# Patient Record
Sex: Female | Born: 1940 | Race: White | Hispanic: No | Smoking: Never smoker
Health system: Southern US, Community
[De-identification: ages and names within clinical notes are randomized; demographics above are authoritative.]

## PROBLEM LIST (undated history)

## (undated) DIAGNOSIS — E039 Hypothyroidism, unspecified: Secondary | ICD-10-CM

## (undated) DIAGNOSIS — G629 Polyneuropathy, unspecified: Secondary | ICD-10-CM

## (undated) DIAGNOSIS — H9190 Unspecified hearing loss, unspecified ear: Secondary | ICD-10-CM

## (undated) DIAGNOSIS — D649 Anemia, unspecified: Secondary | ICD-10-CM

## (undated) DIAGNOSIS — E7800 Pure hypercholesterolemia, unspecified: Secondary | ICD-10-CM

## (undated) DIAGNOSIS — J939 Pneumothorax, unspecified: Secondary | ICD-10-CM

## (undated) DIAGNOSIS — I890 Lymphedema, not elsewhere classified: Secondary | ICD-10-CM

## (undated) DIAGNOSIS — B084 Enteroviral vesicular stomatitis with exanthem: Secondary | ICD-10-CM

## (undated) DIAGNOSIS — C50919 Malignant neoplasm of unspecified site of unspecified female breast: Secondary | ICD-10-CM

## (undated) DIAGNOSIS — I319 Disease of pericardium, unspecified: Secondary | ICD-10-CM

## (undated) DIAGNOSIS — M8580 Other specified disorders of bone density and structure, unspecified site: Secondary | ICD-10-CM

## (undated) DIAGNOSIS — I2510 Atherosclerotic heart disease of native coronary artery without angina pectoris: Secondary | ICD-10-CM

## (undated) DIAGNOSIS — E538 Deficiency of other specified B group vitamins: Secondary | ICD-10-CM

## (undated) DIAGNOSIS — I219 Acute myocardial infarction, unspecified: Secondary | ICD-10-CM

## (undated) DIAGNOSIS — J42 Unspecified chronic bronchitis: Secondary | ICD-10-CM

## (undated) DIAGNOSIS — H269 Unspecified cataract: Secondary | ICD-10-CM

## (undated) DIAGNOSIS — C801 Malignant (primary) neoplasm, unspecified: Secondary | ICD-10-CM

## (undated) DIAGNOSIS — E7841 Elevated Lipoprotein(a): Secondary | ICD-10-CM

## (undated) DIAGNOSIS — S42401A Unspecified fracture of lower end of right humerus, initial encounter for closed fracture: Secondary | ICD-10-CM

## (undated) DIAGNOSIS — N189 Chronic kidney disease, unspecified: Secondary | ICD-10-CM

## (undated) DIAGNOSIS — E079 Disorder of thyroid, unspecified: Secondary | ICD-10-CM

## (undated) HISTORY — DX: Pure hypercholesterolemia, unspecified: E78.00

## (undated) HISTORY — PX: OTHER SURGICAL HISTORY: SHX169

## (undated) HISTORY — DX: Lymphedema, not elsewhere classified: I89.0

## (undated) HISTORY — DX: Disease of pericardium, unspecified: I31.9

## (undated) HISTORY — DX: Unspecified chronic bronchitis: J42

## (undated) HISTORY — DX: Hypothyroidism, unspecified: E03.9

## (undated) HISTORY — PX: TONSILLECTOMY AND ADENOIDECTOMY: SUR1326

## (undated) HISTORY — DX: Other specified disorders of bone density and structure, unspecified site: M85.80

## (undated) HISTORY — DX: Chronic kidney disease, unspecified: N18.9

## (undated) HISTORY — DX: Pneumothorax, unspecified: J93.9

## (undated) HISTORY — PX: CARDIAC CATHETERIZATION: SHX172

## (undated) HISTORY — DX: Unspecified hearing loss, unspecified ear: H91.90

## (undated) HISTORY — DX: Disorder of thyroid, unspecified: E07.9

## (undated) HISTORY — DX: Unspecified cataract: H26.9

## (undated) HISTORY — DX: Enteroviral vesicular stomatitis with exanthem: B08.4

## (undated) HISTORY — DX: Deficiency of other specified B group vitamins: E53.8

## (undated) HISTORY — DX: Anemia, unspecified: D64.9

## (undated) HISTORY — DX: Malignant neoplasm of unspecified site of unspecified female breast: C50.919

---

## 1997-08-07 ENCOUNTER — Other Ambulatory Visit: Admission: RE | Admit: 1997-08-07 | Discharge: 1997-08-07 | Payer: Self-pay | Admitting: Internal Medicine

## 1997-09-18 ENCOUNTER — Other Ambulatory Visit: Admission: RE | Admit: 1997-09-18 | Discharge: 1997-09-18 | Payer: Self-pay | Admitting: Internal Medicine

## 1997-12-31 ENCOUNTER — Other Ambulatory Visit: Admission: RE | Admit: 1997-12-31 | Discharge: 1997-12-31 | Payer: Self-pay | Admitting: *Deleted

## 1999-01-06 DIAGNOSIS — I319 Disease of pericardium, unspecified: Secondary | ICD-10-CM

## 1999-01-06 DIAGNOSIS — J939 Pneumothorax, unspecified: Secondary | ICD-10-CM

## 1999-01-06 HISTORY — DX: Pneumothorax, unspecified: J93.9

## 1999-01-06 HISTORY — DX: Disease of pericardium, unspecified: I31.9

## 1999-01-07 ENCOUNTER — Observation Stay (HOSPITAL_COMMUNITY): Admission: EM | Admit: 1999-01-07 | Discharge: 1999-01-08 | Payer: Self-pay | Admitting: Emergency Medicine

## 1999-01-07 ENCOUNTER — Encounter: Payer: Self-pay | Admitting: Internal Medicine

## 1999-02-01 ENCOUNTER — Inpatient Hospital Stay (HOSPITAL_COMMUNITY): Admission: EM | Admit: 1999-02-01 | Discharge: 1999-02-05 | Payer: Self-pay | Admitting: Internal Medicine

## 1999-02-01 ENCOUNTER — Encounter: Payer: Self-pay | Admitting: Internal Medicine

## 1999-02-03 ENCOUNTER — Encounter: Payer: Self-pay | Admitting: Internal Medicine

## 1999-03-12 ENCOUNTER — Other Ambulatory Visit: Admission: RE | Admit: 1999-03-12 | Discharge: 1999-03-12 | Payer: Self-pay | Admitting: *Deleted

## 2000-03-14 ENCOUNTER — Other Ambulatory Visit: Admission: RE | Admit: 2000-03-14 | Discharge: 2000-03-14 | Payer: Self-pay | Admitting: *Deleted

## 2000-04-25 ENCOUNTER — Encounter: Admission: RE | Admit: 2000-04-25 | Discharge: 2000-04-25 | Payer: Self-pay | Admitting: *Deleted

## 2001-04-19 ENCOUNTER — Other Ambulatory Visit: Admission: RE | Admit: 2001-04-19 | Discharge: 2001-04-19 | Payer: Self-pay | Admitting: Obstetrics and Gynecology

## 2001-05-17 ENCOUNTER — Encounter: Payer: Self-pay | Admitting: Internal Medicine

## 2001-05-17 ENCOUNTER — Encounter: Admission: RE | Admit: 2001-05-17 | Discharge: 2001-05-17 | Payer: Self-pay | Admitting: Internal Medicine

## 2001-05-23 ENCOUNTER — Encounter: Payer: Self-pay | Admitting: Internal Medicine

## 2001-05-23 ENCOUNTER — Encounter: Admission: RE | Admit: 2001-05-23 | Discharge: 2001-05-23 | Payer: Self-pay | Admitting: Internal Medicine

## 2002-07-02 ENCOUNTER — Other Ambulatory Visit: Admission: RE | Admit: 2002-07-02 | Discharge: 2002-07-02 | Payer: Self-pay | Admitting: Obstetrics and Gynecology

## 2002-07-02 ENCOUNTER — Encounter: Payer: Self-pay | Admitting: Internal Medicine

## 2002-07-02 ENCOUNTER — Encounter: Admission: RE | Admit: 2002-07-02 | Discharge: 2002-07-02 | Payer: Self-pay | Admitting: Internal Medicine

## 2003-06-03 ENCOUNTER — Encounter: Admission: RE | Admit: 2003-06-03 | Discharge: 2003-06-03 | Payer: Self-pay | Admitting: Internal Medicine

## 2003-07-09 ENCOUNTER — Encounter: Admission: RE | Admit: 2003-07-09 | Discharge: 2003-07-09 | Payer: Self-pay | Admitting: Internal Medicine

## 2003-08-13 ENCOUNTER — Other Ambulatory Visit: Admission: RE | Admit: 2003-08-13 | Discharge: 2003-08-13 | Payer: Self-pay | Admitting: Obstetrics and Gynecology

## 2004-01-28 ENCOUNTER — Ambulatory Visit: Payer: Self-pay | Admitting: Internal Medicine

## 2004-02-20 ENCOUNTER — Ambulatory Visit: Payer: Self-pay | Admitting: *Deleted

## 2004-03-31 ENCOUNTER — Ambulatory Visit: Payer: Self-pay | Admitting: Internal Medicine

## 2004-10-01 ENCOUNTER — Encounter: Admission: RE | Admit: 2004-10-01 | Discharge: 2004-10-01 | Payer: Self-pay | Admitting: Internal Medicine

## 2004-10-05 ENCOUNTER — Ambulatory Visit: Payer: Self-pay | Admitting: Internal Medicine

## 2004-10-06 ENCOUNTER — Ambulatory Visit: Payer: Self-pay | Admitting: Internal Medicine

## 2004-10-14 ENCOUNTER — Ambulatory Visit: Payer: Self-pay | Admitting: Internal Medicine

## 2005-01-20 ENCOUNTER — Ambulatory Visit: Payer: Self-pay | Admitting: Internal Medicine

## 2005-01-21 ENCOUNTER — Ambulatory Visit: Payer: Self-pay | Admitting: Internal Medicine

## 2005-01-28 ENCOUNTER — Ambulatory Visit: Payer: Self-pay | Admitting: Gastroenterology

## 2005-02-04 ENCOUNTER — Ambulatory Visit: Payer: Self-pay | Admitting: Gastroenterology

## 2005-02-08 ENCOUNTER — Ambulatory Visit: Payer: Self-pay | Admitting: Gastroenterology

## 2005-02-08 ENCOUNTER — Encounter (INDEPENDENT_AMBULATORY_CARE_PROVIDER_SITE_OTHER): Payer: Self-pay | Admitting: Specialist

## 2005-03-08 ENCOUNTER — Ambulatory Visit: Payer: Self-pay | Admitting: Internal Medicine

## 2005-06-29 ENCOUNTER — Ambulatory Visit: Payer: Self-pay | Admitting: Internal Medicine

## 2005-08-03 ENCOUNTER — Ambulatory Visit: Payer: Self-pay | Admitting: Internal Medicine

## 2006-01-12 ENCOUNTER — Ambulatory Visit: Payer: Self-pay | Admitting: Internal Medicine

## 2006-03-07 DIAGNOSIS — E039 Hypothyroidism, unspecified: Secondary | ICD-10-CM

## 2006-03-07 HISTORY — DX: Hypothyroidism, unspecified: E03.9

## 2006-03-24 ENCOUNTER — Ambulatory Visit: Payer: Self-pay | Admitting: Internal Medicine

## 2006-10-12 ENCOUNTER — Telehealth (INDEPENDENT_AMBULATORY_CARE_PROVIDER_SITE_OTHER): Payer: Self-pay | Admitting: *Deleted

## 2006-10-20 ENCOUNTER — Ambulatory Visit: Payer: Self-pay | Admitting: Internal Medicine

## 2006-10-28 LAB — CONVERTED CEMR LAB
AST: 16 units/L (ref 0–37)
Cholesterol: 239 mg/dL (ref 0–200)
Total CHOL/HDL Ratio: 4.8
Triglycerides: 99 mg/dL (ref 0–149)

## 2006-10-30 ENCOUNTER — Encounter (INDEPENDENT_AMBULATORY_CARE_PROVIDER_SITE_OTHER): Payer: Self-pay | Admitting: *Deleted

## 2006-10-30 ENCOUNTER — Telehealth (INDEPENDENT_AMBULATORY_CARE_PROVIDER_SITE_OTHER): Payer: Self-pay | Admitting: *Deleted

## 2006-10-30 ENCOUNTER — Ambulatory Visit: Payer: Self-pay | Admitting: Internal Medicine

## 2006-10-30 LAB — CONVERTED CEMR LAB
HDL goal, serum: 40 mg/dL
LDL Goal: 160 mg/dL

## 2006-12-26 ENCOUNTER — Ambulatory Visit: Payer: Self-pay | Admitting: Internal Medicine

## 2006-12-26 LAB — CONVERTED CEMR LAB
Direct LDL: 135.4 mg/dL
HDL: 55.7 mg/dL (ref >39.0)
Total Bilirubin: 1 mg/dL (ref 0.3–1.2)
Total CK: 62 units/L (ref 7–177)
Total Protein: 6.9 g/dL (ref 6.0–8.3)
Triglycerides: 88 mg/dL (ref 0–149)

## 2006-12-29 ENCOUNTER — Ambulatory Visit: Payer: Self-pay | Admitting: Internal Medicine

## 2006-12-29 DIAGNOSIS — E782 Mixed hyperlipidemia: Secondary | ICD-10-CM

## 2006-12-29 DIAGNOSIS — E7800 Pure hypercholesterolemia, unspecified: Secondary | ICD-10-CM | POA: Insufficient documentation

## 2007-05-31 ENCOUNTER — Encounter: Payer: Self-pay | Admitting: Internal Medicine

## 2007-10-03 ENCOUNTER — Ambulatory Visit: Payer: Self-pay | Admitting: Internal Medicine

## 2007-10-03 DIAGNOSIS — M25469 Effusion, unspecified knee: Secondary | ICD-10-CM | POA: Insufficient documentation

## 2007-10-03 DIAGNOSIS — S99919A Unspecified injury of unspecified ankle, initial encounter: Secondary | ICD-10-CM

## 2007-10-03 DIAGNOSIS — S8990XA Unspecified injury of unspecified lower leg, initial encounter: Secondary | ICD-10-CM | POA: Insufficient documentation

## 2007-10-03 DIAGNOSIS — S99929A Unspecified injury of unspecified foot, initial encounter: Secondary | ICD-10-CM

## 2007-10-04 ENCOUNTER — Telehealth (INDEPENDENT_AMBULATORY_CARE_PROVIDER_SITE_OTHER): Payer: Self-pay | Admitting: *Deleted

## 2007-11-06 ENCOUNTER — Encounter: Admission: RE | Admit: 2007-11-06 | Discharge: 2007-11-06 | Payer: Self-pay | Admitting: Internal Medicine

## 2008-03-07 ENCOUNTER — Observation Stay (HOSPITAL_COMMUNITY): Admission: EM | Admit: 2008-03-07 | Discharge: 2008-03-08 | Payer: Self-pay | Admitting: Internal Medicine

## 2008-03-07 ENCOUNTER — Encounter: Payer: Self-pay | Admitting: Emergency Medicine

## 2008-03-07 ENCOUNTER — Ambulatory Visit: Payer: Self-pay | Admitting: Interventional Radiology

## 2010-03-27 ENCOUNTER — Encounter: Payer: Self-pay | Admitting: Internal Medicine

## 2010-03-28 ENCOUNTER — Encounter: Payer: Self-pay | Admitting: Internal Medicine

## 2010-06-21 LAB — CBC
HCT: 36.2 % (ref 36.0–46.0)
HCT: 37.2 % (ref 36.0–46.0)
MCHC: 33.3 g/dL (ref 30.0–36.0)
MCV: 94.2 fL (ref 78.0–100.0)
MCV: 96 fL (ref 78.0–100.0)
Platelets: 231 10*3/uL (ref 150–400)
Platelets: 237 10*3/uL (ref 150–400)
Platelets: 248 10*3/uL (ref 150–400)
RDW: 12.6 % (ref 11.5–15.5)
WBC: 8.3 10*3/uL (ref 4.0–10.5)

## 2010-06-21 LAB — BASIC METABOLIC PANEL
BUN: 15 mg/dL (ref 6–23)
BUN: 16 mg/dL (ref 6–23)
CO2: 28 mEq/L (ref 19–32)
Calcium: 9.4 mg/dL (ref 8.4–10.5)
Chloride: 107 mEq/L (ref 96–112)
Creatinine, Ser: 0.62 mg/dL (ref 0.4–1.2)
GFR calc non Af Amer: >60 mL/min (ref >60)
Glucose, Bld: 106 mg/dL — ABNORMAL HIGH (ref 70–99)
Potassium: 3.4 mEq/L — ABNORMAL LOW (ref 3.5–5.1)

## 2010-06-21 LAB — C-REACTIVE PROTEIN: CRP: 0.2 mg/dL — ABNORMAL LOW (ref <0.6)

## 2010-06-21 LAB — DIFFERENTIAL
Basophils Absolute: 0 10*3/uL (ref 0.0–0.1)
Basophils Absolute: 0 10*3/uL (ref 0.0–0.1)
Basophils Relative: 0 % (ref 0–1)
Eosinophils Absolute: 0.1 10*3/uL (ref 0.0–0.7)
Eosinophils Relative: 2 % (ref 0–5)
Lymphocytes Relative: 28 % (ref 12–46)
Lymphocytes Relative: 31 % (ref 12–46)
Lymphs Abs: 2.6 10*3/uL (ref 0.7–4.0)
Monocytes Relative: 7 % (ref 3–12)
Neutro Abs: 4.7 10*3/uL (ref 1.7–7.7)
Neutrophils Relative %: 59 % (ref 43–77)
Neutrophils Relative %: 64 % (ref 43–77)

## 2010-06-21 LAB — COMPREHENSIVE METABOLIC PANEL
Alkaline Phosphatase: 71 U/L (ref 39–117)
BUN: 14 mg/dL (ref 6–23)
CO2: 27 mEq/L (ref 19–32)
Chloride: 103 mEq/L (ref 96–112)
GFR calc non Af Amer: >60 mL/min (ref >60)
Glucose, Bld: 98 mg/dL (ref 70–99)
Potassium: 3.7 mEq/L (ref 3.5–5.1)
Total Bilirubin: 1 mg/dL (ref 0.3–1.2)
Total Protein: 6.1 g/dL (ref 6.0–8.3)

## 2010-06-21 LAB — T4, FREE: Free T4: 1.33 ng/dL (ref 0.89–1.80)

## 2010-06-21 LAB — PROTIME-INR: INR: 0.9 (ref 0.00–1.49)

## 2010-06-21 LAB — SEDIMENTATION RATE: Sed Rate: 7 mm/hr (ref 0–22)

## 2010-06-21 LAB — CK: Total CK: 31 U/L (ref 7–177)

## 2010-06-21 LAB — D-DIMER, QUANTITATIVE: D-Dimer, Quant: 0.33 ug/mL-FEU (ref 0.00–0.48)

## 2010-06-21 LAB — POCT CARDIAC MARKERS

## 2010-06-21 LAB — TROPONIN I: Troponin I: 0.01 ng/mL (ref 0.00–0.06)

## 2010-07-20 NOTE — H&P (Signed)
Jessica [name], MORAD NO.:  [id number]   MEDICAL RECORD NO.:  [id number]          PATIENT TYPE:  INP   LOCATION:                               FACILITY:  [facility]   PHYSICIAN:  Renee Ramus, MD       DATE OF BIRTH:  1940-11-25   DATE OF ADMISSION:  03/07/2008  DATE OF DISCHARGE:  03/08/2008                              HISTORY & PHYSICAL   HISTORY OF PRESENT ILLNESS:  The patient is a 70 year old female who  experienced a brief syncopal episode today lasting less than 2 minutes.  The patient had no preceding symptoms.  The patient did have syncopal  episode approximately 10 years ago when she had episode of pericarditis;  however, at that time the syncopal episodes were associated with cardiac  chest pain.  She denies any pain at this time, denies any associated  diaphoresis, nausea, vomiting, or tachycardia.  She said that it was,  however, accompanied by weakness.  She denies any recent history of  diarrhea or constipation.  She denies any history of dehydration.  She  denies any preceding or prodromal syndromes.  She denies any  exacerbating or relieving factors.  This patient will be admitted for  further evaluation and treatment.   PAST MEDICAL HISTORY:  1. Hypothyroid.  2. History of pericarditis with collapsed lung, now resolved.   SOCIAL HISTORY:  The patient denies tobacco and reports having 1 or 2  glasses or wine week.   FAMILY HISTORY:  Not available.   REVIEW OF SYSTEMS:  All other comprehensive review of systems are  negative.   MEDICATIONS CURRENTLY:  Levothyroxine 112 mcg p.o. daily.   ALLERGIES:  NKDA.   PHYSICAL EXAMINATION:  GENERAL:  This is a well-developed, well-  nourished white female, currently in no apparent distress.  VITAL SIGNS:  Pulse 56, respiratory 12, blood pressure 106/56, 96% sat  on room air, and temperature is 98.  HEENT:  No jugular venous distention or lymphadenopathy.  Oropharynx is  clear.  Mucous membranes pink  and moist.  TMs clear bilaterally.  Pupils  equal and reactive to light and accommodation.  Extraocular muscles are  intact.  CARDIOVASCULAR:  She has a regular rate and rhythm albeit brady, but no  evidence of murmurs, rubs or gallops.  She has normal S1 and S2, and  there is no evidence of pericardial friction rub.  PULMONARY:  Lungs are clear to auscultation bilaterally.  ABDOMEN:  Soft, nontender, nondistended without hepatosplenomegaly.  Bowel sounds are present.  She has no rebound or guarding.  EXTREMITIES:  She has no clubbing, cyanosis or edema.  She has good  peripheral pulses in dorsalis pedis and radial artery.  She is able to  move all extremities.  NEUROLOGIC:  Cranial nerves II through XII are grossly intact.  She has  no focal neurological deficits.   LABORATORY DATA AND STUDIES:  1. EKG is negative.  2. Chest x-ray shows mild hyperinflated lungs, but no evidence of      acute cardiopulmonary disease.  White count 7.48, H and H 12.5 and  36, MCV 94, platelets 231.  Sodium 140, potassium 3.9, chloride      104, bicarb 27, BUN 16, creatinine 0.7, glucose 106, INR 0.9.  CK      and troponin I are negative.   ASSESSMENT AND PLAN:  1. Syncope.  Much concerned about the lack of a prodromal experience      prior to her syncopal episode.  This suggests strongly that this is      some kind of dysrhythmia.  We will recheck EKG.  We will check      echocardiogram.  We will recheck cardiac enzymes.  We will check      orthostatics in a.m., keep the patient on telemetry and observe for      bradydysrhythmia.  We will also check D-dimer, although I believe      that is a low probability.  We will also check BNP, ESR, and CRP to      evaluate for a surreptitious inflammatory infectious state;      however, primary and the differential at this point is a      bradydysrhythmia.  2. Hypothyroid.  We will check TSH and free T4 and continue      levothyroxine currently.  3.  Disposition.  The patient is full code.  H and P was constructed by      reviewing past medical history, conferring with the emergency      medical room physician and reviewing the emergency medical record.   TIME SPENT:  One hour.      Renee Ramus, MD  Electronically Signed     JF/MEDQ  D:  03/07/2008  T:  03/07/2008  Job:  [id number]

## 2010-07-20 NOTE — Discharge Summary (Signed)
[name], KLIEBERT NO.:  [id number]   MEDICAL RECORD NO.:  [id number]          PATIENT TYPE:  INP   LOCATION:  4732                         FACILITY:  [facility]   PHYSICIAN:  Lonia Blood, M.D.DATE OF BIRTH:  16-Jan-1941   DATE OF ADMISSION:  03/07/2008  DATE OF DISCHARGE:  03/08/2008                               DISCHARGE SUMMARY   PRIMARY CARE PHYSICIAN:  Massie Maroon, M.D.   DISCHARGE DIAGNOSES:  1. Syncope.  2. Hypothyroidism.  3. Remote history of pericarditis.  4. Remote history of spontaneous pneumothorax.   DISCHARGE MEDICATIONS:  Synthroid 112 mcg p.o. daily   FOLLOW UP:  The patient is instructed to follow up with Dr. Pearson Grippe in  3-5 days.  She is advised to avoid driving until she follows up with Dr.  Selena Batten and is cleared as per his discretion and the followup  recommendation is made that echocardiogram be considered.  Further a  loop monitor could be considered as well.   PROCEDURES:  None.   CONSULTATIONS:  None.   HISTORY OF PRESENT ILLNESS:  [name] is a very pleasant 70-  year-old female who was in her usual state of health until the day prior  to her admission.  She was attending a party.  She had one standard  glass of wine during the party.  She is not a daily drinker.  After  consuming of her glass of wine, she experienced transient symptom of  nausea.  She sat down and the symptoms improved.  She then stood and was  mingling at the party  when she began to feel nauseous again.  Again she  sat and the symptoms resolved.  A second time she stood from her episode  and began to socialize yet again when her symptoms of nausea recurred.  She then sat down and apparently lost consciousness as appreciated by  the other party attendees.  She was transferred to [facility] emergency  room for evaluation.  If an EKG was done there, it is not available to  me.  I see no evidence of that.  After diagnosis of syncope was made,  the  patient was transferred to [facility] for inpatient stay.   While at [facility], electrolytes had been confirmed to be normal.  Renal  function is normal.  LFTs are normal.  Coagulation studies are normal.  A CBC is unrevealing.  BNP is normal.  Sed rate is 7.  D-dimer is  negative.  Cardiac enzymes have been negative.  CT scan of the head was  done and was unrevealing.  A TSH was accomplished and was 2.8.  Orthostatic blood pressures were obtained and the patient was actually  found to suffer an increase of blood pressure when standing.  The  patient does appear to be mildly dehydrated with a systolic blood  pressure of 90 while lying down and a systolic of 115 when standing.  During the patient's hospital stay, a heart rate dipping as low at 55  has been appreciated.  The patient has been asymptomatic with this  nonetheless.  On  the follow-up from the patient's admission, she has no  complaints whatsoever.  She is requesting that she be allowed to go  home.  It is important to note that she had no chest pain.  No  substernal chest pressure and no shortness of breath or diaphoresis or  palpitations with her symptoms.  It is felt that further inpatient stay  is likely to be unfruitful in evaluating the patient's syncope.  She has  some elements better suggestive of orthostatic syncope.  There is other  evidence suggesting a neurally mediated/vasovagal syncope.  Finally, the  possibility of a sick sinus syndrome/bradycardia associated syncope is  considered that the patient has not had severe bradycardia during her  stay.  The patient is felt to be stable at the present time for  discharge.  She has been warned of signs of unstable angina pectoris or  pulmonary embolism or CVA and advised to return via EMS immediately to  the ER should these symptoms develop.  She is further advised not to  drive until she is cleared by her primary care physician.  She is  advised to follow-up in short course  with Dr. Selena Batten in 3-5 days for  further evaluation to be considered as noted above.      Lonia Blood, M.D.  Electronically Signed     JTM/MEDQ  D:  03/08/2008  T:  03/08/2008  Job:  [id number]   cc:   Massie Maroon, MD

## 2011-11-18 ENCOUNTER — Encounter: Payer: Self-pay | Admitting: Gastroenterology

## 2012-05-09 ENCOUNTER — Other Ambulatory Visit: Payer: Self-pay | Admitting: Internal Medicine

## 2012-05-09 DIAGNOSIS — R531 Weakness: Secondary | ICD-10-CM

## 2012-05-19 ENCOUNTER — Ambulatory Visit
Admission: RE | Admit: 2012-05-19 | Discharge: 2012-05-19 | Disposition: A | Discharge status: Home / Self Care | Payer: Medicare Other | Source: Ambulatory Visit | Attending: Internal Medicine | Admitting: Internal Medicine

## 2012-05-19 DIAGNOSIS — R531 Weakness: Secondary | ICD-10-CM

## 2012-08-07 ENCOUNTER — Ambulatory Visit (INDEPENDENT_AMBULATORY_CARE_PROVIDER_SITE_OTHER): Payer: Medicare Other | Admitting: Neurology

## 2012-08-07 ENCOUNTER — Encounter: Payer: Self-pay | Admitting: Neurology

## 2012-08-07 ENCOUNTER — Other Ambulatory Visit: Payer: Self-pay | Admitting: *Deleted

## 2012-08-07 VITALS — BP 112/78 | HR 62 | Ht 62.0 in | Wt 152.2 lb

## 2012-08-07 DIAGNOSIS — R200 Anesthesia of skin: Secondary | ICD-10-CM | POA: Insufficient documentation

## 2012-08-07 DIAGNOSIS — S99929A Unspecified injury of unspecified foot, initial encounter: Secondary | ICD-10-CM

## 2012-08-07 DIAGNOSIS — E782 Mixed hyperlipidemia: Secondary | ICD-10-CM

## 2012-08-07 DIAGNOSIS — R209 Unspecified disturbances of skin sensation: Secondary | ICD-10-CM

## 2012-08-07 NOTE — Progress Notes (Signed)
History of present illness: Jessica [name] is a 72 years old right-handed Caucasian female, referred by her primary care physician Dr. Pearson Grippe for evaluation of bilateral feet paresthesia   She had a past medical history of hypothyroidism, hyperlipidemia, pericarditis  During her most recent followup with her primary care doctor Dr. Selena Batten she was noticed to have decreased vibratory sensation at toes, she also reported few months history of bilateral toes numbness, intermittent tingling, seems to ascend to her foot sometimes, she denies pain, no weakness, she denies back pain, no shotting pain to bilateral lower extremity, mild unbalanced gait, no bowel bladder incontinence  She denies bilateral upper extremity paresthesia, there was mild low vitamin B12, but I do not have laboratory evaluation, she was put on Metnax, which seems to be helpful, her toes felt more flexible, less rigid,   Review of Systems  Out of a complete 14 system review, the patient complains of only the following symptoms, and all other reviewed systems are negative.   Constitutional:   N/A Cardiovascular:  N/A Ear/Nose/Throat:  N/A Skin: N/A Eyes: N/A Respiratory: N/A Gastroitestinal: N/A    Hematology/Lymphatic:  N/A Endocrine:  N/A Musculoskeletal:N/A Allergy/Immunology: N/A Neurological: numbness, balance Psychiatric:    N/A  PHYSICAL EXAMINATOINS:  Generalized: In no acute distress  Neck: Supple, no carotid bruits   Cardiac: Regular rate rhythm  Pulmonary: Clear to auscultation bilaterally  Musculoskeletal: No deformity  Neurological examination  Mentation: Alert oriented to time, place, history taking, and causual conversation  Cranial nerve II-XII: Pupils were equal round reactive to light extraocular movements were full, visual field were full on confrontational test. facial sensation and strength were normal. hearing was intact to finger rubbing bilaterally. Uvula tongue midline.  head turning and  shoulder shrug and were normal and symmetric.Tongue protrusion into cheek strength was normal.  Motor: normal tone, bulk and strength.  Sensory: mildly decreased fine touch, pinprick to mid foot, preserved vibratory sensation lasting for 5 seconds, and proprioception at toes.  Coordination: Normal finger to nose, heel-to-shin bilaterally there was no truncal ataxia  Gait: Rising up from seated position without assistance, normal stance, without trunk ataxia, moderate stride, good arm swing, smooth turning, able to perform tiptoe, and heel walking without difficulty.   Romberg signs: Negative  Deep tendon reflexes: Brachioradialis 2/2, biceps 2/2, triceps 2/2, patellar 2/2, Achilles 2/2, plantar responses were flexor bilaterally.  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

## 2013-01-06 ENCOUNTER — Ambulatory Visit (INDEPENDENT_AMBULATORY_CARE_PROVIDER_SITE_OTHER): Payer: Medicare Other | Admitting: Family Medicine

## 2013-01-06 VITALS — BP 94/68 | HR 75 | Temp 97.5°F | Resp 16 | Ht 64.0 in | Wt 150.0 lb

## 2013-01-06 DIAGNOSIS — L0291 Cutaneous abscess, unspecified: Secondary | ICD-10-CM

## 2013-01-06 DIAGNOSIS — W5501XA Bitten by cat, initial encounter: Secondary | ICD-10-CM

## 2013-01-06 DIAGNOSIS — T148XXA Other injury of unspecified body region, initial encounter: Secondary | ICD-10-CM

## 2013-01-06 DIAGNOSIS — L0390 Cellulitis, unspecified: Secondary | ICD-10-CM

## 2013-01-06 MED ORDER — AMOXICILLIN-POT CLAVULANATE 875-125 MG PO TABS: 1.0000 | ORAL_TABLET | Freq: Two times a day (BID) | ORAL | Status: DC

## 2013-01-06 NOTE — Progress Notes (Signed)
This chart was scribed for Elvina Sidle, MD by Caryn Bee, Medical Scribe. This patient was seen in Room/bed 2 and the patient's care was started at 8:46 AM.  Subjective:    Patient ID: Jessica [name], female    DOB: 11-28-1940, 72 y.o.   MRN: [id number]  HPI HPI Comments: Jessica [name] is a 72 y.o. female who presents to [facility] complaining of cat bite to her right hand that occurred on Friday morning. Pt states she has associated swelling. She has been soaking her hand in epsom salts. Pt is unsure if her Tetanus is UTD, she will call her PCP and check. She is flying to [place] next Wednesday for her son's wedding. He works in [employer]. 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 She states that she would go back to visit, but not to live.   Review of Systems  Skin: Positive for color change (Erythema) and wound (Cat bite right hand). Negative for rash.   Past Surgical History  Procedure Laterality Date  . Tonsillectomy and adenoidectomy  1952?  Marland Kitchen Birthmark       removed,left leg  . Patella fracture surgery Right 2010    after fall   History   Social History  . Marital Status: Divorced    Spouse Name: N/A    Number of Children: 1  . Years of Education: N/A   Occupational History  . retired    Social History Main Topics  . Smoking status: Never Smoker   . Smokeless tobacco: Not on file  . Alcohol Use: Yes     Comment: occas.  . Drug Use: No  . Sexual Activity: Not on file   Other Topics Concern  . Not on file   Social History Narrative   She lives alone, retired, has one child.  No smoke, drink occasionally   Past Medical History  Diagnosis Date  . High cholesterol   . Thyroid disorder   . Pericarditis 01/1999  . Pneumothorax 01/1999  . Osteopenia   . Vitamin B 12 deficiency    Family History  Problem Relation Age of Onset  . Diabetes Mother   . Asthma  Mother   . Ataxia Father   . Cancer - Cervical Sister   . Thyroid disease Sister   . Diabetes Other    Allergies  Allergen Reactions  . Ansaid [Flurbiprofen]     Stomach upset  . Atorvastatin   . Ibuprofen   . Lorazepam   . Nsaids   . Rosuvastatin         Objective:   Physical Exam  Nursing note and vitals reviewed. Constitutional: She is oriented to person, place, and time. She appears well-developed and well-nourished. No distress.  HENT:  Head: Atraumatic.  Neck: Neck supple.  Neurological: She is alert and oriented to person, place, and time.  Skin: Skin is warm and dry. She is not diaphoretic. There is erythema.  Mild edema  Psychiatric: She has a normal mood and affect. Her behavior is normal.      Assessment & Plan:  There were no encounter diagnoses. Cat bite, initial encounter - Plan: amoxicillin-clavulanate (AUGMENTIN) 875-125 MG per tablet  Cellulitis - Plan: amoxicillin-clavulanate (AUGMENTIN) 875-125 MG per tablet  Signed, Elvina Sidle, MD

## 2013-01-12 ENCOUNTER — Ambulatory Visit (INDEPENDENT_AMBULATORY_CARE_PROVIDER_SITE_OTHER): Payer: Medicare Other | Admitting: Family Medicine

## 2013-01-12 VITALS — BP 118/72 | HR 53 | Temp 97.6°F | Resp 18 | Ht 64.0 in | Wt 151.0 lb

## 2013-01-12 DIAGNOSIS — L0390 Cellulitis, unspecified: Secondary | ICD-10-CM

## 2013-01-12 DIAGNOSIS — T148XXA Other injury of unspecified body region, initial encounter: Secondary | ICD-10-CM

## 2013-01-12 DIAGNOSIS — L0291 Cutaneous abscess, unspecified: Secondary | ICD-10-CM

## 2013-01-12 DIAGNOSIS — W5501XA Bitten by cat, initial encounter: Secondary | ICD-10-CM

## 2013-01-12 MED ORDER — AMOXICILLIN-POT CLAVULANATE 875-125 MG PO TABS: 1.0000 | ORAL_TABLET | Freq: Two times a day (BID) | ORAL | Status: DC

## 2013-01-12 NOTE — Progress Notes (Addendum)
Subjective:    Patient ID: Jessica [name], female    DOB: 01-Feb-1941, 72 y.o.   MRN: [id number]  This chart was scribed for Elvina Sidle, MD by Greggory Stallion, Medical Scribe. This patient's care was started at 9:16 AM.  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 Pt was started on Augmentin and took her last pill 2 days ago. She states it is healing well. Pt is planning on a trip to visit her son in [place] in 3 days. She will take an aspirin before her flight to prevent blood clots from forming on the overseas flight.   Patient Active Problem List   Diagnosis Date Noted  . Numbness 08/07/2012  . JOINT EFFUSION, RIGHT KNEE 10/03/2007  . KNEE INJURY, RIGHT 10/03/2007  . HYPERLIPIDEMIA 12/29/2006   Past Medical History  Diagnosis Date  . High cholesterol   . Thyroid disorder   . Pericarditis 01/1999  . Pneumothorax 01/1999  . Osteopenia   . Vitamin B 12 deficiency    Past Surgical History  Procedure Laterality Date  . Tonsillectomy and adenoidectomy  1952?  Marland Kitchen Birthmark       removed,left leg  . Patella fracture surgery Right 2010    after fall   Allergies  Allergen Reactions  . Ansaid [Flurbiprofen]     Stomach upset  . Atorvastatin   . Ibuprofen   . Lorazepam   . Nsaids   . Rosuvastatin    Prior to Admission medications   Medication Sig Start Date End Date Taking? Authorizing Provider  amoxicillin-clavulanate (AUGMENTIN) 875-125 MG per tablet Take 1 tablet by mouth 2 (two) times daily. 01/06/13  Yes Elvina Sidle, MD  aspirin 81 MG tablet Take 81 mg by mouth daily.   Yes Historical Provider, MD  cholecalciferol (VITAMIN D) 1000 UNITS tablet Take 1,000 Units by mouth daily.   Yes Historical Provider, MD  fish oil-omega-3 fatty acids 1000 MG capsule Take 2 g by mouth daily.   Yes Historical Provider, MD  L-Methylfolate-B6-B12 (METANX PO) Take by  mouth 2 (two) times daily.   Yes Historical Provider, MD  levothyroxine (SYNTHROID, LEVOTHROID) 112 MCG tablet  07/31/12  Yes Historical Provider, MD  Misc Natural Products (OSTEO BI-FLEX JOINT SHIELD PO) Take by mouth as directed.   Yes Historical Provider, MD  Red Yeast Rice 600 MG TABS Take by mouth as directed.   Yes Historical Provider, MD   History   Social History  . Marital Status: Divorced    Spouse Name: N/A    Number of Children: 1  . Years of Education: N/A   Occupational History  . retired    Social History Main Topics  . Smoking status: Never Smoker   . Smokeless tobacco: Not on file  . Alcohol Use: Yes     Comment: occas.  . Drug Use: No  . Sexual Activity: Not on file   Other Topics Concern  . Not on file   Social History Narrative   She lives alone, retired, has one child.  No smoke, drink occasionally     Review of Systems  Skin: Positive for wound (well healing).       Objective:   Physical Exam  Constitutional: She is oriented to person, place, and time. She appears well-developed and well-nourished. No distress.  HENT:  Head: Normocephalic and atraumatic.  Eyes: EOM  are normal.  Neck: Neck supple.  Cardiovascular: Normal rate.   Pulmonary/Chest: Effort normal. No respiratory distress.  Musculoskeletal: Normal range of motion.  Wound completely healed. Minimal erythema. Full ROM of hand.   Neurological: She is alert and oriented to person, place, and time.  Skin: Skin is warm and dry.  Psychiatric: She has a normal mood and affect. Her behavior is normal.    Filed Vitals:   01/12/13 0901  BP: 118/72  Pulse: 53  Temp: 97.6 F (36.4 C)  TempSrc: Oral  Resp: 18  Height: 5\' 4"  (1.626 m)  Weight: 151 lb (68.493 kg)  SpO2: 97%       Assessment & Plan:   Cat bite, initial encounter - Plan: amoxicillin-clavulanate (AUGMENTIN) 875-125 MG per tablet  Cellulitis - Plan: amoxicillin-clavulanate (AUGMENTIN) 875-125 MG per  tablet  Signed, Elvina Sidle, MD

## 2013-02-06 ENCOUNTER — Ambulatory Visit: Payer: Medicare Other | Admitting: Neurology

## 2013-04-05 ENCOUNTER — Encounter: Payer: Self-pay | Admitting: Neurology

## 2013-04-05 ENCOUNTER — Ambulatory Visit (INDEPENDENT_AMBULATORY_CARE_PROVIDER_SITE_OTHER): Payer: Medicare Other | Admitting: Neurology

## 2013-04-05 ENCOUNTER — Encounter (INDEPENDENT_AMBULATORY_CARE_PROVIDER_SITE_OTHER): Payer: Self-pay

## 2013-04-05 VITALS — BP 98/65 | HR 61 | Ht 62.0 in | Wt 154.0 lb

## 2013-04-05 DIAGNOSIS — E782 Mixed hyperlipidemia: Secondary | ICD-10-CM

## 2013-04-05 DIAGNOSIS — R200 Anesthesia of skin: Secondary | ICD-10-CM

## 2013-04-05 DIAGNOSIS — R209 Unspecified disturbances of skin sensation: Secondary | ICD-10-CM

## 2013-04-05 NOTE — Progress Notes (Signed)
History of present illness: Jessica [name] is a 73 years old right-handed Caucasian female, referred by her primary care physician Dr. Jani Gravel for evaluation of bilateral feet paresthesia   She had a past medical history of hypothyroidism, hyperlipidemia, pericarditis  During her most recent followup with her primary care doctor Dr. Maudie Mercury in Jan 2014, she was noticed to have decreased vibratory sensation at toes, she also reported few months history of bilateral toes numbness, intermittent tingling, seems to ascend to her foot sometimes, she denies pain, no weakness, she denies back pain, no shotting pain to bilateral lower extremity, mild unbalanced gait, no bowel bladder incontinence  She denies bilateral upper extremity paresthesia, there was mild low vitamin B12, but I do not have laboratory evaluation, she was put on Metnax, which seems to be helpful, her toes felt more flexible, less rigid.  UPDATE Apr 05 2013:  She stopped taking Metnax for a while, her symptoms returned, she denied gait difficulty  Review of Systems  Out of a complete 14 system review, the patient complains of only the following symptoms, and all other reviewed systems are negative.   PHYSICAL EXAMINATOINS:  Generalized: In no acute distress  Neck: Supple, no carotid bruits   Cardiac: Regular rate rhythm  Pulmonary: Clear to auscultation bilaterally  Musculoskeletal: No deformity  Neurological examination  Mentation: Alert oriented to time, place, history taking, and causual conversation  Cranial nerve II-XII: Pupils were equal round reactive to light extraocular movements were full, visual field were full on confrontational test. facial sensation and strength were normal. hearing was intact to finger rubbing bilaterally. Uvula tongue midline.  head turning and shoulder shrug and were normal and symmetric.Tongue protrusion into cheek strength was normal.  Motor: normal tone, bulk and strength.  Sensory: mildly  decreased fine touch, pinprick to mid foot, preserved vibratory sensation and proprioception at toes.  Coordination: Normal finger to nose, heel-to-shin bilaterally there was no truncal ataxia  Gait: Rising up from seated position without assistance, normal stance, without trunk ataxia, moderate stride, good arm swing, smooth turning, able to perform tiptoe, and heel walking without difficulty.   Romberg signs: Negative  Deep tendon reflexes: Brachioradialis 2/2, biceps 2/2, triceps 2/2, patellar 2/2, Achilles 2/2, plantar responses were flexor bilaterally.  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 Continue Mentax 3. RTC prn

## 2013-04-18 ENCOUNTER — Ambulatory Visit (INDEPENDENT_AMBULATORY_CARE_PROVIDER_SITE_OTHER): Payer: Medicare Other | Admitting: Family Medicine

## 2013-04-18 ENCOUNTER — Encounter: Payer: Self-pay | Admitting: Family Medicine

## 2013-04-18 ENCOUNTER — Telehealth: Payer: Self-pay | Admitting: Neurology

## 2013-04-18 VITALS — BP 116/76 | HR 56 | Temp 97.8°F | Resp 16 | Ht 64.0 in | Wt 152.6 lb

## 2013-04-18 DIAGNOSIS — Z1211 Encounter for screening for malignant neoplasm of colon: Secondary | ICD-10-CM

## 2013-04-18 DIAGNOSIS — R531 Weakness: Secondary | ICD-10-CM

## 2013-04-18 DIAGNOSIS — R5383 Other fatigue: Secondary | ICD-10-CM

## 2013-04-18 DIAGNOSIS — G609 Hereditary and idiopathic neuropathy, unspecified: Secondary | ICD-10-CM

## 2013-04-18 DIAGNOSIS — G5793 Unspecified mononeuropathy of bilateral lower limbs: Secondary | ICD-10-CM

## 2013-04-18 DIAGNOSIS — E039 Hypothyroidism, unspecified: Secondary | ICD-10-CM

## 2013-04-18 DIAGNOSIS — Z1212 Encounter for screening for malignant neoplasm of rectum: Secondary | ICD-10-CM

## 2013-04-18 DIAGNOSIS — R5381 Other malaise: Secondary | ICD-10-CM

## 2013-04-18 LAB — BASIC METABOLIC PANEL WITH GFR
BUN: 15 mg/dL (ref 6–23)
CO2: 27 meq/L (ref 19–32)
Calcium: 9.1 mg/dL (ref 8.4–10.5)
Chloride: 105 meq/L (ref 96–112)
Creat: 0.59 mg/dL (ref 0.50–1.10)
Glucose, Bld: 83 mg/dL (ref 70–99)
Potassium: 4.7 meq/L (ref 3.5–5.3)
Sodium: 141 meq/L (ref 135–145)

## 2013-04-18 LAB — CBC WITH DIFFERENTIAL/PLATELET
Basophils Absolute: 0.1 K/uL (ref 0.0–0.1)
Basophils Relative: 1 % (ref 0–1)
Eosinophils Absolute: 0.2 K/uL (ref 0.0–0.7)
Eosinophils Relative: 2 % (ref 0–5)
HCT: 36.5 % (ref 36.0–46.0)
Hemoglobin: 12.1 g/dL (ref 12.0–15.0)
Lymphocytes Relative: 35 % (ref 12–46)
Lymphs Abs: 3 K/uL (ref 0.7–4.0)
MCH: 29.9 pg (ref 26.0–34.0)
MCHC: 33.2 g/dL (ref 30.0–36.0)
MCV: 90.1 fL (ref 78.0–100.0)
Monocytes Absolute: 0.6 K/uL (ref 0.1–1.0)
Monocytes Relative: 7 % (ref 3–12)
Neutro Abs: 4.7 K/uL (ref 1.7–7.7)
Neutrophils Relative %: 55 % (ref 43–77)
Platelets: 309 K/uL (ref 150–400)
RBC: 4.05 MIL/uL (ref 3.87–5.11)
RDW: 14.7 % (ref 11.5–15.5)
WBC: 8.5 K/uL (ref 4.0–10.5)

## 2013-04-18 NOTE — Telephone Encounter (Signed)
Pt called this morning and asked for her BP reading from her visit on 04-05-13 with Dr. Krista Blue.  She is going to see a new doctor at 11:00 am today and would like to give that doctor the information.  She asked if she could receive a call back as soon as possible since her appointment is today.  Thank you

## 2013-04-18 NOTE — Telephone Encounter (Signed)
Called and shared B/P readings from last OV notes with patient and mailed LOV notes per patient's request.

## 2013-04-18 NOTE — Patient Instructions (Addendum)
Dehydration, Elderly Dehydration is when you lose more fluids from the body than you take in. Vital organs such as the kidneys, brain, and heart cannot function without a proper amount of fluids and salt. Any loss of fluids from the body can cause dehydration.  Older adults are at a higher risk of dehydration than younger adults. As we age, our bodies are less able to conserve water and do not respond to temperature changes as well. Also, older adults do not become thirsty as easily or quickly. Because of this, older adults often do not realize they need to increase fluids to avoid dehydration.  CAUSES   Vomiting.  Diarrhea.  Excessive sweating.  Excessive urination.  Fever.  Certain medicines, such as blood pressure medicines called diuretics.  Poorly controlled blood sugars. SIGNS AND SYMPTOMS  Mild dehydration:  Thirst.  Dry lips.  Slightly dry mouth. Moderate dehydration:  Very dry mouth.  Sunken eyes.  Skin does not bounce back quickly when lightly pinched and released.  Dark urine and decreased urine production.  Decreased tear production.  Headache. Severe dehydration:  Very dry mouth.  Extreme thirst.  Rapid, weak pulse (more than 100 beats per minute at rest).  Cold hands and feet.  Not able to sweat in spite of heat.  Rapid breathing.  Blue lips.  Confusion and lethargy.  Difficulty being awakened.  Minimal urine production.  No tears. DIAGNOSIS  Your health care provider will diagnose dehydration based on your symptoms and your exam. Blood and urine tests will help confirm the diagnosis. The diagnostic evaluation should also identify the cause of dehydration. TREATMENT  Treatment of mild or moderate dehydration can often be done at home by increasing the amount of fluids that you drink. It is best to drink small amounts of fluid more often. Drinking too much at one time can make vomiting worse. Severe dehydration needs to be treated at the  hospital. You may be given IV fluids that contain water and electrolytes. HOME CARE INSTRUCTIONS   Ask your health care provider about specific rehydration instructions.  Drink enough fluids to keep your urine clear or pale yellow.  Drink small amounts frequently if you have nausea and vomiting.  Eat as you normally do.  Avoid:  Foods or drinks high in sugar.  Carbonated drinks.  Juice.  Extremely hot or cold fluids.  Drinks with caffeine.  Fatty, greasy foods.  Alcohol.  Tobacco.  Overeating.  Gelatin desserts.  Wash your hands well to avoid spreading bacteria and viruses.  Only take over-the-counter or prescription medicines for pain, discomfort, or fever as directed by your health care provider.  Ask your health care provider if you should continue all prescribed and over-the-counter medicines.  Keep all follow-up appointments with your health care provider. SEEK MEDICAL CARE IF:  You have abdominal pain, and it increases or stays in one area (localizes).  You have a rash, stiff neck, or severe headache.  You are irritable, sleepy, or difficult to awaken.  You are weak, dizzy, or extremely thirsty. SEEK IMMEDIATE MEDICAL CARE IF:   You are unable to keep fluids down, or you get worse despite treatment.  You have frequent episodes of vomiting or diarrhea.  You have blood or green matter (bile) in your vomit.  You have blood in your stool, or your stool looks black and tarry.  You have not urinated in 6 8 hours, or you have only urinated a small amount of very dark urine.  You have a  fever.  You faint. MAKE SURE YOU:   Understand these instructions.  Will watch your condition.  Will get help right away if you are not doing well or get worse. Document Released: 05/14/2003 Document Revised: 12/12/2012 Document Reviewed: 10/29/2012 [facility] Patient Information 2014 [place].   We discussed improving amount of fluids that you consume.  Always drink 8 ounces of water (flavored is ok) with your meals. Drink at least 4 ounces of water or a liquid with your medications. Limit caffeine and sugary drinks as well as artificial sweeteners.  Take your thyroid medication by itself.

## 2013-04-19 ENCOUNTER — Other Ambulatory Visit: Payer: Self-pay | Admitting: Family Medicine

## 2013-04-19 LAB — TSH: TSH: 1.973 u[IU]/mL (ref 0.350–4.500)

## 2013-04-19 LAB — VITAMIN B12: Vitamin B-12: >2000 pg/mL — ABNORMAL HIGH (ref 211–911)

## 2013-04-19 LAB — T3, FREE: T3 FREE: 2.6 pg/mL (ref 2.3–4.2)

## 2013-04-19 LAB — T4, FREE: FREE T4: 1.62 ng/dL (ref 0.80–1.80)

## 2013-04-21 NOTE — Progress Notes (Signed)
S:  This 73 y.o. Cauc female is here for follow-up re: hypothyroidism and persistent mild subjective weakness. She has been evaluated at [facility] (last visit 04/05/2013) by Dr. Krista Blue for lower extremity peripheral neuropathy; pt states symptoms are very mild. Exam essentially normal; current medication- Mentax- seems to be effective. Pt 's symptoms do not interfere in any significant way w/ her ADLs.   Pt has copies of labs from previous medical practice, most recently drawn Oct 2014:  Total cholesterol= 205  HDL= 55  LDL= 131  TGs= 96   TSH= 1.930   Vit D25,OH= 49.8 (All other labs scanned into EPIC).  HCM:  Pt is due for colonoscopy; she requests Dr. Cristina Gong. Denies any symptoms of colorectal disease.  Patient Active Problem List   Diagnosis Date Noted  . Numbness 08/07/2012  . JOINT EFFUSION, RIGHT KNEE 10/03/2007  . KNEE INJURY, RIGHT 10/03/2007  . HYPERLIPIDEMIA 12/29/2006    Prior to Admission medications   Medication Sig Start Date End Date Taking? Authorizing Provider  aspirin 81 MG tablet Take 81 mg by mouth daily.   Yes Historical Provider, MD  cholecalciferol (VITAMIN D) 1000 UNITS tablet Take 1,000 Units by mouth daily.   Yes Historical Provider, MD  fish oil-omega-3 fatty acids 1000 MG capsule Take 2 g by mouth daily.   Yes Historical Provider, MD  L-Methylfolate-B6-B12 (METANX PO) Take by mouth 2 (two) times daily.   Yes Historical Provider, MD  levothyroxine (SYNTHROID, LEVOTHROID) 112 MCG tablet  07/31/12  Yes Historical Provider, MD  Misc Natural Products (OSTEO BI-FLEX JOINT SHIELD PO) Take by mouth as directed.   Yes Historical Provider, MD  Red Yeast Rice 600 MG TABS Take by mouth as directed.   Yes Historical Provider, MD   PMHx, Surg Hx, Soc and Fam Hx reviewed.  ROS: As per HPI; negative for fatigue, diaphoresis, abnormal weight or appetite change, vision disturbances, CP or tightness, palpitations, SOB or DOE, cough, edema, GI or GU problems,  myalgias/arthralgias, rashes or abnormal bruising, HA, dizziness, lightheadedness, Tremor, gait disturbance, abnormal behavior, dysphoric mood, anxiety, confusion or sleep disturbance.  O: Filed Vitals:   04/18/13 1137  BP: 116/76  Pulse: 56  Temp: 97.8 F (36.6 C)  Resp: 16   GEN: In NAD; WN,WD. HENT: [place]/AT; EOMI w/ clear conj/sclerae. EACs/TMs normal. Nares and septum normal. Oroph and dentition unremarkable. NECK: Supple w/o LAN or TMG. COR: RRR. Normal S1 and S2. No m/g/r. LUNGS: CTA; normal resp rate and effort. SKIN: W&D; intact w/o diaphoresis, erythema, rashes or pallor. NEURO: A&O x 3; CNs intact. Motor and sensory function grossly normal. Nonfocal. PSYCH: Pleasant, calm and alert. Speech pattern and thought content normal. Judgement and cognition unimpaired.  A/P: Unspecified hypothyroidism - Plan: T3, Free, T4, Free, TSH  Weakness - Plan: Basic metabolic panel, CBC with Differential  Neuropathy of both feet - Plan: Basic metabolic panel, Vitamin A41  Screening for colorectal cancer - Plan: Ambulatory referral to Gastroenterology  Discussed maintaining adequate hydration; pt states that she does not consume enough liquids daily and will make a conscious effort to correct this.

## 2013-04-21 NOTE — Progress Notes (Signed)
Quick Note:  Please advise pt regarding following labs...  All lab results are normal except Vitamin B12 is above 2000. Consider taking a little less B12 supplement (take 1 tablet on Saturday and Sunday).  Copy to pt. ______

## 2013-04-22 ENCOUNTER — Encounter: Payer: Self-pay | Admitting: *Deleted

## 2013-04-29 ENCOUNTER — Encounter: Payer: Self-pay | Admitting: Gastroenterology

## 2013-05-08 ENCOUNTER — Ambulatory Visit (AMBULATORY_SURGERY_CENTER): Payer: Self-pay | Admitting: *Deleted

## 2013-05-08 VITALS — Ht 64.5 in | Wt 156.4 lb

## 2013-05-08 DIAGNOSIS — Z86010 Personal history of colonic polyps: Secondary | ICD-10-CM

## 2013-05-08 MED ORDER — MOVIPREP 100 G PO SOLR: ORAL | Status: DC

## 2013-05-08 NOTE — Progress Notes (Signed)
No allergies to eggs or soy. No problems with anesthesia.  

## 2013-05-09 ENCOUNTER — Encounter: Payer: Self-pay | Admitting: Gastroenterology

## 2013-05-15 ENCOUNTER — Encounter: Payer: Self-pay | Admitting: Family Medicine

## 2013-05-22 ENCOUNTER — Encounter: Payer: Medicare Other | Admitting: Gastroenterology

## 2013-07-02 ENCOUNTER — Telehealth: Payer: Self-pay

## 2013-07-02 NOTE — Telephone Encounter (Signed)
Spoke to patient.  Advised her to come to office to sign a release of medical information.  As far as refills, I believe she will need an OV before we can do this.  Sent to Dr. Leward Quan to advise.

## 2013-07-02 NOTE — Telephone Encounter (Signed)
Pt is scheduled to follow-up w/ me in May. I am going to have to decline any more Medicare pts. My understanding is that we are closed to Medicare but I continue to have new Medicare pts in my schedule.

## 2013-07-02 NOTE — Telephone Encounter (Signed)
PT STATES SHE IS SWITCHING OVER TO [facility] FOR HER PCP, THE PHARMACY IS GOING TO BE REQUESTING A REFILL ON HER MEDICINES AND ALSO NEED [place] TO REQUEST HER RECORDS FROM Jani Gravel MD. HIS PHONE NUMBER IS [phone] AND THE FAX IS [phone]. YOU MAY REACH PT AT [phone] IF NEEDED

## 2013-07-03 NOTE — Telephone Encounter (Signed)
I have discussed this with Maudie Mercury, and she has told me as long as the patient has been seen at the office at some point they can make appointment. I think this needs to be clarified with your scheduling staff. Being seen once for a cat bite (as patient was) does not make her an established patient, but to our scheduling staff, it does.

## 2013-07-03 NOTE — Telephone Encounter (Signed)
New patient scheduling is an issue as a whole. We need to continue to clarify policies and procedures.

## 2013-07-24 ENCOUNTER — Ambulatory Visit (INDEPENDENT_AMBULATORY_CARE_PROVIDER_SITE_OTHER): Payer: Medicare Other | Admitting: Family Medicine

## 2013-07-24 ENCOUNTER — Encounter: Payer: Self-pay | Admitting: Family Medicine

## 2013-07-24 ENCOUNTER — Other Ambulatory Visit: Payer: Self-pay | Admitting: Family Medicine

## 2013-07-24 VITALS — BP 122/68 | HR 57 | Temp 97.6°F | Resp 16 | Ht 63.5 in | Wt 150.8 lb

## 2013-07-24 DIAGNOSIS — R3915 Urgency of urination: Secondary | ICD-10-CM

## 2013-07-24 DIAGNOSIS — E039 Hypothyroidism, unspecified: Secondary | ICD-10-CM | POA: Insufficient documentation

## 2013-07-24 DIAGNOSIS — R0600 Dyspnea, unspecified: Secondary | ICD-10-CM

## 2013-07-24 DIAGNOSIS — R001 Bradycardia, unspecified: Secondary | ICD-10-CM | POA: Insufficient documentation

## 2013-07-24 DIAGNOSIS — R0609 Other forms of dyspnea: Secondary | ICD-10-CM

## 2013-07-24 DIAGNOSIS — R0989 Other specified symptoms and signs involving the circulatory and respiratory systems: Secondary | ICD-10-CM

## 2013-07-24 DIAGNOSIS — R0689 Other abnormalities of breathing: Secondary | ICD-10-CM

## 2013-07-24 DIAGNOSIS — Z139 Encounter for screening, unspecified: Secondary | ICD-10-CM

## 2013-07-24 DIAGNOSIS — E782 Mixed hyperlipidemia: Secondary | ICD-10-CM

## 2013-07-24 DIAGNOSIS — I498 Other specified cardiac arrhythmias: Secondary | ICD-10-CM

## 2013-07-24 DIAGNOSIS — Z0000 Encounter for general adult medical examination without abnormal findings: Secondary | ICD-10-CM

## 2013-07-24 DIAGNOSIS — R5381 Other malaise: Secondary | ICD-10-CM

## 2013-07-24 DIAGNOSIS — R5383 Other fatigue: Secondary | ICD-10-CM

## 2013-07-24 MED ORDER — AMOXICILLIN 500 MG PO CAPS: ORAL_CAPSULE | ORAL | Status: DC

## 2013-07-24 MED ORDER — AMOXICILLIN 500 MG PO CAPS: 500.0000 mg | ORAL_CAPSULE | ORAL | Status: DC | PRN

## 2013-07-24 NOTE — Patient Instructions (Signed)
Keeping You Healthy  Get These Tests  Blood Pressure- Have your blood pressure checked by your healthcare provider at least once a year.  Normal blood pressure is 120/80.  Weight- Have your body mass index (BMI) calculated to screen for obesity.  BMI is a measure of body fat based on height and weight.  You can calculate your own BMI at [employer].it  Cholesterol- Have your cholesterol checked every year.  Diabetes- Have your blood sugar checked every year if you have high blood pressure, high cholesterol, a family history of diabetes or if you are overweight.  Pap Smear- Have a pap smear every 1 to 3 years if you have been sexually active.  If you are older than 65 and recent pap smears have been normal you may not need additional pap smears.  In addition, if you have had a hysterectomy  For benign disease additional pap smears are not necessary.  Mammogram-Yearly mammograms are essential for early detection of breast cancer  Screening for Colon Cancer- Colonoscopy starting at age 65. Screening may begin sooner depending on your family history and other health conditions.  Follow up colonoscopy as directed by your Gastroenterologist.  Screening for Osteoporosis- Screening begins at age 73 with bone density scanning, sooner if you are at higher risk for developing Osteoporosis.  Get these medicines  Calcium with Vitamin D- Your body requires 1200-1500 mg of Calcium a day and [phone] IU of Vitamin D a day.  You can only absorb 500 mg of Calcium at a time therefore Calcium must be taken in 2 or 3 separate doses throughout the day.  Hormones- Hormone therapy has been associated with increased risk for certain cancers and heart disease.  Talk to your healthcare provider about if you need relief from menopausal symptoms.  Aspirin- Ask your healthcare provider about taking Aspirin to prevent Heart Disease and Stroke.  Get these Immuniztions  Flu shot- Every fall  Pneumonia  shot- Once after the age of 50; if you are younger ask your healthcare provider if you need a pneumonia shot.  Tetanus- Every ten years.   Zostavax- Once after the age of 6 to prevent shingles.  Take these steps  Don't smoke- Your healthcare provider can help you quit. For tips on how to quit, ask your healthcare provider or go to www.smokefree.gov or call 1-800 QUIT-NOW.  Be physically active- Exercise 5 days a week for a minimum of 30 minutes.  If you are not already physically active, start slow and gradually work up to 30 minutes of moderate physical activity.  Try walking, dancing, bike riding, swimming, etc.  Eat a healthy diet- Eat a variety of healthy foods such as fruits, vegetables, whole grains, low fat milk, low fat cheeses, yogurt, lean meats, chicken, fish, eggs, dried beans, tofu, etc.  For more information go to www.thenutritionsource.org  Dental visit- Brush and floss teeth twice daily; visit your dentist twice a year.  Eye exam- Visit your Optometrist or Ophthalmologist yearly.  Drink alcohol in moderation- Limit alcohol intake to one drink or less a day.  Never drink and drive.  Depression- Your emotional health is as important as your physical health.  If you're feeling down or losing interest in things you normally enjoy, please talk to your healthcare provider.  Seat Belts- can save your life; always wear one  Smoke/Carbon Monoxide detectors- These detectors need to be installed on the appropriate level of your home.  Replace batteries at least once a year.  Violence- If  anyone is threatening or hurting you, please tell your healthcare provider.  Living Will/ Health care power of attorney- Discuss with your healthcare provider and family.    Colorectal Cancer Screening Colorectal cancer screening is done to detect early disease. Colorectal refers to the colon and rectum. The colon and rectum are located at the end of the large intestine (digestive system), and  carry your bowel movements out of the body. Screening may be done even if you are not experiencing symptoms.  Colorectal cancer screening checks for:  Polyps. These are small growths in the lining of the colon that can turn cancerous.  Cancer that is already growing. Cancer is a cluster of abnormal cells that can cause problems in the body. REASONS FOR COLORECTAL CANCER SCREENING  It is common for polyps to form in the lining of the colon, especially in older people. These polyps can be cancerous or become cancerous.  Caught early, colorectal cancer is treatable.  Cancer can be life threatening. Detecting or preventing cancer early can save your life and allow you to enjoy life longer. TYPES OF SCREENING  Fecal occult blood testing. A stool sample is examined for blood in the laboratory.  Sigmoidoscopy. A sigmoidoscope is used to examine the rectum and lower colon. A sigmoidoscope is a flexible tube with a camera that is inserted through your anus to examine your lower rectum.  Colonoscopy. The longer colonoscope is used to examine the entire colon. A colonoscope is also a thin, flexible tube with a camera. This test examines the colon and rectum. Other tests include:  Digital rectal exam.  Barium enema.  Stool DNA test.  Virtual colonoscopy is the use of computerized X-ray scan (computed tomography, CT) to take X-ray images of your colon. WHO SHOULD HAVE COLORECTAL CANCER SCREENING?  Screening is recommended for all adults aged 38 to 21 years.  Screening is generally done every 5 to 10 years or more frequently if you have a family history or symptoms.  Screening is rarely recommended in adults aged 30 to 60 years. Screening is not recommended in adults aged 28 years and older. Your caregiver may recommend screening at a younger age and more frequent screening if you have:  A history of colorectal cancer or polyps.  Family members with histories of colorectal cancer or  polyps.  Inflammatory bowel disease, such as ulcerative colitis or Crohn's disease.  A type of hereditary colon cancer syndrome. Talk with your caregiver about any symptoms, personal and family history. SYMPTOMS OF COLORECTAL CANCER It is important to discuss the following symptoms with your caregiver. These symptoms may be the result of other conditions and may be easily treated:  Rectal bleeding.  Blood in your stool.  Changes in bowel movements (hard or loose stools). These changes may last several weeks.  Abdominal cramping.  Feeling the pressure to have a bowel movement when there is no bowel movement.  Feeling tired or weak.  Unexplained weight loss.  Unexplained low red blood cell count. This may also be called iron deficiency anemia. HOME CARE INSTRUCTIONS   Follow up with your caregiver as directed.  Follow all instructions for preparation before your test as well as after. PREVENTION  Following healthy lifestyle habits each day can reduce your chance of getting colorectal cancer and many other types of cancer:  Eat a healthy, well-balanced diet rich in fruits and vegetables and low in fats, sugars and cholesterol.  Stay active. Try to exercise at least 4 to 6 times per  week for 30 minutes.  Maintain a healthy weight. Ask your caregiver what a healthy weight range is for you.  Women should only drink 1 alcoholic drink per day. Men should only drink 2 alcoholic drinks per day.  Quit smoking. SEEK MEDICAL CARE IF:   You experience abdominal or rectal symptoms (see Symptoms of Colorectal Cancer).  Your gastrointestinal issues (constipation, diarrhea) do not go away as expected.  You have questions or concerns. FOR MORE INFORMATION  American Academy of Family Physicians www.familydoctor.org  Centers for Disease Control and Prevention http://www.wolf.info/  [place] Preventive Services Task Force www.uspreventiveservicestaskforce.[place]  www.cancer.org MAKE SURE YOU:   Understand these instructions.  Will watch your condition.  Will get help right away if you are not doing well or get worse. Always follow up with your caregiver to find out the results of your tests. Not all test results may be available during your visit. If your test results are not back during the visit, make an appointment with your caregiver to find out the results. Do not assume everything is normal if you have not heard from your caregiver or the medical facility. It is important for you to follow up on all of your test results.  Document Released: 08/11/2009 Document Revised: 05/16/2011 Document Reviewed: 08/11/2009 [facility] Patient Information 2014 [place].    Bradycardia Bradycardia is a term for a heart rate (pulse) that, in adults, is slower than 60 beats per minute. A normal rate is 60 to 100 beats per minute. A heart rate below 60 beats per minute may be normal for some adults with healthy hearts. If the rate is too slow, the heart may have trouble pumping the volume of blood the body needs. If the heart rate gets too low, blood flow to the brain may be decreased and may make you feel lightheaded, dizzy, or faint. The heart has a natural pacemaker in the top of the heart called the SA node (sinoatrial or sinus node). This pacemaker sends out regular electrical signals to the muscle of the heart, telling the heart muscle when to beat (contract). The electrical signal travels from the upper parts of the heart (atria) through the AV node (atrioventricular node), to the lower chambers of the heart (ventricles). The ventricles squeeze, pumping the blood from your heart to your lungs and to the rest of your body. CAUSES   Problem with the heart's electrical system.  Problem with the heart's natural pacemaker.  Heart disease, damage, or infection.  Medications.  Problems with minerals and salts (electrolytes). SYMPTOMS   Fainting  (syncope).  Fatigue and weakness.  Shortness of breath (dyspnea).  Chest pain (angina).  Drowsiness.  Confusion. DIAGNOSIS   An electrocardiogram (ECG) can help your caregiver determine the type of slow heart rate you have.  If the cause is not seen on an ECG, you may need to wear a heart monitor that records your heart rhythm for several hours or days.  Blood tests. TREATMENT   Electrolyte supplements.  Medications.  Withholding medication which is causing a slow heart rate.  Pacemaker placement. SEEK IMMEDIATE MEDICAL CARE IF:   You feel lightheaded or faint.  You develop an irregular heart rate.  You feel chest pain or have trouble breathing. MAKE SURE YOU:   Understand these instructions.  Will watch your condition.  Will get help right away if you are not doing well or get worse. Document Released: 11/13/2001 Document Revised: 05/16/2011 Document Reviewed: 10/10/2007 [employer] Patient Information  2014 [place], [place].

## 2013-07-24 NOTE — Progress Notes (Signed)
Subjective:    Patient ID: Jessica [name], female    DOB: November 26, 1940, 73 y.o.   MRN: [id number]  HPI This 73 y.o. Cauc female is here for [facility] Subsequent Annual CPE.  She has hypothyroidism which is adequately supplemented on current medication. She has recently been through a very stressful personal situation involving the death of an "ex"; the issues have slowly evolved since Feb 2015 and have been resolved (legal involvement due to criminal behavior of in-home caregiver). During this time, pt experienced fatigue, dyspnea and mild chest discomfort w/ poor exercise stamina.   HCM: MMG- Current per pt.(negative in 2014).           CRS- GI specialist has scheduled procedure but pt objects to prep; 1 adenoma in 2006.            IMM- Awaiting record of vaccines from previous medical practice.            DEXA- Within last 10 years (T- score  -2.13)  Patient Active Problem List   Diagnosis Date Noted  . HYPOTHYROIDISM 07/24/2013  . Numbness 08/07/2012  . KNEE INJURY, RIGHT 10/03/2007  . HYPERLIPIDEMIA 12/29/2006   Prior to Admission medications   Medication Sig Start Date End Date Taking? Authorizing Provider  amoxicillin (AMOXIL) 500 MG capsule Take 1 capsule (500 mg total) by mouth as needed.   Yes Barton Fanny, MD  aspirin 81 MG tablet Take 81 mg by mouth daily.   Yes Historical Provider, MD  cholecalciferol (VITAMIN D) 1000 UNITS tablet Take 1,000 Units by mouth daily.   Yes Historical Provider, MD  fish oil-omega-3 fatty acids 1000 MG capsule Take 2 g by mouth daily.   Yes Historical Provider, MD  L-Methylfolate-B6-B12 (METANX PO) Take by mouth 2 (two) times daily.   Yes Historical Provider, MD  levothyroxine (SYNTHROID, LEVOTHROID) 112 MCG tablet  07/31/12  Yes Historical Provider, MD  Misc Natural Products (OSTEO BI-FLEX JOINT SHIELD PO) Take by mouth as directed.   Yes Historical Provider, MD  Red Yeast Rice 600 MG TABS Take by mouth as directed.   Yes Historical Provider, MD    MOVIPREP 100 G SOLR moviprep as directed. No substitutions 05/08/13   Milus Banister, MD   PMHx, Surg Hx, Soc and Fam Hx reviewed.   Review of Systems  Constitutional: Positive for fatigue.  HENT: Positive for hearing loss and tinnitus.        Chronic- wears hearing aids  Eyes: Negative.        Wears corrective lenses- last exam 2 years ago.  Respiratory: Positive for shortness of breath. Negative for cough and chest tightness.   Cardiovascular: Positive for chest pain and palpitations.  Gastrointestinal: Negative.   Endocrine: Negative.   Genitourinary: Positive for urgency and frequency. Negative for dysuria, hematuria, difficulty urinating and pelvic pain.  Musculoskeletal: Positive for myalgias. Negative for arthralgias, back pain and gait problem.       R foot tingling in toes; relieved w/ B12 supplement.  Neurological: Positive for weakness. Negative for dizziness, syncope, numbness and headaches.  Psychiatric/Behavioral: Positive for sleep disturbance and agitation.       Related to stressful personal situation which has resolved.      Objective:   Physical Exam  Nursing note and vitals reviewed. Constitutional: She is oriented to person, place, and time. Vital signs are normal. She appears well-developed and well-nourished. No distress.  HENT:  Head: Normocephalic and atraumatic.  Right Ear: External ear and ear  canal normal. Tympanic membrane is scarred. Decreased hearing is noted.  Left Ear: External ear and ear canal normal. Tympanic membrane is scarred. Decreased hearing is noted.  Nose: Nose normal. No nasal deformity or septal deviation.  Mouth/Throat: Uvula is midline, oropharynx is clear and moist and mucous membranes are normal. No oral lesions. Normal dentition.  Eyes: Conjunctivae, EOM and lids are normal. Pupils are equal, round, and reactive to light. No scleral icterus.  Wears corrective lenses.  Neck: Normal range of motion and full passive range of motion  without pain. Neck supple. No JVD present. No spinous process tenderness and no muscular tenderness present. Carotid bruit is not present. No mass and no thyromegaly present.  Cardiovascular: Normal rate, regular rhythm, S1 normal, S2 normal, normal heart sounds, intact distal pulses and normal pulses.   No extrasystoles are present. PMI is not displaced.  Exam reveals no gallop and no friction rub.   No murmur heard. Pulmonary/Chest: Breath sounds normal. No respiratory distress. Right breast exhibits no inverted nipple, no mass, no nipple discharge, no skin change and no tenderness. Left breast exhibits no inverted nipple, no mass, no nipple discharge, no skin change and no tenderness. Breasts are symmetrical.  Abdominal: Soft. Normal appearance and bowel sounds are normal. She exhibits no distension, no abdominal bruit, no pulsatile midline mass and no mass. There is no hepatosplenomegaly. There is no tenderness. There is no guarding and no CVA tenderness. No hernia.  Genitourinary:  Deferred.  Musculoskeletal:       Right knee: She exhibits decreased range of motion and bony tenderness. She exhibits no effusion, no deformity and normal patellar mobility. Tenderness found.       Left knee: Normal.       Cervical back: Normal.       Thoracic back: Normal.       Lumbar back: Normal.  Mild tenderness w/ palpation of SI joints; mild degenerative changes in all major joints.  Lymphadenopathy:       Head (right side): No submental, no submandibular, no tonsillar, no posterior auricular and no occipital adenopathy present.       Head (left side): No submental, no submandibular, no tonsillar, no posterior auricular and no occipital adenopathy present.    She has no cervical adenopathy.    She has no axillary adenopathy.       Right: No inguinal and no supraclavicular adenopathy present.       Left: No inguinal and no supraclavicular adenopathy present.  Neurological: She is alert and oriented to  person, place, and time. She has normal strength. She displays no atrophy. No cranial nerve deficit or sensory deficit. She exhibits normal muscle tone. Coordination and gait normal.  Reflex Scores:      Tricep reflexes are 2+ on the right side and 2+ on the left side.      Bicep reflexes are 2+ on the right side and 2+ on the left side.      Brachioradialis reflexes are 2+ on the right side and 2+ on the left side.      Patellar reflexes are 1+ on the right side and 2+ on the left side. Skin: Skin is warm, dry and intact. No ecchymosis, no lesion and no rash noted. She is not diaphoretic. No cyanosis or erythema. No pallor. Nails show no clubbing.  Psychiatric: She has a normal mood and affect. Her speech is normal and behavior is normal. Judgment and thought content normal. Cognition and memory are normal.  Pt unable to collect urine specimen.  ECG: Sinus bradycardia; no ST-TW changes. No ectopy.     Assessment & Plan:  Routine general medical examination at a health care facility  Other malaise and fatigue - Sleep hygiene is improving w/ decreased personal stress; focus on good nutrition and adequate hydration. Plan: EEG, Ambulatory referral to Cardiology  Dyspnea and respiratory abnormality - Plan: EEG, Ambulatory referral to Cardiology  Urinary urgency- Pt unable to collect a urine specimen today.  Bradycardia on ECG - Plan: Ambulatory referral to Cardiology; pt advised to avoid strenuous activity/ no heavy lifting until evaluated by cardiology.  HYPERLIPIDEMIA- Continue current treatment.

## 2013-07-26 ENCOUNTER — Encounter: Payer: Self-pay | Admitting: Family Medicine

## 2013-08-11 LAB — IFOBT (OCCULT BLOOD)
IFOBT: NEGATIVE
IFOBT: NEGATIVE
IFOBT: POSITIVE

## 2013-08-11 NOTE — Progress Notes (Signed)
Quick Note:  Please advise pt regarding following labs... One of the 3 stool collections that you returned to the office was positive for evidence of blood. This may be due to hemorrhoids. Since I have no indication of which order you collected the specimens, I am advising you to follow-up with the GI specialist. You said that you had been scheduled for a colonoscopy but you objected to the procedure. It would be wise to contact the GI specialist and discuss procedure options with him/her.  Contact the clinic if you have questions or concerns. Take care. ______

## 2013-08-11 NOTE — Addendum Note (Signed)
Addended by: Guinevere Scarlet R on: 08/11/2013 12:20 PM   Modules accepted: Orders

## 2013-08-13 ENCOUNTER — Encounter: Payer: Self-pay | Admitting: Radiology

## 2013-09-06 ENCOUNTER — Encounter: Payer: Self-pay | Admitting: Family Medicine

## 2013-09-20 ENCOUNTER — Ambulatory Visit (INDEPENDENT_AMBULATORY_CARE_PROVIDER_SITE_OTHER): Payer: Medicare Other | Admitting: Interventional Cardiology

## 2013-09-20 ENCOUNTER — Encounter: Payer: Self-pay | Admitting: Interventional Cardiology

## 2013-09-20 VITALS — BP 124/73 | HR 56 | Ht 64.0 in | Wt 151.0 lb

## 2013-09-20 DIAGNOSIS — R0789 Other chest pain: Secondary | ICD-10-CM

## 2013-09-20 DIAGNOSIS — R079 Chest pain, unspecified: Secondary | ICD-10-CM

## 2013-09-20 DIAGNOSIS — I498 Other specified cardiac arrhythmias: Secondary | ICD-10-CM

## 2013-09-20 DIAGNOSIS — R001 Bradycardia, unspecified: Secondary | ICD-10-CM

## 2013-09-20 NOTE — Progress Notes (Signed)
Patient ID: Jessica [name], female   DOB: 12/30/1940, 73 y.o.   MRN: [id number]   Date: 09/20/2013 ID: Jessica [name], DOB 1940/05/12, MRN [id number] PCP: Ellsworth Lennox, MD  Reason: Fatigue and exertional chest discomfort  ASSESSMENT;  1. Exertional chest discomfort 2. Bradycardia 3. Mental stress  PLAN:  1. Exercise treadmill test 2. May need Holter monitor/continuous ambulatory monitor if arrhythmias are noted on stress test   SUBJECTIVE: Jessica [name] is a 73 y.o. female who is here for evaluation of exertional chest discomfort. A sensation of pressure has been noted with certain activities such as walking up an incline since February. There is some associated dyspnea. She has been under a lot of mental stress. She has no discomfort at rest. No significant history of cardiac risk factors. She denies orthopnea, PND, edema, and syncope. She does notice heart pounding in the chest with walking up inclines.   Allergies  Allergen Reactions  . Ansaid [Flurbiprofen]     Stomach upset  . Atorvastatin     Muscle weakness  . Ibuprofen     Stomach upset  . Lorazepam     Per pt: unknown  . Nsaids     Stomach upset  . Rosuvastatin     Muscle weaknes    Current Outpatient Prescriptions on File Prior to Visit  Medication Sig Dispense Refill  . amoxicillin (AMOXIL) 500 MG capsule Take 4 capsules 1 hour prior to dental procedure.  4 capsule  3  . aspirin 81 MG tablet Take 81 mg by mouth daily.      . cholecalciferol (VITAMIN D) 1000 UNITS tablet Take 1,000 Units by mouth daily.      . fish oil-omega-3 fatty acids 1000 MG capsule Take 2 g by mouth daily.      Marland Kitchen L-Methylfolate-B6-B12 (METANX PO) Take by mouth 2 (two) times daily.      Marland Kitchen levothyroxine (SYNTHROID, LEVOTHROID) 112 MCG tablet Monday, Wednesday, Friday 125 mcg Tuesday, Thursday, Saturday, Sunday 11mcg      . Misc Natural Products (OSTEO BI-FLEX JOINT SHIELD PO) Take by mouth as directed.      . Red Yeast Rice  600 MG TABS Take by mouth as directed.       No current facility-administered medications on file prior to visit.    Past Medical History  Diagnosis Date  . High cholesterol   . Thyroid disorder   . Pericarditis 01/1999  . Pneumothorax 01/1999  . Osteopenia   . Vitamin B 12 deficiency     Past Surgical History  Procedure Laterality Date  . Tonsillectomy and adenoidectomy  1952?  Marland Kitchen Birthmark       removed,left leg    History   Social History  . Marital Status: Divorced    Spouse Name: N/A    Number of Children: 1  . Years of Education: college   Occupational History  . retired    Social History Main Topics  . Smoking status: Never Smoker   . Smokeless tobacco: Never Used  . Alcohol Use: 1.8 oz/week    3 Glasses of wine per week  . Drug Use: No  . Sexual Activity: Not on file   Other Topics Concern  . Not on file   Social History Narrative   She lives alone, retired, has one child.  No smoke, drink occasionally   Education- college three masters.   Right handed.   Caffeine- Coffee and tea daily.    Family History  Problem Relation Age  of Onset  . Diabetes Mother   . Asthma Mother   . Ataxia Father   . Cancer - Cervical Sister   . Thyroid disease Sister   . Diabetes Other   . Diabetes Maternal Grandmother   . Arthritis Paternal Grandmother   . Colon cancer Neg Hx     ROS: No history of heart disease. Denies prolonged chest pain. She has not had transient neurological complaints. There is no peripheral edema. No episodes of syncope. No orthopnea, PND, or peripheral edema has been noted. Since February when her symptoms started her sleep pattern has been disrupted by emotional stress. She feels somewhat better from that standpoint currently.. Other systems negative for complaints.  OBJECTIVE: BP 124/73  Pulse 56  Ht 5\' 4"  (1.626 m)  Wt 151 lb (68.493 kg)  BMI 25.91 kg/m2,  General: No acute distress, elderly HEENT: normal without jaundice or  pallor Neck: JVD flat. Carotids 2+ and symmetric Chest: Clear Cardiac: Murmur: None. Gallop: S4. Rhythm: Normal. Other: Normal Abdomen: Bruit: Absent. Pulsation: Absent Extremities: Edema: None. Pulses: 2+ Neuro: Normal Psych: Normal  ECG: Sinus bradycardia in May. No other abnormality was noted.

## 2013-09-20 NOTE — Patient Instructions (Signed)
Your physician recommends that you continue on your current medications as directed. Please refer to the Current Medication list given to you today.  Your physician has requested that you have an exercise tolerance test. For further information please visit [email or web address]. Please also follow instruction sheet, as given.   Your physician recommends that you schedule a follow-up appointment in: BASED UPON RESULTS ON TREADMILL

## 2013-10-16 ENCOUNTER — Telehealth (HOSPITAL_COMMUNITY): Payer: Self-pay

## 2013-10-16 NOTE — Telephone Encounter (Signed)
Encounter complete. 

## 2013-10-18 ENCOUNTER — Ambulatory Visit (HOSPITAL_COMMUNITY)
Admission: RE | Admit: 2013-10-18 | Discharge: 2013-10-18 | Disposition: A | Discharge status: Home / Self Care | Payer: Medicare Other | Source: Ambulatory Visit | Attending: Interventional Cardiology | Admitting: Interventional Cardiology

## 2013-10-18 DIAGNOSIS — R079 Chest pain, unspecified: Secondary | ICD-10-CM

## 2013-10-18 DIAGNOSIS — R9439 Abnormal result of other cardiovascular function study: Secondary | ICD-10-CM | POA: Diagnosis not present

## 2013-10-18 NOTE — Procedures (Signed)
Exercise Treadmill Test  Test  Exercise Tolerance Test Ordering MD: Belva Crome, III, MD    Unique Test No: 1   Treadmill:  1  Indication for ETT: chest pain - rule out ischemia  Contraindication to ETT: No   Stress Modality: exercise - treadmill  Cardiac Imaging Performed: non   Protocol: standard Bruce - maximal  Max BP:  161/115  Max MPHR (bpm):  148 85% MPR (bpm):  125  MPHR obtained (bpm):  136 % MPHR obtained:  91  Reached 85% MPHR (min:sec):  3:45 Total Exercise Time (min-sec):  5  Workload in METS:  7.0 Borg Scale: 15  Reason ETT Terminated: Diastolic HTN, SOB and Leg Fatigue    ST Segment Analysis At Rest: NSR with no ST changes. With Exercise: significant ischemic ST depression  Other Information Arrhythmia:  No Angina during ETT:  absent (0) Quality of ETT:  diagnostic  ETT Interpretation:  abnormal - evidence of ST depression consistent with ischemia  Comments: ETT with dyspnea but no chest pain; normal BP response; 1 mm ST depression in the inferolateral leads at 6:00 minutes consistent with ischemia. Abnormal ETT.  Jessica [name]

## 2013-10-29 ENCOUNTER — Telehealth: Payer: Self-pay

## 2013-10-29 DIAGNOSIS — R9430 Abnormal result of cardiovascular function study, unspecified: Secondary | ICD-10-CM

## 2013-10-29 NOTE — Telephone Encounter (Signed)
pt aware of gxt results and Dr.Smith's recommnedations.Abnormal ECG response. Needs cath(Left heart and cor angio vs.stress nuclear) I would suggest Nuc. pt wants to proceed with less evasive procedure. adv pt that a scheduler form our office will call her to schedule nuclear stress. Pt given verbal pre testing instructions, written instructions will be mailed to pt. Pt verbalized understanding.

## 2013-10-29 NOTE — Telephone Encounter (Signed)
Message copied by Lamar Laundry on Tue Oct 29, 2013  9:54 AM ------      Message from: Daneen Schick      Created: Fri Oct 25, 2013  9:52 PM       Abnormal ECG response. Needs cath(Left heart and cor angio vs.stress nuclear) I would suggest Nuc ------

## 2013-11-06 ENCOUNTER — Encounter: Payer: Self-pay | Admitting: Internal Medicine

## 2013-11-07 ENCOUNTER — Ambulatory Visit (HOSPITAL_COMMUNITY): Payer: Medicare Other | Attending: Cardiology | Admitting: Radiology

## 2013-11-07 VITALS — BP 128/59 | HR 55 | Ht 64.0 in | Wt 152.0 lb

## 2013-11-07 DIAGNOSIS — E785 Hyperlipidemia, unspecified: Secondary | ICD-10-CM | POA: Diagnosis not present

## 2013-11-07 DIAGNOSIS — R0609 Other forms of dyspnea: Secondary | ICD-10-CM | POA: Insufficient documentation

## 2013-11-07 DIAGNOSIS — R0989 Other specified symptoms and signs involving the circulatory and respiratory systems: Secondary | ICD-10-CM | POA: Diagnosis not present

## 2013-11-07 DIAGNOSIS — R079 Chest pain, unspecified: Secondary | ICD-10-CM | POA: Diagnosis not present

## 2013-11-07 DIAGNOSIS — R9430 Abnormal result of cardiovascular function study, unspecified: Secondary | ICD-10-CM | POA: Insufficient documentation

## 2013-11-07 MED ORDER — TECHNETIUM TC 99M SESTAMIBI GENERIC - CARDIOLITE
10.0000 | Freq: Once | INTRAVENOUS | Status: AC | PRN
  Administered 2013-11-07: 10 via INTRAVENOUS

## 2013-11-07 MED ORDER — TECHNETIUM TC 99M SESTAMIBI GENERIC - CARDIOLITE
30.0000 | Freq: Once | INTRAVENOUS | Status: AC | PRN
  Administered 2013-11-07: 30 via INTRAVENOUS

## 2013-11-07 NOTE — Progress Notes (Signed)
[facility] [address] [place], [place] [address] [phone]    Cardiology Nuclear Med Study  Jessica [name] is a 73 y.o. female     MRN : [id number]     DOB: 10-06-1940  Procedure Date: 11/07/2013  Nuclear Med Background Indication for Stress Test:  Evaluation for Ischemia, and Abnormal GXT on 10-18-2013([facility]) Significant ST Depression inferolateral, MHR reached 136, 91% History:  MPI 2003 (normal) EF 69% Cardiac Risk Factors: Lipids  Symptoms:  Chest Pain with Exertion and DOE   Nuclear Pre-Procedure Caffeine/Decaff Intake:  None> 12 hrs NPO After: 8:00pm   Lungs:  clear O2 Sat: 93% on room air. IV 0.9% NS with Angio Cath:  22g  IV Site: R Wrist x 1, tolerated well IV Started by:  Irven Baltimore, RN  Chest Size (in):  40 Cup Size: C  Height: 5\' 4"  (1.626 m)  Weight:  152 lb (68.947 kg)  BMI:  Body mass index is 26.08 kg/(m^2). Tech Comments:  N/A    Nuclear Med Study 1 or 2 day study: 1 day  Stress Test Type:  Stress  Reading MD: N/A  Order Authorizing Provider:  Daneen Schick, MD  Resting Radionuclide: Technetium 57m Sestamibi  Resting Radionuclide Dose: 11.0 mCi   Stress Radionuclide:  Technetium 20m Sestamibi  Stress Radionuclide Dose: 33.0 mCi           Stress Protocol Rest HR: 55 Stress HR: 142  Rest BP: 128/59 Stress BP: 114/94  Exercise Time (min): 7:15 METS: 8.9           Dose of Adenosine (mg):  n/a Dose of Lexiscan: n/a mg  Dose of Atropine (mg): n/a Dose of Dobutamine: n/a mcg/kg/min (at max HR)  Stress Test Technologist: Glade Lloyd, BS-ES  Nuclear Technologist:  Annye Rusk, CNMT     Rest Procedure:  Myocardial perfusion imaging was performed at rest 45 minutes following the intravenous administration of Technetium 14m Sestamibi. Rest ECG: NSR with non-specific ST-T wave changes  Stress Procedure:  The patient exercised on the treadmill utilizing the Bruce Protocol for 7:15 minutes. The patient stopped due to  fatigue and denied any chest pain.  Technetium 4m Sestamibi was injected at peak exercise and myocardial perfusion imaging was performed after a brief delay. Stress ECG: There is 2 mm ST segment depression in lead V5, horizontal.  QPS Raw Data Images:  Normal; no motion artifact; normal heart/lung ratio. Stress Images:  Normal homogeneous uptake in all areas of the myocardium. Rest Images:  Homogeneous radiotracer uptake Subtraction (SDS):  Normal Transient Ischemic Dilatation (Normal <1.22):  1.07 Lung/Heart Ratio (Normal <0.45):  0.23  Quantitative Gated Spect Images QGS EDV:  73 ml QGS ESV:  27 ml  Impression Exercise Capacity:  Good exercise capacity. BP Response:  Blood pressure decreases early into exercise from 138/86-114/94 however immediately post exercise is 144/92.  Clinical Symptoms:  No significant symptoms noted. ECG Impression:  2 mm ST segment depression noted in V5 Comparison with Prior Nuclear Study: No images to compare  Overall Impression:  Low risk stress nuclear study with no areas of ischemia identified. Abnormal exercise treadmill portion of test noted. False positive exercise treadmill test. If symptoms worsen or become more worrisome, further diagnostic testing with cardiac catheterization may be necessary..  LV Ejection Fraction: 63%.  LV Wall Motion:  NL LV Function; NL Wall Motion  Candee Furbish, MD

## 2013-11-08 ENCOUNTER — Telehealth: Payer: Self-pay

## 2013-11-08 NOTE — Telephone Encounter (Signed)
called to give pt myoview results.lmtcb

## 2013-11-08 NOTE — Telephone Encounter (Signed)
Message copied by Lamar Laundry on Fri Nov 08, 2013  2:55 PM ------      Message from: Daneen Schick      Created: Fri Nov 08, 2013  2:26 PM       Stress test is low risk and the ECG abnormality is falsely positive. Therefore, we do not feel there is any significant blockage. ------

## 2013-11-12 NOTE — Telephone Encounter (Signed)
F/u ° ° °Pt returning your call. Please call °

## 2013-11-12 NOTE — Telephone Encounter (Signed)
Message copied by Lamar Laundry on Tue Nov 12, 2013 11:34 AM ------      Message from: Daneen Schick      Created: Fri Nov 08, 2013  2:26 PM       Stress test is low risk and the ECG abnormality is falsely positive. Therefore, we do not feel there is any significant blockage. ------

## 2013-11-12 NOTE — Telephone Encounter (Signed)
pt aware of myoview results.Stress test is low risk and the ECG abnormality is falsely positive. Therefore, we do not feel there is any significant blockage.pt verbalized understanding.

## 2013-12-04 ENCOUNTER — Ambulatory Visit (INDEPENDENT_AMBULATORY_CARE_PROVIDER_SITE_OTHER): Payer: Medicare Other | Admitting: Radiology

## 2013-12-04 ENCOUNTER — Telehealth: Payer: Self-pay

## 2013-12-04 DIAGNOSIS — Z23 Encounter for immunization: Secondary | ICD-10-CM

## 2013-12-04 NOTE — Telephone Encounter (Signed)
Patient called yesterday regarding records that should have been sent to our office from [facility]. Release form is scanned into Epic. There is an immunization form that was scanned, but no other records. She wanted me to call their office to check status of request because this was done a few months ago and she paid $30 for records to be sent. I left a voicemail for that office to call back. Linda from medical records did leave me a voicemail stating she got the release it specified to send records 2000-present and she sent a total of 38 pages. Her call back number is [phone]. I will look again for the rest of the records. Will call patient with an update.

## 2013-12-10 NOTE — Telephone Encounter (Signed)
Spoke with patient this morning. Records are scanned into chart. Due to records being separated into batch and order level, I had to search until I found them. Explained to patient that records are separated for scanning based on document type but they are in her chart. Patient understood.

## 2014-01-14 ENCOUNTER — Other Ambulatory Visit: Payer: Self-pay | Admitting: Dermatology

## 2014-01-21 ENCOUNTER — Other Ambulatory Visit: Payer: Self-pay | Admitting: Dermatology

## 2014-01-23 ENCOUNTER — Other Ambulatory Visit: Payer: Self-pay

## 2014-01-23 MED ORDER — LEVOTHYROXINE SODIUM 112 MCG PO TABS: ORAL_TABLET | ORAL | Status: DC

## 2014-01-23 NOTE — Telephone Encounter (Signed)
I have refilled Levothyroxine w/ a note that she needs OV for additional refills.

## 2014-01-23 NOTE — Telephone Encounter (Signed)
[facility] mail order reqs RF of levothyroxine. Dr Leward Quan, you checked pts' TSH in Feb, but I don't see that we have ever Rxd this for pt. She is over due for f/up, do you want to deny, or give her 1 mos w/notice she needs OV/Labs?

## 2014-01-24 ENCOUNTER — Other Ambulatory Visit: Payer: Self-pay

## 2014-01-24 DIAGNOSIS — E782 Mixed hyperlipidemia: Secondary | ICD-10-CM

## 2014-01-24 DIAGNOSIS — E039 Hypothyroidism, unspecified: Secondary | ICD-10-CM

## 2014-01-24 MED ORDER — LEVOTHYROXINE SODIUM 112 MCG PO TABS: ORAL_TABLET | ORAL | Status: DC

## 2014-01-24 MED ORDER — LEVOTHYROXINE SODIUM 125 MCG PO TABS: ORAL_TABLET | ORAL | Status: DC

## 2014-01-24 NOTE — Telephone Encounter (Signed)
Received fax from [facility] requesting clarification on levothyroxine. They had sent RF req on 112 mcg and I had RFd for 1 mos w/note for pt to RTC, but pt also takes 125 mcg. I called pt who clarified that she needs Rxs for both strengths sent to [facility] and would like RFs for 90 days to get discount. Pt questions why she needs to RTC for check up bc she is not having problems with anything. She stated the melanoma is being followed by dermatologist, and her TSH has been stable for quite some time at current dose and TSH was normal at check up here. She asked about getting her chol checked bc that hasn't been done, but would like to just come in for Lab Only, she doesn't feel that she needs to see the doctor. Dr Leward Quan, please advise when you need to see pt again and if you want to order lipid lab? Pended 90 day RFs of both strengths of levo for another 6 mos in case you only need to see her 1 x yr.

## 2014-01-24 NOTE — Telephone Encounter (Signed)
I have authorized 90 -day supply on both strengths of levothyroxine with 1 additional refill = 6 months. I will order labs to check lipids and thyroid function (lab only visit- fasting) sometime before the end of this year.   I will see her in May 2016 (on or after 07/25/14) for her [facility] wellness visit.

## 2014-01-28 ENCOUNTER — Ambulatory Visit (INDEPENDENT_AMBULATORY_CARE_PROVIDER_SITE_OTHER): Payer: Medicare Other | Admitting: Family Medicine

## 2014-01-28 VITALS — BP 128/70 | HR 60 | Temp 97.5°F | Resp 16 | Ht 65.0 in | Wt 158.0 lb

## 2014-01-28 DIAGNOSIS — W5501XA Bitten by cat, initial encounter: Secondary | ICD-10-CM

## 2014-01-28 DIAGNOSIS — S6992XA Unspecified injury of left wrist, hand and finger(s), initial encounter: Secondary | ICD-10-CM

## 2014-01-28 MED ORDER — AMOXICILLIN-POT CLAVULANATE 875-125 MG PO TABS: 1.0000 | ORAL_TABLET | Freq: Two times a day (BID) | ORAL | Status: DC

## 2014-01-28 NOTE — Patient Instructions (Signed)
Take the augmentin twice a day, and try some warm compresses for your hand.  If you are getting worse call me or come back!   Take it easy with your hand but it is good to use it within reason.

## 2014-01-28 NOTE — Progress Notes (Signed)
[facility] [address], [place] [address] [phone]  Date:  01/28/2014   Name:  Jessica [name]   DOB:  [birth year]   MRN:  [id number]  PCP:  Ellsworth Lennox, MD    Chief Complaint: Animal Bite   History of Present Illness:  Jessica [name] is a 73 y.o. very pleasant female patient who presents with the following:  Yesterday am her cat bit her left hand. It was painful and she thought she was bitten pretty deeply.  The bite seemed to start getting infected right away.  She had some augmentin 875 at home so she took 2 of then yesterday and one this am.   She was bitten by her own cat- she has always tended to bite so this was not unusual.  Bo moved her arm in her sleep and touched the cat's head so she bit.  The cat is UTD on all shots as far as she knows   Tetanus shot in 2010  Patient Active Problem List   Diagnosis Date Noted  . Hypothyroidism 07/24/2013  . Bradycardia on ECG 07/24/2013  . Numbness 08/07/2012  . KNEE INJURY, RIGHT 10/03/2007  . HYPERLIPIDEMIA 12/29/2006    Past Medical History  Diagnosis Date  . High cholesterol   . Thyroid disorder   . Pericarditis 01/1999  . Pneumothorax 01/1999  . Osteopenia   . Vitamin B 12 deficiency     Past Surgical History  Procedure Laterality Date  . Tonsillectomy and adenoidectomy  1952?  Marland Kitchen Birthmark       removed,left leg    History  Substance Use Topics  . Smoking status: Never Smoker   . Smokeless tobacco: Never Used  . Alcohol Use: 1.8 oz/week    3 Glasses of wine per week    Family History  Problem Relation Age of Onset  . Diabetes Mother   . Asthma Mother   . Ataxia Father   . Cancer - Cervical Sister   . Thyroid disease Sister   . Diabetes Other   . Diabetes Maternal Grandmother   . Arthritis Paternal Grandmother   . Colon cancer Neg Hx     Allergies  Allergen Reactions  . Ansaid [Flurbiprofen]     Stomach upset  . Atorvastatin     Muscle weakness  .  Ibuprofen     Stomach upset  . Lorazepam     Per pt: unknown  . Nsaids     Stomach upset  . Rosuvastatin     Muscle weaknes    Medication list has been reviewed and updated.  Current Outpatient Prescriptions on File Prior to Visit  Medication Sig Dispense Refill  . aspirin 81 MG tablet Take 81 mg by mouth daily.    . cholecalciferol (VITAMIN D) 1000 UNITS tablet Take 1,000 Units by mouth daily.    . fish oil-omega-3 fatty acids 1000 MG capsule Take 2 g by mouth daily.    Marland Kitchen L-Methylfolate-B6-B12 (METANX PO) Take by mouth 2 (two) times daily.    Marland Kitchen levothyroxine (SYNTHROID, LEVOTHROID) 112 MCG tablet Monday, Wednesday, Friday- dose = 125 mcg.  Tuesday, Thursday, Saturday, Sunday- dose = 112 mcg. 90 tablet 1  . levothyroxine (SYNTHROID, LEVOTHROID) 125 MCG tablet Monday, Wed, Fri dose = 125 mcg. Tues, Thurs, Saturday, Sunday dose = 112 mcg 90 tablet 1  . Misc Natural Products (OSTEO BI-FLEX JOINT SHIELD PO) Take by mouth as directed.    . Red Yeast Rice 600 MG TABS  Take by mouth as directed.     No current facility-administered medications on file prior to visit.    Review of Systems:  As per HPI- otherwise negative.   Physical Examination: Filed Vitals:   01/28/14 0851  BP: 128/70  Pulse: 60  Temp: 97.5 F (36.4 C)  Resp: 16   Filed Vitals:   01/28/14 0851  Height: 5\' 5"  (1.651 m)  Weight: 158 lb (71.668 kg)   Body mass index is 26.29 kg/(m^2). Ideal Body Weight: Weight in (lb) to have BMI = 25: 149.9  GEN: WDWN, NAD, Non-toxic, A & O x 3, looks well HEENT: Atraumatic, Normocephalic. Neck supple. No masses, No LAD. Ears and Nose: No external deformity. CV: RRR, No M/G/R. No JVD. No thrill. No extra heart sounds. PULM: CTA B, no wheezes, crackles, rhonchi. No retractions. No resp. distress. No accessory muscle use. EXTR: No c/c/e NEURO Normal gait.  PSYCH: Normally interactive. Conversant. Not depressed or anxious appearing.  Calm demeanor.  Left hand: there are  two tender puncture wounds on the dorsum of the hand near the base of the thumb and index finger.  They produce a small amount of pus when squeezed.   Normal strength, sensation and ROM of the hand and fingers   Cleaned wounds with peroxide and dressed.    Assessment and Plan: Cat bite, initial encounter - Plan: amoxicillin-clavulanate (AUGMENTIN) 875-125 MG per tablet  Injury of left hand, initial encounter - Plan: amoxicillin-clavulanate (AUGMENTIN) 875-125 MG per tablet  Cat bite- continue augmentin for 10 days. She does feel that she as noted some improvement since yesterday which is reassuring.  However asked her to come back in if not continuing to improve- Sooner if worse.  Bite report faxed and she will expect a call from animal control   Signed Lamar Blinks, MD

## 2014-01-29 ENCOUNTER — Other Ambulatory Visit: Payer: Self-pay

## 2014-01-29 MED ORDER — LEVOTHYROXINE SODIUM 112 MCG PO TABS: ORAL_TABLET | ORAL | Status: DC

## 2014-01-29 MED ORDER — LEVOTHYROXINE SODIUM 125 MCG PO TABS: ORAL_TABLET | ORAL | Status: DC

## 2014-02-13 ENCOUNTER — Encounter: Payer: Self-pay | Admitting: Family Medicine

## 2014-02-13 ENCOUNTER — Ambulatory Visit (INDEPENDENT_AMBULATORY_CARE_PROVIDER_SITE_OTHER): Payer: Medicare Other | Admitting: Family Medicine

## 2014-02-13 VITALS — BP 118/70 | HR 64 | Temp 98.2°F | Resp 16 | Ht 64.0 in | Wt 155.0 lb

## 2014-02-13 DIAGNOSIS — R5383 Other fatigue: Secondary | ICD-10-CM

## 2014-02-13 DIAGNOSIS — E039 Hypothyroidism, unspecified: Secondary | ICD-10-CM

## 2014-02-13 NOTE — Patient Instructions (Signed)
I will contact you about the appropriate thyroid medication dose once I have reviewed your results. Continue taking thyroid medication as you have been until you hear from our clinic.  Stay active; the regular exercise is good for you!  I will see you again in 6 months for complete annual exam.

## 2014-02-14 LAB — THYROID PANEL WITH TSH
Free Thyroxine Index: 2.3 (ref 1.4–3.8)
T3 UPTAKE: 34 % (ref 22–35)
T4 TOTAL: 6.7 ug/dL (ref 4.5–12.0)
TSH: 1.132 u[IU]/mL (ref 0.350–4.500)

## 2014-02-14 LAB — COMPLETE METABOLIC PANEL WITH GFR
ALT: 15 U/L (ref 0–35)
AST: 16 U/L (ref 0–37)
Albumin: 4 g/dL (ref 3.5–5.2)
Alkaline Phosphatase: 77 U/L (ref 39–117)
BUN: 17 mg/dL (ref 6–23)
CALCIUM: 9.2 mg/dL (ref 8.4–10.5)
CHLORIDE: 105 meq/L (ref 96–112)
CO2: 25 meq/L (ref 19–32)
CREATININE: 0.61 mg/dL (ref 0.50–1.10)
GFR, Est Non African American: >89 mL/min
Glucose, Bld: 84 mg/dL (ref 70–99)
Potassium: 4.7 mEq/L (ref 3.5–5.3)
Sodium: 140 mEq/L (ref 135–145)
Total Bilirubin: 0.4 mg/dL (ref 0.2–1.2)
Total Protein: 6.6 g/dL (ref 6.0–8.3)

## 2014-02-17 ENCOUNTER — Encounter: Payer: Self-pay | Admitting: Family Medicine

## 2014-02-17 NOTE — Progress Notes (Signed)
S:  This 73 y.o. Cauc female is here for medication management and labs monitoring thyroid disorder. Pt takes 2 different dosed of thyroid medication, alternating days of administration. Pt reports compliance with medication; she has a good appetite, stays active w/ yoga and other exercise classes and sleeps well. Despite that, she c/o mild fatigue; she does maintain that she has energy to do the activities on a daily basis. She denies fever/chills, abnormal weight loss, vision changes, CP or tightness, palpitations, SOB or DOE, cough, n/v/d, rashes or changes in skin/hair, HA, dizziness, numbness, weakness or syncope.  Patient Active Problem List   Diagnosis Date Noted  . Hypothyroidism 07/24/2013  . Bradycardia on ECG 07/24/2013  . Numbness 08/07/2012  . KNEE INJURY, RIGHT 10/03/2007  . HYPERLIPIDEMIA 12/29/2006    Prior to Admission medications   Medication Sig Start Date End Date Taking? Authorizing Provider  aspirin 81 MG tablet Take 81 mg by mouth daily.   Yes Historical Provider, MD  cholecalciferol (VITAMIN D) 1000 UNITS tablet Take 1,000 Units by mouth daily.   Yes Historical Provider, MD  fish oil-omega-3 fatty acids 1000 MG capsule Take 2 g by mouth daily.   Yes Historical Provider, MD  L-Methylfolate-B6-B12 (METANX PO) Take by mouth 2 (two) times daily.   Yes Historical Provider, MD  levothyroxine (SYNTHROID, LEVOTHROID) 112 MCG tablet Take 1 tablet of 112 mcg strength on Tues, Thurs, Sat, and Sun. Alternate with 125 mcg tablets on other days of week. 01/29/14  Yes Barton Fanny, MD  levothyroxine (SYNTHROID, LEVOTHROID) 125 MCG tablet Take 1 tablet of 125 mcg strength on Mon, Wed and Fri. Alternate days with 112 mcg dose on other days of week. 01/29/14  Yes Barton Fanny, MD  Misc Natural Products (OSTEO BI-FLEX JOINT SHIELD PO) Take by mouth as directed.   Yes Historical Provider, MD  Red Yeast Rice 600 MG TABS Take by mouth as directed.   Yes Historical Provider, MD    SOC and FAM Hx reviewed.  ROS: As per HPI.  O: Filed Vitals:   02/13/14 1134  BP: 118/70  Pulse: 64  Temp: 98.2 F (36.8 C)  Resp: 16   GEN: In NAD; WN,WD. Weight stable. HENT: [place]/AT; otherwise unremarkable. COR: RRR. No edema. LUNGS: CTA; normal resp rate and effort. SKIN; W&D; old faded scars from cat bite on hands. MS: MAEs; no c/c/e. Good muscle tone. NEURO: A&O x 3; CNs intact. Nonfocal.  A/P: Hypothyroidism, unspecified hypothyroidism type - Continue current medication dosing pending labs. Plan: Thyroid Panel With TSH  Other fatigue - Plan: COMPLETE METABOLIC PANEL WITH GFR

## 2014-02-20 ENCOUNTER — Telehealth: Payer: Self-pay | Admitting: *Deleted

## 2014-02-20 DIAGNOSIS — E782 Mixed hyperlipidemia: Secondary | ICD-10-CM

## 2014-02-20 NOTE — Telephone Encounter (Signed)
Pt will be by tomorrow morning. Looks like future order for FLP has already been placed.

## 2014-02-20 NOTE — Telephone Encounter (Signed)
-----   Message from Grant Fontana, [place] sent at 02/17/2014  3:26 PM EST -----   ----- Message -----    From: Grant Fontana, CMA    Sent: 02/17/2014   3:22 PM      To: [facility] Clinical Message Pool    ----- Message -----    From: Barton Fanny, MD    Sent: 02/17/2014  12:28 PM      To: [facility] Lab Pool  Can you run an LDL cholesterol ?

## 2014-02-20 NOTE — Telephone Encounter (Signed)
Tried to contact [facility] in regards to this and was unable to get a response. Would you like for [place] to call pt back in to have this drawn ?

## 2014-02-20 NOTE — Telephone Encounter (Signed)
It's best to have pt come in fasting and get a full lipid panel if she does not mind.  Thanks.

## 2014-02-24 ENCOUNTER — Other Ambulatory Visit (INDEPENDENT_AMBULATORY_CARE_PROVIDER_SITE_OTHER): Payer: Medicare Other | Admitting: Family Medicine

## 2014-02-24 DIAGNOSIS — E039 Hypothyroidism, unspecified: Secondary | ICD-10-CM

## 2014-02-24 DIAGNOSIS — E782 Mixed hyperlipidemia: Secondary | ICD-10-CM

## 2014-02-24 LAB — LIPID PANEL
Cholesterol: 198 mg/dL (ref 0–200)
HDL: 54 mg/dL (ref >39)
LDL Cholesterol: 126 mg/dL — ABNORMAL HIGH (ref 0–99)
Total CHOL/HDL Ratio: 3.7 Ratio
Triglycerides: 92 mg/dL (ref <150)
VLDL: 18 mg/dL (ref 0–40)

## 2014-02-24 NOTE — Progress Notes (Signed)
Patient here for labs only. 

## 2014-04-21 ENCOUNTER — Telehealth: Payer: Self-pay

## 2014-04-21 NOTE — Telephone Encounter (Signed)
Pt states they will not refill her LEVOTHYROXINE 112MG S, without an ov, but she have already had labs done and should be ok Please call [facility]

## 2014-04-23 MED ORDER — LEVOTHYROXINE SODIUM 112 MCG PO TABS: ORAL_TABLET | ORAL | Status: DC

## 2014-04-23 NOTE — Telephone Encounter (Signed)
Called pt who reported that [facility] told her that she had to call us to req Rx and all she needs is the 112 mcg at this time. Sent in RFs for pt.

## 2014-06-27 ENCOUNTER — Ambulatory Visit: Payer: Self-pay | Admitting: Family Medicine

## 2014-07-30 DIAGNOSIS — H2513 Age-related nuclear cataract, bilateral: Secondary | ICD-10-CM | POA: Diagnosis not present

## 2014-08-05 ENCOUNTER — Telehealth: Payer: Self-pay

## 2014-08-05 MED ORDER — LEVOTHYROXINE SODIUM 112 MCG PO TABS: ORAL_TABLET | ORAL | Status: DC

## 2014-08-05 NOTE — Telephone Encounter (Signed)
Needs refill on levothyroxen, .112 mg. [facility] mail order will not call our office for refill per patient.  Please call them at [phone]. RX # [id number]  Patient/Semiah  [phone]

## 2014-08-05 NOTE — Telephone Encounter (Signed)
Rx sent.  Pt notified. 

## 2014-08-13 ENCOUNTER — Other Ambulatory Visit: Payer: Self-pay

## 2014-08-13 MED ORDER — LEVOTHYROXINE SODIUM 112 MCG PO TABS: ORAL_TABLET | ORAL | Status: DC

## 2014-08-19 ENCOUNTER — Ambulatory Visit (INDEPENDENT_AMBULATORY_CARE_PROVIDER_SITE_OTHER): Payer: Medicare Other | Admitting: Family Medicine

## 2014-08-19 ENCOUNTER — Encounter: Payer: Self-pay | Admitting: Family Medicine

## 2014-08-19 VITALS — BP 131/73 | HR 51 | Temp 97.5°F | Resp 16 | Ht 63.5 in | Wt 149.4 lb

## 2014-08-19 DIAGNOSIS — Z23 Encounter for immunization: Secondary | ICD-10-CM

## 2014-08-19 DIAGNOSIS — Z0000 Encounter for general adult medical examination without abnormal findings: Secondary | ICD-10-CM | POA: Diagnosis not present

## 2014-08-19 DIAGNOSIS — R5383 Other fatigue: Secondary | ICD-10-CM

## 2014-08-19 LAB — CBC WITH DIFFERENTIAL/PLATELET
BASOS PCT: 1 % (ref 0–1)
Basophils Absolute: 0.1 10*3/uL (ref 0.0–0.1)
Eosinophils Absolute: 0.1 10*3/uL (ref 0.0–0.7)
Eosinophils Relative: 2 % (ref 0–5)
HCT: 34.4 % — ABNORMAL LOW (ref 36.0–46.0)
Hemoglobin: 11.1 g/dL — ABNORMAL LOW (ref 12.0–15.0)
Lymphocytes Relative: 34 % (ref 12–46)
Lymphs Abs: 2.5 10*3/uL (ref 0.7–4.0)
MCH: 28.4 pg (ref 26.0–34.0)
MCHC: 32.3 g/dL (ref 30.0–36.0)
MCV: 88 fL (ref 78.0–100.0)
MPV: 10.9 fL (ref 8.6–12.4)
Monocytes Absolute: 0.4 10*3/uL (ref 0.1–1.0)
Monocytes Relative: 6 % (ref 3–12)
NEUTROS PCT: 57 % (ref 43–77)
Neutro Abs: 4.2 10*3/uL (ref 1.7–7.7)
Platelets: 316 10*3/uL (ref 150–400)
RBC: 3.91 MIL/uL (ref 3.87–5.11)
RDW: 15.5 % (ref 11.5–15.5)
WBC: 7.4 10*3/uL (ref 4.0–10.5)

## 2014-08-19 NOTE — Progress Notes (Signed)
Subjective:    Patient ID: Jessica [name], female    DOB: 1940/09/27, 74 y.o.   MRN: [id number]  HPI This is a very pleasant 74 yo female who presents today for her annual exam. She feels that she is doing well in general but has noticed she has more muscle fatigue in her legs with walking. She has not noticed any swelling. The fatigue is relieved if she wears support stockings. She is going on a vacation next month to the [place] and is concerned about the amount of walking that she will be doing. She can currently "easily" walk 2 miles and is able to do all of her daily activities and household chores without difficulty. She has noticed she has to brace herself more when she weeds on a steep hill.    Last CPE- 5/15; she had labs drawn 12/15 except CBC (cmet, TSH normal, LDL slightly elevated) Mammo- 07/26/12- declines further screening Pap- not in several years; not sexually active Colonoscopy- 12/06- declines further screening. +1/3 Hemoccult 08/11/13- patient did not want further follow up. Tdap- 2010 Flu- annual Eye- recent Dental- regular Exercise- walks 2 miles (easily) at least 3x/ week. She has tried [employer]neakers exercise classes, but did not enjoy them. She swims occasionally.   Sleep- well  Past Medical History  Diagnosis Date  . High cholesterol   . Thyroid disorder   . Pericarditis 01/1999  . Pneumothorax 01/1999  . Osteopenia   . Vitamin B 12 deficiency   . Cataract    Past Surgical History  Procedure Laterality Date  . Tonsillectomy and adenoidectomy  1952?  Marland Kitchen Birthmark       removed,left leg   Family History  Problem Relation Age of Onset  . Diabetes Mother   . Asthma Mother   . Ataxia Father   . Cancer - Cervical Sister   . Thyroid disease Sister   . Diabetes Other   . Diabetes Maternal Grandmother   . Arthritis Paternal Grandmother   . Colon cancer Neg Hx    History  Substance Use Topics  . Smoking status: Never Smoker   . Smokeless  tobacco: Never Used  . Alcohol Use: 1.8 oz/week    3 Glasses of wine per week  Medications, allergies, past medical history, surgical history, family history, social history and problem list reviewed and updated.  Review of Systems  Constitutional: Positive for fatigue.  HENT: Negative.   Eyes: Negative.   Respiratory: Positive for shortness of breath.        On occasion when walking up hill. Never with stairs, flat surfaces or her regular exercise.   Cardiovascular: Negative.   Gastrointestinal: Negative.   Endocrine: Negative.   Genitourinary: Negative.   Musculoskeletal: Positive for myalgias and back pain (has occasional "twinge" of pain and ache. Resolves quickly. She never takes any medciation for this. ).  Skin: Negative.   Allergic/Immunologic: Negative.   Neurological: Positive for weakness.  Hematological: Negative.   Psychiatric/Behavioral: Negative.       Objective:   Physical Exam Physical Exam  Constitutional: She is oriented to person, place, and time. She appears well-developed and well-nourished. No distress.  HENT:  Head: Normocephalic and atraumatic.  Right Ear: External ear normal.  Left Ear: External ear normal.  Nose: Nose normal.  Mouth/Throat: Oropharynx is clear and moist. No oropharyngeal exudate.  Eyes: Conjunctivae are normal. Pupils are equal, round, and reactive to light.  Neck: Normal range of motion. Neck supple. No JVD present.  No thyromegaly present.  Cardiovascular: Normal rate, regular rhythm, normal heart sounds and intact distal pulses.   Pulmonary/Chest: Effort normal and breath sounds normal. Right breast exhibits no inverted nipple, no mass, no nipple discharge, no skin change and no tenderness. Left breast exhibits no inverted nipple, no mass, no nipple discharge, no skin change and no tenderness. Breasts are symmetrical.  Abdominal: Soft. Bowel sounds are normal. She exhibits no distension and no mass. There is no tenderness. There is no  rebound and no guarding.  Musculoskeletal: Normal range of motion. She exhibits no edema or tenderness. Strength 5/5 throughout. Lymphadenopathy:    She has no cervical adenopathy.  Neurological: She is alert and oriented to person, place, and time. She has normal reflexes.  Skin: Skin is warm and dry. She is not diaphoretic.  Psychiatric: She has a normal mood and affect. Her behavior is normal. Judgment and thought content normal.  Vitals reviewed.  BP 131/73 mmHg  Pulse 51  Temp(Src) 97.5 F (36.4 C) (Oral)  Resp 16  Ht 5' 3.5" (1.613 m)  Wt 149 lb 6.4 oz (67.767 kg)  BMI 26.05 kg/m2  SpO2 99%  Wt Readings from Last 3 Encounters:  08/19/14 149 lb 6.4 oz (67.767 kg)  02/13/14 155 lb (70.308 kg)  01/28/14 158 lb (71.668 kg)      Assessment & Plan:  1. Medicare annual wellness visit, subsequent - reviewed objective findings of 5/5 strength and normal exam with patient. - Encouraged continued regular exercise and increased duration of walking in anticipation of trip. Also advised her to add strength and flexibility training to her routine. 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   2. Other fatigue - CBC with Differential  3. Need for prophylactic vaccination with Streptococcus pneumoniae (Pneumococcus) and Influenza vaccines - Pneumococcal conjugate vaccine 13-valent IM   Clarene Reamer, FNP-BC  [facility]  08/21/2014 9:45 PM

## 2014-08-21 ENCOUNTER — Encounter: Payer: Medicare Other | Admitting: Family Medicine

## 2014-08-21 ENCOUNTER — Other Ambulatory Visit: Payer: Self-pay | Admitting: Family Medicine

## 2014-08-21 DIAGNOSIS — D649 Anemia, unspecified: Secondary | ICD-10-CM

## 2014-10-08 ENCOUNTER — Ambulatory Visit (INDEPENDENT_AMBULATORY_CARE_PROVIDER_SITE_OTHER): Payer: Medicare Other | Admitting: Family Medicine

## 2014-10-08 VITALS — BP 130/82 | HR 146 | Temp 98.0°F | Resp 18 | Ht 63.5 in | Wt 151.0 lb

## 2014-10-08 DIAGNOSIS — D649 Anemia, unspecified: Secondary | ICD-10-CM | POA: Diagnosis not present

## 2014-10-08 DIAGNOSIS — E039 Hypothyroidism, unspecified: Secondary | ICD-10-CM

## 2014-10-08 DIAGNOSIS — R002 Palpitations: Secondary | ICD-10-CM

## 2014-10-08 LAB — POCT CBC
GRANULOCYTE PERCENT: 58.9 % (ref 37–80)
HCT, POC: 35.1 % — AB (ref 37.7–47.9)
Hemoglobin: 11.4 g/dL — AB (ref 12.2–16.2)
Lymph, poc: 2.8 (ref 0.6–3.4)
MCH, POC: 27.8 pg (ref 27–31.2)
MCHC: 32.4 g/dL (ref 31.8–35.4)
MCV: 85.7 fL (ref 80–97)
MID (CBC): 0.3 (ref 0–0.9)
MPV: 8.1 fL (ref 0–99.8)
PLATELET COUNT, POC: 288 10*3/uL (ref 142–424)
POC Granulocyte: 4.5 (ref 2–6.9)
POC LYMPH PERCENT: 36.5 %L (ref 10–50)
POC MID %: 4.6 %M (ref 0–12)
RBC: 4.1 M/uL (ref 4.04–5.48)
RDW, POC: 15.2 %
WBC: 7.6 10*3/uL (ref 4.6–10.2)

## 2014-10-08 LAB — TSH: TSH: 2.762 u[IU]/mL (ref 0.350–4.500)

## 2014-10-08 MED ORDER — LEVOTHYROXINE SODIUM 112 MCG PO TABS: ORAL_TABLET | ORAL | Status: DC

## 2014-10-08 MED ORDER — LEVOTHYROXINE SODIUM 125 MCG PO TABS: ORAL_TABLET | ORAL | Status: DC

## 2014-10-08 NOTE — Progress Notes (Addendum)
Subjective:    Patient ID: Jessica [name], female    DOB: 02-Apr-1940, 74 y.o.   MRN: [id number] This chart was scribed for Merri Ray, MD by Marti Sleigh, Medical Scribe. This patient was seen in Room 10 and the patient's care was started a 8:55 AM.  Chief Complaint  Patient presents with  . Medication Refill    synthroid     HPI HPI Comments: Jessica [name] is a 74 y.o. female who presents to [facility] reporting for a refill of her synthroid. On check in her heart rate was elevated at 146, and her O2 sat was 93% so she was made next to be seen. Pt was asked about her elevated heart rate, and stated she was only aware of an elevated HR at night which sometimes wakes her up in the middle of the night, lasts a few minutes and passes. She thinks this has been going on for perhaps three months. She denies irregular heart beat. She denies CP or difficulty breathing when she has these sx. Pt had a positive hemocult fecal test in June 2015. Last cholesterol in December of last year. Pt states she had anemia years ago. The pt had a low risk stress test with Dr. Daneen Schick in September 2015. She denies dark or tarry stools, endorses some intermittent blood in stools which she associates with hemorrhoids. Pt states she also has an intermittent hand tremor. The pt was last seen June 14 at [facility] and at that visit it was noted that she had a slight decrease in hemoglobin  from 12.1 to 11.1.  Pt states she misses her synthroid medication maybe once per month. She currently takes 150mcg and 125 on alternating days. Denies side effects with synthroid, including increased sensation of heat or cold.    Lab Results  Component Value Date   TSH 1.132 02/13/2014   Patient Active Problem List   Diagnosis Date Noted  . Hypothyroidism 07/24/2013  . Bradycardia on ECG 07/24/2013  . Numbness 08/07/2012  . KNEE INJURY, RIGHT 10/03/2007  . HYPERLIPIDEMIA 12/29/2006   Past Medical History  Diagnosis Date  .  High cholesterol   . Thyroid disorder   . Pericarditis 01/1999  . Pneumothorax 01/1999  . Osteopenia   . Vitamin B 12 deficiency   . Cataract    Past Surgical History  Procedure Laterality Date  . Tonsillectomy and adenoidectomy  1952?  Marland Kitchen Birthmark       removed,left leg   Allergies  Allergen Reactions  . Ansaid [Flurbiprofen]     Stomach upset  . Atorvastatin     Muscle weakness  . Ibuprofen     Stomach upset  . Lorazepam     Per pt: unknown  . Nsaids     Stomach upset  . Rosuvastatin     Muscle weaknes   Prior to Admission medications   Medication Sig Start Date End Date Taking? Authorizing Provider  aspirin 81 MG tablet Take 325 mg by mouth daily. Taking 325 every other day   Yes Historical Provider, MD  cholecalciferol (VITAMIN D) 1000 UNITS tablet Take 1,000 Units by mouth daily.   Yes Historical Provider, MD  co-enzyme Q-10 30 MG capsule Take 30 mg by mouth 3 (three) times daily.   Yes Historical Provider, MD  fish oil-omega-3 fatty acids 1000 MG capsule Take 2 g by mouth daily.   Yes Historical Provider, MD  L-Methylfolate-B6-B12 (METANX PO) Take by mouth 2 (two) times daily.   Yes  Historical Provider, MD  levothyroxine (SYNTHROID, LEVOTHROID) 112 MCG tablet Take 1 tablet of 112 mcg strength on Tues, Thurs, Sat, and Sun. Alternate with 125 mcg tablets on other days of week. 08/13/14  Yes Wardell Honour, MD  levothyroxine (SYNTHROID, LEVOTHROID) 125 MCG tablet Take 1 tablet of 125 mcg strength on Mon, Wed and Fri. Alternate days with 112 mcg dose on other days of week. 01/29/14  Yes Barton Fanny, MD  Misc Natural Products (OSTEO BI-FLEX JOINT SHIELD PO) Take by mouth as directed.   Yes Historical Provider, MD  Red Yeast Rice 600 MG TABS Take by mouth as directed.   Yes Historical Provider, MD   History   Social History  . Marital Status: Divorced    Spouse Name: N/A  . Number of Children: 1  . Years of Education: college   Occupational History  . retired      Social History Main Topics  . Smoking status: Never Smoker   . Smokeless tobacco: Never Used  . Alcohol Use: 1.8 oz/week    3 Glasses of wine per week  . Drug Use: No  . Sexual Activity: Not on file   Other Topics Concern  . Not on file   Social History Narrative   She lives alone, retired, has one child.  No smoke, drink occasionally   Education- college three masters.   Right handed.   Caffeine- Coffee and tea daily.    Review of Systems  Constitutional: Negative for fatigue and unexpected weight change.  Respiratory: Negative for chest tightness and shortness of breath.   Cardiovascular: Positive for palpitations. Negative for chest pain and leg swelling.  Gastrointestinal: Negative for abdominal pain and blood in stool.  Neurological: Negative for dizziness, syncope, light-headedness and headaches.       Objective:   Physical Exam  Constitutional: She is oriented to person, place, and time. She appears well-developed and well-nourished. No distress.  HENT:  Head: Normocephalic and atraumatic.  Eyes: Conjunctivae and EOM are normal. Pupils are equal, round, and reactive to light.  Neck: Neck supple. Carotid bruit is not present.  Cardiovascular: Normal rate, regular rhythm, normal heart sounds and intact distal pulses.  Exam reveals no gallop and no friction rub.   No murmur heard. RRR, no murmurs rubs gallops.   Pulmonary/Chest: Effort normal and breath sounds normal. No respiratory distress.  Abdominal: Soft. She exhibits no pulsatile midline mass. There is no tenderness.  Musculoskeletal: Normal range of motion.  Neurological: She is alert and oriented to person, place, and time. Coordination normal.  Skin: Skin is warm and dry. She is not diaphoretic.  Psychiatric: She has a normal mood and affect. Her behavior is normal.  Nursing note and vitals reviewed.  Filed Vitals:   10/08/14 0845  BP: 130/82  Pulse: 146  Temp: 98 F (36.7 C)  TempSrc: Oral  Resp:  18  Height: 5' 3.5" (1.613 m)  Weight: 151 lb (68.493 kg)  SpO2: 93%    EKG: Sinus bradycardia with a rate of 54. No apparent changes when compared to EKG May 2015  Results for orders placed or performed in visit on 10/08/14  POCT CBC  Result Value Ref Range   WBC 7.6 4.6 - 10.2 K/uL   Lymph, poc 2.8 0.6 - 3.4   POC LYMPH PERCENT 36.5 10 - 50 %L   MID (cbc) 0.3 0 - 0.9   POC MID % 4.6 0 - 12 %M   POC Granulocyte 4.5 2 -  6.9   Granulocyte percent 58.9 37 - 80 %G   RBC 4.10 4.04 - 5.48 M/uL   Hemoglobin 11.4 (A) 12.2 - 16.2 g/dL   HCT, POC 35.1 (A) 37.7 - 47.9 %   MCV 85.7 80 - 97 fL   MCH, POC 27.8 27 - 31.2 pg   MCHC 32.4 31.8 - 35.4 g/dL   RDW, POC 15.2 %   Platelet Count, POC 288 142 - 424 K/uL   MPV 8.1 0 - 99.8 fL       Assessment & Plan:   Jessica [name] is a 74 y.o. female Hypothyroidism, unspecified hypothyroidism type - Plan: TSH, levothyroxine (SYNTHROID, LEVOTHROID) 112 MCG tablet, levothyroxine (SYNTHROID, LEVOTHROID) 125 MCG tablet  -Prior controlled. Will recheck TSH, continue medications the same doses for now.  Palpitations - Plan: POCT CBC, EKG 12-Lead, Ambulatory referral to Cardiology, CANCELED: Ambulatory referral to Cardiology  -Nocturnal, intermittent. She was tachycardic on initial presentation here, discussed with initial triager that she did check her pulse and it was also fast.  Heart rate had normalized by time I saw her on exam. EKG reassuring. Differential includes paroxysmal atrial fibrillation.  She is currently on aspirin a few times per week. We'll have her evaluated by cardiology as may need Holter or other monitoring to capture these events. If symptoms increase or any associated shortness of breath or chest pain, advised to go to emergency room.  Anemia, unspecified anemia type - Plan: POCT CBC  -Still borderline low, but has improved slightly from last visit. Last colonoscopy 10 years ago and discussed at last physical did not plan on  repeating this. Agreed to recheck CBC one more time in 6 weeks, as improved today. However if still low, would recommend consult with gastroenterology for possible repeat colonoscopy.  Meds ordered this encounter  Medications  . levothyroxine (SYNTHROID, LEVOTHROID) 112 MCG tablet    Sig: Take 1 tablet of 112 mcg strength on Tues, Thurs, Sat, and Sun. Alternate with 125 mcg tablets on other days of week.    Dispense:  60 tablet    Refill:  5  . levothyroxine (SYNTHROID, LEVOTHROID) 125 MCG tablet    Sig: Take 1 tablet of 125 mcg strength on Mon, Wed and Fri. Alternate days with 112 mcg dose on other days of week.    Dispense:  40 tablet    Refill:  5   Patient Instructions  Make sure you having a normal diet including iron containing foods, but recheck with data Carlean Purl, myself or other provider in the next 6 weeks to repeat the blood count will more time. If it is still below normal, I would recommend you meet with the gastroenterologist to determine if another colonoscopy is indicated.  I will refer you back to Dr. Tamala Julian for evaluation of your palpitations. If you get lightheaded, dizzy, shortness of breath, or chest pain with these symptoms, call 911 or go to the emergency room.  You should receive a call or letter about your lab results within the next week to 10 days.   Palpitations A palpitation is the feeling that your heartbeat is irregular or is faster than normal. It may feel like your heart is fluttering or skipping a beat. Palpitations are usually not a serious problem. However, in some cases, you may need further medical evaluation. CAUSES  Palpitations can be caused by:  Smoking.  Caffeine or other stimulants, such as diet pills or energy drinks.  Alcohol.  Stress and anxiety.  Strenuous  physical activity.  Fatigue.  Certain medicines.  Heart disease, especially if you have a history of irregular heart rhythms (arrhythmias), such as atrial fibrillation, atrial  flutter, or supraventricular tachycardia.  An improperly working pacemaker or defibrillator. DIAGNOSIS  To find the cause of your palpitations, your health care provider will take your medical history and perform a physical exam. Your health care provider may also have you take a test called an ambulatory electrocardiogram (ECG). An ECG records your heartbeat patterns over a 24-hour period. You may also have other tests, such as:  Transthoracic echocardiogram (TTE). During echocardiography, sound waves are used to evaluate how blood flows through your heart.  Transesophageal echocardiogram (TEE).  Cardiac monitoring. This allows your health care provider to monitor your heart rate and rhythm in real time.  Holter monitor. This is a portable device that records your heartbeat and can help diagnose heart arrhythmias. It allows your health care provider to track your heart activity for several days, if needed.  Stress tests by exercise or by giving medicine that makes the heart beat faster. TREATMENT  Treatment of palpitations depends on the cause of your symptoms and can vary greatly. Most cases of palpitations do not require any treatment other than time, relaxation, and monitoring your symptoms. Other causes, such as atrial fibrillation, atrial flutter, or supraventricular tachycardia, usually require further treatment. HOME CARE INSTRUCTIONS   Avoid:  Caffeinated coffee, tea, soft drinks, diet pills, and energy drinks.  Chocolate.  Alcohol.  Stop smoking if you smoke.  Reduce your stress and anxiety. Things that can help you relax include:  A method of controlling things in your body, such as your heartbeats, with your mind (biofeedback).  Yoga.  Meditation.  Physical activity such as swimming, jogging, or walking.  Get plenty of rest and sleep. SEEK MEDICAL CARE IF:   You continue to have a fast or irregular heartbeat beyond 24 hours.  Your palpitations occur more  often. SEEK IMMEDIATE MEDICAL CARE IF:  You have chest pain or shortness of breath.  You have a severe headache.  You feel dizzy or you faint. MAKE SURE YOU:  Understand these instructions.  Will watch your condition.  Will get help right away if you are not doing well or get worse. Document Released: 02/19/2000 Document Revised: 02/26/2013 Document Reviewed: 04/22/2011 [facility] Patient Information 2015 [place], [place]. This information is not intended to replace advice given to you by your health care provider. Make sure you discuss any questions you have with your health care provider.

## 2014-10-08 NOTE — Patient Instructions (Signed)
Make sure you having a normal diet including iron containing foods, but recheck with data Carlean Purl, myself or other provider in the next 6 weeks to repeat the blood count will more time. If it is still below normal, I would recommend you meet with the gastroenterologist to determine if another colonoscopy is indicated.  I will refer you back to Dr. Tamala Julian for evaluation of your palpitations. If you get lightheaded, dizzy, shortness of breath, or chest pain with these symptoms, call 911 or go to the emergency room.  You should receive a call or letter about your lab results within the next week to 10 days.   Palpitations A palpitation is the feeling that your heartbeat is irregular or is faster than normal. It may feel like your heart is fluttering or skipping a beat. Palpitations are usually not a serious problem. However, in some cases, you may need further medical evaluation. CAUSES  Palpitations can be caused by:  Smoking.  Caffeine or other stimulants, such as diet pills or energy drinks.  Alcohol.  Stress and anxiety.  Strenuous physical activity.  Fatigue.  Certain medicines.  Heart disease, especially if you have a history of irregular heart rhythms (arrhythmias), such as atrial fibrillation, atrial flutter, or supraventricular tachycardia.  An improperly working pacemaker or defibrillator. DIAGNOSIS  To find the cause of your palpitations, your health care provider will take your medical history and perform a physical exam. Your health care provider may also have you take a test called an ambulatory electrocardiogram (ECG). An ECG records your heartbeat patterns over a 24-hour period. You may also have other tests, such as:  Transthoracic echocardiogram (TTE). During echocardiography, sound waves are used to evaluate how blood flows through your heart.  Transesophageal echocardiogram (TEE).  Cardiac monitoring. This allows your health care provider to monitor your heart rate  and rhythm in real time.  Holter monitor. This is a portable device that records your heartbeat and can help diagnose heart arrhythmias. It allows your health care provider to track your heart activity for several days, if needed.  Stress tests by exercise or by giving medicine that makes the heart beat faster. TREATMENT  Treatment of palpitations depends on the cause of your symptoms and can vary greatly. Most cases of palpitations do not require any treatment other than time, relaxation, and monitoring your symptoms. Other causes, such as atrial fibrillation, atrial flutter, or supraventricular tachycardia, usually require further treatment. HOME CARE INSTRUCTIONS   Avoid:  Caffeinated coffee, tea, soft drinks, diet pills, and energy drinks.  Chocolate.  Alcohol.  Stop smoking if you smoke.  Reduce your stress and anxiety. Things that can help you relax include:  A method of controlling things in your body, such as your heartbeats, with your mind (biofeedback).  Yoga.  Meditation.  Physical activity such as swimming, jogging, or walking.  Get plenty of rest and sleep. SEEK MEDICAL CARE IF:   You continue to have a fast or irregular heartbeat beyond 24 hours.  Your palpitations occur more often. SEEK IMMEDIATE MEDICAL CARE IF:  You have chest pain or shortness of breath.  You have a severe headache.  You feel dizzy or you faint. MAKE SURE YOU:  Understand these instructions.  Will watch your condition.  Will get help right away if you are not doing well or get worse. Document Released: 02/19/2000 Document Revised: 02/26/2013 Document Reviewed: 04/22/2011 [facility] Patient Information 2015 [place], [place]. This information is not intended to replace advice given to you by  your health care provider. Make sure you discuss any questions you have with your health care provider.  

## 2014-11-04 ENCOUNTER — Ambulatory Visit (INDEPENDENT_AMBULATORY_CARE_PROVIDER_SITE_OTHER): Payer: Medicare Other | Admitting: Cardiology

## 2014-11-04 ENCOUNTER — Encounter (INDEPENDENT_AMBULATORY_CARE_PROVIDER_SITE_OTHER): Payer: Medicare Other

## 2014-11-04 ENCOUNTER — Encounter: Payer: Self-pay | Admitting: Cardiology

## 2014-11-04 VITALS — BP 134/78 | HR 58 | Ht 63.0 in | Wt 151.0 lb

## 2014-11-04 DIAGNOSIS — R002 Palpitations: Secondary | ICD-10-CM

## 2014-11-04 NOTE — Patient Instructions (Addendum)
Medication Instructions:  -No change  Labwork: -None  Testing/Procedures: Your physician has recommended that you wear a holter monitor for 48 HOURS. DX: PALPITATIONS.  Holter monitors are medical devices that record the heart's electrical activity. Doctors most often use these monitors to diagnose arrhythmias. Arrhythmias are problems with the speed or rhythm of the heartbeat. The monitor is a small, portable device. You can wear one while you do your normal daily activities. This is usually used to diagnose what is causing palpitations/syncope (passing out).    Follow-Up:  Scheduled follow up based on results from monitor  Any Other Special Instructions Will Be Listed Below (If Applicable).

## 2014-11-04 NOTE — Progress Notes (Signed)
11/04/2014 Jessica [name]   [birth year]  [id number]  Primary Physician No PCP Per Patient Primary Cardiologist: Dr. Tamala Julian   Reason for Visit/CC: Palpitations  HPI:  The patient is a 74 year old female with a history of hyperlipidemia and hypothyroidism on levothyroxine. In September 2015 she was for referred to Dr. Daneen Schick for evaluation of exertional chest pain. She underwent nuclear stress testing which was low risk for ischemia.  She presents back to clinic today because she was referred by her PCP for evaluation of palpitations. She notes that at her PCP office on 10/08/14 she was noted to have an abnormal pulse rate. However her EKG from 10/08/2014 revealed normal sinus rhythm.   She reports that almost was every morning she has palpitations that last less than 10 minutes. She denies any significant dyspnea or chest discomfort. No dizziness, syncope/near-syncope. No h/o stroke/TIA, HTN, DM or tobacco abuse.   She has a trip scheduled for [place] in 2 weeks.    Current Outpatient Prescriptions  Medication Sig Dispense Refill  . cholecalciferol (VITAMIN D) 1000 UNITS tablet Take 1,000 Units by mouth daily.    Marland Kitchen co-enzyme Q-10 30 MG capsule Take 30 mg by mouth 3 (three) times daily.    . fish oil-omega-3 fatty acids 1000 MG capsule Take 2 g by mouth daily.    Marland Kitchen L-Methylfolate-B6-B12 (METANX PO) Take by mouth 2 (two) times daily.    Marland Kitchen levothyroxine (SYNTHROID, LEVOTHROID) 112 MCG tablet Take 1 tablet of 112 mcg strength on Tues, Thurs, Sat, and Sun. Alternate with 125 mcg tablets on other days of week. 60 tablet 5  . levothyroxine (SYNTHROID, LEVOTHROID) 125 MCG tablet Take 1 tablet of 125 mcg strength on Mon, Wed and Fri. Alternate days with 112 mcg dose on other days of week. 40 tablet 5  . Misc Natural Products (OSTEO BI-FLEX JOINT SHIELD PO) Take 2 tablets by mouth as directed.     . Red Yeast Rice 600 MG TABS Take 1 tablet by mouth daily.      No current facility-administered  medications for this visit.    Allergies  Allergen Reactions  . Ansaid [Flurbiprofen]     Stomach upset  . Atorvastatin     Muscle weakness  . Ibuprofen     Stomach upset  . Lorazepam     Per pt: unknown  . Nsaids     Stomach upset  . Rosuvastatin     Muscle weaknes    Social History   Social History  . Marital Status: Divorced    Spouse Name: N/A  . Number of Children: 1  . Years of Education: college   Occupational History  . retired    Social History Main Topics  . Smoking status: Never Smoker   . Smokeless tobacco: Never Used  . Alcohol Use: 1.8 oz/week    3 Glasses of wine per week  . Drug Use: No  . Sexual Activity: Not on file   Other Topics Concern  . Not on file   Social History Narrative   She lives alone, retired, has one child.  No smoke, drink occasionally   Education- college three masters.   Right handed.   Caffeine- Coffee and tea daily.     Review of Systems: General: negative for chills, fever, night sweats or weight changes.  Cardiovascular: negative for chest pain, dyspnea on exertion, edema, orthopnea, palpitations, paroxysmal nocturnal dyspnea or shortness of breath Dermatological: negative for rash Respiratory: negative for cough or wheezing Urologic:  negative for hematuria Abdominal: negative for nausea, vomiting, diarrhea, bright red blood per rectum, melena, or hematemesis Neurologic: negative for visual changes, syncope, or dizziness All other systems reviewed and are otherwise negative except as noted above.    Blood pressure 134/78, pulse 58, height 5\' 3"  (1.6 m), weight 151 lb (68.493 kg), SpO2 95 %.  General appearance: alert, cooperative and no distress Neck: no carotid bruit and no JVD Lungs: clear to auscultation bilaterally Heart: regular rate and rhythm, S1, S2 normal, no murmur, click, rub or gallop Extremities: no LEE Pulses: 2+ and symmetric Skin: warm and dry Neurologic: Grossly normal  EKG not performed;  physical exam: RRR  ASSESSMENT AND PLAN:   1. Palpitations: EKG from 10/08/2014 demonstrated normal sinus rhythm. Pulse rate is 58 bpm. Physical exam today is notable for regular rate and rhythm. She is currently asymptomatic. Recent TSH 10/08/14 was WNL. Will plan for further evaluation with a 48 hour heart monitor.  PLAN  plan follow-up based on cardiac monitoring results. If no significant findings, she can continue follow-up as needed  Lyda Jester PA-C 11/04/2014 10:53 AM

## 2014-11-12 ENCOUNTER — Encounter: Payer: Self-pay | Admitting: Family Medicine

## 2014-11-12 ENCOUNTER — Ambulatory Visit (INDEPENDENT_AMBULATORY_CARE_PROVIDER_SITE_OTHER): Payer: Medicare Other | Admitting: Family Medicine

## 2014-11-12 VITALS — BP 124/84 | HR 68 | Temp 97.9°F | Resp 16 | Ht 65.0 in | Wt 151.6 lb

## 2014-11-12 DIAGNOSIS — W19XXXA Unspecified fall, initial encounter: Secondary | ICD-10-CM

## 2014-11-12 DIAGNOSIS — Z23 Encounter for immunization: Secondary | ICD-10-CM | POA: Diagnosis not present

## 2014-11-12 DIAGNOSIS — D649 Anemia, unspecified: Secondary | ICD-10-CM

## 2014-11-12 LAB — CBC WITH DIFFERENTIAL/PLATELET
BASOS ABS: 0 10*3/uL (ref 0.0–0.1)
BASOS PCT: 0 % (ref 0–1)
EOS ABS: 0.1 10*3/uL (ref 0.0–0.7)
EOS PCT: 1 % (ref 0–5)
HCT: 34.8 % — ABNORMAL LOW (ref 36.0–46.0)
Hemoglobin: 11.1 g/dL — ABNORMAL LOW (ref 12.0–15.0)
Lymphocytes Relative: 24 % (ref 12–46)
Lymphs Abs: 2.2 10*3/uL (ref 0.7–4.0)
MCH: 28 pg (ref 26.0–34.0)
MCHC: 31.9 g/dL (ref 30.0–36.0)
MCV: 87.9 fL (ref 78.0–100.0)
MPV: 10.9 fL (ref 8.6–12.4)
Monocytes Absolute: 0.6 10*3/uL (ref 0.1–1.0)
Monocytes Relative: 7 % (ref 3–12)
NEUTROS PCT: 68 % (ref 43–77)
Neutro Abs: 6.1 10*3/uL (ref 1.7–7.7)
PLATELETS: 295 10*3/uL (ref 150–400)
RBC: 3.96 MIL/uL (ref 3.87–5.11)
RDW: 15.1 % (ref 11.5–15.5)
WBC: 9 10*3/uL (ref 4.0–10.5)

## 2014-11-12 NOTE — Progress Notes (Signed)
   Subjective:    Patient ID: Jessica [name], female    DOB: 1940/08/15, 74 y.o.   MRN: [id number]  HPI This is a pleasant 74 yo female who presents today for follow up of mild normochromic, normocytic anemia. She reports that she had previously decreased her Metanx, but has since resumed taking BID.   She was walking outside in sandals that were too large and tripped yesterday, falling on her right side. She hit her head and knee. She did not loose consciousness or see stars. She denies headache, visual changes, nausea or vomiting. She has some swelling of upper right eyelid and right knee feels swollen and stiff. She does not have enough pain to take any medication or apply ice.   She had a 48 hour holter placed last week for palpitations. She continues to have daily am palpitations. No chest pain, no dizziness, no SOB/DOE. She is waiting for the results. She is going to [place] next week. She has been walking more and walked 5 miles recently.   Review of Systems Per HPI    Objective:   Physical Exam Physical Exam  Constitutional: Oriented to person, place, and time. She appears well-developed and well-nourished.  HENT:  Head: Normocephalic.  Eyes: Conjunctivae are normal. PERRLA, no discharge. Right upper eyelid ecchymotic,  Neck: Normal range of motion. Neck supple.  Cardiovascular: Normal rate, regular rhythm and normal heart sounds.   Pulmonary/Chest: Effort normal and breath sounds normal.  Musculoskeletal: Normal range of motion. Right knee mildly edematous, no erythema/discoloration, generalized tenderness.  Neurological: Alert and oriented to person, place, and time.  Skin: Skin is warm and dry.  Psychiatric: Normal mood and affect. Behavior is normal. Judgment and thought content normal.  Vitals reviewed. BP 124/84 mmHg  Pulse 68  Temp(Src) 97.9 F (36.6 C) (Oral)  Resp 16  Ht 5\' 5"  (1.651 m)  Wt 151 lb 9.6 oz (68.765 kg)  BMI 25.23 kg/m2 Wt Readings from Last 3  Encounters:  11/12/14 151 lb 9.6 oz (68.765 kg)  11/04/14 151 lb (68.493 kg)  10/08/14 151 lb (68.493 kg)      Assessment & Plan:  1. Anemia, unspecified anemia type - CBC with Differential/Platelet  2. Fall, initial encounter - Can apply ice to swollen eyelid and knee if she desires and can take OTC analgesics.  - RTC precautions reviewed  3. Need for influenza vaccination - Flu Vaccine QUAD 36+ mos IM   Clarene Reamer, FNP-BC  [facility]  11/15/2014 6:57 AM

## 2014-12-11 ENCOUNTER — Telehealth: Payer: Self-pay | Admitting: *Deleted

## 2014-12-11 NOTE — Telephone Encounter (Signed)
-----   Message from [place], [place] sent at 11/18/2014 11:59 AM EDT -----     Basic underlying rhythm is sinus rhythm and sinus bradycardia     Heart rate range 47-85 bpm with a mean of 59 bpm.     PACs and PVCs     No significant pauses or tachyarrhythmias were noted.  Heart monitor ok. In the lack of symptoms, will need no further w/u at this time. Continue to follow-up with Dr. Tamala Julian as needed.

## 2014-12-11 NOTE — Telephone Encounter (Signed)
Called pt to giver her the results below from her heart monitor.  Per Ellen Henri, PA-C, everything was ok and that she just needed to follow up with Dr. Tamala Julian as needed.  Pt verbalized understanding

## 2014-12-26 DIAGNOSIS — L821 Other seborrheic keratosis: Secondary | ICD-10-CM | POA: Diagnosis not present

## 2015-01-05 ENCOUNTER — Ambulatory Visit (INDEPENDENT_AMBULATORY_CARE_PROVIDER_SITE_OTHER): Payer: Medicare Other | Admitting: Family Medicine

## 2015-01-05 ENCOUNTER — Encounter: Payer: Self-pay | Admitting: Family Medicine

## 2015-01-05 VITALS — BP 109/72 | HR 60 | Temp 97.9°F | Resp 16 | Ht 65.0 in | Wt 153.0 lb

## 2015-01-05 DIAGNOSIS — E039 Hypothyroidism, unspecified: Secondary | ICD-10-CM

## 2015-01-05 DIAGNOSIS — E782 Mixed hyperlipidemia: Secondary | ICD-10-CM

## 2015-01-05 DIAGNOSIS — Z111 Encounter for screening for respiratory tuberculosis: Secondary | ICD-10-CM | POA: Diagnosis not present

## 2015-01-05 NOTE — Progress Notes (Signed)
   Subjective:    Patient ID: EVELYNN HENCH, female    DOB: [birth year], 74 y.o.   MRN: [id number]  HPI This is a pleasant 74 yo female who presents today for paperwork completion to move to [facility] (a retirement community).   The patient reports she has been doing well. She went to [place] over the summer and did well with the activity level. She has noticed a decrease in her balance over the years. 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    Review of Systems  Constitutional: Negative for fever, chills and fatigue.  Respiratory: Negative for cough, chest tightness and shortness of breath.   Cardiovascular: Negative for chest pain and leg swelling.  Gastrointestinal: Negative for abdominal pain, diarrhea and constipation.      Objective:   Physical Exam Physical Exam  Constitutional: Oriented to person, place, and time. She appears well-developed and well-nourished.  HENT:  Head: Normocephalic and atraumatic.  Eyes: Conjunctivae are normal.  Neck: Normal range of motion. Neck supple.  Cardiovascular: Normal rate, regular rhythm and normal heart sounds.   Pulmonary/Chest: Effort normal and breath sounds normal.  Musculoskeletal: Normal range of motion.  Neurological: Alert and oriented to person, place, and time.  Skin: Skin is warm and dry.  Psychiatric: Normal mood and affect. Behavior is normal. Judgment and thought content normal.  Vitals reviewed.  BP 109/72 mmHg  Pulse 60  Temp(Src) 97.9 F (36.6 C)  Resp 16  Ht $R'5\' 5"'Fz$  (1.651 m)  Wt 153 lb (69.4 kg)  BMI 25.46 kg/m2 Wt Readings from Last 3 Encounters:  01/05/15 153 lb (69.4 kg)  11/12/14 151 lb 9.6 oz (68.765 kg)  11/04/14 151 lb (68.493 kg)   Depression screen [facility] 2/9 01/05/2015 11/12/2014 10/08/2014 08/19/2014 07/24/2013  Decreased Interest 0 0 0 0 0  Down, Depressed, Hopeless 0 0 0 0 0  PHQ - 2 Score 0 0 0 0 0        Assessment & Plan:  1. Tuberculosis screening - TB Skin Test- she will have form completed/copied/scanned when she returns to have Tb skin test read.  2. Hypothyroidism, unspecified hypothyroidism type - TSH 2.762 on 8/16, continue current dose of synthroid   3. HYPERLIPIDEMIA - stable watching diet and taking fish oil supplement   Clarene Reamer, FNP-BC  [facility]  01/05/2015 12:46 PM

## 2015-01-05 NOTE — Patient Instructions (Signed)
Tuberculin purified protein derivative, PPD injection What is this medicine? TUBERCULIN PURIFIED PROTEIN DERIVATIVE, PPD is a test used to detect if you have a tuberculosis infection. It will not cause tuberculosis infection. This medicine may be used for other purposes; ask your health care provider or pharmacist if you have questions. What should I tell my health care provider before I take this medicine? They need to know if you have any of these conditions: -diabetes -HIV or AIDS -immune system problems -infection (especially a virus infection such as chickenpox, cold sores, or herpes) -kidney disease -malnutrition -an unusual or allergic reaction to tuberculin purified protein derivative, PPD, other medicines, foods, dyes, or preservatives -pregnant or trying to get pregnant -breast-feeding How should I use this medicine? This medicine is for injection in the skin. It is given by a health care professional in a hospital or clinic setting. Talk to your pediatrician regarding the use of this medicine in children. While this drug may be prescribed in children, precautions do apply. Overdosage: If you think you have taken too much of this medicine contact a poison control center or emergency room at once. NOTE: This medicine is only for you. Do not share this medicine with others. What if I miss a dose? It is important not to miss your dose. Call your doctor or health care professional if you are unable to keep an appointment. What may interact with this medicine? -adalimumab -anakinra -etanercept -infliximab -live virus vaccines -medicines to treat cancer -steroid medicines like prednisone or cortisone This list may not describe all possible interactions. Give your health care provider a list of all the medicines, herbs, non-prescription drugs, or dietary supplements you use. Also tell them if you smoke, drink alcohol, or use illegal drugs. Some items may interact with your  medicine. What should I watch for while using this medicine? See your health care provider as directed. This medicine does not protect against tuberculosis. What side effects may I notice from receiving this medicine? Side effects that you should report to your doctor or health care professional as soon as possible: -allergic reactions like skin rash, itching or hives, swelling of the face, lips, or tongue -breathing problems Side effects that usually do not require medical attention (Report these to your doctor or health care professional if they continue or are bothersome.): -bruising -pain, redness, or irritation at site where injected This list may not describe all possible side effects. Call your doctor for medical advice about side effects. You may report side effects to FDA at 1-800-FDA-1088. Where should I keep my medicine? This drug is given in a hospital or clinic and will not be stored at home. NOTE: This sheet is a summary. It may not cover all possible information. If you have questions about this medicine, talk to your doctor, pharmacist, or health care provider.    2016, Elsevier/Gold Standard. (2012-02-07 15:46:59)

## 2015-01-05 NOTE — Progress Notes (Signed)
Tuberculosis Risk Questionnaire  1. Were you born outside the [place] in one of the following parts of the world:    [place], [place], [place], [place] or [place]?  No  2. Have you traveled outside the [place] and lived for more than one month in one of the following parts of the world:  [place], [place], [place], [place] or [place]?  No  3. Do you have a compromised immune system such as from any of the following conditions:  HIV/AIDS, organ or bone marrow transplantation, diabetes, immunosuppressive   medicines (e.g. Prednisone, Remicaide), leukemia, lymphoma, cancer of the   head or neck, gastrectomy or jejunal bypass, end-stage renal disease (on   dialysis), or silicosis?  No    4. Have you ever done one of the following:    Used crack cocaine, injected illegal drugs, worked or resided in jail or prison,   worked or resided at a homeless shelter, or worked as a Dietitian in   direct contact with patients?  No  5. Have you ever been exposed to anyone with infectious tuberculosis?  NoPPD Placement note

## 2015-01-07 ENCOUNTER — Ambulatory Visit (INDEPENDENT_AMBULATORY_CARE_PROVIDER_SITE_OTHER): Payer: Medicare Other

## 2015-01-07 DIAGNOSIS — Z111 Encounter for screening for respiratory tuberculosis: Secondary | ICD-10-CM

## 2015-01-07 LAB — TB SKIN TEST: TB SKIN TEST: NEGATIVE

## 2015-01-07 NOTE — Patient Instructions (Signed)
Tuberculin Skin Test WHY AM I HAVING THIS TEST? Tuberculosis (TB) is a bacterial infection caused by Mycobacterium tuberculosis. Most people who are exposed to these bacteria have a strong enough defense (immune) system to prevent the bacteria from causing TB and developing symptoms. Their bodies prevent the germs from being active and making them sick (latent TB infection).  However, if you have TB germs in your body and your immune system is weak, you can develop a TB infection. This can cause symptoms such as:   Night sweats.  Fever.  Weakness.  Weight loss. A latent TB infection can also become active later in life if your immune system becomes weakened or compromised. You may have this test if your health care provider suspects that you have TB. You may also have this test to screen for TB if you are at risk for getting the disease. Those at increased risk include:  People who inject illegal drugs or share needles.  People with HIV or other diseases that affect immunity.  Health care workers.  People who live in high-risk communities, such as homeless shelters, nursing homes, and correctional facilities.  People who have been in contact with someone with TB.  People from countries where TB is more common. If you are in a high-risk group, your health care provider may wish to screen for TB more often. This can help prevent the spread of the disease. Sometimes TB screening is required when starting a new job, such as becoming a health care worker or a teacher. Colleges or universities may require it of new students. HOW WILL I BE TESTED? A tuberculin skin test is the main test used to check for exposure to the bacteria that can cause TB. The test checks for antibodies to the bacteria. Antibodies are proteins that your body produces to protect you from germs and other things that can make you sick. Your health care provider will inject a solution known as PPD (purified protein  derivative) under the first layer of skin on your arm. This causes a blister-like bubble to form at the site. Your health care provider will then examine the site after a number of hours have passed to see if a reaction has occurred. HOW DO I PREPARE FOR THE TEST? There is no preparation required for this test. WHAT DO THE RESULTS MEAN? Your test results will be reported as either negative or positive.  If the tuberculin skin test produces a negative result, it is likely that you do not have TB and have not been exposed to the TB bacteria. If you or your health care provider suspects exposure, however, you may want to repeat the test a few weeks later. A blood test may also be used to check for TB. This is because you will not react to the tuberculin skin test until several weeks after exposure to TB bacteria. If you test positive to the tuberculin skin test, it is likely that you have been exposed to TB bacteria. The test does not distinguish between an active and a latent TB infection. A false-positive result can occur. A false-positive result for TB bacteria is incorrect because it indicates a condition or finding is present when it is not. Talk to your health care provider to discuss your results, treatment options, and if necessary, the need for more tests. It is your responsibility to obtain your test results. Ask the lab or department performing the test when and how you will get your results. Talk with your   health care provider if you have any questions about your results.   This information is not intended to replace advice given to you by your health care provider. Make sure you discuss any questions you have with your health care provider.   Document Released: 12/01/2004 Document Revised: 03/14/2014 Document Reviewed: 06/17/2013 Elsevier Interactive Patient Education 2016 Elsevier Inc.  

## 2015-02-03 ENCOUNTER — Telehealth: Payer: Self-pay

## 2015-02-03 ENCOUNTER — Other Ambulatory Visit: Payer: Self-pay | Admitting: Family Medicine

## 2015-02-03 MED ORDER — METANX 3-90.314-2-35 MG PO CAPS: 1.0000 | ORAL_CAPSULE | Freq: Two times a day (BID) | ORAL | Status: DC

## 2015-02-03 NOTE — Telephone Encounter (Signed)
Ok I was just unfamilar with this drug so I just needed you to ok the refill because I was not sure. I can call them, let me know how many refills.

## 2015-02-03 NOTE — Telephone Encounter (Signed)
I'm unclear as to what is required to take care of this request. Can Pamala Hurry or Jonelle Sidle call [employer]?

## 2015-02-03 NOTE — Telephone Encounter (Signed)
I've sent in the refill to [facility].

## 2015-02-03 NOTE — Telephone Encounter (Signed)
Patient is calling because she needs a refill for metanx. She states [facility] wants to to contact them for this request. Fax : [phone]

## 2015-02-04 ENCOUNTER — Other Ambulatory Visit: Payer: Self-pay | Admitting: Family Medicine

## 2015-02-04 NOTE — Telephone Encounter (Signed)
The paatient wanted the Rx faxed to [facility]. The only number I have is a fax number so we have to print it out and fax to the number in the first message.

## 2015-02-05 ENCOUNTER — Other Ambulatory Visit: Payer: Self-pay | Admitting: Family Medicine

## 2015-02-05 MED ORDER — METANX 3-90.314-2-35 MG PO CAPS: 1.0000 | ORAL_CAPSULE | Freq: Two times a day (BID) | ORAL | Status: DC

## 2015-02-05 NOTE — Telephone Encounter (Signed)
I faxed a prescription to [facility] for a 90 day supply with 3 refills.

## 2015-03-08 HISTORY — PX: CATARACT EXTRACTION: SUR2

## 2015-03-24 DIAGNOSIS — D225 Melanocytic nevi of trunk: Secondary | ICD-10-CM | POA: Diagnosis not present

## 2015-03-24 DIAGNOSIS — D1801 Hemangioma of skin and subcutaneous tissue: Secondary | ICD-10-CM | POA: Diagnosis not present

## 2015-03-24 DIAGNOSIS — L821 Other seborrheic keratosis: Secondary | ICD-10-CM | POA: Diagnosis not present

## 2015-03-24 DIAGNOSIS — D2262 Melanocytic nevi of left upper limb, including shoulder: Secondary | ICD-10-CM | POA: Diagnosis not present

## 2015-03-24 DIAGNOSIS — L814 Other melanin hyperpigmentation: Secondary | ICD-10-CM | POA: Diagnosis not present

## 2015-05-03 ENCOUNTER — Ambulatory Visit (INDEPENDENT_AMBULATORY_CARE_PROVIDER_SITE_OTHER): Payer: Medicare Other | Admitting: Internal Medicine

## 2015-05-03 ENCOUNTER — Encounter: Payer: Self-pay | Admitting: Internal Medicine

## 2015-05-03 VITALS — BP 118/70 | HR 59 | Temp 97.6°F | Resp 18 | Ht 63.5 in | Wt 150.8 lb

## 2015-05-03 DIAGNOSIS — H539 Unspecified visual disturbance: Secondary | ICD-10-CM

## 2015-05-03 NOTE — Progress Notes (Signed)
Subjective:   By signing my name below, I, [name], attest that this documentation has been prepared under the direction and in the presence of Tami Lin, MD. Electronically Signed: [name], [place]. 05/03/2015 , 8:46 AM .  Patient was seen in Room 6 .   Patient ID: Jessica [name], female    DOB: [birth year], 75 y.o.   MRN: [id number] Chief Complaint  Patient presents with  . Eye Problem    saw diamond shapes in left eye for 2 min   HPI Jessica [name] is a 75 y.o. female who presents to [facility] complaining of right eye problems that occurred last night. She states that she saw diamond shapes for about 2 minutes while she was reading. She says that nothing unusual happened afterwards. She denies past experiences of this. She denies any headache, dizziness, or sleep disturbances. She denies any changes in activity or anything unusual afterwards. Her last vision exam was in August 2016. She also feels vision deteriorating when she reads.    Her father had a stroke around 49 years old.   Patient Active Problem List   Diagnosis Date Noted  . Hypothyroidism 07/24/2013  . Bradycardia on ECG 07/24/2013  . Numbness 08/07/2012  . KNEE INJURY, RIGHT 10/03/2007  . HYPERLIPIDEMIA 12/29/2006    Current outpatient prescriptions:  .  cholecalciferol (VITAMIN D) 1000 UNITS tablet, Take 1,000 Units by mouth daily., Disp: , Rfl:  .  co-enzyme Q-10 30 MG capsule, Take 30 mg by mouth 3 (three) times daily., Disp: , Rfl:  .  fish oil-omega-3 fatty acids 1000 MG capsule, Take 2 g by mouth daily., Disp: , Rfl:  .  L-Methylfolate-Algae-B12-B6 (METANX) 3-90.314-2-35 MG CAPS, Take 1 tablet by mouth 2 (two) times daily., Disp: 180 capsule, Rfl: 3 .  levothyroxine (SYNTHROID, LEVOTHROID) 112 MCG tablet, Take 1 tablet of 112 mcg strength on Tues, Thurs, Sat, and Sun. Alternate with 125 mcg tablets on other days of week., Disp: 60 tablet, Rfl: 5 .  levothyroxine (SYNTHROID, LEVOTHROID) 125  MCG tablet, Take 1 tablet of 125 mcg strength on Mon, Wed and Fri. Alternate days with 112 mcg dose on other days of week., Disp: 40 tablet, Rfl: 5 .  Misc Natural Products (OSTEO BI-FLEX JOINT SHIELD PO), Take 2 tablets by mouth as directed. , Disp: , Rfl:  .  Red Yeast Rice 600 MG TABS, Take 1 tablet by mouth daily. , Disp: , Rfl:   Review of Systems  Constitutional: Negative for fever, chills, activity change and fatigue.  HENT: Negative for hearing loss, trouble swallowing and voice change.   Eyes: Positive for visual disturbance. Negative for photophobia, pain, discharge, redness and itching.  Respiratory: Negative for cough and shortness of breath.   Cardiovascular: Negative for chest pain and palpitations.  Gastrointestinal: Negative for nausea, vomiting and abdominal pain.  Neurological: Negative for dizziness, speech difficulty, weakness, light-headedness, numbness and headaches.  Hematological: Does not bruise/bleed easily.  Psychiatric/Behavioral: Negative for sleep disturbance and dysphoric mood.      Objective:   Physical Exam  Constitutional: She appears well-developed and well-nourished. No distress.  HENT:  Head: Normocephalic and atraumatic.  Mouth/Throat: Oropharynx is clear and moist.  Eyes: EOM are normal. Pupils are equal, round, and reactive to light.  Neck: No thyromegaly present.  Cardiovascular: Normal rate, regular rhythm, normal heart sounds and intact distal pulses.   No murmur heard. Pulmonary/Chest: Effort normal and breath sounds normal.  Musculoskeletal: She exhibits no edema.  Lymphadenopathy:  She has no cervical adenopathy.  Psychiatric: She has a normal mood and affect. Judgment and thought content normal.  Neurological: MENTAL STATUS EXAM:  Orientation: Alert and oriented to person, place and time.  Memory: Cooperative, follows commands. Recent and remote memory normal.  Attention, concentration: Attention span and concentration are normal.    Language: Speech is clear and language is normal.  Fund of knowledge: Aware of current events, vocabulary appropriate for patient age.  CRANIAL NERVES:  CN 2 (Optic): Visual fields intact to confrontation, funduscopic examination difficult with cloudy cornea-?cataracts but without optic disk pallor of asymmetrical red flare.  CN 3,4,6 (EOM): Pupils equal and reactive to light. Full extraocular eye movement without nystagmus.  CN 5 (Trigeminal): Facial sensation is normal, no weakness of masticatory muscles.  CN 7 (Facial): No facial weakness or asymmetry.  CN 8 (Auditory): Auditory acuity grossly normal.  CN 9,10 (Glossophar): The uvula is midline, the palate elevates symmetrically.  CN 11 (spinal access): Normal sternocleidomastoid and trapezius strength.  CN 12 (Hypoglossal): The tongue is midline. No atrophy or fasciculations.Marland Kitchen  MOTOR:  Muscle Strength: Strength - strength 5/5 RUE and RLE, 5/5 LUE and LLE 5/5,no pronation or drift.  Muscle Tone: Tone and muscle bulk are normal in the upper and lower extremities.  REFLEXES: DTRs - 2+ and symmetrical in all four extremities, plantar responses are flexor bilaterally.  COORDINATION: Intact finger-to-nose, heel-to-shin, and rapid alternating movements, no tremor.  SENSATION:  Intact to light touch  Negative Romberg test.  GAIT: Routine and tandem gait are normal.   BP 118/70 mmHg  Pulse 59  Temp(Src) 97.6 F (36.4 C) (Oral)  Resp 18  Ht 5' 3.5" (1.613 m)  Wt 150 lb 12.8 oz (68.402 kg)  BMI 26.29 kg/m2  SpO2 96%     Assessment & Plan:  Visual disturbance reassured/no evidence for CVA-not likely TIA-suspect visual migraine--she will see her opthal for full dilated retinal exam next

## 2015-06-16 ENCOUNTER — Other Ambulatory Visit: Payer: Self-pay | Admitting: Family Medicine

## 2015-06-17 NOTE — Telephone Encounter (Signed)
Jessica [name], it looks like Dr Leward Quan used to fill this for pt periodically for dental procedures. Since you are last provider to see pt for CPE I will send to you to review request.

## 2015-07-06 DIAGNOSIS — H2513 Age-related nuclear cataract, bilateral: Secondary | ICD-10-CM | POA: Diagnosis not present

## 2015-07-06 DIAGNOSIS — Z0100 Encounter for examination of eyes and vision without abnormal findings: Secondary | ICD-10-CM | POA: Diagnosis not present

## 2015-07-21 DIAGNOSIS — H2512 Age-related nuclear cataract, left eye: Secondary | ICD-10-CM | POA: Diagnosis not present

## 2015-07-21 DIAGNOSIS — H25812 Combined forms of age-related cataract, left eye: Secondary | ICD-10-CM | POA: Diagnosis not present

## 2015-08-25 DIAGNOSIS — H2511 Age-related nuclear cataract, right eye: Secondary | ICD-10-CM | POA: Diagnosis not present

## 2015-08-25 DIAGNOSIS — H25811 Combined forms of age-related cataract, right eye: Secondary | ICD-10-CM | POA: Diagnosis not present

## 2015-11-02 ENCOUNTER — Ambulatory Visit (INDEPENDENT_AMBULATORY_CARE_PROVIDER_SITE_OTHER): Payer: Medicare Other | Admitting: Family Medicine

## 2015-11-02 VITALS — BP 118/80 | HR 63 | Temp 98.1°F | Resp 18 | Ht 63.0 in | Wt 154.8 lb

## 2015-11-02 DIAGNOSIS — B3731 Acute candidiasis of vulva and vagina: Secondary | ICD-10-CM

## 2015-11-02 DIAGNOSIS — B373 Candidiasis of vulva and vagina: Secondary | ICD-10-CM

## 2015-11-02 LAB — POCT WET + KOH PREP
TRICH BY WET PREP: ABSENT
Yeast by KOH: ABSENT
Yeast by wet prep: ABSENT

## 2015-11-02 MED ORDER — FLUCONAZOLE 150 MG PO TABS: 150.0000 mg | ORAL_TABLET | Freq: Once | ORAL | 0 refills | Status: DC

## 2015-11-02 MED ORDER — FLUCONAZOLE 150 MG PO TABS: 150.0000 mg | ORAL_TABLET | Freq: Once | ORAL | 0 refills | Status: AC

## 2015-11-02 NOTE — Patient Instructions (Addendum)
IF you received an x-ray today, you will receive an invoice from [facility]. Please contact [facility] at [phone] with questions or concerns regarding your invoice.   IF you received labwork today, you will receive an invoice from [employer]. Please contact [facility] at [phone] with questions or concerns regarding your invoice.   Our billing staff will not be able to assist you with questions regarding bills from these companies.  You will be contacted with the lab results as soon as they are available. The fastest way to get your results is to activate your My Chart account. Instructions are located on the last page of this paperwork. If you have not heard from [place] regarding the results in 2 weeks, please contact this office.      Monilial Vaginitis Vaginitis in a soreness, swelling and redness (inflammation) of the vagina and vulva. Monilial vaginitis is not a sexually transmitted infection. CAUSES  Yeast vaginitis is caused by yeast (candida) that is normally found in your vagina. With a yeast infection, the candida has overgrown in number to a point that upsets the chemical balance. SYMPTOMS   White, thick vaginal discharge.  Swelling, itching, redness and irritation of the vagina and possibly the lips of the vagina (vulva).  Burning or painful urination.  Painful intercourse. DIAGNOSIS  Things that may contribute to monilial vaginitis are:  Postmenopausal and virginal states.  Pregnancy.  Infections.  Being tired, sick or stressed, especially if you had monilial vaginitis in the past.  Diabetes. Good control will help lower the chance.  Birth control pills.  Tight fitting garments.  Using bubble bath, feminine sprays, douches or deodorant tampons.  Taking certain medications that kill germs (antibiotics).  Sporadic recurrence can occur if you become ill. TREATMENT  Your caregiver will give you  medication.  There are several kinds of anti monilial vaginal creams and suppositories specific for monilial vaginitis. For recurrent yeast infections, use a suppository or cream in the vagina 2 times a week, or as directed.  Anti-monilial or steroid cream for the itching or irritation of the vulva may also be used. Get your caregiver's permission.  Painting the vagina with methylene blue solution may help if the monilial cream does not work.  Eating yogurt may help prevent monilial vaginitis. HOME CARE INSTRUCTIONS   Finish all medication as prescribed.  Do not have sex until treatment is completed or after your caregiver tells you it is okay.  Take warm sitz baths.  Do not douche.  Do not use tampons, especially scented ones.  Wear cotton underwear.  Avoid tight pants and panty hose.  Tell your sexual partner that you have a yeast infection. They should go to their caregiver if they have symptoms such as mild rash or itching.  Your sexual partner should be treated as well if your infection is difficult to eliminate.  Practice safer sex. Use condoms.  Some vaginal medications cause latex condoms to fail. Vaginal medications that harm condoms are:  Cleocin cream.  Butoconazole (Femstat).  Terconazole (Terazol) vaginal suppository.  Miconazole (Monistat) (may be purchased over the counter). SEEK MEDICAL CARE IF:   You have a temperature by mouth above 102 F (38.9 C).  The infection is getting worse after 2 days of treatment.  The infection is not getting better after 3 days of treatment.  You develop blisters in or around your vagina.  You develop vaginal bleeding, and it is not your menstrual period.  You have  pain when you urinate.  You develop intestinal problems.  You have pain with sexual intercourse.   This information is not intended to replace advice given to you by your health care provider. Make sure you discuss any questions you have with your  health care provider.   Document Released: 12/01/2004 Document Revised: 05/16/2011 Document Reviewed: 08/25/2014 Elsevier Interactive Patient Education [employer].

## 2015-11-06 ENCOUNTER — Telehealth: Payer: Self-pay

## 2015-11-06 NOTE — Telephone Encounter (Signed)
Pt would like to know if she can take her fluconazole (DIFLUCAN) 150 MG tablet [id number]  every 2 days rather than 3 days as the symptoms return. The symptoms are itching and burning. Please advise at [phone]

## 2015-11-06 NOTE — Progress Notes (Signed)
By signing my name below, I, Mesha Guinyard, attest that this documentation has been prepared under the direction and in the presence of Delman Cheadle.  Electronically Signed: Verlee Monte, Medical Scribe. 11/06/15. 5:32 PM.  Subjective:    Patient ID: Jessica [name], female    DOB: 1940-04-20, 75 y.o.   MRN: [id number]  Vaginal Itching  The patient's primary symptoms include vaginal discharge. Pertinent negatives include no constipation, diarrhea, dysuria, frequency, hematuria or urgency.   Chief Complaint  Patient presents with  . Vaginal Itching    x 1 month    HPI Comments: Jessica [name] is a 75 y.o. female who presents to the [facility] complaining of vaginal itching onset 1 month. Pt reports associated symptoms of a white discharge, and pain where the itching is. Pt used a old jar of powdered vagisil and it gave her relief for a day. Pt lives in a place that serves dessert all the time and she eventually eliminated it from her diet. Pt denies urinary frequency, urinary urgency, dysuria, hematuria, and irregular bm.  Right Shoulder Pain: Pt reports right shoulder pain in the morning that radiates/shoots to her hand and it is relieved through the day. Pt is right handed. Pt denies neck pain, or shoulder weakness.  Skin Tag: Pt would like to get a skin tag removed.   Vitamins: Pt is concerned about the effectiveness of OTC vitamins such as fish oils, and red yeast rice.  Patient Active Problem List   Diagnosis Date Noted  . Hypothyroidism 07/24/2013  . Bradycardia on ECG 07/24/2013  . Numbness 08/07/2012  . KNEE INJURY, RIGHT 10/03/2007  . HYPERLIPIDEMIA 12/29/2006   Past Medical History:  Diagnosis Date  . Cataract   . High cholesterol   . Osteopenia   . Pericarditis 01/1999  . Pneumothorax 01/1999  . Thyroid disorder   . Vitamin B 12 deficiency    Past Surgical History:  Procedure Laterality Date  . birthmark      removed,left leg  .  [place]?   Allergies  Allergen Reactions  . Atorvastatin     Muscle weakness  . Rosuvastatin     Muscle weaknes   Prior to Admission medications   Medication Sig Start Date End Date Taking? Authorizing Provider  cholecalciferol (VITAMIN D) 1000 UNITS tablet Take 1,000 Units by mouth daily.   Yes Historical Provider, MD  co-enzyme Q-10 30 MG capsule Take 30 mg by mouth 3 (three) times daily.   Yes Historical Provider, MD  fish oil-omega-3 fatty acids 1000 MG capsule Take 2 g by mouth daily.   Yes Historical Provider, MD  L-Methylfolate-Algae-B12-B6 Glade Stanford) 3-90.314-2-35 MG CAPS Take 1 tablet by mouth 2 (two) times daily. 02/05/15  Yes Elby Beck, FNP  levothyroxine (SYNTHROID, LEVOTHROID) 112 MCG tablet Take 1 tablet of 112 mcg strength on Tues, Thurs, Sat, and Sun. Alternate with 125 mcg tablets on other days of week. 10/08/14  Yes Wendie Agreste, MD  levothyroxine (SYNTHROID, LEVOTHROID) 125 MCG tablet Take 1 tablet of 125 mcg strength on Mon, Wed and Fri. Alternate days with 112 mcg dose on other days of week. 10/08/14  Yes Wendie Agreste, MD  Misc Natural Products (OSTEO BI-FLEX JOINT SHIELD PO) Take 2 tablets by mouth as directed.    Yes Historical Provider, MD  Red Yeast Rice 600 MG TABS Take 1 tablet by mouth daily.    Yes Historical Provider, MD  amoxicillin (AMOXIL) 500  MG capsule TAKE 4 CAPSULES ONE HOUR PRIOR TO DENTAL PROCEDURE. Patient not taking: Reported on 11/02/2015 06/17/15   Elby Beck, FNP   Social History   Social History  . Marital status: Divorced    Spouse name: N/A  . Number of children: 1  . Years of education: college   Occupational History  . retired    Social History Main Topics  . Smoking status: Never Smoker  . Smokeless tobacco: Never Used  . Alcohol use 1.8 oz/week    3 Glasses of wine per week  . Drug use: No  . Sexual activity: Not on file   Other Topics Concern  . Not on file   Social History  Narrative   She lives alone, retired, has one child.  No smoke, drink occasionally   Education- college three masters.   Right handed.   Caffeine- Coffee and tea daily.   Depression screen [facility] 2/9 11/02/2015 01/05/2015 11/12/2014 10/08/2014 08/19/2014  Decreased Interest 0 0 0 0 0  Down, Depressed, Hopeless 0 0 0 0 0  PHQ - 2 Score 0 0 0 0 0   Review of Systems  Gastrointestinal: Negative for constipation and diarrhea.  Genitourinary: Positive for vaginal discharge and vaginal pain. Negative for dysuria, frequency, hematuria and urgency.  Musculoskeletal: Positive for arthralgias. Negative for neck pain.  Neurological: Negative for weakness.   Objective:  Physical Exam  Constitutional: She appears well-developed and well-nourished. No distress.  HENT:  Head: Normocephalic and atraumatic.  Eyes: Conjunctivae are normal.  Neck: Neck supple.  Cardiovascular: Normal rate.   Pulmonary/Chest: Effort normal.  Genitourinary: Vaginal discharge found.  Genitourinary Comments: Labia majora and perennial nl Inner labia minor with thinned dry mucus, irribilitiy, and friability Small amount of think white discharge in the introitus Vaginal vault nl without think white discharge  Neurological: She is alert.  Skin: Skin is warm and dry.  Psychiatric: She has a normal mood and affect. Her behavior is normal.  Nursing note and vitals reviewed.  BP 118/80 (BP Location: Right Arm, Patient Position: Sitting, Cuff Size: Normal)   Pulse 63   Temp 98.1 F (36.7 C)   Resp 18   Ht 5\' 3"  (1.6 m)   Wt 154 lb 12.8 oz (70.2 kg)   SpO2 95%   BMI 27.42 kg/m    Results for orders placed or performed in visit on 11/02/15  POCT Wet + KOH Prep  Result Value Ref Range   Yeast by KOH Absent Present, Absent   Yeast by wet prep Absent Present, Absent   WBC by wet prep None None, Few, Too numerous to count   Clue Cells Wet Prep HPF POC None None, Too numerous to count   Trich by wet prep Absent Present, Absent    Bacteria Wet Prep HPF POC Few None, Few, Too numerous to count   Epithelial Cells By [employer] Pref ([facility]) Few None, Few, Too numerous to count   RBC,UR,HPF,POC Many (A) None RBC/hpf    Assessment & Plan:   1. Vaginal yeast infection     Orders Placed This Encounter  Procedures  . POCT Wet + KOH Prep    Meds ordered this encounter  Medications  . DISCONTD: fluconazole (DIFLUCAN) 150 MG tablet    Sig: Take 1 tablet (150 mg total) by mouth once. Repeat if needed every 3 days until symptoms relieved    Dispense:  4 tablet    Refill:  0  . fluconazole (DIFLUCAN) 150 MG tablet  Sig: Take 1 tablet (150 mg total) by mouth once. Repeat if needed every 3 days until symptoms relieved    Dispense:  4 tablet    Refill:  0    I personally performed the services described in this documentation, which was scribed in my presence. The recorded information has been reviewed and considered, and addended by me as needed.   Delman Cheadle, M.D.  [facility] [place] [address] [place], [place] [address] [phone] phone [phone] fax  11/06/15 10:18 PM

## 2015-11-06 NOTE — Telephone Encounter (Signed)
yes

## 2015-11-06 NOTE — Telephone Encounter (Signed)
Pt informed she can take Diflucan every 2 days per Dr. Brigitte Pulse.

## 2015-11-10 ENCOUNTER — Telehealth: Payer: Self-pay

## 2015-11-10 MED ORDER — CLOTRIMAZOLE-BETAMETHASONE 1-0.05 % EX CREA: 1.0000 "application " | TOPICAL_CREAM | Freq: Two times a day (BID) | CUTANEOUS | 0 refills | Status: DC

## 2015-11-10 NOTE — Telephone Encounter (Signed)
Patient is calling because the fluconazole isn't working and she's still having burning, itching, and inflammation. Please advise! [phone]

## 2015-11-10 NOTE — Telephone Encounter (Signed)
Try lotrisone cream twice a day which has been sent to the pharmacy.  Try to keep the area as dry as possible - use a hair dryer or fan after a shower to thoroughly dry.  Make sure breathable cotton underwear is watched in a hypoallergenic detergent without color or perfume additives and is she is needing to use pads then try to find a brand that is also bleach and fragrance free.

## 2015-11-11 ENCOUNTER — Telehealth: Payer: Self-pay | Admitting: Emergency Medicine

## 2015-11-11 NOTE — Telephone Encounter (Signed)
Spoke with patient advised per Dr. Raul Del message. Patient understood

## 2015-11-11 NOTE — Telephone Encounter (Signed)
LM patient to pick up cream and call back with instruction from Dr. Brigitte Pulse

## 2015-11-11 NOTE — Telephone Encounter (Signed)
Pt given blood results and scheduled CT scan appointment today @ 1pm. Pt informed NPO x4 hours prior to testing.    instructed to go directly to [facility] to start drinking contrast.

## 2015-11-16 ENCOUNTER — Telehealth: Payer: Self-pay

## 2015-11-16 ENCOUNTER — Encounter: Payer: Self-pay | Admitting: Family Medicine

## 2015-11-16 NOTE — Telephone Encounter (Signed)
Pt needs advice,not progressing as she feels she should    Best phone for pt is [phone]

## 2015-11-17 ENCOUNTER — Other Ambulatory Visit: Payer: Self-pay | Admitting: Family Medicine

## 2015-11-17 ENCOUNTER — Telehealth: Payer: Self-pay | Admitting: Emergency Medicine

## 2015-11-17 ENCOUNTER — Telehealth: Payer: Self-pay

## 2015-11-17 NOTE — Telephone Encounter (Signed)
Fine to do course of amox.  I have already called her in a different top treatment when she called in stating that she did not respond to the oral so def not safe to call in a 3rd thing - clearly the diagnosis must have been wrong so def needs to recheck if still having sxs.

## 2015-11-17 NOTE — Telephone Encounter (Signed)
Dr Brigitte Pulse, Pt is still c/o vaginal swelling, inflammation and itching after taking 1 week Diflucan and using Lotrisone cream States, today sx's has improved with applying topical ointment at the labia area but still uncomfortable. Should she schedule a follow up appointment or do you want to prescribed different medication? Please f/u She is going to the dentist at 2pm today and will start on Amoxicillin.

## 2015-11-17 NOTE — Telephone Encounter (Signed)
Patient stated she is going to the dentist at 3. Patient is instructed to take Amoxicillin at 2. Patient is working on getting rid of another infection. She want to know if it's a good idea to take the amoxicillin. Please call patient ASAP. [phone]. OR [phone]

## 2015-11-17 NOTE — Telephone Encounter (Signed)
I think there was a misunderstanding .  I have NOT called her in a new treatment.  This was last wk that we changed her treatment plan by phone message so since neither the initial oral treatment nor the cream that was called in last week has been working, she really needs to come in for recheck since clearly my diagnosis was apparently not correct.

## 2015-11-17 NOTE — Telephone Encounter (Signed)
Pt notified to stop taking previous cream and start taking new medication sent to [facility] pharmacy. Also instructed to return to the clinic if cream does not work

## 2015-11-18 ENCOUNTER — Ambulatory Visit (INDEPENDENT_AMBULATORY_CARE_PROVIDER_SITE_OTHER): Payer: Medicare Other | Admitting: Family Medicine

## 2015-11-18 ENCOUNTER — Encounter: Payer: Self-pay | Admitting: Family Medicine

## 2015-11-18 VITALS — BP 118/82 | HR 73 | Temp 98.0°F | Resp 17 | Ht 63.0 in | Wt 154.0 lb

## 2015-11-18 DIAGNOSIS — R21 Rash and other nonspecific skin eruption: Secondary | ICD-10-CM | POA: Diagnosis not present

## 2015-11-18 DIAGNOSIS — L918 Other hypertrophic disorders of the skin: Secondary | ICD-10-CM | POA: Diagnosis not present

## 2015-11-18 DIAGNOSIS — L089 Local infection of the skin and subcutaneous tissue, unspecified: Secondary | ICD-10-CM

## 2015-11-18 DIAGNOSIS — N761 Subacute and chronic vaginitis: Secondary | ICD-10-CM | POA: Diagnosis not present

## 2015-11-18 DIAGNOSIS — Z23 Encounter for immunization: Secondary | ICD-10-CM | POA: Diagnosis not present

## 2015-11-18 DIAGNOSIS — L820 Inflamed seborrheic keratosis: Secondary | ICD-10-CM | POA: Diagnosis not present

## 2015-11-18 NOTE — Progress Notes (Addendum)
By signing my name below I, Tereasa Coop, attest that this documentation has been prepared under the direction and in the presence of Delman Cheadle, MD. Electonically Signed. Tereasa Coop, Scribe 11/18/2015 at 5:23 PM  Subjective:    Patient ID: Jessica [name], female    DOB: May 27, 1940, 75 y.o.   MRN: [id number]  Chief Complaint  Patient presents with  . Follow-up    Vaginal issues    HPI Jessica [name] is a 75 y.o. female who presents to the [facility] for follow up reevaluation of vaginal discharge. Pt initially seen and evaluated by Dr Brigitte Pulse at [facility] on 11/06/15. At that time pt was c/o vaginal pain and itching for a month with white discharge. At prior visit pt denied any urinary frequency/urgency or dysuria. Pt dx with vaginal yeast infection and started on diflucan 150mg  and instructed to take 1 every 3 days. After 3 days pt called saying itching and burning was continuing and requested to take medication every other day, which was fine. After a week, pt called and reported burning and itching continued, so pt was switched to lotrisone cream. Hypoallergenic precautions discussed. 2 days ago, pt emailed in and stated that symptoms returned though somewhat improved. Pt had to take a course of amoxicillin prior to dental work yesterday.   Today Pt reports that the diflucan improved her symptoms, but did not resolve it completely. Pt also states today that the lotrisone cream helped relieve symptoms, but did not resolve them. Pt states that she was unsure if she had been applying enough cream at a time. Pt has switched to hypoallergenic  Soaps, body washes, and shampoos. Pt has not changed her detergent. Pt has been using the same detergent for many years with no complications.   Pt still report a very mild sharp stabbing vaginal pain. Pt denies any N/V/D, dysuria, fevers, chill, urinary frequency/urgency, or vaginal swelling. Pt states that the color of her urine has become  a little lighter now that she is eating less sugar.   Pt reports that she has 2 skin tags on her neck that she picks at frequently, which causes irritation and bleeding.   Pt requests flu and pneumonia vaccinations.   Patient Active Problem List   Diagnosis Date Noted  . Hypothyroidism 07/24/2013  . Bradycardia on ECG 07/24/2013  . Numbness 08/07/2012  . KNEE INJURY, RIGHT 10/03/2007  . HYPERLIPIDEMIA 12/29/2006    Current Outpatient Prescriptions on File Prior to Visit  Medication Sig Dispense Refill  . amoxicillin (AMOXIL) 500 MG capsule TAKE 4 CAPSULES ONE HOUR PRIOR TO DENTAL PROCEDURE. 8 capsule 0  . cholecalciferol (VITAMIN D) 1000 UNITS tablet Take 1,000 Units by mouth daily.    . clotrimazole-betamethasone (LOTRISONE) cream Apply 1 application topically 2 (two) times daily. 45 g 0  . co-enzyme Q-10 30 MG capsule Take 30 mg by mouth 3 (three) times daily.    . fish oil-omega-3 fatty acids 1000 MG capsule Take 2 g by mouth daily.    Marland Kitchen L-Methylfolate-Algae-B12-B6 (METANX) 3-90.314-2-35 MG CAPS Take 1 tablet by mouth 2 (two) times daily. 180 capsule 3  . levothyroxine (SYNTHROID, LEVOTHROID) 112 MCG tablet Take 1 tablet of 112 mcg strength on Tues, Thurs, Sat, and Sun. Alternate with 125 mcg tablets on other days of week. 60 tablet 5  . levothyroxine (SYNTHROID, LEVOTHROID) 125 MCG tablet Take 1 tablet of 125 mcg strength on Mon, Wed and Fri. Alternate days with 112 mcg dose on  other days of week. 40 tablet 5  . Misc Natural Products (OSTEO BI-FLEX JOINT SHIELD PO) Take 2 tablets by mouth as directed.     . Red Yeast Rice 600 MG TABS Take 1 tablet by mouth daily.      No current facility-administered medications on file prior to visit.     Allergies  Allergen Reactions  . Atorvastatin     Muscle weakness  . Rosuvastatin     Muscle weaknes    Depression screen [facility] 2/9 11/18/2015 11/02/2015 01/05/2015 11/12/2014 10/08/2014  Decreased Interest 0 0 0 0 0  Down, Depressed, Hopeless  0 0 0 0 0  PHQ - 2 Score 0 0 0 0 0       Review of Systems  Constitutional: Negative for chills and fever.  HENT: Negative for congestion and rhinorrhea.   Eyes: Negative for visual disturbance.  Respiratory: Negative for cough, chest tightness and shortness of breath.   Cardiovascular: Negative for chest pain, palpitations and leg swelling.  Gastrointestinal: Negative for abdominal pain, diarrhea, nausea and vomiting.  Genitourinary: Positive for vaginal discharge. Negative for dysuria, frequency, hematuria and urgency.  Musculoskeletal: Negative for back pain.  Skin:       Pt is positive for 2 skin tags on her neck.  Neurological: Negative for dizziness and headaches.  Psychiatric/Behavioral: Negative for agitation.       Objective:   Physical Exam  Constitutional: She is oriented to person, place, and time. She appears well-developed and well-nourished. No distress.  HENT:  Head: Normocephalic and atraumatic.  Eyes: Conjunctivae are normal. Pupils are equal, round, and reactive to light.  Neck: Neck supple.  Cardiovascular: Normal rate.   Pulmonary/Chest: Effort normal.  Musculoskeletal: Normal range of motion.  Neurological: She is alert and oriented to person, place, and time.  Skin: Skin is warm and dry.  Pt has 2 3-53mm skin colored papules on her anterior lower neck, one on each side. There is mild erythema at base of skin tags with scabbing.  Psychiatric: She has a normal mood and affect. Her behavior is normal.  Nursing note and vitals reviewed.   BP 118/82 (BP Location: Left Arm, Patient Position: Sitting, Cuff Size: Large)   Pulse 73   Temp 98 F (36.7 C) (Oral)   Resp 17   Ht 5\' 3"  (1.6 m)   Wt 154 lb (69.9 kg)   SpO2 94%   BMI 27.28 kg/m   Risks benefits reviewed and informed consent obtained. Cleaned with betadine x 2, anesthesia with 2% lidocaine w/ epi. 58mm punch obtained and sent for path. Closure with single suture. Pt tolerated procedure well. No  EBL. Wound care reviewed in detail.     Assessment & Plan:   1. 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  Hold off on further treatment until biopsy returns. Recheck in 5-7d for suture removal and discuss biopsy results.  2. Inflamed skin tag - removed on neck x 2  3. Need for prophylactic vaccination and inoculation against influenza   4. Need for prophylactic vaccination against Streptococcus pneumoniae (pneumococcus)     Orders Placed This Encounter  Procedures  . Pneumococcal polysaccharide vaccine 23-valent greater than or equal to 2yo subcutaneous/IM  . Flu Vaccine QUAD 36+ mos IM    I personally performed the services described in this documentation, which was scribed in my presence.  The recorded information has been reviewed and considered, and addended by me as needed.   Delman Cheadle, M.D.  [facility] [place] [address] [place], [place] [address] [phone] phone [phone] fax  11/23/15 8:24 AM

## 2015-11-18 NOTE — Telephone Encounter (Signed)
Patient is scheduled to come in for a recheck with Dr. Brigitte Pulse.

## 2015-11-18 NOTE — Patient Instructions (Addendum)
Cleanse with with soap and water once or twice a day. Can use triple antibiotic ointment or call for prescription topical antibiotic.  After a week, you can switch to mederma or vitamin E or vaseline.     IF you received an x-ray today, you will receive an invoice from [facility]. Please contact [facility] at [phone] with questions or concerns regarding your invoice.   IF you received labwork today, you will receive an invoice from [employer]. Please contact [facility] at [phone] with questions or concerns regarding your invoice.   Our billing staff will not be able to assist you with questions regarding bills from these companies.  You will be contacted with the lab results as soon as they are available. The fastest way to get your results is to activate your My Chart account. Instructions are located on the last page of this paperwork. If you have not heard from [place] regarding the results in 2 weeks, please contact this office.    We recommend that you schedule a mammogram for breast cancer screening. Typically, you do not need a referral to do this. Please contact a local imaging center to schedule your mammogram.  [facility] - [phone]  *ask for the Radiology Department [facility] - [phone] or [phone]  [facility] - [phone] [place] [phone] [facility] - [phone]  *ask for the [facility] - [phone]  *ask for the Radiology Department [facility] - [phone]  *ask for the Mammography Department [facility] - [phone]  Vulva Biopsy, Care After Refer to this sheet in the next few weeks. These instructions provide you with information on caring for yourself after your procedure. Your caregiver may also give you more specific instructions. Your treatment has  been planned according to current medical practices, but problems sometimes occur. Call your caregiver if you have any problems or questions after your procedure.  HOME CARE INSTRUCTIONS  Only take over-the-counter or prescription medicines for pain or discomfort as directed by your caregiver.  Do not rub the biopsy area after urinating. Gently pat the area dry or use a bottle filled with warm water (peri-bottle) to clean the area. Gently wipe from front to back.  Clean the area using water and mild soap. Gently pat the area dry.  Check your biopsy site with a handheld mirror daily to be sure it is healing properly.  Do not have intercourse for 3-4 days or as directed by your caregiver.  Avoid exercise, such as running or biking, for 2-3 days or as directed by your caregiver.  You may shower after the procedure. Avoid bathing and swimming until the sutures dissolve and healing is complete.  Wear loose cotton underwear. Do not wear tight pants.  Keep all follow-up appointments with your caregiver.  Contact your caregiver to obtain your test results. SEEK IMMEDIATE MEDICAL CARE IF:  Your pain increases and medicine does not help it.  Your vaginal area becomes swollen or red.  You have fluid or an abnormally bad smell coming from your vagina.  You have a fever. MAKE SURE YOU:  Understand these instructions.  Will watch your condition.  Will get help right away if you are not doing well or get worse.   This information is not intended to replace advice given to you by your health care provider. Make sure you discuss any questions you  have with your health care provider.   Document Released: 02/08/2012 Document Revised: 03/14/2014 Document Reviewed: 02/08/2012 Elsevier Interactive Patient Education 2016 Elsevier Inc.   Lichen Planus Lichen planus is a skin problem that causes redness, itching, small bumps, and sores. It can affect the skin in any area of the body. Some  common areas affected include:  Arms, wrists, legs, or ankles.  Chest, back, or abdomen.  Genital areas such as the vulva and vagina.  Gums and inside of the mouth.  Scalp.  Fingernails or toenails. Treatment can help control the symptoms of this condition. The condition can last for a long time. It can take 6-18 months for it to go away. CAUSES The exact cause of this condition is not known. The condition is not passed from one person to another (not contagious). It may be related to an allergy or an autoimmune response. An autoimmune response occurs when the body's defense system (immune system) mistakenly attacks healthy tissues. RISK FACTORS This condition is more likely to develop in:  People who are older than 74 years of age.  People who take certain medicines.  People who have been exposed to certain dyes or chemicals.  People with hepatitis C. SYMPTOMS Symptoms of this condition may include:  Itching, which can be severe.  Small reddish or purple bumps on the skin. These may have flat tops and may be round or irregular shaped.  Redness or white patches on the gums or tongue.  Redness, soreness, or a burning feeling in the genital area. This may lead to pain or bleeding during sex.  Changes in the fingernails or toenails. The nails may become thin or rough. They may have ridges in them.  Redness or irritation of the eyes. This is rare. DIAGNOSIS This condition may be diagnosed based on:   A physical exam. The health care provider will examine your affected skin and check for changes inside your mouth.  Removal of a tissue sample (biopsy sample) to be looked at under a microscope. TREATMENT Treatment for this condition may depend on the severity of symptoms. In some cases, no treatment is needed. If treatment is needed to control symptoms, it may include:  Creams or ointments (topical steroids) to help control itching and irritation.  Medicine to be taken by  mouth.  A treatment in which your skin is exposed to ultraviolet light (phototherapy).  Lozenges that you suck on to help treat sores in the mouth. HOME CARE INSTRUCTIONS  Take over-the-counter and prescription medicines only as told by your health care provider.  Use creams or ointments as told by your health care provider.  Do not scratch the affected areas of skin.  Women should keep the vaginal area as clean and dry as possible. SEEK MEDICAL CARE IF:  You have increasing redness, swelling, or pain in the affected area.  You have fluid, blood, or pus coming from the affected area.  Your eyes become irritated.   This information is not intended to replace advice given to you by your health care provider. Make sure you discuss any questions you have with your health care provider.   Document Released: 07/15/2010 Document Revised: 11/12/2014 Document Reviewed: 05/19/2014 Elsevier Interactive Patient Education [employer].

## 2015-11-19 ENCOUNTER — Telehealth: Payer: Self-pay

## 2015-11-19 DIAGNOSIS — E039 Hypothyroidism, unspecified: Secondary | ICD-10-CM

## 2015-11-19 MED ORDER — LEVOTHYROXINE SODIUM 112 MCG PO TABS
ORAL_TABLET | ORAL | 0 refills | Status: DC
Start: 2015-11-19 — End: 2015-11-30

## 2015-11-19 MED ORDER — LEVOTHYROXINE SODIUM 125 MCG PO TABS: ORAL_TABLET | ORAL | 0 refills | Status: DC

## 2015-11-19 NOTE — Telephone Encounter (Signed)
Received faxed req from [place] for levothyroxine. Pt has appt sch in 4 days and is due for TSH. I denied RF of current dose to make sure the dose is correct after lab is done. Called pt and LMOM advising this and asked for CB if she is out and needs some from local pharm.

## 2015-11-19 NOTE — Addendum Note (Signed)
Addended by: Elwyn Reach A on: 11/19/2015 11:49 AM   Modules accepted: Orders

## 2015-11-23 ENCOUNTER — Ambulatory Visit (INDEPENDENT_AMBULATORY_CARE_PROVIDER_SITE_OTHER): Payer: Medicare Other | Admitting: Family Medicine

## 2015-11-23 VITALS — BP 110/60 | HR 68 | Temp 97.5°F | Resp 16 | Ht 64.5 in | Wt 152.8 lb

## 2015-11-23 DIAGNOSIS — E559 Vitamin D deficiency, unspecified: Secondary | ICD-10-CM | POA: Diagnosis not present

## 2015-11-23 DIAGNOSIS — J069 Acute upper respiratory infection, unspecified: Secondary | ICD-10-CM | POA: Diagnosis not present

## 2015-11-23 DIAGNOSIS — E039 Hypothyroidism, unspecified: Secondary | ICD-10-CM

## 2015-11-23 DIAGNOSIS — E538 Deficiency of other specified B group vitamins: Secondary | ICD-10-CM

## 2015-11-23 DIAGNOSIS — R21 Rash and other nonspecific skin eruption: Secondary | ICD-10-CM | POA: Diagnosis not present

## 2015-11-23 DIAGNOSIS — D649 Anemia, unspecified: Secondary | ICD-10-CM

## 2015-11-23 DIAGNOSIS — L259 Unspecified contact dermatitis, unspecified cause: Secondary | ICD-10-CM

## 2015-11-23 DIAGNOSIS — Z780 Asymptomatic menopausal state: Secondary | ICD-10-CM

## 2015-11-23 DIAGNOSIS — M8580 Other specified disorders of bone density and structure, unspecified site: Secondary | ICD-10-CM | POA: Diagnosis not present

## 2015-11-23 LAB — FERRITIN: FERRITIN: 7 ng/mL — AB (ref 20–288)

## 2015-11-23 LAB — CBC WITH DIFFERENTIAL/PLATELET
BASOS PCT: 0 %
Basophils Absolute: 0 cells/uL (ref 0–200)
EOS PCT: 1 %
Eosinophils Absolute: 97 cells/uL (ref 15–500)
HEMATOCRIT: 35.5 % (ref 35.0–45.0)
Hemoglobin: 12 g/dL (ref 11.7–15.5)
LYMPHS PCT: 16 %
Lymphs Abs: 1552 cells/uL (ref 850–3900)
MCH: 29.1 pg (ref 27.0–33.0)
MCHC: 33.8 g/dL (ref 32.0–36.0)
MCV: 86.2 fL (ref 80.0–100.0)
MONO ABS: 679 {cells}/uL (ref 200–950)
MPV: 10.4 fL (ref 7.5–12.5)
Monocytes Relative: 7 %
Neutro Abs: 7372 cells/uL (ref 1500–7800)
Neutrophils Relative %: 76 %
PLATELETS: 267 10*3/uL (ref 140–400)
RBC: 4.12 MIL/uL (ref 3.80–5.10)
RDW: 15.3 % — AB (ref 11.0–15.0)
WBC: 9.7 10*3/uL (ref 3.8–10.8)

## 2015-11-23 LAB — TSH: TSH: 2.99 m[IU]/L

## 2015-11-23 LAB — VITAMIN B12: Vitamin B-12: >2000 pg/mL — ABNORMAL HIGH (ref 200–1100)

## 2015-11-23 MED ORDER — IPRATROPIUM BROMIDE 0.03 % NA SOLN: 2.0000 | Freq: Four times a day (QID) | NASAL | 1 refills | Status: DC

## 2015-11-23 NOTE — Patient Instructions (Signed)
     IF you received an x-ray today, you will receive an invoice from [facility]. Please contact [facility] at [phone] with questions or concerns regarding your invoice.   IF you received labwork today, you will receive an invoice from [facility]. Please contact [facility] at [phone] with questions or concerns regarding your invoice.   Our billing staff will not be able to assist you with questions regarding bills from these companies.  You will be contacted with the lab results as soon as they are available. The fastest way to get your results is to activate your My Chart account. Instructions are located on the last page of this paperwork. If you have not heard from us regarding the results in 2 weeks, please contact this office.      

## 2015-11-23 NOTE — Progress Notes (Signed)
Subjective:  By signing my name below, I, [name], attest that this documentation has been prepared under the direction and in the presence of Delman Cheadle, MD. Electronically Signed: [name], [place]. 11/23/2015 , 10:39 AM .  Patient was seen in Room 10 .   Patient ID: Jessica [name], female    DOB: 09/11/1940, 75 y.o.   MRN: [id number] Chief Complaint  Patient presents with  . Suture / Staple Removal    refill on levthyroxine needs to optimux ex   HPI Jessica [name] is a 75 y.o. female who presents to [facility] for suture removal from punch biopsy of labial rash done 5d prior. Jessica [name] moved into [facility] recently so her diet has changed somewhat.  Past Medical History:  Diagnosis Date  . Cataract   . High cholesterol   . Osteopenia   . Pericarditis 01/1999  . Pneumothorax 01/1999  . Thyroid disorder   . Vitamin B 12 deficiency    Prior to Admission medications   Medication Sig Start Date End Date Taking? Authorizing Provider  amoxicillin (AMOXIL) 500 MG capsule TAKE 4 CAPSULES ONE HOUR PRIOR TO DENTAL PROCEDURE. 06/17/15  Yes Elby Beck, FNP  cholecalciferol (VITAMIN D) 1000 UNITS tablet Take 1,000 Units by mouth daily.   Yes Historical Provider, MD  clotrimazole-betamethasone (LOTRISONE) cream Apply 1 application topically 2 (two) times daily. 11/10/15  Yes Shawnee Knapp, MD  co-enzyme Q-10 30 MG capsule Take 30 mg by mouth 3 (three) times daily.   Yes Historical Provider, MD  fish oil-omega-3 fatty acids 1000 MG capsule Take 2 g by mouth daily.   Yes Historical Provider, MD  L-Methylfolate-Algae-B12-B6 Glade Stanford) 3-90.314-2-35 MG CAPS Take 1 tablet by mouth 2 (two) times daily. 02/05/15  Yes Elby Beck, FNP  levothyroxine (SYNTHROID, LEVOTHROID) 112 MCG tablet Take 1 tablet of 112 mcg strength on Tues, Thurs, Sat, and Sun. Alternate with 125 mcg tablets on other days of week. 11/19/15  Yes Shawnee Knapp, MD  levothyroxine (SYNTHROID, LEVOTHROID) 125 MCG  tablet Take 1 tablet of 125 mcg strength on Mon, Wed and Fri. Alternate days with 112 mcg dose on other days of week. 11/19/15  Yes Shawnee Knapp, MD  Misc Natural Products (OSTEO BI-FLEX JOINT SHIELD PO) Take 2 tablets by mouth as directed.    Yes Historical Provider, MD  Red Yeast Rice 600 MG TABS Take 1 tablet by mouth daily.    Yes Historical Provider, MD   Allergies  Allergen Reactions  . Atorvastatin     Muscle weakness  . Rosuvastatin     Muscle weaknes   Review of Systems  Constitutional: Positive for appetite change. Negative for activity change, chills, diaphoresis and fever.  Genitourinary: Positive for dysuria, genital sores and vaginal pain. Negative for decreased urine volume, difficulty urinating, enuresis, flank pain, frequency, hematuria, menstrual problem, pelvic pain, urgency, vaginal bleeding and vaginal discharge.  Allergic/Immunologic: Negative for immunocompromised state.       Objective:   Physical Exam  Constitutional: She is oriented to person, place, and time. She appears well-developed and well-nourished. No distress.  HENT:  Head: Normocephalic and atraumatic.  Eyes: EOM are normal. Pupils are equal, round, and reactive to light.  Neck: Neck supple.  Cardiovascular: Normal rate.   Pulmonary/Chest: Effort normal. No respiratory distress.  Genitourinary:    There is rash and tenderness on the right labia. There is rash and tenderness on the left labia.  Genitourinary Comments: Biopsy site well  healed. Removed suture w/o comp.  Improved somewhat - major and minor labia fused and some scarring around urethra, atrophy of mucosa. Erythema at vaginal introitus sub improved.  Musculoskeletal: Normal range of motion.  Neurological: She is alert and oriented to person, place, and time.  Skin: Skin is warm and dry.  Psychiatric: She has a normal mood and affect. Her behavior is normal.  Nursing note and vitals reviewed.   BP 110/60 (BP Location: Right Arm,  Patient Position: Sitting, Cuff Size: Normal)   Pulse 68   Temp 97.5 F (36.4 C) (Oral)   Resp 16   Ht 5' 4.5" (1.638 m)   Wt 152 lb 12.8 oz (69.3 kg)   SpO2 96%   BMI 25.82 kg/m     Assessment & Plan:   1. Hypothyroidism, unspecified hypothyroidism type   2. Perineal rash in female   3. Anemia, unspecified anemia type   4. Osteopenia   5. Vitamin B12 deficiency   6. Postmenopausal estrogen deficiency   7. URI, acute   8. Contact dermatitis    Biopsy of labial rash still Pending.  Orders Placed This Encounter  Procedures  . TSH  . VITAMIN D 25 Hydroxy (Vit-D Deficiency, Fractures)  . Vitamin B12  . Methylmalonic Acid, Serum  . CBC with Differential/Platelet  . Ferritin  . Ambulatory referral to Gynecology    Referral Priority:   Routine    Referral Type:   Consultation    Referral Reason:   Specialty Services Required    Requested Specialty:   Gynecology    Number of Visits Requested:   1    Meds ordered this encounter  Medications  . ipratropium (ATROVENT) 0.03 % nasal spray    Sig: Place 2 sprays into the nose 4 (four) times daily.    Dispense:  30 mL    Refill:  1  . DISCONTD: levothyroxine (SYNTHROID, LEVOTHROID) 125 MCG tablet    Sig: Take 1 tablet of 125 mcg strength on Mon, Wed and Fri. Alternate days with 112 mcg dose on other days of week.    Dispense:  30 tablet    Refill:  0    Temp supply while waiting on mail order  . DISCONTD: levothyroxine (SYNTHROID, LEVOTHROID) 112 MCG tablet    Sig: Take 1 tablet of 112 mcg strength on Tues, Thurs, Sat, and Sun. Alternate with 125 mcg tablets on other days of week.    Dispense:  30 tablet    Refill:  0    Temp supply while waiting on mail order  . levothyroxine (SYNTHROID, LEVOTHROID) 112 MCG tablet    Sig: Take 1 tablet of 112 mcg strength on Tues, Thurs, Sat, and Sun. Alternate with 125 mcg tablets on other days of week.    Dispense:  52 tablet    Refill:  3  . levothyroxine (SYNTHROID, LEVOTHROID) 125  MCG tablet    Sig: Take 1 tablet of 125 mcg strength on Mon, Wed and Fri. Alternate days with 112 mcg dose on other days of week.    Dispense:  40 tablet    Refill:  3    I personally performed the services described in this documentation, which was scribed in my presence. The recorded information has been reviewed and considered, and addended by me as needed.   Delman Cheadle, M.D.  [facility] [address] [place], [place] [address] [phone] phone [phone] fax  12/06/15 9:51 PM

## 2015-11-24 LAB — VITAMIN D 25 HYDROXY (VIT D DEFICIENCY, FRACTURES): VIT D 25 HYDROXY: 45 ng/mL (ref 30–100)

## 2015-11-26 ENCOUNTER — Encounter: Payer: Self-pay | Admitting: Family Medicine

## 2015-11-26 LAB — METHYLMALONIC ACID, SERUM: METHYLMALONIC ACID, QUANT: 124 nmol/L (ref 87–318)

## 2015-11-30 ENCOUNTER — Telehealth: Payer: Self-pay

## 2015-11-30 MED ORDER — LEVOTHYROXINE SODIUM 112 MCG PO TABS: ORAL_TABLET | ORAL | 0 refills | Status: DC

## 2015-11-30 MED ORDER — LEVOTHYROXINE SODIUM 125 MCG PO TABS: ORAL_TABLET | ORAL | 0 refills | Status: DC

## 2015-11-30 MED ORDER — LEVOTHYROXINE SODIUM 125 MCG PO TABS: ORAL_TABLET | ORAL | 3 refills | Status: DC

## 2015-11-30 MED ORDER — LEVOTHYROXINE SODIUM 112 MCG PO TABS: ORAL_TABLET | ORAL | 3 refills | Status: DC

## 2015-11-30 NOTE — Telephone Encounter (Signed)
Pt stated that she got it thanks

## 2015-11-30 NOTE — Telephone Encounter (Signed)
SHAW - Pt never heard back from you about the results after a week.  Symptoms have gotten worse and because she has not heard anything she started using some cream she had.  Please call asap [phone]

## 2015-11-30 NOTE — Telephone Encounter (Signed)
Just sent to MyChart - please call and see if pt got it.

## 2015-12-03 ENCOUNTER — Encounter: Payer: Self-pay | Admitting: Family Medicine

## 2015-12-04 NOTE — Telephone Encounter (Signed)
Dr Brigitte Pulse, I will also respond to this pt to let her know the status of her referral.

## 2015-12-06 DIAGNOSIS — D649 Anemia, unspecified: Secondary | ICD-10-CM

## 2015-12-06 HISTORY — DX: Anemia, unspecified: D64.9

## 2015-12-16 DIAGNOSIS — Z124 Encounter for screening for malignant neoplasm of cervix: Secondary | ICD-10-CM | POA: Diagnosis not present

## 2015-12-16 DIAGNOSIS — Z01419 Encounter for gynecological examination (general) (routine) without abnormal findings: Secondary | ICD-10-CM | POA: Diagnosis not present

## 2016-02-15 ENCOUNTER — Encounter: Payer: Self-pay | Admitting: Internal Medicine

## 2016-02-15 MED ORDER — METANX 3-90.314-2-35 MG PO CAPS: 1.0000 | ORAL_CAPSULE | Freq: Two times a day (BID) | ORAL | 3 refills | Status: DC

## 2016-03-01 ENCOUNTER — Ambulatory Visit: Payer: Medicare Other | Admitting: Internal Medicine

## 2016-03-04 ENCOUNTER — Other Ambulatory Visit: Payer: Self-pay | Admitting: Family Medicine

## 2016-03-04 DIAGNOSIS — E039 Hypothyroidism, unspecified: Secondary | ICD-10-CM

## 2016-03-11 DIAGNOSIS — L602 Onychogryphosis: Secondary | ICD-10-CM | POA: Diagnosis not present

## 2016-03-11 DIAGNOSIS — M79671 Pain in right foot: Secondary | ICD-10-CM | POA: Diagnosis not present

## 2016-03-11 DIAGNOSIS — M2011 Hallux valgus (acquired), right foot: Secondary | ICD-10-CM | POA: Diagnosis not present

## 2016-03-11 DIAGNOSIS — L84 Corns and callosities: Secondary | ICD-10-CM | POA: Diagnosis not present

## 2016-03-11 DIAGNOSIS — M79672 Pain in left foot: Secondary | ICD-10-CM | POA: Diagnosis not present

## 2016-03-11 DIAGNOSIS — M2041 Other hammer toe(s) (acquired), right foot: Secondary | ICD-10-CM | POA: Diagnosis not present

## 2016-04-28 ENCOUNTER — Other Ambulatory Visit: Payer: Self-pay | Admitting: Family Medicine

## 2016-04-28 DIAGNOSIS — E039 Hypothyroidism, unspecified: Secondary | ICD-10-CM

## 2016-05-03 ENCOUNTER — Encounter: Payer: Self-pay | Admitting: Internal Medicine

## 2016-05-03 ENCOUNTER — Non-Acute Institutional Stay: Payer: PPO | Admitting: Internal Medicine

## 2016-05-03 VITALS — BP 118/74 | HR 72 | Temp 98.2°F | Ht 65.0 in | Wt 154.0 lb

## 2016-05-03 DIAGNOSIS — H9190 Unspecified hearing loss, unspecified ear: Secondary | ICD-10-CM

## 2016-05-03 DIAGNOSIS — H9193 Unspecified hearing loss, bilateral: Secondary | ICD-10-CM | POA: Diagnosis not present

## 2016-05-03 DIAGNOSIS — E039 Hypothyroidism, unspecified: Secondary | ICD-10-CM

## 2016-05-03 DIAGNOSIS — E782 Mixed hyperlipidemia: Secondary | ICD-10-CM

## 2016-05-03 DIAGNOSIS — R200 Anesthesia of skin: Secondary | ICD-10-CM

## 2016-05-03 HISTORY — DX: Unspecified hearing loss, unspecified ear: H91.90

## 2016-05-03 NOTE — Progress Notes (Signed)
HISTORY AND PHYSICAL  Location:    [place]   Place of Service: Clinic (12)   Extended Emergency Contact Information Primary Emergency Contact: Delillo,Nicholas Address: [address] [place] [address] [place] Phone: [phone] Mobile Phone: [phone] Relation: Son Secondary Emergency Contact: Cornell Barman Address: [place] Phone: [phone] Relation: Other  Advanced Directive information Does Patient Have a Medical Advance Directive?: Yes, Type of Advance Directive: [place];Living will  Chief Complaint  Patient presents with  . Medical Management of Chronic Issues    New Patient  . iron    had low iron in past    HPI:  Moving her medical care to [facility]. Moved to [facility] 04/27/15.  General health has been good. Moved because she fell twice backwards and she realized that it would be better for her to live where there is more people.  Currently has compensated hypothyroidism and an ongoing issue with perineal itching for which she uses clobetasol.    Past Medical History:  Diagnosis Date  . Anemia 12/2015  . Cataract   . High cholesterol   . Osteopenia   . Pericarditis 01/1999  . Pneumothorax 01/1999  . Thyroid disorder   . Vitamin B 12 deficiency     Past Surgical History:  Procedure Laterality Date  . birthmark      removed,left leg  . CATARACT EXTRACTION  2017  . [place]?    Patient Care Team: Shawnee Knapp, MD as PCP - General (Family Medicine) Ronald Lobo, MD as Consulting Physician (Gastroenterology) Katy Apo, MD as Consulting Physician (Ophthalmology) Amy Martinique, MD as Consulting Physician (Dermatology)  Social History   Social History  . Marital status: Divorced    Spouse name: N/A  . Number of children: 1  . Years of education: college   Occupational History  . retired    Social History Main Topics   . Smoking status: Never Smoker  . Smokeless tobacco: Never Used  . Alcohol use 1.8 oz/week    3 Glasses of wine per week  . Drug use: No  . Sexual activity: Not on file   Other Topics Concern  . Not on file   Social History Narrative   Moved to [facility] 01/14/2015   Do you drink/eat things with caffeine?  yes   Marital status?      divorced                               Do you live in a house, apartment, assisted living, condo, trailer, etc.?  Retirement community   Is it one or more stories? Yes, 3   How many persons live in your home?  1   Do you have any pets in your home? (please list) 0   Current or past profession:  Librarian   Do you exercise?     yes                                 Type & how often? Walk-varies   Do you have a living will? yes   Do you have a DNR form?         no  If not, do you want to discuss one? yes   Do you have signed POA/HPOA for forms? yes       reports that she has never smoked. She has never used smokeless tobacco. She reports that she drinks about 1.8 oz of alcohol per week . She reports that she does not use drugs.  Family History  Problem Relation Age of Onset  . Diabetes Mother   . Asthma Mother   . Ataxia Father   . Cancer - Cervical Sister   . Thyroid disease Sister   . Diabetes Other   . Diabetes Maternal Grandmother   . Arthritis Paternal Grandmother   . Colon cancer Neg Hx    Family Status  Relation Status  . Mother Deceased at age 53   82 age  . Father Deceased at age 36   ataxia, stroke  . Sister Alive  . Other Alive  . Maternal Grandmother Deceased  . Paternal Grandmother Deceased  . Son Alive  . Maternal Grandfather Deceased  . Paternal Grandfather Deceased  . Neg Hx     Immunization History  Administered Date(s) Administered  . Influenza Whole 12/29/2006  . Influenza,inj,Quad PF,36+ Mos 12/04/2013, 11/12/2014, 11/18/2015  . Influenza-Unspecified 12/02/2011, 11/15/2012   . PPD Test 01/05/2015  . Pneumococcal Conjugate-13 08/19/2014  . Pneumococcal Polysaccharide-23 11/18/2015  . Td 06/02/2008  . Zoster 03/08/2011  . Zoster Subunit (Shingrix) 05/02/2016    Allergies  Allergen Reactions  . Atorvastatin     Muscle weakness  . Rosuvastatin     Muscle weaknes    Medications: Patient's Medications  New Prescriptions   No medications on file  Previous Medications   AMOXICILLIN (AMOXIL) 500 MG CAPSULE    Take 4 tablets prior to dental   CHOLECALCIFEROL (VITAMIN D) 1000 UNITS TABLET    Take 1,000 Units by mouth daily.   CLOBETASOL PROP EMOLLIENT BASE (CLOBETASOL PROPIONATE E) 0.05 % EMOLLIENT CREAM    Apply topically. Apply twice daily   FERROUS SULFATE DRIED (SLOW IRON PO)    Take by mouth. Take one tablet daily for iron   FISH OIL-OMEGA-3 FATTY ACIDS 1000 MG CAPSULE    Take 2 g by mouth daily.   L-METHYLFOLATE-ALGAE-B12-B6 (METANX) 3-90.314-2-35 MG CAPS    Take 1 tablet by mouth 2 (two) times daily.   LEVOTHYROXINE (SYNTHROID, LEVOTHROID) 112 MCG TABLET    Take 1 tablet of 112 mcg strength on Tues, Thurs, Sat, and Sun. Alternate with 125 mcg tablets on other days of week.   LEVOTHYROXINE (SYNTHROID, LEVOTHROID) 125 MCG TABLET    Take 1 tablet of 125 mcg strength on Mon, Wed and Fri. Alternate days with 112 mcg dose on other days of week.   MISC NATURAL PRODUCTS (OSTEO BI-FLEX JOINT SHIELD PO)    Take 2 tablets by mouth as directed.    RED YEAST RICE 600 MG TABS    Take 1 tablet by mouth daily.    SHINGRIX 50 MCG SUSR    1st dose 05/02/16 at [facility]  Modified Medications   No medications on file  Discontinued Medications   CLOTRIMAZOLE-BETAMETHASONE (LOTRISONE) CREAM    Apply 1 application topically 2 (two) times daily.   CO-ENZYME Q-10 30 MG CAPSULE    Take 30 mg by mouth 3 (three) times daily.   IPRATROPIUM (ATROVENT) 0.03 % NASAL SPRAY    Place 2 sprays into the nose 4 (four) times daily.   LEVOTHYROXINE (SYNTHROID, LEVOTHROID) 112 MCG TABLET    TAKE 1  TABLET  OF 112 MCG STRENGTH ON TUES, THURS, SAT, AND SUN AND TAKE 125MCG ON OTHER DAYS   LEVOTHYROXINE (SYNTHROID, LEVOTHROID) 125 MCG TABLET    TAKE 1 TABLET OF 125 MCG ON MON, WED AND FRI AND TAKE 112MCG ON OTHER DAYS OF THE WEEK    Review of Systems  Constitutional: Negative for activity change, appetite change, chills, diaphoresis, fatigue, fever and unexpected weight change.  HENT: Positive for hearing loss (bilateral aids) and tinnitus. Negative for congestion, ear discharge, ear pain, postnasal drip, rhinorrhea, sore throat, trouble swallowing and voice change.   Eyes: Positive for visual disturbance (corrective lenses). Negative for pain, redness and itching.  Respiratory: Positive for shortness of breath (mild on exertion). Negative for cough, choking and wheezing.   Cardiovascular: Negative for chest pain, palpitations and leg swelling.       Hx pericarditis  Gastrointestinal: Negative for abdominal distention, abdominal pain, constipation, diarrhea and nausea.       Hx hemorrhoids  Endocrine: Negative for cold intolerance, heat intolerance, polydipsia, polyphagia and polyuria.       Hypothyroid  Genitourinary: Negative for difficulty urinating, dysuria, flank pain, frequency, hematuria, pelvic pain, urgency and vaginal discharge.       Perineal irritation--using clobetasol  Musculoskeletal: Negative for arthralgias, back pain, gait problem, myalgias, neck pain and neck stiffness.  Skin: Negative for color change, pallor and rash.  Allergic/Immunologic: Negative.   Neurological: Negative for dizziness, tremors, seizures, syncope, weakness, numbness and headaches.  Hematological: Negative for adenopathy. Does not bruise/bleed easily.  Psychiatric/Behavioral: Negative for agitation, behavioral problems, confusion, dysphoric mood, hallucinations, sleep disturbance and suicidal ideas. The patient is not nervous/anxious and is not hyperactive.     Vitals:   05/03/16 1033  BP: 118/74    Pulse: 72  Temp: 98.2 F (36.8 C)  TempSrc: Oral  SpO2: 97%  Weight: 154 lb (69.9 kg)  Height: 5' 5"  (1.651 m)   Body mass index is 25.63 kg/m. Filed Weights   05/03/16 1033  Weight: 154 lb (69.9 kg)     Physical Exam  Constitutional: She is oriented to person, place, and time. She appears well-developed and well-nourished. No distress.  HENT:  Right Ear: External ear normal.  Left Ear: External ear normal.  Nose: Nose normal.  Mouth/Throat: Oropharynx is clear and moist. No oropharyngeal exudate.  Eyes: Conjunctivae and EOM are normal. Pupils are equal, round, and reactive to light. No scleral icterus.  Neck: No JVD present. No tracheal deviation present. No thyromegaly present.  Cardiovascular: Normal rate, regular rhythm, normal heart sounds and intact distal pulses.  Exam reveals no gallop and no friction rub.   No murmur heard. Pulmonary/Chest: Effort normal. No respiratory distress. She has no wheezes. She has no rales. She exhibits no tenderness.  Abdominal: She exhibits no distension and no mass. There is no tenderness.  Musculoskeletal: Normal range of motion. She exhibits no edema or tenderness.  Lymphadenopathy:    She has no cervical adenopathy.  Neurological: She is alert and oriented to person, place, and time. No cranial nerve deficit. Coordination normal.  Skin: No rash noted. She is not diaphoretic. No erythema. No pallor.  Psychiatric: She has a normal mood and affect. Her behavior is normal. Judgment and thought content normal.    Labs reviewed: Lab Summary Latest Ref Rng & Units 11/23/2015 11/12/2014 10/08/2014 08/19/2014 02/24/2014 02/13/2014  Hemoglobin 11.7 - 15.5 g/dL 12.0 11.1(L) 11.4(A) 11.1(L) (None) (None)  Hematocrit 35.0 - 45.0 % 35.5 34.8(L) 35.1(A) 34.4(L) (None) (None)  White  count 3.8 - 10.8 K/uL 9.7 9.0 7.6 7.4 (None) (None)  Platelet count 140 - 400 K/uL 267 295 (None) 316 (None) (None)  Sodium 135 - 145 mEq/L (None) (None) (None) (None)  (None) 140  Potassium 3.5 - 5.3 mEq/L (None) (None) (None) (None) (None) 4.7  Calcium 8.4 - 10.5 mg/dL (None) (None) (None) (None) (None) 9.2  Phosphorus - (None) (None) (None) (None) (None) (None)  Creatinine 0.50 - 1.10 mg/dL (None) (None) (None) (None) (None) 0.61  AST 0 - 37 U/L (None) (None) (None) (None) (None) 16  Alk Phos 39 - 117 U/L (None) (None) (None) (None) (None) 77  Bilirubin 0.2 - 1.2 mg/dL (None) (None) (None) (None) (None) 0.4  Glucose 70 - 99 mg/dL (None) (None) (None) (None) (None) 84  Cholesterol 0 - 200 mg/dL (None) (None) (None) (None) 198 (None)  HDL cholesterol >39 mg/dL (None) (None) (None) (None) 54 (None)  Triglycerides <150 mg/dL (None) (None) (None) (None) 92 (None)  LDL Direct - (None) (None) (None) (None) (None) (None)  LDL Calc 0 - 99 mg/dL (None) (None) (None) (None) 126(H) (None)  Total protein 6.0 - 8.3 g/dL (None) (None) (None) (None) (None) 6.6  Albumin 3.5 - 5.2 g/dL (None) (None) (None) (None) (None) 4.0  Some recent data might be hidden   Lab Results  Component Value Date   BUN 17 02/13/2014   No results found for: HGBA1C Lab Results  Component Value Date   TSH 2.99 11/23/2015   T4TOTAL 6.7 02/13/2014     Assessment/Plan  1. Hypothyroidism, unspecified type compensated  2. HYPERLIPIDEMIA Follow lab at future visit  3. Numbness Hx of decreased sensation in the feet bilaterally. It is better since using Metnax  4. Bilateral hearing loss, unspecified hearing loss type contine use of hearing aids

## 2016-05-16 ENCOUNTER — Other Ambulatory Visit: Payer: Self-pay | Admitting: *Deleted

## 2016-05-16 MED ORDER — LEVOTHYROXINE SODIUM 125 MCG PO TABS: ORAL_TABLET | ORAL | 3 refills | Status: DC

## 2016-05-16 MED ORDER — LEVOTHYROXINE SODIUM 112 MCG PO TABS: ORAL_TABLET | ORAL | 3 refills | Status: DC

## 2016-05-16 NOTE — Telephone Encounter (Signed)
Patient requested to be sent to [facility].

## 2016-05-19 ENCOUNTER — Telehealth: Payer: Self-pay

## 2016-05-19 ENCOUNTER — Other Ambulatory Visit: Payer: Self-pay | Admitting: Internal Medicine

## 2016-05-19 DIAGNOSIS — D649 Anemia, unspecified: Secondary | ICD-10-CM | POA: Insufficient documentation

## 2016-05-19 NOTE — Telephone Encounter (Signed)
Message left on clinical intake voicemail:   Patient is requesting a lab order to check iron levels at [facility]. Patient would like a call to let her know when she can get labs done and if she will need to schedule a follow-up to discuss labs.  Please review and advise

## 2016-05-19 NOTE — Telephone Encounter (Signed)
Patient called again, left message on clinical intake voicemail indicating she is also woozy, off balance and with vision changes.   No available appointments today or tomorrow, please advise

## 2016-05-19 NOTE — Telephone Encounter (Signed)
Will forward to D. Williams to contact lab at [facility]. Lab; CBC, ferritin, Iron studies. I have entered lab in computer.

## 2016-05-19 NOTE — Telephone Encounter (Signed)
I will see her next week as planned.

## 2016-05-19 NOTE — Telephone Encounter (Signed)
Spoke with patient, she feels she may need other lab test tool Suggest she see Dr. Nyoka Cowden first then if he wants to add other test he can and we would call her with the results. She liked that idea. Appt next Tues [facility] 9:00 05/24/16

## 2016-05-24 ENCOUNTER — Non-Acute Institutional Stay: Payer: PPO | Admitting: Internal Medicine

## 2016-05-24 ENCOUNTER — Encounter: Payer: Self-pay | Admitting: Internal Medicine

## 2016-05-24 VITALS — BP 112/82 | HR 66 | Temp 97.9°F | Ht 65.0 in | Wt 156.0 lb

## 2016-05-24 DIAGNOSIS — R413 Other amnesia: Secondary | ICD-10-CM

## 2016-05-24 DIAGNOSIS — R2689 Other abnormalities of gait and mobility: Secondary | ICD-10-CM | POA: Diagnosis not present

## 2016-05-24 DIAGNOSIS — E039 Hypothyroidism, unspecified: Secondary | ICD-10-CM | POA: Diagnosis not present

## 2016-05-24 DIAGNOSIS — D649 Anemia, unspecified: Secondary | ICD-10-CM

## 2016-05-24 NOTE — Progress Notes (Signed)
Facility  [facility]    Place of Service: Clinic (12)     Allergies  Allergen Reactions  . Atorvastatin     Muscle weakness  . Rosuvastatin     Muscle weaknes    Chief Complaint  Patient presents with  . Acute Visit    dizzy, not daily, makes her loose her balance, thinks she was dehydrated  . Blurred Vision    comes and goes  . "cotton in head"    head feels funny, been going on 3-4 weeks.     HPI:   Poor balance. Not a vertigo. Staggering. No falls. Worries that stopping the iron made her feel fatigued. Maybe her blood counts have dropped. Falling asleep all the time. Falls asleep at intersections when the light would change.   Head feels larger than it should. Cotton in the head. Some trouble sleeping at night. Wakes in the middle of the night. Melatonin may help.  Vaginal itching still present. Using clobetasol for relief.  05/19/12 MR brain: Normal study for person of this age.  Minimal age related atrophy and small vessel white matter change, less than often seen in healthy individuals of this age.  Has noted a change in her short term memory.  Medications: Patient's Medications  New Prescriptions   No medications on file  Previous Medications   AMOXICILLIN (AMOXIL) 500 MG CAPSULE    Take 4 tablets prior to dental   CHOLECALCIFEROL (VITAMIN D) 1000 UNITS TABLET    Take 1,000 Units by mouth daily.   CLOBETASOL OINTMENT (TEMOVATE) 0.05 %    Apply 1 application topically. Apply twice daily   FERROUS SULFATE DRIED (SLOW IRON PO)    Take by mouth. Take one tablet daily for iron   FISH OIL-OMEGA-3 FATTY ACIDS 1000 MG CAPSULE    Take 2 g by mouth daily.   L-METHYLFOLATE-ALGAE-B12-B6 (METANX) 3-90.314-2-35 MG CAPS    Take 1 tablet by mouth 2 (two) times daily.   LEVOTHYROXINE (SYNTHROID, LEVOTHROID) 112 MCG TABLET    Take 1 tablet of 112 mcg strength on Tues, Thurs, Sat, and Sun. Alternate with 125 mcg tablets on other days of week.   LEVOTHYROXINE (SYNTHROID,  LEVOTHROID) 125 MCG TABLET    Take 1 tablet of 125 mcg strength on Mon, Wed and Fri. Alternate days with 112 mcg dose on other days of week.   MISC NATURAL PRODUCTS (OSTEO BI-FLEX JOINT SHIELD PO)    Take 2 tablets by mouth as directed.    RED YEAST RICE 600 MG TABS    Take 1 tablet by mouth daily.   Modified Medications   No medications on file  Discontinued Medications   CLOBETASOL PROP EMOLLIENT BASE (CLOBETASOL PROPIONATE E) 0.05 % EMOLLIENT CREAM    Apply topically. Apply twice daily   SHINGRIX 50 MCG SUSR    1st dose 05/02/16 at [facility]     Review of Systems  Constitutional: Negative for activity change, appetite change, chills, diaphoresis, fatigue, fever and unexpected weight change.  HENT: Positive for hearing loss (bilateral aids) and tinnitus. Negative for congestion, ear discharge, ear pain, postnasal drip, rhinorrhea, sore throat, trouble swallowing and voice change.   Eyes: Positive for visual disturbance (corrective lenses). Negative for pain, redness and itching.  Respiratory: Positive for shortness of breath (mild on exertion). Negative for cough, choking and wheezing.   Cardiovascular: Negative for chest pain, palpitations and leg swelling.       Hx pericarditis  Gastrointestinal: Negative for abdominal distention, abdominal  pain, constipation, diarrhea and nausea.       Hx hemorrhoids  Endocrine: Negative for cold intolerance, heat intolerance, polydipsia, polyphagia and polyuria.       Hypothyroid  Genitourinary: Negative for difficulty urinating, dysuria, flank pain, frequency, hematuria, pelvic pain, urgency and vaginal discharge.       Perineal irritation--using clobetasol  Musculoskeletal: Negative for arthralgias, back pain, gait problem, myalgias, neck pain and neck stiffness.  Skin: Negative for color change, pallor and rash.  Allergic/Immunologic: Negative.   Neurological: Negative for dizziness, tremors, seizures, syncope, weakness, numbness and headaches.    Hematological: Negative for adenopathy. Does not bruise/bleed easily.  Psychiatric/Behavioral: Negative for agitation, behavioral problems, confusion, dysphoric mood, hallucinations, sleep disturbance and suicidal ideas. The patient is not nervous/anxious and is not hyperactive.     Vitals:   05/24/16 0914  BP: 112/82  Pulse: 66  Temp: 97.9 F (36.6 C)  TempSrc: Oral  SpO2: 92%  Weight: 156 lb (70.8 kg)  Height: '5\' 5"'$  (1.651 m)   Wt Readings from Last 3 Encounters:  05/24/16 156 lb (70.8 kg)  05/03/16 154 lb (69.9 kg)  11/23/15 152 lb 12.8 oz (69.3 kg)    Body mass index is 25.96 kg/m.  Physical Exam  Constitutional: She is oriented to person, place, and time. She appears well-developed and well-nourished. No distress.  HENT:  Right Ear: External ear normal.  Left Ear: External ear normal.  Nose: Nose normal.  Mouth/Throat: Oropharynx is clear and moist. No oropharyngeal exudate.  Eyes: Conjunctivae and EOM are normal. Pupils are equal, round, and reactive to light. No scleral icterus.  Neck: No JVD present. No tracheal deviation present. No thyromegaly present.  Cardiovascular: Normal rate, regular rhythm, normal heart sounds and intact distal pulses.  Exam reveals no gallop and no friction rub.   No murmur heard. Pulmonary/Chest: Effort normal. No respiratory distress. She has no wheezes. She has no rales. She exhibits no tenderness.  Abdominal: She exhibits no distension and no mass. There is no tenderness.  Musculoskeletal: Normal range of motion. She exhibits no edema or tenderness.  Lymphadenopathy:    She has no cervical adenopathy.  Neurological: She is alert and oriented to person, place, and time. No cranial nerve deficit. Coordination normal.  Skin: No rash noted. She is not diaphoretic. No erythema. No pallor.  Psychiatric: She has a normal mood and affect. Her behavior is normal. Judgment and thought content normal.     Labs reviewed: Lab Summary Latest  Ref Rng & Units 11/23/2015 11/12/2014 10/08/2014 08/19/2014  Hemoglobin 11.7 - 15.5 g/dL 12.0 11.1(L) 11.4(A) 11.1(L)  Hematocrit 35.0 - 45.0 % 35.5 34.8(L) 35.1(A) 34.4(L)  White count 3.8 - 10.8 K/uL 9.7 9.0 7.6 7.4  Platelet count 140 - 400 K/uL 267 295 (None) 316  Sodium 135-145 mmol/L (None) (None) (None) (None)  Potassium 3.5-5.1 mmol/L (None) (None) (None) (None)  Calcium - (None) (None) (None) (None)  Phosphorus - (None) (None) (None) (None)  Creatinine - (None) (None) (None) (None)  AST - (None) (None) (None) (None)  Alk Phos - (None) (None) (None) (None)  Bilirubin - (None) (None) (None) (None)  Glucose 70-99 mg/dL (None) (None) (None) (None)  Cholesterol - (None) (None) (None) (None)  HDL cholesterol - (None) (None) (None) (None)  Triglycerides - (None) (None) (None) (None)  LDL Direct - (None) (None) (None) (None)  LDL Calc - (None) (None) (None) (None)  Total protein - (None) (None) (None) (None)  Albumin - (None) (None) (None) (None)  Some recent  data might be hidden   Lab Results  Component Value Date   TSH 2.99 11/23/2015   Lab Results  Component Value Date   BUN 17 02/13/2014   BUN 15 04/18/2013   BUN 15 03/08/2008   Lab Results  Component Value Date   CREATININE 0.61 02/13/2014   CREATININE 0.59 04/18/2013   CREATININE 0.62 03/08/2008   No results found for: HGBA1C     Assessment/Plan  1. Balance disorder New onset May need MR brain depending on lab test results. - Comprehensive metabolic panel; Future  2. Memory deficit - Comprehensive metabolic panel; Future  3. Anemia, unspecified type - CBC with Differential/Platelet; Future  4. Hypothyroidism, unspecified type - TSH; Future

## 2016-05-26 ENCOUNTER — Other Ambulatory Visit: Payer: Self-pay

## 2016-05-26 DIAGNOSIS — D649 Anemia, unspecified: Secondary | ICD-10-CM

## 2016-05-27 ENCOUNTER — Other Ambulatory Visit: Payer: Self-pay

## 2016-05-27 DIAGNOSIS — E039 Hypothyroidism, unspecified: Secondary | ICD-10-CM

## 2016-05-27 DIAGNOSIS — D649 Anemia, unspecified: Secondary | ICD-10-CM

## 2016-05-27 DIAGNOSIS — R2689 Other abnormalities of gait and mobility: Secondary | ICD-10-CM

## 2016-05-27 DIAGNOSIS — R413 Other amnesia: Secondary | ICD-10-CM

## 2016-05-30 ENCOUNTER — Other Ambulatory Visit: Payer: Self-pay

## 2016-05-30 DIAGNOSIS — E039 Hypothyroidism, unspecified: Secondary | ICD-10-CM | POA: Diagnosis not present

## 2016-05-30 DIAGNOSIS — R2689 Other abnormalities of gait and mobility: Secondary | ICD-10-CM | POA: Diagnosis not present

## 2016-05-30 DIAGNOSIS — D649 Anemia, unspecified: Secondary | ICD-10-CM | POA: Diagnosis not present

## 2016-05-30 DIAGNOSIS — R413 Other amnesia: Secondary | ICD-10-CM | POA: Diagnosis not present

## 2016-05-30 LAB — CBC WITH DIFFERENTIAL/PLATELET
BASOS ABS: 0 {cells}/uL (ref 0–200)
BASOS PCT: 0 %
EOS ABS: 174 {cells}/uL (ref 15–500)
Eosinophils Relative: 2 %
HEMATOCRIT: 39.6 % (ref 35.0–45.0)
Hemoglobin: 13.2 g/dL (ref 11.7–15.5)
LYMPHS PCT: 33 %
Lymphs Abs: 2871 cells/uL (ref 850–3900)
MCH: 31.9 pg (ref 27.0–33.0)
MCHC: 33.3 g/dL (ref 32.0–36.0)
MCV: 95.7 fL (ref 80.0–100.0)
MONO ABS: 609 {cells}/uL (ref 200–950)
MPV: 10.6 fL (ref 7.5–12.5)
Monocytes Relative: 7 %
NEUTROS ABS: 5046 {cells}/uL (ref 1500–7800)
Neutrophils Relative %: 58 %
Platelets: 313 10*3/uL (ref 140–400)
RBC: 4.14 MIL/uL (ref 3.80–5.10)
RDW: 14 % (ref 11.0–15.0)
WBC: 8.7 10*3/uL (ref 3.8–10.8)

## 2016-05-30 LAB — COMPREHENSIVE METABOLIC PANEL
ALK PHOS: 64 U/L (ref 33–130)
ALT: 15 U/L (ref 6–29)
AST: 15 U/L (ref 10–35)
Albumin: 4.1 g/dL (ref 3.6–5.1)
BUN: 11 mg/dL (ref 7–25)
CALCIUM: 9 mg/dL (ref 8.6–10.4)
CHLORIDE: 105 mmol/L (ref 98–110)
CO2: 27 mmol/L (ref 20–31)
Creat: 0.65 mg/dL (ref 0.60–0.93)
Glucose, Bld: 86 mg/dL (ref 65–99)
Potassium: 4 mmol/L (ref 3.5–5.3)
Sodium: 139 mmol/L (ref 135–146)
TOTAL PROTEIN: 6.6 g/dL (ref 6.1–8.1)
Total Bilirubin: 0.6 mg/dL (ref 0.2–1.2)

## 2016-05-30 LAB — IRON AND TIBC
%SAT: 26 % (ref 11–50)
Iron: 85 ug/dL (ref 45–160)
TIBC: 327 ug/dL (ref 250–450)
UIBC: 242 ug/dL (ref 125–400)

## 2016-05-30 LAB — TSH: TSH: 3.99 m[IU]/L

## 2016-05-30 LAB — FERRITIN: Ferritin: 18 ng/mL — ABNORMAL LOW (ref 20–288)

## 2016-06-07 ENCOUNTER — Encounter: Payer: Self-pay | Admitting: Internal Medicine

## 2016-06-07 ENCOUNTER — Non-Acute Institutional Stay: Payer: PPO | Admitting: Internal Medicine

## 2016-06-07 VITALS — BP 112/72 | HR 67 | Temp 97.4°F | Ht 65.0 in | Wt 156.0 lb

## 2016-06-07 DIAGNOSIS — R413 Other amnesia: Secondary | ICD-10-CM | POA: Diagnosis not present

## 2016-06-07 DIAGNOSIS — E039 Hypothyroidism, unspecified: Secondary | ICD-10-CM | POA: Diagnosis not present

## 2016-06-07 DIAGNOSIS — D649 Anemia, unspecified: Secondary | ICD-10-CM | POA: Diagnosis not present

## 2016-06-07 DIAGNOSIS — R2689 Other abnormalities of gait and mobility: Secondary | ICD-10-CM | POA: Diagnosis not present

## 2016-06-07 DIAGNOSIS — R29898 Other symptoms and signs involving the musculoskeletal system: Secondary | ICD-10-CM

## 2016-06-07 NOTE — Progress Notes (Addendum)
Facility  [facility]    Place of Service: Clinic (12)     Allergies  Allergen Reactions  . Atorvastatin     Muscle weakness  . Rosuvastatin     Muscle weaknes    Chief Complaint  Patient presents with  . Medical Management of Chronic Issues    3 week medication management of balance, memory, anemia, thyroid, review labs.   . Leg Problem    off and on leg weakness, left leg worse, has trouble getting up.     HPI:  Continues with wobbly feelings. No falls. Denies headaches. No nausea or palpitations.  Developed a painless weakness in the legs. Left is worse than the right. No numbness. She continues to walk without increased effort. Has some difficulty getting up at times. Comes and goes.  All lab was normal: CBC, TSH, CMP  Medications: Patient's Medications  New Prescriptions   No medications on file  Previous Medications   AMOXICILLIN (AMOXIL) 500 MG CAPSULE    Take 4 tablets prior to dental   CHOLECALCIFEROL (VITAMIN D) 1000 UNITS TABLET    Take 1,000 Units by mouth daily.   FERROUS SULFATE DRIED (SLOW IRON PO)    Take by mouth. Take one tablet daily for iron   FISH OIL-OMEGA-3 FATTY ACIDS 1000 MG CAPSULE    Take 2 g by mouth daily.   L-METHYLFOLATE-ALGAE-B12-B6 (METANX) 3-90.314-2-35 MG CAPS    Take 1 tablet by mouth 2 (two) times daily.   LEVOTHYROXINE (SYNTHROID, LEVOTHROID) 112 MCG TABLET    Take 1 tablet of 112 mcg strength on Tues, Thurs, Sat, and Sun. Alternate with 125 mcg tablets on other days of week.   LEVOTHYROXINE (SYNTHROID, LEVOTHROID) 125 MCG TABLET    Take 1 tablet of 125 mcg strength on Mon, Wed and Fri. Alternate days with 112 mcg dose on other days of week.   MISC NATURAL PRODUCTS (OSTEO BI-FLEX JOINT SHIELD PO)    Take 2 tablets by mouth as directed.    RED YEAST RICE 600 MG TABS    Take 1 tablet by mouth daily.   Modified Medications   No medications on file  Discontinued Medications   CLOBETASOL OINTMENT (TEMOVATE) 0.05 %    Apply 1 application  topically. Apply twice daily    Review of Systems  Constitutional: Negative for activity change, appetite change, chills, diaphoresis, fatigue, fever and unexpected weight change.  HENT: Positive for hearing loss (bilateral aids) and tinnitus. Negative for congestion, ear discharge, ear pain, postnasal drip, rhinorrhea, sore throat, trouble swallowing and voice change.   Eyes: Positive for visual disturbance (corrective lenses). Negative for pain, redness and itching.  Respiratory: Positive for shortness of breath (mild on exertion). Negative for cough, choking and wheezing.   Cardiovascular: Negative for chest pain, palpitations and leg swelling.       Hx pericarditis  Gastrointestinal: Negative for abdominal distention, abdominal pain, constipation, diarrhea and nausea.       Hx hemorrhoids  Endocrine: Negative for cold intolerance, heat intolerance, polydipsia, polyphagia and polyuria.       Hypothyroid  Genitourinary: Negative for difficulty urinating, dysuria, flank pain, frequency, hematuria, pelvic pain, urgency and vaginal discharge.       Perineal irritation--using clobetasol  Musculoskeletal: Negative for arthralgias, back pain, gait problem, myalgias, neck pain and neck stiffness.  Skin: Negative for color change, pallor and rash.  Allergic/Immunologic: Negative.   Neurological: Negative for dizziness, tremors, seizures, syncope, weakness, numbness and headaches.  Hematological: Negative for adenopathy.  Does not bruise/bleed easily.  Psychiatric/Behavioral: Negative for agitation, behavioral problems, confusion, dysphoric mood, hallucinations, sleep disturbance and suicidal ideas. The patient is not nervous/anxious and is not hyperactive.     Vitals:   06/07/16 0934  BP: 112/72  Pulse: 67  Temp: 97.4 F (36.3 C)  TempSrc: Oral  SpO2: 94%  Weight: 156 lb (70.8 kg)  Height: '5\' 5"'$  (1.651 m)   Body mass index is 25.96 kg/m. Wt Readings from Last 3 Encounters:  06/07/16  156 lb (70.8 kg)  05/24/16 156 lb (70.8 kg)  05/03/16 154 lb (69.9 kg)      Physical Exam  Constitutional: She is oriented to person, place, and time. She appears well-developed and well-nourished. No distress.  HENT:  Right Ear: External ear normal.  Left Ear: External ear normal.  Nose: Nose normal.  Mouth/Throat: Oropharynx is clear and moist. No oropharyngeal exudate.  Eyes: Conjunctivae and EOM are normal. Pupils are equal, round, and reactive to light. No scleral icterus.  Neck: No JVD present. No tracheal deviation present. No thyromegaly present.  Cardiovascular: Normal rate, regular rhythm, normal heart sounds and intact distal pulses.  Exam reveals no gallop and no friction rub.   No murmur heard. Pulmonary/Chest: Effort normal. No respiratory distress. She has no wheezes. She has no rales. She exhibits no tenderness.  Abdominal: She exhibits no distension and no mass. There is no tenderness.  Musculoskeletal: Normal range of motion. She exhibits no edema or tenderness.  Lymphadenopathy:    She has no cervical adenopathy.  Neurological: She is alert and oriented to person, place, and time. No cranial nerve deficit. Coordination normal.  Skin: No rash noted. She is not diaphoretic. No erythema. No pallor.  Psychiatric: She has a normal mood and affect. Her behavior is normal. Judgment and thought content normal.    Labs reviewed: Lab Summary Latest Ref Rng & Units 05/30/2016 11/23/2015 11/12/2014 10/08/2014  Hemoglobin 11.7 - 15.5 g/dL 13.2 12.0 11.1(L) 11.4(A)  Hematocrit 35.0 - 45.0 % 39.6 35.5 34.8(L) 35.1(A)  White count 3.8 - 10.8 K/uL 8.7 9.7 9.0 7.6  Platelet count 140 - 400 K/uL 313 267 295 (None)  Sodium 135 - 146 mmol/L 139 (None) (None) (None)  Potassium 3.5 - 5.3 mmol/L 4.0 (None) (None) (None)  Calcium 8.6 - 10.4 mg/dL 9.0 (None) (None) (None)  Phosphorus - (None) (None) (None) (None)  Creatinine 0.60 - 0.93 mg/dL 0.65 (None) (None) (None)  AST 10 - 35 U/L 15  (None) (None) (None)  Alk Phos 33 - 130 U/L 64 (None) (None) (None)  Bilirubin 0.2 - 1.2 mg/dL 0.6 (None) (None) (None)  Glucose 65 - 99 mg/dL 86 (None) (None) (None)  Cholesterol - (None) (None) (None) (None)  HDL cholesterol - (None) (None) (None) (None)  Triglycerides - (None) (None) (None) (None)  LDL Direct - (None) (None) (None) (None)  LDL Calc - (None) (None) (None) (None)  Total protein 6.1 - 8.1 g/dL 6.6 (None) (None) (None)  Albumin 3.6 - 5.1 g/dL 4.1 (None) (None) (None)  Some recent data might be hidden   Lab Results  Component Value Date   TSH 3.99 05/30/2016   TSH 2.99 11/23/2015   TSH 2.762 10/08/2014   T4TOTAL 6.7 02/13/2014   Lab Results  Component Value Date   BUN 11 05/30/2016   BUN 17 02/13/2014   BUN 15 04/18/2013   05/19/12 MRI brain: normal for age. Mild cerebral atrophy and SVD.  Assessment/Plan  1. Balance disorder unchanged  2. Memory deficit  Mild. Recommend MMSE next visit.  3. Anemia, unspecified type resolved  4. Hypothyroidism, unspecified type compensated  5. Weakness of left leg Not evident on today's exam. Able to get up quickly and walk without appliance.

## 2016-06-24 ENCOUNTER — Ambulatory Visit
Admission: RE | Admit: 2016-06-24 | Discharge: 2016-06-24 | Disposition: A | Discharge status: Home / Self Care | Payer: PPO | Source: Ambulatory Visit | Attending: Internal Medicine | Admitting: Internal Medicine

## 2016-06-24 DIAGNOSIS — R413 Other amnesia: Secondary | ICD-10-CM

## 2016-06-24 DIAGNOSIS — R2689 Other abnormalities of gait and mobility: Secondary | ICD-10-CM

## 2016-06-24 DIAGNOSIS — R29898 Other symptoms and signs involving the musculoskeletal system: Secondary | ICD-10-CM

## 2016-06-24 DIAGNOSIS — R531 Weakness: Secondary | ICD-10-CM | POA: Diagnosis not present

## 2016-06-24 MED ORDER — GADOBENATE DIMEGLUMINE 529 MG/ML IV SOLN
14.0000 mL | Freq: Once | INTRAVENOUS | Status: AC | PRN
  Administered 2016-06-24: 14 mL via INTRAVENOUS

## 2016-07-13 DIAGNOSIS — Z961 Presence of intraocular lens: Secondary | ICD-10-CM | POA: Diagnosis not present

## 2016-07-13 DIAGNOSIS — H524 Presbyopia: Secondary | ICD-10-CM | POA: Diagnosis not present

## 2016-07-19 ENCOUNTER — Encounter: Payer: Self-pay | Admitting: Internal Medicine

## 2016-07-22 ENCOUNTER — Encounter: Payer: Self-pay | Admitting: Internal Medicine

## 2016-08-05 DIAGNOSIS — L292 Pruritus vulvae: Secondary | ICD-10-CM | POA: Diagnosis not present

## 2016-08-08 ENCOUNTER — Encounter: Payer: Self-pay | Admitting: Internal Medicine

## 2016-08-16 ENCOUNTER — Encounter: Payer: PPO | Admitting: Internal Medicine

## 2016-08-17 ENCOUNTER — Encounter: Payer: Self-pay | Admitting: Internal Medicine

## 2016-09-02 DIAGNOSIS — L292 Pruritus vulvae: Secondary | ICD-10-CM | POA: Diagnosis not present

## 2016-10-11 ENCOUNTER — Encounter: Payer: PPO | Admitting: Internal Medicine

## 2016-10-12 ENCOUNTER — Encounter: Payer: Self-pay | Admitting: Internal Medicine

## 2016-10-12 ENCOUNTER — Non-Acute Institutional Stay: Payer: PPO | Admitting: Internal Medicine

## 2016-10-12 VITALS — BP 114/62 | HR 60 | Temp 97.5°F | Resp 18 | Ht 65.0 in | Wt 151.8 lb

## 2016-10-12 DIAGNOSIS — E782 Mixed hyperlipidemia: Secondary | ICD-10-CM

## 2016-10-12 DIAGNOSIS — L989 Disorder of the skin and subcutaneous tissue, unspecified: Secondary | ICD-10-CM | POA: Diagnosis not present

## 2016-10-12 DIAGNOSIS — H9193 Unspecified hearing loss, bilateral: Secondary | ICD-10-CM

## 2016-10-12 DIAGNOSIS — R200 Anesthesia of skin: Secondary | ICD-10-CM

## 2016-10-12 DIAGNOSIS — E039 Hypothyroidism, unspecified: Secondary | ICD-10-CM

## 2016-10-12 DIAGNOSIS — H6123 Impacted cerumen, bilateral: Secondary | ICD-10-CM | POA: Diagnosis not present

## 2016-10-12 DIAGNOSIS — D509 Iron deficiency anemia, unspecified: Secondary | ICD-10-CM | POA: Diagnosis not present

## 2016-10-12 DIAGNOSIS — Z1211 Encounter for screening for malignant neoplasm of colon: Secondary | ICD-10-CM | POA: Diagnosis not present

## 2016-10-12 NOTE — Progress Notes (Addendum)
[facility]  Provider: Blanchie Serve MD   Location:  [place] of Service:  Clinic (12)  PCP: Blanchie Serve, MD Patient Care Team: Blanchie Serve, MD as PCP - General (Internal Medicine) Ronald Lobo, MD as Consulting Physician (Gastroenterology) Katy Apo, MD as Consulting Physician (Ophthalmology) Martinique, Amy, MD as Consulting Physician (Dermatology)  Extended Emergency Contact Information Primary Emergency Contact: Jeremy Johann Address: [address] [place] [address] [place] of [place] Phone: [phone] Mobile Phone: [phone] Relation: Son Secondary Emergency Contact: Snavely,Celia Address: [address]          [place], [place] [address] Johnnette Litter of [place] Phone: [phone] Mobile Phone: [phone] Relation: Friend   Goals of Care: Advanced Directive information Advanced Directives 06/07/2016  Does Patient Have a Medical Advance Directive? Yes  Type of Paramedic of [place];Living will  Copy of [place] in Chart? Yes     Chief Complaint  Patient presents with  . Medical Management of Chronic Issues    6 month follow. Concerns about her skin tags and wax build up in her ears.   . Medication Refill    No refills needed at this time.  . Other    colon cancer screen    HPI: Patient is a 76 y.o. female seen today for routine visit. She is concerned about wax build up in her ears. Went for testing of hearing aid and was told she has wax. Denies earache or pain. Denies ear discharge.  She has irritating skin tag on her stomach, for a moth, denies pain or itching. No tags elsewhere.  No other concerns.  Iron deficiency anemia- normal iron level but low ferritin. Pt has stopped her feso4. She feels weak and gets fatigued easily  Hypothyroidism- takes levothyroxine alternating 125 mcg and 112 mcg, fatigue present, no other  concerns  Hyperlipidemia- on fishoil and red yeast  Numbness- intermittent to her toes, metanx helps  Due maintenance- due for colonoscopy screening. No family history of colon cancer. Had colonoscopy long time ago- does not remember when, 1 polyp removed and was benign. Occasional straining and has noticed blood in stool few times. Has bowel movement on daily basis. Does not want colonoscopy.    Past Medical History:  Diagnosis Date  . Anemia 12/2015  . Cataract   . Hearing loss 05/03/2016  . High cholesterol   . Osteopenia   . Pericarditis 01/1999  . Pneumothorax 01/1999  . Thyroid disorder   . Vitamin B 12 deficiency    Past Surgical History:  Procedure Laterality Date  . birthmark      removed,left leg  . CATARACT EXTRACTION  2017  . [place]?    reports that she has never smoked. She has never used smokeless tobacco. She reports that she drinks about 1.8 oz of alcohol per week . She reports that she does not use drugs. Social History   Social History  . Marital status: Divorced    Spouse name: N/A  . Number of children: 1  . Years of education: college   Occupational History  . retired    Social History Main Topics  . Smoking status: Never Smoker  . Smokeless tobacco: Never Used  . Alcohol use 1.8 oz/week    3 Glasses of wine per week  . Drug use: No  . Sexual activity: Not on  file   Other Topics Concern  . Not on file   Social History Narrative   Moved to [facility] 01/14/2015   Do you drink/eat things with caffeine?  yes   Marital status?      divorced                               Do you live in a house, apartment, assisted living, condo, trailer, etc.?  Retirement community   Is it one or more stories? Yes, 3   How many persons live in your home?  1   Do you have any pets in your home? (please list) 0   Current or past profession:  Librarian   Do you exercise?     yes                                 Type & how  often? Walk-varies   Do you have a living will? yes   Do you have a DNR form?         no                         If not, do you want to discuss one? yes   Do you have signed POA/HPOA for forms? yes       Functional Status Survey:    Family History  Problem Relation Age of Onset  . Diabetes Mother   . Asthma Mother   . Ataxia Father   . Cancer - Cervical Sister   . Thyroid disease Sister   . Diabetes Other   . Diabetes Maternal Grandmother   . Arthritis Paternal Grandmother   . Colon cancer Neg Hx     Health Maintenance  Topic Date Due  . COLONOSCOPY  02/09/2015  . INFLUENZA VACCINE  10/05/2016  . TETANUS/TDAP  06/03/2018  . DEXA SCAN  Completed  . PNA vac Low Risk Adult  Completed    Allergies  Allergen Reactions  . Atorvastatin     Muscle weakness  . Rosuvastatin     Muscle weaknes    Outpatient Encounter Prescriptions as of 10/12/2016  Medication Sig  . amoxicillin (AMOXIL) 500 MG capsule Take 4 tablets prior to dental  . cholecalciferol (VITAMIN D) 1000 UNITS tablet Take 1,000 Units by mouth daily.  . clobetasol ointment (TEMOVATE) 1.60 % Apply 1 application topically 2 (two) times daily.  Marland Kitchen L-Methylfolate-Algae-B12-B6 (METANX) 3-90.314-2-35 MG CAPS Take 1 tablet by mouth 2 (two) times daily.  Marland Kitchen levothyroxine (SYNTHROID, LEVOTHROID) 112 MCG tablet Take 1 tablet of 112 mcg strength on Tues, Thurs, Sat, and Sun. Alternate with 125 mcg tablets on other days of week.  . levothyroxine (SYNTHROID, LEVOTHROID) 125 MCG tablet Take 1 tablet of 125 mcg strength on Mon, Wed and Fri. Alternate days with 112 mcg dose on other days of week.  . Red Yeast Rice 600 MG TABS Take 1 tablet by mouth daily.   . [DISCONTINUED] fish oil-omega-3 fatty acids 1000 MG capsule Take 2 g by mouth daily.  . Ferrous Sulfate Dried (SLOW IRON PO) Take by mouth. Take one tablet daily for iron  . [DISCONTINUED] Misc Natural Products (OSTEO BI-FLEX JOINT SHIELD PO) Take 2 tablets by mouth as directed.     No facility-administered encounter medications on file as of 10/12/2016.  Review of Systems  Constitutional: Positive for fatigue. Negative for appetite change, chills, diaphoresis and fever.  HENT: Positive for hearing loss. Negative for congestion, mouth sores, rhinorrhea, sinus pressure, sore throat and trouble swallowing.   Eyes: Negative for visual disturbance.       Reading glasses, s/p cataract surgery  Respiratory: Negative for cough and shortness of breath.   Cardiovascular: Negative for chest pain, palpitations and leg swelling.  Gastrointestinal: Negative for abdominal pain, constipation, diarrhea, nausea, rectal pain and vomiting.  Genitourinary: Negative for dysuria, frequency and hematuria.  Musculoskeletal: Negative for arthralgias, back pain and gait problem.       No fall reported  Skin: Negative for rash and wound.  Neurological: Positive for numbness. Negative for dizziness, seizures, syncope and light-headedness.  Hematological: Does not bruise/bleed easily.  Psychiatric/Behavioral: Negative for behavioral problems, confusion and sleep disturbance. The patient is not nervous/anxious.     Vitals:   10/12/16 0924  BP: 114/62  Pulse: 60  Resp: 18  Temp: (!) 97.5 F (36.4 C)  TempSrc: Oral  SpO2: 97%  Weight: 151 lb 12.8 oz (68.9 kg)  Height: '5\' 5"'$  (1.651 m)   Body mass index is 25.26 kg/m. Physical Exam  Constitutional: She is oriented to person, place, and time. She appears well-developed and well-nourished. No distress.  HENT:  Head: Normocephalic and atraumatic.  Mouth/Throat: Oropharynx is clear and moist. No oropharyngeal exudate.  Hearing aid. Impacted cerumen to both ears  Eyes: Pupils are equal, round, and reactive to light. Conjunctivae and EOM are normal. Right eye exhibits no discharge. Left eye exhibits no discharge. No scleral icterus.  Neck: Normal range of motion. Neck supple. No JVD present. No thyromegaly present.  Cardiovascular:  Normal rate, regular rhythm and normal heart sounds.   No murmur heard. Pulmonary/Chest: Effort normal and breath sounds normal. No respiratory distress. She has no wheezes. She has no rales.  Abdominal: Soft. Bowel sounds are normal. She exhibits no distension. There is no tenderness. There is no rebound and no guarding.  Musculoskeletal: She exhibits no edema.  Can move all 4, no assistive device uses  Lymphadenopathy:    She has no cervical adenopathy.  Neurological: She is alert and oriented to person, place, and time.  Skin: Skin is warm and dry. No rash noted. She is not diaphoretic. No pallor.  Psychiatric: She has a normal mood and affect. Her behavior is normal. Thought content normal.    Labs reviewed: Basic Metabolic Panel:  Recent Labs  05/30/16 0801  NA 139  K 4.0  CL 105  CO2 27  GLUCOSE 86  BUN 11  CREATININE 0.65  CALCIUM 9.0   Liver Function Tests:  Recent Labs  05/30/16 0801  AST 15  ALT 15  ALKPHOS 64  BILITOT 0.6  PROT 6.6  ALBUMIN 4.1   No results for input(s): LIPASE, AMYLASE in the last 8760 hours. No results for input(s): AMMONIA in the last 8760 hours. CBC:  Recent Labs  11/23/15 1111 05/30/16 0801  WBC 9.7 8.7  NEUTROABS 7,372 5,046  HGB 12.0 13.2  HCT 35.5 39.6  MCV 86.2 95.7  PLT 267 313   Cardiac Enzymes: No results for input(s): CKTOTAL, CKMB, CKMBINDEX, TROPONINI in the last 8760 hours. BNP: Invalid input(s): POCBNP No results found for: HGBA1C Lab Results  Component Value Date   TSH 3.99 05/30/2016   Lab Results  Component Value Date   VITAMINB12 >2000 (H) 11/23/2015   No results found for: FOLATE Lab Results  Component Value Date   IRON 85 05/30/2016   TIBC 327 05/30/2016   FERRITIN 18 (L) 05/30/2016    Lipid Panel: No results for input(s): CHOL, HDL, LDLCALC, TRIG, CHOLHDL, LDLDIRECT in the last 8760 hours. No results found for: HGBA1C  Procedures since last visit: No results  found.  Assessment/Plan  1. Hypothyroidism, unspecified type Reviewed tsh from 3/18. Continue levothyroxine - TSH; Future - CBC (no diff); Future  2. Bilateral hearing loss, unspecified hearing loss type Has hearing aid. Cerumen to both ears. Debrox ear drop bid x 5 days- has drops at home. Follow with ear lavage, will get it from [facility].   3. HYPERLIPIDEMIA No recent lipid panel. Check lipid pane. Continue fish oil and rice yeast supplement  4. Numbness Resolved with metanx  5. Iron deficiency anemia, unspecified iron deficiency anemia type Start ferrous sulfate, check ferritin level in 3 months - Iron and TIBC; Future - TSH; Future - Ferritin; Future  6. Bilateral impacted cerumen Debrox bid x 5 days f/b ear lavage  7. Skin lesion Pt concerned about raised bump to her abdominal wall, not painful, not itching, been there x 1 month, wants it removed. Advised monitoring.    Next appointment: 6 months  Communication: reviewed care plan with patient    I spent 30 minutes in total face-to-face time with the patient, more than 50% of which was spent in counseling and coordination of care, reviewing test results, reviewing medication and discussing or reviewing the diagnosis with patient.    Addendum- colon cancer screening- does not want colonoscopy, agrees to FOBT, FOBT kit for colon cancer screen to be provided.    Blanchie Serve, MD Internal Medicine [facility] [address] [place], [place] [address] Cell Phone (Monday-Friday 8 am - 5 pm): [phone] On Call: [phone] and follow prompts after 5 pm and on weekends Office Phone: [phone] Office Fax: [phone]

## 2016-10-13 ENCOUNTER — Encounter: Payer: PPO | Admitting: Internal Medicine

## 2016-10-17 ENCOUNTER — Other Ambulatory Visit: Payer: Self-pay

## 2016-10-17 DIAGNOSIS — Z1211 Encounter for screening for malignant neoplasm of colon: Secondary | ICD-10-CM

## 2016-11-04 ENCOUNTER — Telehealth: Payer: Self-pay | Admitting: Internal Medicine

## 2016-11-04 NOTE — Telephone Encounter (Signed)
I left a message asking the pt to schedule AWV-S at [facility] clinic (Last AWV 08/19/14). VDM (DD)

## 2016-11-08 ENCOUNTER — Other Ambulatory Visit: Payer: Self-pay

## 2016-11-08 MED ORDER — LEVOTHYROXINE SODIUM 112 MCG PO TABS: ORAL_TABLET | ORAL | 3 refills | Status: DC

## 2016-11-10 NOTE — Telephone Encounter (Signed)
I spoke with the patient to schedule AWV-S (Last AWV 08/19/14), but the dates 9/7 and 9/12 don't work for her.  I told her I would call back once I have new dates for nurse at [facility] clinic. VDM (DD)

## 2016-12-09 ENCOUNTER — Encounter: Payer: Self-pay | Admitting: Internal Medicine

## 2016-12-23 ENCOUNTER — Telehealth: Payer: Self-pay

## 2016-12-23 NOTE — Telephone Encounter (Signed)
Patient returned Maudie Mercury Dance/CMA for Dinah/Dr.Pandey to indicate that she was not provided a stool kit by [employer].   Patient states she was told that [place] would bring the stool kit to her room for they were unable to locate them at the time the provider instructed for her to complete the stool kit yet it was never bought to her room.  Please contact patient to inform her of the status of getting a stool kit.

## 2016-12-23 NOTE — Telephone Encounter (Signed)
Message left advising patient that I will personally deliver kit to her on Monday 12/26/16. Jessica [name] advised she had given patient the kit at her visit, so I'm not sure what happened. Regardless, I will make sure she gets it. Thanks!

## 2016-12-26 NOTE — Telephone Encounter (Signed)
Stool kit delivered to patient. Went over instructions with patient and she verbalized understanding. Will call the office if she has any questions later.

## 2016-12-29 ENCOUNTER — Encounter: Payer: Self-pay | Admitting: Internal Medicine

## 2016-12-30 ENCOUNTER — Encounter: Payer: Self-pay | Admitting: Internal Medicine

## 2016-12-30 NOTE — Telephone Encounter (Signed)
I left a message for the patient explaining that the only other available times on 01/06/17 are 10:00 or 3:15.  If neither of those times work for her, then I'll get her rescheduled once I know when Clarise Cruz will be back at [facility]. VDM (DD)  I left her my direct number to call back at [phone].

## 2017-01-03 NOTE — Telephone Encounter (Signed)
I left another message asking the patient if 3:15 will work for her on 01/06/17 at [facility] clinic. VDM (DD)

## 2017-01-16 ENCOUNTER — Non-Acute Institutional Stay: Payer: Self-pay

## 2017-01-16 DIAGNOSIS — D509 Iron deficiency anemia, unspecified: Secondary | ICD-10-CM | POA: Diagnosis not present

## 2017-01-16 DIAGNOSIS — E782 Mixed hyperlipidemia: Secondary | ICD-10-CM | POA: Diagnosis not present

## 2017-01-16 DIAGNOSIS — Z1211 Encounter for screening for malignant neoplasm of colon: Secondary | ICD-10-CM | POA: Diagnosis not present

## 2017-01-16 DIAGNOSIS — E039 Hypothyroidism, unspecified: Secondary | ICD-10-CM | POA: Diagnosis not present

## 2017-01-23 ENCOUNTER — Telehealth: Payer: Self-pay | Admitting: Internal Medicine

## 2017-01-23 NOTE — Telephone Encounter (Signed)
Left msg asking pt to schedule AWV-S on 02/10/17 at [facility] clinic. VDM (DD)

## 2017-01-24 LAB — FECAL GLOBIN BY IMMUNOCHEM,MEDICARE

## 2017-01-24 LAB — CBC
HCT: 38.2 % (ref 35.0–45.0)
HEMOGLOBIN: 13.1 g/dL (ref 11.7–15.5)
MCH: 32 pg (ref 27.0–33.0)
MCHC: 34.3 g/dL (ref 32.0–36.0)
MCV: 93.4 fL (ref 80.0–100.0)
MPV: 11.1 fL (ref 7.5–12.5)
Platelets: 275 10*3/uL (ref 140–400)
RBC: 4.09 10*6/uL (ref 3.80–5.10)
RDW: 12.7 % (ref 11.0–15.0)
WBC: 7.5 10*3/uL (ref 3.8–10.8)

## 2017-01-24 LAB — LIPID PANEL
Cholesterol: 248 mg/dL — ABNORMAL HIGH (ref <200)
HDL: 57 mg/dL (ref >50)
LDL Cholesterol (Calc): 165 mg/dL (calc) — ABNORMAL HIGH
NON-HDL CHOLESTEROL (CALC): 191 mg/dL — AB (ref <130)
TRIGLYCERIDES: 127 mg/dL (ref <150)
Total CHOL/HDL Ratio: 4.4 (calc) (ref <5.0)

## 2017-01-24 LAB — TSH: TSH: 2.45 m[IU]/L (ref 0.40–4.50)

## 2017-01-24 LAB — TEST AUTHORIZATION

## 2017-01-24 LAB — IRON, TOTAL/TOTAL IRON BINDING CAP
%SAT: 24 % (calc) (ref 11–50)
Iron: 78 ug/dL (ref 45–160)
TIBC: 326 ug/dL (ref 250–450)

## 2017-01-24 LAB — FERRITIN: FERRITIN: 30 ng/mL (ref 20–288)

## 2017-02-03 NOTE — Telephone Encounter (Signed)
Patient returned Dee-Dee's call, please call patient on [phone]

## 2017-02-16 DIAGNOSIS — Z961 Presence of intraocular lens: Secondary | ICD-10-CM | POA: Diagnosis not present

## 2017-02-16 DIAGNOSIS — H52203 Unspecified astigmatism, bilateral: Secondary | ICD-10-CM | POA: Diagnosis not present

## 2017-03-01 ENCOUNTER — Telehealth: Payer: Self-pay | Admitting: *Deleted

## 2017-03-01 NOTE — Telephone Encounter (Signed)
Spoke with patient about another matter and she mentioned that she was having some arthritic pain in her hip. She asked if she should be seen sooner than her scheduled February appt. She said she was taking 400mg  ibuprofen BID which was helping the pain and she denied injury. I advised that if the ibuprofen was helping and she wasn't noticing herself needing more, then she should be fine to wait. However, if it got worse or no better with meds, then to come in sooner. Last CMP was WNL.

## 2017-03-09 ENCOUNTER — Telehealth: Payer: Self-pay

## 2017-03-09 NOTE — Telephone Encounter (Signed)
Patient called to ask what she is supposed to do with the stool collection kit she received. She stated that is unsure of where she is supposed to return the kit to and the physician portion of the forms were not completed.   Please contact patient to make arrangements to complete form and inform patient of where to return kit to.

## 2017-03-09 NOTE — Telephone Encounter (Signed)
Left a message for the message for the patient on where to leave the kit.

## 2017-03-13 DIAGNOSIS — L292 Pruritus vulvae: Secondary | ICD-10-CM | POA: Diagnosis not present

## 2017-03-13 DIAGNOSIS — L900 Lichen sclerosus et atrophicus: Secondary | ICD-10-CM | POA: Diagnosis not present

## 2017-03-15 ENCOUNTER — Other Ambulatory Visit: Payer: Self-pay | Admitting: *Deleted

## 2017-03-15 MED ORDER — LEVOTHYROXINE SODIUM 125 MCG PO TABS: ORAL_TABLET | ORAL | 3 refills | Status: DC

## 2017-03-15 MED ORDER — LEVOTHYROXINE SODIUM 112 MCG PO TABS: ORAL_TABLET | ORAL | 3 refills | Status: DC

## 2017-03-15 NOTE — Telephone Encounter (Signed)
[facility] 

## 2017-03-29 ENCOUNTER — Non-Acute Institutional Stay: Payer: Medicare Other

## 2017-03-29 VITALS — BP 130/64 | HR 58 | Ht 65.0 in | Wt 152.0 lb

## 2017-03-29 DIAGNOSIS — Z0000 Encounter for general adult medical examination without abnormal findings: Secondary | ICD-10-CM | POA: Diagnosis not present

## 2017-03-29 NOTE — Progress Notes (Signed)
Subjective:   Jessica [name] is a 77 y.o. female who presents for Medicare Annual (Subsequent) preventive examination at [facility]  Last AWV-08/19/2014       Objective:     Vitals: BP 130/64 (BP Location: Left Arm, Patient Position: Sitting)   Pulse (!) 58   Ht 5\' 5"  (1.651 m)   Wt 152 lb (68.9 kg)   SpO2 97%   BMI 25.29 kg/m   Body mass index is 25.29 kg/m.  Advanced Directives 03/29/2017 06/07/2016 05/24/2016 05/03/2016 08/19/2014  Does Patient Have a Medical Advance Directive? Yes Yes Yes Yes Yes  Type of Paramedic of [place];Living will [place];Living will [place];Living will -  Does patient want to make changes to medical advance directive? No - Patient declined - - - -  Copy of [place] in Chart? Yes Yes Yes Yes Yes    Tobacco Social History   Tobacco Use  Smoking Status Never Smoker  Smokeless Tobacco Never Used     Counseling given: Not Answered   Clinical Intake:  Pre-visit preparation completed: No  Pain : No/denies pain     Nutritional Risks: None Diabetes: No  How often do you need to have someone help you when you read instructions, pamphlets, or other written materials from your doctor or pharmacy?: 1 - Never What is the last grade level you completed in school?: Postgraduate  Interpreter Needed?: No  Information entered by :: Tyson Dense, RN  Past Medical History:  Diagnosis Date  . Anemia 12/2015  . Cataract   . Hearing loss 05/03/2016  . High cholesterol   . Osteopenia   . Pericarditis 01/1999  . Pneumothorax 01/1999  . Thyroid disorder   . Vitamin B 12 deficiency    Past Surgical History:  Procedure Laterality Date  . birthmark      removed,left leg  . CATARACT EXTRACTION  2017  . [place]?   Family History  Problem Relation Age of Onset  .  Diabetes Mother   . Asthma Mother   . Ataxia Father   . Cancer - Cervical Sister   . Thyroid disease Sister   . Diabetes Other   . Diabetes Maternal Grandmother   . Arthritis Paternal Grandmother   . Colon cancer Neg Hx    Social History   Socioeconomic History  . Marital status: Divorced    Spouse name: None  . Number of children: 1  . Years of education: college  . Highest education level: None  Social Needs  . Financial resource strain: Not hard at all  . Food insecurity - worry: Never true  . Food insecurity - inability: Never true  . Transportation needs - medical: No  . Transportation needs - non-medical: No  Occupational History  . Occupation: retired  Tobacco Use  . Smoking status: Never Smoker  . Smokeless tobacco: Never Used  Substance and Sexual Activity  . Alcohol use: Yes    Alcohol/week: 1.8 oz    Types: 3 Glasses of wine per week  . Drug use: No  . Sexual activity: None  Other Topics Concern  . None  Social History Narrative   Moved to [facility] 01/14/2015   Do you drink/eat things with caffeine?  yes   Marital status?      divorced  Do you live in a house, apartment, assisted living, condo, trailer, etc.?  Retirement community   Is it one or more stories? Yes, 3   How many persons live in your home?  1   Do you have any pets in your home? (please list) 0   Current or past profession:  Librarian   Do you exercise?     yes                                 Type & how often? Walk-varies   Do you have a living will? yes   Do you have a DNR form?         no                         If not, do you want to discuss one? yes   Do you have signed POA/HPOA for forms? yes    Outpatient Encounter Medications as of 03/29/2017  Medication Sig  . cholecalciferol (VITAMIN D) 1000 UNITS tablet Take 1,000 Units by mouth daily.  . clobetasol ointment (TEMOVATE) 6.01 % Apply 1 application topically 2 (two) times daily.  Marland Kitchen FERROUS  SULFATE PO Take 1 tablet by mouth daily.  Marland Kitchen L-Methylfolate-Algae-B12-B6 (METANX) 3-90.314-2-35 MG CAPS Take 1 tablet by mouth 2 (two) times daily.  Marland Kitchen levothyroxine (SYNTHROID, LEVOTHROID) 112 MCG tablet Take 1 tablet of 112 mcg strength on Tues, Thurs, Sat, and Sun. Alternate with 125 mcg tablets on other days of week.  . levothyroxine (SYNTHROID, LEVOTHROID) 125 MCG tablet Take 1 tablet of 125 mcg strength on Mon, Wed and Fri. Alternate days with 112 mcg dose on other days of week.  . Omega-3 Fatty Acids (FISH OIL) 1000 MG CAPS Take by mouth.  . Red Yeast Rice 600 MG TABS Take 1 tablet by mouth daily.   . [DISCONTINUED] amoxicillin (AMOXIL) 500 MG capsule Take 4 tablets prior to dental  . [DISCONTINUED] Ferrous Sulfate Dried (SLOW IRON PO) Take by mouth. Take one tablet daily for iron   No facility-administered encounter medications on file as of 03/29/2017.     Activities of Daily Living In your present state of health, do you have any difficulty performing the following activities: 03/29/2017  Hearing? N  Vision? N  Difficulty concentrating or making decisions? N  Walking or climbing stairs? N  Dressing or bathing? N  Doing errands, shopping? N  Preparing Food and eating ? N  Using the Toilet? N  In the past six months, have you accidently leaked urine? Y  Do you have problems with loss of bowel control? Y  Managing your Medications? N  Managing your Finances? N  Housekeeping or managing your Housekeeping? N  Some recent data might be hidden    Patient Care Team: Blanchie Serve, MD as PCP - General (Internal Medicine) Ronald Lobo, MD as Consulting Physician (Gastroenterology) Katy Apo, MD as Consulting Physician (Ophthalmology) Martinique, Amy, MD as Consulting Physician (Dermatology)    Assessment:   This is a routine wellness examination for [facility].  Exercise Activities and Dietary recommendations Current Exercise Habits: Structured exercise class, Type of exercise:  Other - see comments, Time (Minutes): 30, Frequency (Times/Week): 4, Weekly Exercise (Minutes/Week): 120, Intensity: Mild, Exercise limited by: None identified  Goals    . Weight (lb) < 145 lb (65.8 kg)     Pt would like to exercise and work on portion  control       Fall Risk Fall Risk  03/29/2017 11/23/2015 11/18/2015 11/02/2015 01/05/2015  Falls in the past year? No No No No Yes  Number falls in past yr: - - - - 2 or more  Injury with Fall? - - - - No  Comment - - - - -   Is the patient's home free of loose throw rugs in walkways, pet beds, electrical cords, etc?   yes      Grab bars in the bathroom? yes      Handrails on the stairs?   yes      Adequate lighting?   yes  Timed Get Up and Go performed: 18 seconds, fall risk  Depression Screen PHQ 2/9 Scores 03/29/2017 11/23/2015 11/18/2015 11/02/2015  PHQ - 2 Score 0 0 0 0     Cognitive Function MMSE - Mini Mental State Exam 03/29/2017  Orientation to time 5  Orientation to Place 5  Registration 3  Attention/ Calculation 5  Recall 3  Language- name 2 objects 2  Language- repeat 1  Language- follow 3 step command 3  Language- read & follow direction 1  Write a sentence 1  Copy design 1  Total score 30        Immunization History  Administered Date(s) Administered  . Influenza Whole 12/29/2006  . Influenza,inj,Quad PF,6+ Mos 12/04/2013, 11/12/2014, 11/18/2015  . Influenza-Unspecified 12/02/2011, 11/15/2012, 12/02/2016  . PPD Test 01/05/2015  . Pneumococcal Conjugate-13 08/19/2014  . Pneumococcal Polysaccharide-23 11/18/2015  . Td 06/02/2008  . Zoster 03/08/2011  . Zoster Recombinat (Shingrix) 05/02/2016    Qualifies for Shingles Vaccine? No, up to date  Screening Tests Health Maintenance  Topic Date Due  . COLONOSCOPY  02/09/2015  . TETANUS/TDAP  06/03/2018  . INFLUENZA VACCINE  Completed  . DEXA SCAN  Completed  . PNA vac Low Risk Adult  Completed    Cancer Screenings: Lung: Low Dose CT Chest recommended  if Age 5-80 years, 30 pack-year currently smoking OR have quit w/in 15years. Patient does not qualify. Breast:  Up to date on Mammogram? Yes   Up to date of Bone Density/Dexa? Yes Colorectal: up to date  Additional Screenings:  Hepatitis B/HIV/Syphillis: declined Hepatitis C Screening: declined     Plan:    I have personally reviewed and addressed the Medicare Annual Wellness questionnaire and have noted the following in the patient's chart:  A. Medical and social history B. Use of alcohol, tobacco or illicit drugs  C. Current medications and supplements D. Functional ability and status E.  Nutritional status F.  Physical activity G. Advance directives H. List of other physicians I.  Hospitalizations, surgeries, and ER visits in previous 12 months J.  [place] to include hearing, vision, cognitive, depression L. Referrals and appointments - none  In addition, I have reviewed and discussed with patient certain preventive protocols, quality metrics, and best practice recommendations. A written personalized care plan for preventive services as well as general preventive health recommendations were provided to patient.  See attached scanned questionnaire for additional information.   Signed,   Tyson Dense, RN Nurse Health Advisor   Quick Notes   Health Maintenance: Up to date-waiting for cologuard results.     Abnormal Screen: MMSE 30/30, passed clock drawing     Patient Concerns: Waiting for doctor to approve refill of Metanx-she is almost out     Nurse Concerns: none

## 2017-03-29 NOTE — Patient Instructions (Signed)
Jessica [name] , Thank you for taking time to come for your Medicare Wellness Visit. I appreciate your ongoing commitment to your health goals. Please review the following plan we discussed and let me know if I can assist you in the future.   Screening recommendations/referrals: Colonoscopy up to date, excluded at age 76 Mammogram due, excluded at age 30 Bone Density up to date Recommended yearly ophthalmology/optometry visit for glaucoma screening and checkup Recommended yearly dental visit for hygiene and checkup  Vaccinations: Influenza vaccine up to date, due 2019 fall season Pneumococcal vaccine up to date Tdap vaccine up to date, due 06/03/2018 Shingles vaccine up to date    Advanced directives: in chart  Conditions/risks identified: none  Next appointment: Dr. Bubba Camp 04/26/2017 @ 9:30am   Preventive Care 65 Years and Older, Female Preventive care refers to lifestyle choices and visits with your health care provider that can promote health and wellness. What does preventive care include?  A yearly physical exam. This is also called an annual well check.  Dental exams once or twice a year.  Routine eye exams. Ask your health care provider how often you should have your eyes checked.  Personal lifestyle choices, including:  Daily care of your teeth and gums.  Regular physical activity.  Eating a healthy diet.  Avoiding tobacco and drug use.  Limiting alcohol use.  Practicing safe sex.  Taking low-dose aspirin every day.  Taking vitamin and mineral supplements as recommended by your health care provider. What happens during an annual well check? The services and screenings done by your health care provider during your annual well check will depend on your age, overall health, lifestyle risk factors, and family history of disease. Counseling  Your health care provider may ask you questions about your:  Alcohol use.  Tobacco use.  Drug use.  Emotional  well-being.  Home and relationship well-being.  Sexual activity.  Eating habits.  History of falls.  Memory and ability to understand (cognition).  Work and work Statistician.  Reproductive health. Screening  You may have the following tests or measurements:  Height, weight, and BMI.  Blood pressure.  Lipid and cholesterol levels. These may be checked every 5 years, or more frequently if you are over 76 years old.  Skin check.  Lung cancer screening. You may have this screening every year starting at age 26 if you have a 30-pack-year history of smoking and currently smoke or have quit within the past 15 years.  Fecal occult blood test (FOBT) of the stool. You may have this test every year starting at age 86.  Flexible sigmoidoscopy or colonoscopy. You may have a sigmoidoscopy every 5 years or a colonoscopy every 10 years starting at age 14.  Hepatitis C blood test.  Hepatitis B blood test.  Sexually transmitted disease (STD) testing.  Diabetes screening. This is done by checking your blood sugar (glucose) after you have not eaten for a while (fasting). You may have this done every 1-3 years.  Bone density scan. This is done to screen for osteoporosis. You may have this done starting at age 56.  Mammogram. This may be done every 1-2 years. Talk to your health care provider about how often you should have regular mammograms. Talk with your health care provider about your test results, treatment options, and if necessary, the need for more tests. Vaccines  Your health care provider may recommend certain vaccines, such as:  Influenza vaccine. This is recommended every year.  Tetanus, diphtheria, and  acellular pertussis (Tdap, Td) vaccine. You may need a Td booster every 10 years.  Zoster vaccine. You may need this after age 39.  Pneumococcal 13-valent conjugate (PCV13) vaccine. One dose is recommended after age 19.  Pneumococcal polysaccharide (PPSV23) vaccine. One  dose is recommended after age 61. Talk to your health care provider about which screenings and vaccines you need and how often you need them. This information is not intended to replace advice given to you by your health care provider. Make sure you discuss any questions you have with your health care provider. Document Released: 03/20/2015 Document Revised: 11/11/2015 Document Reviewed: 12/23/2014 Elsevier Interactive Patient Education  2017 [employer] Prevention in the Home Falls can cause injuries. They can happen to people of all ages. There are many things you can do to make your home safe and to help prevent falls. What can I do on the outside of my home?  Regularly fix the edges of walkways and driveways and fix any cracks.  Remove anything that might make you trip as you walk through a door, such as a raised step or threshold.  Trim any bushes or trees on the path to your home.  Use bright outdoor lighting.  Clear any walking paths of anything that might make someone trip, such as rocks or tools.  Regularly check to see if handrails are loose or broken. Make sure that both sides of any steps have handrails.  Any raised decks and porches should have guardrails on the edges.  Have any leaves, snow, or ice cleared regularly.  Use sand or salt on walking paths during winter.  Clean up any spills in your garage right away. This includes oil or grease spills. What can I do in the bathroom?  Use night lights.  Install grab bars by the toilet and in the tub and shower. Do not use towel bars as grab bars.  Use non-skid mats or decals in the tub or shower.  If you need to sit down in the shower, use a plastic, non-slip stool.  Keep the floor dry. Clean up any water that spills on the floor as soon as it happens.  Remove soap buildup in the tub or shower regularly.  Attach bath mats securely with double-sided non-slip rug tape.  Do not have throw rugs and other  things on the floor that can make you trip. What can I do in the bedroom?  Use night lights.  Make sure that you have a light by your bed that is easy to reach.  Do not use any sheets or blankets that are too big for your bed. They should not hang down onto the floor.  Have a firm chair that has side arms. You can use this for support while you get dressed.  Do not have throw rugs and other things on the floor that can make you trip. What can I do in the kitchen?  Clean up any spills right away.  Avoid walking on wet floors.  Keep items that you use a lot in easy-to-reach places.  If you need to reach something above you, use a strong step stool that has a grab bar.  Keep electrical cords out of the way.  Do not use floor polish or wax that makes floors slippery. If you must use wax, use non-skid floor wax.  Do not have throw rugs and other things on the floor that can make you trip. What can I do with my stairs?  Do not leave any items on the stairs.  Make sure that there are handrails on both sides of the stairs and use them. Fix handrails that are broken or loose. Make sure that handrails are as long as the stairways.  Check any carpeting to make sure that it is firmly attached to the stairs. Fix any carpet that is loose or worn.  Avoid having throw rugs at the top or bottom of the stairs. If you do have throw rugs, attach them to the floor with carpet tape.  Make sure that you have a light switch at the top of the stairs and the bottom of the stairs. If you do not have them, ask someone to add them for you. What else can I do to help prevent falls?  Wear shoes that:  Do not have high heels.  Have rubber bottoms.  Are comfortable and fit you well.  Are closed at the toe. Do not wear sandals.  If you use a stepladder:  Make sure that it is fully opened. Do not climb a closed stepladder.  Make sure that both sides of the stepladder are locked into place.  Ask  someone to hold it for you, if possible.  Clearly mark and make sure that you can see:  Any grab bars or handrails.  First and last steps.  Where the edge of each step is.  Use tools that help you move around (mobility aids) if they are needed. These include:  Canes.  Walkers.  Scooters.  Crutches.  Turn on the lights when you go into a dark area. Replace any light bulbs as soon as they burn out.  Set up your furniture so you have a clear path. Avoid moving your furniture around.  If any of your floors are uneven, fix them.  If there are any pets around you, be aware of where they are.  Review your medicines with your doctor. Some medicines can make you feel dizzy. This can increase your chance of falling. Ask your doctor what other things that you can do to help prevent falls. This information is not intended to replace advice given to you by your health care provider. Make sure you discuss any questions you have with your health care provider. Document Released: 12/18/2008 Document Revised: 07/30/2015 Document Reviewed: 03/28/2014 Elsevier Interactive Patient Education  2017 [employer].

## 2017-04-03 ENCOUNTER — Other Ambulatory Visit: Payer: Self-pay

## 2017-04-03 MED ORDER — METANX 3-90.314-2-35 MG PO CAPS: 1.0000 | ORAL_CAPSULE | Freq: Two times a day (BID) | ORAL | 3 refills | Status: DC

## 2017-04-03 NOTE — Telephone Encounter (Signed)
Patient called to express her disappointment that medication was not filled. Patient states she is completely out of medication and requested a refill over 30 days ago.  Patient has consulted with her pharmacy [facility] and was told they reached out to [facility] x 2 with no response. Patient states she spoke with [place] (Dr.Pandy's Psychologist, sport and exercise) last week about this and was told then rx would be sent.  I reviewed record and was unable tot locate correspondence from [employer] or an encounter/interaction with [place].  I informed patient I will send RX request electronically and apologized for any inconvenience. I offered to send a short term supply to a local pharmacy and the patient stated due to the cost of medication she only receives through [facility].   Patient's request was completed to her apparent satisfaction.

## 2017-04-10 ENCOUNTER — Telehealth: Payer: Self-pay

## 2017-04-10 DIAGNOSIS — L900 Lichen sclerosus et atrophicus: Secondary | ICD-10-CM | POA: Diagnosis not present

## 2017-04-10 NOTE — Telephone Encounter (Signed)
Patient wanted to know the results of her test but was unable to turn it in on time since it was time sensitive. Patient is came to put up another sample kit to send off.

## 2017-04-10 NOTE — Telephone Encounter (Signed)
Patient called this morning stating that she dropped of IFOB test on 03/09/17 and wanted to know the results. Please advise today pt is waiting.

## 2017-04-11 ENCOUNTER — Other Ambulatory Visit: Payer: Self-pay

## 2017-04-11 ENCOUNTER — Telehealth: Payer: Self-pay | Admitting: *Deleted

## 2017-04-11 DIAGNOSIS — Z1211 Encounter for screening for malignant neoplasm of colon: Secondary | ICD-10-CM

## 2017-04-11 LAB — FECAL OCCULT BLOOD, GUAIAC: FECAL OCCULT BLD: NEGATIVE

## 2017-04-11 NOTE — Telephone Encounter (Signed)
Patient returned stool card to be sent to [facility]. Completed order form and mailed to [facility].

## 2017-04-12 ENCOUNTER — Encounter: Payer: Self-pay | Admitting: Internal Medicine

## 2017-04-24 ENCOUNTER — Encounter: Payer: Self-pay | Admitting: *Deleted

## 2017-04-26 ENCOUNTER — Encounter: Payer: Self-pay | Admitting: Internal Medicine

## 2017-04-26 ENCOUNTER — Non-Acute Institutional Stay: Payer: Medicare Other | Admitting: Internal Medicine

## 2017-04-26 VITALS — BP 118/70 | HR 64 | Temp 98.0°F | Resp 16 | Ht 65.0 in | Wt 153.8 lb

## 2017-04-26 DIAGNOSIS — H9193 Unspecified hearing loss, bilateral: Secondary | ICD-10-CM | POA: Diagnosis not present

## 2017-04-26 DIAGNOSIS — E782 Mixed hyperlipidemia: Secondary | ICD-10-CM

## 2017-04-26 DIAGNOSIS — K644 Residual hemorrhoidal skin tags: Secondary | ICD-10-CM

## 2017-04-26 DIAGNOSIS — E039 Hypothyroidism, unspecified: Secondary | ICD-10-CM

## 2017-04-26 DIAGNOSIS — B351 Tinea unguium: Secondary | ICD-10-CM

## 2017-04-26 DIAGNOSIS — Z780 Asymptomatic menopausal state: Secondary | ICD-10-CM | POA: Diagnosis not present

## 2017-04-26 DIAGNOSIS — E611 Iron deficiency: Secondary | ICD-10-CM

## 2017-04-26 DIAGNOSIS — L900 Lichen sclerosus et atrophicus: Secondary | ICD-10-CM | POA: Diagnosis not present

## 2017-04-26 NOTE — Progress Notes (Signed)
[facility]  Provider: Blanchie Serve MD   Location:  [place] of Service:  Clinic (12)  PCP: Blanchie Serve, MD Patient Care Team: Blanchie Serve, MD as PCP - General (Internal Medicine) Ronald Lobo, MD as Consulting Physician (Gastroenterology) Katy Apo, MD as Consulting Physician (Ophthalmology) Martinique, Amy, MD as Consulting Physician (Dermatology)  Extended Emergency Contact Information Primary Emergency Contact: Jeremy Johann Address: [address] [place] [address] [place] Phone: [phone] Mobile Phone: [phone] Relation: Son Secondary Emergency Contact: Snavely,Celia Address: [address]          [place], [place] [address] [place] Litter of [place] Phone: [phone] Mobile Phone: [phone] Relation: Friend   Goals of Care: Advanced Directive information Advanced Directives 03/29/2017  Does Patient Have a Medical Advance Directive? Yes  Type of Advance Directive [place]  Does patient want to make changes to medical advance directive? No - Patient declined  Copy of [place] in Chart? Yes     Chief Complaint  Patient presents with  . Medical Management of Chronic Issues    6 month follow up  . Medication Refill    No refills needed at this time    HPI: Patient is a 77 y.o. female seen today for routine follow up.   Lichen sclerosis- sees dermatology, on clobetasol ointment bid. Also on premarin cream three days a week.   Iron def anemia- takes ferrous sulfate, denies constipation.   Hypothyroidism- currently on levothyroxine 112 mcg alternating with 125 mcg daily. energy level is fair.   Hyperlipidemia- taking red yeast rice extract and omega 3. Allergic to statin  Due for mammogram, does not want mammogram done. Due for bone scan. FOBT mid of Jan resulted negative.   Past Medical History:  Diagnosis Date    . Anemia 12/2015  . Cataract   . Hearing loss 05/03/2016  . High cholesterol   . Osteopenia   . Pericarditis 01/1999  . Pneumothorax 01/1999  . Thyroid disorder   . Vitamin B 12 deficiency    Past Surgical History:  Procedure Laterality Date  . birthmark      removed,left leg  . CATARACT EXTRACTION  2017  . [place]?    reports that  has never smoked. she has never used smokeless tobacco. She reports that she drinks about 1.8 oz of alcohol per week. She reports that she does not use drugs. Social History   Socioeconomic History  . Marital status: Divorced    Spouse name: Not on file  . Number of children: 1  . Years of education: college  . Highest education level: Not on file  Social Needs  . Financial resource strain: Not hard at all  . Food insecurity - worry: Never true  . Food insecurity - inability: Never true  . Transportation needs - medical: No  . Transportation needs - non-medical: No  Occupational History  . Occupation: retired  Tobacco Use  . Smoking status: Never Smoker  . Smokeless tobacco: Never Used  Substance and Sexual Activity  . Alcohol use: Yes    Alcohol/week: 1.8 oz    Types: 3 Glasses of wine per week  . Drug use: No  . Sexual activity: Not on file  Other Topics Concern  . Not on file  Social History Narrative   Moved to [facility] 01/14/2015  Do you drink/eat things with caffeine?  yes   Marital status?      divorced                               Do you live in a house, apartment, assisted living, condo, trailer, etc.?  Retirement community   Is it one or more stories? Yes, 3   How many persons live in your home?  1   Do you have any pets in your home? (please list) 0   Current or past profession:  Librarian   Do you exercise?     yes                                 Type & how often? Walk-varies   Do you have a living will? yes   Do you have a DNR form?         no                         If not, do  you want to discuss one? yes   Do you have signed POA/HPOA for forms? yes    Functional Status Survey:    Family History  Problem Relation Age of Onset  . Diabetes Mother   . Asthma Mother   . Ataxia Father   . Cancer - Cervical Sister   . Thyroid disease Sister   . Diabetes Other   . Diabetes Maternal Grandmother   . Arthritis Paternal Grandmother   . Colon cancer Neg Hx     Health Maintenance  Topic Date Due  . COLONOSCOPY  02/09/2015  . TETANUS/TDAP  06/03/2018  . INFLUENZA VACCINE  Completed  . DEXA SCAN  Completed  . PNA vac Low Risk Adult  Completed    Allergies  Allergen Reactions  . Atorvastatin     Muscle weakness  . Rosuvastatin     Muscle weaknes    Outpatient Encounter Medications as of 04/26/2017  Medication Sig  . cholecalciferol (VITAMIN D) 1000 UNITS tablet Take 1,000 Units by mouth daily.  . clobetasol ointment (TEMOVATE) 6.72 % Apply 1 application topically every other day.   . conjugated estrogens (PREMARIN) vaginal cream Place 1 Applicatorful vaginally every other day.  Marland Kitchen FERROUS SULFATE PO Take 1 tablet by mouth daily.  Marland Kitchen l-methylfolate-B6-B12 (METANX) 3-35-2 MG TABS tablet Take 1 tablet by mouth daily.  Marland Kitchen levothyroxine (SYNTHROID, LEVOTHROID) 112 MCG tablet Take 1 tablet of 112 mcg strength on Tues, Thurs, Sat, and Sun. Alternate with 125 mcg tablets on other days of week.  . levothyroxine (SYNTHROID, LEVOTHROID) 125 MCG tablet Take 1 tablet of 125 mcg strength on Mon, Wed and Fri. Alternate days with 112 mcg dose on other days of week.  . Omega-3 Fatty Acids (FISH OIL) 1000 MG CAPS Take 1 capsule by mouth daily.   . Red Yeast Rice 600 MG TABS Take 1 tablet by mouth daily.   . [DISCONTINUED] L-Methylfolate-Algae-B12-B6 (METANX) 3-90.314-2-35 MG CAPS Take 1 tablet by mouth 2 (two) times daily.   No facility-administered encounter medications on file as of 04/26/2017.     Review of Systems  Constitutional: Negative for diaphoresis and unexpected  weight change.       Appetite is good, energy level fair, no fever or chills  HENT: Positive for hearing loss and tinnitus. Negative  for ear discharge, ear pain, sneezing, sore throat, trouble swallowing and voice change.        Occasional congestion with some PND, some hearing loss and has hearing aid  Eyes: Positive for visual disturbance. Negative for pain, redness and itching.       Reading glasses, s/p cataract surgery.   Respiratory: Negative for cough, choking, chest tightness, shortness of breath and wheezing.   Cardiovascular:       Denies chest pain, palpitation or leg edema  Gastrointestinal: Negative for abdominal pain, blood in stool, constipation, diarrhea, nausea, rectal pain and vomiting.       Had a bowel movement yesterday morning with some frank blood in toilet bowel. Few weeks back she had a sensation of something in her anus and since yesterday this feeling has resolved. No further blood in toilet bowl.   Genitourinary: Negative for difficulty urinating, dysuria, flank pain, frequency, hematuria and pelvic pain.       Gets up once at night to urinate  Musculoskeletal: Negative for arthralgias, back pain and joint swelling.       Plans to sign up for balance program. No fall reported. A month back she had pain to right hip radiating down right leg for a day that self resolved.   Skin: Negative for rash and wound.  Neurological: Positive for tremors. Negative for dizziness, seizures, syncope, speech difficulty, numbness and headaches.       Has noticed seldom tremors-with some movement  Hematological: Does not bruise/bleed easily.  Psychiatric/Behavioral: Negative for behavioral problems, confusion, dysphoric mood, hallucinations, self-injury, sleep disturbance and suicidal ideas.    Vitals:   04/26/17 0948  BP: 118/70  Pulse: 64  Resp: 16  Temp: 98 F (36.7 C)  TempSrc: Oral  SpO2: 97%  Weight: 153 lb 12.8 oz (69.8 kg)  Height: '5\' 5"'$  (1.651 m)   Body mass index  is 25.59 kg/m.   Wt Readings from Last 3 Encounters:  04/26/17 153 lb 12.8 oz (69.8 kg)  03/29/17 152 lb (68.9 kg)  10/12/16 151 lb 12.8 oz (68.9 kg)   Physical Exam  Constitutional: She is oriented to person, place, and time. She appears well-developed and well-nourished. No distress.  HENT:  Head: Normocephalic and atraumatic.  Nose: Nose normal.  Mouth/Throat: Oropharynx is clear and moist. No oropharyngeal exudate.  Hearing aid. Impacted cerumen to both ears  Eyes: Conjunctivae and EOM are normal. Pupils are equal, round, and reactive to light. Right eye exhibits no discharge. Left eye exhibits no discharge. No scleral icterus.  Neck: Normal range of motion. Neck supple. No JVD present. No thyromegaly present.  Cardiovascular: Normal rate, regular rhythm and normal heart sounds.  No murmur heard. Pulmonary/Chest: Effort normal and breath sounds normal. No respiratory distress. She has no wheezes. She has no rales.  Abdominal: Soft. Bowel sounds are normal. She exhibits no distension. There is no tenderness. There is no rebound and no guarding.  Genitourinary:  Genitourinary Comments: External hemorrhoid noted  Musculoskeletal: Normal range of motion. She exhibits no edema, tenderness or deformity.  Can move all 4, no assistive device uses  Lymphadenopathy:    She has no cervical adenopathy.  Neurological: She is alert and oriented to person, place, and time.  Skin: Skin is warm and dry. No rash noted. She is not diaphoretic. No erythema. No pallor.  Psychiatric: She has a normal mood and affect. Her behavior is normal. Judgment and thought content normal.    Labs reviewed: Basic Metabolic Panel: Recent  Labs    05/30/16 0801  NA 139  K 4.0  CL 105  CO2 27  GLUCOSE 86  BUN 11  CREATININE 0.65  CALCIUM 9.0   Liver Function Tests: Recent Labs    05/30/16 0801  AST 15  ALT 15  ALKPHOS 64  BILITOT 0.6  PROT 6.6  ALBUMIN 4.1   No results for input(s): LIPASE,  AMYLASE in the last 8760 hours. No results for input(s): AMMONIA in the last 8760 hours. CBC: Recent Labs    05/30/16 0801 01/16/17 0000  WBC 8.7 7.5  NEUTROABS 5,046  --   HGB 13.2 13.1  HCT 39.6 38.2  MCV 95.7 93.4  PLT 313 275   Cardiac Enzymes: No results for input(s): CKTOTAL, CKMB, CKMBINDEX, TROPONINI in the last 8760 hours. BNP: Invalid input(s): POCBNP No results found for: HGBA1C Lab Results  Component Value Date   TSH 2.45 01/16/2017   Lab Results  Component Value Date   VITAMINB12 >2000 (H) 11/23/2015   No results found for: FOLATE Lab Results  Component Value Date   IRON 78 01/16/2017   TIBC 326 01/16/2017   FERRITIN 30 01/16/2017    Lipid Panel: Recent Labs    01/16/17 0000  CHOL 248*  HDL 57  TRIG 127  CHOLHDL 4.4   No results found for: HGBA1C  Procedures since last visit: No results found.  03/2017 FOBT negative  Assessment/Plan  1. Hypothyroidism, unspecified type Lab Results  Component Value Date   TSH 2.45 01/16/2017   C/w levothyroxine current regimen - CMP with eGFR([facility]); Future - CBC with Differential/Platelets; Future - TSH; Future  2. Bilateral hearing loss, unspecified hearing loss type Encouraged to wear her hearing aid  3. HYPERLIPIDEMIA Allergic to statin, continue current regimen - CBC with Differential/Platelets; Future - TSH; Future  4. Iron deficiency Reviewed last ferritin. Continue feso4 - CBC with Differential/Platelets; Future - TSH; Future - Ferritin; Future  5. Lichen sclerosus Followed by gyn  6. Fungal infection of toenail Advised to soak feet in warm water with vinegar daily and to keep skin moisturized for now. If no help, will provide topical antifungal.   7. External hemorrhoid No bleed at present. Denies rectal pain or itching. Advised on fiber supplement and avoiding constipation  8. Postmenopausal estrogen deficiency On premarin 3 days a week - DG Bone Density; Future   Next  appointment: 6 month for physical with EKG, MMSE  Communication: reviewed care plan with patient   Blanchie Serve, MD  [facility] [phone] (Monday-Friday 8 am - 5 pm) [phone] (afterhours)

## 2017-04-27 ENCOUNTER — Non-Acute Institutional Stay: Payer: Medicare Other

## 2017-04-27 DIAGNOSIS — R7989 Other specified abnormal findings of blood chemistry: Secondary | ICD-10-CM | POA: Diagnosis not present

## 2017-04-27 DIAGNOSIS — E039 Hypothyroidism, unspecified: Secondary | ICD-10-CM | POA: Diagnosis not present

## 2017-04-27 DIAGNOSIS — E611 Iron deficiency: Secondary | ICD-10-CM | POA: Diagnosis not present

## 2017-04-27 DIAGNOSIS — E782 Mixed hyperlipidemia: Secondary | ICD-10-CM

## 2017-04-27 LAB — CBC
HCT: 37.7
HEMOGLOBIN: 13.1
WBC: 7.6
platelet count: 270

## 2017-04-27 LAB — COMPLETE METABOLIC PANEL WITH GFR
ALBUMIN: 3.9
ALT: 16
AST: 17
Alkaline Phosphatase: 64
BUN: 13 (ref 4–21)
Calcium: 9
Creat: 0.61
GLUCOSE: 95
POTASSIUM: 4.3
SODIUM: 140
TOTAL PROTEIN: 6.3 g/dL
Total Bilirubin: 0.6

## 2017-04-27 LAB — THYROID PANEL
T4,Free (Direct): 1.2
TSH: 15.86

## 2017-05-02 LAB — COMPLETE METABOLIC PANEL WITH GFR
AG Ratio: 1.6 (calc) (ref 1.0–2.5)
ALKALINE PHOSPHATASE (APISO): 64 U/L (ref 33–130)
ALT: 16 U/L (ref 6–29)
AST: 17 U/L (ref 10–35)
Albumin: 3.9 g/dL (ref 3.6–5.1)
BUN: 13 mg/dL (ref 7–25)
CALCIUM: 9 mg/dL (ref 8.6–10.4)
CO2: 29 mmol/L (ref 20–32)
CREATININE: 0.61 mg/dL (ref 0.60–0.93)
Chloride: 105 mmol/L (ref 98–110)
GFR, EST NON AFRICAN AMERICAN: 89 mL/min/{1.73_m2} (ref >=60)
GFR, Est African American: 103 mL/min/{1.73_m2} (ref >=60)
GLOBULIN: 2.4 g/dL (ref 1.9–3.7)
GLUCOSE: 95 mg/dL (ref 65–99)
Potassium: 4.3 mmol/L (ref 3.5–5.3)
SODIUM: 140 mmol/L (ref 135–146)
Total Bilirubin: 0.6 mg/dL (ref 0.2–1.2)
Total Protein: 6.3 g/dL (ref 6.1–8.1)

## 2017-05-02 LAB — CBC WITH DIFFERENTIAL/PLATELET
BASOS PCT: 0.7 %
Basophils Absolute: 53 cells/uL (ref 0–200)
EOS ABS: 251 {cells}/uL (ref 15–500)
Eosinophils Relative: 3.3 %
HEMATOCRIT: 37.7 % (ref 35.0–45.0)
HEMOGLOBIN: 13.1 g/dL (ref 11.7–15.5)
LYMPHS ABS: 2044 {cells}/uL (ref 850–3900)
MCH: 32.7 pg (ref 27.0–33.0)
MCHC: 34.7 g/dL (ref 32.0–36.0)
MCV: 94 fL (ref 80.0–100.0)
MPV: 11 fL (ref 7.5–12.5)
Monocytes Relative: 6.3 %
NEUTROS ABS: 4773 {cells}/uL (ref 1500–7800)
Neutrophils Relative %: 62.8 %
PLATELETS: 270 10*3/uL (ref 140–400)
RBC: 4.01 10*6/uL (ref 3.80–5.10)
RDW: 12.9 % (ref 11.0–15.0)
Total Lymphocyte: 26.9 %
WBC: 7.6 10*3/uL (ref 3.8–10.8)
WBCMIX: 479 {cells}/uL (ref 200–950)

## 2017-05-02 LAB — FERRITIN: Ferritin: 39 ng/mL (ref 20–288)

## 2017-05-02 LAB — TEST AUTHORIZATION

## 2017-05-02 LAB — TSH: TSH: 15.86 m[IU]/L — AB (ref 0.40–4.50)

## 2017-05-02 LAB — T4, FREE: FREE T4: 1.2 ng/dL (ref 0.8–1.8)

## 2017-05-04 ENCOUNTER — Telehealth: Payer: Self-pay | Admitting: *Deleted

## 2017-05-04 DIAGNOSIS — E039 Hypothyroidism, unspecified: Secondary | ICD-10-CM

## 2017-05-04 DIAGNOSIS — R7989 Other specified abnormal findings of blood chemistry: Secondary | ICD-10-CM

## 2017-05-04 NOTE — Addendum Note (Signed)
Addended by: Royann Shivers A on: 05/04/2017 02:43 PM   Modules accepted: Orders

## 2017-05-04 NOTE — Telephone Encounter (Signed)
Patient notified of results and of lab appt. She verbalized understanding.

## 2017-05-04 NOTE — Telephone Encounter (Signed)
Free T4 normal. Have patient take current dosing of levothyroxine empty stomach 1st thing in morning as suggested in my last lab note. Will need TSH and free T4 checked in 4 weeks. Have lab order placed and notify patient of appointment.

## 2017-05-04 NOTE — Telephone Encounter (Signed)
Received lab results for Free T4. Showing 1.2. Labs on your desk for review.

## 2017-05-04 NOTE — Telephone Encounter (Signed)
Message left for patient to return my call. Labs ordered and scheduled for 06/01/17 @750am 

## 2017-05-10 ENCOUNTER — Encounter: Payer: Self-pay | Admitting: Internal Medicine

## 2017-05-10 NOTE — Progress Notes (Signed)
Patient had lab work 04/27/17 that resulted on 05/03/17 and brought to my attention today. On lab review, TSH 15.86 with normal free T4. Of 1.2. Patient currently on alternating dosing of levothyroxine 112 mcg and 125 mcg.   Please call patient to see if she takes levothyroxine first this in morning empty stomach and takes other medications an hour later. If not, advise her to do so. Also, have her take levothyroxine 125 mcg only daily for now instead of alternating her dosing.   Place lab order for TSH and free T4 in 6 weeks for hypothyroidism.

## 2017-05-11 ENCOUNTER — Telehealth: Payer: Self-pay

## 2017-05-11 ENCOUNTER — Encounter: Payer: Self-pay | Admitting: *Deleted

## 2017-05-11 NOTE — Telephone Encounter (Signed)
Alley with the [facility] called to inform Dr.Pandy that Bone Density order needs to be corrected.  Insurance will not cover post menopausal/estrogen deficiency, order needs to be updated with estrogen deficiency only.  If you have questions or concerns please contact [facility] directly at [phone], [phone], otherwise once order is placed it will flow into the [facility]'s workque

## 2017-05-11 NOTE — Telephone Encounter (Signed)
Addressed and signed.   Received new order with correct diagnosis from breast center.

## 2017-05-31 DIAGNOSIS — E039 Hypothyroidism, unspecified: Secondary | ICD-10-CM | POA: Diagnosis not present

## 2017-05-31 DIAGNOSIS — R7989 Other specified abnormal findings of blood chemistry: Secondary | ICD-10-CM | POA: Diagnosis not present

## 2017-06-01 ENCOUNTER — Other Ambulatory Visit: Payer: Medicare Other

## 2017-06-01 DIAGNOSIS — R7989 Other specified abnormal findings of blood chemistry: Secondary | ICD-10-CM

## 2017-06-01 DIAGNOSIS — E039 Hypothyroidism, unspecified: Secondary | ICD-10-CM

## 2017-06-01 LAB — T4, FREE: Free T4: 1.5 ng/dL (ref 0.8–1.8)

## 2017-06-01 LAB — TSH: TSH: 1.92 m[IU]/L (ref 0.40–4.50)

## 2017-06-07 ENCOUNTER — Ambulatory Visit: Payer: Medicare Other | Admitting: Internal Medicine

## 2017-06-07 ENCOUNTER — Encounter: Payer: Self-pay | Admitting: Internal Medicine

## 2017-06-07 ENCOUNTER — Encounter: Payer: Self-pay | Admitting: Gastroenterology

## 2017-06-07 VITALS — BP 114/68 | HR 63 | Temp 97.6°F | Resp 16 | Ht 65.0 in | Wt 153.4 lb

## 2017-06-07 DIAGNOSIS — L290 Pruritus ani: Secondary | ICD-10-CM | POA: Diagnosis not present

## 2017-06-07 DIAGNOSIS — K625 Hemorrhage of anus and rectum: Secondary | ICD-10-CM | POA: Diagnosis not present

## 2017-06-07 DIAGNOSIS — E039 Hypothyroidism, unspecified: Secondary | ICD-10-CM

## 2017-06-07 MED ORDER — HYDROCORTISONE 0.5 % EX OINT: 1.0000 "application " | TOPICAL_OINTMENT | Freq: Two times a day (BID) | CUTANEOUS | 0 refills | Status: DC

## 2017-06-07 NOTE — Progress Notes (Signed)
[facility]  Provider: Blanchie Serve MD   Location:      Place of Service:     PCP: Blanchie Serve, MD Patient Care Team: Blanchie Serve, MD as PCP - General (Internal Medicine) Ronald Lobo, MD as Consulting Physician (Gastroenterology) Katy Apo, MD as Consulting Physician (Ophthalmology) Martinique, Amy, MD as Consulting Physician (Dermatology)  Extended Emergency Contact Information Primary Emergency Contact: Cuadrado,Nicholas Address: [address] [place] [address] [place] Phone: [phone] Mobile Phone: [phone] Relation: Son Secondary Emergency Contact: Snavely,Celia Address: [address]          [place],  [address] [place] Phone: [phone] Mobile Phone: [phone] Relation: Friend   Goals of Care: Advanced Directive information Advanced Directives 03/29/2017  Does Patient Have a Medical Advance Directive? Yes  Type of Advance Directive [place]  Does patient want to make changes to medical advance directive? No - Patient declined  Copy of [place] in Chart? Yes      Chief Complaint  Patient presents with  . Acute Visit    Hemorrhoids. Patient stated that she noticed some bleeding from the rectal area 2 weeks ago. Some irritation while sitting. Patient stated that she hasn't used anything for her hemorrhoids.     HPI: Patient is a 77 y.o. female seen today for acute visit. She noticed some bleeding from rectal area 2 weeks back. She has some irritation to her bottom while sitting. On medication review, she is currently on ferrous sulfate for iron deficiency and has history of hypothyroidism. She is on levothyroxine. On her last visit, during exam, external hemorrhoids were noted. When I had seen her in February, she had episode of blood in her toilet bowl. She denies constipation or straining.   She has been taking her  levothyroxine empty stomach first thing in the morning after her last visit where TSH was high. Repeat labs drawn on 05/31/17 has resulted.  Past Medical History:  Diagnosis Date  . Anemia 12/2015  . Cataract   . Hearing loss 05/03/2016  . High cholesterol   . Osteopenia   . Pericarditis 01/1999  . Pneumothorax 01/1999  . Thyroid disorder   . Vitamin B 12 deficiency    Past Surgical History:  Procedure Laterality Date  . birthmark      removed,left leg  . CATARACT EXTRACTION  2017  . [place]?    reports that she has never smoked. She has never used smokeless tobacco. She reports that she drinks about 1.8 oz of alcohol per week. She reports that she does not use drugs. Social History   Socioeconomic History  . Marital status: Divorced    Spouse name: Not on file  . Number of children: 1  . Years of education: college  . Highest education level: Not on file  Occupational History  . Occupation: retired  Scientific laboratory technician  . Financial resource strain: Not hard at all  . Food insecurity:    Worry: Never true    Inability: Never true  . Transportation needs:    Medical: No    Non-medical: No  Tobacco Use  . Smoking status: Never Smoker  . Smokeless tobacco: Never Used  Substance and Sexual Activity  . Alcohol use: Yes    Alcohol/week: 1.8 oz    Types: 3 Glasses of wine per week  . Drug use:  No  . Sexual activity: Not on file  Lifestyle  . Physical activity:    Days per week: 4 days    Minutes per session: 30 min  . Stress: Only a little  Relationships  . Social connections:    Talks on phone: Three times a week    Gets together: More than three times a week    Attends religious service: More than 4 times per year    Active member of club or organization: Yes    Attends meetings of clubs or organizations: Never    Relationship status: Widowed  . Intimate partner violence:    Fear of current or ex partner: No    Emotionally abused: No     Physically abused: No    Forced sexual activity: No  Other Topics Concern  . Not on file  Social History Narrative   Moved to [facility] 01/14/2015   Do you drink/eat things with caffeine?  yes   Marital status?      divorced                               Do you live in a house, apartment, assisted living, condo, trailer, etc.?  Retirement community   Is it one or more stories? Yes, 3   How many persons live in your home?  1   Do you have any pets in your home? (please list) 0   Current or past profession:  Librarian   Do you exercise?     yes                                 Type & how often? Walk-varies   Do you have a living will? yes   Do you have a DNR form?         no                         If not, do you want to discuss one? yes   Do you have signed POA/HPOA for forms? yes    Functional Status Survey:    Family History  Problem Relation Age of Onset  . Diabetes Mother   . Asthma Mother   . Ataxia Father   . Cancer - Cervical Sister   . Thyroid disease Sister   . Diabetes Other   . Diabetes Maternal Grandmother   . Arthritis Paternal Grandmother   . Colon cancer Neg Hx     Health Maintenance  Topic Date Due  . INFLUENZA VACCINE  10/05/2017  . TETANUS/TDAP  06/03/2018  . DEXA SCAN  Completed  . PNA vac Low Risk Adult  Completed    Allergies  Allergen Reactions  . Atorvastatin     Muscle weakness  . Rosuvastatin     Muscle weaknes    Outpatient Encounter Medications as of 06/07/2017  Medication Sig  . cholecalciferol (VITAMIN D) 1000 UNITS tablet Take 1,000 Units by mouth daily.  . clobetasol ointment (TEMOVATE) 7.10 % Apply 1 application topically every other day.   . conjugated estrogens (PREMARIN) vaginal cream Place 1 Applicatorful vaginally every other day.  Marland Kitchen FERROUS SULFATE PO Take 1 tablet by mouth daily.  Marland Kitchen l-methylfolate-B6-B12 (METANX) 3-35-2 MG TABS tablet Take 1 tablet by mouth daily.  Marland Kitchen levothyroxine (SYNTHROID, LEVOTHROID) 125 MCG  tablet Take 1  tablet of 125 mcg strength on Mon, Wed and Fri. Alternate days with 112 mcg dose on other days of week.  . Omega-3 Fatty Acids (FISH OIL) 1000 MG CAPS Take 1 capsule by mouth daily.   . Red Yeast Rice 600 MG TABS Take 1 tablet by mouth daily.   . hydrocortisone ointment 0.5 % Apply 1 application topically 2 (two) times daily.  . [DISCONTINUED] levothyroxine (SYNTHROID, LEVOTHROID) 112 MCG tablet Take 1 tablet of 112 mcg strength on Tues, Thurs, Sat, and Sun. Alternate with 125 mcg tablets on other days of week.   No facility-administered encounter medications on file as of 06/07/2017.     Review of Systems  Constitutional: Negative for appetite change and fever.  Gastrointestinal: Positive for blood in stool and rectal pain. Negative for abdominal distention, abdominal pain, constipation, nausea and vomiting.       Denies heartburn or epigastric pain  Skin: Negative for rash.  Neurological: Negative for dizziness and headaches.  Psychiatric/Behavioral: Negative for confusion.    Vitals:   06/07/17 0835  BP: 114/68  Pulse: 63  Resp: 16  Temp: 97.6 F (36.4 C)  TempSrc: Oral  SpO2: 99%  Weight: 153 lb 6.4 oz (69.6 kg)  Height: 5\' 5"  (1.651 m)   Body mass index is 25.53 kg/m.   Wt Readings from Last 3 Encounters:  06/07/17 153 lb 6.4 oz (69.6 kg)  04/26/17 153 lb 12.8 oz (69.8 kg)  03/29/17 152 lb (68.9 kg)   Physical Exam  Constitutional: She is oriented to person, place, and time. She appears well-developed and well-nourished.  HENT:  Head: Normocephalic and atraumatic.  Abdominal: Soft. Bowel sounds are normal. There is no tenderness. There is no guarding.  Genitourinary: Rectal exam shows guaiac positive stool.  Genitourinary Comments: External hemorrhoid on exam, no active bleed noted, no palpable mass on rectal exam, stool present, guaiac stool positive for blood  Musculoskeletal: Normal range of motion. She exhibits no edema.  Neurological: She is alert  and oriented to person, place, and time.  Skin: Skin is warm and dry.  Psychiatric: She has a normal mood and affect.    Labs reviewed: Basic Metabolic Panel: Recent Labs    04/27/17 04/27/17 0815  NA 140 140  K 4.3 4.3  CL  --  105  CO2  --  29  GLUCOSE  --  95  BUN 13 13  CREATININE 0.61 0.61  CALCIUM 9.0 9.0   Liver Function Tests: Recent Labs    04/27/17 04/27/17 0815  AST 17 17  ALT 16 16  ALKPHOS 64  --   BILITOT 0.6 0.6  PROT 6.3 6.3  ALBUMIN 3.9  --    No results for input(s): LIPASE, AMYLASE in the last 8760 hours. No results for input(s): AMMONIA in the last 8760 hours. CBC: Recent Labs    01/16/17 0000 04/27/17 04/27/17 0815  WBC 7.5 7.6 7.6  NEUTROABS  --   --  4,773  HGB 13.1 13.1 13.1  HCT 38.2 37.7 37.7  MCV 93.4  --  94.0  PLT 275  --  270   Cardiac Enzymes: No results for input(s): CKTOTAL, CKMB, CKMBINDEX, TROPONINI in the last 8760 hours. BNP: Invalid input(s): POCBNP No results found for: HGBA1C Lab Results  Component Value Date   TSH 1.92 05/31/2017   Lab Results  Component Value Date   VITAMINB12 >2000 (H) 11/23/2015   No results found for: FOLATE Lab Results  Component Value Date   IRON  78 01/16/2017   TIBC 326 01/16/2017   FERRITIN 39 04/27/2017    Lipid Panel: Recent Labs    01/16/17 0000  CHOL 248*  HDL 57  LDLCALC 165*  TRIG 127  CHOLHDL 4.4   No results found for: HGBA1C  Procedures since last visit: No results found.  Assessment/Plan  1. Rectal bleed Concern for internal hemorrhoid vs mass/ tumor. Unlikely upper GI bleed given fresh blood, absence of epigastric pain. Last colonoscopy about 10 years back showed benign polyp. Recent FOBT was negative but today guiac positive. Stable hemoglobin and vital signs stable. Referral to GI for need for anoscopy/ sigmoidoscopy and colonoscopy to work up GI bleed.  - Ambulatory referral to Gastroenterology  2. Rectal itching Likely from external hemorrhoid being  irritated and inflamed. Encouraged fiber intake - hydrocortisone ointment 0.5 %; Apply 1 application topically 2 (two) times daily.  Dispense: 30 g; Refill: 0  3. Hypothyroidism, unspecified type Improved TSH on review. Continue current regimen of levothyroxine.     Labs/tests ordered:  GI referral  Next appointment: has f/u in September 2019  Communication: reviewed care plan with patient    Blanchie Serve, MD Internal Medicine [place], [place] [address] Cell Phone (Monday-Friday 8 am - 5 pm): [phone] On Call: [phone] and follow prompts after 5 pm and on weekends Office Phone: [phone] Office Fax: [phone]

## 2017-06-07 NOTE — Patient Instructions (Signed)
  Apply hydrocortisone cream twice a day around your anal area to help with itching and discomfort.  You will hear back from our office and/or GI office about your appointment with GI.  Your thyroid hormone level has stablized. Continue levothyroxine the current way.

## 2017-06-12 ENCOUNTER — Ambulatory Visit (INDEPENDENT_AMBULATORY_CARE_PROVIDER_SITE_OTHER): Payer: Medicare Other | Admitting: Gastroenterology

## 2017-06-12 ENCOUNTER — Encounter: Payer: Self-pay | Admitting: Gastroenterology

## 2017-06-12 VITALS — BP 114/72 | HR 76 | Ht 64.5 in | Wt 154.0 lb

## 2017-06-12 DIAGNOSIS — R198 Other specified symptoms and signs involving the digestive system and abdomen: Secondary | ICD-10-CM

## 2017-06-12 DIAGNOSIS — K625 Hemorrhage of anus and rectum: Secondary | ICD-10-CM | POA: Diagnosis not present

## 2017-06-12 MED ORDER — PEG 3350-KCL-NA BICARB-NACL 420 G PO SOLR: 4000.0000 mL | ORAL | 0 refills | Status: DC

## 2017-06-12 NOTE — Progress Notes (Signed)
Review of pertinent gastrointestinal problems: 1.  Personal history of precancerous colon polyps.  Colonoscopy 2006 for rectal bleeding.  A single subcentimeter tubular adenoma was removed.  I felt that her rectal bleeding might be from self-limited infection or perhaps an anal fissure.  She was recommended to have repeat colonoscopy at 5-year interval.    HPI: This is a very pleasant 77 year old woman who was referred to me by Blanchie Serve, MD  to evaluate rectal bleeding, change in bowels, hemorrhoidal discomfort.    Chief complaint is minor rectal bleeding, change in bowels, hemorrhoidal discomfort  Friday, PCP found microsopic blood in her stool in the office.  Over the past month or 2 she has noticed a change in her bowels to gradual loosening of her stools.  She has seen some  mucus discharge and on 2 occasions she has noticed some hemorrhoidal type swelling and bump at her backside with rectal bleeding.  There is mild discomfort there.    She has had some generalized vague abdominal discomfort.  She is not losing weight.     She is very bothered by some fecal discharge that will happen after a bowel movement over the past 2 or 3 months as well.   No family history of colon cancer  Old Data Reviewed:  Labs  04/2017: normal CBC, normal CMET, iFOBT negative She is on iron supplement for Hb 11.1, low iron levels 2017    Review of systems: Pertinent positive and negative review of systems were noted in the above HPI section. All other review negative.   Past Medical History:  Diagnosis Date  . Anemia 12/2015  . Cataract   . Hearing loss 05/03/2016  . High cholesterol   . Osteopenia   . Pericarditis 01/1999  . Pneumothorax 01/1999  . Thyroid disorder   . Vitamin B 12 deficiency     Past Surgical History:  Procedure Laterality Date  . birthmark      removed,left leg  . CATARACT EXTRACTION  2017  . [place]?    Current Outpatient  Medications  Medication Sig Dispense Refill  . cholecalciferol (VITAMIN D) 1000 UNITS tablet Take 1,000 Units by mouth daily.    . clobetasol ointment (TEMOVATE) 2.35 % Apply 1 application topically every other day.     . conjugated estrogens (PREMARIN) vaginal cream Place 1 Applicatorful vaginally every other day.    Marland Kitchen FERROUS SULFATE PO Take 1 tablet by mouth daily.    Marland Kitchen l-methylfolate-B6-B12 (METANX) 3-35-2 MG TABS tablet Take 1 tablet by mouth daily.    Marland Kitchen levothyroxine (SYNTHROID, LEVOTHROID) 125 MCG tablet Take 1 tablet of 125 mcg strength on Mon, Wed and Fri. Alternate days with 112 mcg dose on other days of week. 40 tablet 3  . Omega-3 Fatty Acids (FISH OIL) 1000 MG CAPS Take 1 capsule by mouth daily.     . Red Yeast Rice 600 MG TABS Take 1 tablet by mouth daily.     . hydrocortisone ointment 0.5 % Apply 1 application topically 2 (two) times daily. (Patient not taking: Reported on 06/12/2017) 30 g 0   No current facility-administered medications for this visit.     Allergies as of 06/12/2017 - Review Complete 06/12/2017  Allergen Reaction Noted  . Atorvastatin    . Rosuvastatin      Family History  Problem Relation Age of Onset  . Diabetes Mother   . Asthma Mother   . Ataxia Father   . Cancer - Cervical  Sister   . Thyroid disease Sister   . Diabetes Other   . Diabetes Maternal Grandmother   . Arthritis Paternal Grandmother   . Colon cancer Neg Hx     Social History   Socioeconomic History  . Marital status: Divorced    Spouse name: Not on file  . Number of children: 1  . Years of education: college  . Highest education level: Not on file  Occupational History  . Occupation: retired  Scientific laboratory technician  . Financial resource strain: Not hard at all  . Food insecurity:    Worry: Never true    Inability: Never true  . Transportation needs:    Medical: No    Non-medical: No  Tobacco Use  . Smoking status: Never Smoker  . Smokeless tobacco: Never Used  Substance and  Sexual Activity  . Alcohol use: Yes    Alcohol/week: 1.8 oz    Types: 3 Glasses of wine per week  . Drug use: No  . Sexual activity: Not on file  Lifestyle  . Physical activity:    Days per week: 4 days    Minutes per session: 30 min  . Stress: Only a little  Relationships  . Social connections:    Talks on phone: Three times a week    Gets together: More than three times a week    Attends religious service: More than 4 times per year    Active member of club or organization: Yes    Attends meetings of clubs or organizations: Never    Relationship status: Widowed  . Intimate partner violence:    Fear of current or ex partner: No    Emotionally abused: No    Physically abused: No    Forced sexual activity: No  Other Topics Concern  . Not on file  Social History Narrative   Moved to [facility] 01/14/2015   Do you drink/eat things with caffeine?  yes   Marital status?      divorced                               Do you live in a house, apartment, assisted living, condo, trailer, etc.?  Retirement community   Is it one or more stories? Yes, 3   How many persons live in your home?  1   Do you have any pets in your home? (please list) 0   Current or past profession:  Librarian   Do you exercise?     yes                                 Type & how often? Walk-varies   Do you have a living will? yes   Do you have a DNR form?         no                         If not, do you want to discuss one? yes   Do you have signed POA/HPOA for forms? yes     Physical Exam: BP 114/72   Pulse 76   Ht 5' 4.5" (1.638 m)   Wt 154 lb (69.9 kg)   BMI 26.03 kg/m  Constitutional: generally well-appearing Psychiatric: alert and oriented x3 Eyes: extraocular movements intact Mouth: oral pharynx moist, no lesions Neck: supple  no lymphadenopathy Cardiovascular: heart regular rate and rhythm Lungs: clear to auscultation bilaterally Abdomen: soft, nontender, nondistended, no obvious ascites,  no peritoneal signs, normal bowel sounds Extremities: no lower extremity edema bilaterally Skin: no lesions on visible extremities Rectal examination with female assistant in the room: small external hemorrhoid, no rectal masses, stool was brown and not check for hemocult.  Assessment and plan: 77 y.o. female with small external hemorrhoid, change in bowels, minor rectal bleeding  Not sure why she has had a relative change in her bowels however she does feel that her stools have been generally looser since she moved into a assisted care facility.  I recommended she start a fiber supplement once daily and undergo colonoscopy at her soonest convenience.  She did ask about hemorrhoidal therapies and if she has internal hemorrhoid associated with the external one, I would likely refer her to 1 of my colleagues for in office banding.    Please see the "Patient Instructions" section for addition details about the plan.   Owens Loffler, MD [facility] 06/12/2017, 8:32 AM  Cc: Blanchie Serve, MD

## 2017-06-12 NOTE — Patient Instructions (Addendum)
You will be set up for a colonoscopy for rectal bleeding, history of adenomatous colon polyp (2006 colonoscopy). Please start taking citrucel (orange flavored) powder fiber supplement.  This may cause some bloating at first but that usually goes away. Begin with a small spoonful and work your way up to a large, heaping spoonful daily over a week.  Normal BMI (Body Mass Index- based on height and weight) is between 23 and 30. Your BMI today is Body mass index is 26.03 kg/m. Marland Kitchen Please consider follow up  regarding your BMI with your Primary Care Provider.

## 2017-06-20 ENCOUNTER — Telehealth: Payer: Self-pay | Admitting: Gastroenterology

## 2017-06-20 NOTE — Telephone Encounter (Signed)
Left message on machine to call back  

## 2017-06-20 NOTE — Telephone Encounter (Signed)
The pt has been advised to keep her appt for her colon as scheduled, she did agree.  She also states she has been using 3 tablespoons 3 times daily of citrucel.  I did advise her to only use only one tablespoon daily, you may increase to twice daily as needed. She agreed and will call back as needed

## 2017-06-30 ENCOUNTER — Other Ambulatory Visit: Payer: Self-pay | Admitting: *Deleted

## 2017-06-30 DIAGNOSIS — M8580 Other specified disorders of bone density and structure, unspecified site: Secondary | ICD-10-CM

## 2017-06-30 DIAGNOSIS — E2839 Other primary ovarian failure: Secondary | ICD-10-CM

## 2017-06-30 DIAGNOSIS — Z8739 Personal history of other diseases of the musculoskeletal system and connective tissue: Secondary | ICD-10-CM

## 2017-06-30 NOTE — Telephone Encounter (Signed)
Can you check on this so we have it addressed.

## 2017-06-30 NOTE — Telephone Encounter (Signed)
Alley with the [facility] called again to say that they have not received the corrected order for the bone density. She stated that this is her third attempt to get corrected order with dx of estrogen deficiency or osteopenia and if it was not received, she would have no choice but to put the order aside and work on other orders.    Please call alley with any questions at [phone] ext [phone]

## 2017-07-05 DIAGNOSIS — N952 Postmenopausal atrophic vaginitis: Secondary | ICD-10-CM | POA: Diagnosis not present

## 2017-07-05 DIAGNOSIS — L900 Lichen sclerosus et atrophicus: Secondary | ICD-10-CM | POA: Diagnosis not present

## 2017-07-11 ENCOUNTER — Ambulatory Visit (AMBULATORY_SURGERY_CENTER): Payer: Medicare Other | Admitting: Gastroenterology

## 2017-07-11 ENCOUNTER — Encounter: Payer: Self-pay | Admitting: Gastroenterology

## 2017-07-11 ENCOUNTER — Other Ambulatory Visit: Payer: Self-pay

## 2017-07-11 VITALS — BP 115/69 | HR 55 | Temp 97.1°F | Resp 11 | Ht 64.5 in | Wt 154.0 lb

## 2017-07-11 DIAGNOSIS — Z86010 Personal history of colonic polyps: Secondary | ICD-10-CM | POA: Diagnosis not present

## 2017-07-11 DIAGNOSIS — K625 Hemorrhage of anus and rectum: Secondary | ICD-10-CM | POA: Diagnosis not present

## 2017-07-11 DIAGNOSIS — K921 Melena: Secondary | ICD-10-CM

## 2017-07-11 DIAGNOSIS — D120 Benign neoplasm of cecum: Secondary | ICD-10-CM

## 2017-07-11 DIAGNOSIS — D122 Benign neoplasm of ascending colon: Secondary | ICD-10-CM | POA: Diagnosis not present

## 2017-07-11 MED ORDER — SODIUM CHLORIDE 0.9 % IV SOLN: 500.0000 mL | Freq: Once | INTRAVENOUS | Status: DC

## 2017-07-11 NOTE — Progress Notes (Signed)
To PACU, VSS. Report to [email or web address] 

## 2017-07-11 NOTE — Patient Instructions (Signed)
Handout given on polyps  YOU HAD AN ENDOSCOPIC PROCEDURE TODAY: Refer to the procedure report and other information in the discharge instructions given to you for any specific questions about what was found during the examination. If this information does not answer your questions, please call  office at [phone] to clarify.   YOU SHOULD EXPECT: Some feelings of bloating in the abdomen. Passage of more gas than usual. Walking can help get rid of the air that was put into your GI tract during the procedure and reduce the bloating. If you had a lower endoscopy (such as a colonoscopy or flexible sigmoidoscopy) you may notice spotting of blood in your stool or on the toilet paper. Some abdominal soreness may be present for a day or two, also.  DIET: Your first meal following the procedure should be a light meal and then it is ok to progress to your normal diet. A half-sandwich or bowl of soup is an example of a good first meal. Heavy or fried foods are harder to digest and may make you feel nauseous or bloated. Drink plenty of fluids but you should avoid alcoholic beverages for 24 hours. If you had a esophageal dilation, please see attached instructions for diet.    ACTIVITY: Your care partner should take you home directly after the procedure. You should plan to take it easy, moving slowly for the rest of the day. You can resume normal activity the day after the procedure however YOU SHOULD NOT DRIVE, use power tools, machinery or perform tasks that involve climbing or major physical exertion for 24 hours (because of the sedation medicines used during the test).   SYMPTOMS TO REPORT IMMEDIATELY: A gastroenterologist can be reached at any hour. Please call [phone]  for any of the following symptoms:  Following lower endoscopy (colonoscopy, flexible sigmoidoscopy) Excessive amounts of blood in the stool  Significant tenderness, worsening of abdominal pains  Swelling of the abdomen that is  new, acute  Fever of 100 or higher    FOLLOW UP:  If any biopsies were taken you will be contacted by phone or by letter within the next 1-3 weeks. Call [phone]  if you have not heard about the biopsies in 3 weeks.  Please also call with any specific questions about appointments or follow up tests.  

## 2017-07-11 NOTE — Progress Notes (Signed)
Called to room to assist during endoscopic procedure.  Patient ID and intended procedure confirmed with present staff. Received instructions for my participation in the procedure from the performing physician.  

## 2017-07-11 NOTE — Op Note (Signed)
[facility] Patient Name: Jessica [name] Procedure Date: 07/11/2017 3:05 PM MRN: [id number] Endoscopist: Milus Banister , MD Age: 77 Referring MD:  Date of Birth: 1941-02-10 Gender: Female Account #: [id number] Procedure:                Colonoscopy Indications:              High risk colon cancer surveillance: Personal                            history of colonic polyps; 2006 colonoscopy single                            subCM adenoma Medicines:                Monitored Anesthesia Care Procedure:                Pre-Anesthesia Assessment:                           - Prior to the procedure, a History and Physical                            was performed, and patient medications and                            allergies were reviewed. The patient's tolerance of                            previous anesthesia was also reviewed. The risks                            and benefits of the procedure and the sedation                            options and risks were discussed with the patient.                            All questions were answered, and informed consent                            was obtained. Prior Anticoagulants: The patient has                            taken no previous anticoagulant or antiplatelet                            agents. ASA Grade Assessment: II - A patient with                            mild systemic disease. After reviewing the risks                            and benefits, the patient was deemed in  satisfactory condition to undergo the procedure.                           After obtaining informed consent, the colonoscope                            was passed under direct vision. Throughout the                            procedure, the patient's blood pressure, pulse, and                            oxygen saturations were monitored continuously. The                            Colonoscope was introduced through the anus and                             advanced to the the cecum, identified by                            appendiceal orifice and ileocecal valve. The                            colonoscopy was performed without difficulty. The                            patient tolerated the procedure well. The quality                            of the bowel preparation was good. The ileocecal                            valve, appendiceal orifice, and rectum were                            photographed. Scope In: 3:11:13 PM Scope Out: 3:30:44 PM Scope Withdrawal Time: 0 hours 6 minutes 20 seconds  Total Procedure Duration: 0 hours 19 minutes 31 seconds  Findings:                 Three sessile polyps were found in the ascending                            colon and cecum. The polyps were 3 to 5 mm in size.                            These polyps were removed with a cold snare.                            Resection and retrieval were complete.                           External and internal hemorrhoids were found. The  hemorrhoids were small.                           The exam was otherwise without abnormality on                            direct and retroflexion views. Complications:            No immediate complications. Estimated blood loss:                            None. Estimated Blood Loss:     Estimated blood loss: none. Impression:               - Three 3 to 5 mm polyps in the ascending colon and                            in the cecum, removed with a cold snare. Resected                            and retrieved.                           - External and internal hemorrhoids.                           - The examination was otherwise normal on direct                            and retroflexion views. Recommendation:           - Patient has a contact number available for                            emergencies. The signs and symptoms of potential                            delayed  complications were discussed with the                            patient. Return to normal activities tomorrow.                            Written discharge instructions were provided to the                            patient.                           - Resume previous diet.                           - Continue present medications. Stay on once daily                            fiber supplement, this seems to be really helping  your bowels.                           You will receive a letter within 2-3 weeks with the                            pathology results and my final recommendations.                           If the polyp(s) is proven to be 'pre-cancerous' on                            pathology, you will need repeat colonoscopy in 3-5                            years. If the polyp(s) is NOT 'precancerous' on                            pathology then you should repeat colon cancer                            screening in 10 years with colonoscopy without need                            for colon cancer screening by any method prior to                            then (including stool testing). Milus Banister, MD 07/11/2017 3:33:52 PM This report has been signed electronically.

## 2017-07-12 ENCOUNTER — Telehealth: Payer: Self-pay | Admitting: *Deleted

## 2017-07-12 NOTE — Telephone Encounter (Signed)
  Follow up Call-  Call back number 07/11/2017  Post procedure Call Back phone  # [phone]  Permission to leave phone message Yes  Some recent data might be hidden     Patient questions:  Do you have a fever, pain , or abdominal swelling? No. Pain Score  0 *  Have you tolerated food without any problems? Yes.    Have you been able to return to your normal activities? Yes.    Do you have any questions about your discharge instructions: Diet   No. Medications  No. Follow up visit  No.  Do you have questions or concerns about your Care? No.  Actions: * If pain score is 4 or above: No action needed, pain <4.

## 2017-07-20 ENCOUNTER — Encounter: Payer: Self-pay | Admitting: Gastroenterology

## 2017-07-25 ENCOUNTER — Ambulatory Visit: Payer: PPO | Admitting: Gastroenterology

## 2017-07-31 ENCOUNTER — Encounter: Payer: Self-pay | Admitting: Internal Medicine

## 2017-08-01 ENCOUNTER — Telehealth: Payer: Self-pay

## 2017-08-01 ENCOUNTER — Encounter: Payer: Medicare Other | Admitting: Gastroenterology

## 2017-08-01 MED ORDER — LEVOTHYROXINE SODIUM 125 MCG PO TABS: 125.0000 ug | ORAL_TABLET | Freq: Every day | ORAL | 3 refills | Status: DC

## 2017-08-01 NOTE — Telephone Encounter (Signed)
Per Dr. Bubba Camp patient should be taking Levothyroxine 125 mcg daily. Spoke with the patient and she is aware of the instructions. A new script has been sent to the pharmacy on file and the medication list has been updated.

## 2017-08-01 NOTE — Telephone Encounter (Signed)
Spoke with the patient and she stated that she was instructed to take the Levothyroxine 125 mcg daily instead of her alternating dose. There's a note dated 05/10/17 stating that she should take the Levothyroxine 125 mcg daily for now instead of the alternating dose. Patient had her TSH checked on 05/31/17 where you wanted her to continue her current regimen. Is that the  125 mcg daily or the 125 mcg Monday, Wednesday and Friday with 112 mcg on alternating days. Please advise

## 2017-08-06 ENCOUNTER — Encounter (HOSPITAL_COMMUNITY): Payer: Self-pay | Admitting: Emergency Medicine

## 2017-08-06 ENCOUNTER — Other Ambulatory Visit: Payer: Self-pay

## 2017-08-06 ENCOUNTER — Ambulatory Visit (HOSPITAL_COMMUNITY)
Admission: EM | Admit: 2017-08-06 | Discharge: 2017-08-06 | Disposition: A | Discharge status: Home / Self Care | Payer: Medicare Other | Attending: Emergency Medicine | Admitting: Emergency Medicine

## 2017-08-06 DIAGNOSIS — W19XXXA Unspecified fall, initial encounter: Secondary | ICD-10-CM

## 2017-08-06 DIAGNOSIS — S0181XA Laceration without foreign body of other part of head, initial encounter: Secondary | ICD-10-CM | POA: Diagnosis not present

## 2017-08-06 NOTE — Discharge Instructions (Signed)
Please return in 7 to 10 days to have sutures removed, you have 8  Please keep clean and dry, wash with warm soapy water twice daily.  Please dry well afterwards.  Watch out for signs of infection including redness, increased swelling, drainage or increased pain  Please take Tylenol for the pain in your side, if you develop worsening pain or difficulty breathing please return. Please also return if you develop dizziness, lightheadedness, nausea, vomiting, changes in vision, weakness on one side.,  Difficulty speaking.

## 2017-08-06 NOTE — ED Triage Notes (Signed)
Patient tripped over her own feet and fell on concrete today.  No loc.  Family member reports patient is acting normal .  Patient is laughing and joking

## 2017-08-07 DIAGNOSIS — S0181XA Laceration without foreign body of other part of head, initial encounter: Secondary | ICD-10-CM | POA: Diagnosis not present

## 2017-08-07 NOTE — ED Provider Notes (Signed)
[facility]    CSN: [id number] Arrival date & time: 08/06/17  1832     History   Chief Complaint Chief Complaint  Patient presents with  . Fall    HPI AVERYANNA [name] is a 77 y.o. female history of hypothyroidism, hyperlipidemia, presenting today for evaluation after a fall.  Patient was walking outside earlier today, she believes she tripped on her feet and lost balance and fell forward, landed on concrete hit right forehead as well as right side of body.  She denies loss of consciousness.  She states that she has some mild pain in her wrist as well as the right side, lower ribs.  States that these pains are not too bad.  Does admit to pain worsening with taking deep breaths as well as lying flat.  Denies any nausea or vomiting, abdominal pain.  Denies shortness of breath or chest pain.  Denies dizziness or lightheadedness.  Denies difficulty speaking or weakness.  Family member with patient states that she has been acting normal, denies changes from baseline.  Denies anti-coagulation use.  HPI  Past Medical History:  Diagnosis Date  . Anemia 12/2015  . Cataract   . Hearing loss 05/03/2016  . High cholesterol   . Osteopenia   . Pericarditis 01/1999  . Pneumothorax 01/1999  . Thyroid disorder   . Vitamin B 12 deficiency     Patient Active Problem List   Diagnosis Date Noted  . Iron deficiency 04/26/2017  . Lichen sclerosus 11/94/1740  . External hemorrhoid 04/26/2017  . Weakness of left leg 06/07/2016  . Balance disorder 05/24/2016  . Memory deficit 05/24/2016  . Anemia 05/19/2016  . Hearing loss 05/03/2016  . Hypothyroidism 07/24/2013  . Bradycardia on ECG 07/24/2013  . Numbness 08/07/2012  . HYPERLIPIDEMIA 12/29/2006    Past Surgical History:  Procedure Laterality Date  . birthmark      removed,left leg  . CATARACT EXTRACTION  2017  . [place]?    OB History   None      Home Medications    Prior to Admission  medications   Medication Sig Start Date End Date Taking? Authorizing Provider  cholecalciferol (VITAMIN D) 1000 UNITS tablet Take 1,000 Units by mouth daily.    [provider]  clobetasol ointment (TEMOVATE) 8.14 % Apply 1 application topically every other day.     [provider]  conjugated estrogens (PREMARIN) vaginal cream Place 1 Applicatorful vaginally every other day.    [provider]  FERROUS SULFATE PO Take 1 tablet by mouth daily.    [provider]  hydrocortisone ointment 0.5 % Apply 1 application topically 2 (two) times daily. 06/07/17   Blanchie Serve, MD  l-methylfolate-B6-B12 (METANX) 3-35-2 MG TABS tablet Take 1 tablet by mouth daily.    [provider]  levothyroxine (SYNTHROID, LEVOTHROID) 125 MCG tablet Take 1 tablet (125 mcg total) by mouth daily. 08/01/17   Blanchie Serve, MD  Omega-3 Fatty Acids (FISH OIL) 1000 MG CAPS Take 1 capsule by mouth daily.     [provider]  Red Yeast Rice 600 MG TABS Take 1 tablet by mouth daily.     [provider]    Family History Family History  Problem Relation Age of Onset  . Diabetes Mother   . Asthma Mother   . Ataxia Father   . Cancer - Cervical Sister   . Thyroid disease Sister   . Diabetes Other   . Diabetes  Maternal Grandmother   . Arthritis Paternal Grandmother   . Colon cancer Neg Hx     Social History Social History   Tobacco Use  . Smoking status: Never Smoker  . Smokeless tobacco: Never Used  Substance Use Topics  . Alcohol use: Yes    Alcohol/week: 1.8 oz    Types: 3 Glasses of wine per week  . Drug use: No     Allergies   Atorvastatin and Rosuvastatin   Review of Systems Review of Systems  Constitutional: Negative for activity change and appetite change.  HENT: Negative for trouble swallowing.   Eyes: Negative for pain and visual disturbance.  Respiratory: Negative for shortness of breath.   Cardiovascular: Negative for chest pain.    Gastrointestinal: Negative for abdominal pain, nausea and vomiting.  Musculoskeletal: Positive for myalgias. Negative for arthralgias, back pain, gait problem, neck pain and neck stiffness.  Skin: Positive for color change and wound.  Neurological: Positive for headaches. Negative for dizziness, seizures, syncope, weakness, light-headedness and numbness.     Physical Exam Triage Vital Signs ED Triage Vitals  Enc Vitals Group     BP 08/06/17 1913 (!) 150/77     Pulse Rate 08/06/17 1913 (!) 57     Resp 08/06/17 1913 18     Temp 08/06/17 1913 98.2 F (36.8 C)     Temp Source 08/06/17 1913 Oral     SpO2 08/06/17 1913 97 %     Weight --      Height --      Head Circumference --      Peak Flow --      Pain Score 08/06/17 1911 1     Pain Loc --      Pain Edu? --      Excl. in [place]? --    No data found.  Updated Vital Signs BP (!) 150/77 (BP Location: Left Arm)   Pulse (!) 57   Temp 98.2 F (36.8 C) (Oral)   Resp 18   SpO2 97%   Visual Acuity Right Eye Distance:   Left Eye Distance:   Bilateral Distance:    Right Eye Near:   Left Eye Near:    Bilateral Near:     Physical Exam  Constitutional: She is oriented to person, place, and time. She appears well-developed and well-nourished. No distress.  HENT:  Head: Normocephalic and atraumatic.  Mouth/Throat: Oropharynx is clear and moist.  No hemotympanum  Eyes: Pupils are equal, round, and reactive to light. Conjunctivae and EOM are normal.  Neck: Neck supple.  Cardiovascular: Normal rate and regular rhythm.  No murmur heard. Pulmonary/Chest: Effort normal and breath sounds normal. No respiratory distress.  Tenderness to palpation of right lower ribs, mid axillary line.  Breathing comfortably at rest, breath sounds present throughout all lung fields, no adventitious sounds auscultated, CTA BL  Abdominal: Soft. There is no tenderness.  Musculoskeletal: She exhibits no edema.  Right wrist: Nontender to palpation of  distal radius and ulna, nontender over carpals or anatomical snuffbox, patient has erythema and tenderness over the thenar eminence, full active range of motion of wrist.  Neurological: She is alert and oriented to person, place, and time.  Patient A&O x3, cranial nerves II-XII grossly intact, strength at shoulders, hips and knees 5/5, equal bilaterally. Gait without abnormality.   Skin: Skin is warm and dry.   1/2 cm laceration to right forehead just superior to right eyebrow; bleeding controlled.  Approximately 3 mm deep.  No foreign  bodies visualized.  Psychiatric: She has a normal mood and affect.  Nursing note and vitals reviewed.    UC Treatments / Results  Labs (all labs ordered are listed, but only abnormal results are displayed) Labs Reviewed - No data to display  EKG None  Radiology No results found.  Procedures Laceration Repair Date/Time: 08/07/2017 9:16 AM Performed by: Wieters, [place] C, PA-C Authorized by: Wieters, Elesa Hacker, PA-C   Consent:    Consent obtained:  Verbal   Consent given by:  Patient   Risks discussed:  Infection, pain and poor cosmetic result   Alternatives discussed:  No treatment Anesthesia (see MAR for exact dosages):    Anesthesia method:  Local infiltration   Local anesthetic:  Lidocaine 2% WITH epi Laceration details:    Location:  Face   Face location:  R eyebrow   Length (cm):  2.5   Depth (mm):  3 Repair type:    Repair type:  Simple Pre-procedure details:    Preparation:  Patient was prepped and draped in usual sterile fashion Exploration:    Hemostasis achieved with:  Direct pressure   Wound exploration: wound explored through full range of motion     Wound extent: no foreign bodies/material noted, no muscle damage noted and no underlying fracture noted     Contaminated: no   Treatment:    Area cleansed with:  Soap and water   Amount of cleaning:  Standard   Irrigation solution:  Sterile saline and tap water   Irrigation  volume:  60 mL   Irrigation method:  Syringe   Visualized foreign bodies/material removed: no   Skin repair:    Repair method:  Sutures   Suture size:  6-0   Suture material:  Prolene   Suture technique:  Simple interrupted   Number of sutures:  8 Approximation:    Approximation:  Loose Post-procedure details:    Dressing:  Open (no dressing)   Patient tolerance of procedure:  Tolerated well, no immediate complications   (including critical care time)  Medications Ordered in UC Medications - No data to display  Initial Impression / Assessment and Plan / UC Course  I have reviewed the triage vital signs and the nursing notes.  Pertinent labs & imaging results that were available during my care of the patient were reviewed by me and considered in my medical decision making (see chart for details).     Patient after fall, no focal neuro deficits.  Laceration repaired without complications.  Discussed wound care and signs of infection.  Return in 10 days to have sutures removed.  Patient likely has a wrist sprain, she does not feel she needs an x-ray of this.  Offered x-ray of chest to check for rib fractures on right side, patient declined given her fractures heal spontaneously.  Advised of cautions with rib fractures regarding pneumonia as well as possible pneumothorax if fracture displaced.  Advised to return if she has worsening pain, develops fever, cough or shortness of breath.  Tylenol for pain.Discussed strict return precautions. Patient verbalized understanding and is agreeable with plan.  Final Clinical Impressions(s) / UC Diagnoses   Final diagnoses:  Facial laceration, initial encounter  Fall, initial encounter     Discharge Instructions     Please return in 7 to 10 days to have sutures removed, you have 8  Please keep clean and dry, wash with warm soapy water twice daily.  Please dry well afterwards.  Watch out for signs of  infection including redness, increased  swelling, drainage or increased pain  Please take Tylenol for the pain in your side, if you develop worsening pain or difficulty breathing please return. Please also return if you develop dizziness, lightheadedness, nausea, vomiting, changes in vision, weakness on one side.,  Difficulty speaking.   ED Prescriptions    None     Controlled Substance Prescriptions [place] Controlled Substance Registry consulted? Not Applicable   Janith Lima, [place] 08/07/17 [phone]

## 2017-08-16 ENCOUNTER — Non-Acute Institutional Stay: Payer: Medicare Other | Admitting: Internal Medicine

## 2017-08-16 ENCOUNTER — Encounter: Payer: Self-pay | Admitting: Internal Medicine

## 2017-08-16 VITALS — BP 116/62 | HR 65 | Temp 97.7°F | Resp 18 | Ht 65.0 in | Wt 154.6 lb

## 2017-08-16 DIAGNOSIS — W19XXXD Unspecified fall, subsequent encounter: Secondary | ICD-10-CM | POA: Diagnosis not present

## 2017-08-16 DIAGNOSIS — R0789 Other chest pain: Secondary | ICD-10-CM

## 2017-08-16 DIAGNOSIS — S0181XD Laceration without foreign body of other part of head, subsequent encounter: Secondary | ICD-10-CM

## 2017-08-16 NOTE — Progress Notes (Signed)
Location:  [facility] of Service:  Clinic (12) Provider:  Blanchie Serve, MD  Blanchie Serve, MD  Patient Care Team: Blanchie Serve, MD as PCP - General (Internal Medicine) Ronald Lobo, MD as Consulting Physician (Gastroenterology) Katy Apo, MD as Consulting Physician (Ophthalmology) Martinique, Amy, MD as Consulting Physician (Dermatology)  Extended Emergency Contact Information Primary Emergency Contact: Kinslow,Nicholas Address: [address] [address] [place] Phone: [phone] Mobile Phone: [phone] Relation: Son Secondary Emergency Contact: Snavely,Celia Address: [address]          [place], [place] [address] Johnnette Litter of [place] Phone: [phone] Mobile Phone: [phone] Relation: Friend   Goals of care: Advanced Directive information Advanced Directives 08/16/2017  Does Patient Have a Medical Advance Directive? Yes  Type of Advance Directive Living will;Healthcare Power of Attorney  Does patient want to make changes to medical advance directive? No - Patient declined  Copy of [place] in Chart? Yes     Chief Complaint  Patient presents with  . Acute Visit    ED follow up    HPI:  Pt is a 77 y.o. female seen today for an acute visit for ED follow up. She was in the ED on 08/06/17 post fall after losing balance, no LOC. She sustained laceration to right eyebrow of 2.5 cm x 3 mm and underwent surgical repair with simple interrupted suture 8 suture placed per ED note. She had pain to her wrist and chest area on right side but refused xray to assess further.She has history of hypothyroidism and hyperlipidemia. Is seen in clinic today. She denies being lightheaded prior to fall. She confirms not losing consciousness. Denies any headache today. Has pain to right chest wall with deep breathing and some discomfort to right arm, also some to right hip area. Pain has  subsided and improved some. Taking ibuprofen 600 mg 1-4 times a day. Has not taken any ibuprofen this am. Denies any heartburn or indigestion. Denies acute pain this visit.    Past Medical History:  Diagnosis Date  . Anemia 12/2015  . Cataract   . Hearing loss 05/03/2016  . High cholesterol   . Osteopenia   . Pericarditis 01/1999  . Pneumothorax 01/1999  . Thyroid disorder   . Vitamin B 12 deficiency    Past Surgical History:  Procedure Laterality Date  . birthmark      removed,left leg  . CATARACT EXTRACTION  2017  . [place]?    Allergies  Allergen Reactions  . Atorvastatin     Muscle weakness  . Rosuvastatin     Muscle weaknes    Outpatient Encounter Medications as of 08/16/2017  Medication Sig  . cholecalciferol (VITAMIN D) 1000 UNITS tablet Take 1,000 Units by mouth daily.  . clobetasol ointment (TEMOVATE) 0.27 % Apply 1 application topically every other day.   . conjugated estrogens (PREMARIN) vaginal cream Place 1 Applicatorful vaginally every other day.  Marland Kitchen FERROUS SULFATE PO Take 1 tablet by mouth daily.  Marland Kitchen l-methylfolate-B6-B12 (METANX) 3-35-2 MG TABS tablet Take 1 tablet by mouth daily.  Marland Kitchen levothyroxine (SYNTHROID, LEVOTHROID) 125 MCG tablet Take 1 tablet (125 mcg total) by mouth daily.  . Red Yeast Rice 600 MG TABS Take 1 tablet by mouth daily.   . Omega-3 Fatty Acids (FISH OIL) 1000 MG CAPS Take 1 capsule by mouth daily.   . [DISCONTINUED] hydrocortisone  ointment 0.5 % Apply 1 application topically 2 (two) times daily. (Patient not taking: Reported on 08/16/2017)  . [DISCONTINUED] 0.9 %  sodium chloride infusion    No facility-administered encounter medications on file as of 08/16/2017.     Review of Systems  HENT: Negative for congestion, rhinorrhea and trouble swallowing.   Respiratory: Negative for cough and shortness of breath.        Deep breathing causes pain to right chest wall area  Cardiovascular: Negative for chest pain  and palpitations.  Gastrointestinal: Negative for abdominal pain, nausea and vomiting.  Genitourinary: Negative for dysuria and frequency.  Musculoskeletal: Positive for arthralgias. Negative for back pain.       Complaints to ache to her right arm and chest wall area. Also has some discomfort to right hip. Denies right wrist pain.   Skin: Negative for rash.       Resolving bruise and ecchymosis to right face.   Neurological: Negative for dizziness, weakness, numbness and headaches.  Psychiatric/Behavioral: Negative for confusion, hallucinations and sleep disturbance.    Immunization History  Administered Date(s) Administered  . Influenza Whole 12/29/2006  . Influenza, High Dose Seasonal PF 11/21/2016  . Influenza,inj,Quad PF,6+ Mos 12/04/2013, 11/12/2014, 11/18/2015  . Influenza-Unspecified 12/02/2011, 11/15/2012, 12/02/2016  . PPD Test 01/05/2015  . Pneumococcal Conjugate-13 08/19/2014  . Pneumococcal Polysaccharide-23 11/18/2015  . Td 06/02/2008  . Zoster 03/08/2011  . Zoster Recombinat (Shingrix) 01/06/2016, 05/02/2016   Pertinent  Health Maintenance Due  Topic Date Due  . INFLUENZA VACCINE  10/05/2017  . COLONOSCOPY  07/11/2020  . DEXA SCAN  Completed  . PNA vac Low Risk Adult  Completed   Fall Risk  03/29/2017 11/23/2015 11/18/2015 11/02/2015 01/05/2015  Falls in the past year? No No No No Yes  Number falls in past yr: - - - - 2 or more  Injury with Fall? - - - - No  Comment - - - - -   Functional Status Survey:    Vitals:   08/16/17 0942  BP: 116/62  Pulse: 65  Resp: 18  Temp: 97.7 F (36.5 C)  TempSrc: Oral  SpO2: 96%  Weight: 154 lb 9.6 oz (70.1 kg)  Height: 5\' 5"  (1.651 m)   Body mass index is 25.73 kg/m. Physical Exam  Constitutional: She is oriented to person, place, and time. She appears well-developed and well-nourished. No distress.  HENT:  Head: Normocephalic and atraumatic.  Right Ear: External ear normal.  Left Ear: External ear normal.  Nose:  Nose normal.  Mouth/Throat: Oropharynx is clear and moist.  Eyes: Pupils are equal, round, and reactive to light. Conjunctivae and EOM are normal. Right eye exhibits no discharge. Left eye exhibits no discharge.  Neck: Normal range of motion. Neck supple.  Cardiovascular: Normal rate and regular rhythm.  Pulmonary/Chest: Effort normal and breath sounds normal.  Abdominal: Soft. Bowel sounds are normal.  Musculoskeletal:  Able to move all 4 extremities, normal ROM with wrist and shoulder. Right chest wall lateral area tenderness with deep palpation, no tenderness to light touch, no bruise to chest wall area noted  Neurological: She is alert and oriented to person, place, and time.  Skin: Skin is warm and dry. She is not diaphoretic.  Resolving bruise to right periorbital, nose bridge and left periorbital area, 8 sutures to right forehead/ brow area- after removal of suture wound has healed with scab formation  Psychiatric: She has a normal mood and affect. Her behavior is normal.    Labs reviewed:  Recent Labs    04/27/17 04/27/17 0815  NA 140 140  K 4.3 4.3  CL  --  105  CO2  --  29  GLUCOSE  --  95  BUN 13 13  CREATININE 0.61 0.61  CALCIUM 9.0 9.0   Recent Labs    04/27/17 04/27/17 0815  AST 17 17  ALT 16 16  ALKPHOS 64  --   BILITOT 0.6 0.6  PROT 6.3 6.3  ALBUMIN 3.9  --    Recent Labs    01/16/17 0000 04/27/17 04/27/17 0815  WBC 7.5 7.6 7.6  NEUTROABS  --   --  4,773  HGB 13.1 13.1 13.1  HCT 38.2 37.7 37.7  MCV 93.4  --  94.0  PLT 275  --  270   Lab Results  Component Value Date   TSH 1.92 05/31/2017   No results found for: HGBA1C Lab Results  Component Value Date   CHOL 248 (H) 01/16/2017   HDL 57 01/16/2017   LDLCALC 165 (H) 01/16/2017   LDLDIRECT 135.4 12/26/2006   TRIG 127 01/16/2017   CHOLHDL 4.4 01/16/2017    Significant Diagnostic Results in last 30 days:  No results found.  Assessment/Plan  1. Laceration of forehead without complication,  subsequent encounter S/p suture removal - 8 sutures today. Laceration has healed. No signs of infection in periwound area  2. Fall, subsequent encounter Post loss of balance. Currently symptom free. Slow position change encouraged. Stable vital signs on chart review today.   3. Right-sided chest wall pain Musculoskeletal, improved, ibuprofen 600 mg q8h prn but advised to take it with food. Mild reproducible tenderness on exam. Advised on deep breathing exercise as tolerated to prevent atelectasis.     Family/ staff Communication: reviewed care plan with patient    Labs/tests ordered:  None  Has routine follow up appointment. Will keep that. I have reviewed ED notes  Blanchie Serve, MD Internal Medicine [place], [place] [address] Cell Phone (Monday-Friday 8 am - 5 pm): [phone] On Call: [phone] and follow prompts after 5 pm and on weekends Office Phone: [phone] Office Fax: [phone]

## 2017-09-05 ENCOUNTER — Ambulatory Visit
Admission: RE | Admit: 2017-09-05 | Discharge: 2017-09-05 | Disposition: A | Discharge status: Home / Self Care | Payer: Medicare Other | Source: Ambulatory Visit | Attending: Internal Medicine | Admitting: Internal Medicine

## 2017-09-05 DIAGNOSIS — M810 Age-related osteoporosis without current pathological fracture: Secondary | ICD-10-CM | POA: Diagnosis not present

## 2017-09-05 DIAGNOSIS — M8588 Other specified disorders of bone density and structure, other site: Secondary | ICD-10-CM | POA: Diagnosis not present

## 2017-09-05 DIAGNOSIS — E2839 Other primary ovarian failure: Secondary | ICD-10-CM

## 2017-09-05 DIAGNOSIS — Z780 Asymptomatic menopausal state: Secondary | ICD-10-CM | POA: Diagnosis not present

## 2017-09-11 ENCOUNTER — Non-Acute Institutional Stay: Payer: Medicare Other | Admitting: Internal Medicine

## 2017-09-11 ENCOUNTER — Encounter: Payer: Self-pay | Admitting: Internal Medicine

## 2017-09-11 VITALS — BP 126/70 | HR 67 | Temp 97.6°F | Resp 18 | Ht 65.0 in | Wt 153.2 lb

## 2017-09-11 DIAGNOSIS — M816 Localized osteoporosis [Lequesne]: Secondary | ICD-10-CM

## 2017-09-11 DIAGNOSIS — W19XXXD Unspecified fall, subsequent encounter: Secondary | ICD-10-CM

## 2017-09-11 MED ORDER — ALENDRONATE SODIUM 70 MG PO TABS: 70.0000 mg | ORAL_TABLET | ORAL | 11 refills | Status: DC

## 2017-09-11 MED ORDER — CALCIUM CARBONATE 1250 (500 CA) MG PO CHEW: 1.0000 | CHEWABLE_TABLET | Freq: Every day | ORAL | 3 refills | Status: DC

## 2017-09-11 NOTE — Progress Notes (Signed)
Location:  [facility] of Service:  Clinic (12) Provider:  Blanchie Serve, MD  Blanchie Serve, MD  Patient Care Team: Blanchie Serve, MD as PCP - General (Internal Medicine) Ronald Lobo, MD as Consulting Physician (Gastroenterology) Katy Apo, MD as Consulting Physician (Ophthalmology) Martinique, Amy, MD as Consulting Physician (Dermatology)  Extended Emergency Contact Information Primary Emergency Contact: Soldo,Nicholas Address: [address] [place] [address] [place] Phone: [phone] Mobile Phone: [phone] Relation: Son Secondary Emergency Contact: Snavely,Celia Address: [address]          [place],  [address] Johnnette Litter of [place] Phone: [phone] Mobile Phone: [phone] Relation: Friend   Goals of care: Advanced Directive information Advanced Directives 08/16/2017  Does Patient Have a Medical Advance Directive? Yes  Type of Advance Directive Living will;Healthcare Power of Attorney  Does patient want to make changes to medical advance directive? No - Patient declined  Copy of [place] in Chart? Yes     Chief Complaint  Patient presents with  . Acute Visit    fall, abnormal dexa scan discussion  . Medication Refill    No refills needed at this time.    HPI:  Pt is a 77 y.o. female seen today for an acute visit for abnormal dexa scan. She had dexa scan showing T score -2.6 at right femur neck. She is not on any treatment for osteoporosis. Pt mentions being diagnosed with osteopenia more than 10 years back and being on fosamax for about 2 years that she self stopped reading the side effect profile. She tolerated the medication well. She has been on vitamin d supplement but does not take any calcium supplement. Denies any heartburn or reflux symptom. She mentions having a fall last week after missing on a step in a curb. She fell on her side and hurt her chest  wall. She was in urgent care and they felt she had rib fracture, no imaging were performed. Complaints of some soreness to chest wall, no pain.    Past Medical History:  Diagnosis Date  . Anemia 12/2015  . Cataract   . Hearing loss 05/03/2016  . High cholesterol   . Osteopenia   . Pericarditis 01/1999  . Pneumothorax 01/1999  . Thyroid disorder   . Vitamin B 12 deficiency    Past Surgical History:  Procedure Laterality Date  . birthmark      removed,left leg  . CATARACT EXTRACTION  2017  . [place]?    Allergies  Allergen Reactions  . Atorvastatin     Muscle weakness  . Rosuvastatin     Muscle weaknes    Outpatient Encounter Medications as of 09/11/2017  Medication Sig  . cholecalciferol (VITAMIN D) 1000 UNITS tablet Take 2,000 Units by mouth daily.   . clobetasol ointment (TEMOVATE) 1.24 % Apply 1 application topically every other day.   . conjugated estrogens (PREMARIN) vaginal cream Place 1 Applicatorful vaginally every other day.  Marland Kitchen FERROUS SULFATE PO Take 1 tablet by mouth daily.  Marland Kitchen l-methylfolate-B6-B12 (METANX) 3-35-2 MG TABS tablet Take 1 tablet by mouth daily.  Marland Kitchen levothyroxine (SYNTHROID, LEVOTHROID) 125 MCG tablet Take 1 tablet (125 mcg total) by mouth daily.  . Red Yeast Rice 600 MG TABS Take 1 tablet by mouth daily.   Marland Kitchen alendronate (FOSAMAX) 70 MG tablet Take 1 tablet (70 mg total) by mouth every 7 (seven) days.  Take with a full glass of water on an empty stomach.  . calcium carbonate (OS-CAL) 1250 (500 Ca) MG chewable tablet Chew 1 tablet (1,250 mg total) by mouth daily.  . Omega-3 Fatty Acids (FISH OIL) 1000 MG CAPS Take 1 capsule by mouth daily.    No facility-administered encounter medications on file as of 09/11/2017.     Review of Systems  Constitutional: Negative for appetite change, fatigue and fever.  HENT: Negative for congestion.   Respiratory: Negative for shortness of breath.   Cardiovascular: Negative for chest pain.   Musculoskeletal: Positive for gait problem.       Uses a hiking stick to provide support while walking. Has had 2 falls in last 1 month  Neurological: Negative for weakness.    Immunization History  Administered Date(s) Administered  . Influenza Whole 12/29/2006  . Influenza, High Dose Seasonal PF 11/21/2016  . Influenza,inj,Quad PF,6+ Mos 12/04/2013, 11/12/2014, 11/18/2015  . Influenza-Unspecified 12/02/2011, 11/15/2012, 12/02/2016  . PPD Test 01/05/2015  . Pneumococcal Conjugate-13 08/19/2014  . Pneumococcal Polysaccharide-23 11/18/2015  . Td 06/02/2008  . Zoster 03/08/2011  . Zoster Recombinat (Shingrix) 01/06/2016, 05/02/2016   Pertinent  Health Maintenance Due  Topic Date Due  . INFLUENZA VACCINE  10/05/2017  . COLONOSCOPY  07/11/2020  . DEXA SCAN  Completed  . PNA vac Low Risk Adult  Completed   Fall Risk  03/29/2017 11/23/2015 11/18/2015 11/02/2015 01/05/2015  Falls in the past year? No No No No Yes  Number falls in past yr: - - - - 2 or more  Injury with Fall? - - - - No  Comment - - - - -   Functional Status Survey:    Vitals:   09/11/17 1530  BP: 126/70  Pulse: 67  Resp: 18  Temp: 97.6 F (36.4 C)  TempSrc: Oral  SpO2: 98%  Weight: 153 lb 3.2 oz (69.5 kg)  Height: 5\' 5"  (1.651 m)   Body mass index is 25.49 kg/m.   Physical Exam  Constitutional: She is oriented to person, place, and time. She appears well-developed and well-nourished. No distress.  HENT:  Head: Normocephalic and atraumatic.  Mouth/Throat: Oropharynx is clear and moist.  Eyes: Conjunctivae and EOM are normal.  Neck: Neck supple.  Cardiovascular: Normal rate and regular rhythm.  Pulmonary/Chest: Effort normal and breath sounds normal. She exhibits no tenderness.  Abdominal: Soft. Bowel sounds are normal.  Musculoskeletal: Normal range of motion. She exhibits no edema.  Normal gait  Neurological: She is alert and oriented to person, place, and time.  Skin: She is not diaphoretic.     Labs reviewed: Recent Labs    04/27/17 04/27/17 0815  NA 140 140  K 4.3 4.3  CL  --  105  CO2  --  29  GLUCOSE  --  95  BUN 13 13  CREATININE 0.61 0.61  CALCIUM 9.0 9.0   Recent Labs    04/27/17 04/27/17 0815  AST 17 17  ALT 16 16  ALKPHOS 64  --   BILITOT 0.6 0.6  PROT 6.3 6.3  ALBUMIN 3.9  --    Recent Labs    01/16/17 0000 04/27/17 04/27/17 0815  WBC 7.5 7.6 7.6  NEUTROABS  --   --  4,773  HGB 13.1 13.1 13.1  HCT 38.2 37.7 37.7  MCV 93.4  --  94.0  PLT 275  --  270   Lab Results  Component Value Date   TSH 1.92 05/31/2017   No results found  for: HGBA1C Lab Results  Component Value Date   CHOL 248 (H) 01/16/2017   HDL 57 01/16/2017   LDLCALC 165 (H) 01/16/2017   LDLDIRECT 135.4 12/26/2006   TRIG 127 01/16/2017   CHOLHDL 4.4 01/16/2017    Significant Diagnostic Results in last 30 days:  Dg Bone Density  Result Date: 09/05/2017 EXAM: DUAL X-RAY ABSORPTIOMETRY (DXA) FOR BONE MINERAL DENSITY IMPRESSION: Referring Physician:  Blanchie Serve PATIENT: Name: Jessica [name], Jessica [name] Patient ID: [id number] Birth Date: 08/02/1940 Height: 63.0 in. Sex: Female Measured: 09/05/2017 Weight: 157.8 lbs. Indications: Advanced Age, Caucasian, Hypothyroid, Levothyroxine, Postmenopausal Fractures: None Treatments: Vitamin D (E933.5) ASSESSMENT: The BMD measured at Femur Neck Right is 0.677 g/cm2 with a T-score of -2.6. This patient's diagnostic category is OSTEOPOROSIS according to [facility]) criteria. There has been a statistically significant decrease in BMD of Left hip since prior exam dated 06/03/2003. Spine was not compared to prior study due to exclusion of vertebral bodies on current exam. This scan is good quality. L-4 was excluded due to degenerative changes. Site Region Measured Date Measured Age YA BMD Significant CHANGE T-score AP Spine  L1-L3      10/05[age]         -2.4    0.877 g/cm2 DualFemur Neck Right 10/05/2017    [age]         -2.7     0.677 g/cm2 [facility]) criteria for post-menopausal, Caucasian Women: Normal       T-score at or above -1 SD Osteopenia   T-score between -1 and -2.5 SD Osteoporosis T-score at or below -2.5 SD RECOMMENDATION: 1. All patients should optimize calcium and vitamin D intake. 2. Consider FDA approved medical therapies in postmenopausal women and men aged 63 years and older, based on the following: a. A hip or vertebral (clinical or morphometric) fracture b. T- score < or = -2.5 at the femoral neck or spine after appropriate evaluation to exclude secondary causes c. Low bone mass (T-score between -1.0 and -2.5 at the femoral neck or spine) and a 10 year probability of a hip fracture > or = 3% or a 10 year probability of a major osteoporosis-related fracture > or = 20% based on the [place]-adapted WHO algorithm d. Clinician judgment and/or patient preferences may indicate treatment for people with 10-year fracture probabilities above or below these levels FOLLOW-UP: People with diagnosed cases of osteoporosis or at high risk for fracture should have regular bone mineral density tests. For patients eligible for Medicare, routine testing is allowed once every 2 years. The testing frequency can be increased to one year for patients who have rapidly progressing disease, those who are receiving or discontinuing medical therapy to restore bone mass, or have additional risk factors. I have reviewed this report and agree with the above findings. [facility] Radiology Electronically Signed   By: Margarette Canada M.D.   On: 09/05/2017 15:19    Assessment/Plan 1. Localized osteoporosis without current pathological fracture T score -2.6 compared to -1.5 in 2005 at right hip. Start fosamax 70 mg once a week and calcium carbonate 1200 mg daily. Continue vitamin d 2000 u daily. Weight bearing exercise encouraged. Fall precautions.  - alendronate (FOSAMAX) 70 MG tablet; Take 1 tablet (70 mg total) by mouth every 7 (seven)  days. Take with a full glass of water on an empty stomach.  Dispense: 4 tablet; Refill: 11 - calcium carbonate (OS-CAL) 1250 (500 Ca) MG chewable tablet; Chew 1 tablet (1,250 mg  total) by mouth daily.  Dispense: 90 tablet; Refill: 3  2. Fall, subsequent encounter PT consult to evaluate and treat with her gait and balance    Family/ staff Communication: reviewed care plan with patient.    Blanchie Serve, MD Internal Medicine [facility] [address] [place], [place] [address] Cell Phone (Monday-Friday 8 am - 5 pm): [phone] On Call: [phone] and follow prompts after 5 pm and on weekends Office Phone: [phone] Office Fax: [phone]

## 2017-09-11 NOTE — Patient Instructions (Signed)
  START TAKING FOSAMAX ONCE A WEEK empty stomach at least half an hour prior to your first meal, sit upright for half an hour after taking the medication.   Start taking calcium supplement 1200 mg total in a day to help strengthen your bone. Do weight bearing exercises. I will have you work with physical therapy team.    Osteoporosis Osteoporosis is the thinning and loss of density in the bones. Osteoporosis makes the bones more brittle, fragile, and likely to break (fracture). Over time, osteoporosis can cause the bones to become so weak that they fracture after a simple fall. The bones most likely to fracture are the bones in the hip, wrist, and spine. What are the causes? The exact cause is not known. What increases the risk? Anyone can develop osteoporosis. You may be at greater risk if you have a family history of the condition or have poor nutrition. You may also have a higher risk if you are:  Female.  76 years old or older.  A smoker.  Not physically active.  White or Asian.  Slender.  What are the signs or symptoms? A fracture might be the first sign of the disease, especially if it results from a fall or injury that would not usually cause a bone to break. Other signs and symptoms include:  Low back and neck pain.  Stooped posture.  Height loss.  How is this diagnosed? To make a diagnosis, your health care provider may:  Take a medical history.  Perform a physical exam.  Order tests, such as: ? A bone mineral density test. ? A dual-energy X-ray absorptiometry test.  How is this treated? The goal of osteoporosis treatment is to strengthen your bones to reduce your risk of a fracture. Treatment may involve:  Making lifestyle changes, such as: ? Eating a diet rich in calcium. ? Doing weight-bearing and muscle-strengthening exercises. ? Stopping tobacco use. ? Limiting alcohol intake.  Taking medicine to slow the process of bone loss or to increase bone  density.  Monitoring your levels of calcium and vitamin D.  Follow these instructions at home:  Include calcium and vitamin D in your diet. Calcium is important for bone health, and vitamin D helps the body absorb calcium.  Perform weight-bearing and muscle-strengthening exercises as directed by your health care provider.  Do not use any tobacco products, including cigarettes, chewing tobacco, and electronic cigarettes. If you need help quitting, ask your health care provider.  Limit your alcohol intake.  Take medicines only as directed by your health care provider.  Keep all follow-up visits as directed by your health care provider. This is important.  Take precautions at home to lower your risk of falling, such as: ? Keeping rooms well lit and clutter free. ? Installing safety rails on stairs. ? Using rubber mats in the bathroom and other areas that are often wet or slippery. Get help right away if: You fall or injure yourself. This information is not intended to replace advice given to you by your health care provider. Make sure you discuss any questions you have with your health care provider. Document Released: 12/01/2004 Document Revised: 07/27/2015 Document Reviewed: 08/01/2013 Elsevier Interactive Patient Education  [employer].

## 2017-09-14 DIAGNOSIS — S82099A Other fracture of unspecified patella, initial encounter for closed fracture: Secondary | ICD-10-CM | POA: Diagnosis not present

## 2017-09-14 DIAGNOSIS — I309 Acute pericarditis, unspecified: Secondary | ICD-10-CM | POA: Diagnosis not present

## 2017-09-14 DIAGNOSIS — E781 Pure hyperglyceridemia: Secondary | ICD-10-CM | POA: Diagnosis not present

## 2017-09-14 DIAGNOSIS — E039 Hypothyroidism, unspecified: Secondary | ICD-10-CM | POA: Diagnosis not present

## 2017-09-14 DIAGNOSIS — Z9181 History of falling: Secondary | ICD-10-CM | POA: Diagnosis not present

## 2017-09-14 DIAGNOSIS — R2681 Unsteadiness on feet: Secondary | ICD-10-CM | POA: Diagnosis not present

## 2017-09-15 DIAGNOSIS — S82099A Other fracture of unspecified patella, initial encounter for closed fracture: Secondary | ICD-10-CM | POA: Diagnosis not present

## 2017-09-15 DIAGNOSIS — Z9181 History of falling: Secondary | ICD-10-CM | POA: Diagnosis not present

## 2017-09-15 DIAGNOSIS — R2681 Unsteadiness on feet: Secondary | ICD-10-CM | POA: Diagnosis not present

## 2017-09-15 DIAGNOSIS — E039 Hypothyroidism, unspecified: Secondary | ICD-10-CM | POA: Diagnosis not present

## 2017-09-15 DIAGNOSIS — E781 Pure hyperglyceridemia: Secondary | ICD-10-CM | POA: Diagnosis not present

## 2017-09-15 DIAGNOSIS — I309 Acute pericarditis, unspecified: Secondary | ICD-10-CM | POA: Diagnosis not present

## 2017-09-22 DIAGNOSIS — I309 Acute pericarditis, unspecified: Secondary | ICD-10-CM | POA: Diagnosis not present

## 2017-09-22 DIAGNOSIS — R2681 Unsteadiness on feet: Secondary | ICD-10-CM | POA: Diagnosis not present

## 2017-09-22 DIAGNOSIS — E781 Pure hyperglyceridemia: Secondary | ICD-10-CM | POA: Diagnosis not present

## 2017-09-22 DIAGNOSIS — Z9181 History of falling: Secondary | ICD-10-CM | POA: Diagnosis not present

## 2017-09-22 DIAGNOSIS — E039 Hypothyroidism, unspecified: Secondary | ICD-10-CM | POA: Diagnosis not present

## 2017-09-22 DIAGNOSIS — S82099A Other fracture of unspecified patella, initial encounter for closed fracture: Secondary | ICD-10-CM | POA: Diagnosis not present

## 2017-09-28 DIAGNOSIS — Z9181 History of falling: Secondary | ICD-10-CM | POA: Diagnosis not present

## 2017-09-28 DIAGNOSIS — S82099A Other fracture of unspecified patella, initial encounter for closed fracture: Secondary | ICD-10-CM | POA: Diagnosis not present

## 2017-09-28 DIAGNOSIS — E039 Hypothyroidism, unspecified: Secondary | ICD-10-CM | POA: Diagnosis not present

## 2017-09-28 DIAGNOSIS — R2681 Unsteadiness on feet: Secondary | ICD-10-CM | POA: Diagnosis not present

## 2017-09-28 DIAGNOSIS — E781 Pure hyperglyceridemia: Secondary | ICD-10-CM | POA: Diagnosis not present

## 2017-09-28 DIAGNOSIS — I309 Acute pericarditis, unspecified: Secondary | ICD-10-CM | POA: Diagnosis not present

## 2017-09-29 DIAGNOSIS — E781 Pure hyperglyceridemia: Secondary | ICD-10-CM | POA: Diagnosis not present

## 2017-09-29 DIAGNOSIS — I309 Acute pericarditis, unspecified: Secondary | ICD-10-CM | POA: Diagnosis not present

## 2017-09-29 DIAGNOSIS — S82099A Other fracture of unspecified patella, initial encounter for closed fracture: Secondary | ICD-10-CM | POA: Diagnosis not present

## 2017-09-29 DIAGNOSIS — Z9181 History of falling: Secondary | ICD-10-CM | POA: Diagnosis not present

## 2017-09-29 DIAGNOSIS — E039 Hypothyroidism, unspecified: Secondary | ICD-10-CM | POA: Diagnosis not present

## 2017-09-29 DIAGNOSIS — R2681 Unsteadiness on feet: Secondary | ICD-10-CM | POA: Diagnosis not present

## 2017-10-18 ENCOUNTER — Encounter: Payer: Self-pay | Admitting: Internal Medicine

## 2017-10-23 ENCOUNTER — Non-Acute Institutional Stay: Payer: Medicare Other | Admitting: Internal Medicine

## 2017-10-23 ENCOUNTER — Encounter: Payer: Self-pay | Admitting: Internal Medicine

## 2017-10-23 VITALS — BP 118/62 | HR 60 | Temp 97.7°F | Resp 18 | Ht 64.0 in | Wt 153.6 lb

## 2017-10-23 DIAGNOSIS — R0981 Nasal congestion: Secondary | ICD-10-CM

## 2017-10-23 DIAGNOSIS — J0100 Acute maxillary sinusitis, unspecified: Secondary | ICD-10-CM

## 2017-10-23 DIAGNOSIS — R0789 Other chest pain: Secondary | ICD-10-CM | POA: Diagnosis not present

## 2017-10-23 MED ORDER — AMOXICILLIN-POT CLAVULANATE 875-125 MG PO TABS: 1.0000 | ORAL_TABLET | Freq: Two times a day (BID) | ORAL | 0 refills | Status: DC

## 2017-10-23 MED ORDER — FLUTICASONE PROPIONATE 50 MCG/ACT NA SUSP: 1.0000 | Freq: Every day | NASAL | 2 refills | Status: DC

## 2017-10-23 NOTE — Progress Notes (Signed)
Jessica [name]  Provider: Blanchie Serve MD   Location:  [place] of Service:  [name] (12)  PCP: Jessica Serve, MD Patient Care Team: Jessica Serve, MD as PCP - General (Internal Medicine) Jessica Lobo, MD as Consulting Physician (Gastroenterology) Jessica Apo, MD as Consulting Physician (Ophthalmology) Martinique, Amy, MD as Consulting Physician (Dermatology)  Extended Emergency Contact Information Primary Emergency Contact: Jessica [name] Address: [address] [place] [address] [place] Phone: [phone] Mobile Phone: [phone] Relation: Son Secondary Emergency Contact: Jessica [name] Address: [address]          [place], [place] [address] Jessica [name] of [place] Phone: [phone] Mobile Phone: [phone] Relation: Friend  Goals of Care: Advanced Directive information Advanced Directives 10/23/2017  Does Patient Have a Medical Advance Directive? Yes  Type of Paramedic of [place];Living will  Does patient want to make changes to medical advance directive? No - Patient declined  Copy of [place] in Chart? Yes     Chief Complaint  Patient presents with  . Acute Visit    Stuffyness to left ear x 1 week after plan trip.   . Medication Refill    No refills needed at this time    HPI: Patient is a 77 y.o. female seen today for acute visit. She was landing from a flight a week ago and since then she has stuffiness to left ear. Denies earache or discharge. Has tinnitus but this is chronic. She feels congested to her nose but denies chest congestion. Denies cough, fever or chills. She complaints of pain to her chest wall for a week now. Denies fall or trauma. Mentions doing some hiking 2 weeks back and having to use a cane coming downhill where she had to apply extra pressure for balance.   Past Medical History:  Diagnosis Date  . Anemia  12/2015  . Cataract   . Hearing loss 05/03/2016  . High cholesterol   . Osteopenia   . Pericarditis 01/1999  . Pneumothorax 01/1999  . Thyroid disorder   . Vitamin B 12 deficiency    Past Surgical History:  Procedure Laterality Date  . birthmark      removed,left leg  . CATARACT EXTRACTION  2017  . [place]?    reports that she has never smoked. She has never used smokeless tobacco. She reports that she drinks about 3.0 standard drinks of alcohol per week. She reports that she does not use drugs. Social History   Socioeconomic History  . Marital status: Divorced    Spouse name: Not on file  . Number of children: 1  . Years of education: college  . Highest education level: Not on file  Occupational History  . Occupation: retired  Scientific laboratory technician  . Financial resource strain: Not hard at all  . Food insecurity:    Worry: Never true    Inability: Never true  . Transportation needs:    Medical: No    Non-medical: No  Tobacco Use  . Smoking status: Never Smoker  . Smokeless tobacco: Never Used  Substance and Sexual Activity  . Alcohol use: Yes    Alcohol/week: 3.0 standard drinks    Types: 3 Glasses of wine per week  . Drug use: No  . Sexual activity: Not on file  Lifestyle  . Physical activity:    Days per week: 4  days    Minutes per session: 30 min  . Stress: Only a little  Relationships  . Social connections:    Talks on phone: Three times a week    Gets together: More than three times a week    Attends religious service: More than 4 times per year    Active member of club or organization: Yes    Attends meetings of clubs or organizations: Never    Relationship status: Widowed  . Intimate partner violence:    Fear of current or ex partner: No    Emotionally abused: No    Physically abused: No    Forced sexual activity: No  Other Topics Concern  . Not on file  Social History Narrative   Moved to [facility] 01/14/2015   Do  you drink/eat things with caffeine?  yes   Marital status?      divorced                               Do you live in a house, apartment, assisted living, condo, trailer, etc.?  Retirement community   Is it one or more stories? Yes, 3   How many persons live in your home?  1   Do you have any pets in your home? (please list) 0   Current or past profession:  Librarian   Do you exercise?     yes                                 Type & how often? Walk-varies   Do you have a living will? yes   Do you have a DNR form?         no                         If not, do you want to discuss one? yes   Do you have signed POA/HPOA for forms? yes    Functional Status Survey:    Family History  Problem Relation Age of Onset  . Diabetes Mother   . Asthma Mother   . Ataxia Father   . Cancer - Cervical Sister   . Thyroid disease Sister   . Diabetes Other   . Diabetes Maternal Grandmother   . Arthritis Paternal Grandmother   . Colon cancer Neg Hx     Health Maintenance  Topic Date Due  . INFLUENZA VACCINE  10/05/2017  . TETANUS/TDAP  06/03/2018  . COLONOSCOPY  07/11/2020  . DEXA SCAN  Completed  . PNA vac Low Risk Adult  Completed    Allergies  Allergen Reactions  . Atorvastatin     Muscle weakness  . Rosuvastatin     Muscle weaknes    Outpatient Encounter Medications as of 10/23/2017  Medication Sig  . alendronate (FOSAMAX) 70 MG tablet Take 1 tablet (70 mg total) by mouth every 7 (seven) days. Take with a full glass of water on an empty stomach.  . calcium carbonate (OS-CAL) 1250 (500 Ca) MG chewable tablet Chew 1 tablet (1,250 mg total) by mouth daily.  . cholecalciferol (VITAMIN D) 1000 UNITS tablet Take 2,000 Units by mouth daily.   . clobetasol ointment (TEMOVATE) 6.71 % Apply 1 application topically every other day.   Marland Kitchen FERROUS SULFATE PO Take 1 tablet by mouth daily.  Marland Kitchen l-methylfolate-B6-B12 [facility])  3-35-2 MG TABS tablet Take 1 tablet by mouth daily.  Marland Kitchen levothyroxine  (SYNTHROID, LEVOTHROID) 125 MCG tablet Take 1 tablet (125 mcg total) by mouth daily.  . Red Yeast Rice 600 MG TABS Take 1 tablet by mouth daily.   Marland Kitchen conjugated estrogens (PREMARIN) vaginal cream Place 1 Applicatorful vaginally every other day.  . Omega-3 Fatty Acids (FISH OIL) 1000 MG CAPS Take 1 capsule by mouth daily.    No facility-administered encounter medications on file as of 10/23/2017.     Review of Systems  Constitutional: Positive for fatigue. Negative for appetite change and fever.  HENT: Positive for postnasal drip. Negative for sinus pain and trouble swallowing.   Respiratory: Negative for shortness of breath and wheezing.   Cardiovascular: Negative for chest pain and palpitations.  Gastrointestinal: Negative for abdominal pain.  Neurological: Negative for dizziness, light-headedness and headaches.       Has felt some trouble with balance for about 2 weeks, no fall reported    Vitals:   10/23/17 1328  BP: 118/62  Pulse: 60  Resp: 18  Temp: 97.7 F (36.5 C)  TempSrc: Oral  SpO2: 95%  Weight: 153 lb 9.6 oz (69.7 kg)  Height: 5\' 4"  (1.626 m)   Body mass index is 26.37 kg/m. Physical Exam  Constitutional: She is oriented to person, place, and time. She appears well-developed and well-nourished. No distress.  HENT:  Head: Normocephalic and atraumatic.  Mouth/Throat: Oropharynx is clear and moist. No oropharyngeal exudate.  Cerumen to both ear, left ear canal with erythema, normal tympanic membrane, no drainage noted, swollen and erythematous nasal mucosa with dried brown mucus, maxillary sinus tenderness on exam  Eyes: Pupils are equal, round, and reactive to light. Conjunctivae and EOM are normal. Right eye exhibits no discharge. Left eye exhibits no discharge.  Neck: Normal range of motion. Neck supple.  Cardiovascular: Normal rate and regular rhythm.  Pulmonary/Chest: Effort normal and breath sounds normal. She has no wheezes. She has no rales. She exhibits  tenderness.  To right and left medial quadrant of breast area, no breast lump or mass noticed  Abdominal: Soft. Bowel sounds are normal. There is no tenderness. There is no guarding.  Musculoskeletal: Normal range of motion.  Lymphadenopathy:    She has no cervical adenopathy.  Neurological: She is alert and oriented to person, place, and time.  Skin: Skin is warm and dry. She is not diaphoretic.    Labs reviewed: Basic Metabolic Panel: Recent Labs    04/27/17 04/27/17 0815  NA 140 140  K 4.3 4.3  CL  --  105  CO2  --  29  GLUCOSE  --  95  BUN 13 13  CREATININE 0.61 0.61  CALCIUM 9.0 9.0   Liver Function Tests: Recent Labs    04/27/17 04/27/17 0815  AST 17 17  ALT 16 16  ALKPHOS 64  --   BILITOT 0.6 0.6  PROT 6.3 6.3  ALBUMIN 3.9  --    No results for input(s): LIPASE, AMYLASE in the last 8760 hours. No results for input(s): AMMONIA in the last 8760 hours. CBC: Recent Labs    01/16/17 0000 04/27/17 04/27/17 0815  WBC 7.5 7.6 7.6  NEUTROABS  --   --  4,773  HGB 13.1 13.1 13.1  HCT 38.2 37.7 37.7  MCV 93.4  --  94.0  PLT 275  --  270   Cardiac Enzymes: No results for input(s): CKTOTAL, CKMB, CKMBINDEX, TROPONINI in the last 8760 hours. BNP:  Invalid input(s): POCBNP No results found for: HGBA1C Lab Results  Component Value Date   TSH 1.92 05/31/2017   Lab Results  Component Value Date   VITAMINB12 >2000 (H) 11/23/2015   No results found for: FOLATE Lab Results  Component Value Date   IRON 78 01/16/2017   TIBC 326 01/16/2017   FERRITIN 39 04/27/2017    Lipid Panel: Recent Labs    01/16/17 0000  CHOL 248*  HDL 57  LDLCALC 165*  TRIG 127  CHOLHDL 4.4   No results found for: HGBA1C  Procedures since last visit: No results found.  Assessment/Plan  1. Nasal congestion - fluticasone (FLONASE) 50 MCG/ACT nasal spray; Place 1 spray into both nostrils daily.  Dispense: 16 g; Refill: 2  2. Acute non-recurrent maxillary sinusitis No fever  but has dried mucus to her nostril, maxillary sinus tenderness with nasal congestion and ear congestion. If no improvement clinically in next 3-4 days, advised to take a course of augmentin for 1 week.  - amoxicillin-clavulanate (AUGMENTIN) 875-125 MG tablet; Take 1 tablet by mouth 2 (two) times daily.  Dispense: 14 tablet; Refill: 0  3. Musculoskeletal chest pain Advised to take tylenol 650 mg bid prn for pain and do gentle stretching exercises. Reassess if no improvement   Labs/tests ordered:  none  Next appointment: has a follow up for physical  Communication: reviewed care plan with patient     Jessica Serve, MD Internal Medicine [place], [place] [address] Cell Phone (Monday-Friday 8 am - 5 pm): [phone] On Call: [phone] and follow prompts after 5 pm and on weekends Office Phone: [phone] Office Fax: [phone]

## 2017-10-23 NOTE — Patient Instructions (Addendum)
Take flonase nasal spray 1 spray to each nostril daily for 1 week. If no improvement noted in next 3-4 days, continue flonase but also start taking the antibiotic that I have sent to your pharmacy. If no improvement or you develop fever, notify the office right away.  Do gentle stretching exercise for your chest wall pain and take tylenol 650 mg twice a day as needed for a week.

## 2017-11-08 ENCOUNTER — Non-Acute Institutional Stay: Payer: Medicare Other | Admitting: Internal Medicine

## 2017-11-08 ENCOUNTER — Encounter: Payer: Medicare Other | Admitting: Internal Medicine

## 2017-11-08 ENCOUNTER — Encounter: Payer: Self-pay | Admitting: Internal Medicine

## 2017-11-08 VITALS — BP 126/68 | HR 60 | Temp 97.7°F | Resp 16 | Ht 64.0 in | Wt 154.4 lb

## 2017-11-08 DIAGNOSIS — R0981 Nasal congestion: Secondary | ICD-10-CM | POA: Diagnosis not present

## 2017-11-08 DIAGNOSIS — R0982 Postnasal drip: Secondary | ICD-10-CM | POA: Diagnosis not present

## 2017-11-08 DIAGNOSIS — H6121 Impacted cerumen, right ear: Secondary | ICD-10-CM

## 2017-11-08 MED ORDER — FLUTICASONE PROPIONATE 50 MCG/ACT NA SUSP: 1.0000 | Freq: Every day | NASAL | 2 refills | Status: DC | PRN

## 2017-11-08 MED ORDER — LORATADINE 10 MG PO TABS: 10.0000 mg | ORAL_TABLET | Freq: Every day | ORAL | 11 refills | Status: DC

## 2017-11-08 MED ORDER — CARBAMIDE PEROXIDE 6.5 % OT SOLN: 5.0000 [drp] | Freq: Two times a day (BID) | OTIC | 0 refills | Status: DC

## 2017-11-08 NOTE — Progress Notes (Signed)
[facility]  Provider: Blanchie Serve MD   Location:      Place of Service:     PCP: Blanchie Serve, MD Patient Care Team: Blanchie Serve, MD as PCP - General (Internal Medicine) Ronald Lobo, MD as Consulting Physician (Gastroenterology) Katy Apo, MD as Consulting Physician (Ophthalmology) Martinique, Amy, MD as Consulting Physician (Dermatology)  Extended Emergency Contact Information Primary Emergency Contact: Redd,Nicholas Address: [address] [place] [address] [place] Phone: [phone] Mobile Phone: [phone] Relation: Son Secondary Emergency Contact: Snavely,Celia Address: [address]          [place], [place] [address] Johnnette Litter of [place] Phone: [phone] Mobile Phone: [phone] Relation: Friend   Goals of Care: Advanced Directive information Advanced Directives 11/08/2017  Does Patient Have a Medical Advance Directive? Yes  Type of Paramedic of [place];Out of facility DNR (pink MOST or yellow form);Living will  Does patient want to make changes to medical advance directive? -  Copy of [place] in Chart? Yes  Pre-existing out of facility DNR order (yellow form or pink MOST form) Yellow form placed in chart (order not valid for inpatient use);Pink MOST form placed in chart (order not valid for inpatient use)      Chief Complaint  Patient presents with  . Acute Visit    ear stuffed, drainage to back of throat    HPI: Patient is a 77 y.o. female seen today for acute visit. She complaints of stuffed ear and drainage to back of her throat. She was seen last week with complain of stuffiness to left ear, nasal congestion and was empirically treated for acute sinusitis and nasal congestion. She has completed her antibiotic course, mentions the ear stuffiness has improved to left ear but now feels stuffed in her right ear. Nasal congestion  has resolved but she now has drainage to back of her throat with occasional discomfort to her throat. Denies sore throat or cough. Clear nasal discharge reported.   Past Medical History:  Diagnosis Date  . Anemia 12/2015  . Cataract   . Hearing loss 05/03/2016  . High cholesterol   . Osteopenia   . Pericarditis 01/1999  . Pneumothorax 01/1999  . Thyroid disorder   . Vitamin B 12 deficiency    Past Surgical History:  Procedure Laterality Date  . birthmark      removed,left leg  . CATARACT EXTRACTION  2017  . [place]?    reports that she has never smoked. She has never used smokeless tobacco. She reports that she drinks about 3.0 standard drinks of alcohol per week. She reports that she does not use drugs. Social History   Socioeconomic History  . Marital status: Divorced    Spouse name: Not on file  . Number of children: 1  . Years of education: college  . Highest education level: Not on file  Occupational History  . Occupation: retired  Scientific laboratory technician  . Financial resource strain: Not hard at all  . Food insecurity:    Worry: Never true    Inability: Never true  . Transportation needs:    Medical: No    Non-medical: No  Tobacco Use  . Smoking status: Never Smoker  . Smokeless tobacco: Never Used  Substance and Sexual Activity  . Alcohol use: Yes    Alcohol/week: 3.0 standard drinks    Types: 3  Glasses of wine per week  . Drug use: No  . Sexual activity: Not on file  Lifestyle  . Physical activity:    Days per week: 4 days    Minutes per session: 30 min  . Stress: Only a little  Relationships  . Social connections:    Talks on phone: Three times a week    Gets together: More than three times a week    Attends religious service: More than 4 times per year    Active member of club or organization: Yes    Attends meetings of clubs or organizations: Never    Relationship status: Widowed  . Intimate partner violence:    Fear of  current or ex partner: No    Emotionally abused: No    Physically abused: No    Forced sexual activity: No  Other Topics Concern  . Not on file  Social History Narrative   Moved to [facility] 01/14/2015   Do you drink/eat things with caffeine?  yes   Marital status?      divorced                               Do you live in a house, apartment, assisted living, condo, trailer, etc.?  Retirement community   Is it one or more stories? Yes, 3   How many persons live in your home?  1   Do you have any pets in your home? (please list) 0   Current or past profession:  Librarian   Do you exercise?     yes                                 Type & how often? Walk-varies   Do you have a living will? yes   Do you have a DNR form?         no                         If not, do you want to discuss one? yes   Do you have signed POA/HPOA for forms? yes     Family History  Problem Relation Age of Onset  . Diabetes Mother   . Asthma Mother   . Ataxia Father   . Cancer - Cervical Sister   . Thyroid disease Sister   . Diabetes Other   . Diabetes Maternal Grandmother   . Arthritis Paternal Grandmother   . Colon cancer Neg Hx     Health Maintenance  Topic Date Due  . INFLUENZA VACCINE  10/05/2017  . TETANUS/TDAP  06/03/2018  . COLONOSCOPY  07/11/2020  . DEXA SCAN  Completed  . PNA vac Low Risk Adult  Completed    Allergies  Allergen Reactions  . Atorvastatin     Muscle weakness  . Rosuvastatin     Muscle weaknes    Outpatient Encounter Medications as of 11/08/2017  Medication Sig  . alendronate (FOSAMAX) 70 MG tablet Take 1 tablet (70 mg total) by mouth every 7 (seven) days. Take with a full glass of water on an empty stomach.  . calcium carbonate (OS-CAL) 1250 (500 Ca) MG chewable tablet Chew 1 tablet (1,250 mg total) by mouth daily.  . cholecalciferol (VITAMIN D) 1000 UNITS tablet Take 2,000 Units by mouth daily.   . clobetasol ointment (TEMOVATE)  5.10 % Apply 1 application  topically every other day.   Marland Kitchen FERROUS SULFATE PO Take 1 tablet by mouth daily.  . fluticasone (FLONASE) 50 MCG/ACT nasal spray Place 1 spray into both nostrils daily as needed for allergies or rhinitis.  Marland Kitchen l-methylfolate-B6-B12 (METANX) 3-35-2 MG TABS tablet Take 1 tablet by mouth daily.  Marland Kitchen levothyroxine (SYNTHROID, LEVOTHROID) 125 MCG tablet Take 1 tablet (125 mcg total) by mouth daily.  . Red Yeast Rice 600 MG TABS Take 1 tablet by mouth daily.   . [DISCONTINUED] fluticasone (FLONASE) 50 MCG/ACT nasal spray Place 1 spray into both nostrils daily.  . carbamide peroxide (DEBROX) 6.5 % OTIC solution Place 5 drops into the right ear 2 (two) times daily.  Marland Kitchen loratadine (CLARITIN) 10 MG tablet Take 1 tablet (10 mg total) by mouth daily.  . Omega-3 Fatty Acids (FISH OIL) 1000 MG CAPS Take 1 capsule by mouth daily.   . [DISCONTINUED] amoxicillin-clavulanate (AUGMENTIN) 875-125 MG tablet Take 1 tablet by mouth 2 (two) times daily.  . [DISCONTINUED] conjugated estrogens (PREMARIN) vaginal cream Place 1 Applicatorful vaginally every other day.   No facility-administered encounter medications on file as of 11/08/2017.     Review of Systems  Constitutional: Negative for appetite change, chills, diaphoresis and fever.  HENT: Positive for postnasal drip, rhinorrhea and tinnitus. Negative for congestion, ear discharge, ear pain, mouth sores, sinus pressure and sinus pain.        Chronic tinnitus  Respiratory: Negative for cough and shortness of breath.   Cardiovascular: Negative for chest pain and palpitations.  Gastrointestinal: Negative for abdominal pain.  Skin: Negative for rash.  Neurological: Negative for dizziness, syncope and headaches.    Vitals:   11/08/17 1203  BP: 126/68  Pulse: 60  Resp: 16  Temp: 97.7 F (36.5 C)  TempSrc: Oral  Weight: 154 lb 6.4 oz (70 kg)  Height: 5\' 4"  (1.626 m)   Body mass index is 26.5 kg/m. Physical Exam  Constitutional: She is oriented to person, place,  and time. She appears well-developed and well-nourished. No distress.  HENT:  Head: Normocephalic and atraumatic.  Mouth/Throat: No oropharyngeal exudate.  Cerumen impacted right ear, some cerumen to left ear. moist mucus membrane, mild erythema to posterior pharynx, pale nasal mucosa with some swelling, clear nasal drainage  Eyes: Pupils are equal, round, and reactive to light. Conjunctivae and EOM are normal. Right eye exhibits no discharge. Left eye exhibits no discharge.  Neck: Normal range of motion. Neck supple.  Pulmonary/Chest: Effort normal and breath sounds normal.  Abdominal: Soft. Bowel sounds are normal. There is no tenderness.  Lymphadenopathy:    She has no cervical adenopathy.  Neurological: She is alert and oriented to person, place, and time.  Skin: Skin is warm and dry. She is not diaphoretic.  Psychiatric: She has a normal mood and affect.    Labs reviewed: Basic Metabolic Panel: Recent Labs    04/27/17 04/27/17 0815  NA 140 140  K 4.3 4.3  CL  --  105  CO2  --  29  GLUCOSE  --  95  BUN 13 13  CREATININE 0.61 0.61  CALCIUM 9.0 9.0   Liver Function Tests: Recent Labs    04/27/17 04/27/17 0815  AST 17 17  ALT 16 16  ALKPHOS 64  --   BILITOT 0.6 0.6  PROT 6.3 6.3  ALBUMIN 3.9  --    No results for input(s): LIPASE, AMYLASE in the last 8760 hours. No results for input(s):  AMMONIA in the last 8760 hours. CBC: Recent Labs    01/16/17 0000 04/27/17 04/27/17 0815  WBC 7.5 7.6 7.6  NEUTROABS  --   --  4,773  HGB 13.1 13.1 13.1  HCT 38.2 37.7 37.7  MCV 93.4  --  94.0  PLT 275  --  270   Cardiac Enzymes: No results for input(s): CKTOTAL, CKMB, CKMBINDEX, TROPONINI in the last 8760 hours. BNP: Invalid input(s): POCBNP No results found for: HGBA1C Lab Results  Component Value Date   TSH 1.92 05/31/2017   Lab Results  Component Value Date   VITAMINB12 >2000 (H) 11/23/2015   No results found for: FOLATE Lab Results  Component Value Date    IRON 78 01/16/2017   TIBC 326 01/16/2017   FERRITIN 39 04/27/2017    Lipid Panel: Recent Labs    01/16/17 0000  CHOL 248*  HDL 57  LDLCALC 165*  TRIG 127  CHOLHDL 4.4   No results found for: HGBA1C  Procedures since last visit: No results found.  Assessment/Plan  1. Nasal congestion Change flonase to daily as needed only for now. Add claritin - fluticasone (FLONASE) 50 MCG/ACT nasal spray; Place 1 spray into both nostrils daily as needed for allergies or rhinitis.  Dispense: 16 g; Refill: 2 - loratadine (CLARITIN) 10 MG tablet; Take 1 tablet (10 mg total) by mouth daily.  Dispense: 30 tablet; Refill: 11  2. Cerumen debris on tympanic membrane of right ear Debrox to right ear bid x 1 week followed by ear lavage, appt will be made - carbamide peroxide (DEBROX) 6.5 % OTIC solution; Place 5 drops into the right ear 2 (two) times daily.  Dispense: 15 mL; Refill: 0  3. Post-nasal drip Start claritin daily for now. Continue flonase but only as needed. monitor - fluticasone (FLONASE) 50 MCG/ACT nasal spray; Place 1 spray into both nostrils daily as needed for allergies or rhinitis.  Dispense: 16 g; Refill: 2 - loratadine (CLARITIN) 10 MG tablet; Take 1 tablet (10 mg total) by mouth daily.  Dispense: 30 tablet; Refill: 11    Labs/tests ordered:  none  Next appointment: has follow up  Communication: reviewed care plan with patient     Blanchie Serve, MD Internal Medicine [place], [place] [address] Cell Phone (Monday-Friday 8 am - 5 pm): [phone] On Call: [phone] and follow prompts after 5 pm and on weekends Office Phone: [phone] Office Fax: [phone]

## 2017-11-10 ENCOUNTER — Telehealth: Payer: Self-pay

## 2017-11-10 NOTE — Telephone Encounter (Signed)
Patient called requesting referral to ENT, patient states she is no better. Patient was seen recently for ear fullness and drainage down throat  Please advise

## 2017-11-10 NOTE — Telephone Encounter (Signed)
Will wait for Dr. Bubba Camp to advise. Message left advising patient that it would be at least Monday before the referral is placed.

## 2017-11-13 ENCOUNTER — Other Ambulatory Visit: Payer: Self-pay | Admitting: Internal Medicine

## 2017-11-13 DIAGNOSIS — R0981 Nasal congestion: Secondary | ICD-10-CM

## 2017-11-13 DIAGNOSIS — H9209 Otalgia, unspecified ear: Secondary | ICD-10-CM

## 2017-11-13 NOTE — Progress Notes (Signed)
ENT referral provided.

## 2017-11-13 NOTE — Telephone Encounter (Signed)
Referral placed.

## 2017-11-14 ENCOUNTER — Ambulatory Visit: Payer: Self-pay

## 2017-11-16 ENCOUNTER — Ambulatory Visit: Payer: Medicare Other

## 2017-11-17 ENCOUNTER — Non-Acute Institutional Stay: Payer: Medicare Other | Admitting: Family

## 2017-11-17 ENCOUNTER — Encounter: Payer: Self-pay | Admitting: Family

## 2017-11-17 VITALS — BP 120/80 | HR 60 | Temp 97.6°F | Resp 18 | Ht 64.0 in | Wt 152.0 lb

## 2017-11-17 DIAGNOSIS — H6121 Impacted cerumen, right ear: Secondary | ICD-10-CM | POA: Diagnosis not present

## 2017-11-17 NOTE — Progress Notes (Addendum)
Place of Service:  Clinic (12) Provider: Sparkles Mcneely FNP-C  Blanchie Serve, MD  Patient Care Team: Blanchie Serve, MD as PCP - General (Internal Medicine) Ronald Lobo, MD as Consulting Physician (Gastroenterology) Katy Apo, MD as Consulting Physician (Ophthalmology) Martinique, Amy, MD as Consulting Physician (Dermatology)  Extended Emergency Contact Information Primary Emergency Contact: Hartt,Nicholas Address: [address] [place] [address] [place] Phone: [phone] Mobile Phone: [phone] Relation: Son Secondary Emergency Contact: Snavely,Celia Address: [address]          [place], [place] [address] [place] Phone: [phone] Mobile Phone: [phone] Relation: Friend   Goals of care: Advanced Directive information Advanced Directives 11/08/2017  Does Patient Have a Medical Advance Directive? Yes  Type of Paramedic of [place];Out of facility DNR (pink MOST or yellow form);Living will  Does patient want to make changes to medical advance directive? -  Copy of [place] in Chart? Yes  Pre-existing out of facility DNR order (yellow form or pink MOST form) Yellow form placed in chart (order not valid for inpatient use);Pink MOST form placed in chart (order not valid for inpatient use)     Chief Complaint  Patient presents with  . Acute Visit    ear lavage    HPI:  Pt is a 77 y.o. female seen today at [facility] for an acute visit for evaluation of right ear cerumen impaction.she denies any other acute issues during visit.she has been using debrox ear drops to right ear now here for ear lavage.she denies any fever,chills,cough or pain in the ears.she states unable to hear with her hearing aids.     Past Medical History:  Diagnosis Date  . Anemia 12/2015  . Cataract   . Hearing loss 05/03/2016  . High cholesterol   . Osteopenia   .  Pericarditis 01/1999  . Pneumothorax 01/1999  . Thyroid disorder   . Vitamin B 12 deficiency    Past Surgical History:  Procedure Laterality Date  . birthmark      removed,left leg  . CATARACT EXTRACTION  2017  . Stacy?    Allergies  Allergen Reactions  . Atorvastatin     Muscle weakness  . Rosuvastatin     Muscle weaknes    Outpatient Encounter Medications as of 11/17/2017  Medication Sig  . alendronate (FOSAMAX) 70 MG tablet Take 1 tablet (70 mg total) by mouth every 7 (seven) days. Take with a full glass of water on an empty stomach.  . calcium carbonate (OS-CAL) 1250 (500 Ca) MG chewable tablet Chew 1 tablet (1,250 mg total) by mouth daily.  . carbamide peroxide (DEBROX) 6.5 % OTIC solution Place 5 drops into the right ear 2 (two) times daily.  . cholecalciferol (VITAMIN D) 1000 UNITS tablet Take 2,000 Units by mouth daily.   . clobetasol ointment (TEMOVATE) 2.12 % Apply 1 application topically every other day.   Marland Kitchen FERROUS SULFATE PO Take 1 tablet by mouth daily.  . fluticasone (FLONASE) 50 MCG/ACT nasal spray Place 1 spray into both nostrils daily as needed for allergies or rhinitis.  Marland Kitchen l-methylfolate-B6-B12 (METANX) 3-35-2 MG TABS tablet Take 1 tablet by mouth daily.  Marland Kitchen levothyroxine (SYNTHROID, LEVOTHROID) 125 MCG tablet Take 1 tablet (125 mcg total) by mouth daily.  Marland Kitchen loratadine (CLARITIN) 10 MG tablet Take 1 tablet (10 mg total) by mouth daily.  Marland Kitchen  Red Yeast Rice 600 MG TABS Take 1 tablet by mouth daily.   . Omega-3 Fatty Acids (FISH OIL) 1000 MG CAPS Take 1 capsule by mouth daily.    No facility-administered encounter medications on file as of 11/17/2017.     Review of Systems  Constitutional: Negative for chills and fever.  HENT: Positive for hearing loss. Negative for congestion, rhinorrhea, sinus pressure, sinus pain, sneezing and sore throat.   Eyes: Positive for visual disturbance. Negative for discharge and redness.       Wears  eye glasses   Respiratory: Negative for cough, chest tightness, shortness of breath and wheezing.   Gastrointestinal: Negative for abdominal distention, abdominal pain, constipation, diarrhea, nausea and vomiting.  Skin: Negative for color change, pallor and rash.  Neurological: Negative for dizziness, light-headedness and headaches.    Immunization History  Administered Date(s) Administered  . Influenza Whole 12/29/2006  . Influenza, High Dose Seasonal PF 11/21/2016  . Influenza,inj,Quad PF,6+ Mos 12/04/2013, 11/12/2014, 11/18/2015  . Influenza-Unspecified 12/02/2011, 11/15/2012, 12/02/2016  . PPD Test 01/05/2015  . Pneumococcal Conjugate-13 08/19/2014  . Pneumococcal Polysaccharide-23 11/18/2015  . Td 06/02/2008  . Zoster 03/08/2011  . Zoster Recombinat (Shingrix) 01/06/2016, 05/02/2016   Pertinent  Health Maintenance Due  Topic Date Due  . INFLUENZA VACCINE  10/05/2017  . COLONOSCOPY  07/11/2020  . DEXA SCAN  Completed  . PNA vac Low Risk Adult  Completed   Fall Risk  03/29/2017 11/23/2015 11/18/2015 11/02/2015 01/05/2015  Falls in the past year? No No No No Yes  Number falls in past yr: - - - - 2 or more  Injury with Fall? - - - - No  Comment - - - - -    Vitals:   11/17/17 0951  BP: 120/80  Pulse: 60  Resp: 18  Temp: 97.6 F (36.4 C)  SpO2: 99%  Weight: 152 lb (68.9 kg)  Height: 5\' 4"  (1.626 m)   Body mass index is 26.09 kg/m. Physical Exam  Constitutional: She is oriented to person, place, and time. She appears well-developed and well-nourished. No distress.  HENT:  Head: Normocephalic.  Left Ear: External ear normal.  Mouth/Throat: Oropharynx is clear and moist. No oropharyngeal exudate.  Right ear cerumen impaction lavaged with warm water and moderate amounts of cerumen with a alligator forceps.small pieces of tissue also removed.Patient tolerated procedure well.TM clear no signs of infections.   Eyes: Pupils are equal, round, and reactive to light.  Conjunctivae are normal. Right eye exhibits no discharge. Left eye exhibits no discharge. No scleral icterus.  Eye glasses in place   Neck: Normal range of motion. No JVD present. No thyromegaly present.  Cardiovascular: Normal rate, regular rhythm, normal heart sounds and intact distal pulses. Exam reveals no gallop and no friction rub.  No murmur heard. Pulmonary/Chest: Effort normal and breath sounds normal. No respiratory distress. She has no wheezes. She has no rales.  Abdominal: Soft. Bowel sounds are normal. She exhibits no distension and no mass. There is no tenderness. There is no rebound and no guarding.  Lymphadenopathy:    She has no cervical adenopathy.  Neurological: She is oriented to person, place, and time.  Skin: Skin is warm and dry. No rash noted. No erythema. No pallor.  Psychiatric: She has a normal mood and affect. Her behavior is normal.    Labs reviewed: Recent Labs    04/27/17 04/27/17 0815  NA 140 140  K 4.3 4.3  CL  --  105  CO2  --  29  GLUCOSE  --  95  BUN 13 13  CREATININE 0.61 0.61  CALCIUM 9.0 9.0   Recent Labs    04/27/17 04/27/17 0815  AST 17 17  ALT 16 16  ALKPHOS 64  --   BILITOT 0.6 0.6  PROT 6.3 6.3  ALBUMIN 3.9  --    Recent Labs    01/16/17 0000 04/27/17 04/27/17 0815  WBC 7.5 7.6 7.6  NEUTROABS  --   --  4,773  HGB 13.1 13.1 13.1  HCT 38.2 37.7 37.7  MCV 93.4  --  94.0  PLT 275  --  270   Lab Results  Component Value Date   TSH 1.92 05/31/2017   No results found for: HGBA1C Lab Results  Component Value Date   CHOL 248 (H) 01/16/2017   HDL 57 01/16/2017   LDLCALC 165 (H) 01/16/2017   LDLDIRECT 135.4 12/26/2006   TRIG 127 01/16/2017   CHOLHDL 4.4 01/16/2017    Significant Diagnostic Results in last 30 days:  No results found.  Assessment/Plan   Impacted cerumen of right ear Afebrile.right ear lavage with warm water and moderate amounts of cerumen with a alligator forceps.small pieces of tissue also removed.  tolerated procedure well.TM clear without any signs of infections.may discontinue use of debrox ear drops.Notify provider for any ear pain or signs of infections.follow up as needed.   Family/ staff Communication: Reviewed plan of care with patient.  Labs/tests ordered: None   Lanise Mergen C Chandra Feger, NP

## 2017-11-20 ENCOUNTER — Encounter: Payer: Self-pay | Admitting: Internal Medicine

## 2017-11-20 ENCOUNTER — Non-Acute Institutional Stay: Payer: Medicare Other | Admitting: Internal Medicine

## 2017-11-20 VITALS — BP 122/76 | HR 65 | Temp 98.7°F | Ht 64.0 in | Wt 155.4 lb

## 2017-11-20 DIAGNOSIS — Z23 Encounter for immunization: Secondary | ICD-10-CM | POA: Diagnosis not present

## 2017-11-20 DIAGNOSIS — H9193 Unspecified hearing loss, bilateral: Secondary | ICD-10-CM | POA: Diagnosis not present

## 2017-11-20 DIAGNOSIS — E559 Vitamin D deficiency, unspecified: Secondary | ICD-10-CM

## 2017-11-20 DIAGNOSIS — E039 Hypothyroidism, unspecified: Secondary | ICD-10-CM | POA: Diagnosis not present

## 2017-11-20 DIAGNOSIS — D509 Iron deficiency anemia, unspecified: Secondary | ICD-10-CM

## 2017-11-20 DIAGNOSIS — M810 Age-related osteoporosis without current pathological fracture: Secondary | ICD-10-CM | POA: Diagnosis not present

## 2017-11-20 DIAGNOSIS — E782 Mixed hyperlipidemia: Secondary | ICD-10-CM | POA: Diagnosis not present

## 2017-11-20 NOTE — Patient Instructions (Addendum)
Please get your flu shot.   DASH Eating Plan DASH stands for "Dietary Approaches to Stop Hypertension." The DASH eating plan is a healthy eating plan that has been shown to reduce high blood pressure (hypertension). It may also reduce your risk for type 2 diabetes, heart disease, and stroke. The DASH eating plan may also help with weight loss. What are tips for following this plan? General guidelines  Avoid eating more than 2,300 mg (milligrams) of salt (sodium) a day. If you have hypertension, you may need to reduce your sodium intake to 1,500 mg a day.  Limit alcohol intake to no more than 1 drink a day for nonpregnant women and 2 drinks a day for men. One drink equals 12 oz of beer, 5 oz of wine, or 1 oz of hard liquor.  Work with your health care provider to maintain a healthy body weight or to lose weight. Ask what an ideal weight is for you.  Get at least 30 minutes of exercise that causes your heart to beat faster (aerobic exercise) most days of the week. Activities may include walking, swimming, or biking.  Work with your health care provider or diet and nutrition specialist (dietitian) to adjust your eating plan to your individual calorie needs. Reading food labels  Check food labels for the amount of sodium per serving. Choose foods with less than 5 percent of the Daily Value of sodium. Generally, foods with less than 300 mg of sodium per serving fit into this eating plan.  To find whole grains, look for the word "whole" as the first word in the ingredient list. Shopping  Buy products labeled as "low-sodium" or "no salt added."  Buy fresh foods. Avoid canned foods and premade or frozen meals. Cooking  Avoid adding salt when cooking. Use salt-free seasonings or herbs instead of table salt or sea salt. Check with your health care provider or pharmacist before using salt substitutes.  Do not fry foods. Cook foods using healthy methods such as baking, boiling, grilling, and  broiling instead.  Cook with heart-healthy oils, such as olive, canola, soybean, or sunflower oil. Meal planning   Eat a balanced diet that includes: ? 5 or more servings of fruits and vegetables each day. At each meal, try to fill half of your plate with fruits and vegetables. ? Up to 6-8 servings of whole grains each day. ? Less than 6 oz of lean meat, poultry, or fish each day. A 3-oz serving of meat is about the same size as a deck of cards. One egg equals 1 oz. ? 2 servings of low-fat dairy each day. ? A serving of nuts, seeds, or beans 5 times each week. ? Heart-healthy fats. Healthy fats called Omega-3 fatty acids are found in foods such as flaxseeds and coldwater fish, like sardines, salmon, and mackerel.  Limit how much you eat of the following: ? Canned or prepackaged foods. ? Food that is high in trans fat, such as fried foods. ? Food that is high in saturated fat, such as fatty meat. ? Sweets, desserts, sugary drinks, and other foods with added sugar. ? Full-fat dairy products.  Do not salt foods before eating.  Try to eat at least 2 vegetarian meals each week.  Eat more home-cooked food and less restaurant, buffet, and fast food.  When eating at a restaurant, ask that your food be prepared with less salt or no salt, if possible. What foods are recommended? The items listed may not be a complete  list. Talk with your dietitian about what dietary choices are best for you. Grains Whole-grain or whole-wheat bread. Whole-grain or whole-wheat pasta. Brown rice. [place]. Bulgur. Whole-grain and low-sodium cereals. Pita bread. Low-fat, low-sodium crackers. Whole-wheat flour tortillas. Vegetables Fresh or frozen vegetables (raw, steamed, roasted, or grilled). Low-sodium or reduced-sodium tomato and vegetable juice. Low-sodium or reduced-sodium tomato sauce and tomato paste. Low-sodium or reduced-sodium canned vegetables. Fruits All fresh, dried, or frozen fruit. Canned  fruit in natural juice (without added sugar). Meat and other protein foods Skinless chicken or [place]. Ground chicken or [place]. Pork with fat trimmed off. Fish and seafood. Egg whites. Dried beans, peas, or lentils. Unsalted nuts, nut butters, and seeds. Unsalted canned beans. Lean cuts of beef with fat trimmed off. Low-sodium, lean deli meat. Dairy Low-fat (1%) or fat-free (skim) milk. Fat-free, low-fat, or reduced-fat cheeses. Nonfat, low-sodium ricotta or cottage cheese. Low-fat or nonfat yogurt. Low-fat, low-sodium cheese. Fats and oils Soft margarine without trans fats. Vegetable oil. Low-fat, reduced-fat, or light mayonnaise and salad dressings (reduced-sodium). Canola, safflower, olive, soybean, and sunflower oils. Avocado. Seasoning and other foods Herbs. Spices. Seasoning mixes without salt. Unsalted popcorn and pretzels. Fat-free sweets. What foods are not recommended? The items listed may not be a complete list. Talk with your dietitian about what dietary choices are best for you. Grains Baked goods made with fat, such as croissants, muffins, or some breads. Dry pasta or rice meal packs. Vegetables Creamed or fried vegetables. Vegetables in a cheese sauce. Regular canned vegetables (not low-sodium or reduced-sodium). Regular canned tomato sauce and paste (not low-sodium or reduced-sodium). Regular tomato and vegetable juice (not low-sodium or reduced-sodium). Angie Fava. Olives. Fruits Canned fruit in a light or heavy syrup. Fried fruit. Fruit in cream or butter sauce. Meat and other protein foods Fatty cuts of meat. Ribs. Fried meat. Berniece Salines. Sausage. Bologna and other processed lunch meats. Salami. Fatback. Hotdogs. Bratwurst. Salted nuts and seeds. Canned beans with added salt. Canned or smoked fish. Whole eggs or egg yolks. Chicken or [place] with skin. Dairy Whole or 2% milk, cream, and half-and-half. Whole or full-fat cream cheese. Whole-fat or sweetened yogurt. Full-fat cheese.  Nondairy creamers. Whipped toppings. Processed cheese and cheese spreads. Fats and oils Butter. Stick margarine. Lard. Shortening. Ghee. Bacon fat. Tropical oils, such as coconut, palm kernel, or palm oil. Seasoning and other foods Salted popcorn and pretzels. Onion salt, garlic salt, seasoned salt, table salt, and sea salt. Worcestershire sauce. Tartar sauce. Barbecue sauce. Teriyaki sauce. Soy sauce, including reduced-sodium. Steak sauce. Canned and packaged gravies. Fish sauce. Oyster sauce. Cocktail sauce. Horseradish that you find on the shelf. Ketchup. Mustard. Meat flavorings and tenderizers. Bouillon cubes. Hot sauce and Tabasco sauce. Premade or packaged marinades. Premade or packaged taco seasonings. Relishes. Regular salad dressings. Where to find more information:  National Heart, Lung, and [place]: https://wilson-eaton.com/  American Heart Association: www.heart.org Summary  The DASH eating plan is a healthy eating plan that has been shown to reduce high blood pressure (hypertension). It may also reduce your risk for type 2 diabetes, heart disease, and stroke.  With the DASH eating plan, you should limit salt (sodium) intake to 2,300 mg a day. If you have hypertension, you may need to reduce your sodium intake to 1,500 mg a day.  When on the DASH eating plan, aim to eat more fresh fruits and vegetables, whole grains, lean proteins, low-fat dairy, and heart-healthy fats.  Work with your health care provider or diet and nutrition specialist (dietitian) to  adjust your eating plan to your individual calorie needs. This information is not intended to replace advice given to you by your health care provider. Make sure you discuss any questions you have with your health care provider. Document Released: 02/10/2011 Document Revised: 02/15/2016 Document Reviewed: 02/15/2016 Elsevier Interactive Patient Education  [employer].

## 2017-11-20 NOTE — Progress Notes (Signed)
[facility]  Provider: Blanchie Serve MD   Location:  [place] of Service:  Clinic (12)  PCP: Blanchie Serve, MD Patient Care Team: Blanchie Serve, MD as PCP - General (Internal Medicine) Ronald Lobo, MD as Consulting Physician (Gastroenterology) Katy Apo, MD as Consulting Physician (Ophthalmology) Martinique, Amy, MD as Consulting Physician (Dermatology)  Extended Emergency Contact Information Primary Emergency Contact: Jeremy Johann Address: [address] [place] [address] [place] Phone: [phone] Mobile Phone: [phone] Relation: Son Secondary Emergency Contact: Snavely,Celia Address: [address]          [place], [place] [address] Johnnette Litter of [place] Phone: [phone] Mobile Phone: [phone] Relation: Friend   Goals of Care: Advanced Directive information Advanced Directives 11/20/2017  Does Patient Have a Medical Advance Directive? Yes  Type of Paramedic of [place];Living will  Does patient want to make changes to medical advance directive? -  Copy of [place] in Chart? Yes  Pre-existing out of facility DNR order (yellow form or pink MOST form) -      Chief Complaint  Patient presents with  . Medical Management of Chronic Issues    Pt is being seen for a CPE with EKG  . MMSE  . Audit C Screening    Score of 2  . Depression    score of 0    HPI: Patient is a 77 y.o. female seen today for her physical exam. She denies any acute concern. claritin has been helping with her symptom.   Iron deficiency anemia- takes ferrous sulfate daily. Also on U31 and folic acid supplement. Denies cnstipation  Osteoporosis- takes weekly fosamax and daily calcium and vitamin d supplement. No fall reported.   Hypothyroidism- takes levothyroxine 125 mcg daily, tolerating well, low energy overall   Allergic rhinitis- takes  loratadine with flonase. Her post nasal drip has improved.   Hyperlipidemia- takes red yeast tablet and tolerating well.   Past Medical History:  Diagnosis Date  . Anemia 12/2015  . Cataract   . Hearing loss 05/03/2016  . High cholesterol   . Osteopenia   . Pericarditis 01/1999  . Pneumothorax 01/1999  . Thyroid disorder   . Vitamin B 12 deficiency    Past Surgical History:  Procedure Laterality Date  . birthmark      removed,left leg  . CATARACT EXTRACTION  2017  . [place]?    reports that she has never smoked. She has never used smokeless tobacco. She reports that she drinks about 3.0 standard drinks of alcohol per week. She reports that she does not use drugs. Social History   Socioeconomic History  . Marital status: Divorced    Spouse name: Not on file  . Number of children: 1  . Years of education: college  . Highest education level: Not on file  Occupational History  . Occupation: retired  Scientific laboratory technician  . Financial resource strain: Not hard at all  . Food insecurity:    Worry: Never true    Inability: Never true  . Transportation needs:    Medical: No    Non-medical: No  Tobacco Use  . Smoking status: Never Smoker  . Smokeless tobacco: Never Used  Substance and Sexual Activity  . Alcohol use: Yes    Alcohol/week: 3.0 standard drinks    Types: 3 Glasses of wine per week  .  Drug use: No  . Sexual activity: Not on file  Lifestyle  . Physical activity:    Days per week: 4 days    Minutes per session: 30 min  . Stress: Only a little  Relationships  . Social connections:    Talks on phone: Three times a week    Gets together: More than three times a week    Attends religious service: More than 4 times per year    Active member of club or organization: Yes    Attends meetings of clubs or organizations: Never    Relationship status: Widowed  . Intimate partner violence:    Fear of current or ex partner: No    Emotionally  abused: No    Physically abused: No    Forced sexual activity: No  Other Topics Concern  . Not on file  Social History Narrative   Moved to [facility] 01/14/2015   Do you drink/eat things with caffeine?  yes   Marital status?      divorced                               Do you live in a house, apartment, assisted living, condo, trailer, etc.?  Retirement community   Is it one or more stories? Yes, 3   How many persons live in your home?  1   Do you have any pets in your home? (please list) 0   Current or past profession:  Librarian   Do you exercise?     yes                                 Type & how often? Walk-varies   Do you have a living will? yes   Do you have a DNR form?         no                         If not, do you want to discuss one? yes   Do you have signed POA/HPOA for forms? yes    Functional Status Survey:    Family History  Problem Relation Age of Onset  . Diabetes Mother   . Asthma Mother   . Ataxia Father   . Cancer - Cervical Sister   . Thyroid disease Sister   . Diabetes Other   . Diabetes Maternal Grandmother   . Arthritis Paternal Grandmother   . Colon cancer Neg Hx     Health Maintenance  Topic Date Due  . INFLUENZA VACCINE  10/05/2017  . TETANUS/TDAP  06/03/2018  . COLONOSCOPY  07/11/2020  . DEXA SCAN  Completed  . PNA vac Low Risk Adult  Completed    Allergies  Allergen Reactions  . Atorvastatin     Muscle weakness  . Rosuvastatin     Muscle weaknes    Outpatient Encounter Medications as of 11/20/2017  Medication Sig  . alendronate (FOSAMAX) 70 MG tablet Take 1 tablet (70 mg total) by mouth every 7 (seven) days. Take with a full glass of water on an empty stomach.  . calcium carbonate (OS-CAL) 1250 (500 Ca) MG chewable tablet Chew 1 tablet (1,250 mg total) by mouth daily.  . cholecalciferol (VITAMIN D) 1000 UNITS tablet Take 2,000 Units by mouth daily.   . clobetasol ointment (TEMOVATE) 0.05 %  Apply 1 application topically every  other day.   Marland Kitchen FERROUS SULFATE PO Take 1 tablet by mouth daily.  . fluticasone (FLONASE) 50 MCG/ACT nasal spray Place 1 spray into both nostrils daily as needed for allergies or rhinitis.  Marland Kitchen l-methylfolate-B6-B12 (METANX) 3-35-2 MG TABS tablet Take 1 tablet by mouth daily.  Marland Kitchen levothyroxine (SYNTHROID, LEVOTHROID) 125 MCG tablet Take 1 tablet (125 mcg total) by mouth daily.  Marland Kitchen loratadine (CLARITIN) 10 MG tablet Take 1 tablet (10 mg total) by mouth daily.  . Omega-3 Fatty Acids (FISH OIL) 1000 MG CAPS Take 1 capsule by mouth daily.   . Red Yeast Rice 600 MG TABS Take 1 tablet by mouth daily.    No facility-administered encounter medications on file as of 11/20/2017.     Review of Systems  Constitutional: Negative for appetite change, chills and fever.  HENT: Positive for postnasal drip. Negative for congestion, mouth sores, sore throat and trouble swallowing.   Eyes: Positive for visual disturbance.  Respiratory: Negative for cough, shortness of breath and wheezing.   Cardiovascular: Negative for chest pain, palpitations and leg swelling.  Gastrointestinal: Negative for abdominal pain, blood in stool, constipation, diarrhea, nausea and vomiting.  Genitourinary: Negative for dysuria, flank pain, frequency and hematuria.       Gets up once at night on some nights to urinate. Has occasional urgency with some urine leakage but overall stable  Musculoskeletal: Negative for arthralgias, back pain and gait problem.       No fall reported  Skin: Negative for rash.  Neurological: Negative for dizziness and headaches.  Psychiatric/Behavioral: Negative for behavioral problems, confusion, decreased concentration and dysphoric mood. The patient is not nervous/anxious.      Nursing Home from 11/20/2017 in [facility]  PHQ-2 Total Score  0     MMSE - Mini Mental State Exam 11/20/2017 03/29/2017  Orientation to time 4 5  Orientation to Place 5 5  Registration 3 3  Attention/ Calculation 5 5    Recall 3 3  Language- name 2 objects 2 2  Language- repeat 1 1  Language- follow 3 step command 3 3  Language- read & follow direction 1 1  Write a sentence 1 1  Copy design 1 1  Total score 29 30    Vitals:   11/20/17 1401  BP: 122/76  Pulse: 65  Temp: 98.7 F (37.1 C)  TempSrc: Oral  SpO2: 95%  Weight: 155 lb 6.4 oz (70.5 kg)  Height: _0  (1.626 m)   Body mass index is 26.67 kg/m.   Wt Readings from Last 3 Encounters:  11/20/17 155 lb 6.4 oz (70.5 kg)  11/17/17 152 lb (68.9 kg)  11/08/17 154 lb 6.4 oz (70 kg)   Physical Exam  Constitutional: She is oriented to person, place, and time. She appears well-developed and well-nourished. No distress.  HENT:  Head: Normocephalic and atraumatic.  Left Ear: External ear normal.  Nose: Nose normal.  Mouth/Throat: Oropharynx is clear and moist. No oropharyngeal exudate.  Eyes: Pupils are equal, round, and reactive to light. Conjunctivae and EOM are normal. Right eye exhibits no discharge. Left eye exhibits no discharge. No scleral icterus.  Wears corrective glasses  Neck: Normal range of motion. Neck supple.  Cardiovascular: Normal rate, regular rhythm and intact distal pulses.  Pulmonary/Chest: Effort normal and breath sounds normal. No respiratory distress. She has no wheezes. She has no rales.  Abdominal: Soft. Bowel sounds are normal. There is no tenderness. There is no  guarding.  Musculoskeletal: Normal range of motion. She exhibits no edema.  Lymphadenopathy:    She has no cervical adenopathy.  Neurological: She is alert and oriented to person, place, and time. She displays normal reflexes. No cranial nerve deficit or sensory deficit. She exhibits normal muscle tone. Coordination normal.  11/20/17 MMSE 29/30, passed clock draw  Skin: Skin is warm and dry. Capillary refill takes less than 2 seconds. She is not diaphoretic.  Psychiatric: She has a normal mood and affect. Her behavior is normal.    Labs reviewed: Basic  Metabolic Panel: Recent Labs    04/27/17 04/27/17 0815  NA 140 140  K 4.3 4.3  CL  --  105  CO2  --  29  GLUCOSE  --  95  BUN 13 13  CREATININE 0.61 0.61  CALCIUM 9.0 9.0   Liver Function Tests: Recent Labs    04/27/17 04/27/17 0815  AST 17 17  ALT 16 16  ALKPHOS 64  --   BILITOT 0.6 0.6  PROT 6.3 6.3  ALBUMIN 3.9  --    No results for input(s): LIPASE, AMYLASE in the last 8760 hours. No results for input(s): AMMONIA in the last 8760 hours. CBC: Recent Labs    01/16/17 0000 04/27/17 04/27/17 0815  WBC 7.5 7.6 7.6  NEUTROABS  --   --  4,773  HGB 13.1 13.1 13.1  HCT 38.2 37.7 37.7  MCV 93.4  --  94.0  PLT 275  --  270   Cardiac Enzymes: No results for input(s): CKTOTAL, CKMB, CKMBINDEX, TROPONINI in the last 8760 hours. BNP: Invalid input(s): POCBNP No results found for: HGBA1C Lab Results  Component Value Date   TSH 1.92 05/31/2017   Lab Results  Component Value Date   VITAMINB12 >2000 (H) 11/23/2015   No results found for: FOLATE Lab Results  Component Value Date   IRON 78 01/16/2017   TIBC 326 01/16/2017   FERRITIN 39 04/27/2017    Lipid Panel: Recent Labs    01/16/17 0000  CHOL 248*  HDL 57  LDLCALC 165*  TRIG 127  CHOLHDL 4.4   No results found for: HGBA1C  Procedures since last visit: No results found.   11/20/17 EKG- normal sinus rhythm, 63 bpm  Assessment/Plan  1. HYPERLIPIDEMIA Continue red yeast and fish oil. Check labs. Reviewed ekg. DASH diet reading material provided. counselled on exercise  - EKG 12-Lead - CMP with eGFR([facility]); Future - Lipid Panel; Future  2. Iron deficiency anemia, unspecified iron deficiency anemia type Continue iron supplement, update cbc and iron panel - Iron and TIBC; Future - CBC (no diff); Future  3. Hypothyroidism, unspecified type Check thyroid function, continue levothyroxine - TSH; Future - Vitamin D, 25-hydroxy; Future  4. Bilateral hearing loss, unspecified hearing loss  type Continue eharing aids, ears are clean  5. Senile osteoporosis Continue fosamax with ca and vit d, weight bearing exercise encouraged - Vitamin D, 25-hydroxy; Future  6. Vitamin D deficiency Continue vitamin d supplement.  - Vitamin D, 25-hydroxy; Future  7. Annual exam counselled on diet and exercise. advised to get influenza vaccination. uptodate with dexa scan and other immunization. Depression screening and MMSE reviewed. Routine labs ordered. uptodate with colonoscopy.    Labs/tests ordered:   Lab Orders     CMP with eGFR([facility])     Lipid Panel     Iron and TIBC     CBC (no diff)     TSH     Vitamin D, 25-hydroxy  Next appointment: 6 month  Communication: reviewed care plan with patient     Blanchie Serve, MD Internal Medicine [place], [place] [address] Cell Phone (Monday-Friday 8 am - 5 pm): [phone] On Call: [phone] and follow prompts after 5 pm and on weekends Office Phone: [phone] Office Fax: [phone]

## 2017-11-20 NOTE — Addendum Note (Signed)
Addended byBlanchie Serve on: 11/20/2017 04:25 PM   Modules accepted: Level of Service

## 2017-11-22 ENCOUNTER — Other Ambulatory Visit: Payer: Self-pay | Admitting: *Deleted

## 2017-11-22 DIAGNOSIS — E782 Mixed hyperlipidemia: Secondary | ICD-10-CM | POA: Diagnosis not present

## 2017-11-22 DIAGNOSIS — D509 Iron deficiency anemia, unspecified: Secondary | ICD-10-CM

## 2017-11-22 DIAGNOSIS — M810 Age-related osteoporosis without current pathological fracture: Secondary | ICD-10-CM | POA: Diagnosis not present

## 2017-11-22 DIAGNOSIS — E559 Vitamin D deficiency, unspecified: Secondary | ICD-10-CM | POA: Diagnosis not present

## 2017-11-22 DIAGNOSIS — E039 Hypothyroidism, unspecified: Secondary | ICD-10-CM | POA: Diagnosis not present

## 2017-11-22 NOTE — Progress Notes (Signed)
iron

## 2017-11-23 ENCOUNTER — Other Ambulatory Visit: Payer: Medicare Other

## 2017-11-23 DIAGNOSIS — D509 Iron deficiency anemia, unspecified: Secondary | ICD-10-CM

## 2017-11-23 DIAGNOSIS — E559 Vitamin D deficiency, unspecified: Secondary | ICD-10-CM

## 2017-11-23 DIAGNOSIS — M810 Age-related osteoporosis without current pathological fracture: Secondary | ICD-10-CM

## 2017-11-23 DIAGNOSIS — E782 Mixed hyperlipidemia: Secondary | ICD-10-CM

## 2017-11-23 DIAGNOSIS — E039 Hypothyroidism, unspecified: Secondary | ICD-10-CM

## 2017-11-24 LAB — COMPLETE METABOLIC PANEL WITH GFR
AG RATIO: 1.7 (calc) (ref 1.0–2.5)
ALBUMIN MSPROF: 4.1 g/dL (ref 3.6–5.1)
ALKALINE PHOSPHATASE (APISO): 49 U/L (ref 33–130)
ALT: 15 U/L (ref 6–29)
AST: 16 U/L (ref 10–35)
BUN: 16 mg/dL (ref 7–25)
CALCIUM: 8.9 mg/dL (ref 8.6–10.4)
CO2: 23 mmol/L (ref 20–32)
CREATININE: 0.68 mg/dL (ref 0.60–0.93)
Chloride: 103 mmol/L (ref 98–110)
GFR, EST NON AFRICAN AMERICAN: 85 mL/min/{1.73_m2} (ref >=60)
GFR, Est African American: 98 mL/min/{1.73_m2} (ref >=60)
GLOBULIN: 2.4 g/dL (ref 1.9–3.7)
GLUCOSE: 93 mg/dL (ref 65–99)
Potassium: 4.5 mmol/L (ref 3.5–5.3)
SODIUM: 138 mmol/L (ref 135–146)
Total Bilirubin: 0.5 mg/dL (ref 0.2–1.2)
Total Protein: 6.5 g/dL (ref 6.1–8.1)

## 2017-11-24 LAB — VITAMIN D 25 HYDROXY (VIT D DEFICIENCY, FRACTURES): Vit D, 25-Hydroxy: 46 ng/mL (ref 30–100)

## 2017-11-24 LAB — CBC
HCT: 40.3 % (ref 35.0–45.0)
Hemoglobin: 13.8 g/dL (ref 11.7–15.5)
MCH: 32.2 pg (ref 27.0–33.0)
MCHC: 34.2 g/dL (ref 32.0–36.0)
MCV: 93.9 fL (ref 80.0–100.0)
MPV: 11.2 fL (ref 7.5–12.5)
PLATELETS: 266 10*3/uL (ref 140–400)
RBC: 4.29 10*6/uL (ref 3.80–5.10)
RDW: 12.3 % (ref 11.0–15.0)
WBC: 8.1 10*3/uL (ref 3.8–10.8)

## 2017-11-24 LAB — LIPID PANEL
Cholesterol: 235 mg/dL — ABNORMAL HIGH (ref <200)
HDL: 53 mg/dL (ref >50)
LDL Cholesterol (Calc): 157 mg/dL (calc) — ABNORMAL HIGH
Non-HDL Cholesterol (Calc): 182 mg/dL (calc) — ABNORMAL HIGH (ref <130)
Total CHOL/HDL Ratio: 4.4 (calc) (ref <5.0)
Triglycerides: 123 mg/dL (ref <150)

## 2017-11-24 LAB — TSH: TSH: 3.97 mIU/L (ref 0.40–4.50)

## 2017-11-24 LAB — IRON,TIBC AND FERRITIN PANEL
%SAT: 31 % (ref 16–45)
FERRITIN: 90 ng/mL (ref 16–288)
Iron: 81 ug/dL (ref 45–160)
TIBC: 261 mcg/dL (calc) (ref 250–450)

## 2018-02-16 ENCOUNTER — Telehealth: Payer: Self-pay

## 2018-02-16 NOTE — Telephone Encounter (Signed)
Patient called complaining of cold/upper respiratory/sinus symptoms  1. Fever? No 2. Chills? No 3. Myalgias? No 4. How long have you had your symptoms? Before Thanksgiving  5. Are you coughing up mucus and if yes any discoloration? Non productive cough  6. What have you done for your symptoms thus far? [facility] Daytime and Nighttime cold medicine  7. Have you been around anyone sick? Yes, lives at [facility]   Patient also c/o sore throat and no energy. Patient states congestion is moving into chest.   Scheduled appointment for first available, Thursday at [facility] with Sherrie Mustache, NP (patient given address and landmark). Patient would like any advice or recommendations prior to pending appointment, please advise    I will forward your response to your provider and call you with further instructions

## 2018-02-16 NOTE — Telephone Encounter (Signed)
Ngetich, Nelda Bucks, NP  Logan Bores, CMA  Caller: Unspecified (Today, 1:58 PM)        1. Encourage fluid intake.  2. May take OTC tylenol for pain/fever and Robitussin syrup for cough.  3. Need to go to ED or urgent care if symptoms worsen.    Discussed Dinah's response with patient, patient verbalized understanding.

## 2018-02-17 ENCOUNTER — Ambulatory Visit (HOSPITAL_COMMUNITY)
Admission: EM | Admit: 2018-02-17 | Discharge: 2018-02-17 | Disposition: A | Discharge status: Home / Self Care | Payer: Medicare Other | Attending: Family Medicine | Admitting: Family Medicine

## 2018-02-17 ENCOUNTER — Ambulatory Visit (INDEPENDENT_AMBULATORY_CARE_PROVIDER_SITE_OTHER): Payer: Medicare Other

## 2018-02-17 ENCOUNTER — Encounter (HOSPITAL_COMMUNITY): Payer: Self-pay | Admitting: Emergency Medicine

## 2018-02-17 DIAGNOSIS — R05 Cough: Secondary | ICD-10-CM | POA: Diagnosis not present

## 2018-02-17 DIAGNOSIS — J069 Acute upper respiratory infection, unspecified: Secondary | ICD-10-CM

## 2018-02-17 DIAGNOSIS — R0602 Shortness of breath: Secondary | ICD-10-CM | POA: Diagnosis not present

## 2018-02-17 MED ORDER — GUAIFENESIN ER 600 MG PO TB12: 600.0000 mg | ORAL_TABLET | Freq: Two times a day (BID) | ORAL | 0 refills | Status: DC

## 2018-02-17 MED ORDER — BENZONATATE 100 MG PO CAPS: 100.0000 mg | ORAL_CAPSULE | Freq: Three times a day (TID) | ORAL | 0 refills | Status: DC

## 2018-02-17 NOTE — Discharge Instructions (Signed)
Your x-ray did not reveal any pneumonia I am prescribing Tessalon Perles to use as needed for cough Go ahead and start taking some Mucinex to help with cough and congestion Follow up as needed for continued or worsening symptoms

## 2018-02-17 NOTE — ED Provider Notes (Signed)
[facility]    CSN: [id number] Arrival date & time: 02/17/18  1011     History   Chief Complaint Chief Complaint  Patient presents with  . URI    HPI Jessica [name] is a 77 y.o. female.   Patient is a 77 year old female with past medical history of anemia, cholesterol, pericarditis, pneumothorax.  She is here for approximately 2 weeks of cough, congestion, low-grade fever, body aches, chills, fatigue.  Started with a sore throat approximate 2 weeks ago and since has progressed.  Symptoms have been waxing waning.  She been taking some over-the-counter daytime and nighttime cough and cold medication.  This does relieve her symptoms somewhat.  Describes the cough as dry and hacking.  She does report some postnasal drip.  Denies any ear pain.  Denies any nausea, vomiting, diarrhea.  She is been eating and drinking okay. She lives in a retirement commutnity.   ROS per HPI    URI    Past Medical History:  Diagnosis Date  . Anemia 12/2015  . Cataract   . Hearing loss 05/03/2016  . High cholesterol   . Osteopenia   . Pericarditis 01/1999  . Pneumothorax 01/1999  . Thyroid disorder   . Vitamin B 12 deficiency     Patient Active Problem List   Diagnosis Date Noted  . Senile osteoporosis 11/20/2017  . Iron deficiency 04/26/2017  . Lichen sclerosus 09/38/1829  . External hemorrhoid 04/26/2017  . Weakness of left leg 06/07/2016  . Balance disorder 05/24/2016  . Memory deficit 05/24/2016  . Anemia 05/19/2016  . Hearing loss 05/03/2016  . Hypothyroidism 07/24/2013  . Bradycardia on ECG 07/24/2013  . Numbness 08/07/2012  . HYPERLIPIDEMIA 12/29/2006    Past Surgical History:  Procedure Laterality Date  . birthmark      removed,left leg  . CATARACT EXTRACTION  2017  . [place]?    OB History   No obstetric history on file.      Home Medications    Prior to Admission medications   Medication Sig Start Date End Date  Taking? Authorizing Provider  alendronate (FOSAMAX) 70 MG tablet Take 1 tablet (70 mg total) by mouth every 7 (seven) days. Take with a full glass of water on an empty stomach. 09/11/17   Blanchie Serve, MD  benzonatate (TESSALON) 100 MG capsule Take 1 capsule (100 mg total) by mouth every 8 (eight) hours. 02/17/18   Loura Halt A, NP  calcium carbonate (OS-CAL) 1250 (500 Ca) MG chewable tablet Chew 1 tablet (1,250 mg total) by mouth daily. 09/11/17   Blanchie Serve, MD  cholecalciferol (VITAMIN D) 1000 UNITS tablet Take 2,000 Units by mouth daily.     [provider]  clobetasol ointment (TEMOVATE) 9.37 % Apply 1 application topically every other day.     [provider]  FERROUS SULFATE PO Take 1 tablet by mouth daily.    [provider]  fluticasone (FLONASE) 50 MCG/ACT nasal spray Place 1 spray into both nostrils daily as needed for allergies or rhinitis. 11/08/17   Blanchie Serve, MD  guaiFENesin (MUCINEX) 600 MG 12 hr tablet Take 1 tablet (600 mg total) by mouth 2 (two) times daily. 02/17/18   Nariah Morgano, Tressia Miners A, NP  l-methylfolate-B6-B12 (METANX) 3-35-2 MG TABS tablet Take 1 tablet by mouth daily.    [provider]  levothyroxine (SYNTHROID, LEVOTHROID) 125 MCG tablet Take 1 tablet (125 mcg total) by mouth daily. 08/01/17   Blanchie Serve, MD  loratadine (CLARITIN) 10 MG tablet Take 1 tablet (10 mg total) by mouth daily. 11/08/17   Blanchie Serve, MD  Red Yeast Rice 600 MG TABS Take 1 tablet by mouth daily.     [provider]    Family History Family History  Problem Relation Age of Onset  . Diabetes Mother   . Asthma Mother   . Ataxia Father   . Cancer - Cervical Sister   . Thyroid disease Sister   . Diabetes Other   . Diabetes Maternal Grandmother   . Arthritis Paternal Grandmother   . Colon cancer Neg Hx     Social History Social History   Tobacco Use  . Smoking status: Never Smoker  . Smokeless tobacco: Never Used  Substance Use Topics    . Alcohol use: Yes    Alcohol/week: 3.0 standard drinks    Types: 3 Glasses of wine per week  . Drug use: No     Allergies   Atorvastatin and Rosuvastatin   Review of Systems Review of Systems   Physical Exam Triage Vital Signs ED Triage Vitals  Enc Vitals Group     BP 02/17/18 1035 (!) 144/80     Pulse Rate 02/17/18 1035 71     Resp 02/17/18 1035 18     Temp 02/17/18 1035 97.7 F (36.5 C)     Temp Source 02/17/18 1035 Oral     SpO2 02/17/18 1035 95 %     Weight --      Height --      Head Circumference --      Peak Flow --      Pain Score 02/17/18 1036 3     Pain Loc --      Pain Edu? --      Excl. in [place]? --    No data found.  Updated Vital Signs BP (!) 144/80 (BP Location: Right Arm)   Pulse 71   Temp 97.7 F (36.5 C) (Oral)   Resp 18   SpO2 95%   Visual Acuity Right Eye Distance:   Left Eye Distance:   Bilateral Distance:    Right Eye Near:   Left Eye Near:    Bilateral Near:     Physical Exam Vitals signs and nursing note reviewed.  Constitutional:      Appearance: Normal appearance. She is not ill-appearing or toxic-appearing.  HENT:     Head: Normocephalic and atraumatic.     Right Ear: Tympanic membrane, ear canal and external ear normal.     Left Ear: Tympanic membrane, ear canal and external ear normal.     Nose: Congestion and rhinorrhea present.     Mouth/Throat:     Mouth: Mucous membranes are moist.     Pharynx: Posterior oropharyngeal erythema present.  Eyes:     Conjunctiva/sclera: Conjunctivae normal.     Pupils: Pupils are equal, round, and reactive to light.  Neck:     Musculoskeletal: Normal range of motion.  Cardiovascular:     Rate and Rhythm: Normal rate and regular rhythm.     Pulses: Normal pulses.     Heart sounds: Normal heart sounds.  Pulmonary:     Effort: Pulmonary effort is normal.     Breath sounds: Normal breath sounds.  Musculoskeletal: Normal range of motion.  Lymphadenopathy:     Cervical: No cervical  adenopathy.  Skin:    General: Skin is warm and dry.  Neurological:     Mental Status: She is alert.  Psychiatric:        Mood and Affect: Mood normal.      UC Treatments / Results  Labs (all labs ordered are listed, but only abnormal results are displayed) Labs Reviewed - No data to display  EKG None  Radiology Dg Chest 2 View  Result Date: 02/17/2018 CLINICAL DATA:  Cough and congestion.  Shortness of breath. EXAM: CHEST - 2 VIEW COMPARISON:  June 04, 2009 FINDINGS: Mild atelectasis in the left base. The heart, hila, mediastinum, lungs, and pleura are otherwise unremarkable. IMPRESSION: No active cardiopulmonary disease. Electronically Signed   By: Dorise Bullion III M.D   On: 02/17/2018 11:05    Procedures Procedures (including critical care time)  Medications Ordered in UC Medications - No data to display  Initial Impression / Assessment and Plan / UC Course  I have reviewed the triage vital signs and the nursing notes.  Pertinent labs & imaging results that were available during my care of the patient were reviewed by me and considered in my medical decision making (see chart for details).     Patient is a 77 year old female with 2 weeks of cough, congestion, fatigue.  We will go ahead and do chest x-ray to rule out any pneumonia, bronchitis or any other abnormalities.  X-ray did not reveal any abnormalities This Is most likely a viral upper respiratory infection We will do Tessalon Perles for cough We will have her start Mucinex daily Follow up as needed for continued or worsening symptoms  Final Clinical Impressions(s) / UC Diagnoses   Final diagnoses:  Viral upper respiratory tract infection     Discharge Instructions     Your x-ray did not reveal any pneumonia I am prescribing Tessalon Perles to use as needed for cough Go ahead and start taking some Mucinex to help with cough and congestion Follow up as needed for continued or worsening  symptoms     ED Prescriptions    Medication Sig Dispense Auth. Provider   benzonatate (TESSALON) 100 MG capsule Take 1 capsule (100 mg total) by mouth every 8 (eight) hours. 21 capsule Rui Wordell A, NP   guaiFENesin (MUCINEX) 600 MG 12 hr tablet Take 1 tablet (600 mg total) by mouth 2 (two) times daily. 15 tablet Loura Halt A, NP     Controlled Substance Prescriptions [place] Controlled Substance Registry consulted? Not Applicable   Orvan July, NP 02/17/18 1117

## 2018-02-17 NOTE — ED Triage Notes (Signed)
Pt here for URI x more than 2 weeks with cough and congestion

## 2018-02-22 ENCOUNTER — Ambulatory Visit: Payer: Medicare Other | Admitting: Nurse Practitioner

## 2018-03-06 ENCOUNTER — Non-Acute Institutional Stay: Payer: Medicare Other | Admitting: Family Medicine

## 2018-03-06 ENCOUNTER — Telehealth: Payer: Self-pay

## 2018-03-06 ENCOUNTER — Encounter: Payer: Self-pay | Admitting: Family Medicine

## 2018-03-06 VITALS — BP 118/80 | HR 61 | Temp 97.6°F | Ht 64.0 in | Wt 152.8 lb

## 2018-03-06 DIAGNOSIS — E782 Mixed hyperlipidemia: Secondary | ICD-10-CM | POA: Diagnosis not present

## 2018-03-06 DIAGNOSIS — E039 Hypothyroidism, unspecified: Secondary | ICD-10-CM | POA: Diagnosis not present

## 2018-03-06 DIAGNOSIS — R05 Cough: Secondary | ICD-10-CM

## 2018-03-06 DIAGNOSIS — R059 Cough, unspecified: Secondary | ICD-10-CM

## 2018-03-06 MED ORDER — PREDNISONE 20 MG PO TABS: 20.0000 mg | ORAL_TABLET | Freq: Every day | ORAL | 0 refills | Status: DC

## 2018-03-06 MED ORDER — FLUTICASONE FUROATE-VILANTEROL 100-25 MCG/INH IN AEPB: 1.0000 | INHALATION_SPRAY | RESPIRATORY_TRACT | 0 refills | Status: DC

## 2018-03-06 NOTE — Telephone Encounter (Signed)
Shueneaka, I told patient the inhaler may be too expensive and we discussed using just oral prednisone in a taper dose over about 1 week I sent prescription over to [facility] to that effect.  Thanks

## 2018-03-06 NOTE — Progress Notes (Addendum)
Provider:  Alain Honey, MD Location:  [place] of Service:  Clinic (12)  PCP: Sandrea Hughs, NP Patient Care Team: Ngetich, Nelda Bucks, NP as PCP - General (Family Medicine) Ronald Lobo, MD as Consulting Physician (Gastroenterology) Katy Apo, MD as Consulting Physician (Ophthalmology) Martinique, Amy, MD as Consulting Physician (Dermatology)  Extended Emergency Contact Information Primary Emergency Contact: Geissinger,Nicholas Address: [address] [place] [address] [place] Phone: [phone] Mobile Phone: [phone] Relation: Son Secondary Emergency Contact: Snavely,Celia Address: [address]          [place], [place] [address] [place] of [place] Phone: [phone] Mobile Phone: [phone] Relation: Friend  Code Status:  Goals of Care: Advanced Directive information Advanced Directives 03/06/2018  Does Patient Have a Medical Advance Directive? Yes  Type of Advance Directive [place]  Does patient want to make changes to medical advance directive? No - Patient declined  Copy of [place] in Chart? Yes - validated most recent copy scanned in chart (See row information)  Pre-existing out of facility DNR order (yellow form or pink MOST form) -      Chief Complaint  Patient presents with  . Acute Visit    left ear discomfort  . cough for 5 weeks    HPI: Patient is a 77 y.o. female seen today for follow-up lipids and cough.  Patient has had a cough since Thanksgiving.  She was seen at [facility] urgent care on 1214.  Chest x-ray showed no acute process but she is continued to have a dry cough.  I suspect this might be what is known as post respiratory syndrome.  She did take some Tessalon with some short-term relief.  We also discussed her labs from September.  She has mildly elevated cholesterol with the LDL portion at 157.  She did not tolerate statins but  takes red yeast rice.  She was also started on fish oil  for reasons that are unclear since that primarily affect triglycerides. Past Medical History:  Diagnosis Date  . Anemia 12/2015  . Cataract   . Hearing loss 05/03/2016  . High cholesterol   . Osteopenia   . Pericarditis 01/1999  . Pneumothorax 01/1999  . Thyroid disorder   . Vitamin B 12 deficiency    Past Surgical History:  Procedure Laterality Date  . birthmark      removed,left leg  . CATARACT EXTRACTION  2017  . [place]?    reports that she has never smoked. She has never used smokeless tobacco. She reports current alcohol use of about 3.0 standard drinks of alcohol per week. She reports that she does not use drugs. Social History   Socioeconomic History  . Marital status: Divorced    Spouse name: Not on file  . Number of children: 1  . Years of education: college  . Highest education level: Not on file  Occupational History  . Occupation: retired  Scientific laboratory technician  . Financial resource strain: Not hard at all  . Food insecurity:    Worry: Never true    Inability: Never true  . Transportation needs:    Medical: No    Non-medical: No  Tobacco Use  . Smoking status: Never Smoker  . Smokeless tobacco: Never Used  Substance and Sexual Activity  . Alcohol use: Yes    Alcohol/week: 3.0 standard drinks  Types: 3 Glasses of wine per week  . Drug use: No  . Sexual activity: Not on file  Lifestyle  . Physical activity:    Days per week: 4 days    Minutes per session: 30 min  . Stress: Only a little  Relationships  . Social connections:    Talks on phone: Three times a week    Gets together: More than three times a week    Attends religious service: More than 4 times per year    Active member of club or organization: Yes    Attends meetings of clubs or organizations: Never    Relationship status: Widowed  . Intimate partner violence:    Fear of current or ex partner: No     Emotionally abused: No    Physically abused: No    Forced sexual activity: No  Other Topics Concern  . Not on file  Social History Narrative   M[facility] to [facility] 01/14/2015   Do you drink/eat things with caffeine?  yes   Marital status?      divorced                               Do you live in a house, apartment, assisted living, condo, trailer, etc.?  Retirement community   Is it one or more stories? Yes, 3   How many persons live in your home?  1   Do you have any pets in your home? (please list) 0   Current or past profession:  Librarian   Do you exercise?     yes                                 Type & how often? Walk-varies   Do you have a living will? yes   Do you have a DNR form?         no                         If not, do you want to discuss one? yes   Do you have signed POA/HPOA for forms? yes    Functional Status Survey:    Family History  Problem Relation Age of Onset  . Diabetes Mother   . Asthma Mother   . Ataxia Father   . Cancer - Cervical Sister   . Thyroid disease Sister   . Diabetes Other   . Diabetes Maternal Grandmother   . Arthritis Paternal Grandmother   . Colon cancer Neg Hx     Health Maintenance  Topic Date Due  . INFLUENZA VACCINE  10/05/2017  . TETANUS/TDAP  06/03/2018  . COLONOSCOPY  07/11/2020  . DEXA SCAN  Completed  . PNA vac Low Risk Adult  Completed    Allergies  Allergen Reactions  . Atorvastatin     Muscle weakness  . Rosuvastatin     Muscle weaknes    Outpatient Encounter Medications as of 03/06/2018  Medication Sig  . alendronate (FOSAMAX) 70 MG tablet Take 1 tablet (70 mg total) by mouth every 7 (seven) days. Take with a full glass of water on an empty stomach.  . calcium carbonate (OS-CAL) 1250 (500 Ca) MG chewable tablet Chew 1 tablet (1,250 mg total) by mouth daily.  . cholecalciferol (VITAMIN D) 1000 UNITS tablet Take 2,000 Units by mouth  daily.   . clobetasol ointment (TEMOVATE) 0.27 % Apply 1 application  topically every other day.   Marland Kitchen FERROUS SULFATE PO Take 1 tablet by mouth daily.  Marland Kitchen guaiFENesin (MUCINEX) 600 MG 12 hr tablet Take 1 tablet (600 mg total) by mouth 2 (two) times daily.  Marland Kitchen l-methylfolate-B6-B12 (METANX) 3-35-2 MG TABS tablet Take 1 tablet by mouth daily.  Marland Kitchen levothyroxine (SYNTHROID, LEVOTHROID) 125 MCG tablet Take 1 tablet (125 mcg total) by mouth daily.  . Red Yeast Rice 600 MG TABS Take 1 tablet by mouth daily.   . [DISCONTINUED] fluticasone (FLONASE) 50 MCG/ACT nasal spray Place 1 spray into both nostrils daily as needed for allergies or rhinitis.  Marland Kitchen loratadine (CLARITIN) 10 MG tablet Take 1 tablet (10 mg total) by mouth daily. (Patient not taking: Reported on 03/06/2018)  . [DISCONTINUED] benzonatate (TESSALON) 100 MG capsule Take 1 capsule (100 mg total) by mouth every 8 (eight) hours.   No facility-administered encounter medications on file as of 03/06/2018.     Review of Systems  Respiratory: Positive for cough.   All other systems reviewed and are negative.   Vitals:   03/06/18 0829  BP: 118/80  Pulse: 61  Temp: 97.6 F (36.4 C)  TempSrc: Oral  SpO2: 96%  Weight: 152 lb 12.8 oz (69.3 kg)  Height: 5\' 4"  (1.626 m)   Body mass index is 26.23 kg/m. Physical Exam Constitutional:      Appearance: Normal appearance.  HENT:     Head:     Comments: Left EAC has some wax but I am able to visualize the tympanic membrane    Nose: Nose normal.  Cardiovascular:     Rate and Rhythm: Normal rate and regular rhythm.  Pulmonary:     Effort: Pulmonary effort is normal.     Breath sounds: Normal breath sounds. No wheezing, rhonchi or rales.  Neurological:     General: No focal deficit present.     Mental Status: She is alert and oriented to person, place, and time.  Psychiatric:        Mood and Affect: Mood normal.        Thought Content: Thought content normal.     Labs reviewed: Basic Metabolic Panel: Recent Labs    04/27/17 04/27/17 0815 11/22/17 0000    NA 140 140 138  K 4.3 4.3 4.5  CL  --  105 103  CO2  --  29 23  GLUCOSE  --  95 93  BUN 13 13 16   CREATININE 0.61 0.61 0.68  CALCIUM 9.0 9.0 8.9   Liver Function Tests: Recent Labs    04/27/17 04/27/17 0815 11/22/17 0000  AST 17 17 16   ALT 16 16 15   ALKPHOS 64  --   --   BILITOT 0.6 0.6 0.5  PROT 6.3 6.3 6.5  ALBUMIN 3.9  --   --    No results for input(s): LIPASE, AMYLASE in the last 8760 hours. No results for input(s): AMMONIA in the last 8760 hours. CBC: Recent Labs    04/27/17 04/27/17 0815 11/22/17 0000  WBC 7.6 7.6 8.1  NEUTROABS  --  4,773  --   HGB 13.1 13.1 13.8  HCT 37.7 37.7 40.3  MCV  --  94.0 93.9  PLT  --  270 266   Cardiac Enzymes: No results for input(s): CKTOTAL, CKMB, CKMBINDEX, TROPONINI in the last 8760 hours. BNP: Invalid input(s): POCBNP No results found for: HGBA1C Lab Results  Component Value Date  TSH 3.97 11/22/2017   Lab Results  Component Value Date   VITAMINB12 >2000 (H) 11/23/2015   No results found for: FOLATE Lab Results  Component Value Date   IRON 81 11/22/2017   TIBC 261 11/22/2017   FERRITIN 90 11/22/2017    Imaging and Procedures obtained prior to SNF admission: Dg Chest 2 View  Result Date: 02/17/2018 CLINICAL DATA:  Cough and congestion.  Shortness of breath. EXAM: CHEST - 2 VIEW COMPARISON:  June 04, 2009 FINDINGS: Mild atelectasis in the left base. The heart, hila, mediastinum, lungs, and pleura are otherwise unremarkable. IMPRESSION: No active cardiopulmonary disease. Electronically Signed   By: Dorise Bullion III M.D   On: 02/17/2018 11:05    Assessment/Plan 1. Hypothyroidism, unspecified type TSH is in normal range.  No changes are indicated  2. HYPERLIPIDEMIA I do not think patient needs or would benefit greatly from lipid-lowering drugs.  Have cautioned her about weight gain as the best way for her to control what are minimally elevated LDL levels   3. Cough I suspect this is related to  inflammation left over from earlier viral infection.  Will try her on inhaler with steroid.  Family/ staff Communication: Findings communicated to patient  Labs/tests ordered:  Lillette Boxer. Sabra Heck, [place] [address] [place], [place] [phone]

## 2018-03-06 NOTE — Telephone Encounter (Signed)
Patient seen Dr.Miller this am. RX for Jessica [name] was sent to pharmacy and it was too expensive, 356 dollars. Patient is requesting an alternative.  Please advise

## 2018-03-06 NOTE — Telephone Encounter (Signed)
Left message informing patient of Dr.Miller's response. Patient instructed to call if she has questions or concerns

## 2018-03-12 ENCOUNTER — Telehealth: Payer: Self-pay | Admitting: *Deleted

## 2018-03-12 MED ORDER — PREDNISONE 20 MG PO TABS: ORAL_TABLET | ORAL | 0 refills | Status: DC

## 2018-03-12 MED ORDER — PREDNISONE 20 MG PO TABS: 20.0000 mg | ORAL_TABLET | Freq: Every day | ORAL | 0 refills | Status: DC

## 2018-03-12 NOTE — Telephone Encounter (Signed)
Patient called and stated that she has completed her Prednisone but still has some wheezing and coughing though it is much better. Patient is wanting to know if she should do another rough of Prednisone or just wait it out. Please Advise.

## 2018-03-12 NOTE — Telephone Encounter (Signed)
Per Dr. Sabra Heck: Verbal phone- given another round of Prednisone 60-60-40-40-20-20  Patient notified and agreed.  Faxed Rx to pharmacy per Dr. Sabra Heck.

## 2018-03-22 DIAGNOSIS — L900 Lichen sclerosus et atrophicus: Secondary | ICD-10-CM | POA: Diagnosis not present

## 2018-03-22 DIAGNOSIS — Z1231 Encounter for screening mammogram for malignant neoplasm of breast: Secondary | ICD-10-CM | POA: Diagnosis not present

## 2018-04-18 ENCOUNTER — Encounter: Payer: Medicare Other | Admitting: Internal Medicine

## 2018-04-23 ENCOUNTER — Encounter: Payer: Self-pay | Admitting: Internal Medicine

## 2018-05-09 ENCOUNTER — Other Ambulatory Visit: Payer: Self-pay

## 2018-05-09 ENCOUNTER — Non-Acute Institutional Stay: Payer: Medicare Other | Admitting: Internal Medicine

## 2018-05-09 ENCOUNTER — Encounter: Payer: Self-pay | Admitting: Internal Medicine

## 2018-05-09 VITALS — BP 130/88 | HR 61 | Temp 97.7°F | Ht 64.0 in | Wt 154.4 lb

## 2018-05-09 DIAGNOSIS — R0600 Dyspnea, unspecified: Secondary | ICD-10-CM | POA: Insufficient documentation

## 2018-05-09 DIAGNOSIS — M810 Age-related osteoporosis without current pathological fracture: Secondary | ICD-10-CM | POA: Diagnosis not present

## 2018-05-09 DIAGNOSIS — H9193 Unspecified hearing loss, bilateral: Secondary | ICD-10-CM | POA: Diagnosis not present

## 2018-05-09 DIAGNOSIS — R0602 Shortness of breath: Secondary | ICD-10-CM

## 2018-05-09 DIAGNOSIS — E782 Mixed hyperlipidemia: Secondary | ICD-10-CM | POA: Diagnosis not present

## 2018-05-09 DIAGNOSIS — D509 Iron deficiency anemia, unspecified: Secondary | ICD-10-CM | POA: Diagnosis not present

## 2018-05-09 DIAGNOSIS — E611 Iron deficiency: Secondary | ICD-10-CM

## 2018-05-09 DIAGNOSIS — E538 Deficiency of other specified B group vitamins: Secondary | ICD-10-CM | POA: Insufficient documentation

## 2018-05-09 DIAGNOSIS — E039 Hypothyroidism, unspecified: Secondary | ICD-10-CM

## 2018-05-09 NOTE — Progress Notes (Signed)
Location: [place] of Service:  Clinic (12)  Provider:   Code Status:  Goals of Care:  Advanced Directives 05/09/2018  Does Patient Have a Medical Advance Directive? Yes  Type of Advance Directive [place]  Does patient want to make changes to medical advance directive? No - Patient declined  Copy of [place] in Chart? Yes - validated most recent copy scanned in chart (See row information)  Pre-existing out of facility DNR order (yellow form or pink MOST form) -     Chief Complaint  Patient presents with  . Medical Management of Chronic Issues    6 month follow up.High fall risk.left ear clogged.SOB    HPI: Patient is a 78 y.o. female seen today for an acute visit for Ear Clogged and also SOB Patient has h/o Osteoporosis in Fosamax, Hyperlipidemia, Hypothyroidism, And Vit B 12 def She came to the clinic today with main c/o SOB since her respiratory illness in and around thanksgiving. She says it is not that bad as before so has gotten better but she feels SOB especially when she is walking long distance and especially with Incline surfaces. Her cough is now resolved Denies any  c Chest pain , Fever or wheezing She was prescribed Prednisone which did help her before in Dec.  [place]. Has not had any falls recently. Is independent in her IADL and ADL Sometimes use Cane for walking. Doing exercises in the facility.    Past Medical History:  Diagnosis Date  . Anemia 12/2015  . Cataract   . Hearing loss 05/03/2016  . High cholesterol   . Osteopenia   . Pericarditis 01/1999  . Pneumothorax 01/1999  . Thyroid disorder   . Vitamin B 12 deficiency     Past Surgical History:  Procedure Laterality Date  . birthmark      removed,left leg  . CATARACT EXTRACTION  2017  . [place]?    Allergies  Allergen Reactions  . Atorvastatin     Muscle weakness  .  Rosuvastatin     Muscle weaknes    Outpatient Encounter Medications as of 05/09/2018  Medication Sig  . alendronate (FOSAMAX) 70 MG tablet Take 1 tablet (70 mg total) by mouth every 7 (seven) days. Take with a full glass of water on an empty stomach.  . calcium carbonate (OS-CAL) 1250 (500 Ca) MG chewable tablet Chew 1 tablet (1,250 mg total) by mouth daily.  . cholecalciferol (VITAMIN D) 1000 UNITS tablet Take 2,000 Units by mouth daily.   . clobetasol ointment (TEMOVATE) 7.61 % Apply 1 application topically every other day.   Marland Kitchen FERROUS SULFATE PO Take 1 tablet by mouth daily.  Marland Kitchen l-methylfolate-B6-B12 (METANX) 3-35-2 MG TABS tablet Take 1 tablet by mouth daily.  Marland Kitchen levothyroxine (SYNTHROID, LEVOTHROID) 125 MCG tablet Take 1 tablet (125 mcg total) by mouth daily.  . Red Yeast Rice 600 MG TABS Take 1 tablet by mouth daily.   . [DISCONTINUED] guaiFENesin (MUCINEX) 600 MG 12 hr tablet Take 1 tablet (600 mg total) by mouth 2 (two) times daily.  . [DISCONTINUED] loratadine (CLARITIN) 10 MG tablet Take 1 tablet (10 mg total) by mouth daily. (Patient not taking: Reported on 03/06/2018)  . [DISCONTINUED] predniSONE (DELTASONE) 20 MG tablet Take 3 tablets x 2 days, 2 tablets x 2 days, 1 tablet x 2 days and then stop (Patient not taking: Reported on 05/09/2018)  No facility-administered encounter medications on file as of 05/09/2018.     Review of Systems:  Review of Systems  Review of Systems  Constitutional: Negative for activity change, appetite change, chills, diaphoresis, fatigue and fever.  HENT: Negative for mouth sores, postnasal drip, rhinorrhea, sinus pain and sore throat.   Respiratory: Negative for apnea, cough, chest tightness,  and wheezing.   Cardiovascular: Negative for chest pain, palpitations and leg swelling.  Gastrointestinal: Negative for abdominal distention, abdominal pain, constipation, diarrhea, nausea and vomiting.  Genitourinary: Negative for dysuria and frequency.    Musculoskeletal: Negative for arthralgias, joint swelling and myalgias.  Skin: Negative for rash.  Neurological: Negative for dizziness, syncope, weakness, light-headedness and numbness.  Psychiatric/Behavioral: Negative for behavioral problems, confusion and sleep disturbance.     Health Maintenance  Topic Date Due  . INFLUENZA VACCINE  10/05/2017  . TETANUS/TDAP  06/03/2018  . COLONOSCOPY  07/11/2020  . DEXA SCAN  Completed  . PNA vac Low Risk Adult  Completed    Physical Exam: Vitals:   05/09/18 1349  BP: 130/88  Pulse: 61  Temp: 97.7 F (36.5 C)  SpO2: 95%  Weight: 154 lb 6.4 oz (70 kg)  Height: 5\' 4"  (1.626 m)   Body mass index is 26.5 kg/m. Physical Exam  Constitutional: Oriented to person, place, and time. Well-developed and well-nourished.  HENT: Has Wax in her both ears Head: Normocephalic.  Mouth/Throat: Oropharynx is clear and moist.  Eyes: Pupils are equal, round, and reactive to light.  Neck: Neck supple.  Cardiovascular: Normal rate and normal heart sounds.  No murmur heard. Pulmonary/Chest: Effort normal and breath sounds normal. No respiratory distress. No wheezes. She has no rales.  Abdominal: Soft. Bowel sounds are normal. No distension. There is no tenderness. There is no rebound.  Musculoskeletal: No edema.  Lymphadenopathy: none Neurological: Alert and oriented to person, place, and time.  Skin: Skin is warm and dry.  Psychiatric: Normal mood and affect. Behavior is normal. Thought content normal.    Labs reviewed: Basic Metabolic Panel: Recent Labs    05/31/17 0000 11/22/17 0000  NA  --  138  K  --  4.5  CL  --  103  CO2  --  23  GLUCOSE  --  93  BUN  --  16  CREATININE  --  0.68  CALCIUM  --  8.9  TSH 1.92 3.97   Liver Function Tests: Recent Labs    11/22/17 0000  AST 16  ALT 15  BILITOT 0.5  PROT 6.5   No results for input(s): LIPASE, AMYLASE in the last 8760 hours. No results for input(s): AMMONIA in the last 8760  hours. CBC: Recent Labs    11/22/17 0000  WBC 8.1  HGB 13.8  HCT 40.3  MCV 93.9  PLT 266   Lipid Panel: Recent Labs    11/22/17 0000  CHOL 235*  HDL 53  LDLCALC 157*  TRIG 123  CHOLHDL 4.4   No results found for: HGBA1C  Procedures since last visit: No results found.  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  HYPERLIPIDEMIA LDL high Is open to try Statin again Will repeat Lipid Panel  Iron deficiency On Iron supplement Colonoscopy in 05/19 was negative Anemia Resolved now Will repeat CBC  Bilateral hearing loss, due to ear wax Ear  Washed today  Hypothyroidism Repeat TSH H/o Vit B12 def On Supplement Last B12 was high Will repeat the Level    Labs/tests ordered:  @ORDERS @ Next appt:  09/05/2018

## 2018-05-16 ENCOUNTER — Encounter: Payer: Self-pay | Admitting: *Deleted

## 2018-05-16 ENCOUNTER — Encounter: Payer: Self-pay | Admitting: Internal Medicine

## 2018-05-16 NOTE — Progress Notes (Signed)
Received message regarding health maintenance questions, replied by MyChart and mail.

## 2018-05-23 ENCOUNTER — Encounter: Payer: Medicare Other | Admitting: Internal Medicine

## 2018-05-25 ENCOUNTER — Encounter: Payer: Self-pay | Admitting: Family

## 2018-05-30 ENCOUNTER — Encounter: Payer: Medicare Other | Admitting: Family

## 2018-05-30 ENCOUNTER — Telehealth: Payer: Self-pay

## 2018-05-30 MED ORDER — TETANUS-DIPHTH-ACELL PERTUSSIS 5-2-15.5 LF-MCG/0.5 IM SUSP: 0.5000 mL | Freq: Once | INTRAMUSCULAR | 0 refills | Status: AC

## 2018-05-30 NOTE — Telephone Encounter (Signed)
RX has been sent to pharmacy patient is aware

## 2018-05-30 NOTE — Telephone Encounter (Signed)
Send prescription to pharmacy to administer Tdap I.m.

## 2018-05-30 NOTE — Telephone Encounter (Signed)
Patient would like tdap vaccine prescription sent to her local pharmacy

## 2018-06-04 NOTE — Progress Notes (Signed)
Opened in error

## 2018-06-07 ENCOUNTER — Other Ambulatory Visit: Payer: Self-pay | Admitting: *Deleted

## 2018-06-07 MED ORDER — LEVOTHYROXINE SODIUM 125 MCG PO TABS: 125.0000 ug | ORAL_TABLET | Freq: Every day | ORAL | 3 refills | Status: DC

## 2018-07-12 ENCOUNTER — Other Ambulatory Visit: Payer: Self-pay | Admitting: *Deleted

## 2018-07-12 MED ORDER — L-METHYLFOLATE-B6-B12 3-35-2 MG PO TABS: 1.0000 | ORAL_TABLET | Freq: Every day | ORAL | 1 refills | Status: DC

## 2018-07-12 NOTE — Telephone Encounter (Signed)
Patient requested refill to be sent to [facility]. Faxed.

## 2018-07-13 ENCOUNTER — Other Ambulatory Visit: Payer: Self-pay | Admitting: Internal Medicine

## 2018-08-11 ENCOUNTER — Encounter: Payer: Self-pay | Admitting: Internal Medicine

## 2018-08-11 DIAGNOSIS — M816 Localized osteoporosis [Lequesne]: Secondary | ICD-10-CM

## 2018-08-12 ENCOUNTER — Encounter: Payer: Self-pay | Admitting: Internal Medicine

## 2018-08-13 MED ORDER — ALENDRONATE SODIUM 70 MG PO TABS: 70.0000 mg | ORAL_TABLET | ORAL | 11 refills | Status: DC

## 2018-08-13 NOTE — Telephone Encounter (Signed)
Routed to Virgie Dad, MD

## 2018-08-14 ENCOUNTER — Other Ambulatory Visit: Payer: Self-pay

## 2018-08-14 ENCOUNTER — Encounter: Payer: Self-pay | Admitting: Nurse Practitioner

## 2018-08-14 ENCOUNTER — Ambulatory Visit (INDEPENDENT_AMBULATORY_CARE_PROVIDER_SITE_OTHER): Payer: Medicare Other | Admitting: Nurse Practitioner

## 2018-08-14 VITALS — BP 112/60 | HR 58 | Temp 98.0°F | Ht 64.0 in | Wt 152.6 lb

## 2018-08-14 DIAGNOSIS — I872 Venous insufficiency (chronic) (peripheral): Secondary | ICD-10-CM | POA: Diagnosis not present

## 2018-08-14 NOTE — Patient Instructions (Signed)
Decrease sodium intake.  To wear compression hose/sock during the day to help with swelling Elevate legs throughout the day as tolerated.  To elevate above the level of the heart 45-1 hour a day.    Edema  Edema is when you have too much fluid in your body or under your skin. Edema may make your legs, feet, and ankles swell up. Swelling is also common in looser tissues, like around your eyes. This is a common condition. It gets more common as you get older. There are many possible causes of edema. Eating too much salt (sodium) and being on your feet or sitting for a long time can cause edema in your legs, feet, and ankles. Hot weather may make edema worse. Edema is usually painless. Your skin may look swollen or shiny. Follow these instructions at home:  Keep the swollen body part raised (elevated) above the level of your heart when you are sitting or lying down.  Do not sit still or stand for a long time.  Do not wear tight clothes. Do not wear garters on your upper legs.  Exercise your legs. This can help the swelling go down.  Wear elastic bandages or support stockings as told by your doctor.  Eat a low-salt (low-sodium) diet to reduce fluid as told by your doctor.  Depending on the cause of your swelling, you may need to limit how much fluid you drink (fluid restriction).  Take over-the-counter and prescription medicines only as told by your doctor. Contact a doctor if:  Treatment is not working.  You have heart, liver, or kidney disease and have symptoms of edema.  You have sudden and unexplained weight gain. Get help right away if:  You have shortness of breath or chest pain.  You cannot breathe when you lie down.  You have pain, redness, or warmth in the swollen areas.  You have heart, liver, or kidney disease and get edema all of a sudden.  You have a fever and your symptoms get worse all of a sudden. Summary  Edema is when you have too much fluid in your body  or under your skin.  Edema may make your legs, feet, and ankles swell up. Swelling is also common in looser tissues, like around your eyes.  Raise (elevate) the swollen body part above the level of your heart when you are sitting or lying down.  Follow your doctor's instructions about diet and how much fluid you can drink (fluid restriction). This information is not intended to replace advice given to you by your health care provider. Make sure you discuss any questions you have with your health care provider. Document Released: 08/10/2007 Document Revised: 03/11/2016 Document Reviewed: 03/11/2016 Elsevier Interactive Patient Education  2019 [employer].

## 2018-08-14 NOTE — Progress Notes (Signed)
Careteam: Patient Care Team: Virgie Dad, MD as PCP - General (Internal Medicine) Ronald Lobo, MD as Consulting Physician (Gastroenterology) Katy Apo, MD as Consulting Physician (Ophthalmology) Martinique, Amy, MD as Consulting Physician (Dermatology) Mast, Man X, NP as Nurse Practitioner (Internal Medicine)  Advanced Directive information    Allergies  Allergen Reactions  . Atorvastatin     Muscle weakness  . Rosuvastatin     Muscle weaknes    Chief Complaint  Patient presents with  . Acute Visit    Swelling in feet and ankles      HPI: Patient is a 78 y.o. female seen in the office today for swelling in feet and ankles. Better today but bothered her for 2-3 weeks.  A few nights ago could not see ankle which prompted the appt.  No shortness of breath, cough, congestion.  No chest pains.  Does not eat high sodium meals generally. Occasionally will eat a freezer meal. No can foods.  Sitting around more.  Used to walk several miles a day until a few weeks ago, now harder due to humidity.  No injury, fall or joint pains.  Review of Systems:  Review of Systems  Constitutional: Negative for chills, fever and weight loss.  Respiratory: Negative for cough, shortness of breath and wheezing.   Cardiovascular: Positive for leg swelling (improved today). Negative for chest pain and palpitations.  Musculoskeletal: Negative for falls, joint pain and myalgias.    Past Medical History:  Diagnosis Date  . Anemia 12/2015  . Cataract   . Hearing loss 05/03/2016  . High cholesterol   . Osteopenia   . Pericarditis 01/1999  . Pneumothorax 01/1999  . Thyroid disorder   . Vitamin B 12 deficiency    Past Surgical History:  Procedure Laterality Date  . birthmark      removed,left leg  . CATARACT EXTRACTION  2017  . [place]?   Social History:   reports that she has never smoked. She has never used smokeless tobacco. She reports current  alcohol use of about 3.0 standard drinks of alcohol per week. She reports that she does not use drugs.  Family History  Problem Relation Age of Onset  . Diabetes Mother   . Asthma Mother   . Ataxia Father   . Cancer - Cervical Sister   . Thyroid disease Sister   . Diabetes Other   . Diabetes Maternal Grandmother   . Arthritis Paternal Grandmother   . Colon cancer Neg Hx     Medications: Patient's Medications  New Prescriptions   No medications on file  Previous Medications   ALENDRONATE (FOSAMAX) 70 MG TABLET    Take 1 tablet (70 mg total) by mouth every 7 (seven) days. Take with a full glass of water on an empty stomach.   CALCIUM CARBONATE (OS-CAL) 1250 (500 CA) MG CHEWABLE TABLET    Chew 1 tablet (1,250 mg total) by mouth daily.   CHOLECALCIFEROL (VITAMIN D) 1000 UNITS TABLET    Take 2,000 Units by mouth daily.    CLOBETASOL OINTMENT (TEMOVATE) 0.05 %    Apply 1 application topically every other day.    FERROUS SULFATE PO    Take 1 tablet by mouth daily.   L-METHYLFOLATE-B6-B12 (METANX) 3-35-2 MG TABS TABLET    Take 1 tablet by mouth daily.   LEVOTHYROXINE (SYNTHROID, LEVOTHROID) 125 MCG TABLET    Take 1 tablet (125 mcg total) by mouth daily.   RED YEAST RICE 600  MG TABS    Take 1 tablet by mouth daily.   Modified Medications   No medications on file  Discontinued Medications   No medications on file    Physical Exam:  Vitals:   08/14/18 0949  BP: 112/60  Pulse: (!) 58  Temp: 98 F (36.7 C)  TempSrc: Oral  SpO2: 96%  Weight: 152 lb 9.6 oz (69.2 kg)  Height: 5\' 4"  (1.626 m)   Body mass index is 26.19 kg/m. Wt Readings from Last 3 Encounters:  08/14/18 152 lb 9.6 oz (69.2 kg)  05/09/18 154 lb 6.4 oz (70 kg)  03/06/18 152 lb 12.8 oz (69.3 kg)    Physical Exam Constitutional:      Appearance: Normal appearance.  Neck:     Musculoskeletal: Normal range of motion and neck supple.  Cardiovascular:     Rate and Rhythm: Normal rate and regular rhythm.   Pulmonary:     Effort: Pulmonary effort is normal.     Breath sounds: Normal breath sounds.  Abdominal:     General: There is no distension.     Palpations: Abdomen is soft.  Musculoskeletal: Normal range of motion.        General: No swelling, tenderness, deformity or signs of injury.     Right lower leg: No edema.     Left lower leg: No edema.     Comments: Trace edema noted to tops of feet bilateral.   Skin:    General: Skin is warm and dry.     Findings: No erythema.  Neurological:     Mental Status: She is alert.     Labs reviewed: Basic Metabolic Panel: Recent Labs    11/22/17 0000  NA 138  K 4.5  CL 103  CO2 23  GLUCOSE 93  BUN 16  CREATININE 0.68  CALCIUM 8.9  TSH 3.97   Liver Function Tests: Recent Labs    11/22/17 0000  AST 16  ALT 15  BILITOT 0.5  PROT 6.5   No results for input(s): LIPASE, AMYLASE in the last 8760 hours. No results for input(s): AMMONIA in the last 8760 hours. CBC: Recent Labs    11/22/17 0000  WBC 8.1  HGB 13.8  HCT 40.3  MCV 93.9  PLT 266   Lipid Panel: Recent Labs    11/22/17 0000  CHOL 235*  HDL 53  LDLCALC 157*  TRIG 123  CHOLHDL 4.4   TSH: Recent Labs    11/22/17 0000  TSH 3.97   A1C: No results found for: HGBA1C   Assessment/Plan 1. Edema of both lower extremities due to peripheral venous insufficiency Improved currently. Discussed use of sodium which can contribute to swelling.  -elevate legs as tolerated, best to elevate above level of heart. -to use compression hose/sock daily -to notify if worsen swelling, pain, redness, or fever occurs.   Next appt: 09/06/2018 Carlos American. [place], [facility] [phone]

## 2018-08-22 ENCOUNTER — Encounter: Payer: Medicare Other | Admitting: Internal Medicine

## 2018-09-05 ENCOUNTER — Other Ambulatory Visit: Payer: Self-pay | Admitting: Internal Medicine

## 2018-09-05 ENCOUNTER — Encounter: Payer: Self-pay | Admitting: Internal Medicine

## 2018-09-05 ENCOUNTER — Other Ambulatory Visit: Payer: Self-pay

## 2018-09-05 DIAGNOSIS — E538 Deficiency of other specified B group vitamins: Secondary | ICD-10-CM

## 2018-09-05 DIAGNOSIS — E782 Mixed hyperlipidemia: Secondary | ICD-10-CM

## 2018-09-05 DIAGNOSIS — E039 Hypothyroidism, unspecified: Secondary | ICD-10-CM | POA: Diagnosis not present

## 2018-09-05 DIAGNOSIS — D509 Iron deficiency anemia, unspecified: Secondary | ICD-10-CM

## 2018-09-06 ENCOUNTER — Other Ambulatory Visit: Payer: Medicare Other

## 2018-09-09 LAB — CBC WITH DIFFERENTIAL/PLATELET
Absolute Monocytes: 466 cells/uL (ref 200–950)
Basophils Absolute: 52 cells/uL (ref 0–200)
Basophils Relative: 0.7 %
Eosinophils Absolute: 192 cells/uL (ref 15–500)
Eosinophils Relative: 2.6 %
HCT: 38.1 % (ref 35.0–45.0)
Hemoglobin: 12.8 g/dL (ref 11.7–15.5)
Lymphs Abs: 2457 cells/uL (ref 850–3900)
MCH: 32.1 pg (ref 27.0–33.0)
MCHC: 33.6 g/dL (ref 32.0–36.0)
MCV: 95.5 fL (ref 80.0–100.0)
MPV: 11 fL (ref 7.5–12.5)
Monocytes Relative: 6.3 %
Neutro Abs: 4233 cells/uL (ref 1500–7800)
Neutrophils Relative %: 57.2 %
Platelets: 258 10*3/uL (ref 140–400)
RBC: 3.99 10*6/uL (ref 3.80–5.10)
RDW: 13.1 % (ref 11.0–15.0)
Total Lymphocyte: 33.2 %
WBC: 7.4 10*3/uL (ref 3.8–10.8)

## 2018-09-09 LAB — TSH: TSH: 5.11 mIU/L — ABNORMAL HIGH (ref 0.40–4.50)

## 2018-09-09 LAB — LIPID PANEL
Cholesterol: 213 mg/dL — ABNORMAL HIGH (ref <200)
HDL: 52 mg/dL (ref >=50)
LDL Cholesterol (Calc): 140 mg/dL (calc) — ABNORMAL HIGH
Non-HDL Cholesterol (Calc): 161 mg/dL (calc) — ABNORMAL HIGH (ref <130)
Total CHOL/HDL Ratio: 4.1 (calc) (ref <5.0)
Triglycerides: 101 mg/dL (ref <150)

## 2018-09-09 LAB — VITAMIN B12: Vitamin B-12: 570 pg/mL (ref 200–1100)

## 2018-09-09 LAB — COMPLETE METABOLIC PANEL WITH GFR
AG Ratio: 2 (calc) (ref 1.0–2.5)
ALT: 14 U/L (ref 6–29)
AST: 16 U/L (ref 10–35)
Albumin: 4 g/dL (ref 3.6–5.1)
Alkaline phosphatase (APISO): 44 U/L (ref 37–153)
BUN/Creatinine Ratio: 25 (calc) — ABNORMAL HIGH (ref 6–22)
BUN: 15 mg/dL (ref 7–25)
CO2: 27 mmol/L (ref 20–32)
Calcium: 8.8 mg/dL (ref 8.6–10.4)
Chloride: 106 mmol/L (ref 98–110)
Creat: 0.59 mg/dL — ABNORMAL LOW (ref 0.60–0.93)
GFR, Est African American: 102 mL/min/{1.73_m2} (ref >=60)
GFR, Est Non African American: 88 mL/min/{1.73_m2} (ref >=60)
Globulin: 2 g/dL (calc) (ref 1.9–3.7)
Glucose, Bld: 92 mg/dL (ref 65–99)
Potassium: 4.1 mmol/L (ref 3.5–5.3)
Sodium: 141 mmol/L (ref 135–146)
Total Bilirubin: 0.6 mg/dL (ref 0.2–1.2)
Total Protein: 6 g/dL — ABNORMAL LOW (ref 6.1–8.1)

## 2018-09-12 ENCOUNTER — Other Ambulatory Visit: Payer: Self-pay

## 2018-09-12 ENCOUNTER — Non-Acute Institutional Stay: Payer: Medicare Other | Admitting: Internal Medicine

## 2018-09-12 ENCOUNTER — Encounter: Payer: Self-pay | Admitting: Internal Medicine

## 2018-09-12 VITALS — BP 114/80 | HR 85 | Temp 97.8°F | Ht 64.0 in | Wt 151.8 lb

## 2018-09-12 DIAGNOSIS — E782 Mixed hyperlipidemia: Secondary | ICD-10-CM

## 2018-09-12 DIAGNOSIS — M810 Age-related osteoporosis without current pathological fracture: Secondary | ICD-10-CM

## 2018-09-12 DIAGNOSIS — E039 Hypothyroidism, unspecified: Secondary | ICD-10-CM

## 2018-09-12 DIAGNOSIS — R0602 Shortness of breath: Secondary | ICD-10-CM

## 2018-09-12 DIAGNOSIS — L84 Corns and callosities: Secondary | ICD-10-CM

## 2018-09-12 NOTE — Progress Notes (Signed)
Location:      Place of Service:     Provider:   Code Status:  Goals of Care:  Advanced Directives 05/09/2018  Does Patient Have a Medical Advance Directive? Yes  Type of Advance Directive [place]  Does patient want to make changes to medical advance directive? No - Patient declined  Copy of [place] in Chart? Yes - validated most recent copy scanned in chart (See row information)  Pre-existing out of facility DNR order (yellow form or pink MOST form) -     Chief Complaint  Patient presents with  . Medical Management of Chronic Issues    4 month follow up with labs patient states she has a blister developing on right foot     HPI: Patient is a 78 y.o. female seen today for medical management of chronic diseases.   Patient has h/o Osteoporosis in Fosamax, Hyperlipidemia, Hypothyroidism, And Vit B 12 def Patient is doing well. Walking around the Facility Still Driving C/O Callus in Right foot No other Complain. SOB has improved with Exercise and Tai chi Exercise Watches her diet  [place] Has not had any falls recently. Is independent in her IADL and ADL Sometimes use Cane for walking. Still Drives Mood Stable Is going to grocery store but takes precautions  Past Medical History:  Diagnosis Date  . Anemia 12/2015  . Cataract   . Hearing loss 05/03/2016  . High cholesterol   . Osteopenia   . Pericarditis 01/1999  . Pneumothorax 01/1999  . Thyroid disorder   . Vitamin B 12 deficiency     Past Surgical History:  Procedure Laterality Date  . birthmark      removed,left leg  . CATARACT EXTRACTION  2017  . [place]?    Allergies  Allergen Reactions  . Atorvastatin     Muscle weakness  . Rosuvastatin     Muscle weaknes    Outpatient Encounter Medications as of 09/12/2018  Medication Sig  . alendronate (FOSAMAX) 70 MG tablet Take 1 tablet (70 mg total) by mouth  every 7 (seven) days. Take with a full glass of water on an empty stomach.  . calcium carbonate (OS-CAL) 1250 (500 Ca) MG chewable tablet Chew 1 tablet (1,250 mg total) by mouth daily.  . cholecalciferol (VITAMIN D) 1000 UNITS tablet Take 2,000 Units by mouth daily.   . clobetasol ointment (TEMOVATE) 0.96 % Apply 1 application topically every other day.   Marland Kitchen FERROUS SULFATE PO Take 1 tablet by mouth daily.  Marland Kitchen l-methylfolate-B6-B12 (METANX) 3-35-2 MG TABS tablet Take 1 tablet by mouth daily.  Marland Kitchen levothyroxine (SYNTHROID, LEVOTHROID) 125 MCG tablet Take 1 tablet (125 mcg total) by mouth daily.  . Red Yeast Rice 600 MG TABS Take 1 tablet by mouth daily.   . vitamin C (ASCORBIC ACID) 500 MG tablet Take 500 mg by mouth daily.   No facility-administered encounter medications on file as of 09/12/2018.     Review of Systems:  Review of Systems  Review of Systems  Constitutional: Negative for activity change, appetite change, chills, diaphoresis, fatigue and fever.  HENT: Negative for mouth sores, postnasal drip, rhinorrhea, sinus pain and sore throat.   Respiratory: Negative for apnea, cough, chest tightness, shortness of breath and wheezing.   Cardiovascular: Negative for chest pain, palpitations and leg swelling.  Gastrointestinal: Negative for abdominal distention, abdominal pain, constipation, diarrhea, nausea and vomiting.  Genitourinary: Negative for  dysuria and frequency.  Musculoskeletal: Negative for arthralgias, joint swelling and myalgias.  Skin: Negative for rash.  Neurological: Negative for dizziness, syncope, weakness, light-headedness and numbness.  Psychiatric/Behavioral: Negative for behavioral problems, confusion and sleep disturbance.     Health Maintenance  Topic Date Due  . INFLUENZA VACCINE  10/06/2018  . COLONOSCOPY  07/11/2020  . TETANUS/TDAP  05/30/2028  . DEXA SCAN  Completed  . PNA vac Low Risk Adult  Completed    Physical Exam: Vitals:   09/12/18 1322  BP:  114/80  Pulse: 85  Temp: 97.8 F (36.6 C)  TempSrc: Oral  SpO2: 96%  Weight: 151 lb 12.8 oz (68.9 kg)  Height: 5' 4"  (1.626 m)   Body mass index is 26.06 kg/m. Physical Exam  Constitutional: Oriented to person, place, and time. Well-developed and well-nourished.  HENT:  Head: Normocephalic.  Mouth/Throat: Oropharynx is clear and moist.  Eyes: Pupils are equal, round, and reactive to light.  Neck: Neck supple.  Cardiovascular: Normal rate and normal heart sounds.  No murmur heard. Pulmonary/Chest: Effort normal and breath sounds normal. No respiratory distress. No wheezes. She has no rales.  Abdominal: Soft. Bowel sounds are normal. No distension. There is no tenderness. There is no rebound.  Musculoskeletal: No edema. Has Callus in lateral side of Right Foot Lymphadenopathy: none Neurological: Alert and oriented to person, place, and time.  Skin: Skin is warm and dry.  Psychiatric: Normal mood and affect. Behavior is normal. Thought content normal.    Labs reviewed: Basic Metabolic Panel: Recent Labs    11/22/17 0000 09/05/18 1450  NA 138 141  K 4.5 4.1  CL 103 106  CO2 23 27  GLUCOSE 93 92  BUN 16 15  CREATININE 0.68 0.59*  CALCIUM 8.9 8.8  TSH 3.97 5.11*   Liver Function Tests: Recent Labs    11/22/17 0000 09/05/18 1450  AST 16 16  ALT 15 14  BILITOT 0.5 0.6  PROT 6.5 6.0*   No results for input(s): LIPASE, AMYLASE in the last 8760 hours. No results for input(s): AMMONIA in the last 8760 hours. CBC: Recent Labs    11/22/17 0000 09/05/18 1450  WBC 8.1 7.4  NEUTROABS  --  4,233  HGB 13.8 12.8  HCT 40.3 38.1  MCV 93.9 95.5  PLT 266 258   Lipid Panel: Recent Labs    11/22/17 0000 09/05/18 1450  CHOL 235* 213*  HDL 53 52  LDLCALC 157* 140*  TRIG 123 101  CHOLHDL 4.4 4.1   No results found for: HGBA1C  Procedures since last visit: No results found.  Assessment/Plan Callus of foot - Plan:  Try Salicylic Acid Patches If not better will  need shaving by Podiatry  SOB (shortness of breath) on exertion - Plan:  Did not want to see Pulmonologist right now Doing Exercise which is helping her I have told her to let me know if she starts feeling worse  HYPERLIPIDEMIA - Plan:  Continue Exercise and Diet modification LDL is trending down Will consider statin next visit  Hypothyroidism, - Plan: Mildily high Repeat TSH in 3 months  Senile osteoporosis - Plan:  Tolerating Fosamax DEXA due in 2021 VIT B12 Def Level was WNL Continue PO supplement Colonoscopy in 05/19 was negative Anemia Resolved now     Labs/tests ordered: TSH in 3 months And repeat labs in 6 months  Next appt:  6 months   Total time spent in this patient care encounter was  40_  minutes; greater  than 50% of the visit spent counseling patient and staff, reviewing records , Labs and coordinating care for problems addressed at this encounter.

## 2018-10-02 ENCOUNTER — Encounter: Payer: Self-pay | Admitting: Internal Medicine

## 2018-10-09 ENCOUNTER — Institutional Professional Consult (permissible substitution): Payer: Medicare Other | Admitting: Pulmonary Disease

## 2018-10-21 NOTE — Progress Notes (Signed)
Subjective:   PATIENT ID: Jessica [name] GENDER: female DOB: [birth year], MRN: [id number]   HPI  Chief Complaint  Patient presents with  . Consult    intermittant difficulty taking and catching breath since 1-2 years ago    Reason for Visit: New consult for shortness of breath  [name] is a 77 year old female never smoker with HLD, hypothyroidism, history of pneumothorax and CKD who presents with shortness of breath  She reports that she has had shortness of breath and difficulty taking a deep breath for at least the last year.  However this began to worsen around the wintertime. In December, she had presented to the urgent care clinic with cough, shortness of breath, congestion and fatigue diagnosed with a viral upper respiratory infection for which she was treated with Tessalon Perles.  She had to be evaluated again for persistent symptoms and only after 2 courses of prednisone that her cough and congestion resolved.  She completed her steroids in mid January.  Although she had more acute symptoms of shortness of breath in the wintertime, she feels that her dyspnea has been associated with falls including in August and also in December.  She describes persistent difficulty in taking deep breaths.  Not associated with activity.  Denies pleuritic chest pain. She reports she is notices she is unable to take a deep breath on most days of the week.  This symptom seems to worsen with congestion.  Denies associated cough or wheezing. Claritin and flonase would help with nasal congestion but not necessarily improve her cough.  Denies any recent fevers, chills, chest pain, sputum production.  She does express concern about seeing the word atelectasis on prior imaging and wonders if this is contributing to her symptoms.  Social History: Never smoker  Environmental exposures: Denies any known exposures  I have personally reviewed patient's past medical/family/social history,  allergies, current medications.  Past Medical History:  Diagnosis Date  . Anemia 12/2015  . Bronchitis, chronic ([place])    in her 68s  . Cataract   . Chronic kidney disease    in 44s had nephritis  . Hearing loss 05/03/2016  . High cholesterol   . Hypothyroidism   . Osteopenia   . Pericarditis 01/1999  . Pneumothorax 01/1999  . Thyroid disorder   . Vitamin B 12 deficiency      Family History  Problem Relation Age of Onset  . Diabetes Mother   . Asthma Mother   . Ataxia Father   . Stroke Father   . Cancer - Cervical Sister   . Thyroid disease Sister   . Diabetes Other   . Diabetes Maternal Grandmother   . Arthritis Paternal Grandmother   . Colon cancer Neg Hx      Social History   Occupational History  . Occupation: retired  Tobacco Use  . Smoking status: Never Smoker  . Smokeless tobacco: Never Used  Substance and Sexual Activity  . Alcohol use: Yes    Alcohol/week: 3.0 standard drinks    Types: 3 Glasses of wine per week  . Drug use: No  . Sexual activity: Not on file    Allergies  Allergen Reactions  . Atorvastatin     Muscle weakness  . Rosuvastatin     Muscle weaknes     Outpatient Medications Prior to Visit  Medication Sig Dispense Refill  . alendronate (FOSAMAX) 70 MG tablet Take 1 tablet (70 mg total) by mouth every 7 (seven) days.  Take with a full glass of water on an empty stomach. 4 tablet 11  . calcium carbonate (OS-CAL) 1250 (500 Ca) MG chewable tablet Chew 1 tablet (1,250 mg total) by mouth daily. (Patient taking differently: Chew 1 tablet by mouth daily. Not a chewable) 90 tablet 3  . cholecalciferol (VITAMIN D) 1000 UNITS tablet Take 2,000 Units by mouth daily.     . clobetasol ointment (TEMOVATE) 2.63 % Apply 1 application topically every other day.     Marland Kitchen FERROUS SULFATE PO Take 1 tablet by mouth daily.    Marland Kitchen l-methylfolate-B6-B12 (METANX) 3-35-2 MG TABS tablet Take 1 tablet by mouth daily. 90 tablet 1  . levothyroxine (SYNTHROID,  LEVOTHROID) 125 MCG tablet Take 1 tablet (125 mcg total) by mouth daily. 90 tablet 3  . Multiple Vitamins-Minerals (MULTIVITAMIN WITH MINERALS) tablet Take 1 tablet by mouth daily.    . Red Yeast Rice 600 MG TABS Take 1 tablet by mouth daily.     . vitamin C (ASCORBIC ACID) 500 MG tablet Take 500 mg by mouth daily.     No facility-administered medications prior to visit.     Review of Systems  Constitutional: Positive for malaise/fatigue. Negative for chills, diaphoresis, fever and weight loss.  HENT: Positive for sore throat. Negative for congestion and ear pain.   Respiratory: Positive for shortness of breath. Negative for cough, hemoptysis, sputum production and wheezing.   Cardiovascular: Negative for chest pain, palpitations and leg swelling.  Gastrointestinal: Negative for abdominal pain, heartburn and nausea.  Genitourinary: Negative for frequency.  Musculoskeletal: Negative for joint pain and myalgias.  Skin: Negative for itching and rash.  Neurological: Negative for dizziness, weakness and headaches.  Endo/Heme/Allergies: Does not bruise/bleed easily.  Psychiatric/Behavioral: Negative for depression. The patient is not nervous/anxious.      Objective:   Vitals:   10/22/18 1101 10/22/18 1103  BP:  118/86  Pulse:  70  Temp: (!) 97.3 F (36.3 C)   TempSrc: Oral   SpO2:  95%  Weight: 152 lb 12.8 oz (69.3 kg)   Height: 5' 2.25" (1.581 m)    SpO2: 95 % O2 Device: None (Room air)  Physical Exam: General: Well-appearing, no acute distress HENT: [place], AT Eyes: EOMI, no scleral icterus Respiratory: Clear to auscultation bilaterally.  No crackles, wheezing or rales Cardiovascular: RRR, -M/R/G, no JVD GI: BS+, soft, nontender Extremities:-Edema,-tenderness Neuro: AAO x4, CNII-XII grossly intact Skin: Intact, no rashes or bruising Psych: Normal mood, normal affect  Data Reviewed:  Imaging: CT (PE protocol) 02/02/1999 - (Summarized report) No evidence of PE. Presence of  pericardial effusion. B/L pleural effusions L>R CXR 02/17/18 - No acute findings. No infiltrate, edema or effusion.  PFT: None on file  Labs: CBC    Component Value Date/Time   WBC 7.4 09/05/2018 1450   RBC 3.99 09/05/2018 1450   HGB 12.8 09/05/2018 1450   HGB 13.1 04/27/2017   HCT 38.1 09/05/2018 1450   HCT 37.7 04/27/2017   PLT 258 09/05/2018 1450   MCV 95.5 09/05/2018 1450   MCV 85.7 10/08/2014 0916   MCH 32.1 09/05/2018 1450   MCHC 33.6 09/05/2018 1450   RDW 13.1 09/05/2018 1450   LYMPHSABS 2,457 09/05/2018 1450   MONOABS 609 05/30/2016 0801   EOSABS 192 09/05/2018 1450   BASOSABS 52 09/05/2018 1450   BMET    Component Value Date/Time   NA 141 09/05/2018 1450   NA 140 04/27/2017   K 4.1 09/05/2018 1450   K 4.3 04/27/2017  CL 106 09/05/2018 1450   CO2 27 09/05/2018 1450   GLUCOSE 92 09/05/2018 1450   BUN 15 09/05/2018 1450   BUN 13 04/27/2017   CREATININE 0.59 (L) 09/05/2018 1450   CALCIUM 8.8 09/05/2018 1450   CALCIUM 9.0 04/27/2017   GFRNONAA 88 09/05/2018 1450   GFRAA 102 09/05/2018 1450    Imaging, labs and tests noted above have been reviewed independently by me.    Assessment & Plan:  Shortness of breath  Discussion: 78 year old female never smoker with self-reported difficulty taking deep breaths.  O2 saturation in office is normal.  No concern for acute illness based on history or exam.  Low suspicion for organic cause of her symptoms.  Low suspicion for recurrent pneumothorax.  Her shortness of breath seem to be limited to when she is self-aware of inability to take deep breaths.  Offered to do additional testing including pulmonary function tests however I did express that this would be low yield.  Patient did not wish for aggressive work-up.  Patient declined bronchodilator therapy.  Declined incentive spirometer.  I offered reassurance and advised her to please call our office if her symptoms progress or worsen.  Recommended daily deep breathing  exercises Encouraged regular aerobic activity  No orders of the defined types were placed in this encounter. No orders of the defined types were placed in this encounter.   Return if symptoms worsen or fail to improve.  [place], MD [facility] 10/22/2018 7:46 PM  Office Number [phone]

## 2018-10-22 ENCOUNTER — Encounter: Payer: Self-pay | Admitting: Pulmonary Disease

## 2018-10-22 ENCOUNTER — Other Ambulatory Visit: Payer: Self-pay

## 2018-10-22 ENCOUNTER — Ambulatory Visit (INDEPENDENT_AMBULATORY_CARE_PROVIDER_SITE_OTHER): Payer: Medicare Other | Admitting: Pulmonary Disease

## 2018-10-22 VITALS — BP 118/86 | HR 70 | Temp 97.3°F | Ht 62.25 in | Wt 152.8 lb

## 2018-10-22 DIAGNOSIS — R0600 Dyspnea, unspecified: Secondary | ICD-10-CM

## 2018-10-22 DIAGNOSIS — J9811 Atelectasis: Secondary | ICD-10-CM | POA: Diagnosis not present

## 2018-10-22 NOTE — Patient Instructions (Signed)
Atelectasis, Adult  Atelectasis is a collapse of air sacs in the lungs (alveoli). The condition causes all or part of a lung to collapse. Atelectasis is a common problem after surgery. Its severity depends on the size of lung tissue area involved and the underlying cause. When severe, it can lead to shortness of breath and heart problems. Atelectasis can develop suddenly or over a long period of time. Atelectasis that develops over a long period of time (chronic atelectasis) often leads to infection, scarring, and other problems. What are the causes? This condition may be caused by:  Shallow breathing.  Medicines that make breathing more shallow.  A blockage in an airway. Blockages can result from: ? A buildup of mucus. ? A tumor. ? An inhaled object (foreign body). ? Enlarged lymph nodes. ? Fluid in the lungs (pleural effusion). ? A blood clot in the lungs.  Outside pressure on the lung. Pressure can be due to: ? A tumor. ? Fluid in the lungs (pleural effusion). ? Air leaking between the lung and rib cage (pneumothorax). ? Enlarged lymph nodes.  Improper expansion of the lungs. This may occur in newborns because of: ? Prematurity. ? Low oxygen levels. ? Secretions at birth that block the airway. ? Amniotic fluid that goes into the lungs (aspiration). What increases the risk? This condition is more likely to develop in people who:  Have an injury or health problem that makes taking deep breaths difficult or painful.  Have certain infections or diseases, such as pneumonia or cystic fibrosis.  Have had surgery on the chest or abdomen.  Have broken ribs.  Have a tight bandage around their chest.  Have a collapsed lung due to pneumothorax.  Take medicines that decrease the rate of their breathing or how deeply they breathe, like sedatives.  Lie flat for long periods of time. What are the signs or symptoms? Often, there are no symptoms for this condition. When symptoms do  appear, they may include:  Shortness of breath.  Bluish color to the nails, lips, or mouth (cyanosis).  A cough. How is this diagnosed? This condition may be diagnosed based on:  Symptoms.  A physical exam.  A chest X-ray. Sometimes specialized imaging tests are needed to diagnose the condition. How is this treated? Treatment for this condition depends on what caused the condition. Treatment may involve:  Coughing. Coughing helps loosen mucus in the airway.  Chest physiotherapy. This is a treatment to help loosen and clear mucus from the airways. It is done by clapping the chest.  Postural drainage techniques. This treatment involves positioning your body so your head is lower than your chest. It helps mucus drain from your airways.  An incentive spirometer. This is a device that is used to help with taking deeper breaths.  Positive pressure breathing. This is a form of breathing assistance in which air is forced into the lungs when you breathe in (inhale). You may have this treatment if your condition is severe.  Treatment of the underlying condition. Follow these instructions at home:  Take over-the-counter and prescription medicines only as told by your health care provider.  Practice taking relaxed and deep breaths when you are sitting. A good time to practice is when you are watching TV. Take a few deep breaths during each commercial break.  Make sure to lie on your unaffected side when you are lying down. For example, if you have atelectasis in your left lung, lie on your right side. This will help  mucus drain from your airway.  Cough several times a day as told by your health care provider.  Perform chest physiotherapy or postural drainage techniques as told by your health care provider. If necessary, have someone help you.  If you were given a device to help with breathing, use it as told by your health care provider.  Stay as active as possible. Get help right  away if:  Your breathing problems get worse.  You have severe chest pain.  You develop severe coughing.  You cough up blood.  You have a fever.  You have persistent symptoms for more than 2-3 days.  Your symptoms suddenly get worse. This information is not intended to replace advice given to you by your health care provider. Make sure you discuss any questions you have with your health care provider. Document Released: 02/21/2005 Document Revised: 02/03/2017 Document Reviewed: 07/27/2015 Elsevier Patient Education  2020 [employer].

## 2018-11-01 DIAGNOSIS — Z23 Encounter for immunization: Secondary | ICD-10-CM | POA: Diagnosis not present

## 2018-11-23 ENCOUNTER — Encounter: Payer: Self-pay | Admitting: Internal Medicine

## 2018-11-23 NOTE — Telephone Encounter (Signed)
Please advise, medication requested is not on medication list

## 2018-11-26 NOTE — Telephone Encounter (Signed)
I had Approved the Med

## 2018-11-27 DIAGNOSIS — L900 Lichen sclerosus et atrophicus: Secondary | ICD-10-CM | POA: Diagnosis not present

## 2018-12-13 ENCOUNTER — Other Ambulatory Visit: Payer: Medicare Other

## 2018-12-13 ENCOUNTER — Other Ambulatory Visit: Payer: Self-pay

## 2018-12-13 DIAGNOSIS — E039 Hypothyroidism, unspecified: Secondary | ICD-10-CM | POA: Diagnosis not present

## 2018-12-14 LAB — TSH: TSH: 0.88 mIU/L (ref 0.40–4.50)

## 2019-02-04 DIAGNOSIS — G43909 Migraine, unspecified, not intractable, without status migrainosus: Secondary | ICD-10-CM | POA: Diagnosis not present

## 2019-03-14 ENCOUNTER — Other Ambulatory Visit: Payer: Self-pay

## 2019-03-14 ENCOUNTER — Other Ambulatory Visit: Payer: Medicare Other

## 2019-03-14 DIAGNOSIS — E039 Hypothyroidism, unspecified: Secondary | ICD-10-CM

## 2019-03-14 DIAGNOSIS — R0602 Shortness of breath: Secondary | ICD-10-CM

## 2019-03-14 DIAGNOSIS — E782 Mixed hyperlipidemia: Secondary | ICD-10-CM

## 2019-03-15 LAB — COMPLETE METABOLIC PANEL WITH GFR
AG Ratio: 1.5 (calc) (ref 1.0–2.5)
ALT: 17 U/L (ref 6–29)
AST: 19 U/L (ref 10–35)
Albumin: 3.8 g/dL (ref 3.6–5.1)
Alkaline phosphatase (APISO): 52 U/L (ref 37–153)
BUN/Creatinine Ratio: 25 (calc) — ABNORMAL HIGH (ref 6–22)
BUN: 15 mg/dL (ref 7–25)
CO2: 23 mmol/L (ref 20–32)
Calcium: 9 mg/dL (ref 8.6–10.4)
Chloride: 104 mmol/L (ref 98–110)
Creat: 0.59 mg/dL — ABNORMAL LOW (ref 0.60–0.93)
GFR, Est African American: 102 mL/min/{1.73_m2} (ref >=60)
GFR, Est Non African American: 88 mL/min/{1.73_m2} (ref >=60)
Globulin: 2.5 g/dL (calc) (ref 1.9–3.7)
Glucose, Bld: 99 mg/dL (ref 65–99)
Potassium: 4.4 mmol/L (ref 3.5–5.3)
Sodium: 139 mmol/L (ref 135–146)
Total Bilirubin: 0.5 mg/dL (ref 0.2–1.2)
Total Protein: 6.3 g/dL (ref 6.1–8.1)

## 2019-03-15 LAB — CBC WITH DIFFERENTIAL/PLATELET
Absolute Monocytes: 595 cells/uL (ref 200–950)
Basophils Absolute: 60 cells/uL (ref 0–200)
Basophils Relative: 0.7 %
Eosinophils Absolute: 179 cells/uL (ref 15–500)
Eosinophils Relative: 2.1 %
HCT: 40.9 % (ref 35.0–45.0)
Hemoglobin: 14.1 g/dL (ref 11.7–15.5)
Lymphs Abs: 2661 cells/uL (ref 850–3900)
MCH: 33 pg (ref 27.0–33.0)
MCHC: 34.5 g/dL (ref 32.0–36.0)
MCV: 95.8 fL (ref 80.0–100.0)
MPV: 11.2 fL (ref 7.5–12.5)
Monocytes Relative: 7 %
Neutro Abs: 5007 cells/uL (ref 1500–7800)
Neutrophils Relative %: 58.9 %
Platelets: 296 10*3/uL (ref 140–400)
RBC: 4.27 10*6/uL (ref 3.80–5.10)
RDW: 12.5 % (ref 11.0–15.0)
Total Lymphocyte: 31.3 %
WBC: 8.5 10*3/uL (ref 3.8–10.8)

## 2019-03-15 LAB — TSH: TSH: 2.41 mIU/L (ref 0.40–4.50)

## 2019-03-15 LAB — LIPID PANEL
Cholesterol: 252 mg/dL — ABNORMAL HIGH (ref <200)
HDL: 52 mg/dL (ref >=50)
LDL Cholesterol (Calc): 176 mg/dL (calc) — ABNORMAL HIGH
Non-HDL Cholesterol (Calc): 200 mg/dL (calc) — ABNORMAL HIGH (ref <130)
Total CHOL/HDL Ratio: 4.8 (calc) (ref <5.0)
Triglycerides: 111 mg/dL (ref <150)

## 2019-03-20 ENCOUNTER — Non-Acute Institutional Stay: Payer: Medicare Other | Admitting: Internal Medicine

## 2019-03-20 ENCOUNTER — Encounter: Payer: Self-pay | Admitting: Internal Medicine

## 2019-03-20 ENCOUNTER — Other Ambulatory Visit: Payer: Self-pay

## 2019-03-20 VITALS — BP 108/64 | HR 69 | Temp 97.6°F | Ht 62.5 in | Wt 152.4 lb

## 2019-03-20 DIAGNOSIS — E782 Mixed hyperlipidemia: Secondary | ICD-10-CM | POA: Diagnosis not present

## 2019-03-20 DIAGNOSIS — R0602 Shortness of breath: Secondary | ICD-10-CM | POA: Diagnosis not present

## 2019-03-20 DIAGNOSIS — E039 Hypothyroidism, unspecified: Secondary | ICD-10-CM

## 2019-03-20 DIAGNOSIS — D509 Iron deficiency anemia, unspecified: Secondary | ICD-10-CM

## 2019-03-20 DIAGNOSIS — M810 Age-related osteoporosis without current pathological fracture: Secondary | ICD-10-CM

## 2019-03-20 DIAGNOSIS — E538 Deficiency of other specified B group vitamins: Secondary | ICD-10-CM

## 2019-03-20 MED ORDER — LORATADINE 10 MG PO TABS: 10.0000 mg | ORAL_TABLET | Freq: Every day | ORAL | 2 refills | Status: DC

## 2019-03-20 NOTE — Progress Notes (Signed)
Location:  [place] of Service:  Clinic (12)  Provider:   Code Status:  Goals of Care:  Advanced Directives 05/09/2018  Does Patient Have a Medical Advance Directive? Yes  Type of Advance Directive [place]  Does patient want to make changes to medical advance directive? No - Patient declined  Copy of [place] in Chart? Yes - validated most recent copy scanned in chart (See row information)  Pre-existing out of facility DNR order (yellow form or pink MOST form) -     Chief Complaint  Patient presents with  . Medical Management of Chronic Issues    Patient returns for follow up and discuss labs. She is having some hip pain and would like a new prescription for claritin.     HPI: Patient is a 79 y.o. female seen today for medical management of chronic diseases.   Osteoporosis On Fosamax Hyperlipidemia Says was not exercising and eating sweets recently Callus in the foot Does have issues but want to wait for Podiatry SOB Not having any discomfort. Was seen By Pulmonology. 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 Still drives. Got Covid Vaccine with no side effects   Past Medical History:  Diagnosis Date  . Anemia 12/2015  . Bronchitis, chronic ([place])    in her 69s  . Cataract   . Chronic kidney disease    in 65s had nephritis  . Hearing loss 05/03/2016  . High cholesterol   . Hypothyroidism   . Osteopenia   . Pericarditis 01/1999  . Pneumothorax 01/1999  . Thyroid disorder   . Vitamin B 12 deficiency     Past Surgical History:  Procedure Laterality Date  . birthmark      removed,left leg  . CATARACT EXTRACTION  2017  . [place]?    Allergies  Allergen Reactions  . Atorvastatin     Muscle weakness  . Rosuvastatin     Muscle weaknes    Outpatient Encounter Medications as of 03/20/2019  Medication Sig  . alendronate (FOSAMAX) 70  MG tablet Take 1 tablet (70 mg total) by mouth every 7 (seven) days. Take with a full glass of water on an empty stomach.  . calcium carbonate (OS-CAL) 1250 (500 Ca) MG chewable tablet Chew 1 tablet (1,250 mg total) by mouth daily. (Patient taking differently: Chew 1 tablet by mouth daily. Not a chewable)  . cholecalciferol (VITAMIN D) 1000 UNITS tablet Take 2,000 Units by mouth daily.   . clobetasol ointment (TEMOVATE) AB-123456789 % Apply 1 application topically every other day.   Marland Kitchen FERROUS SULFATE PO Take 1 tablet by mouth daily.  Marland Kitchen l-methylfolate-B6-B12 (METANX) 3-35-2 MG TABS tablet Take 1 tablet by mouth daily.  Marland Kitchen levothyroxine (SYNTHROID, LEVOTHROID) 125 MCG tablet Take 1 tablet (125 mcg total) by mouth daily.  Marland Kitchen loratadine (CLARITIN) 10 MG tablet Take 10 mg by mouth daily.  . Multiple Vitamins-Minerals (MULTIVITAMIN WITH MINERALS) tablet Take 1 tablet by mouth daily.  . Red Yeast Rice 600 MG TABS Take 1 tablet by mouth daily.   . vitamin C (ASCORBIC ACID) 500 MG tablet Take 500 mg by mouth daily.   No facility-administered encounter medications on file as of 03/20/2019.    Review of Systems:  Review of Systems  Review of Systems  Constitutional: Negative for activity change, appetite change, chills, diaphoresis, fatigue and fever.  HENT: Negative for mouth  sores, postnasal drip, rhinorrhea, sinus pain and sore throat.   Respiratory: Negative for apnea, cough, chest tightness, shortness of breath and wheezing.   Cardiovascular: Negative for chest pain, palpitations and leg swelling.  Gastrointestinal: Negative for abdominal distention, abdominal pain, constipation, diarrhea, nausea and vomiting.  Genitourinary: Negative for dysuria and frequency.  Musculoskeletal: Negative for arthralgias, joint swelling and myalgias.  Skin: Negative for rash.  Neurological: Negative for dizziness, syncope, weakness, light-headedness and numbness.  Psychiatric/Behavioral: Negative for behavioral problems,  confusion and sleep disturbance.     Health Maintenance  Topic Date Due  . COLONOSCOPY  07/11/2020  . TETANUS/TDAP  05/30/2028  . INFLUENZA VACCINE  Completed  . DEXA SCAN  Completed  . PNA vac Low Risk Adult  Completed    Physical Exam: Vitals:   03/20/19 1334  BP: 108/64  Pulse: 69  Temp: 97.6 F (36.4 C)  SpO2: 97%  Weight: 152 lb 6.4 oz (69.1 kg)  Height: 5' 2.5" (1.588 m)   Body mass index is 27.43 kg/m. Physical Exam  Constitutional: Oriented to person, place, and time. Well-developed and well-nourished.  HENT:  Head: Normocephalic.  Mouth/Throat: Oropharynx is clear and moist. Ears with no wax Eyes: Pupils are equal, round, and reactive to light.  Neck: Neck supple.  Breast Exam Was normal with no Masses Felt Cardiovascular: Normal rate and normal heart sounds.  No murmur heard. Pulmonary/Chest: Effort normal and breath sounds normal. No respiratory distress. No wheezes. She has no rales.  Abdominal: Soft. Bowel sounds are normal. No distension. There is no tenderness. There is no rebound.  Musculoskeletal: No edema.  Lymphadenopathy: none Neurological: Alert and oriented to person, place, and time.  Gait is stable. No Focal Deficits Skin: Skin is warm and dry.  Psychiatric: Normal mood and affect. Behavior is normal. Thought content normal.    Labs reviewed: Basic Metabolic Panel: Recent Labs    09/05/18 1450 12/13/18 0000 03/14/19 0810  NA 141  --  139  K 4.1  --  4.4  CL 106  --  104  CO2 27  --  23  GLUCOSE 92  --  99  BUN 15  --  15  CREATININE 0.59*  --  0.59*  CALCIUM 8.8  --  9.0  TSH 5.11* 0.88 2.41   Liver Function Tests: Recent Labs    09/05/18 1450 03/14/19 0810  AST 16 19  ALT 14 17  BILITOT 0.6 0.5  PROT 6.0* 6.3   No results for input(s): LIPASE, AMYLASE in the last 8760 hours. No results for input(s): AMMONIA in the last 8760 hours. CBC: Recent Labs    09/05/18 1450 03/14/19 0810  WBC 7.4 8.5  NEUTROABS 4,233  5,007  HGB 12.8 14.1  HCT 38.1 40.9  MCV 95.5 95.8  PLT 258 296   Lipid Panel: Recent Labs    09/05/18 1450 03/14/19 0810  CHOL 213* 252*  HDL 52 52  LDLCALC 140* 176*  TRIG 101 111  CHOLHDL 4.1 4.8   No results found for: HGBA1C  Procedures since last visit: No results found.  Assessment/Plan  HYPERLIPIDEMIA No Risk factor. No Family history No hypertension  We did discuss about the [place] She still want to wait till next visit  Wants to try Diet and Exercise  SOB (shortness of breath) on exertion Saw Pulmonologist No New Recommendations But patient says she feels good. Does her Breathing exercise Allergic Rhinitis Claritin Prescribed  Hypothyroidism, unspecified type TSH was normal Continue Same dose of  Synthyroid  Senile osteoporosis Continue on Fosamax Repeat DEXA in 7/21  Vitamin B 12 deficiency Levels Normal Continue Supplements Iron deficiency anemia, unspecified iron deficiency anemia type Hgb Normal Can reduce her Iron dose Colonoscopy in 5/19 was negative Callus Does not want to see Podiatry right now  Upto date on Vaccinations Does not want Mammogram anymore Breast Exam was normal today  Labs/tests ordered:  * No order type specified * Next appt:  Visit date not found

## 2019-04-08 ENCOUNTER — Other Ambulatory Visit: Payer: Medicare Other

## 2019-04-08 ENCOUNTER — Other Ambulatory Visit (HOSPITAL_COMMUNITY)
Admission: RE | Admit: 2019-04-08 | Discharge: 2019-04-08 | Disposition: A | Discharge status: Home / Self Care | Payer: Medicare Other | Source: Ambulatory Visit | Attending: Orthopaedic Surgery | Admitting: Orthopaedic Surgery

## 2019-04-08 ENCOUNTER — Other Ambulatory Visit: Payer: Self-pay

## 2019-04-08 ENCOUNTER — Ambulatory Visit
Admission: RE | Admit: 2019-04-08 | Discharge: 2019-04-08 | Disposition: A | Discharge status: Home / Self Care | Payer: Medicare Other | Source: Ambulatory Visit | Attending: Orthopaedic Surgery | Admitting: Orthopaedic Surgery

## 2019-04-08 ENCOUNTER — Inpatient Hospital Stay: Admission: RE | Admit: 2019-04-08 | Payer: Medicare Other | Source: Ambulatory Visit

## 2019-04-08 ENCOUNTER — Encounter (HOSPITAL_BASED_OUTPATIENT_CLINIC_OR_DEPARTMENT_OTHER): Payer: Self-pay | Admitting: Orthopaedic Surgery

## 2019-04-08 ENCOUNTER — Other Ambulatory Visit: Payer: Self-pay | Admitting: Orthopaedic Surgery

## 2019-04-08 DIAGNOSIS — Z20822 Contact with and (suspected) exposure to covid-19: Secondary | ICD-10-CM | POA: Insufficient documentation

## 2019-04-08 DIAGNOSIS — Z01812 Encounter for preprocedural laboratory examination: Secondary | ICD-10-CM | POA: Insufficient documentation

## 2019-04-08 DIAGNOSIS — M25521 Pain in right elbow: Secondary | ICD-10-CM

## 2019-04-08 LAB — SARS CORONAVIRUS 2 (TAT 6-24 HRS): SARS Coronavirus 2: NEGATIVE

## 2019-04-09 NOTE — H&P (Signed)
PREOPERATIVE H&P  Chief Complaint: DISPLACE SIMPLE SUPRACONDYLAR FRACTURE WITHOUT INTERCAONDYLAR FRACTUE UNSPEIFIED HUMERUS  HPI: Jessica [name] is a 79 y.o. female who is scheduled for RIGHT OPEN REDUCTION INTERNAL FIXATION (ORIF) DISTAL HUMERUS FRACTURE WITH EXTENSION.   Patient has a past medical history significant for hypothyroidism, hyperlipidemia, and anemia.   Patient fell at home in the mud on 04/05/2019. She landed on her right elbow. She went to the Urgent Care. X-rays showed comminuted intra-articular distal humerus fracture. She was placed in a sling and told to follow-up with Dr. Griffin Basil. CT scan showed bicolumnar distal humerus fracture.   Her symptoms are rated as moderate to severe, and have been worsening.  This is significantly impairing activities of daily living.    Please see clinic note for further details on this patient's care.    She has elected for surgical management.   Past Medical History:  Diagnosis Date  . Anemia 12/2015  . Bronchitis, chronic ([place])    in her 39s  . Cataract   . Chronic kidney disease    in 104s had nephritis  . Closed fracture of right distal humerus    right  . Hearing loss 05/03/2016  . High cholesterol   . Hypothyroidism   . Osteopenia   . Pericarditis 01/1999  . Pneumothorax 01/1999  . Thyroid disorder   . Vitamin B 12 deficiency    Past Surgical History:  Procedure Laterality Date  . birthmark      removed,left leg  . CATARACT EXTRACTION  2017  . [place]?   Social History   Socioeconomic History  . Marital status: Divorced    Spouse name: Not on file  . Number of children: 1  . Years of education: college  . Highest education level: Not on file  Occupational History  . Occupation: retired  Tobacco Use  . Smoking status: Never Smoker  . Smokeless tobacco: Never Used  Substance and Sexual Activity  . Alcohol use: Yes    Alcohol/week: 3.0 standard drinks    Types: 3  Glasses of wine per week    Comment: social  . Drug use: No  . Sexual activity: Not on file  Other Topics Concern  . Not on file  Social History Narrative   Moved to [facility] 01/14/2015   Do you drink/eat things with caffeine?  yes   Marital status?      divorced                               Do you live in a house, apartment, assisted living, condo, trailer, etc.?  Retirement community   Is it one or more stories? Yes, 3   How many persons live in your home?  1   Do you have any pets in your home? (please list) 0   Current or past profession:  Librarian   Do you exercise?     yes                                 Type & how often? Walk-varies   Do you have a living will? yes   Do you have a DNR form?         no  If not, do you want to discuss one? yes   Do you have signed POA/HPOA for forms? yes   Social Determinants of Health   Financial Resource Strain:   . Difficulty of Paying Living Expenses: Not on file  Food Insecurity:   . Worried About Charity fundraiser in the Last Year: Not on file  . Ran Out of Food in the Last Year: Not on file  Transportation Needs:   . Lack of Transportation (Medical): Not on file  . Lack of Transportation (Non-Medical): Not on file  Physical Activity:   . Days of Exercise per Week: Not on file  . Minutes of Exercise per Session: Not on file  Stress:   . Feeling of Stress : Not on file  Social Connections:   . Frequency of Communication with Friends and Family: Not on file  . Frequency of Social Gatherings with Friends and Family: Not on file  . Attends Religious Services: Not on file  . Active Member of Clubs or Organizations: Not on file  . Attends Archivist Meetings: Not on file  . Marital Status: Not on file   Family History  Problem Relation Age of Onset  . Diabetes Mother   . Asthma Mother   . Ataxia Father   . Stroke Father   . Cancer - Cervical Sister   . Thyroid disease Sister   .  Diabetes Other   . Diabetes Maternal Grandmother   . Arthritis Paternal Grandmother   . Colon cancer Neg Hx    Allergies  Allergen Reactions  . Atorvastatin     Muscle weakness  . Rosuvastatin     Muscle weaknes   Prior to Admission medications   Medication Sig Start Date End Date Taking? Authorizing Provider  alendronate (FOSAMAX) 70 MG tablet Take 1 tablet (70 mg total) by mouth every 7 (seven) days. Take with a full glass of water on an empty stomach. 08/13/18  Yes Virgie Dad, MD  calcium carbonate (OS-CAL) 1250 (500 Ca) MG chewable tablet Chew 1 tablet (1,250 mg total) by mouth daily. Patient taking differently: Chew 1 tablet by mouth daily. Not a chewable 09/11/17  Yes Pandey, Mahima, MD  cholecalciferol (VITAMIN D) 1000 UNITS tablet Take 2,000 Units by mouth daily.    Yes [provider]  clobetasol ointment (TEMOVATE) AB-123456789 % Apply 1 application topically every other day.    Yes [provider]  FERROUS SULFATE PO Take 1 tablet by mouth daily.   Yes [provider]  l-methylfolate-B6-B12 (METANX) 3-35-2 MG TABS tablet Take 1 tablet by mouth daily. 07/12/18  Yes Virgie Dad, MD  levothyroxine (SYNTHROID, LEVOTHROID) 125 MCG tablet Take 1 tablet (125 mcg total) by mouth daily. 06/07/18  Yes Virgie Dad, MD  loratadine (CLARITIN) 10 MG tablet Take 1 tablet (10 mg total) by mouth daily. 03/20/19  Yes Virgie Dad, MD  Multiple Vitamins-Minerals (MULTIVITAMIN WITH MINERALS) tablet Take 1 tablet by mouth daily.   Yes [provider]  Red Yeast Rice 600 MG TABS Take 1 tablet by mouth daily.    Yes [provider]  vitamin C (ASCORBIC ACID) 500 MG tablet Take 500 mg by mouth daily.   Yes [provider]    ROS: All other systems have been reviewed and were otherwise negative with the exception of those mentioned in the HPI and as above.  Physical Exam: General: Alert, no acute distress Cardiovascular: No pedal  edema Respiratory: No cyanosis,  no use of accessory musculature GI: No organomegaly, abdomen is soft and non-tender Skin: No lesions in the area of chief complaint Neurologic: Sensation intact distally Psychiatric: Patient is competent for consent with normal mood and affect Lymphatic: No axillary or cervical lymphadenopathy  MUSCULOSKELETAL:  Right elbow: ROM not tested in setting of recent fracture. Swelling about the right elbow. Distal motor and sensory function intact.   Imaging: CT of right elbow: Acute comminuted displaced and impacted bicolumnar intra-articular fracture of the distal humerus. Oblique fracture line through the lateral epicondyle and capitellum. Oblique fracture line through the medial epicondyle and trochlea. Additional longitudinal fracture line through the medial epicondyle which does not entirely separate the trochlea.  Assessment: DISPLACE SIMPLE SUPRACONDYLAR FRACTURE WITHOUT INTERCAONDYLAR FRACTUE UNSPEIFIED HUMERUS  Plan: Plan for Procedure(s): RIGHT OPEN REDUCTION INTERNAL FIXATION (ORIF) DISTAL HUMERUS FRACTURE WITH EXTENSION  The risks benefits and alternatives were discussed with the patient including but not limited to the risks of nonoperative treatment, versus surgical intervention including infection, bleeding, nerve injury,  blood clots, cardiopulmonary complications, morbidity, mortality, among others, and they were willing to proceed.   The patient acknowledged the explanation, agreed to proceed with the plan and consent was signed.   Operative Plan: ORIF right distal humerus fracture  Discharge Medications: Tylenol, Celebrex, Zofran, Oxycodone  DVT Prophylaxis: None Physical Therapy: +/- outpatient PT Special Discharge needs: Splint  Ethelda Chick, PA-C  04/09/2019 8:08 AM

## 2019-04-10 ENCOUNTER — Ambulatory Visit (HOSPITAL_COMMUNITY): Payer: Medicare Other

## 2019-04-10 ENCOUNTER — Encounter (HOSPITAL_BASED_OUTPATIENT_CLINIC_OR_DEPARTMENT_OTHER)
Admission: RE | Disposition: A | Discharge status: Home / Self Care | Payer: Self-pay | Source: Ambulatory Visit | Attending: Orthopaedic Surgery

## 2019-04-10 ENCOUNTER — Encounter (HOSPITAL_BASED_OUTPATIENT_CLINIC_OR_DEPARTMENT_OTHER): Payer: Self-pay | Admitting: Orthopaedic Surgery

## 2019-04-10 ENCOUNTER — Ambulatory Visit (HOSPITAL_BASED_OUTPATIENT_CLINIC_OR_DEPARTMENT_OTHER): Payer: Medicare Other | Admitting: Anesthesiology

## 2019-04-10 ENCOUNTER — Ambulatory Visit (HOSPITAL_BASED_OUTPATIENT_CLINIC_OR_DEPARTMENT_OTHER)
Admission: RE | Admit: 2019-04-10 | Discharge: 2019-04-11 | Disposition: A | Discharge status: Home / Self Care | Payer: Medicare Other | Source: Ambulatory Visit | Attending: Orthopaedic Surgery | Admitting: Orthopaedic Surgery

## 2019-04-10 ENCOUNTER — Other Ambulatory Visit: Payer: Self-pay

## 2019-04-10 DIAGNOSIS — Z79890 Hormone replacement therapy (postmenopausal): Secondary | ICD-10-CM | POA: Diagnosis not present

## 2019-04-10 DIAGNOSIS — W010XXA Fall on same level from slipping, tripping and stumbling without subsequent striking against object, initial encounter: Secondary | ICD-10-CM | POA: Diagnosis not present

## 2019-04-10 DIAGNOSIS — Z7983 Long term (current) use of bisphosphonates: Secondary | ICD-10-CM | POA: Diagnosis not present

## 2019-04-10 DIAGNOSIS — S42491A Other displaced fracture of lower end of right humerus, initial encounter for closed fracture: Secondary | ICD-10-CM | POA: Diagnosis not present

## 2019-04-10 DIAGNOSIS — Z419 Encounter for procedure for purposes other than remedying health state, unspecified: Secondary | ICD-10-CM

## 2019-04-10 DIAGNOSIS — Y92009 Unspecified place in unspecified non-institutional (private) residence as the place of occurrence of the external cause: Secondary | ICD-10-CM | POA: Diagnosis not present

## 2019-04-10 DIAGNOSIS — S42401A Unspecified fracture of lower end of right humerus, initial encounter for closed fracture: Secondary | ICD-10-CM | POA: Diagnosis present

## 2019-04-10 DIAGNOSIS — E039 Hypothyroidism, unspecified: Secondary | ICD-10-CM | POA: Diagnosis not present

## 2019-04-10 DIAGNOSIS — Z79899 Other long term (current) drug therapy: Secondary | ICD-10-CM | POA: Insufficient documentation

## 2019-04-10 DIAGNOSIS — Z09 Encounter for follow-up examination after completed treatment for conditions other than malignant neoplasm: Secondary | ICD-10-CM

## 2019-04-10 HISTORY — PX: ORIF HUMERUS FRACTURE: SHX2126

## 2019-04-10 HISTORY — DX: Unspecified fracture of lower end of right humerus, initial encounter for closed fracture: S42.401A

## 2019-04-10 SURGERY — OPEN REDUCTION INTERNAL FIXATION (ORIF) DISTAL HUMERUS FRACTURE
Anesthesia: General | Site: Elbow | Laterality: Right

## 2019-04-10 MED ORDER — CELECOXIB 100 MG PO CAPS: 100.0000 mg | ORAL_CAPSULE | Freq: Two times a day (BID) | ORAL | 1 refills | Status: AC

## 2019-04-10 MED ORDER — FENTANYL CITRATE (PF) 100 MCG/2ML IJ SOLN
50.0000 ug | INTRAMUSCULAR | Status: DC | PRN
  Administered 2019-04-10: 50 ug via INTRAVENOUS

## 2019-04-10 MED ORDER — LEVOTHYROXINE SODIUM 125 MCG PO TABS: 125.0000 ug | ORAL_TABLET | Freq: Every day | ORAL | Status: DC

## 2019-04-10 MED ORDER — PROPOFOL 500 MG/50ML IV EMUL
INTRAVENOUS | Status: DC | PRN
  Administered 2019-04-10: 15 ug/kg/min via INTRAVENOUS

## 2019-04-10 MED ORDER — ACETAMINOPHEN 500 MG PO TABS
1000.0000 mg | ORAL_TABLET | Freq: Once | ORAL | Status: AC
  Administered 2019-04-10: 1000 mg via ORAL

## 2019-04-10 MED ORDER — LIDOCAINE 2% (20 MG/ML) 5 ML SYRINGE
INTRAMUSCULAR | Status: DC | PRN
  Administered 2019-04-10: 75 mg via INTRAVENOUS

## 2019-04-10 MED ORDER — CEFAZOLIN SODIUM-DEXTROSE 2-4 GM/100ML-% IV SOLN
INTRAVENOUS | Status: AC
  Filled 2019-04-10: qty 100

## 2019-04-10 MED ORDER — ROCURONIUM BROMIDE 10 MG/ML (PF) SYRINGE
PREFILLED_SYRINGE | INTRAVENOUS | Status: AC
  Filled 2019-04-10: qty 10

## 2019-04-10 MED ORDER — ONDANSETRON HCL 4 MG/2ML IJ SOLN: 4.0000 mg | Freq: Four times a day (QID) | INTRAMUSCULAR | Status: DC | PRN

## 2019-04-10 MED ORDER — ACETAMINOPHEN 500 MG PO TABS
1000.0000 mg | ORAL_TABLET | Freq: Three times a day (TID) | ORAL | Status: DC
  Administered 2019-04-10 – 2019-04-11 (×2): 1000 mg via ORAL
  Filled 2019-04-10 (×2): qty 2

## 2019-04-10 MED ORDER — ONDANSETRON HCL 4 MG/2ML IJ SOLN
INTRAMUSCULAR | Status: AC
  Filled 2019-04-10: qty 2

## 2019-04-10 MED ORDER — ROCURONIUM BROMIDE 100 MG/10ML IV SOLN
INTRAVENOUS | Status: DC | PRN
  Administered 2019-04-10: 20 mg via INTRAVENOUS
  Administered 2019-04-10: 40 mg via INTRAVENOUS
  Administered 2019-04-10: 20 mg via INTRAVENOUS

## 2019-04-10 MED ORDER — DEXAMETHASONE SODIUM PHOSPHATE 10 MG/ML IJ SOLN
INTRAMUSCULAR | Status: AC
  Filled 2019-04-10: qty 1

## 2019-04-10 MED ORDER — PROPOFOL 10 MG/ML IV BOLUS
INTRAVENOUS | Status: DC | PRN
  Administered 2019-04-10: 20 mg via INTRAVENOUS
  Administered 2019-04-10: 30 mg via INTRAVENOUS
  Administered 2019-04-10: 120 mg via INTRAVENOUS
  Administered 2019-04-10: 30 mg via INTRAVENOUS

## 2019-04-10 MED ORDER — FENTANYL CITRATE (PF) 100 MCG/2ML IJ SOLN
INTRAMUSCULAR | Status: AC
  Filled 2019-04-10: qty 2

## 2019-04-10 MED ORDER — ONDANSETRON HCL 4 MG/2ML IJ SOLN
INTRAMUSCULAR | Status: DC | PRN
  Administered 2019-04-10: 4 mg via INTRAVENOUS

## 2019-04-10 MED ORDER — OXYCODONE HCL 5 MG/5ML PO SOLN: 5.0000 mg | Freq: Once | ORAL | Status: DC | PRN

## 2019-04-10 MED ORDER — BISACODYL 5 MG PO TBEC: 5.0000 mg | DELAYED_RELEASE_TABLET | Freq: Every day | ORAL | Status: DC | PRN

## 2019-04-10 MED ORDER — ONDANSETRON HCL 4 MG PO TABS: 4.0000 mg | ORAL_TABLET | Freq: Three times a day (TID) | ORAL | 1 refills | Status: AC | PRN

## 2019-04-10 MED ORDER — SODIUM CHLORIDE 0.9 % IV SOLN: INTRAVENOUS | Status: DC

## 2019-04-10 MED ORDER — PROMETHAZINE HCL 25 MG/ML IJ SOLN: 6.2500 mg | INTRAMUSCULAR | Status: DC | PRN

## 2019-04-10 MED ORDER — FENTANYL CITRATE (PF) 100 MCG/2ML IJ SOLN: 25.0000 ug | INTRAMUSCULAR | Status: DC | PRN

## 2019-04-10 MED ORDER — ACETAMINOPHEN 500 MG PO TABS
ORAL_TABLET | ORAL | Status: AC
  Filled 2019-04-10: qty 2

## 2019-04-10 MED ORDER — FENTANYL CITRATE (PF) 100 MCG/2ML IJ SOLN
INTRAMUSCULAR | Status: DC | PRN
  Administered 2019-04-10: 50 ug via INTRAVENOUS

## 2019-04-10 MED ORDER — MIDAZOLAM HCL 2 MG/2ML IJ SOLN
INTRAMUSCULAR | Status: AC
  Filled 2019-04-10: qty 2

## 2019-04-10 MED ORDER — ACETAMINOPHEN 500 MG PO TABS: 1000.0000 mg | ORAL_TABLET | Freq: Three times a day (TID) | ORAL | 0 refills | Status: AC

## 2019-04-10 MED ORDER — EPHEDRINE 5 MG/ML INJ
INTRAVENOUS | Status: AC
  Filled 2019-04-10: qty 10

## 2019-04-10 MED ORDER — POLYETHYLENE GLYCOL 3350 17 G PO PACK: 17.0000 g | PACK | Freq: Every day | ORAL | Status: DC | PRN

## 2019-04-10 MED ORDER — HYDROMORPHONE HCL 1 MG/ML IJ SOLN: 0.5000 mg | INTRAMUSCULAR | Status: DC | PRN

## 2019-04-10 MED ORDER — ONDANSETRON HCL 4 MG PO TABS: 4.0000 mg | ORAL_TABLET | Freq: Four times a day (QID) | ORAL | Status: DC | PRN

## 2019-04-10 MED ORDER — SUGAMMADEX SODIUM 200 MG/2ML IV SOLN
INTRAVENOUS | Status: DC | PRN
  Administered 2019-04-10: 200 mg via INTRAVENOUS

## 2019-04-10 MED ORDER — DEXAMETHASONE SODIUM PHOSPHATE 4 MG/ML IJ SOLN
INTRAMUSCULAR | Status: DC | PRN
  Administered 2019-04-10: 8 mg via INTRAVENOUS

## 2019-04-10 MED ORDER — MIDAZOLAM HCL 2 MG/2ML IJ SOLN: 1.0000 mg | INTRAMUSCULAR | Status: DC | PRN

## 2019-04-10 MED ORDER — EPHEDRINE SULFATE 50 MG/ML IJ SOLN
INTRAMUSCULAR | Status: DC | PRN
  Administered 2019-04-10 (×4): 5 mg via INTRAVENOUS
  Administered 2019-04-10 (×3): 10 mg via INTRAVENOUS

## 2019-04-10 MED ORDER — BUPIVACAINE-EPINEPHRINE (PF) 0.5% -1:200000 IJ SOLN
INTRAMUSCULAR | Status: DC | PRN
  Administered 2019-04-10: 15 mL via PERINEURAL

## 2019-04-10 MED ORDER — OXYCODONE HCL 5 MG PO TABS: 5.0000 mg | ORAL_TABLET | Freq: Once | ORAL | Status: DC | PRN

## 2019-04-10 MED ORDER — OXYCODONE HCL 5 MG PO TABS: ORAL_TABLET | ORAL | 0 refills | Status: AC

## 2019-04-10 MED ORDER — LACTATED RINGERS IV SOLN: INTRAVENOUS | Status: DC

## 2019-04-10 MED ORDER — BUPIVACAINE LIPOSOME 1.3 % IJ SUSP
INTRAMUSCULAR | Status: DC | PRN
  Administered 2019-04-10: 10 mg via PERINEURAL

## 2019-04-10 MED ORDER — LORATADINE 10 MG PO TABS: 10.0000 mg | ORAL_TABLET | Freq: Every day | ORAL | Status: DC

## 2019-04-10 MED ORDER — VANCOMYCIN HCL 1000 MG IV SOLR
INTRAVENOUS | Status: AC
  Filled 2019-04-10: qty 1000

## 2019-04-10 MED ORDER — LIDOCAINE 2% (20 MG/ML) 5 ML SYRINGE
INTRAMUSCULAR | Status: AC
  Filled 2019-04-10: qty 5

## 2019-04-10 MED ORDER — OXYCODONE HCL 5 MG PO TABS: 5.0000 mg | ORAL_TABLET | ORAL | Status: DC | PRN

## 2019-04-10 MED ORDER — DOCUSATE SODIUM 100 MG PO CAPS
100.0000 mg | ORAL_CAPSULE | Freq: Two times a day (BID) | ORAL | Status: DC
  Administered 2019-04-10: 100 mg via ORAL
  Filled 2019-04-10: qty 1

## 2019-04-10 MED ORDER — CEFAZOLIN SODIUM-DEXTROSE 2-4 GM/100ML-% IV SOLN
2.0000 g | INTRAVENOUS | Status: AC
  Administered 2019-04-10: 10:00:00 2 g via INTRAVENOUS

## 2019-04-10 MED ORDER — CHLORHEXIDINE GLUCONATE 4 % EX LIQD: 60.0000 mL | Freq: Once | CUTANEOUS | Status: DC

## 2019-04-10 MED ORDER — VANCOMYCIN HCL 1000 MG IV SOLR
INTRAVENOUS | Status: DC | PRN
  Administered 2019-04-10: 1000 mg

## 2019-04-10 MED ORDER — CEFAZOLIN SODIUM-DEXTROSE 2-4 GM/100ML-% IV SOLN
2.0000 g | Freq: Four times a day (QID) | INTRAVENOUS | Status: DC
  Administered 2019-04-10 (×2): 2 g via INTRAVENOUS
  Filled 2019-04-10: qty 100

## 2019-04-10 SURGICAL SUPPLY — 117 items
APL PRP STRL LF DISP 70% ISPRP (MISCELLANEOUS) ×1
APL SKNCLS STERI-STRIP NONHPOA (GAUZE/BANDAGES/DRESSINGS) ×1
BENZOIN TINCTURE PRP APPL 2/3 (GAUZE/BANDAGES/DRESSINGS) ×3 IMPLANT
BIT DRILL 1.8 CANN MAX VPC (BIT) ×2 IMPLANT
BIT DRILL 2.5X2.75 QC CALB (BIT) ×2 IMPLANT
BIT DRILL 4.8X200 CANN (BIT) ×2 IMPLANT
BIT DRILL CALIBRATED 2.7 (BIT) ×1 IMPLANT
BIT DRILL CALIBRATED 2.7MM (BIT) ×1
BLADE HEX COATED 2.75 (ELECTRODE) ×3 IMPLANT
BLADE SURG 10 STRL SS (BLADE) ×3 IMPLANT
BLADE SURG 15 STRL LF DISP TIS (BLADE) ×1 IMPLANT
BLADE SURG 15 STRL SS (BLADE) ×3
BNDG CMPR 9X4 STRL LF SNTH (GAUZE/BANDAGES/DRESSINGS) ×1
BNDG ELASTIC 4X5.8 VLCR STR LF (GAUZE/BANDAGES/DRESSINGS) ×3 IMPLANT
BNDG ESMARK 4X9 LF (GAUZE/BANDAGES/DRESSINGS) ×2 IMPLANT
BONE CHIP PRESERV 5CC PCAN5 (Bone Implant) ×3 IMPLANT
CHLORAPREP W/TINT 26 (MISCELLANEOUS) ×3 IMPLANT
CLOSURE STERI-STRIP 1/2X4 (GAUZE/BANDAGES/DRESSINGS) ×1
CLSR STERI-STRIP ANTIMIC 1/2X4 (GAUZE/BANDAGES/DRESSINGS) ×2 IMPLANT
COVER WAND RF STERILE (DRAPES) IMPLANT
CUFF TOURN SGL QUICK 18X3 (MISCELLANEOUS) ×3 IMPLANT
CUFF TOURN SGL QUICK 24 (TOURNIQUET CUFF)
CUFF TRNQT CYL 24X4X16.5-23 (TOURNIQUET CUFF) IMPLANT
DECANTER SPIKE VIAL GLASS SM (MISCELLANEOUS) IMPLANT
DRAPE C-ARMOR (DRAPES) ×2 IMPLANT
DRAPE EXTREMITY T 121X128X90 (DISPOSABLE) ×3 IMPLANT
DRAPE INCISE IOBAN 66X45 STRL (DRAPES) ×3 IMPLANT
DRAPE OEC MINIVIEW 54X84 (DRAPES) IMPLANT
DRAPE STERI 35X30 U-POUCH (DRAPES) ×2 IMPLANT
DRAPE U-SHAPE 47X51 STRL (DRAPES) ×3 IMPLANT
DRIVER BIT HEX CANN 1.5 (ORTHOPEDIC DISPOSABLE SUPPLIES) ×2 IMPLANT
ELECT REM PT RETURN 9FT ADLT (ELECTROSURGICAL) ×3
ELECTRODE REM PT RTRN 9FT ADLT (ELECTROSURGICAL) ×1 IMPLANT
EXT HOSE W/PLC CONNECTION (MISCELLANEOUS) ×3
EXTENSION HOSE W/PLC CONNECTON (MISCELLANEOUS) IMPLANT
GAUZE SPONGE 4X4 12PLY STRL (GAUZE/BANDAGES/DRESSINGS) ×3 IMPLANT
GAUZE XEROFORM 1X8 LF (GAUZE/BANDAGES/DRESSINGS) ×3 IMPLANT
GLOVE BIO SURGEON STRL SZ 6.5 (GLOVE) ×2 IMPLANT
GLOVE BIO SURGEONS STRL SZ 6.5 (GLOVE) ×1
GLOVE BIOGEL PI IND STRL 6.5 (GLOVE) ×1 IMPLANT
GLOVE BIOGEL PI IND STRL 7.5 (GLOVE) IMPLANT
GLOVE BIOGEL PI IND STRL 8 (GLOVE) ×1 IMPLANT
GLOVE BIOGEL PI INDICATOR 6.5 (GLOVE) ×2
GLOVE BIOGEL PI INDICATOR 7.5 (GLOVE) ×2
GLOVE BIOGEL PI INDICATOR 8 (GLOVE) ×4
GLOVE ECLIPSE 8.0 STRL XLNG CF (GLOVE) ×3 IMPLANT
GLOVE SS BIOGEL STRL SZ 7 (GLOVE) IMPLANT
GLOVE SUPERSENSE BIOGEL SZ 7 (GLOVE) ×2
GOWN STRL REUS W/ TWL LRG LVL3 (GOWN DISPOSABLE) ×1 IMPLANT
GOWN STRL REUS W/TWL LRG LVL3 (GOWN DISPOSABLE) ×3
GOWN STRL REUS W/TWL XL LVL3 (GOWN DISPOSABLE) ×3 IMPLANT
GRAFT BNE CANC CHIPS 1-8 5CC (Bone Implant) IMPLANT
K-WIRE ACE 1.6X6 (WIRE) ×12
K-WIRE COCR 0.9X95 (WIRE) ×3
KWIRE ACE 1.6X6 (WIRE) IMPLANT
KWIRE COCR 0.9X95 (WIRE) IMPLANT
LOOP VESSEL MAXI BLUE (MISCELLANEOUS) ×2 IMPLANT
NS IRRIG 1000ML POUR BTL (IV SOLUTION) ×3 IMPLANT
PACK ARTHROSCOPY DSU (CUSTOM PROCEDURE TRAY) ×3 IMPLANT
PACK BASIN DAY SURGERY FS (CUSTOM PROCEDURE TRAY) ×3 IMPLANT
PAD CAST 4YDX4 CTTN HI CHSV (CAST SUPPLIES) ×1 IMPLANT
PADDING CAST COTTON 4X4 STRL (CAST SUPPLIES) ×3
PENCIL SMOKE EVACUATOR (MISCELLANEOUS) ×3 IMPLANT
PIN GUIDE DRILL TIP 2.8X300 (DRILL) ×2 IMPLANT
PLATE LOCK R LG 97X10.9X2.5X10 (Plate) IMPLANT
PLATE LOCK RT LRG (Plate) ×3 IMPLANT
PLATE LOCK RT SM (Plate) ×3 IMPLANT
PLATE LOCK RT SM 74X10.7X3.5X9 (Plate) IMPLANT
SCREW CANN FT 95X8 NS LNG (Screw) IMPLANT
SCREW CANNULATED 6.5X95 HIP (Screw) ×2 IMPLANT
SCREW CANNULATED 8.0X95 (Screw) ×3 IMPLANT
SCREW CORT 3.5X26 (Screw) ×3 IMPLANT
SCREW CORT T15 24X3.5XST LCK (Screw) IMPLANT
SCREW CORT T15 26X3.5XST LCK (Screw) IMPLANT
SCREW CORTICAL 3.5MM  44MM (Screw) ×2 IMPLANT
SCREW CORTICAL 3.5MM 44MM (Screw) IMPLANT
SCREW CORTICAL 3.5X24MM (Screw) ×3 IMPLANT
SCREW CORTICAL LOW PROF 3.5X20 (Screw) ×2 IMPLANT
SCREW LOCK 3.5X48 DIST TIB (Screw) ×2 IMPLANT
SCREW LOCK 3.5X54 DIST TIB (Screw) ×2 IMPLANT
SCREW LOCK CORT STAR 3.5X12 (Screw) ×2 IMPLANT
SCREW LOCK CORT STAR 3.5X16 (Screw) ×2 IMPLANT
SCREW LOCK CORT STAR 3.5X18 (Screw) ×2 IMPLANT
SCREW LOCK CORT STAR 3.5X20 (Screw) ×2 IMPLANT
SCREW LOCK CORT STAR 3.5X36 (Screw) ×2 IMPLANT
SCREW MAX VPC  2.5X20 (Screw) ×2 IMPLANT
SCREW MAX VPC 2.5X20 (Screw) IMPLANT
SCREW NON LOCKING LP 3.5 16MM (Screw) ×2 IMPLANT
SHEET MEDIUM DRAPE 40X70 STRL (DRAPES) ×3 IMPLANT
SLEEVE SCD COMPRESS KNEE MED (MISCELLANEOUS) ×2 IMPLANT
SLING ARM FOAM STRAP LRG (SOFTGOODS) ×3 IMPLANT
SLING ARM FOAM STRAP MED (SOFTGOODS) ×2 IMPLANT
SPLINT FAST PLASTER 5X30 (CAST SUPPLIES) ×20
SPLINT PLASTER CAST FAST 5X30 (CAST SUPPLIES) ×10 IMPLANT
SPONGE LAP 18X18 RF (DISPOSABLE) ×3 IMPLANT
STAPLER VISISTAT 35W (STAPLE) IMPLANT
SUCTION FRAZIER HANDLE 10FR (MISCELLANEOUS) ×2
SUCTION TUBE FRAZIER 10FR DISP (MISCELLANEOUS) IMPLANT
SUT MNCRL AB 4-0 PS2 18 (SUTURE) ×2 IMPLANT
SUT VIC AB 0 CT1 27 (SUTURE) ×6
SUT VIC AB 0 CT1 27XBRD ANBCTR (SUTURE) ×1 IMPLANT
SUT VIC AB 1 CT1 27 (SUTURE)
SUT VIC AB 1 CT1 27XBRD ANBCTR (SUTURE) IMPLANT
SUT VIC AB 2-0 CT1 27 (SUTURE) ×3
SUT VIC AB 2-0 CT1 TAPERPNT 27 (SUTURE) IMPLANT
SUT VIC AB 2-0 SH 27 (SUTURE) ×3
SUT VIC AB 2-0 SH 27XBRD (SUTURE) ×1 IMPLANT
SUT VIC AB 3-0 SH 27 (SUTURE)
SUT VIC AB 3-0 SH 27X BRD (SUTURE) IMPLANT
TOWEL GREEN STERILE FF (TOWEL DISPOSABLE) ×3 IMPLANT
TUBE CONNECTING 20'X1/4 (TUBING) ×1
TUBE CONNECTING 20X1/4 (TUBING) ×2 IMPLANT
WASHER 3.5MM (Orthopedic Implant) ×4 IMPLANT
WASHER 8.0 (Orthopedic Implant) ×2 IMPLANT
WASHER CANN FLAT 8 (Orthopedic Implant) IMPLANT
WASHER FLAT 6.5MM (Washer) ×2 IMPLANT
YANKAUER SUCT BULB TIP NO VENT (SUCTIONS) ×3 IMPLANT

## 2019-04-10 NOTE — Anesthesia Postprocedure Evaluation (Signed)
Anesthesia Post Note  Patient: Jessica [name]  Procedure(s) Performed: RIGHT OPEN REDUCTION INTERNAL FIXATION (ORIF) DISTAL HUMERUS FRACTURE WITH EXTENSION (Right Elbow)     Patient location during evaluation: PACU Anesthesia Type: General Level of consciousness: awake and alert and oriented Pain management: pain level controlled Vital Signs Assessment: post-procedure vital signs reviewed and stable Respiratory status: spontaneous breathing, nonlabored ventilation and respiratory function stable Cardiovascular status: blood pressure returned to baseline Postop Assessment: no apparent nausea or vomiting Anesthetic complications: no    Last Vitals:  Vitals:   04/10/19 1345 04/10/19 1400  BP: 109/70 (!) 100/59  Pulse: 86 78  Resp: (!) 41 16  Temp: 36.4 C   SpO2: 96% 98%    Last Pain:  Vitals:   04/10/19 1400  TempSrc:   PainSc: [name]

## 2019-04-10 NOTE — Anesthesia Procedure Notes (Signed)
Procedure Name: Intubation Date/Time: 04/10/2019 10:34 AM Performed by: Brennan Bailey, MD Pre-anesthesia Checklist: Patient identified, Emergency Drugs available, Suction available and Patient being monitored Patient Re-evaluated:Patient Re-evaluated prior to induction Oxygen Delivery Method: Circle System Utilized Preoxygenation: Pre-oxygenation with 100% oxygen Induction Type: IV induction Ventilation: Mask ventilation without difficulty Laryngoscope Size: Glidescope and 3 Grade View: Grade IV Tube type: Oral Tube size: 7.0 mm Number of attempts: 3 Airway Equipment and Method: Stylet and Oral airway Placement Confirmation: ETT inserted through vocal cords under direct vision,  positive ETCO2 and breath sounds checked- equal and bilateral Secured at: 22 cm Tube secured with: Tape Dental Injury: Teeth and Oropharynx as per pre-operative assessment  Difficulty Due To: Difficulty was unanticipated and Difficult Airway- due to anterior larynx Future Recommendations: Recommend- induction with short-acting agent, and alternative techniques readily available Comments: First 2 attempts with direct laryngoscopy (Mac 3 then Miller 3 blade) unable to view glottis due to anterior airway and stiff neck mobility. Atraumatic intubation with Glidescope #3 blade. Mask ventilated between attempts with SpO2 high 90s throughout. Recommend Glidescope with future intubations. Daiva Huge, MD

## 2019-04-10 NOTE — Progress Notes (Signed)
Assisted Dr. Daiva Huge with right, ultrasound guided, supraclavicular block. Side rails up, monitors on throughout procedure. See vital signs in flow sheet. Tolerated Procedure well.

## 2019-04-10 NOTE — Interval H&P Note (Signed)
History and Physical Interval Note:  04/10/2019 10:10 AM  Jessica [name]  has presented today for surgery, with the diagnosis of DISPLACE SIMPLE SUPRACONDYLAR FRACTURE WITHOUT INTERCAONDYLAR FRACTUE UNSPEIFIED HUMERUS.  The various methods of treatment have been discussed with the patient and family. After consideration of risks, benefits and other options for treatment, the patient has consented to  Procedure(s): RIGHT OPEN REDUCTION INTERNAL FIXATION (ORIF) DISTAL HUMERUS FRACTURE WITH EXTENSION (Right) as a surgical intervention.  The patient's history has been reviewed, patient examined, no change in status, stable for surgery.  I have reviewed the patient's chart and labs.  Questions were answered to the patient's satisfaction.     Hiram Gash

## 2019-04-10 NOTE — Transfer of Care (Signed)
Immediate Anesthesia Transfer of Care Note  Patient: Jessica [name]  Procedure(s) Performed: RIGHT OPEN REDUCTION INTERNAL FIXATION (ORIF) DISTAL HUMERUS FRACTURE WITH EXTENSION (Right Elbow)  Patient Location: PACU  Anesthesia Type:GA combined with regional for post-op pain  Level of Consciousness: awake and patient cooperative  Airway & Oxygen Therapy: Patient Spontanous Breathing and Patient connected to face mask oxygen  Post-op Assessment: Report given to RN and Post -op Vital signs reviewed and stable  Post vital signs: Reviewed and stable  Last Vitals:  Vitals Value Taken Time  BP    Temp    Pulse 86 04/10/19 1345  Resp 41 04/10/19 1345  SpO2 96 % 04/10/19 1345  Vitals shown include unvalidated device data.  Last Pain:  Vitals:   04/10/19 0835  TempSrc: Tympanic  PainSc: 0-No pain         Complications: No apparent anesthesia complications

## 2019-04-10 NOTE — Anesthesia Procedure Notes (Addendum)
Anesthesia Regional Block: Supraclavicular block   Pre-Anesthetic Checklist: ,, timeout performed, Correct Patient, Correct Site, Correct Laterality, Correct Procedure, Correct Position, site marked, Risks and benefits discussed, pre-op evaluation,  At surgeon's request and post-op pain management  Laterality: Right  Prep: Maximum Sterile Barrier Precautions used, chloraprep       Needles:  Injection technique: Single-shot  Needle Type: Echogenic Stimulator Needle     Needle Length: 9cm  Needle Gauge: 22     Additional Needles:   Procedures:,,,, ultrasound used (permanent image in chart),,,,  Narrative:  Start time: 04/10/2019 9:47 AM End time: 04/10/2019 9:49 AM Injection made incrementally with aspirations every 5 mL.  Performed by: Personally  Anesthesiologist: Brennan Bailey, MD  Additional Notes: Risks, benefits, and alternative discussed. Patient gave consent for procedure. Patient prepped and draped in sterile fashion. Sedation administered, patient remains easily responsive to voice. Relevant anatomy identified with ultrasound guidance. Local anesthetic given in 5cc increments with no signs or symptoms of intravascular injection. No pain or paraesthesias with injection. Patient monitored throughout procedure with signs of LAST or immediate complications. Tolerated well. Ultrasound image placed in chart.  Tawny Asal, MD

## 2019-04-10 NOTE — Op Note (Signed)
Orthopaedic Surgery Operative Note (CSN: [id number])  Jessica [name]  [birth year] Date of Surgery: 04/10/2019   Diagnoses:  Right supracondylar intercondylar distal distraction  Procedure: Right olecranon osteotomy Right ulnar nerve neurolysis Right distal humerus intercondylar/supracondylar open reduction internal fixation   Operative Finding Successful completion of the planned procedure.  Patient's lateral column bone including that time was rather distally fractured and there was significant bone loss with compression at the fracture site from motion.  We were able to achieve interfragmentary screw to stabilize the articular surface and it was visually inspected the outside the joint there was in the subchondral bone on her x-ray.  Our fixation was moderate to tenuous overall is poor quality nasal fracture.  There was one fragment of bone anterior that was nonarticular and involving the superior aspect of the coronoid fossa and we felt that fixation of this was likely not to benefit the patient.  Post-operative plan: The patient will be nonweightbearing in a splint for 2 weeks and range of motion to start after that with removable splint use.  The patient will be discharged home tomorrow after an observation stay tonight.  DVT prophylaxis not indicated in this ambulatory upper extremity patient without significant risk factors.   Pain control with PRN pain medication preferring oral medicines.  Follow up plan will be scheduled in approximately 7 days for incision check and XR.  Post-Op Diagnosis: Same Surgeons:Primary: Hiram Gash, MD Assistants:Caroline McBane PA-C Location: [place] OR ROOM 5 Anesthesia: General with regional anesthesia Antibiotics: Ancef 2 g with local vancomycin powder 1 g at the surgical site Tourniquet time:  Total Tourniquet Time Documented: Upper Arm (Right) - 120 minutes Total: Upper Arm (Right) - 120 minutes  Estimated Blood Loss: Minimal Complications:  None Specimens: None Implants: Implant Name Type Inv. Item Serial No. Manufacturer Lot No. LRB No. Used Action  BONE CHIP PRESERV 5CC - [id number] Bone Implant BONE CHIP PRESERV 5CC [id number] [employer] [facility] [facility] [id number] Right 1 Implanted  9 HOLE RIGHT POSTERIOR LATERAL PLATE Plate    [id number] Right 1 Implanted  10 HOLE RIGHT MEDIAL PLATE Plate    [id number] Right 1 Implanted  SCREW CORTICAL 3.5MM  44MM - [id number] Screw SCREW CORTICAL 3.5MM  44MM  ZIMMER RECON(ORTH,TRAU,BIO,SG) [id number] Right 1 Implanted  WASHER 3.5MM - [id number] Orthopedic Implant WASHER 3.5MM  ZIMMER RECON(ORTH,TRAU,BIO,SG) [id number] Right 2 Implanted  SCREW NON LOCKING LP 3.5 16MM - [id number] Screw SCREW NON LOCKING LP 3.5 16MM  ZIMMER RECON(ORTH,TRAU,BIO,SG) [id number] Right 1 Implanted  SCREW CORTICAL LOW PROF 3.5X20 - [id number] Screw SCREW CORTICAL LOW PROF 3.5X20  ZIMMER RECON(ORTH,TRAU,BIO,SG) [id number] Right 1 Implanted  SCREW CORT 3.5X26 - [id number] Screw SCREW CORT 3.5X26  ZIMMER RECON(ORTH,TRAU,BIO,SG) [id number] Right 1 Implanted  SCREW LOCK CORT STAR 3.5X12 - [id number] Screw SCREW LOCK CORT STAR 3.5X12  ZIMMER RECON(ORTH,TRAU,BIO,SG) [id number] Right 1 Implanted  SCREW LOCK CORT STAR 3.5X16 - [id number] Screw SCREW LOCK CORT STAR 3.5X16  ZIMMER RECON(ORTH,TRAU,BIO,SG) [id number] Right 1 Implanted  SCREW LOCK CORT STAR 3.5X20 - [id number] Screw SCREW LOCK CORT STAR 3.5X20  ZIMMER RECON(ORTH,TRAU,BIO,SG) [id number] Right 1 Implanted  SCREW LOCK 3.5X48 DIST TIB - [id number] Screw SCREW LOCK 3.5X48 DIST TIB  ZIMMER RECON(ORTH,TRAU,BIO,SG) [id number] Right 1 Implanted  3.5MM MD SCREW 48MM Screw    [id number] Right 1 Implanted  SCREW CORTICAL 3.5X24MM - [id number] Screw SCREW CORTICAL 3.5X24MM  ZIMMER RECON(ORTH,TRAU,BIO,SG) [id number] Right 1 Implanted  SCREW LOCK CORT STAR 3.5X18 - [id number] Screw SCREW LOCK CORT STAR 3.5X18  ZIMMER RECON(ORTH,TRAU,BIO,SG) [id number] Right  1 Implanted  SCREW LOCK CORT STAR  3.5X36 - [id number] Screw SCREW LOCK CORT STAR 3.5X36  ZIMMER RECON(ORTH,TRAU,BIO,SG) [id number] Right 1 Implanted  SCREW MAX VPC  2.5X20 - [id number] Screw SCREW MAX VPC  2.5X20  ZIMMER RECON(ORTH,TRAU,BIO,SG) [id number] Right 1 Implanted  WASHER FLAT 6.5MM - [id number] Washer WASHER FLAT 6.5MM  ZIMMER RECON(ORTH,TRAU,BIO,SG) [id number] Right 1 Implanted  SCREW CANNULATED 8.0X95 - [id number] Screw SCREW CANNULATED 8.0X95  ZIMMER RECON(ORTH,TRAU,BIO,SG) [id number] Right 1 Implanted  WASHER 8.0 - [id number] Orthopedic Implant WASHER 8.0  ZIMMER RECON(ORTH,TRAU,BIO,SG) [id number] Right 1 Implanted    Indications for Surgery:   Jessica [name] is a 79 y.o. female with Fall resulting in a complex supracondylar intercondylar distal humerus fracture.  Benefits and risks of operative and nonoperative management were discussed prior to surgery with patient/guardian(s) and informed consent form was completed.  We did consider the option of a total elbow plasty however the patient's age and function meant that the limitations associated with this would likely have been difficult for the patient to follow.  Specific risks including infection, need for additional surgery, nonunion, malunion, nerve base symptoms, neurovascular damage, hardware complications.   Procedure:   The patient was identified properly. Informed consent was obtained and the surgical site was marked. The patient was taken up to suite where general anesthesia was induced.  The patient was positioned lateral on a beanbag with the arm over a radiolucent sure foot positioner.  The right elbow was prepped and draped in the usual sterile fashion.  Timeout was performed before the beginning of the case.  Tourniquet was used for the above duration.  We began with a longitudinal approach of the posterior elbow centered over the subcutaneous border.  Went through skin sharply achieving hemostasis we progressed.  We identified and made full-thickness skin  flaps going laterally and then medially carefully as we approached the ulnar nerve.  We found and protected the ulnar nerve and were able to neurolysed it from its location in the cubital tunnel and transpose it temporarily anterior to active throughout case this was released deep to the FCU fascia typical fashion to preserve the FCU muscular branch.  At that point were able to identify the distal aspect the olecranon.  Under fluoroscopic guidance we are able to make a chevron osteotomy after initially passing a 7.3 mm Biomet 95 mm screw to sound its canal and prepare for eventual fixation of the olecranon osteotomy.  Once this was complete we were able to get you open medial and lateral para tricipital windows and reflect the triceps to the proximal to the incision.  We then had good visualization of the medial lateral columns in the distal humerus.  There was a complex intra-articular fracture with the medial fragment being rather large but separate from the medial epicondyle.  There is additionally an intercalary fragment posterior on the trochlea that we took out and placed on the back table for safekeeping.  We then noted that there was significant hematoma and we cleared all of this.  There was cancellous bone loss in the lateral column at the capitellar fracture consistent with motion and we felt that bone grafting was likely necessary.  We took account of her fractures and simplified the fracture pattern using series of K wires.  We were able to use clamps and K wires to get anatomic reduction of the joint surface and once we had this appropriately held we placed a medial to lateral 3.5 mm interfragmentary screw to hold the elbow  spool in the appropriate position.  We did not use the intercalary fragment until the end of the case.  Once we have simplified the joint itself we then used direct indirect reduction maneuvers to align the columns and overall align the appropriate distal humerus  anatomy.  We did check the joint carefully to make sure there was no perforation of the screws as one of the fluoroscopy shots did make it appear that the interfragmentary screw may have gone spinal bone however there is no perforation of the cartilage whatsoever.    Once we had a reasonable overall reduction held with provisional K wires we placed our posterior lateral and direct medial plates.  We used a series of locking and nonlocking screws proximal to the fracture and locking screws in the preset in variable angle formation is to avoid our other hardware and obtain purchase of the medial lateral columns.  We are happy with her final reduction of the articular surface with these plates.  We then placed bone graft after fully irrigating the incision at the area where there was loss of bone we were able to use a headless compression screw from Biomet to provide fixation of the intercalary trochlear fragment.  We had a near anatomic reduction of the joint and there was no perforation of hardware within the joint.  The patient may have some hardware prominence medial and lateral to the joint however we needed to have plate position as it was to obtain purchase in this relatively distal fracture.  Once our hardware was all in we then anatomically reduced the olecranon fragment and placed a 7.3 screw with a washer achieving good purchase.  Final fluoroscopic images demonstrated anatomic reduction of the joint and appropriate overall alignment of the elbow.  Irrigated again and closed the dead space medial lateral avoiding the ulnar nerve.  The ulnar nerve was placed back into its normal cubital tunnel bed. Clearly this was interacting with the plate but it was intact and we felt that it would be more appropriate to leave it in its normal bed as this patient didn't have symptoms in her ulnar distribution preop.    We irrigated the wound copiously before placing local antibiotic as listed above.  Close the  incision in a multilayer fashion with absorbable suture.  Sterile dressing was placed.  Well molded and padded long arm splint was placed.  Patient was awoken taken to PACU in stable condition.  Noemi Chapel, PA-C, present and scrubbed throughout the case, critical for completion in a timely fashion, and for retraction, instrumentation, closure.

## 2019-04-10 NOTE — Anesthesia Preprocedure Evaluation (Addendum)
Anesthesia Evaluation  Patient identified by MRN, date of birth, ID band Patient awake    Reviewed: Allergy & Precautions, NPO status , Patient's Chart, lab work & pertinent test results  History of Anesthesia Complications Negative for: history of anesthetic complications  Airway Mallampati: II  TM Distance: >3 FB Neck ROM: Full    Dental  (+) Chipped,    Pulmonary neg pulmonary ROS,    Pulmonary exam normal        Cardiovascular negative cardio ROS Normal cardiovascular exam     Neuro/Psych negative neurological ROS  negative psych ROS   GI/Hepatic negative GI ROS, Neg liver ROS,   Endo/Other  Hypothyroidism   Renal/GU Renal InsufficiencyRenal disease  negative genitourinary   Musculoskeletal negative musculoskeletal ROS (+)   Abdominal   Peds  Hematology negative hematology ROS (+)   Anesthesia Other Findings Day of surgery medications reviewed with patient.  Reproductive/Obstetrics negative OB ROS                            Anesthesia Physical Anesthesia Plan  ASA: II  Anesthesia Plan: General   Post-op Pain Management: GA combined w/ Regional for post-op pain   Induction:   PONV Risk Score and Plan: 3 and Treatment may vary due to age or medical condition, Ondansetron and Dexamethasone  Airway Management Planned: Oral ETT  Additional Equipment: None  Intra-op Plan:   Post-operative Plan: Extubation in OR  Informed Consent: I have reviewed the patients History and Physical, chart, labs and discussed the procedure including the risks, benefits and alternatives for the proposed anesthesia with the patient or authorized representative who has indicated his/her understanding and acceptance.     Dental advisory given  Plan Discussed with: CRNA  Anesthesia Plan Comments:        Anesthesia Quick Evaluation

## 2019-04-11 NOTE — Discharge Summary (Signed)
Patient ID: OTIS TERNES MRN: [id number] DOB/AGE: May 31, 1940 79 y.o.  Admit date: 04/10/2019 Discharge date: 04/11/2019  Admission Diagnoses: Right supracondylar intercondylar distal distraction  Discharge Diagnoses:  Active Problems:   Closed fracture of right distal humerus   Past Medical History:  Diagnosis Date  . Anemia 12/2015  . Bronchitis, chronic ([place])    in her 47s  . Cataract   . Chronic kidney disease    in 23s had nephritis  . Closed fracture of right distal humerus    right  . Hearing loss 05/03/2016  . High cholesterol   . Hypothyroidism   . Osteopenia   . Pericarditis 01/1999  . Pneumothorax 01/1999  . Thyroid disorder   . Vitamin B 12 deficiency    Procedures Performed: Right distal humerus intercondylar/supracondylar open reduction internal fixation, Right olecranon osteotomy, Right ulnar nerve neurolysis  Discharged Condition: stable/good  Hospital Course: Patient brought in as an outpatient for surgery.  She tolerated procedure well.  She was kept for monitoring overnight for pain control and medical monitoring postop and was found to be stable for DC home the morning after surgery.  Patient was instructed on specific activity restrictions and all questions were answered.  Consults: None  Significant Diagnostic Studies: No additional pertinent studies  Treatments: Surgery  Discharge Exam: Right upper extremity: Splint CDI. Skin intact though cannot assess fully beneath splint. Nontender to palpation proximally. Distal motor and sensory altered in setting of block. She was able to wiggle her fingers. She endorsed sensation distally. Well perfused digits.   Disposition: Discharge disposition: 01-Home or Self Care      Follow-up: 1 week for incision check and x-ray Post-op medications: Sent electronically to patient's local pharmacy Weight-bearing/restrictions: Nonweightbearing in a splint for 2 weeks and range of motion to start after that  with removable splint use  Discharge Instructions    Call MD for:  redness, tenderness, or signs of infection (pain, swelling, redness, odor or green/yellow discharge around incision site)   Complete by: As directed    Call MD for:  severe uncontrolled pain   Complete by: As directed    Call MD for:  temperature >100.4   Complete by: As directed    Diet - low sodium heart healthy   Complete by: As directed    Discharge instructions   Complete by: As directed    Ophelia Charter MD, MPH Noemi Chapel, PA-C [facility] [address] [phone] (tel)   [phone] (fax)   POST-OPERATIVE INSTRUCTIONS   WOUND CARE - Please keep splint clean dry and intact until followup.  - You may shower on Post-Op Day #2.  - You must keep splint dry during this process and may find that a plastic bag taped around the extremity or alternatively a towel based bath may be a better option.  If you get your splint wet or if it is damaged please contact our clinic.  EXERCISES - Due to your splint being in place you will not be able to bear weight through your extremity.    - You may use a sling for comfort - Please continue to work on range of motion of your fingers and stretch these multiple times a day to prevent stiffness. - Please continue to ambulate and do not stay sitting or lying for too long. Perform foot and wrist pumps to assist in circulation.  FOLLOW-UP If you develop a Fever (>101.5), Redness or Drainage from the surgical incision site,  please call our office to arrange for an evaluation. Please call the office to schedule a follow-up appointment for your incision check if you do not already have one, 7-10 days post-operatively.  REGIONAL ANESTHESIA (NERVE BLOCKS) The anesthesia team may have performed a nerve block for you if safe in the setting of your care.  This is a great tool used to minimize pain.  Typically the block may start wearing off overnight  but the long acting medicine may last for 3-4 days.  The nerve block wearing off can be a challenging period but please utilize your as needed pain medications to try and manage this period.    POST-OP MEDICATIONS- Multimodal approach to pain control . In general your pain will be controlled with a combination of substances.  Prescriptions unless otherwise discussed are electronically sent to your pharmacy.  This is a carefully made plan we use to minimize narcotic use.     - Meloxicam OR Celebrex - Anti-inflammatory medication taken on a scheduled basis  - Acetaminophen - Non-narcotic pain medicine taken on a scheduled basis   - Oxycodone - This is a strong narcotic, to be used only on an "as needed" basis for pain.             -           Zofran - take as needed for nausea   HELPFUL INFORMATION  . If you had a block, it will wear off between 8-24 hrs postop typically.  This is period when your pain may go from nearly zero to the pain you would have had postop without the block.  This is an abrupt transition but nothing dangerous is happening.  You may take an extra dose of narcotic when this happens.  . You may be more comfortable sleeping in a semi-seated position the first few nights following surgery.  Keep a pillow propped under the elbow and forearm for comfort.  If you have a recliner type of chair it might be beneficial.  If not that is fine too, but it would be helpful to sleep propped up with pillows behind your operated shoulder as well under your elbow and forearm.  This will reduce pulling on the suture lines.  . When dressing, put your operative arm in the sleeve first.  When getting undressed, take your operative arm out last.  Loose fitting, button-down shirts are recommended.  Often in the first days after surgery you may be more comfortable keeping your operative arm under your shirt and not through the sleeve.  . You may return to work/school in the next couple of days when you  feel up to it.  Desk work and typing in the sling is fine.  . We suggest you use the pain medication the first night prior to going to bed, in order to ease any pain when the anesthesia wears off. You should avoid taking pain medications on an empty stomach as it will make you nauseous.  . You should wean off your narcotic medicines as soon as you are able.  Most patients will be off or using minimal narcotics before their first postop appointment.   . Do not drink alcoholic beverages or take illicit drugs when taking pain medications.  . It is against the law to drive while taking narcotics.  In some states it is against the law to drive while your arm is in a sling.   . Pain medication may make you constipated.  Below are  a few solutions to try in this order:   - Decrease the amount of pain medication if you aren't having pain.   - Drink lots of decaffeinated fluids.   - Drink prune juice and/or each dried prunes  . If the first 3 don't work start with additional solutions   - Take Colace - an over-the-counter stool softener   - Take Senokot - an over-the-counter laxative   - Take Miralax - a stronger over-the-counter laxative     Allergies as of 04/11/2019      Reactions   Atorvastatin    Muscle weakness   Rosuvastatin    Muscle weaknes      Medication List    TAKE these medications   acetaminophen 500 MG tablet Commonly known as: TYLENOL Take 2 tablets (1,000 mg total) by mouth every 8 (eight) hours for 14 days.   alendronate 70 MG tablet Commonly known as: FOSAMAX Take 1 tablet (70 mg total) by mouth every 7 (seven) days. Take with a full glass of water on an empty stomach.   calcium carbonate 1250 (500 Ca) MG chewable tablet Commonly known as: OS-CAL Chew 1 tablet (1,250 mg total) by mouth daily. What changed: additional instructions   celecoxib 100 MG capsule Commonly known as: CeleBREX Take 1 capsule (100 mg total) by mouth 2 (two) times daily.   cholecalciferol  1000 units tablet Commonly known as: VITAMIN D Take 2,000 Units by mouth daily.   clobetasol ointment 0.05 % Commonly known as: TEMOVATE Apply 1 application topically every other day.   FERROUS SULFATE PO Take 1 tablet by mouth daily.   l-methylfolate-B6-B12 3-35-2 MG Tabs tablet Commonly known as: METANX Take 1 tablet by mouth daily.   levothyroxine 125 MCG tablet Commonly known as: SYNTHROID Take 1 tablet (125 mcg total) by mouth daily.   loratadine 10 MG tablet Commonly known as: CLARITIN Take 1 tablet (10 mg total) by mouth daily.   multivitamin with minerals tablet Take 1 tablet by mouth daily.   ondansetron 4 MG tablet Commonly known as: Zofran Take 1 tablet (4 mg total) by mouth every 8 (eight) hours as needed for up to 7 days for nausea or vomiting.   oxyCODONE 5 MG immediate release tablet Commonly known as: Oxy IR/ROXICODONE Take 1-2 pills every 6 hrs as needed for pain, no more than 6 per day   Red Yeast Rice 600 MG Tabs Take 1 tablet by mouth daily.   vitamin C 500 MG tablet Commonly known as: ASCORBIC ACID Take 500 mg by mouth daily.

## 2019-04-16 ENCOUNTER — Encounter: Payer: Self-pay | Admitting: *Deleted

## 2019-05-05 ENCOUNTER — Other Ambulatory Visit: Payer: Self-pay | Admitting: Internal Medicine

## 2019-05-06 ENCOUNTER — Other Ambulatory Visit: Payer: Self-pay

## 2019-05-06 MED ORDER — LEVOTHYROXINE SODIUM 125 MCG PO TABS: 125.0000 ug | ORAL_TABLET | Freq: Every day | ORAL | 3 refills | Status: DC

## 2019-05-29 ENCOUNTER — Other Ambulatory Visit: Payer: Self-pay

## 2019-05-29 ENCOUNTER — Encounter: Payer: Self-pay | Admitting: Internal Medicine

## 2019-05-29 ENCOUNTER — Non-Acute Institutional Stay: Payer: Medicare Other | Admitting: Internal Medicine

## 2019-05-29 VITALS — BP 124/76 | HR 60 | Temp 97.5°F | Ht 62.5 in | Wt 157.6 lb

## 2019-05-29 DIAGNOSIS — K429 Umbilical hernia without obstruction or gangrene: Secondary | ICD-10-CM | POA: Diagnosis not present

## 2019-05-29 DIAGNOSIS — Z9889 Other specified postprocedural states: Secondary | ICD-10-CM

## 2019-05-29 DIAGNOSIS — Z8781 Personal history of (healed) traumatic fracture: Secondary | ICD-10-CM | POA: Diagnosis not present

## 2019-05-29 NOTE — Progress Notes (Signed)
Location: [place] of Service:  Clinic (12)  Provider:   Code Status:  Goals of Care:  Advanced Directives 04/10/2019  Does Patient Have a Medical Advance Directive? Yes  Type of Advance Directive Living will  Does patient want to make changes to medical advance directive? No - Patient declined  Copy of [place] in Chart? -  Pre-existing out of facility DNR order (yellow form or pink MOST form) -     Chief Complaint  Patient presents with  . Acute Visit    Umbilical hernia pain that has subsided. She has had a fall back in January and broke her right arm.     HPI: Patient is a 79 y.o. female seen today for an acute visit for Umbilical Hernia Patient has a history of osteoporosis, hyperlipidemia and vaginal lichen Recent Right ORIF for Distal Humerus Fracture  Patient does have a history of a small Umbilical  hernia.  When she came back from her surgery she noticed one night that it was little bit more bigger and she had pain she states 6 out of 10 no nausea vomiting diarrhea or abdominal distention.  She read on Internet and try to reduce her hernia and was successful doing it.  This has not happened to patient before. Since then she has not had  any more episodes and wanted to know if she needs to be referred to surgery. She has recovered from her ORIF and id doing really well after Therapy  Past Medical History:  Diagnosis Date  . Anemia 12/2015  . Bronchitis, chronic ([place])    in her 66s  . Cataract   . Chronic kidney disease    in 22s had nephritis  . Closed fracture of right distal humerus    right  . Hearing loss 05/03/2016  . High cholesterol   . Hypothyroidism   . Osteopenia   . Pericarditis 01/1999  . Pneumothorax 01/1999  . Thyroid disorder   . Vitamin B 12 deficiency     Past Surgical History:  Procedure Laterality Date  . birthmark      removed,left leg  . CATARACT EXTRACTION  2017  . ORIF HUMERUS FRACTURE Right  04/10/2019   Procedure: RIGHT OPEN REDUCTION INTERNAL FIXATION (ORIF) DISTAL HUMERUS FRACTURE WITH EXTENSION;  Surgeon: Hiram Gash, MD;  Location: [facility];  Service: Orthopedics;  Laterality: Right;  . [place]?    Allergies  Allergen Reactions  . Atorvastatin     Muscle weakness  . Rosuvastatin     Muscle weaknes    Outpatient Encounter Medications as of 05/29/2019  Medication Sig  . alendronate (FOSAMAX) 70 MG tablet Take 1 tablet (70 mg total) by mouth every 7 (seven) days. Take with a full glass of water on an empty stomach.  . calcium carbonate (OS-CAL) 1250 (500 Ca) MG chewable tablet Chew 1 tablet (1,250 mg total) by mouth daily. (Patient taking differently: Chew 1 tablet by mouth daily. Not a chewable)  . celecoxib (CELEBREX) 100 MG capsule Take 100 mg by mouth as needed.  . cholecalciferol (VITAMIN D) 1000 UNITS tablet Take 2,000 Units by mouth daily.   . clobetasol ointment (TEMOVATE) [id number] % Apply 1 application topically daily.   Marland Kitchen FERROUS SULFATE PO Take 1 tablet by mouth daily.  Marland Kitchen l-methylfolate-B6-B12 (METANX) 3-35-2 MG TABS tablet Take 1 tablet by mouth daily.  Marland Kitchen levothyroxine (SYNTHROID) 125 MCG tablet TAKE 1 TABLET BY  MOUTH  DAILY  . levothyroxine (SYNTHROID) 125 MCG tablet Take 1 tablet (125 mcg total) by mouth daily.  Marland Kitchen loratadine (CLARITIN) 10 MG tablet Take 1 tablet (10 mg total) by mouth daily.  . Multiple Vitamins-Minerals (MULTIVITAMIN WITH MINERALS) tablet Take 1 tablet by mouth daily.  . Red Yeast Rice 600 MG TABS Take 1 tablet by mouth daily.   . vitamin C (ASCORBIC ACID) 500 MG tablet Take 500 mg by mouth daily.   No facility-administered encounter medications on file as of 05/29/2019.    Review of Systems:  Review of Systems  Review of Systems  Constitutional: Negative for activity change, appetite change, chills, diaphoresis, fatigue and fever.  HENT: Negative for mouth sores, postnasal drip, rhinorrhea,  sinus pain and sore throat.   Respiratory: Negative for apnea, cough, chest tightness, shortness of breath and wheezing.   Cardiovascular: Negative for chest pain, palpitations and leg swelling.  Gastrointestinal: Negative for abdominal distention, abdominal pain, constipation, diarrhea, nausea and vomiting.  Genitourinary: Negative for dysuria and frequency.  Musculoskeletal: Negative for arthralgias, joint swelling and myalgias.  Skin: Negative for rash.  Neurological: Negative for dizziness, syncope, weakness, light-headedness and numbness.  Psychiatric/Behavioral: Negative for behavioral problems, confusion and sleep disturbance.     Health Maintenance  Topic Date Due  . COLONOSCOPY  07/11/2020  . TETANUS/TDAP  05/30/2028  . INFLUENZA VACCINE  Completed  . DEXA SCAN  Completed  . PNA vac Low Risk Adult  Completed    Physical Exam: Vitals:   05/29/19 1512  BP: 124/76  Pulse: 60  Temp: (!) 97.5 F (36.4 C)  SpO2: 93%  Weight: 157 lb 9.6 oz (71.5 kg)  Height: 5' 2.5" (1.588 m)   Body mass index is 28.37 kg/m. Physical Exam  Constitutional: Oriented to person, place, and time. Well-developed and well-nourished.  HENT:  Head: Normocephalic.  Mouth/Throat: Oropharynx is clear and moist.  Eyes: Pupils are equal, round, and reactive to light.  Neck: Neck supple.  Cardiovascular: Normal rate and normal heart sounds.  No murmur heard. Pulmonary/Chest: Effort normal and breath sounds normal. No respiratory distress. No wheezes. She has no rales.  Abdominal: Soft. Bowel sounds are normal. No distension. There is no tenderness. There is no rebound. Did Feel Small Para Umbilical Hernia on Coughing Musculoskeletal: Her Right Arm has recovered Completly Lymphadenopathy: none Neurological: Alert and oriented to person, place, and time.  Skin: Skin is warm and dry.  Psychiatric: Normal mood and affect. Behavior is normal. Thought content normal.    Labs reviewed: Basic  Metabolic Panel: Recent Labs    09/05/18 1450 12/13/18 0000 03/14/19 0810  NA 141  --  139  K 4.1  --  4.4  CL 106  --  104  CO2 27  --  23  GLUCOSE 92  --  99  BUN 15  --  15  CREATININE 0.59*  --  0.59*  CALCIUM 8.8  --  9.0  TSH 5.11* 0.88 2.41   Liver Function Tests: Recent Labs    09/05/18 1450 03/14/19 0810  AST 16 19  ALT 14 17  BILITOT 0.6 0.5  PROT 6.0* 6.3   No results for input(s): LIPASE, AMYLASE in the last 8760 hours. No results for input(s): AMMONIA in the last 8760 hours. CBC: Recent Labs    09/05/18 1450 03/14/19 0810  WBC 7.4 8.5  NEUTROABS 4,233 5,007  HGB 12.8 14.1  HCT 38.1 40.9  MCV 95.5 95.8  PLT 258 296   Lipid  Panel: Recent Labs    09/05/18 1450 03/14/19 0810  CHOL 213* 252*  HDL 52 52  LDLCALC 140* 176*  TRIG 101 111  CHOLHDL 4.1 4.8   No results found for: HGBA1C  Procedures since last visit: No results found.  Assessment/Plan Small Umbilical hernia Exam iis Benign Discused Options with her that if it does get that she is not able to reduce her Hernia then she needs to go to the emergency room especially if she is having pain. Otherwise we will just wait and monitor. She continues to have these episodes she does wants to be referred to surgery.  Right Elbow ORIF Is doing really Well. Working with therapy   Labs/tests ordered:  * No order type specified * Next appt:  07/18/2019

## 2019-06-05 ENCOUNTER — Telehealth: Payer: Self-pay | Admitting: *Deleted

## 2019-06-05 DIAGNOSIS — K429 Umbilical hernia without obstruction or gangrene: Secondary | ICD-10-CM

## 2019-06-05 NOTE — Telephone Encounter (Signed)
I have made the referal 

## 2019-06-05 NOTE — Telephone Encounter (Signed)
Patient calling stating that she is having hernia pain again and it's now sticking back out, pt would like a surgical consult referral. Pt did not want to go to the ED.

## 2019-06-06 ENCOUNTER — Encounter: Payer: Self-pay | Admitting: Internal Medicine

## 2019-06-17 ENCOUNTER — Encounter: Payer: Self-pay | Admitting: Internal Medicine

## 2019-07-12 ENCOUNTER — Other Ambulatory Visit (HOSPITAL_COMMUNITY): Payer: Self-pay | Admitting: General Surgery

## 2019-07-12 DIAGNOSIS — M7989 Other specified soft tissue disorders: Secondary | ICD-10-CM

## 2019-07-14 ENCOUNTER — Other Ambulatory Visit: Payer: Self-pay | Admitting: Internal Medicine

## 2019-07-14 DIAGNOSIS — M816 Localized osteoporosis [Lequesne]: Secondary | ICD-10-CM

## 2019-07-16 ENCOUNTER — Other Ambulatory Visit: Payer: Self-pay

## 2019-07-16 ENCOUNTER — Ambulatory Visit (HOSPITAL_COMMUNITY)
Admission: RE | Admit: 2019-07-16 | Discharge: 2019-07-16 | Disposition: A | Discharge status: Home / Self Care | Payer: Medicare Other | Source: Ambulatory Visit | Attending: General Surgery | Admitting: General Surgery

## 2019-07-16 DIAGNOSIS — M79605 Pain in left leg: Secondary | ICD-10-CM | POA: Diagnosis not present

## 2019-07-16 DIAGNOSIS — M7989 Other specified soft tissue disorders: Secondary | ICD-10-CM | POA: Diagnosis not present

## 2019-07-16 NOTE — Progress Notes (Signed)
Left lower extremity venous duplex completed. Refer to "CV Proc" under chart review to view preliminary results.  07/16/2019 1:20 PM Kelby Aline., MHA, RVT, RDCS, RDMS

## 2019-07-17 ENCOUNTER — Other Ambulatory Visit: Payer: Self-pay | Admitting: Internal Medicine

## 2019-07-17 DIAGNOSIS — E782 Mixed hyperlipidemia: Secondary | ICD-10-CM

## 2019-07-17 DIAGNOSIS — M810 Age-related osteoporosis without current pathological fracture: Secondary | ICD-10-CM

## 2019-07-17 DIAGNOSIS — E611 Iron deficiency: Secondary | ICD-10-CM

## 2019-07-17 DIAGNOSIS — I872 Venous insufficiency (chronic) (peripheral): Secondary | ICD-10-CM

## 2019-07-18 ENCOUNTER — Other Ambulatory Visit: Payer: Self-pay

## 2019-07-18 DIAGNOSIS — E611 Iron deficiency: Secondary | ICD-10-CM

## 2019-07-18 DIAGNOSIS — I872 Venous insufficiency (chronic) (peripheral): Secondary | ICD-10-CM

## 2019-07-18 DIAGNOSIS — E782 Mixed hyperlipidemia: Secondary | ICD-10-CM

## 2019-07-18 DIAGNOSIS — M810 Age-related osteoporosis without current pathological fracture: Secondary | ICD-10-CM

## 2019-07-19 LAB — CBC WITH DIFFERENTIAL/PLATELET
Absolute Monocytes: 509 cells/uL (ref 200–950)
Basophils Absolute: 53 cells/uL (ref 0–200)
Basophils Relative: 0.7 %
Eosinophils Absolute: 167 cells/uL (ref 15–500)
Eosinophils Relative: 2.2 %
HCT: 40.4 % (ref 35.0–45.0)
Hemoglobin: 13.7 g/dL (ref 11.7–15.5)
Lymphs Abs: 2576 cells/uL (ref 850–3900)
MCH: 32.7 pg (ref 27.0–33.0)
MCHC: 33.9 g/dL (ref 32.0–36.0)
MCV: 96.4 fL (ref 80.0–100.0)
MPV: 11.1 fL (ref 7.5–12.5)
Monocytes Relative: 6.7 %
Neutro Abs: 4294 cells/uL (ref 1500–7800)
Neutrophils Relative %: 56.5 %
Platelets: 263 10*3/uL (ref 140–400)
RBC: 4.19 10*6/uL (ref 3.80–5.10)
RDW: 12.7 % (ref 11.0–15.0)
Total Lymphocyte: 33.9 %
WBC: 7.6 10*3/uL (ref 3.8–10.8)

## 2019-07-19 LAB — LIPID PANEL
Cholesterol: 233 mg/dL — ABNORMAL HIGH (ref <200)
HDL: 58 mg/dL (ref >=50)
LDL Cholesterol (Calc): 156 mg/dL (calc) — ABNORMAL HIGH
Non-HDL Cholesterol (Calc): 175 mg/dL (calc) — ABNORMAL HIGH (ref <130)
Total CHOL/HDL Ratio: 4 (calc) (ref <5.0)
Triglycerides: 89 mg/dL (ref <150)

## 2019-07-19 LAB — VITAMIN D 25 HYDROXY (VIT D DEFICIENCY, FRACTURES): Vit D, 25-Hydroxy: 42 ng/mL (ref 30–100)

## 2019-07-19 LAB — COMPLETE METABOLIC PANEL WITH GFR
AG Ratio: 2 (calc) (ref 1.0–2.5)
ALT: 13 U/L (ref 6–29)
AST: 13 U/L (ref 10–35)
Albumin: 4.1 g/dL (ref 3.6–5.1)
Alkaline phosphatase (APISO): 58 U/L (ref 37–153)
BUN: 15 mg/dL (ref 7–25)
CO2: 28 mmol/L (ref 20–32)
Calcium: 8.7 mg/dL (ref 8.6–10.4)
Chloride: 105 mmol/L (ref 98–110)
Creat: 0.6 mg/dL (ref 0.60–0.93)
GFR, Est African American: 101 mL/min/{1.73_m2} (ref >=60)
GFR, Est Non African American: 87 mL/min/{1.73_m2} (ref >=60)
Globulin: 2.1 g/dL (calc) (ref 1.9–3.7)
Glucose, Bld: 91 mg/dL (ref 65–99)
Potassium: 4.4 mmol/L (ref 3.5–5.3)
Sodium: 139 mmol/L (ref 135–146)
Total Bilirubin: 0.5 mg/dL (ref 0.2–1.2)
Total Protein: 6.2 g/dL (ref 6.1–8.1)

## 2019-07-28 ENCOUNTER — Encounter: Payer: Self-pay | Admitting: Internal Medicine

## 2019-07-28 DIAGNOSIS — M816 Localized osteoporosis [Lequesne]: Secondary | ICD-10-CM

## 2019-07-29 MED ORDER — ALENDRONATE SODIUM 70 MG PO TABS: ORAL_TABLET | ORAL | 4 refills | Status: DC

## 2019-07-29 MED ORDER — L-METHYLFOLATE-B6-B12 3-35-2 MG PO TABS: 1.0000 | ORAL_TABLET | Freq: Every day | ORAL | 1 refills | Status: DC

## 2019-07-29 NOTE — Telephone Encounter (Signed)
Patient requested refill through My Chart. Faxed to pharmacy.

## 2019-07-31 ENCOUNTER — Encounter: Payer: Self-pay | Admitting: Internal Medicine

## 2019-07-31 ENCOUNTER — Non-Acute Institutional Stay: Payer: Medicare Other | Admitting: Internal Medicine

## 2019-07-31 ENCOUNTER — Other Ambulatory Visit: Payer: Self-pay

## 2019-07-31 VITALS — BP 110/80 | HR 63 | Temp 97.6°F | Ht 62.5 in | Wt 152.8 lb

## 2019-07-31 DIAGNOSIS — E782 Mixed hyperlipidemia: Secondary | ICD-10-CM | POA: Diagnosis not present

## 2019-07-31 DIAGNOSIS — K429 Umbilical hernia without obstruction or gangrene: Secondary | ICD-10-CM | POA: Diagnosis not present

## 2019-07-31 DIAGNOSIS — M810 Age-related osteoporosis without current pathological fracture: Secondary | ICD-10-CM | POA: Diagnosis not present

## 2019-07-31 DIAGNOSIS — E039 Hypothyroidism, unspecified: Secondary | ICD-10-CM

## 2019-07-31 DIAGNOSIS — I872 Venous insufficiency (chronic) (peripheral): Secondary | ICD-10-CM

## 2019-07-31 DIAGNOSIS — Z9889 Other specified postprocedural states: Secondary | ICD-10-CM

## 2019-07-31 DIAGNOSIS — Z8781 Personal history of (healed) traumatic fracture: Secondary | ICD-10-CM

## 2019-07-31 DIAGNOSIS — D509 Iron deficiency anemia, unspecified: Secondary | ICD-10-CM

## 2019-07-31 DIAGNOSIS — E538 Deficiency of other specified B group vitamins: Secondary | ICD-10-CM

## 2019-07-31 NOTE — Progress Notes (Signed)
Location: [place] of Service:  Clinic (12)  Provider:   Code Status:  Goals of Care:  Advanced Directives 04/10/2019  Does Patient Have a Medical Advance Directive? Yes  Type of Advance Directive Living will  Does patient want to make changes to medical advance directive? No - Patient declined  Copy of [place] in Chart? -  Pre-existing out of facility DNR order (yellow form or pink MOST form) -     Chief Complaint  Patient presents with  . Medical Management of Chronic Issues    4 month follow up. Patient would like to discuss her BMs, fatigue, Left foot/ankle swelling.    HPI: Patient is a 79 y.o. female seen today for an acute visit for Follow up of her issues  S/P ORIF for Distal Humerus Fracture Has mild Contracture in Right Arm. Is working with Ortho for slowly Extending her arm. Say her Muscles hurt by Exrcise Umbilical hernia Saw Surgeon He said to Continue follow up. Come back if think of surgery Fatigue Seems to be the new issue recently since her surgery Diarrhea Has had this before Colonoscopy By Dr Jessica [name] was negative in 2019 He recommended Metamucil Mild LE Edema Dopplers ordered by Surgeon were Negative  Patient lives by herself in [place] still drives  Past Medical History:  Diagnosis Date  . Anemia 12/2015  . Bronchitis, chronic ([place])    in her 62s  . Cataract   . Chronic kidney disease    in 41s had nephritis  . Closed fracture of right distal humerus    right  . Hearing loss 05/03/2016  . High cholesterol   . Hypothyroidism   . Osteopenia   . Pericarditis 01/1999  . Pneumothorax 01/1999  . Thyroid disorder   . Vitamin B 12 deficiency     Past Surgical History:  Procedure Laterality Date  . birthmark      removed,left leg  . CATARACT EXTRACTION  2017  . ORIF HUMERUS FRACTURE Right 04/10/2019   Procedure: RIGHT OPEN REDUCTION INTERNAL FIXATION (ORIF) DISTAL HUMERUS FRACTURE WITH EXTENSION;  Surgeon:  Hiram Gash, MD;  Location: [facility];  Service: Orthopedics;  Laterality: Right;  . [place]?    Allergies  Allergen Reactions  . Atorvastatin     Muscle weakness  . Rosuvastatin     Muscle weaknes    Outpatient Encounter Medications as of 07/31/2019  Medication Sig  . alendronate (FOSAMAX) 70 MG tablet Take one tablet by mouth every 7 days. Take with a full glass of water on an empty stomach.  . calcium carbonate (OS-CAL) 1250 (500 Ca) MG chewable tablet Chew 1 tablet (1,250 mg total) by mouth daily. (Patient taking differently: Chew 1 tablet by mouth daily. Not a chewable)  . celecoxib (CELEBREX) 100 MG capsule Take 100 mg by mouth as needed.  . cholecalciferol (VITAMIN D) 1000 UNITS tablet Take 2,000 Units by mouth daily.   . clobetasol ointment (TEMOVATE) [id number] % Apply 1 application topically daily.   Marland Kitchen FERROUS SULFATE PO Take 1 tablet by mouth daily.  Marland Kitchen l-methylfolate-B6-B12 (METANX) 3-35-2 MG TABS tablet Take 1 tablet by mouth daily.  Marland Kitchen levothyroxine (SYNTHROID) 125 MCG tablet Take 1 tablet (125 mcg total) by mouth daily.  Marland Kitchen loratadine (CLARITIN) 10 MG tablet Take 1 tablet (10 mg total) by mouth daily.  . Multiple Vitamins-Minerals (MULTIVITAMIN WITH MINERALS) tablet Take 1 tablet by mouth daily.  Marland Kitchen Red  Yeast Rice 600 MG TABS Take 1 tablet by mouth daily.   . vitamin C (ASCORBIC ACID) 500 MG tablet Take 500 mg by mouth daily.  . [DISCONTINUED] levothyroxine (SYNTHROID) 125 MCG tablet TAKE 1 TABLET BY MOUTH  DAILY   No facility-administered encounter medications on file as of 07/31/2019.    Review of Systems:  Review of Systems  Constitutional: Positive for fatigue.  HENT: Negative.   Respiratory: Negative.   Cardiovascular: Positive for leg swelling.  Gastrointestinal: Positive for diarrhea. Negative for abdominal distention and abdominal pain.  Genitourinary: Negative.   Musculoskeletal: Negative.   Skin: Negative.     Neurological: Positive for weakness.  Psychiatric/Behavioral: Negative.   All other systems reviewed and are negative.   Health Maintenance  Topic Date Due  . INFLUENZA VACCINE  10/06/2019  . COLONOSCOPY  07/11/2020  . TETANUS/TDAP  05/30/2028  . DEXA SCAN  Completed  . COVID-19 Vaccine  Completed  . PNA vac Low Risk Adult  Completed    Physical Exam: Vitals:   07/31/19 1343  BP: 110/80  Pulse: 63  Temp: 97.6 F (36.4 C)  SpO2: 94%  Weight: 152 lb 12.8 oz (69.3 kg)  Height: 5' 2.5" (1.588 m)   Body mass index is 27.5 kg/m. Physical Exam Vitals reviewed.  Constitutional:      Appearance: Normal appearance.  HENT:     Head: Normocephalic.     Nose: Nose normal.     Mouth/Throat:     Mouth: Mucous membranes are moist.     Pharynx: Oropharynx is clear.  Eyes:     Pupils: Pupils are equal, round, and reactive to light.  Cardiovascular:     Rate and Rhythm: Normal rate and regular rhythm.     Pulses: Normal pulses.  Pulmonary:     Effort: Pulmonary effort is normal.     Breath sounds: Normal breath sounds.  Abdominal:     General: Abdomen is flat. Bowel sounds are normal.     Palpations: Abdomen is soft.  Musculoskeletal:     Cervical back: Neck supple.     Comments: Mild Edema Bilateral  Skin:    General: Skin is warm.  Neurological:     General: No focal deficit present.     Mental Status: She is alert and oriented to person, place, and time.  Psychiatric:        Mood and Affect: Mood normal.        Thought Content: Thought content normal.     Labs reviewed: Basic Metabolic Panel: Recent Labs    09/05/18 1450 12/13/18 0000 03/14/19 0810 07/18/19 0750  NA 141  --  139 139  K 4.1  --  4.4 4.4  CL 106  --  104 105  CO2 27  --  23 28  GLUCOSE 92  --  99 91  BUN 15  --  15 15  CREATININE 0.59*  --  0.59* 0.60  CALCIUM 8.8  --  9.0 8.7  TSH 5.11* 0.88 2.41  --    Liver Function Tests: Recent Labs    09/05/18 1450 03/14/19 0810  07/18/19 0750  AST 16 19 13   ALT 14 17 13   BILITOT 0.6 0.5 0.5  PROT 6.0* 6.3 6.2   No results for input(s): LIPASE, AMYLASE in the last 8760 hours. No results for input(s): AMMONIA in the last 8760 hours. CBC: Recent Labs    09/05/18 1450 03/14/19 0810 07/18/19 0750  WBC 7.4 8.5 7.6  NEUTROABS 4,233  5,007 4,294  HGB 12.8 14.1 13.7  HCT 38.1 40.9 40.4  MCV 95.5 95.8 96.4  PLT 258 296 263   Lipid Panel: Recent Labs    09/05/18 1450 03/14/19 0810 07/18/19 0750  CHOL 213* 252* 233*  HDL 52 52 58  LDLCALC 140* 176* 156*  TRIG 101 111 89  CHOLHDL 4.1 4.8 4.0   No results found for: HGBA1C  Procedures since last visit: VAS [place] LOWER EXTREMITY VENOUS (DVT)  Result Date: 07/16/2019  Lower Venous DVTStudy Indications: Swelling.  Comparison Study: No prior study Performing Technologist: Maudry Mayhew MHA, RDMS, RVT, RDCS  Examination Guidelines: A complete evaluation includes B-mode imaging, spectral Doppler, color Doppler, and power Doppler as needed of all accessible portions of each vessel. Bilateral testing is considered an integral part of a complete examination. Limited examinations for reoccurring indications may be performed as noted. The reflux portion of the exam is performed with the patient in reverse Trendelenburg.  +-----+---------------+---------+-----------+----------+--------------+ RIGHTCompressibilityPhasicitySpontaneityPropertiesThrombus Aging +-----+---------------+---------+-----------+----------+--------------+ CFV  Full           Yes      Yes                                 +-----+---------------+---------+-----------+----------+--------------+   +---------+---------------+---------+-----------+----------+--------------+ LEFT     CompressibilityPhasicitySpontaneityPropertiesThrombus Aging +---------+---------------+---------+-----------+----------+--------------+ CFV      Full           Yes      Yes                                  +---------+---------------+---------+-----------+----------+--------------+ SFJ      Full                                                        +---------+---------------+---------+-----------+----------+--------------+ FV Prox  Full                                                        +---------+---------------+---------+-----------+----------+--------------+ FV Mid   Full                                                        +---------+---------------+---------+-----------+----------+--------------+ FV DistalFull                                                        +---------+---------------+---------+-----------+----------+--------------+ PFV      Full                                                        +---------+---------------+---------+-----------+----------+--------------+ POP  Full           Yes      Yes                                 +---------+---------------+---------+-----------+----------+--------------+ PTV      Full                                                        +---------+---------------+---------+-----------+----------+--------------+ PERO     Full                                                        +---------+---------------+---------+-----------+----------+--------------+     Summary: RIGHT: - No evidence of common femoral vein obstruction.  LEFT: - There is no evidence of deep vein thrombosis in the lower extremity.  - No cystic structure found in the popliteal fossa.  *See table(s) above for measurements and observations. Electronically signed by Harold Barban MD on 07/16/2019 at 20:46:39 PM.    Final     Assessment/Plan S/P ORIF (open reduction internal fixation) fracture Has Mild Contracture of the right arm She is working with Ortho and doing her own exercises to correct it Umbilical hernia without obstruction and without gangrene Saw Surgery They do not think that she has any issues with  obstructions They want to do elective surgery she wants to wait till November Senile osteoporosis Continue on Fosamax Needs repeat DEXA in 7/21 Diarrhea Is taking Metamucil higher dose States better Colonoscopy in 5/19 was negative Diarrhea seems functional will call GI if it does not improve  Mixed hyperlipidemia Does not have any risk factors or family history of CAD We have talked about statins She wants to continue diet and exercise for now  Edema of both lower extremities due to peripheral venous insufficiency Dopplers done recently were negative Continue restricting salt and elevating legs Can use TED hoses Fatigue Not sure of etiology Blood work looks really good We will continue to follow Hypothyroidism, unspecified type TSH in normal limits Iron deficiency anemia, unspecified iron deficiency anemia type Continue low-dose of iron Colonoscopy in 519 was negative Hgb Normal Can reduce the dose Has history of shortness of breath See pulmonologist recommended bronchodilators but patient wants to use Deep Breathing Vitamin B 12 deficiency Levels Normal Continue Supplements 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

## 2019-08-13 ENCOUNTER — Other Ambulatory Visit: Payer: Self-pay

## 2019-08-13 ENCOUNTER — Non-Acute Institutional Stay: Payer: Medicare Other | Admitting: Nurse Practitioner

## 2019-08-13 ENCOUNTER — Encounter: Payer: Self-pay | Admitting: Nurse Practitioner

## 2019-08-13 DIAGNOSIS — H6123 Impacted cerumen, bilateral: Secondary | ICD-10-CM | POA: Diagnosis not present

## 2019-08-14 ENCOUNTER — Encounter: Payer: Self-pay | Admitting: Internal Medicine

## 2019-08-14 DIAGNOSIS — M816 Localized osteoporosis [Lequesne]: Secondary | ICD-10-CM

## 2019-08-14 MED ORDER — ALENDRONATE SODIUM 70 MG PO TABS: ORAL_TABLET | ORAL | 4 refills | Status: DC

## 2019-08-20 ENCOUNTER — Encounter: Payer: Self-pay | Admitting: Nurse Practitioner

## 2019-08-20 DIAGNOSIS — H6123 Impacted cerumen, bilateral: Secondary | ICD-10-CM | POA: Insufficient documentation

## 2019-08-20 NOTE — Assessment & Plan Note (Signed)
Obstructive, copious, extremely hard, dry cerumen was seen in the R+L ear canal with magnification otoscope. Cerumen impairs exam of tympanic membrane. The patient has worsened hard of hearing. The patient R+L ear was irrigated with warm water, followed with  wax curettes and forceps attempted, then irrigated again, then  impacted ear wax was removed with wax curettes.

## 2019-08-20 NOTE — Progress Notes (Signed)
Obstructive, copious, extremely hard, dry cerumen was seen in the R+L ear canal with magnification otoscope. Cerumen impaired the exam of tympanic membrane along with complaining of worsened hard of hearing. The patient R+L ear was irrigated with warm water, then wax curettes and forceps attempted, then irrigated again, then  impacted ear wax was removed with wax curettes.

## 2019-08-27 ENCOUNTER — Ambulatory Visit: Payer: Medicare Other | Admitting: Gastroenterology

## 2019-08-28 ENCOUNTER — Encounter: Payer: Self-pay | Admitting: Internal Medicine

## 2019-08-28 ENCOUNTER — Non-Acute Institutional Stay: Payer: Medicare Other | Admitting: Internal Medicine

## 2019-08-28 ENCOUNTER — Other Ambulatory Visit: Payer: Self-pay

## 2019-08-28 VITALS — BP 122/96 | HR 58 | Temp 97.8°F | Ht 62.5 in | Wt 155.4 lb

## 2019-08-28 DIAGNOSIS — T63301A Toxic effect of unspecified spider venom, accidental (unintentional), initial encounter: Secondary | ICD-10-CM | POA: Diagnosis not present

## 2019-08-28 NOTE — Progress Notes (Signed)
Location: [place] of Service:  Clinic (12)  Provider:   Code Status:  Goals of Care:  Advanced Directives 04/10/2019  Does Patient Have a Medical Advance Directive? Yes  Type of Advance Directive Living will  Does patient want to make changes to medical advance directive? No - Patient declined  Copy of [place] in Chart? -  Pre-existing out of facility DNR order (yellow form or pink MOST form) -     Chief Complaint  Patient presents with   Acute Visit    Spider bite on right leg.     HPI: Patient is a 79 y.o. female seen today for an acute visit for Spider bite in her Right leg  Patient came as she had spider bite on her right leg. Patient stated it happened overnight because she woke up and she saw the bite in her leg.  She had a small blister with redness around it no pain or itching at this time. Past Medical History:  Diagnosis Date   Anemia 12/2015   Bronchitis, chronic (HCC)    in her 41s   Cataract    Chronic kidney disease    in 20s had nephritis   Closed fracture of right distal humerus    right   Hearing loss 05/03/2016   High cholesterol    Hypothyroidism    Osteopenia    Pericarditis 01/1999   Pneumothorax 01/1999   Thyroid disorder    Vitamin B 12 deficiency     Past Surgical History:  Procedure Laterality Date   birthmark      removed,left leg   CATARACT EXTRACTION  2017   ORIF HUMERUS FRACTURE Right 04/10/2019   Procedure: RIGHT OPEN REDUCTION INTERNAL FIXATION (ORIF) DISTAL HUMERUS FRACTURE WITH EXTENSION;  Surgeon: Hiram Gash, MD;  Location: [facility];  Service: Orthopedics;  Laterality: Right;   [place]?    Allergies  Allergen Reactions   Atorvastatin     Muscle weakness   Rosuvastatin     Muscle weaknes    Outpatient Encounter Medications as of 08/28/2019  Medication Sig   alendronate (FOSAMAX) 70 MG tablet Take one tablet by  mouth every 7 days. Take with a full glass of water on an empty stomach.   calcium carbonate (OS-CAL) 1250 (500 Ca) MG chewable tablet Chew 1 tablet (1,250 mg total) by mouth daily. (Patient taking differently: Chew 1 tablet by mouth daily. Not a chewable)   celecoxib (CELEBREX) 100 MG capsule Take 100 mg by mouth as needed.   cholecalciferol (VITAMIN D) 1000 UNITS tablet Take 2,000 Units by mouth daily.    clobetasol ointment (TEMOVATE) 1.54 % Apply 1 application topically daily.    FERROUS SULFATE PO Take 1 tablet by mouth daily.   l-methylfolate-B6-B12 (METANX) 3-35-2 MG TABS tablet Take 1 tablet by mouth daily.   levothyroxine (SYNTHROID) 125 MCG tablet Take 1 tablet (125 mcg total) by mouth daily.   loratadine (CLARITIN) 10 MG tablet Take 1 tablet (10 mg total) by mouth daily.   Multiple Vitamins-Minerals (MULTIVITAMIN WITH MINERALS) tablet Take 1 tablet by mouth daily.   Red Yeast Rice 600 MG TABS Take 1 tablet by mouth daily.    vitamin C (ASCORBIC ACID) 500 MG tablet Take 500 mg by mouth daily.   No facility-administered encounter medications on file as of 08/28/2019.    Review of Systems:  Review of Systems  Health Maintenance  Topic Date Due  Hepatitis C Screening  Never done   INFLUENZA VACCINE  10/06/2019   COLONOSCOPY  07/11/2020   TETANUS/TDAP  05/30/2028   DEXA SCAN  Completed   COVID-19 Vaccine  Completed   PNA vac Low Risk Adult  Completed    Physical Exam: Vitals:   08/28/19 1559  BP: (!) 122/96  Pulse: (!) 58  Temp: 97.8 F (36.6 C)  SpO2: 93%  Weight: 155 lb 6.4 oz (70.5 kg)  Height: 5' 2.5" (1.588 m)   Body mass index is 27.97 kg/m. Physical Exam Vitals reviewed.  Constitutional:      Appearance: Normal appearance.  HENT:     Head: Normocephalic.     Nose: Nose normal.     Mouth/Throat:     Mouth: Mucous membranes are moist.     Pharynx: Oropharynx is clear.  Eyes:     Pupils: Pupils are equal, round, and reactive to light.    Cardiovascular:     Rate and Rhythm: Normal rate and regular rhythm.     Pulses: Normal pulses.  Pulmonary:     Effort: Pulmonary effort is normal.     Breath sounds: Normal breath sounds.  Abdominal:     General: Abdomen is flat. Bowel sounds are normal.     Palpations: Abdomen is soft.  Musculoskeletal:        General: Swelling present.     Cervical back: Neck supple.  Skin:    Comments: Small 2 blisters in right leg with redness around it  Neurological:     General: No focal deficit present.     Mental Status: She is alert and oriented to person, place, and time.  Psychiatric:        Mood and Affect: Mood normal.     Labs reviewed: Basic Metabolic Panel: Recent Labs    09/05/18 1450 12/13/18 0000 03/14/19 0810 07/18/19 0750  NA 141  --  139 139  K 4.1  --  4.4 4.4  CL 106  --  104 105  CO2 27  --  23 28  GLUCOSE 92  --  99 91  BUN 15  --  15 15  CREATININE 0.59*  --  0.59* 0.60  CALCIUM 8.8  --  9.0 8.7  TSH 5.11* 0.88 2.41  --    Liver Function Tests: Recent Labs    09/05/18 1450 03/14/19 0810 07/18/19 0750  AST 16 19 13   ALT 14 17 13   BILITOT 0.6 0.5 0.5  PROT 6.0* 6.3 6.2   No results for input(s): LIPASE, AMYLASE in the last 8760 hours. No results for input(s): AMMONIA in the last 8760 hours. CBC: Recent Labs    09/05/18 1450 03/14/19 0810 07/18/19 0750  WBC 7.4 8.5 7.6  NEUTROABS 4,233 5,007 4,294  HGB 12.8 14.1 13.7  HCT 38.1 40.9 40.4  MCV 95.5 95.8 96.4  PLT 258 296 263   Lipid Panel: Recent Labs    09/05/18 1450 03/14/19 0810 07/18/19 0750  CHOL 213* 252* 233*  HDL 52 52 58  LDLCALC 140* 176* 156*  TRIG 101 111 89  CHOLHDL 4.1 4.8 4.0   No results found for: HGBA1C  Procedures since last visit: No results found.  Assessment/Plan  Spider bite wound, accidental or unintentional, initial encounter Hydrocortisone OTC cream on redness Do not touch the Blister Can take Ibuprofen for pain Benadryl tonight 12.5 mg for few  days Will come to our clinic for follow up if any redness increases or Blister gets bigger.  Does not need Antibiotics right now   Labs/tests ordered:  * No order type specified * Next appt:  01/23/2020

## 2019-08-30 ENCOUNTER — Other Ambulatory Visit: Payer: Self-pay

## 2019-08-30 ENCOUNTER — Encounter: Payer: Self-pay | Admitting: Nurse Practitioner

## 2019-08-30 ENCOUNTER — Ambulatory Visit (INDEPENDENT_AMBULATORY_CARE_PROVIDER_SITE_OTHER): Payer: Medicare Other | Admitting: Nurse Practitioner

## 2019-08-30 VITALS — BP 110/80 | HR 64 | Temp 97.4°F | Ht 62.5 in | Wt 154.0 lb

## 2019-08-30 DIAGNOSIS — T63301A Toxic effect of unspecified spider venom, accidental (unintentional), initial encounter: Secondary | ICD-10-CM

## 2019-08-30 DIAGNOSIS — W19XXXA Unspecified fall, initial encounter: Secondary | ICD-10-CM

## 2019-08-30 DIAGNOSIS — L03115 Cellulitis of right lower limb: Secondary | ICD-10-CM

## 2019-08-30 MED ORDER — DOXYCYCLINE HYCLATE 100 MG PO TABS: 100.0000 mg | ORAL_TABLET | Freq: Two times a day (BID) | ORAL | 0 refills | Status: DC

## 2019-08-30 NOTE — Progress Notes (Signed)
Careteam: Patient Care Team: Virgie Dad, MD as PCP - General (Internal Medicine) Ronald Lobo, MD as Consulting Physician (Gastroenterology) Katy Apo, MD as Consulting Physician (Ophthalmology) Martinique, Amy, MD as Consulting Physician (Dermatology) Mast, Man X, NP as Nurse Practitioner (Internal Medicine)  PLACE OF SERVICE:  [place]  Advanced Directive information    Allergies  Allergen Reactions  . Atorvastatin     Muscle weakness  . Rosuvastatin     Muscle weaknes    Chief Complaint  Patient presents with  . Acute Visit    Spider bite right leg. Patient fell about 30 minutes ago and broke blister open at site of spider bite. Got spider bite on Tuesday. Still draining     HPI: Patient is a 79 y.o. female for re-evaluation of spider bite to right leg.  On the way over to the office she slipped in the parking lot of [facility] where she lives. There was a small puddle that had mud in in. She hit both her elbows and left knee. Small abrasions noted but pt denies pain. Has good ROM without difficulty or tenderness.    Review of Systems:  Review of Systems  Constitutional: Negative for chills, fever, malaise/fatigue and weight loss.  HENT: Negative for tinnitus.   Respiratory: Negative for sputum production and shortness of breath.   Cardiovascular: Negative for chest pain, palpitations and leg swelling.  Musculoskeletal: Positive for falls (today). Negative for back pain, joint pain and myalgias.  Skin: Negative.   Neurological: Negative for dizziness and headaches.  Psychiatric/Behavioral: Negative for depression and memory loss. The patient does not have insomnia.    Past Medical History:  Diagnosis Date  . Anemia 12/2015  . Bronchitis, chronic ([facility])    in her 31s  . Cataract   . Chronic kidney disease    in 41s had nephritis  . Closed fracture of right distal humerus    right  . Hearing loss 05/03/2016  . High cholesterol   .  Hypothyroidism   . Osteopenia   . Pericarditis 01/1999  . Pneumothorax 01/1999  . Thyroid disorder   . Vitamin B 12 deficiency    Past Surgical History:  Procedure Laterality Date  . birthmark      removed,left leg  . CATARACT EXTRACTION  2017  . ORIF HUMERUS FRACTURE Right 04/10/2019   Procedure: RIGHT OPEN REDUCTION INTERNAL FIXATION (ORIF) DISTAL HUMERUS FRACTURE WITH EXTENSION;  Surgeon: Hiram Gash, MD;  Location: [facility];  Service: Orthopedics;  Laterality: Right;  . [place]?   Social History:   reports that she has never smoked. She has never used smokeless tobacco. She reports current alcohol use of about 3.0 standard drinks of alcohol per week. She reports that she does not use drugs.  Family History  Problem Relation Age of Onset  . Diabetes Mother   . Asthma Mother   . Ataxia Father   . Stroke Father   . Cancer - Cervical Sister   . Thyroid disease Sister   . Diabetes Other   . Diabetes Maternal Grandmother   . Arthritis Paternal Grandmother   . Colon cancer Neg Hx     Medications: Patient's Medications  New Prescriptions   No medications on file  Previous Medications   ALENDRONATE (FOSAMAX) 70 MG TABLET    Take one tablet by mouth every 7 days. Take with a full glass of water on an empty stomach.   CALCIUM CARBONATE (  OS-CAL) 1250 (500 CA) MG CHEWABLE TABLET    Chew 1 tablet (1,250 mg total) by mouth daily.   CELECOXIB (CELEBREX) 100 MG CAPSULE    Take 100 mg by mouth as needed.   CHOLECALCIFEROL (VITAMIN D) 1000 UNITS TABLET    Take 2,000 Units by mouth daily.    CLOBETASOL OINTMENT (TEMOVATE) 0.05 %    Apply 1 application topically daily.    FERROUS SULFATE PO    Take 1 tablet by mouth daily.   L-METHYLFOLATE-B6-B12 (METANX) 3-35-2 MG TABS TABLET    Take 1 tablet by mouth daily.   LEVOTHYROXINE (SYNTHROID) 125 MCG TABLET    Take 1 tablet (125 mcg total) by mouth daily.   MULTIPLE VITAMINS-MINERALS (MULTIVITAMIN  WITH MINERALS) TABLET    Take 1 tablet by mouth daily.   RED YEAST RICE 600 MG TABS    Take 1 tablet by mouth daily.    VITAMIN C (ASCORBIC ACID) 500 MG TABLET    Take 500 mg by mouth daily.  Modified Medications   No medications on file  Discontinued Medications   LORATADINE (CLARITIN) 10 MG TABLET    Take 1 tablet (10 mg total) by mouth daily.    Physical Exam:  Vitals:   08/30/19 1142  BP: 110/80  Pulse: 64  Temp: (!) 97.4 F (36.3 C)  TempSrc: Temporal  SpO2: 95%  Weight: 154 lb (69.9 kg)  Height: 5' 2.5" (1.588 m)   Body mass index is 27.72 kg/m. Wt Readings from Last 3 Encounters:  08/30/19 154 lb (69.9 kg)  08/28/19 155 lb 6.4 oz (70.5 kg)  07/31/19 152 lb 12.8 oz (69.3 kg)    Physical Exam Constitutional:      General: She is not in acute distress.    Appearance: She is well-developed. She is not diaphoretic.  HENT:     Head: Normocephalic and atraumatic.     Mouth/Throat:     Pharynx: No oropharyngeal exudate.  Eyes:     Conjunctiva/sclera: Conjunctivae normal.     Pupils: Pupils are equal, round, and reactive to light.  Cardiovascular:     Rate and Rhythm: Normal rate and regular rhythm.     Heart sounds: Normal heart sounds.  Pulmonary:     Effort: Pulmonary effort is normal.     Breath sounds: Normal breath sounds.  Abdominal:     General: Bowel sounds are normal.     Palpations: Abdomen is soft.  Musculoskeletal:        General: Tenderness (point tenderness to right elbow, normal ROM) present. No swelling. Normal range of motion.     Right elbow: Tenderness (mild, point tenderness, she says this is chronic) present.     Left elbow: Normal. No tenderness.     Right forearm: Normal.     Left forearm: Normal.     Right wrist: Normal.     Left wrist: Normal.     Cervical back: Normal range of motion and neck supple.     Right knee: Normal.     Left knee: Normal.     Comments: Small Abrasions noted to bilateral elbow and knees Knees with slight  redness noted.   Skin:    General: Skin is warm and dry.     Findings: Erythema (on spots left lower leg with serous drainage filling the blister on left mid leg) present.  Neurological:     Mental Status: She is alert and oriented to person, place, and time.     Labs  reviewed: Basic Metabolic Panel: Recent Labs    09/05/18 1450 12/13/18 0000 03/14/19 0810 07/18/19 0750  NA 141  --  139 139  K 4.1  --  4.4 4.4  CL 106  --  104 105  CO2 27  --  23 28  GLUCOSE 92  --  99 91  BUN 15  --  15 15  CREATININE 0.59*  --  0.59* 0.60  CALCIUM 8.8  --  9.0 8.7  TSH 5.11* 0.88 2.41  --    Liver Function Tests: Recent Labs    09/05/18 1450 03/14/19 0810 07/18/19 0750  AST 16 19 13   ALT 14 17 13   BILITOT 0.6 0.5 0.5  PROT 6.0* 6.3 6.2   No results for input(s): LIPASE, AMYLASE in the last 8760 hours. No results for input(s): AMMONIA in the last 8760 hours. CBC: Recent Labs    09/05/18 1450 03/14/19 0810 07/18/19 0750  WBC 7.4 8.5 7.6  NEUTROABS 4,233 5,007 4,294  HGB 12.8 14.1 13.7  HCT 38.1 40.9 40.4  MCV 95.5 95.8 96.4  PLT 258 296 263   Lipid Panel: Recent Labs    09/05/18 1450 03/14/19 0810 07/18/19 0750  CHOL 213* 252* 233*  HDL 52 52 58  LDLCALC 140* 176* 156*  TRIG 101 111 89  CHOLHDL 4.1 4.8 4.0   TSH: Recent Labs    09/05/18 1450 12/13/18 0000 03/14/19 0810  TSH 5.11* 0.88 2.41   A1C: No results found for: HGBA1C   Assessment/Plan 1. Cellulitis of right lower extremity Redness around spider bite on leg, will start doxycyline twice daily, to take with food and add probiotic twice daily until complete.  - doxycycline (VIBRA-TABS) 100 MG tablet; Take 1 tablet (100 mg total) by mouth 2 (two) times daily.  Dispense: 14 tablet; Refill: 0  2. Spider bite wound, accidental or unintentional, initial encounter Ongoing, large blister with serous drainage noted, cleaned area.  3. Fall, initial encounter With abrasion noted to elbows and knees. No  pain noted with normal ROM to all joints. Encouraged pt to ice joints 2-3 times over today and tomorrow. May have increase tenderness and mild swelling to joints. To notify if become red, moderate to severe swelling, increase in pain occurs.   Carlos American. [place], [facility] [phone]

## 2019-08-30 NOTE — Patient Instructions (Addendum)
To doxycyline by mouth twice daily take with food Add probiotic twice daily Good hydration and nutrition   monitor for worsening redness or purulent drainage, fever, heat, tenderness- notify for this.   To ice the knees and elbows twice daily for  today and tomorrow at least.

## 2019-09-06 ENCOUNTER — Telehealth: Payer: Self-pay

## 2019-09-06 NOTE — Telephone Encounter (Signed)
LMOM to return call if interested in scheduling AWV with [facility].

## 2019-09-08 ENCOUNTER — Other Ambulatory Visit: Payer: Self-pay | Admitting: Internal Medicine

## 2019-09-08 DIAGNOSIS — M816 Localized osteoporosis [Lequesne]: Secondary | ICD-10-CM

## 2019-09-11 ENCOUNTER — Other Ambulatory Visit: Payer: Self-pay

## 2019-09-11 ENCOUNTER — Encounter: Payer: Medicare Other | Admitting: Internal Medicine

## 2019-09-17 ENCOUNTER — Non-Acute Institutional Stay: Payer: Medicare Other | Admitting: Nurse Practitioner

## 2019-09-17 ENCOUNTER — Other Ambulatory Visit: Payer: Self-pay

## 2019-09-17 ENCOUNTER — Encounter: Payer: Self-pay | Admitting: Nurse Practitioner

## 2019-09-17 VITALS — HR 68 | Temp 97.4°F | Ht 62.5 in | Wt 155.6 lb

## 2019-09-17 DIAGNOSIS — Z0000 Encounter for general adult medical examination without abnormal findings: Secondary | ICD-10-CM

## 2019-09-17 NOTE — Patient Instructions (Signed)
[name] , Thank you for taking time to come for your Medicare Wellness Visit. I appreciate your ongoing commitment to your health goals. Please review the following plan we discussed and let me know if I can assist you in the future.   Screening recommendations/referrals: Colonoscopy aged out Mammogram aged out Bone Density declined Recommended yearly ophthalmology/optometry visit for glaucoma screening and checkup Recommended yearly dental visit for hygiene and checkup  Vaccinations: Influenza vaccine up to date Pneumococcal vaccine up to date Tdap vaccine will check if its due Shingles vaccine up to date  Advanced directives: living will, HPOA  Conditions/risks identified: yes  Next appointment: 1 year   Preventive Care 18 Years and Older, Female Preventive care refers to lifestyle choices and visits with your health care provider that can promote health and wellness. What does preventive care include?  A yearly physical exam. This is also called an annual well check.  Dental exams once or twice a year.  Routine eye exams. Ask your health care provider how often you should have your eyes checked.  Personal lifestyle choices, including:  Daily care of your teeth and gums.  Regular physical activity.  Eating a healthy diet.  Avoiding tobacco and drug use.  Limiting alcohol use.  Practicing safe sex.  Taking low-dose aspirin every day.  Taking vitamin and mineral supplements as recommended by your health care provider. What happens during an annual well check? The services and screenings done by your health care provider during your annual well check will depend on your age, overall health, lifestyle risk factors, and family history of disease. Counseling  Your health care provider may ask you questions about your:  Alcohol use.  Tobacco use.  Drug use.  Emotional well-being.  Home and relationship well-being.  Sexual activity.  Eating  habits.  History of falls.  Memory and ability to understand (cognition).  Work and work Statistician.  Reproductive health. Screening  You may have the following tests or measurements:  Height, weight, and BMI.  Blood pressure.  Lipid and cholesterol levels. These may be checked every 5 years, or more frequently if you are over 10 years old.  Skin check.  Lung cancer screening. You may have this screening every year starting at age 90 if you have a 30-pack-year history of smoking and currently smoke or have quit within the past 15 years.  Fecal occult blood test (FOBT) of the stool. You may have this test every year starting at age 35.  Flexible sigmoidoscopy or colonoscopy. You may have a sigmoidoscopy every 5 years or a colonoscopy every 10 years starting at age 64.  Hepatitis C blood test.  Hepatitis B blood test.  Sexually transmitted disease (STD) testing.  Diabetes screening. This is done by checking your blood sugar (glucose) after you have not eaten for a while (fasting). You may have this done every 1-3 years.  Bone density scan. This is done to screen for osteoporosis. You may have this done starting at age 82.  Mammogram. This may be done every 1-2 years. Talk to your health care provider about how often you should have regular mammograms. Talk with your health care provider about your test results, treatment options, and if necessary, the need for more tests. Vaccines  Your health care provider may recommend certain vaccines, such as:  Influenza vaccine. This is recommended every year.  Tetanus, diphtheria, and acellular pertussis (Tdap, Td) vaccine. You may need a Td booster every 10 years.  Zoster vaccine. You  may need this after age 64.  Pneumococcal 13-valent conjugate (PCV13) vaccine. One dose is recommended after age 68.  Pneumococcal polysaccharide (PPSV23) vaccine. One dose is recommended after age 98. Talk to your health care provider about which  screenings and vaccines you need and how often you need them. This information is not intended to replace advice given to you by your health care provider. Make sure you discuss any questions you have with your health care provider. Document Released: 03/20/2015 Document Revised: 11/11/2015 Document Reviewed: 12/23/2014 Elsevier Interactive Patient Education  2017 [employer] Prevention in the Home Falls can cause injuries. They can happen to people of all ages. There are many things you can do to make your home safe and to help prevent falls. What can I do on the outside of my home?  Regularly fix the edges of walkways and driveways and fix any cracks.  Remove anything that might make you trip as you walk through a door, such as a raised step or threshold.  Trim any bushes or trees on the path to your home.  Use bright outdoor lighting.  Clear any walking paths of anything that might make someone trip, such as rocks or tools.  Regularly check to see if handrails are loose or broken. Make sure that both sides of any steps have handrails.  Any raised decks and porches should have guardrails on the edges.  Have any leaves, snow, or ice cleared regularly.  Use sand or salt on walking paths during winter.  Clean up any spills in your garage right away. This includes oil or grease spills. What can I do in the bathroom?  Use night lights.  Install grab bars by the toilet and in the tub and shower. Do not use towel bars as grab bars.  Use non-skid mats or decals in the tub or shower.  If you need to sit down in the shower, use a plastic, non-slip stool.  Keep the floor dry. Clean up any water that spills on the floor as soon as it happens.  Remove soap buildup in the tub or shower regularly.  Attach bath mats securely with double-sided non-slip rug tape.  Do not have throw rugs and other things on the floor that can make you trip. What can I do in the bedroom?  Use  night lights.  Make sure that you have a light by your bed that is easy to reach.  Do not use any sheets or blankets that are too big for your bed. They should not hang down onto the floor.  Have a firm chair that has side arms. You can use this for support while you get dressed.  Do not have throw rugs and other things on the floor that can make you trip. What can I do in the kitchen?  Clean up any spills right away.  Avoid walking on wet floors.  Keep items that you use a lot in easy-to-reach places.  If you need to reach something above you, use a strong step stool that has a grab bar.  Keep electrical cords out of the way.  Do not use floor polish or wax that makes floors slippery. If you must use wax, use non-skid floor wax.  Do not have throw rugs and other things on the floor that can make you trip. What can I do with my stairs?  Do not leave any items on the stairs.  Make sure that there are handrails on both  sides of the stairs and use them. Fix handrails that are broken or loose. Make sure that handrails are as long as the stairways.  Check any carpeting to make sure that it is firmly attached to the stairs. Fix any carpet that is loose or worn.  Avoid having throw rugs at the top or bottom of the stairs. If you do have throw rugs, attach them to the floor with carpet tape.  Make sure that you have a light switch at the top of the stairs and the bottom of the stairs. If you do not have them, ask someone to add them for you. What else can I do to help prevent falls?  Wear shoes that:  Do not have high heels.  Have rubber bottoms.  Are comfortable and fit you well.  Are closed at the toe. Do not wear sandals.  If you use a stepladder:  Make sure that it is fully opened. Do not climb a closed stepladder.  Make sure that both sides of the stepladder are locked into place.  Ask someone to hold it for you, if possible.  Clearly mark and make sure that you  can see:  Any grab bars or handrails.  First and last steps.  Where the edge of each step is.  Use tools that help you move around (mobility aids) if they are needed. These include:  Canes.  Walkers.  Scooters.  Crutches.  Turn on the lights when you go into a dark area. Replace any light bulbs as soon as they burn out.  Set up your furniture so you have a clear path. Avoid moving your furniture around.  If any of your floors are uneven, fix them.  If there are any pets around you, be aware of where they are.  Review your medicines with your doctor. Some medicines can make you feel dizzy. This can increase your chance of falling. Ask your doctor what other things that you can do to help prevent falls. This information is not intended to replace advice given to you by your health care provider. Make sure you discuss any questions you have with your health care provider. Document Released: 12/18/2008 Document Revised: 07/30/2015 Document Reviewed: 03/28/2014 Elsevier Interactive Patient Education  2017 [employer].

## 2019-09-17 NOTE — Progress Notes (Signed)
Subjective:   Jessica [name] is a 79 y.o. female who presents for Medicare Annual (Subsequent) preventive examination at clinic [facility]    Objective:    Today's Vitals   09/17/19 1412  Pulse: 68  Temp: (!) 97.4 F (36.3 C)  SpO2: 92%  Weight: 155 lb 9.6 oz (70.6 kg)  Height: 5' 2.5" (1.588 m)   Body mass index is 28.01 kg/m.  Advanced Directives 04/10/2019 04/08/2019 05/09/2018 03/06/2018 11/20/2017 11/08/2017 10/23/2017  Does Patient Have a Medical Advance Directive? Yes Yes Yes Yes Yes Yes Yes  Type of Advance Directive Living will Living will Healthcare Power of [place];Living will [place];Out of facility DNR (pink MOST or yellow form);Living will [place];Living will  Does patient want to make changes to medical advance directive? No - Patient declined No - Patient declined No - Patient declined No - Patient declined - - No - Patient declined  Copy of [place] in Chart? - - Yes - validated most recent copy scanned in chart (See row information) Yes - validated most recent copy scanned in chart (See row information) Yes Yes Yes  Pre-existing out of facility DNR order (yellow form or pink MOST form) - - - - - Yellow form placed in chart (order not valid for inpatient use);Pink MOST form placed in chart (order not valid for inpatient use) -    Current Medications (verified) Outpatient Encounter Medications as of 09/17/2019  Medication Sig  . alendronate (FOSAMAX) 70 MG tablet Take one tablet by mouth every 7 days. Take with a full glass of water on an empty stomach.  . calcium carbonate (OS-CAL) 1250 (500 Ca) MG chewable tablet Chew 1 tablet (1,250 mg total) by mouth daily. (Patient taking differently: Chew 1 tablet by mouth daily. Not a chewable)  . celecoxib (CELEBREX) 100 MG capsule Take 100 mg by mouth as needed.  . cholecalciferol (VITAMIN D) 1000 UNITS  tablet Take 2,000 Units by mouth daily.   . clobetasol ointment (TEMOVATE) 9.51 % Apply 1 application topically daily.   Marland Kitchen FERROUS SULFATE PO Take 1 tablet by mouth daily.  Marland Kitchen l-methylfolate-B6-B12 (METANX) 3-35-2 MG TABS tablet Take 1 tablet by mouth daily.  Marland Kitchen levothyroxine (SYNTHROID) 125 MCG tablet Take 1 tablet (125 mcg total) by mouth daily.  . Multiple Vitamins-Minerals (MULTIVITAMIN WITH MINERALS) tablet Take 1 tablet by mouth daily.  . Red Yeast Rice 600 MG TABS Take 1 tablet by mouth daily.   . vitamin C (ASCORBIC ACID) 500 MG tablet Take 500 mg by mouth daily.  . [DISCONTINUED] doxycycline (VIBRA-TABS) 100 MG tablet Take 1 tablet (100 mg total) by mouth 2 (two) times daily.   No facility-administered encounter medications on file as of 09/17/2019.    Allergies (verified) Atorvastatin and Rosuvastatin   History: Past Medical History:  Diagnosis Date  . Anemia 12/2015  . Bronchitis, chronic ([place])    in her 2s  . Cataract   . Chronic kidney disease    in 98s had nephritis  . Closed fracture of right distal humerus    right  . Hearing loss 05/03/2016  . High cholesterol   . Hypothyroidism   . Osteopenia   . Pericarditis 01/1999  . Pneumothorax 01/1999  . Thyroid disorder   . Vitamin B 12 deficiency    Past Surgical History:  Procedure Laterality Date  . birthmark      removed,left leg  .  CATARACT EXTRACTION  2017  . ORIF HUMERUS FRACTURE Right 04/10/2019   Procedure: RIGHT OPEN REDUCTION INTERNAL FIXATION (ORIF) DISTAL HUMERUS FRACTURE WITH EXTENSION;  Surgeon: Hiram Gash, MD;  Location: [facility];  Service: Orthopedics;  Laterality: Right;  . [place]?   Family History  Problem Relation Age of Onset  . Diabetes Mother   . Asthma Mother   . Ataxia Father   . Stroke Father   . Cancer - Cervical Sister   . Thyroid disease Sister   . Diabetes Other   . Diabetes Maternal Grandmother   . Arthritis Paternal Grandmother    . Colon cancer Neg Hx    Social History   Socioeconomic History  . Marital status: Divorced    Spouse name: Not on file  . Number of children: 1  . Years of education: college  . Highest education level: Not on file  Occupational History  . Occupation: retired  Tobacco Use  . Smoking status: Never Smoker  . Smokeless tobacco: Never Used  Vaping Use  . Vaping Use: Never used  Substance and Sexual Activity  . Alcohol use: Yes    Alcohol/week: 3.0 standard drinks    Types: 3 Glasses of wine per week    Comment: social  . Drug use: No  . Sexual activity: Not on file  Other Topics Concern  . Not on file  Social History Narrative   Moved to [facility] 01/14/2015   Do you drink/eat things with caffeine?  yes   Marital status?      divorced                               Do you live in a house, apartment, assisted living, condo, trailer, etc.?  Retirement community   Is it one or more stories? Yes, 3   How many persons live in your home?  1   Do you have any pets in your home? (please list) 0   Current or past profession:  Librarian   Do you exercise?     yes                                 Type & how often? Walk-varies   Do you have a living will? yes   Do you have a DNR form?         no                         If not, do you want to discuss one? yes   Do you have signed POA/HPOA for forms? yes   Social Determinants of Health   Financial Resource Strain:   . Difficulty of Paying Living Expenses:   Food Insecurity:   . Worried About Charity fundraiser in the Last Year:   . Arboriculturist in the Last Year:   Transportation Needs:   . Film/video editor (Medical):   Marland Kitchen Lack of Transportation (Non-Medical):   Physical Activity:   . Days of Exercise per Week:   . Minutes of Exercise per Session:   Stress:   . Feeling of Stress :   Social Connections:   . Frequency of Communication with Friends and Family:   . Frequency of Social Gatherings with Friends and  Family:   .  Attends Religious Services:   . Active Member of Clubs or Organizations:   . Attends Archivist Meetings:   Marland Kitchen Marital Status:     Tobacco Counseling Counseling given: Not Answered   Clinical Intake:  Pre-visit preparation completed: Yes  Pain : No/denies pain     BMI - recorded: 28 Nutritional Status: BMI 25 -29 Overweight Nutritional Risks: Unintentional weight gain (a spider bite medial right lower leg is healing) Diabetes: No  How often do you need to have someone help you when you read instructions, pamphlets, or other written materials from your doctor or pharmacy?: 1 - Never What is the last grade level you completed in school?: 3 master degrees  Diabetic? no  Interpreter Needed?: No  Information entered by :: Dhanvin Szeto Bretta Bang NP   Activities of Daily Living In your present state of health, do you have any difficulty performing the following activities: 09/17/2019 04/10/2019  Hearing? N N  Comment uses hearing aids -  Vision? N N  Difficulty concentrating or making decisions? N N  Comment occasionally memory lapses -  Walking or climbing stairs? N N  Dressing or bathing? N N  Doing errands, shopping? N -  Preparing Food and eating ? N -  Using the Toilet? N -  In the past six months, have you accidently leaked urine? Y -  Comment occasiobally, pads sometimes -  Do you have problems with loss of bowel control? N -  Managing your Medications? N -  Managing your Finances? N -  Housekeeping or managing your Housekeeping? N -  Some recent data might be hidden    Patient Care Team: Virgie Dad, MD as PCP - General (Internal Medicine) Ronald Lobo, MD as Consulting Physician (Gastroenterology) Katy Apo, MD as Consulting Physician (Ophthalmology) Martinique, Amy, MD as Consulting Physician (Dermatology) Adler Chartrand X, NP as Nurse Practitioner (Internal Medicine)  Indicate any recent Medical Services you may have received from other than  [facility] providers in the past year (date may be approximate).     Assessment:   This is a routine wellness examination for [facility]  Hearing/Vision screen No exam data present  Dietary issues and exercise activities discussed: Current Exercise Habits: Home exercise routine;Structured exercise class, Type of exercise: walking;yoga;stretching, Time (Minutes): 30, Frequency (Times/Week): 5, Weekly Exercise (Minutes/Week): 150, Intensity: Moderate, Exercise limited by: None identified  Goals    . Patient Stated     The patient will maintain and improve current level of wellness.     . Weight (lb) < 145 lb (65.8 kg)     Pt would like to exercise and work on portion control      Depression Screen PHQ 2/9 Scores 09/17/2019 11/20/2017 03/29/2017 11/23/2015 11/18/2015 11/02/2015 01/05/2015  PHQ - 2 Score 0 0 0 0 0 0 0  PHQ- 9 Score 6 - - - - - -    Fall Risk Fall Risk  09/17/2019 09/17/2019 08/28/2019 07/31/2019 05/29/2019  Falls in the past year? 1 0 0 0 1  Number falls in past yr: 0 0 0 0 0  Injury with Fall? 0 - - - 1  Comment - - - - -    Any stairs in or around the home? Yes  If so, are there any without handrails? Yes  Home free of loose throw rugs in walkways, pet beds, electrical cords, etc? Yes  Adequate lighting in your home to reduce risk of falls? Yes   ASSISTIVE DEVICES UTILIZED TO PREVENT FALLS:  Life alert? No  Use of a cane, walker or w/c? no Grab bars in the bathroom? Yes  Shower chair or bench in shower? Yes  Elevated toilet seat or a handicapped toilet? Yes   TIMED UP AND GO:  Was the test performed? NA.  Length of time to ambulate 10 feet: NA   Gait steady and fast without use of assistive device normal  Cognitive Function: MMSE - Mini Mental State Exam 09/17/2019 11/20/2017 03/29/2017  Orientation to time 5 4 5   Orientation to Place 5 5 5   Registration 3 3 3   Attention/ Calculation 0 5 5  Recall 3 3 3   Language- name 2 objects 2 2 2   Language- repeat 1 1 1    Language- follow 3 step command 3 3 3   Language- read & follow direction 1 1 1   Write a sentence 1 1 1   Copy design 1 1 1   Total score 25 29 30         Immunizations Immunization History  Administered Date(s) Administered  . Influenza Whole 12/29/2006  . Influenza, High Dose Seasonal PF 11/21/2016, 11/20/2017, 11/01/2018  . Influenza,inj,Quad PF,6+ Mos 12/04/2013, 11/12/2014, 11/18/2015  . Influenza-Unspecified 12/02/2011, 11/15/2012, 12/02/2016  . Moderna SARS-COVID-2 Vaccination 03/11/2019, 04/08/2019  . PPD Test 01/05/2015  . Pneumococcal Conjugate-13 08/19/2014  . Pneumococcal Polysaccharide-23 11/18/2015  . Td 06/02/2008  . Tdap 05/31/2018  . Zoster 03/08/2011  . Zoster Recombinat (Shingrix) 01/06/2016, 05/02/2016    TDAP status: Due, Education has been provided regarding the importance of this vaccine. Advised may receive this vaccine at local pharmacy or Health Dept. Aware to provide a copy of the vaccination record if obtained from local pharmacy or Health Dept. Verbalized acceptance and understanding. Flu Vaccine status: Up to date Pneumococcal vaccine status: Up to date Covid-19 vaccine status: Completed vaccines  Qualifies for Shingles Vaccine? done Zostavax completed Yes   Shingrix Completed?: Yes up to date  Screening Tests Health Maintenance  Topic Date Due  . Hepatitis C Screening  Never done  . INFLUENZA VACCINE  10/06/2019  . COLONOSCOPY  07/11/2020  . TETANUS/TDAP  05/30/2028  . DEXA SCAN  Completed  . COVID-19 Vaccine  Completed  . PNA vac Low Risk Adult  Completed    Health Maintenance  Health Maintenance Due  Topic Date Due  . Hepatitis C Screening  Never done    Colorectal cancer screening: No longer required.  Mammogram status: No longer required.  Bone Density status: Ordered delay. Pt provided with contact info and advised to call to schedule appt.  Lung Cancer Screening: (Low Dose CT Chest recommended if Age 40-80 years, 30  pack-year currently smoking OR have quit w/in 15years.) does not qualify.   Lung Cancer Screening Referral: none  Additional Screening:  Hepatitis C Screening: does not qualify  Vision Screening: Recommended annual ophthalmology exams for early detection of glaucoma and other disorders of the eye. Is the patient up to date with their annual eye exam?  Yes  Who is the provider or what is the name of the office in which the patient attends annual eye exams? Dr. Prudencio Burly If pt is not established with a provider, would they like to be referred to a provider to establish care? No .   Dental Screening: Recommended annual dental exams for proper oral hygiene  Community Resource Referral / Chronic Care Management: CRR required this visit?  No   CCM required this visit?  No      Plan:     I  have personally reviewed and noted the following in the patient's chart:   . Medical and social history . Use of alcohol, tobacco or illicit drugs  . Current medications and supplements . Functional ability and status . Nutritional status . Physical activity . Advanced directives . List of other physicians . Hospitalizations, surgeries, and ER visits in previous 12 months . Vitals . Screenings to include cognitive, depression, and falls . Referrals and appointments  In addition, I have reviewed and discussed with patient certain preventive protocols, quality metrics, and best practice recommendations. A written personalized care plan for preventive services as well as general preventive health recommendations were provided to patient.     Danaye Sobh X Kamrie Fanton, NP   09/17/2019   Nurse Notes: NA

## 2019-10-15 ENCOUNTER — Non-Acute Institutional Stay: Payer: Medicare Other | Admitting: Nurse Practitioner

## 2019-10-15 ENCOUNTER — Encounter: Payer: Self-pay | Admitting: Nurse Practitioner

## 2019-10-15 ENCOUNTER — Other Ambulatory Visit: Payer: Self-pay

## 2019-10-15 VITALS — BP 128/80 | HR 64 | Temp 97.4°F | Ht 62.5 in | Wt 154.7 lb

## 2019-10-15 DIAGNOSIS — E611 Iron deficiency: Secondary | ICD-10-CM | POA: Diagnosis not present

## 2019-10-15 DIAGNOSIS — T63301A Toxic effect of unspecified spider venom, accidental (unintentional), initial encounter: Secondary | ICD-10-CM | POA: Diagnosis not present

## 2019-10-15 DIAGNOSIS — E039 Hypothyroidism, unspecified: Secondary | ICD-10-CM | POA: Diagnosis not present

## 2019-10-15 DIAGNOSIS — M810 Age-related osteoporosis without current pathological fracture: Secondary | ICD-10-CM | POA: Diagnosis not present

## 2019-10-15 MED ORDER — DOXYCYCLINE HYCLATE 100 MG PO TABS: 100.0000 mg | ORAL_TABLET | Freq: Two times a day (BID) | ORAL | 0 refills | Status: AC

## 2019-10-15 NOTE — Assessment & Plan Note (Signed)
Stable, Hgb 13.7 07/18/19, continue Fe

## 2019-10-15 NOTE — Progress Notes (Signed)
Location:   clinic [place] of Service:  Clinic (12) Provider: Marlana Latus NP  Code Status: DNR Goals of Care: [place] Advanced [place]ives 04/10/2019  Does Patient Have a Medical Advance Directive? Yes  Type of Advance Directive Living will  Does patient want to make changes to medical advance directive? No - Patient declined  Copy of [place] in Chart? -  Pre-existing out of facility DNR order (yellow form or pink MOST form) -     Chief Complaint  Patient presents with  . Acute Visit    Patient requesting antibiotic for spider bite.    HPI: Patient is a 79 y.o. female seen today for spider bite wounds on the right knee, medial left upper calf, blistering, raised redness/mild warmth. Hx of infected spider bites.   Hypothyroidism, takes Levothyroxine, TSH 2.41 03/14/19  Anemia, takes Fe, Hgb 13.7 07/18/19  OP takes Alendronate, Ca, Vit D   Past Medical History:  Diagnosis Date  . Anemia 12/2015  . Bronchitis, chronic ([place])    in her 8s  . Cataract   . Chronic kidney disease    in 5s had nephritis  . Closed fracture of right distal humerus    right  . Hearing loss 05/03/2016  . High cholesterol   . Hypothyroidism   . Osteopenia   . Pericarditis 01/1999  . Pneumothorax 01/1999  . Thyroid disorder   . Vitamin B 12 deficiency     Past Surgical History:  Procedure Laterality Date  . birthmark      removed,left leg  . CATARACT EXTRACTION  2017  . ORIF HUMERUS FRACTURE Right 04/10/2019   Procedure: RIGHT OPEN REDUCTION INTERNAL FIXATION (ORIF) DISTAL HUMERUS FRACTURE WITH EXTENSION;  Surgeon: Hiram Gash, MD;  Location: [facility];  Service: Orthopedics;  Laterality: Right;  . [place]?    Allergies  Allergen Reactions  . Atorvastatin     Muscle weakness  . Rosuvastatin     Muscle weaknes    Allergies as of 10/15/2019      Reactions   Atorvastatin    Muscle weakness   Rosuvastatin    Muscle  weaknes      Medication List       Accurate as of October 15, 2019 11:59 PM. If you have any questions, ask your nurse or doctor.        alendronate 70 MG tablet Commonly known as: FOSAMAX Take one tablet by mouth every 7 days. Take with a full glass of water on an empty stomach.   calcium carbonate 1250 (500 Ca) MG chewable tablet Commonly known as: OS-CAL Chew 1 tablet (1,250 mg total) by mouth daily. What changed: additional instructions   celecoxib 100 MG capsule Commonly known as: CELEBREX Take 100 mg by mouth as needed.   cholecalciferol 1000 units tablet Commonly known as: VITAMIN D Take 2,000 Units by mouth daily.   clobetasol ointment 0.05 % Commonly known as: TEMOVATE Apply 1 application topically daily.   doxycycline 100 MG tablet Commonly known as: VIBRA-TABS Take 1 tablet (100 mg total) by mouth 2 (two) times daily for 7 days. Started by: Denishia Citro X Sophie Quiles, NP   FERROUS SULFATE PO Take 1 tablet by mouth daily.   l-methylfolate-B6-B12 3-35-2 MG Tabs tablet Commonly known as: METANX Take 1 tablet by mouth daily.   levothyroxine 125 MCG tablet Commonly known as: SYNTHROID Take 1 tablet (125 mcg total) by mouth daily.   multivitamin  with minerals tablet Take 1 tablet by mouth daily.   Red Yeast Rice 600 MG Tabs Take 1 tablet by mouth daily.   vitamin C 500 MG tablet Commonly known as: ASCORBIC ACID Take 500 mg by mouth daily.       Review of Systems:  Review of Systems  Constitutional: Negative for activity change, appetite change, chills, diaphoresis and fever.  HENT: Positive for hearing loss. Negative for congestion.   Eyes: Negative for visual disturbance.  Respiratory: Negative for cough and shortness of breath.   Cardiovascular: Negative for chest pain, palpitations and leg swelling.  Gastrointestinal: Negative for abdominal pain, constipation, nausea and vomiting.  Genitourinary: Negative for difficulty urinating, dysuria, frequency and  urgency.  Musculoskeletal: Positive for gait problem. Negative for myalgias.  Skin:       Spider bites  Neurological: Negative for dizziness, speech difficulty, weakness and headaches.       Feels weaker in legs towards end of day, evening, night.   Psychiatric/Behavioral: Negative for agitation, dysphoric mood and sleep disturbance. The patient is not nervous/anxious.     Health Maintenance  Topic Date Due  . Hepatitis C Screening  Never done  . INFLUENZA VACCINE  10/06/2019  . COLONOSCOPY  07/11/2020  . TETANUS/TDAP  05/30/2028  . DEXA SCAN  Completed  . COVID-19 Vaccine  Completed  . PNA vac Low Risk Adult  Completed    Physical Exam: Vitals:   10/15/19 1435  BP: 128/80  Pulse: 64  Temp: (!) 97.4 F (36.3 C)  SpO2: 96%  Weight: 154 lb 11.2 oz (70.2 kg)  Height: 5' 2.5" (1.588 m)   Body mass index is 27.84 kg/m. Physical Exam Vitals reviewed.  Constitutional:      General: She is not in acute distress.    Appearance: Normal appearance. She is not ill-appearing, toxic-appearing or diaphoretic.  HENT:     Head: Normocephalic and atraumatic.     Nose: Nose normal.     Mouth/Throat:     Mouth: Mucous membranes are moist.  Eyes:     Extraocular Movements: Extraocular movements intact.     Conjunctiva/sclera: Conjunctivae normal.     Pupils: Pupils are equal, round, and reactive to light.  Cardiovascular:     Rate and Rhythm: Normal rate and regular rhythm.     Heart sounds: No murmur heard.   Pulmonary:     Breath sounds: Normal breath sounds. No wheezing, rhonchi or rales.  Abdominal:     General: Bowel sounds are normal.     Palpations: Abdomen is soft.     Tenderness: There is no abdominal tenderness. There is no guarding or rebound.  Musculoskeletal:     Cervical back: Normal range of motion and neck supple.     Right lower leg: No edema.     Left lower leg: No edema.  Skin:    General: Skin is warm and dry.     Comments: Yellow fluid filled blisters  right knee, medial upper left calf with redness, mild raised spider bites based wounds, mild discomfort  Neurological:     General: No focal deficit present.     Mental Status: She is alert and oriented to person, place, and time. Mental status is at baseline.     Motor: No weakness.     Coordination: Coordination normal.     Gait: Gait normal.  Psychiatric:        Mood and Affect: Mood normal.        Behavior:  Behavior normal.        Thought Content: Thought content normal.        Judgment: Judgment normal.     Labs reviewed: Basic Metabolic Panel: Recent Labs    12/13/18 0000 03/14/19 0810 07/18/19 0750  NA  --  139 139  K  --  4.4 4.4  CL  --  104 105  CO2  --  23 28  GLUCOSE  --  99 91  BUN  --  15 15  CREATININE  --  0.59* 0.60  CALCIUM  --  9.0 8.7  TSH 0.88 2.41  --    Liver Function Tests: Recent Labs    03/14/19 0810 07/18/19 0750  AST 19 13  ALT 17 13  BILITOT 0.5 0.5  PROT 6.3 6.2   No results for input(s): LIPASE, AMYLASE in the last 8760 hours. No results for input(s): AMMONIA in the last 8760 hours. CBC: Recent Labs    03/14/19 0810 07/18/19 0750  WBC 8.5 7.6  NEUTROABS 5,007 4,294  HGB 14.1 13.7  HCT 40.9 40.4  MCV 95.8 96.4  PLT 296 263   Lipid Panel: Recent Labs    03/14/19 0810 07/18/19 0750  CHOL 252* 233*  HDL 52 58  LDLCALC 176* 156*  TRIG 111 89  CHOLHDL 4.8 4.0   No results found for: HGBA1C  Procedures since last visit: No results found.  Assessment/Plan  Spider bite allergy, current reaction spider bite wounds on the right knee, medial left upper calf, blistering, raised redness/mild warmth. Hx of infected spider bites. Will OTC hydrocortisone cream, Claritin, Benadryl cream prn. Will treat with Doxycycline 100mg  bid x 7 days per patient's request given hx of infected spider bite wounds.   Iron deficiency Stable, Hgb 13.7 07/18/19, continue Fe  Hypothyroidism Stable, continue Levothyroxine, TSH 2.41  03/14/19  Senile osteoporosis No recent fxs, continue Alendronate, Ca, Vit D   Labs/tests ordered: none  Next appt:  01/23/2020

## 2019-10-15 NOTE — Assessment & Plan Note (Signed)
No recent fxs, continue Alendronate, Ca, Vit D

## 2019-10-15 NOTE — Assessment & Plan Note (Signed)
Stable, continue Levothyroxine, TSH 2.41 03/14/19

## 2019-10-15 NOTE — Assessment & Plan Note (Signed)
spider bite wounds on the right knee, medial left upper calf, blistering, raised redness/mild warmth. Hx of infected spider bites. Will OTC hydrocortisone cream, Claritin, Benadryl cream prn. Will treat with Doxycycline 100mg  bid x 7 days per patient's request given hx of infected spider bite wounds.

## 2019-10-16 ENCOUNTER — Encounter: Payer: Self-pay | Admitting: Nurse Practitioner

## 2019-12-12 ENCOUNTER — Encounter: Payer: Self-pay | Admitting: Internal Medicine

## 2019-12-12 NOTE — Telephone Encounter (Signed)
Pended Rx and sent to Dr. Gupta for approval.  

## 2019-12-13 MED ORDER — L-METHYLFOLATE-B6-B12 3-35-2 MG PO TABS: 1.0000 | ORAL_TABLET | Freq: Two times a day (BID) | ORAL | 1 refills | Status: DC

## 2019-12-21 ENCOUNTER — Encounter: Payer: Self-pay | Admitting: Internal Medicine

## 2019-12-24 ENCOUNTER — Telehealth: Payer: Self-pay | Admitting: Internal Medicine

## 2019-12-24 NOTE — Telephone Encounter (Signed)
Discussed with the patient.

## 2019-12-24 NOTE — Telephone Encounter (Signed)
Let her know I am talking to Pharmacy in [place] to see if she can get her Levan Hurst shot here in [facility] [place] before her travels. AS soon as I hear something I will let her know

## 2019-12-24 NOTE — Telephone Encounter (Signed)
Patient wants Gupta's permission for the moderna booster shot, she is wanting to travel and she just wants to know if Lyndel Safe is comfortable with her getting the booster vaccination for moderna and she is asking where and how she can get it. Can somebody please give her a call today she as been waiting for somebody to call her.   [phone] (Cell)

## 2019-12-25 ENCOUNTER — Telehealth: Payer: Self-pay | Admitting: Internal Medicine

## 2019-12-25 NOTE — Telephone Encounter (Signed)
Patient states that she received a phone call from Dr. Lyndel Safe around 10am this morning in reference to covid booster.  She states that she is driving now and should be home in about 20 minutes and would like for Dr. Lyndel Safe to call her back.  Thank you  Adan Sis

## 2019-12-27 NOTE — Telephone Encounter (Signed)
Patient and Dr. Lyndel Safe discussed topic on Wednesday.

## 2020-01-23 ENCOUNTER — Other Ambulatory Visit: Payer: Self-pay

## 2020-01-23 DIAGNOSIS — E782 Mixed hyperlipidemia: Secondary | ICD-10-CM

## 2020-01-23 DIAGNOSIS — E538 Deficiency of other specified B group vitamins: Secondary | ICD-10-CM

## 2020-01-23 DIAGNOSIS — I872 Venous insufficiency (chronic) (peripheral): Secondary | ICD-10-CM

## 2020-01-23 DIAGNOSIS — D509 Iron deficiency anemia, unspecified: Secondary | ICD-10-CM

## 2020-01-24 LAB — CBC WITH DIFFERENTIAL/PLATELET
Absolute Monocytes: 606 cells/uL (ref 200–950)
Basophils Absolute: 58 cells/uL (ref 0–200)
Basophils Relative: 0.7 %
Eosinophils Absolute: 199 cells/uL (ref 15–500)
Eosinophils Relative: 2.4 %
HCT: 40.3 % (ref 35.0–45.0)
Hemoglobin: 13.8 g/dL (ref 11.7–15.5)
Lymphs Abs: 2556 cells/uL (ref 850–3900)
MCH: 32.9 pg (ref 27.0–33.0)
MCHC: 34.2 g/dL (ref 32.0–36.0)
MCV: 96.2 fL (ref 80.0–100.0)
MPV: 11.2 fL (ref 7.5–12.5)
Monocytes Relative: 7.3 %
Neutro Abs: 4880 cells/uL (ref 1500–7800)
Neutrophils Relative %: 58.8 %
Platelets: 299 10*3/uL (ref 140–400)
RBC: 4.19 10*6/uL (ref 3.80–5.10)
RDW: 12.4 % (ref 11.0–15.0)
Total Lymphocyte: 30.8 %
WBC: 8.3 10*3/uL (ref 3.8–10.8)

## 2020-01-24 LAB — COMPLETE METABOLIC PANEL WITH GFR
AG Ratio: 2 (calc) (ref 1.0–2.5)
ALT: 13 U/L (ref 6–29)
AST: 13 U/L (ref 10–35)
Albumin: 4 g/dL (ref 3.6–5.1)
Alkaline phosphatase (APISO): 53 U/L (ref 37–153)
BUN/Creatinine Ratio: 31 (calc) — ABNORMAL HIGH (ref 6–22)
BUN: 18 mg/dL (ref 7–25)
CO2: 29 mmol/L (ref 20–32)
Calcium: 9.2 mg/dL (ref 8.6–10.4)
Chloride: 104 mmol/L (ref 98–110)
Creat: 0.58 mg/dL — ABNORMAL LOW (ref 0.60–0.93)
GFR, Est African American: 102 mL/min/{1.73_m2} (ref >=60)
GFR, Est Non African American: 88 mL/min/{1.73_m2} (ref >=60)
Globulin: 2 g/dL (calc) (ref 1.9–3.7)
Glucose, Bld: 89 mg/dL (ref 65–99)
Potassium: 4.3 mmol/L (ref 3.5–5.3)
Sodium: 139 mmol/L (ref 135–146)
Total Bilirubin: 0.7 mg/dL (ref 0.2–1.2)
Total Protein: 6 g/dL — ABNORMAL LOW (ref 6.1–8.1)

## 2020-01-24 LAB — LIPID PANEL
Cholesterol: 238 mg/dL — ABNORMAL HIGH (ref <200)
HDL: 53 mg/dL (ref >=50)
LDL Cholesterol (Calc): 158 mg/dL (calc) — ABNORMAL HIGH
Non-HDL Cholesterol (Calc): 185 mg/dL (calc) — ABNORMAL HIGH (ref <130)
Total CHOL/HDL Ratio: 4.5 (calc) (ref <5.0)
Triglycerides: 141 mg/dL (ref <150)

## 2020-01-24 LAB — TSH: TSH: 2.61 mIU/L (ref 0.40–4.50)

## 2020-01-24 LAB — VITAMIN B12: Vitamin B-12: >2000 pg/mL — ABNORMAL HIGH (ref 200–1100)

## 2020-01-29 ENCOUNTER — Non-Acute Institutional Stay: Payer: Medicare Other | Admitting: Internal Medicine

## 2020-01-29 ENCOUNTER — Other Ambulatory Visit: Payer: Self-pay

## 2020-01-29 ENCOUNTER — Encounter: Payer: Self-pay | Admitting: Internal Medicine

## 2020-01-29 VITALS — BP 110/80 | HR 66 | Temp 97.8°F | Ht 62.5 in | Wt 152.8 lb

## 2020-01-29 DIAGNOSIS — M810 Age-related osteoporosis without current pathological fracture: Secondary | ICD-10-CM

## 2020-01-29 DIAGNOSIS — E782 Mixed hyperlipidemia: Secondary | ICD-10-CM | POA: Diagnosis not present

## 2020-01-29 DIAGNOSIS — R531 Weakness: Secondary | ICD-10-CM | POA: Diagnosis not present

## 2020-01-29 DIAGNOSIS — G6289 Other specified polyneuropathies: Secondary | ICD-10-CM

## 2020-01-29 DIAGNOSIS — E039 Hypothyroidism, unspecified: Secondary | ICD-10-CM | POA: Diagnosis not present

## 2020-01-29 DIAGNOSIS — W19XXXA Unspecified fall, initial encounter: Secondary | ICD-10-CM

## 2020-01-29 NOTE — Progress Notes (Signed)
Location:      Place of Service:     Provider:   Code Status:  Goals of Care:  Advanced Directives 01/29/2020  Does Patient Have a Medical Advance Directive? Yes  Type of Advance Directive [place]  Does patient want to make changes to medical advance directive? No - Patient declined  Copy of [place] in Chart? Yes - validated most recent copy scanned in chart (See row information)  Pre-existing out of facility DNR order (yellow form or pink MOST form) -     Chief Complaint  Patient presents with  . Medical Management of Chronic Issues    6 month follow up. Patient states she had a fall about 1 month ago,but no injuries.  Patient would like to discuss sudden weakness and loss of balance. She would also like to discuss COVID testing before she travels.    HPI: Patient is a 79 y.o. female seen today for medical management of chronic diseases.    Patient has a history of distal humerus fracture Doing well.  Still has some lingering pain and some weakness in right hand History of umbilical hernia  History of osteoporosis on Fosamax Hyperlipidemia, hypothyroidism, history of peripheral neuropathy history of shortness of breath with exertion  Patient had an episode few weeks ago when she drove to a retreat.  She states when she got to her hotel place.  She was unable to walk and needed help from other  people.  Was unable to climb up the steps.  She thought she was deconditioned and dehydrated.  Did not go to the hospital.  Stayed in her room.  The symptoms did go away after a few days.  Denies any dizziness/vertigo/nausea vomiting,  she states that have not recurred since then.   She at baseline walks without any assist.  She did have a mechanical fall last week with no injury when she was walking outside at night  No other complaints today.  Planning to go and visit her son who lives in [place]. She lives in apartment by herself.  Still  drives. Past Medical History:  Diagnosis Date  . Anemia 12/2015  . Bronchitis, chronic ([place])    in her 3s  . Cataract   . Chronic kidney disease    in 22s had nephritis  . Closed fracture of right distal humerus    right  . Hearing loss 05/03/2016  . High cholesterol   . Hypothyroidism   . Osteopenia   . Pericarditis 01/1999  . Pneumothorax 01/1999  . Thyroid disorder   . Vitamin B 12 deficiency     Past Surgical History:  Procedure Laterality Date  . birthmark      removed,left leg  . CATARACT EXTRACTION  2017  . ORIF HUMERUS FRACTURE Right 04/10/2019   Procedure: RIGHT OPEN REDUCTION INTERNAL FIXATION (ORIF) DISTAL HUMERUS FRACTURE WITH EXTENSION;  Surgeon: Hiram Gash, MD;  Location: [facility];  Service: Orthopedics;  Laterality: Right;  . [place]?    Allergies  Allergen Reactions  . Atorvastatin     Muscle weakness  . Rosuvastatin     Muscle weaknes    Outpatient Encounter Medications as of 01/29/2020  Medication Sig  . alendronate (FOSAMAX) 70 MG tablet Take one tablet by mouth every 7 days. Take with a full glass of water on an empty stomach.  . calcium carbonate (OS-CAL) 1250 (500 Ca) MG chewable tablet Chew 1 tablet (  1,250 mg total) by mouth daily. (Patient taking differently: Chew 1 tablet by mouth daily. Not a chewable)  . cholecalciferol (VITAMIN D) 1000 UNITS tablet Take 2,000 Units by mouth daily.   Marland Kitchen FERROUS SULFATE PO Take 1 tablet by mouth daily.  Marland Kitchen l-methylfolate-B6-B12 (METANX) 3-35-2 MG TABS tablet Take 1 tablet by mouth 2 (two) times daily. (Patient taking differently: Take 1 tablet by mouth daily. )  . levothyroxine (SYNTHROID) 125 MCG tablet Take 1 tablet (125 mcg total) by mouth daily.  . Multiple Vitamins-Minerals (MULTIVITAMIN WITH MINERALS) tablet Take 1 tablet by mouth daily.  . Red Yeast Rice 600 MG TABS Take 1 tablet by mouth daily.   . vitamin C (ASCORBIC ACID) 500 MG tablet Take 500 mg by  mouth daily.  . [DISCONTINUED] celecoxib (CELEBREX) 100 MG capsule Take 100 mg by mouth as needed.  . [DISCONTINUED] clobetasol ointment (TEMOVATE) 1.69 % Apply 1 application topically daily.    No facility-administered encounter medications on file as of 01/29/2020.    Review of Systems:  Review of Systems  Constitutional: Negative.   HENT: Negative.   Respiratory: Positive for shortness of breath.   Cardiovascular: Negative.   Gastrointestinal: Negative.   Genitourinary: Negative.   Musculoskeletal: Negative.   Skin: Negative.   Neurological: Positive for weakness.  Psychiatric/Behavioral: Positive for confusion.    Health Maintenance  Topic Date Due  . Hepatitis C Screening  Never done  . COLONOSCOPY  07/11/2020  . TETANUS/TDAP  05/30/2028  . INFLUENZA VACCINE  Completed  . DEXA SCAN  Completed  . COVID-19 Vaccine  Completed  . PNA vac Low Risk Adult  Completed    Physical Exam: Vitals:   01/29/20 1344  BP: 110/80  Pulse: 66  Temp: (!) 90.8 F (32.7 C)  SpO2: 99%  Weight: 152 lb 12.8 oz (69.3 kg)  Height: 5' 2.5" (1.588 m)   Body mass index is 27.5 kg/m. Physical Exam  Constitutional: Oriented to person, place, and time. Well-developed and well-nourished.  HENT:  Head: Normocephalic.  Mouth/Throat: Oropharynx is clear and moist.  Eyes: Pupils are equal, round, and reactive to light.  Neck: Neck supple.  Cardiovascular: Normal rate and normal heart sounds.  No murmur heard. Pulmonary/Chest: Effort normal and breath sounds normal. No respiratory distress. No wheezes. She has no rales.  Abdominal: Soft. Bowel sounds are normal. No distension. There is no tenderness. There is no rebound.  Musculoskeletal: No edema.  Lymphadenopathy: none Neurological: Alert and oriented to person, place, and time.  Did Detail Neuro Exam. No Deficits. Gait stable. Romberg negative. Finger nose Normal. No Rigidity. Does have mild resting tremor both hands. No Nystagmus Skin:  Skin is warm and dry.  Psychiatric: Normal mood and affect. Behavior is normal. Thought content normal.    Labs reviewed: Basic Metabolic Panel: Recent Labs    03/14/19 0810 07/18/19 0750 01/23/20 0815  NA 139 139 139  K 4.4 4.4 4.3  CL 104 105 104  CO2 23 28 29   GLUCOSE 99 91 89  BUN 15 15 18   CREATININE 0.59* 0.60 0.58*  CALCIUM 9.0 8.7 9.2  TSH 2.41  --  2.61   Liver Function Tests: Recent Labs    03/14/19 0810 07/18/19 0750 01/23/20 0815  AST 19 13 13   ALT 17 13 13   BILITOT 0.5 0.5 0.7  PROT 6.3 6.2 6.0*   No results for input(s): LIPASE, AMYLASE in the last 8760 hours. No results for input(s): AMMONIA in the last 8760 hours. CBC:  Recent Labs    03/14/19 0810 07/18/19 0750 01/23/20 0815  WBC 8.5 7.6 8.3  NEUTROABS 5,007 4,294 4,880  HGB 14.1 13.7 13.8  HCT 40.9 40.4 40.3  MCV 95.8 96.4 96.2  PLT 296 263 299   Lipid Panel: Recent Labs    03/14/19 0810 07/18/19 0750 01/23/20 0815  CHOL 252* 233* 238*  HDL 52 58 53  LDLCALC 176* 156* 158*  TRIG 111 89 141  CHOLHDL 4.8 4.0 4.5   No results found for: HGBA1C  Procedures since last visit: No results found.  Assessment/Plan Weakness Patient did have one episode of feeling weak and unable to do anything for almost a week She is back to her baseline now Exam completely negative Discussed about Therapy eval She wants to wait.  If symptoms recur she will either go to Hospital or call our office.  Hypothyroidism, unspecified type TSH normal Senile osteoporosis On Fosmax Will call Imaging center to schedeule her DEXA  Mixed hyperlipidemia Has Dad with h/o CAD at age 93 No other risk factors She will like to wait for Statins Diet and exercise Fall, initial encounter Seems Mechanical No Injuries Mild Polyneuropathy Decrase the dose of Mentaix to QD Has seen Dr Krista Blue before  Cognitive impairnemnt Some difficulty in coming up with her words Sometimes forgets her way aound But is staying  independent for now Had MMSe by [facility] 87/30 Missed on calculations Recall was 3/3 Will doo MMSE next visit   Other stable issues  Diarrhea Is taking Metamucil higher dose States better Colonoscopy in 5/19 was negative Diarrhea seems functional will call GI if it does not improve Umbilical hernia without obstruction and without gangrene Saw Surgery They do not think that she has any issues with obstructions They want to do elective surgery she wants to wait  Edema of both lower extremities due to peripheral venous insufficiency Continue restricting salt and elevating legs Can use TED hoses  Iron deficiency anemia, unspecified iron deficiency anemia type Colonoscopy in 5/19 was negative Hgb Normal Can reduce the dose Vitamin B 12 deficiency Levels High Reduce Supplement to QD Continue Supplements 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

## 2020-02-24 ENCOUNTER — Encounter: Payer: Self-pay | Admitting: Internal Medicine

## 2020-03-03 ENCOUNTER — Other Ambulatory Visit: Payer: Self-pay | Admitting: Internal Medicine

## 2020-03-03 DIAGNOSIS — M810 Age-related osteoporosis without current pathological fracture: Secondary | ICD-10-CM

## 2020-03-13 DIAGNOSIS — M545 Low back pain, unspecified: Secondary | ICD-10-CM | POA: Diagnosis not present

## 2020-03-13 DIAGNOSIS — M25552 Pain in left hip: Secondary | ICD-10-CM | POA: Diagnosis not present

## 2020-03-17 DIAGNOSIS — M542 Cervicalgia: Secondary | ICD-10-CM | POA: Diagnosis not present

## 2020-03-17 DIAGNOSIS — M1909 Primary osteoarthritis, other specified site: Secondary | ICD-10-CM | POA: Diagnosis not present

## 2020-03-17 DIAGNOSIS — R2681 Unsteadiness on feet: Secondary | ICD-10-CM | POA: Diagnosis not present

## 2020-03-24 DIAGNOSIS — R2681 Unsteadiness on feet: Secondary | ICD-10-CM | POA: Diagnosis not present

## 2020-03-24 DIAGNOSIS — M542 Cervicalgia: Secondary | ICD-10-CM | POA: Diagnosis not present

## 2020-03-24 DIAGNOSIS — M1909 Primary osteoarthritis, other specified site: Secondary | ICD-10-CM | POA: Diagnosis not present

## 2020-03-26 DIAGNOSIS — R2681 Unsteadiness on feet: Secondary | ICD-10-CM | POA: Diagnosis not present

## 2020-03-26 DIAGNOSIS — M542 Cervicalgia: Secondary | ICD-10-CM | POA: Diagnosis not present

## 2020-03-26 DIAGNOSIS — M1909 Primary osteoarthritis, other specified site: Secondary | ICD-10-CM | POA: Diagnosis not present

## 2020-03-31 DIAGNOSIS — R2681 Unsteadiness on feet: Secondary | ICD-10-CM | POA: Diagnosis not present

## 2020-03-31 DIAGNOSIS — M1909 Primary osteoarthritis, other specified site: Secondary | ICD-10-CM | POA: Diagnosis not present

## 2020-03-31 DIAGNOSIS — M542 Cervicalgia: Secondary | ICD-10-CM | POA: Diagnosis not present

## 2020-04-01 ENCOUNTER — Other Ambulatory Visit: Payer: Self-pay | Admitting: Internal Medicine

## 2020-04-02 ENCOUNTER — Other Ambulatory Visit: Payer: Self-pay | Admitting: Internal Medicine

## 2020-04-02 DIAGNOSIS — M810 Age-related osteoporosis without current pathological fracture: Secondary | ICD-10-CM

## 2020-04-02 DIAGNOSIS — M1909 Primary osteoarthritis, other specified site: Secondary | ICD-10-CM | POA: Diagnosis not present

## 2020-04-02 DIAGNOSIS — R2681 Unsteadiness on feet: Secondary | ICD-10-CM | POA: Diagnosis not present

## 2020-04-02 DIAGNOSIS — M542 Cervicalgia: Secondary | ICD-10-CM | POA: Diagnosis not present

## 2020-04-07 DIAGNOSIS — M7632 Iliotibial band syndrome, left leg: Secondary | ICD-10-CM | POA: Diagnosis not present

## 2020-04-07 DIAGNOSIS — M25552 Pain in left hip: Secondary | ICD-10-CM | POA: Diagnosis not present

## 2020-04-07 DIAGNOSIS — M6281 Muscle weakness (generalized): Secondary | ICD-10-CM | POA: Diagnosis not present

## 2020-04-07 DIAGNOSIS — M7062 Trochanteric bursitis, left hip: Secondary | ICD-10-CM | POA: Diagnosis not present

## 2020-04-07 DIAGNOSIS — M5459 Other low back pain: Secondary | ICD-10-CM | POA: Diagnosis not present

## 2020-04-09 DIAGNOSIS — M5459 Other low back pain: Secondary | ICD-10-CM | POA: Diagnosis not present

## 2020-04-09 DIAGNOSIS — M7062 Trochanteric bursitis, left hip: Secondary | ICD-10-CM | POA: Diagnosis not present

## 2020-04-09 DIAGNOSIS — M6281 Muscle weakness (generalized): Secondary | ICD-10-CM | POA: Diagnosis not present

## 2020-04-09 DIAGNOSIS — M7632 Iliotibial band syndrome, left leg: Secondary | ICD-10-CM | POA: Diagnosis not present

## 2020-04-09 DIAGNOSIS — M25552 Pain in left hip: Secondary | ICD-10-CM | POA: Diagnosis not present

## 2020-04-14 DIAGNOSIS — M6281 Muscle weakness (generalized): Secondary | ICD-10-CM | POA: Diagnosis not present

## 2020-04-14 DIAGNOSIS — M7062 Trochanteric bursitis, left hip: Secondary | ICD-10-CM | POA: Diagnosis not present

## 2020-04-14 DIAGNOSIS — M25552 Pain in left hip: Secondary | ICD-10-CM | POA: Diagnosis not present

## 2020-04-14 DIAGNOSIS — M5459 Other low back pain: Secondary | ICD-10-CM | POA: Diagnosis not present

## 2020-04-14 DIAGNOSIS — M7632 Iliotibial band syndrome, left leg: Secondary | ICD-10-CM | POA: Diagnosis not present

## 2020-04-20 ENCOUNTER — Ambulatory Visit (INDEPENDENT_AMBULATORY_CARE_PROVIDER_SITE_OTHER): Payer: Medicare Other | Admitting: Family

## 2020-04-20 ENCOUNTER — Encounter: Payer: Self-pay | Admitting: Family

## 2020-04-20 ENCOUNTER — Other Ambulatory Visit: Payer: Self-pay

## 2020-04-20 VITALS — BP 98/68 | HR 65 | Temp 96.8°F | Resp 16 | Ht 62.5 in | Wt 156.8 lb

## 2020-04-20 DIAGNOSIS — R1084 Generalized abdominal pain: Secondary | ICD-10-CM

## 2020-04-20 DIAGNOSIS — R143 Flatulence: Secondary | ICD-10-CM

## 2020-04-20 MED ORDER — SIMETHICONE 80 MG PO CHEW: 80.0000 mg | CHEWABLE_TABLET | Freq: Four times a day (QID) | ORAL | 0 refills | Status: DC | PRN

## 2020-04-20 NOTE — Patient Instructions (Addendum)
-   Take Gas -X one by mouth every 6 hours daily as needed  - increase water intake to 6-8 glasses daily  - Notify provider if symptoms worsen or fail to improve or having any nausea or vomiting,or abdominal distension.

## 2020-04-20 NOTE — Progress Notes (Signed)
Provider: Moon Budde FNP-C  Virgie Dad, MD  Patient Care Team: Virgie Dad, MD as PCP - General (Internal Medicine) Ronald Lobo, MD as Consulting Physician (Gastroenterology) Katy Apo, MD as Consulting Physician (Ophthalmology) Martinique, Amy, MD as Consulting Physician (Dermatology) Mast, Man X, NP as Nurse Practitioner (Internal Medicine)  Extended Emergency Contact Information Primary Emergency Contact: Armentor,Nicholas Address: [address] [place] [address] [place] Phone: [phone] Mobile Phone: [phone] Relation: Son Secondary Emergency Contact: Snavely,Celia Address: [address]          [place], [place] [address] [place] Phone: [phone] Mobile Phone: [phone] Relation: Friend  Code Status:  Full Code  Goals of care: Advanced Directive information Advanced Directives 04/20/2020  Does Patient Have a Medical Advance Directive? Yes  Type of Advance Directive Living will;Healthcare Power of Attorney  Does patient want to make changes to medical advance directive? No - Patient declined  Copy of [place] in Chart? Yes - validated most recent copy scanned in chart (See row information)  Pre-existing out of facility DNR order (yellow form or pink MOST form) -     Chief Complaint  Patient presents with  . Acute Visit    Abdominal Pain.    HPI:  Pt is a 80 y.o. female seen today for an acute visit for evaluation of abdominal pain for 1-2 week.started on right side then this morning moved across the left upper abdomen.pain worst in the morning but once she moves around pain goes away.Pain described as sharp pains.Associated with increased gas.No nausea,vomiting,bloating,diarrhea or constipation.Bowels moved twice today.Increased her meat intake in her diet about a week ago.  Does not drink enough water.    Past Medical History:  Diagnosis Date  . Anemia  12/2015  . Bronchitis, chronic ([place])    in her 47s  . Cataract   . Chronic kidney disease    in 68s had nephritis  . Closed fracture of right distal humerus    right  . Hearing loss 05/03/2016  . High cholesterol   . Hypothyroidism   . Osteopenia   . Pericarditis 01/1999  . Pneumothorax 01/1999  . Thyroid disorder   . Vitamin B 12 deficiency    Past Surgical History:  Procedure Laterality Date  . birthmark      removed,left leg  . CATARACT EXTRACTION  2017  . ORIF HUMERUS FRACTURE Right 04/10/2019   Procedure: RIGHT OPEN REDUCTION INTERNAL FIXATION (ORIF) DISTAL HUMERUS FRACTURE WITH EXTENSION;  Surgeon: Hiram Gash, MD;  Location: [facility];  Service: Orthopedics;  Laterality: Right;  . [place]?    Allergies  Allergen Reactions  . Atorvastatin     Muscle weakness  . Rosuvastatin     Muscle weaknes    Outpatient Encounter Medications as of 04/20/2020  Medication Sig  . alendronate (FOSAMAX) 70 MG tablet Take one tablet by mouth every 7 days. Take with a full glass of water on an empty stomach.  . calcium carbonate (OS-CAL) 1250 (500 Ca) MG chewable tablet Chew 1 tablet (1,250 mg total) by mouth daily.  . cholecalciferol (VITAMIN D) 1000 UNITS tablet Take 2,000 Units by mouth daily.   Marland Kitchen FERROUS SULFATE PO Take 1 tablet by mouth daily.  Marland Kitchen l-methylfolate-B6-B12 (METANX) 3-35-2 MG TABS tablet Take 1 tablet by mouth daily.  Marland Kitchen levothyroxine (SYNTHROID) 125 MCG tablet TAKE 1 TABLET BY  MOUTH  DAILY  . Red Yeast Rice 600 MG TABS Take 1 tablet by mouth daily.   . vitamin C (ASCORBIC ACID) 500 MG tablet Take 500 mg by mouth daily.  . [DISCONTINUED] l-methylfolate-B6-B12 (METANX) 3-35-2 MG TABS tablet Take 1 tablet by mouth 2 (two) times daily.  . [DISCONTINUED] Multiple Vitamins-Minerals (MULTIVITAMIN WITH MINERALS) tablet Take 1 tablet by mouth daily.   No facility-administered encounter medications on file as of 04/20/2020.     Review of Systems  Respiratory: Negative for cough, chest tightness, shortness of breath and wheezing.   Cardiovascular: Negative for chest pain, palpitations and leg swelling.  Gastrointestinal: Positive for abdominal pain. Negative for abdominal distention, blood in stool, constipation, diarrhea, nausea and vomiting.       Increased flatulence   Genitourinary: Negative for difficulty urinating, dysuria, flank pain, frequency and urgency.  Musculoskeletal: Positive for arthralgias. Negative for back pain, gait problem and joint swelling.  Skin: Negative for color change, pallor and rash.  Neurological: Negative for dizziness, speech difficulty, weakness, light-headedness and headaches.    Immunization History  Administered Date(s) Administered  . Influenza Whole 12/29/2006  . Influenza, High Dose Seasonal PF 11/21/2016, 11/20/2017, 11/01/2018  . Influenza,inj,Quad PF,6+ Mos 12/04/2013, 11/12/2014, 11/18/2015  . Influenza-Unspecified 12/02/2011, 11/15/2012, 12/02/2016, 11/26/2019  . Moderna Sars-Covid-2 Vaccination 03/11/2019, 04/08/2019  . PPD Test 01/05/2015  . Pneumococcal Conjugate-13 08/19/2014  . Pneumococcal Polysaccharide-23 11/18/2015  . Td 06/02/2008  . Tdap 05/31/2018  . Zoster 03/08/2011  . Zoster Recombinat (Shingrix) 01/06/2016, 05/02/2016   Pertinent  Health Maintenance Due  Topic Date Due  . COLONOSCOPY (Pts 45-72yrs Insurance coverage will need to be confirmed)  07/11/2020  . INFLUENZA VACCINE  Completed  . DEXA SCAN  Completed  . PNA vac Low Risk Adult  Completed   Fall Risk  04/20/2020 01/29/2020 10/15/2019 09/17/2019 09/17/2019  Falls in the past year? 0 1 0 1 0  Number falls in past yr: 0 1 0 0 0  Injury with Fall? 0 0 - 0 -  Comment - - - - -   Functional Status Survey:    Vitals:   04/20/20 1420  BP: 98/68  Pulse: 65  Resp: 16  Temp: (!) 96.8 F (36 C)  SpO2: 95%  Weight: 156 lb 12.8 oz (71.1 kg)  Height: 5' 2.5" (1.588 m)   Body mass  index is 28.22 kg/m. Physical Exam Vitals reviewed.  Constitutional:      General: She is not in acute distress.    Appearance: She is overweight. She is not ill-appearing.  Cardiovascular:     Rate and Rhythm: Normal rate and regular rhythm.     Pulses: Normal pulses.     Heart sounds: Normal heart sounds. No murmur heard. No friction rub. No gallop.   Pulmonary:     Effort: Pulmonary effort is normal. No respiratory distress.     Breath sounds: Normal breath sounds. No wheezing, rhonchi or rales.  Chest:     Chest wall: No tenderness.  Abdominal:     General: There is no distension.     Palpations: Abdomen is soft. There is no mass.     Tenderness: There is no abdominal tenderness. There is no right CVA tenderness, left CVA tenderness, guarding or rebound. Negative signs include Murphy's sign.     Comments: Hyperactive bowel sounds x 4 quadrant  Neurological:     Mental Status: She is alert.     Labs reviewed: Recent Labs    07/18/19 0750  01/23/20 0815  NA 139 139  K 4.4 4.3  CL 105 104  CO2 28 29  GLUCOSE 91 89  BUN 15 18  CREATININE 0.60 0.58*  CALCIUM 8.7 9.2   Recent Labs    07/18/19 0750 01/23/20 0815  AST 13 13  ALT 13 13  BILITOT 0.5 0.7  PROT 6.2 6.0*   Recent Labs    07/18/19 0750 01/23/20 0815  WBC 7.6 8.3  NEUTROABS 4,294 4,880  HGB 13.7 13.8  HCT 40.4 40.3  MCV 96.4 96.2  PLT 263 299   Lab Results  Component Value Date   TSH 2.61 01/23/2020   No results found for: HGBA1C Lab Results  Component Value Date   CHOL 238 (H) 01/23/2020   HDL 53 01/23/2020   LDLCALC 158 (H) 01/23/2020   LDLDIRECT 135.4 12/26/2006   TRIG 141 01/23/2020   CHOLHDL 4.5 01/23/2020    Significant Diagnostic Results in last 30 days:  No results found.  Assessment/Plan  1. Generalized abdominal pain Abdomen exam non-distended or non-tender hyperactive bowel sounds x 4 quadrant. - LBM today x 2 -  declines KUB states will continue to monitor for now.   - Advised to Take Gas -X one by mouth every 6 hours daily as needed  - increase water intake to 6-8 glasses daily  - Notify provider if symptoms worsen or fail to improve or having any nausea or vomiting,or abdominal distension.  - simethicone (GAS-X) 80 MG chewable tablet; Chew 1 tablet (80 mg total) by mouth every 6 (six) hours as needed for flatulence.  Dispense: 30 tablet; Refill: 0  2. Flatulence Hyperactive bowel sounds x 4 quadrant. - No nausea or vomiting.Negative exam findings otherwise.suspect gast pain possible from her recent diet adjustment. - will continue to monitor and notify provider if symptoms worsen or fail to improve. - Advised to go to ED if she develops any nausea,vomitng,abdominal distension or symptoms worsen.  - simethicone (GAS-X) 80 MG chewable tablet; Chew 1 tablet (80 mg total) by mouth every 6 (six) hours as needed for flatulence.  Dispense: 30 tablet; Refill: 0  Family/ staff Communication: Reviewed plan of care with patient verbalized understanding.   Labs/tests ordered: None   Next Appointment: As needed if symptoms worsen or fail to improve.   Sandrea Hughs, NP

## 2020-06-30 DIAGNOSIS — H6123 Impacted cerumen, bilateral: Secondary | ICD-10-CM | POA: Diagnosis not present

## 2020-07-06 DIAGNOSIS — H52203 Unspecified astigmatism, bilateral: Secondary | ICD-10-CM | POA: Diagnosis not present

## 2020-07-06 DIAGNOSIS — H524 Presbyopia: Secondary | ICD-10-CM | POA: Diagnosis not present

## 2020-07-06 DIAGNOSIS — Z23 Encounter for immunization: Secondary | ICD-10-CM | POA: Diagnosis not present

## 2020-07-06 DIAGNOSIS — Z961 Presence of intraocular lens: Secondary | ICD-10-CM | POA: Diagnosis not present

## 2020-07-22 ENCOUNTER — Other Ambulatory Visit: Payer: Self-pay | Admitting: Internal Medicine

## 2020-07-22 DIAGNOSIS — E782 Mixed hyperlipidemia: Secondary | ICD-10-CM

## 2020-07-22 DIAGNOSIS — E611 Iron deficiency: Secondary | ICD-10-CM

## 2020-07-22 DIAGNOSIS — M810 Age-related osteoporosis without current pathological fracture: Secondary | ICD-10-CM

## 2020-07-22 DIAGNOSIS — E039 Hypothyroidism, unspecified: Secondary | ICD-10-CM

## 2020-07-23 ENCOUNTER — Other Ambulatory Visit: Payer: Self-pay

## 2020-07-23 ENCOUNTER — Other Ambulatory Visit: Payer: Medicare Other

## 2020-07-23 DIAGNOSIS — E611 Iron deficiency: Secondary | ICD-10-CM

## 2020-07-23 DIAGNOSIS — E039 Hypothyroidism, unspecified: Secondary | ICD-10-CM | POA: Diagnosis not present

## 2020-07-23 DIAGNOSIS — M810 Age-related osteoporosis without current pathological fracture: Secondary | ICD-10-CM | POA: Diagnosis not present

## 2020-07-23 DIAGNOSIS — E782 Mixed hyperlipidemia: Secondary | ICD-10-CM | POA: Diagnosis not present

## 2020-07-24 LAB — COMPLETE METABOLIC PANEL WITH GFR
AG Ratio: 2.1 (calc) (ref 1.0–2.5)
ALT: 13 U/L (ref 6–29)
AST: 14 U/L (ref 10–35)
Albumin: 4.2 g/dL (ref 3.6–5.1)
Alkaline phosphatase (APISO): 69 U/L (ref 37–153)
BUN/Creatinine Ratio: 33 (calc) — ABNORMAL HIGH (ref 6–22)
BUN: 18 mg/dL (ref 7–25)
CO2: 24 mmol/L (ref 20–32)
Calcium: 8.9 mg/dL (ref 8.6–10.4)
Chloride: 105 mmol/L (ref 98–110)
Creat: 0.55 mg/dL — ABNORMAL LOW (ref 0.60–0.93)
GFR, Est African American: 103 mL/min/{1.73_m2} (ref >=60)
GFR, Est Non African American: 89 mL/min/{1.73_m2} (ref >=60)
Globulin: 2 g/dL (calc) (ref 1.9–3.7)
Glucose, Bld: 82 mg/dL (ref 65–99)
Potassium: 4.3 mmol/L (ref 3.5–5.3)
Sodium: 140 mmol/L (ref 135–146)
Total Bilirubin: 0.5 mg/dL (ref 0.2–1.2)
Total Protein: 6.2 g/dL (ref 6.1–8.1)

## 2020-07-24 LAB — CBC WITH DIFFERENTIAL/PLATELET
Absolute Monocytes: 646 cells/uL (ref 200–950)
Basophils Absolute: 64 cells/uL (ref 0–200)
Basophils Relative: 0.7 %
Eosinophils Absolute: 155 cells/uL (ref 15–500)
Eosinophils Relative: 1.7 %
HCT: 40.4 % (ref 35.0–45.0)
Hemoglobin: 13.8 g/dL (ref 11.7–15.5)
Lymphs Abs: 2366 cells/uL (ref 850–3900)
MCH: 33.4 pg — ABNORMAL HIGH (ref 27.0–33.0)
MCHC: 34.2 g/dL (ref 32.0–36.0)
MCV: 97.8 fL (ref 80.0–100.0)
MPV: 10.7 fL (ref 7.5–12.5)
Monocytes Relative: 7.1 %
Neutro Abs: 5870 cells/uL (ref 1500–7800)
Neutrophils Relative %: 64.5 %
Platelets: 290 10*3/uL (ref 140–400)
RBC: 4.13 10*6/uL (ref 3.80–5.10)
RDW: 11.9 % (ref 11.0–15.0)
Total Lymphocyte: 26 %
WBC: 9.1 10*3/uL (ref 3.8–10.8)

## 2020-07-24 LAB — TSH: TSH: 1.08 mIU/L (ref 0.40–4.50)

## 2020-07-24 LAB — LIPID PANEL
Cholesterol: 267 mg/dL — ABNORMAL HIGH (ref <200)
HDL: 56 mg/dL (ref >=50)
LDL Cholesterol (Calc): 185 mg/dL (calc) — ABNORMAL HIGH
Non-HDL Cholesterol (Calc): 211 mg/dL (calc) — ABNORMAL HIGH (ref <130)
Total CHOL/HDL Ratio: 4.8 (calc) (ref <5.0)
Triglycerides: 126 mg/dL (ref <150)

## 2020-07-29 ENCOUNTER — Encounter: Payer: Self-pay | Admitting: Internal Medicine

## 2020-07-29 ENCOUNTER — Non-Acute Institutional Stay: Payer: Medicare Other | Admitting: Internal Medicine

## 2020-07-29 ENCOUNTER — Other Ambulatory Visit: Payer: Self-pay

## 2020-07-29 VITALS — BP 110/64 | HR 61 | Temp 96.1°F | Ht 62.5 in | Wt 155.6 lb

## 2020-07-29 DIAGNOSIS — K429 Umbilical hernia without obstruction or gangrene: Secondary | ICD-10-CM

## 2020-07-29 DIAGNOSIS — E538 Deficiency of other specified B group vitamins: Secondary | ICD-10-CM

## 2020-07-29 DIAGNOSIS — G6289 Other specified polyneuropathies: Secondary | ICD-10-CM | POA: Diagnosis not present

## 2020-07-29 DIAGNOSIS — M810 Age-related osteoporosis without current pathological fracture: Secondary | ICD-10-CM

## 2020-07-29 DIAGNOSIS — E782 Mixed hyperlipidemia: Secondary | ICD-10-CM

## 2020-07-29 DIAGNOSIS — T63301A Toxic effect of unspecified spider venom, accidental (unintentional), initial encounter: Secondary | ICD-10-CM | POA: Diagnosis not present

## 2020-07-29 DIAGNOSIS — W19XXXA Unspecified fall, initial encounter: Secondary | ICD-10-CM | POA: Diagnosis not present

## 2020-07-29 DIAGNOSIS — G3184 Mild cognitive impairment, so stated: Secondary | ICD-10-CM

## 2020-07-29 DIAGNOSIS — E611 Iron deficiency: Secondary | ICD-10-CM

## 2020-07-29 DIAGNOSIS — R531 Weakness: Secondary | ICD-10-CM

## 2020-07-29 DIAGNOSIS — E039 Hypothyroidism, unspecified: Secondary | ICD-10-CM

## 2020-07-29 NOTE — Progress Notes (Signed)
Location:  [place] of Service:  Clinic (12)  Provider:   Code Status:  Goals of Care:  Advanced Directives 04/20/2020  Does Patient Have a Medical Advance Directive? Yes  Type of Advance Directive Living will;Healthcare Power of Attorney  Does patient want to make changes to medical advance directive? No - Patient declined  Copy of [place] in Chart? Yes - validated most recent copy scanned in chart (See row information)  Pre-existing out of facility DNR order (yellow form or pink MOST form) -     Chief Complaint  Patient presents with  . Medical Management of Chronic Issues    Patient returns to the clinic for her 6 month follow up. She would like to discuss her lab results.     HPI: Patient is a 80 y.o. female seen today for medical management of chronic diseases.     Patient has a history of distal humerus fracture History of umbilical hernia Surgery said no issues History of osteoporosis on Fosamax Hyperlipidemia, hypothyroidism, history of peripheral neuropathy history of shortness of breath with exertion  Had another spider bite.Has been putting Clobetasol  and thinks it is better.  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 Anemia 12/2015  . Bronchitis, chronic ([place])    in her 47s  . Cataract   . Chronic kidney disease    in 25s had nephritis  . Closed fracture of right distal humerus    right  . Hearing loss 05/03/2016  . High cholesterol   . Hypothyroidism   . Osteopenia   .  Pericarditis 01/1999  . Pneumothorax 01/1999  . Thyroid disorder   . Vitamin B 12 deficiency     Past Surgical History:  Procedure Laterality Date  . birthmark      removed,left leg  . CATARACT EXTRACTION  2017  . ORIF HUMERUS FRACTURE Right 04/10/2019   Procedure: RIGHT OPEN REDUCTION INTERNAL FIXATION (ORIF) DISTAL HUMERUS FRACTURE WITH EXTENSION;  Surgeon: Hiram Gash, MD;  Location: [place];  Service: Orthopedics;  Laterality: Right;  . [place]?    Allergies  Allergen Reactions  . Atorvastatin     Muscle weakness  . Rosuvastatin     Muscle weaknes    Outpatient Encounter Medications as of 07/29/2020  Medication Sig  . alendronate (FOSAMAX) 70 MG tablet Take one tablet by mouth every 7 days. Take with a full glass of water on an empty stomach.  . calcium carbonate (OS-CAL) 1250 (500 Ca) MG chewable tablet Chew 1 tablet (1,250 mg total) by mouth daily.  . cholecalciferol (VITAMIN D) 1000 UNITS tablet Take 2,000 Units by mouth daily.   Marland Kitchen FERROUS SULFATE PO Take 1 tablet by mouth daily.  Marland Kitchen l-methylfolate-B6-B12 (METANX) 3-35-2 MG TABS tablet Take 1 tablet by mouth daily.  Marland Kitchen levothyroxine (SYNTHROID) 125 MCG tablet TAKE 1 TABLET BY MOUTH  DAILY  . Red Yeast Rice 600 MG TABS Take 1 tablet by mouth daily.   Marland Kitchen  simethicone (GAS-X) 80 MG chewable tablet Chew 1 tablet (80 mg total) by mouth every 6 (six) hours as needed for flatulence.  . vitamin C (ASCORBIC ACID) 500 MG tablet Take 500 mg by mouth daily.   No facility-administered encounter medications on file as of 07/29/2020.    Review of Systems:  Review of Systems Review of Systems  Constitutional: Negative for activity change, appetite change, chills, diaphoresis, fatigue and fever.  HENT: Negative for mouth sores, postnasal drip, rhinorrhea, sinus pain and sore throat.   Respiratory: Negative for apnea, cough, chest tightness, shortness of breath and wheezing.    Cardiovascular: Negative for chest pain, palpitations and leg swelling.  Gastrointestinal: Negative for abdominal distention, abdominal pain, constipation, diarrhea, nausea and vomiting.  Genitourinary: Negative for dysuria and frequency.  Musculoskeletal: Negative for arthralgias, joint swelling and myalgias.  Skin: Negative for rash. Positive for small Wound Neurological: Negative for dizziness, syncope, weakness, light-headedness and numbness.  Psychiatric/Behavioral: Negative for behavioral problems, confusion and sleep disturbance.    Health Maintenance  Topic Date Due  . Hepatitis C Screening  Never done  . COLONOSCOPY (Pts 45-64yrs Insurance coverage will need to be confirmed)  07/11/2020  . INFLUENZA VACCINE  10/05/2020  . TETANUS/TDAP  05/30/2028  . DEXA SCAN  Completed  . COVID-19 Vaccine  Completed  . PNA vac Low Risk Adult  Completed  . HPV VACCINES  Aged Out    Physical Exam: Vitals:   07/29/20 1339  BP: 110/64  Pulse: 61  Temp: (!) 96.1 F (35.6 C)  SpO2: 98%  Weight: 155 lb 9.6 oz (70.6 kg)  Height: 5' 2.5" (1.588 m)   Body mass index is 28.01 kg/m. Physical Exam  Constitutional: Oriented to person, place, and time. Well-developed and well-nourished.  HENT:  Head: Normocephalic.  Mouth/Throat: Oropharynx is clear and moist.  Eyes: Pupils are equal, round, and reactive to light.  Neck: Neck supple.  Cardiovascular: Normal rate and normal heart sounds.  No murmur heard. Pulmonary/Chest: Effort normal and breath sounds normal. No respiratory distress. No wheezes. She has no rales.  Abdominal: Soft. Bowel sounds are normal. No distension. There is no tenderness. There is no rebound.  Musculoskeletal: No edema.  Lymphadenopathy: none Neurological: Alert and oriented to person, place, and time.  Skin: Skin is warm and dry. Did not want me to see the rash due to spider bite. Says it is better now Psychiatric: Normal mood and affect. Behavior is normal.  Thought content normal.    MMSE - Mini Mental State Exam 09/17/2019 11/20/2017 03/29/2017  Orientation to time 5 4 5   Orientation to Place 5 5 5   Registration 3 3 3   Attention/ Calculation 0 5 5  Recall 3 3 3   Language- name 2 objects 2 2 2   Language- repeat 1 1 1   Language- follow 3 step command 3 3 3   Language- read & follow direction 1 1 1   Write a sentence 1 1 1   Copy design 1 1 1   Total score 25 29 30     Labs reviewed: Basic Metabolic Panel: Recent Labs    01/23/20 0815 07/23/20 0815  NA 139 140  K 4.3 4.3  CL 104 105  CO2 29 24  GLUCOSE 89 82  BUN 18 18  CREATININE 0.58* 0.55*  CALCIUM 9.2 8.9  TSH 2.61 1.08   Liver Function Tests: Recent Labs    01/23/20 0815 07/23/20 0815  AST 13 14  ALT 13 13  BILITOT 0.7 0.5  PROT 6.0*  6.2   No results for input(s): LIPASE, AMYLASE in the last 8760 hours. No results for input(s): AMMONIA in the last 8760 hours. CBC: Recent Labs    01/23/20 0815 07/23/20 0815  WBC 8.3 9.1  NEUTROABS 4,880 5,870  HGB 13.8 13.8  HCT 40.3 40.4  MCV 96.2 97.8  PLT 299 290   Lipid Panel: Recent Labs    01/23/20 0815 07/23/20 0815  CHOL 238* 267*  HDL 53 56  LDLCALC 158* 185*  TRIG 141 126  CHOLHDL 4.5 4.8   No results found for: HGBA1C  Procedures since last visit: No results found.  Assessment/Plan 1. Spider bite wound, accidental or unintentional, initial encounter Using Clobetastol and it is helping the Redness and swelling  2. Fall, initial encounter In 4/22 No Injuries Tripped on the Curb Emphasize to be careful  3. Mixed hyperlipidemia Refuses Statin again She wants to try Exercise and Diet Has Dad with h/o CAD at age 29 No other risk factors 4. Hypothyroidism, unspecified type Stable  5. Iron deficiency Hgb Normal But says Iron helps with Fatigue So wants to continue  6. Senile osteoporosis Repeat DEXA Pending On Fosamax  7. Umbilical hernia without obstruction and without gangrene Seen  Surgeon No Issues  8. Mild cognitive impairment Still has issues with Multitasking  But staying independent in her ADLS and IADLS Still driving Had MMSe by [place] 25/30 Missed on calculations Recall was 3/3 Repeat MMSE next Visit  9. Vitamin B 12 deficiency Levels have been normal before She thinks it helps with her Neuropathy  10. Mild Polyneuropathy Takes Metanx  11. Weakness Working with trainer to help Endurance 12. Is due for Follow up Colonoscopy Will discuss again in Next visit Also if she wants Mammogram   Labs/tests ordered:  * No order type specified * Next appt:  11/26/2020

## 2020-07-31 ENCOUNTER — Other Ambulatory Visit: Payer: Self-pay | Admitting: Internal Medicine

## 2020-07-31 DIAGNOSIS — M816 Localized osteoporosis [Lequesne]: Secondary | ICD-10-CM

## 2020-08-11 ENCOUNTER — Ambulatory Visit
Admission: RE | Admit: 2020-08-11 | Discharge: 2020-08-11 | Disposition: A | Discharge status: Home / Self Care | Payer: Medicare Other | Source: Ambulatory Visit | Attending: Internal Medicine | Admitting: Internal Medicine

## 2020-08-11 ENCOUNTER — Other Ambulatory Visit: Payer: Self-pay

## 2020-08-11 DIAGNOSIS — M8588 Other specified disorders of bone density and structure, other site: Secondary | ICD-10-CM | POA: Diagnosis not present

## 2020-08-11 DIAGNOSIS — M810 Age-related osteoporosis without current pathological fracture: Secondary | ICD-10-CM | POA: Diagnosis not present

## 2020-08-11 DIAGNOSIS — Z780 Asymptomatic menopausal state: Secondary | ICD-10-CM | POA: Diagnosis not present

## 2020-08-31 ENCOUNTER — Other Ambulatory Visit: Payer: Self-pay

## 2020-08-31 ENCOUNTER — Emergency Department (HOSPITAL_COMMUNITY): Payer: Medicare Other

## 2020-08-31 ENCOUNTER — Encounter (HOSPITAL_COMMUNITY): Payer: Self-pay | Admitting: Emergency Medicine

## 2020-08-31 ENCOUNTER — Inpatient Hospital Stay (HOSPITAL_COMMUNITY)
Admission: EM | Admit: 2020-08-31 | Discharge: 2020-09-02 | DRG: 247 | Disposition: A | Discharge status: Home / Self Care | Payer: Medicare Other | Attending: Cardiology | Admitting: Cardiology

## 2020-08-31 DIAGNOSIS — Z20822 Contact with and (suspected) exposure to covid-19: Secondary | ICD-10-CM | POA: Diagnosis present

## 2020-08-31 DIAGNOSIS — E7800 Pure hypercholesterolemia, unspecified: Secondary | ICD-10-CM | POA: Diagnosis not present

## 2020-08-31 DIAGNOSIS — Z79899 Other long term (current) drug therapy: Secondary | ICD-10-CM

## 2020-08-31 DIAGNOSIS — Z955 Presence of coronary angioplasty implant and graft: Secondary | ICD-10-CM

## 2020-08-31 DIAGNOSIS — Z7983 Long term (current) use of bisphosphonates: Secondary | ICD-10-CM

## 2020-08-31 DIAGNOSIS — E039 Hypothyroidism, unspecified: Secondary | ICD-10-CM | POA: Diagnosis present

## 2020-08-31 DIAGNOSIS — E782 Mixed hyperlipidemia: Secondary | ICD-10-CM | POA: Diagnosis present

## 2020-08-31 DIAGNOSIS — E876 Hypokalemia: Secondary | ICD-10-CM | POA: Diagnosis not present

## 2020-08-31 DIAGNOSIS — I255 Ischemic cardiomyopathy: Secondary | ICD-10-CM | POA: Diagnosis not present

## 2020-08-31 DIAGNOSIS — R079 Chest pain, unspecified: Secondary | ICD-10-CM | POA: Diagnosis not present

## 2020-08-31 DIAGNOSIS — I214 Non-ST elevation (NSTEMI) myocardial infarction: Secondary | ICD-10-CM | POA: Diagnosis not present

## 2020-08-31 DIAGNOSIS — Z8349 Family history of other endocrine, nutritional and metabolic diseases: Secondary | ICD-10-CM

## 2020-08-31 DIAGNOSIS — Z888 Allergy status to other drugs, medicaments and biological substances status: Secondary | ICD-10-CM

## 2020-08-31 DIAGNOSIS — I25118 Atherosclerotic heart disease of native coronary artery with other forms of angina pectoris: Secondary | ICD-10-CM | POA: Diagnosis present

## 2020-08-31 DIAGNOSIS — H9190 Unspecified hearing loss, unspecified ear: Secondary | ICD-10-CM | POA: Diagnosis present

## 2020-08-31 DIAGNOSIS — Z79890 Hormone replacement therapy (postmenopausal): Secondary | ICD-10-CM

## 2020-08-31 LAB — CBC WITH DIFFERENTIAL/PLATELET
Abs Immature Granulocytes: 0.07 10*3/uL (ref 0.00–0.07)
Basophils Absolute: 0.1 10*3/uL (ref 0.0–0.1)
Basophils Relative: 1 %
Eosinophils Absolute: 0.1 10*3/uL (ref 0.0–0.5)
Eosinophils Relative: 1 %
HCT: 42.3 % (ref 36.0–46.0)
Hemoglobin: 13.8 g/dL (ref 12.0–15.0)
Immature Granulocytes: 1 %
Lymphocytes Relative: 22 %
Lymphs Abs: 2.3 10*3/uL (ref 0.7–4.0)
MCH: 32.5 pg (ref 26.0–34.0)
MCHC: 32.6 g/dL (ref 30.0–36.0)
MCV: 99.8 fL (ref 80.0–100.0)
Monocytes Absolute: 0.6 10*3/uL (ref 0.1–1.0)
Monocytes Relative: 6 %
Neutro Abs: 7.1 10*3/uL (ref 1.7–7.7)
Neutrophils Relative %: 69 %
Platelets: 280 10*3/uL (ref 150–400)
RBC: 4.24 MIL/uL (ref 3.87–5.11)
RDW: 12.4 % (ref 11.5–15.5)
WBC: 10.2 10*3/uL (ref 4.0–10.5)
nRBC: 0 % (ref 0.0–0.2)

## 2020-08-31 LAB — COMPREHENSIVE METABOLIC PANEL
ALT: 19 U/L (ref 0–44)
AST: 21 U/L (ref 15–41)
Albumin: 4 g/dL (ref 3.5–5.0)
Alkaline Phosphatase: 53 U/L (ref 38–126)
Anion gap: 8 (ref 5–15)
BUN: 19 mg/dL (ref 8–23)
CO2: 25 mmol/L (ref 22–32)
Calcium: 9 mg/dL (ref 8.9–10.3)
Chloride: 103 mmol/L (ref 98–111)
Creatinine, Ser: 0.62 mg/dL (ref 0.44–1.00)
GFR, Estimated: >60 mL/min (ref >60)
Glucose, Bld: 133 mg/dL — ABNORMAL HIGH (ref 70–99)
Potassium: 4.2 mmol/L (ref 3.5–5.1)
Sodium: 136 mmol/L (ref 135–145)
Total Bilirubin: 0.6 mg/dL (ref 0.3–1.2)
Total Protein: 6.7 g/dL (ref 6.5–8.1)

## 2020-08-31 LAB — TROPONIN I (HIGH SENSITIVITY): Troponin I (High Sensitivity): 72 ng/L — ABNORMAL HIGH (ref <18)

## 2020-08-31 NOTE — ED Notes (Signed)
PT complaining at this time of nausea and being hot. PT was provided an emesis bag

## 2020-08-31 NOTE — ED Provider Notes (Signed)
10:33 PM Pt reassess - reports she is feeling generally worse. CP has been constant since 6pm.  Nausea persists.  No vomiting.      Ayven Pheasant, Gwenlyn Perking 08/31/20 2233    Carmin Muskrat, MD 08/31/20 [phone]

## 2020-08-31 NOTE — ED Triage Notes (Signed)
Patient reports intermittent central chest pain with mild SOB , nausea and diaphoresis onset this afternoon , patient added left ankle swelling for several weeks .

## 2020-08-31 NOTE — ED Provider Notes (Signed)
Emergency Medicine Provider Triage Evaluation Note  BRANDILYNN TAORMINA , a 80 y.o. female  was evaluated in triage.  Pt complains of chest pain that started earlier today. Described at tightness to the center of chest that does not radiate. Had associated diaphoresis but no sob, nausea, lightheadedness.  Review of Systems  Positive: Chest pain, diaphoresis Negative: Sob, nausea, lightheadedness  Physical Exam  BP (!) 160/88 (BP Location: Right Arm)   Pulse 72   Temp 98.4 F (36.9 C) (Oral)   Resp 14   SpO2 98%  Gen:   Awake, no distress   Resp:  Normal effort  MSK:   Moves extremities without difficulty  Other:  Lungs ctab, heart rrr  Medical Decision Making  Medically screening exam initiated at 9:30 PM.  Appropriate orders placed.  ILITHYIA TITZER was informed that the remainder of the evaluation will be completed by another provider, this initial triage assessment does not replace that evaluation, and the importance of remaining in the ED until their evaluation is complete.     Bishop Dublin 08/31/20 2131    Hayden Rasmussen, MD 09/01/20 1019

## 2020-09-01 ENCOUNTER — Inpatient Hospital Stay (HOSPITAL_COMMUNITY)
Admission: EM | Disposition: A | Discharge status: Home / Self Care | Payer: Self-pay | Source: Home / Self Care | Attending: Cardiology

## 2020-09-01 ENCOUNTER — Inpatient Hospital Stay (HOSPITAL_COMMUNITY): Payer: Medicare Other

## 2020-09-01 ENCOUNTER — Encounter (HOSPITAL_COMMUNITY): Payer: Self-pay | Admitting: Student in an Organized Health Care Education/Training Program

## 2020-09-01 DIAGNOSIS — I214 Non-ST elevation (NSTEMI) myocardial infarction: Secondary | ICD-10-CM

## 2020-09-01 DIAGNOSIS — H9190 Unspecified hearing loss, unspecified ear: Secondary | ICD-10-CM | POA: Diagnosis present

## 2020-09-01 DIAGNOSIS — E7800 Pure hypercholesterolemia, unspecified: Secondary | ICD-10-CM | POA: Diagnosis not present

## 2020-09-01 DIAGNOSIS — I255 Ischemic cardiomyopathy: Secondary | ICD-10-CM | POA: Diagnosis not present

## 2020-09-01 DIAGNOSIS — E039 Hypothyroidism, unspecified: Secondary | ICD-10-CM | POA: Diagnosis not present

## 2020-09-01 DIAGNOSIS — E785 Hyperlipidemia, unspecified: Secondary | ICD-10-CM | POA: Diagnosis not present

## 2020-09-01 DIAGNOSIS — I25118 Atherosclerotic heart disease of native coronary artery with other forms of angina pectoris: Secondary | ICD-10-CM

## 2020-09-01 DIAGNOSIS — Z79899 Other long term (current) drug therapy: Secondary | ICD-10-CM | POA: Diagnosis not present

## 2020-09-01 DIAGNOSIS — Z8349 Family history of other endocrine, nutritional and metabolic diseases: Secondary | ICD-10-CM | POA: Diagnosis not present

## 2020-09-01 DIAGNOSIS — Z7983 Long term (current) use of bisphosphonates: Secondary | ICD-10-CM | POA: Diagnosis not present

## 2020-09-01 DIAGNOSIS — Z20822 Contact with and (suspected) exposure to covid-19: Secondary | ICD-10-CM | POA: Diagnosis present

## 2020-09-01 DIAGNOSIS — E876 Hypokalemia: Secondary | ICD-10-CM | POA: Diagnosis not present

## 2020-09-01 DIAGNOSIS — Z79890 Hormone replacement therapy (postmenopausal): Secondary | ICD-10-CM | POA: Diagnosis not present

## 2020-09-01 DIAGNOSIS — Z888 Allergy status to other drugs, medicaments and biological substances status: Secondary | ICD-10-CM | POA: Diagnosis not present

## 2020-09-01 HISTORY — PX: LEFT HEART CATH AND CORONARY ANGIOGRAPHY: CATH118249

## 2020-09-01 HISTORY — PX: CORONARY STENT INTERVENTION: CATH118234

## 2020-09-01 LAB — ECHOCARDIOGRAM COMPLETE
AR max vel: 1.88 cm2
AV Area VTI: 1.79 cm2
AV Area mean vel: 1.82 cm2
AV Mean grad: 4 mmHg
AV Peak grad: 7.1 mmHg
Ao pk vel: 1.33 m/s
Area-P 1/2: 3.37 cm2
Height: 62 in
S' Lateral: 2.6 cm
Weight: 2462.1 oz

## 2020-09-01 LAB — CBC
HCT: 37.6 % (ref 36.0–46.0)
Hemoglobin: 12.9 g/dL (ref 12.0–15.0)
MCH: 33.4 pg (ref 26.0–34.0)
MCHC: 34.3 g/dL (ref 30.0–36.0)
MCV: 97.4 fL (ref 80.0–100.0)
Platelets: 246 10*3/uL (ref 150–400)
RBC: 3.86 MIL/uL — ABNORMAL LOW (ref 3.87–5.11)
RDW: 12.4 % (ref 11.5–15.5)
WBC: 14.2 10*3/uL — ABNORMAL HIGH (ref 4.0–10.5)
nRBC: 0 % (ref 0.0–0.2)

## 2020-09-01 LAB — RESP PANEL BY RT-PCR (FLU A&B, COVID) ARPGX2
Influenza A by PCR: NEGATIVE
Influenza B by PCR: NEGATIVE
SARS Coronavirus 2 by RT PCR: NEGATIVE

## 2020-09-01 LAB — TROPONIN I (HIGH SENSITIVITY): Troponin I (High Sensitivity): 1077 ng/L (ref <18)

## 2020-09-01 LAB — CREATININE, SERUM
Creatinine, Ser: 0.5 mg/dL (ref 0.44–1.00)
GFR, Estimated: >60 mL/min (ref >60)

## 2020-09-01 LAB — HEPARIN LEVEL (UNFRACTIONATED): Heparin Unfractionated: 0.44 IU/mL (ref 0.30–0.70)

## 2020-09-01 LAB — POCT ACTIVATED CLOTTING TIME
Activated Clotting Time: 254 seconds
Activated Clotting Time: 335 seconds

## 2020-09-01 SURGERY — LEFT HEART CATH AND CORONARY ANGIOGRAPHY
Anesthesia: LOCAL

## 2020-09-01 MED ORDER — HEPARIN (PORCINE) IN NACL 1000-0.9 UT/500ML-% IV SOLN
INTRAVENOUS | Status: AC
  Filled 2020-09-01: qty 1000

## 2020-09-01 MED ORDER — TICAGRELOR 90 MG PO TABS
90.0000 mg | ORAL_TABLET | Freq: Two times a day (BID) | ORAL | Status: DC
  Administered 2020-09-01 – 2020-09-02 (×2): 90 mg via ORAL
  Filled 2020-09-01 (×2): qty 1

## 2020-09-01 MED ORDER — ASCORBIC ACID 500 MG PO TABS
500.0000 mg | ORAL_TABLET | Freq: Every day | ORAL | Status: DC
  Administered 2020-09-01 – 2020-09-02 (×2): 500 mg via ORAL
  Filled 2020-09-01 (×2): qty 1

## 2020-09-01 MED ORDER — MIDAZOLAM HCL 2 MG/2ML IJ SOLN
INTRAMUSCULAR | Status: DC | PRN
Start: 2020-09-01 — End: 2020-09-01
  Administered 2020-09-01: 0.5 mg via INTRAVENOUS

## 2020-09-01 MED ORDER — PERFLUTREN LIPID MICROSPHERE
1.0000 mL | INTRAVENOUS | Status: AC | PRN
  Administered 2020-09-01: 5 mL via INTRAVENOUS
  Filled 2020-09-01: qty 10

## 2020-09-01 MED ORDER — FENTANYL CITRATE (PF) 100 MCG/2ML IJ SOLN
INTRAMUSCULAR | Status: AC
  Filled 2020-09-01: qty 2

## 2020-09-01 MED ORDER — SODIUM CHLORIDE 0.9% FLUSH: 3.0000 mL | INTRAVENOUS | Status: DC | PRN

## 2020-09-01 MED ORDER — HYDRALAZINE HCL 20 MG/ML IJ SOLN: 10.0000 mg | INTRAMUSCULAR | Status: AC | PRN

## 2020-09-01 MED ORDER — TICAGRELOR 90 MG PO TABS
ORAL_TABLET | ORAL | Status: AC
  Filled 2020-09-01: qty 1

## 2020-09-01 MED ORDER — ONDANSETRON HCL 4 MG/2ML IJ SOLN: 4.0000 mg | Freq: Four times a day (QID) | INTRAMUSCULAR | Status: DC | PRN

## 2020-09-01 MED ORDER — VERAPAMIL HCL 2.5 MG/ML IV SOLN
INTRAVENOUS | Status: DC | PRN
  Administered 2020-09-01: 10 mL via INTRA_ARTERIAL

## 2020-09-01 MED ORDER — LABETALOL HCL 5 MG/ML IV SOLN: 10.0000 mg | INTRAVENOUS | Status: AC | PRN

## 2020-09-01 MED ORDER — VITAMIN D 25 MCG (1000 UNIT) PO TABS
2000.0000 [IU] | ORAL_TABLET | Freq: Every day | ORAL | Status: DC
  Administered 2020-09-01 – 2020-09-02 (×2): 2000 [IU] via ORAL
  Filled 2020-09-01 (×2): qty 2

## 2020-09-01 MED ORDER — VERAPAMIL HCL 2.5 MG/ML IV SOLN
INTRAVENOUS | Status: AC
  Filled 2020-09-01: qty 2

## 2020-09-01 MED ORDER — TICAGRELOR 90 MG PO TABS
ORAL_TABLET | ORAL | Status: DC | PRN
Start: 2020-09-01 — End: 2020-09-01
  Administered 2020-09-01: 180 mg via ORAL

## 2020-09-01 MED ORDER — ASPIRIN EC 81 MG PO TBEC: 81.0000 mg | DELAYED_RELEASE_TABLET | Freq: Every day | ORAL | Status: DC

## 2020-09-01 MED ORDER — SODIUM CHLORIDE 0.9 % IV SOLN
INTRAVENOUS | Status: AC | PRN
  Administered 2020-09-01: 400 mL via INTRAVENOUS
  Administered 2020-09-01: 100 mL via INTRAVENOUS

## 2020-09-01 MED ORDER — ACETAMINOPHEN 325 MG PO TABS: 650.0000 mg | ORAL_TABLET | ORAL | Status: DC | PRN

## 2020-09-01 MED ORDER — NITROGLYCERIN 2 % TD OINT
1.0000 [in_us] | TOPICAL_OINTMENT | Freq: Once | TRANSDERMAL | Status: AC
  Administered 2020-09-01: 1 [in_us] via TOPICAL
  Filled 2020-09-01: qty 1

## 2020-09-01 MED ORDER — HEPARIN SODIUM (PORCINE) 1000 UNIT/ML IJ SOLN
INTRAMUSCULAR | Status: AC
  Filled 2020-09-01: qty 1

## 2020-09-01 MED ORDER — SODIUM CHLORIDE 0.9 % IV SOLN: INTRAVENOUS | Status: AC

## 2020-09-01 MED ORDER — OXYCODONE HCL 5 MG PO TABS
5.0000 mg | ORAL_TABLET | ORAL | Status: DC | PRN
Start: 2020-09-01 — End: 2020-09-02

## 2020-09-01 MED ORDER — SODIUM CHLORIDE 0.9% FLUSH
3.0000 mL | Freq: Two times a day (BID) | INTRAVENOUS | Status: DC
  Administered 2020-09-01: 3 mL via INTRAVENOUS

## 2020-09-01 MED ORDER — SODIUM CHLORIDE 0.9 % WEIGHT BASED INFUSION
3.0000 mL/kg/h | INTRAVENOUS | Status: DC
  Administered 2020-09-01: 3 mL/kg/h via INTRAVENOUS

## 2020-09-01 MED ORDER — NITROGLYCERIN 1 MG/10 ML FOR IR/CATH LAB
INTRA_ARTERIAL | Status: DC | PRN
  Administered 2020-09-01 (×2): 200 ug via INTRACORONARY

## 2020-09-01 MED ORDER — NITROGLYCERIN 1 MG/10 ML FOR IR/CATH LAB
INTRA_ARTERIAL | Status: AC
  Filled 2020-09-01: qty 10

## 2020-09-01 MED ORDER — SODIUM CHLORIDE 0.9 % WEIGHT BASED INFUSION
1.0000 mL/kg/h | INTRAVENOUS | Status: DC
  Administered 2020-09-01: 1 mL/kg/h via INTRAVENOUS

## 2020-09-01 MED ORDER — CALCIUM CARBONATE-VITAMIN D 500-200 MG-UNIT PO TABS
2.0000 | ORAL_TABLET | Freq: Every day | ORAL | Status: DC
  Administered 2020-09-01 – 2020-09-02 (×2): 2 via ORAL
  Filled 2020-09-01 (×2): qty 2

## 2020-09-01 MED ORDER — L-METHYLFOLATE-B6-B12 3-35-2 MG PO TABS: 1.0000 | ORAL_TABLET | Freq: Every day | ORAL | Status: DC

## 2020-09-01 MED ORDER — LEVOTHYROXINE SODIUM 25 MCG PO TABS: 125.0000 ug | ORAL_TABLET | Freq: Every day | ORAL | Status: DC

## 2020-09-01 MED ORDER — ASPIRIN 81 MG PO CHEW
324.0000 mg | CHEWABLE_TABLET | Freq: Once | ORAL | Status: AC
  Administered 2020-09-01: 324 mg via ORAL
  Filled 2020-09-01: qty 4

## 2020-09-01 MED ORDER — FENTANYL CITRATE (PF) 100 MCG/2ML IJ SOLN
INTRAMUSCULAR | Status: DC | PRN
  Administered 2020-09-01: 25 ug via INTRAVENOUS

## 2020-09-01 MED ORDER — MIDAZOLAM HCL 2 MG/2ML IJ SOLN
INTRAMUSCULAR | Status: AC
  Filled 2020-09-01: qty 2

## 2020-09-01 MED ORDER — LIDOCAINE HCL (PF) 1 % IJ SOLN
INTRAMUSCULAR | Status: AC
  Filled 2020-09-01: qty 30

## 2020-09-01 MED ORDER — HEPARIN SODIUM (PORCINE) 1000 UNIT/ML IJ SOLN
INTRAMUSCULAR | Status: DC | PRN
  Administered 2020-09-01: 2500 [IU] via INTRAVENOUS
  Administered 2020-09-01: 3500 [IU] via INTRAVENOUS
  Administered 2020-09-01: 5000 [IU] via INTRAVENOUS

## 2020-09-01 MED ORDER — IOHEXOL 350 MG/ML SOLN
INTRAVENOUS | Status: DC | PRN
  Administered 2020-09-01: 165 mL via INTRA_ARTERIAL

## 2020-09-01 MED ORDER — ROSUVASTATIN CALCIUM 20 MG PO TABS
40.0000 mg | ORAL_TABLET | Freq: Every day | ORAL | Status: DC
  Administered 2020-09-01 – 2020-09-02 (×2): 40 mg via ORAL
  Filled 2020-09-01 (×2): qty 2

## 2020-09-01 MED ORDER — LIDOCAINE HCL (PF) 1 % IJ SOLN
INTRAMUSCULAR | Status: DC | PRN
  Administered 2020-09-01: 2 mL via INTRADERMAL

## 2020-09-01 MED ORDER — HEPARIN BOLUS VIA INFUSION
3000.0000 [IU] | Freq: Once | INTRAVENOUS | Status: AC
  Administered 2020-09-01: 3000 [IU] via INTRAVENOUS
  Filled 2020-09-01: qty 3000

## 2020-09-01 MED ORDER — ASPIRIN 81 MG PO CHEW
81.0000 mg | CHEWABLE_TABLET | ORAL | Status: AC
  Administered 2020-09-01: 81 mg via ORAL
  Filled 2020-09-01: qty 1

## 2020-09-01 MED ORDER — SODIUM CHLORIDE 0.9 % IV SOLN: 250.0000 mL | INTRAVENOUS | Status: DC | PRN

## 2020-09-01 MED ORDER — MIDAZOLAM HCL 2 MG/2ML IJ SOLN
INTRAMUSCULAR | Status: DC | PRN
  Administered 2020-09-01: 0.5 mg via INTRAVENOUS

## 2020-09-01 MED ORDER — HEPARIN (PORCINE) 25000 UT/250ML-% IV SOLN
800.0000 [IU]/h | INTRAVENOUS | Status: DC
  Administered 2020-09-01: 800 [IU]/h via INTRAVENOUS
  Filled 2020-09-01: qty 250

## 2020-09-01 MED ORDER — ASPIRIN 81 MG PO CHEW
81.0000 mg | CHEWABLE_TABLET | Freq: Every day | ORAL | Status: DC
  Administered 2020-09-02: 81 mg via ORAL
  Filled 2020-09-01: qty 1

## 2020-09-01 MED ORDER — HEPARIN SODIUM (PORCINE) 5000 UNIT/ML IJ SOLN
5000.0000 [IU] | Freq: Three times a day (TID) | INTRAMUSCULAR | Status: DC
  Administered 2020-09-01 – 2020-09-02 (×2): 5000 [IU] via SUBCUTANEOUS
  Filled 2020-09-01 (×2): qty 1

## 2020-09-01 MED ORDER — NITROGLYCERIN 0.4 MG SL SUBL
0.4000 mg | SUBLINGUAL_TABLET | SUBLINGUAL | Status: DC | PRN
  Administered 2020-09-01: 0.4 mg via SUBLINGUAL
  Filled 2020-09-01: qty 1

## 2020-09-01 MED ORDER — NITROGLYCERIN 0.4 MG SL SUBL: 0.4000 mg | SUBLINGUAL_TABLET | SUBLINGUAL | Status: DC | PRN

## 2020-09-01 MED ORDER — SODIUM CHLORIDE 0.9% FLUSH
3.0000 mL | Freq: Two times a day (BID) | INTRAVENOUS | Status: DC
  Administered 2020-09-02: 3 mL via INTRAVENOUS

## 2020-09-01 MED ORDER — ONDANSETRON HCL 4 MG PO TABS
4.0000 mg | ORAL_TABLET | Freq: Four times a day (QID) | ORAL | Status: DC | PRN
  Administered 2020-09-01: 4 mg via ORAL
  Filled 2020-09-01: qty 1

## 2020-09-01 SURGICAL SUPPLY — 16 items
BALLN SAPPHIRE 2.0X15 (BALLOONS) ×2
BALLN SAPPHIRE ~~LOC~~ 2.25X15 (BALLOONS) ×1 IMPLANT
BALLOON SAPPHIRE 2.0X15 (BALLOONS) IMPLANT
CATH 5FR JL3.5 JR4 ANG PIG MP (CATHETERS) ×1 IMPLANT
CATH VISTA GUIDE 6FR XBLAD3.5 (CATHETERS) ×1 IMPLANT
DEVICE RAD COMP TR BAND LRG (VASCULAR PRODUCTS) ×1 IMPLANT
GLIDESHEATH SLEND A-KIT 6F 22G (SHEATH) ×1 IMPLANT
GUIDEWIRE INQWIRE 1.5J.035X260 (WIRE) IMPLANT
INQWIRE 1.5J .035X260CM (WIRE) ×2
KIT ENCORE 26 ADVANTAGE (KITS) ×1 IMPLANT
KIT HEART LEFT (KITS) ×2 IMPLANT
PACK CARDIAC CATHETERIZATION (CUSTOM PROCEDURE TRAY) ×2 IMPLANT
STENT ONYX FRONTIER 2.0X22 (Permanent Stent) ×1 IMPLANT
TRANSDUCER W/STOPCOCK (MISCELLANEOUS) ×2 IMPLANT
TUBING CIL FLEX 10 FLL-RA (TUBING) ×2 IMPLANT
WIRE ASAHI PROWATER 180CM (WIRE) ×1 IMPLANT

## 2020-09-01 NOTE — ED Notes (Signed)
Attempted report for second time. RN unavailable to take report. Pt going to 6E30. Bed assigned for 1:05 hrs. Provided call back number, expecting phone call back within next 5-10 minutes

## 2020-09-01 NOTE — Progress Notes (Signed)
ANTICOAGULATION CONSULT NOTE  Pharmacy Consult for heparin Indication: chest pain/ACS  Allergies  Allergen Reactions   Atorvastatin     Muscle weakness   Rosuvastatin     Muscle weaknes    Patient Measurements: Height: 5\' 2"  (157.5 cm) Weight: 69.8 kg (153 lb 14.1 oz) IBW/kg (Calculated) : 50.1 Heparin Dosing Weight: 65kg  Vital Signs: Temp: 98.1 F (36.7 C) (06/28 0823) Temp Source: Oral (06/28 0823) BP: 104/63 (06/28 0823) Pulse Rate: 60 (06/28 0823)  Labs: Recent Labs    08/31/20 2133 08/31/20 2333 09/01/20 1011  HGB 13.8  --   --   HCT 42.3  --   --   PLT 280  --   --   HEPARINUNFRC  --   --  0.44  CREATININE 0.62  --   --   TROPONINIHS 72* 1,077*  --      Estimated Creatinine Clearance: 52.2 mL/min (by C-G formula based on SCr of 0.62 mg/dL).   Medical History: Past Medical History:  Diagnosis Date   Anemia 12/2015   Bronchitis, chronic (HCC)    in her 57s   Cataract    Chronic kidney disease    in 20s had nephritis   Closed fracture of right distal humerus    right   Hearing loss 05/03/2016   High cholesterol    Hypothyroidism    Osteopenia    Pericarditis 01/1999   Pneumothorax 01/1999   Thyroid disorder    Vitamin B 12 deficiency     Assessment: 80yo female CP/tightness with associated diaphoresis, initial troponin elevated and now rising (72>1077), to begin heparin.  Initial heparin level therapeutic at 0.44, no S/Sx bleeding documented.  Goal of Therapy:  Heparin level 0.3-0.7 units/ml Monitor platelets by anticoagulation protocol: Yes   Plan:  Continue heparin 800 units/h Daily heparin level and CBC   Arrie Senate, PharmD, [place], [facility] Clinical Pharmacist [phone] Please check AMION for all [facility] Pharmacy numbers 09/01/2020

## 2020-09-01 NOTE — Progress Notes (Signed)
ANTICOAGULATION CONSULT NOTE - Initial Consult  Pharmacy Consult for heparin Indication: chest pain/ACS  Allergies  Allergen Reactions   Atorvastatin     Muscle weakness   Rosuvastatin     Muscle weaknes    Patient Measurements: Height: 5\' 2"  (157.5 cm) Weight: 75 kg (165 lb 5.5 oz) IBW/kg (Calculated) : 50.1 Heparin Dosing Weight: 65kg  Vital Signs: Temp: 98.4 F (36.9 C) (06/27 2111) Temp Source: Oral (06/27 2111) BP: 114/78 (06/28 0145) Pulse Rate: 59 (06/28 0145)  Labs: Recent Labs    08/31/20 2133 08/31/20 2333  HGB 13.8  --   HCT 42.3  --   PLT 280  --   CREATININE 0.62  --   TROPONINIHS 72* 1,077*    Estimated Creatinine Clearance: 54.1 mL/min (by C-G formula based on SCr of 0.62 mg/dL).   Medical History: Past Medical History:  Diagnosis Date   Anemia 12/2015   Bronchitis, chronic (HCC)    in her 43s   Cataract    Chronic kidney disease    in 20s had nephritis   Closed fracture of right distal humerus    right   Hearing loss 05/03/2016   High cholesterol    Hypothyroidism    Osteopenia    Pericarditis 01/1999   Pneumothorax 01/1999   Thyroid disorder    Vitamin B 12 deficiency     Assessment: 80yo female CP/tightness with associated diaphoresis, initial troponin elevated and now rising (72>1077), to begin heparin.  Goal of Therapy:  Heparin level 0.3-0.7 units/ml Monitor platelets by anticoagulation protocol: Yes   Plan:  Heparin 3000 units IV bolus x1 followed by gtt at 800 units/hr. Monitor heparin levels and CBC.  Wynona Neat, PharmD, BCPS  09/01/2020,1:59 AM

## 2020-09-01 NOTE — CV Procedure (Signed)
Segmental 99% stenosis in the mid LAD after the origin of a large first diagonal, [place] 010.  TIMI grade II flow noted beyond the high-grade stenosis. 22 x 2.0 mm Onyx postdilated to 2.25 mm in diameter at 14 atm.,  0% stenosis, and TIMI grade III flow. Widely patent left main, proximal LAD, circumflex, and right coronary. Apical severe hypokinesis.  LVEF 55%.  LVEDP 6 mmHg.

## 2020-09-01 NOTE — ED Provider Notes (Signed)
[facility] EMERGENCY DEPARTMENT Provider Note   CSN: [id number] Arrival date & time: 08/31/20  2105     History Chief Complaint  Patient presents with   Chest Pain    Jessica [name] is a 80 y.o. female.  Patient is a 80 year old female with past medical history of hypothyroidism, hyperlipidemia.  Patient presenting today for evaluation of chest discomfort.  She reports an episode of this this morning, then went to her primary doctor's office and was sent here.  Her discomfort resolved in the afternoon, then recurred this evening at approximately 6 PM and has been constant since.  She denies shortness of breath, nausea, diaphoresis, or radiation to the arm or jaw.  She has difficulty describing the nature of her discomfort.  She does report a history of pericarditis, however this feels different.  The history is provided by the patient.  Chest Pain Pain location:  Substernal area Pain quality comment:  Discomfort Pain radiates to:  Does not radiate Pain severity:  Moderate Onset quality:  Sudden Duration:  6 hours Timing:  Constant Progression:  Unchanged Chronicity:  New Relieved by:  Nothing Worsened by:  Nothing Ineffective treatments:  None tried     Past Medical History:  Diagnosis Date   Anemia 12/2015   Bronchitis, chronic (HCC)    in her 47s   Cataract    Chronic kidney disease    in 20s had nephritis   Closed fracture of right distal humerus    right   Hearing loss 05/03/2016   High cholesterol    Hypothyroidism    Osteopenia    Pericarditis 01/1999   Pneumothorax 01/1999   Thyroid disorder    Vitamin B 12 deficiency     Patient Active Problem List   Diagnosis Date Noted   Spider bite allergy, current reaction 10/15/2019   Impacted cerumen of both ears 08/20/2019   Closed fracture of right distal humerus 04/10/2019   Dyspnea 05/09/2018   Vitamin B 12 deficiency 05/09/2018   Cough 03/06/2018   Senile osteoporosis 11/20/2017    Iron deficiency 87/86/7672   Lichen sclerosus 09/47/0962   External hemorrhoid 04/26/2017   Weakness of left leg 06/07/2016   Balance disorder 05/24/2016   Memory deficit 05/24/2016   Anemia 05/19/2016   Hearing loss 05/03/2016   Hypothyroidism 07/24/2013   Bradycardia on ECG 07/24/2013   Numbness 08/07/2012   HYPERLIPIDEMIA 12/29/2006    Past Surgical History:  Procedure Laterality Date   birthmark      removed,left leg   CATARACT EXTRACTION  2017   ORIF HUMERUS FRACTURE Right 04/10/2019   Procedure: RIGHT OPEN REDUCTION INTERNAL FIXATION (ORIF) DISTAL HUMERUS FRACTURE WITH EXTENSION;  Surgeon: Hiram Gash, MD;  Location: [place];  Service: Orthopedics;  Laterality: Right;   [place]?     OB History   No obstetric history on file.     Family History  Problem Relation Age of Onset   Diabetes Mother    Asthma Mother    Ataxia Father    Stroke Father    Cancer - Cervical Sister    Thyroid disease Sister    Diabetes Other    Diabetes Maternal Grandmother    Arthritis Paternal Grandmother    Colon cancer Neg Hx     Social History   Tobacco Use   Smoking status: Never   Smokeless tobacco: Never  Vaping Use   Vaping Use: Never used  Substance Use Topics  Alcohol use: Yes    Alcohol/week: 3.0 standard drinks    Types: 3 Glasses of wine per week    Comment: social   Drug use: No    Home Medications Prior to Admission medications   Medication Sig Start Date End Date Taking? Authorizing Provider  alendronate (FOSAMAX) 70 MG tablet TAKE 1 TABLET BY MOUTH EVERY 7 DAYS WITH A FULL GLASS OF WATER AND ON AN EMPTY STOMACH 07/31/20   Virgie Dad, MD  calcium carbonate (OS-CAL) 1250 (500 Ca) MG chewable tablet Chew 1 tablet (1,250 mg total) by mouth daily. 09/11/17   Blanchie Serve, MD  cholecalciferol (VITAMIN D) 1000 UNITS tablet Take 2,000 Units by mouth daily.     [provider]  FERROUS SULFATE PO Take 1  tablet by mouth daily.    [provider]  l-methylfolate-B6-B12 (METANX) 3-35-2 MG TABS tablet Take 1 tablet by mouth daily.    [provider]  levothyroxine (SYNTHROID) 125 MCG tablet TAKE 1 TABLET BY MOUTH  DAILY 04/02/20   Virgie Dad, MD  Red Yeast Rice 600 MG TABS Take 1 tablet by mouth daily.     [provider]  simethicone (GAS-X) 80 MG chewable tablet Chew 1 tablet (80 mg total) by mouth every 6 (six) hours as needed for flatulence. 04/20/20   Ngetich, Dinah C, NP  vitamin C (ASCORBIC ACID) 500 MG tablet Take 500 mg by mouth daily.    [provider]    Allergies    Atorvastatin and Rosuvastatin  Review of Systems   Review of Systems  Cardiovascular:  Positive for chest pain.  All other systems reviewed and are negative.  Physical Exam Updated Vital Signs BP (!) 154/79   Pulse 65   Temp 98.4 F (36.9 C) (Oral)   Resp 16   Ht 5\' 2"  (1.575 m)   Wt 75 kg   SpO2 94%   BMI 30.24 kg/m   Physical Exam Vitals and nursing note reviewed.  Constitutional:      General: She is not in acute distress.    Appearance: She is well-developed. She is not diaphoretic.  HENT:     Head: Normocephalic and atraumatic.  Cardiovascular:     Rate and Rhythm: Normal rate and regular rhythm.     Heart sounds: No murmur heard.   No friction rub. No gallop.  Pulmonary:     Effort: Pulmonary effort is normal. No respiratory distress.     Breath sounds: Normal breath sounds. No wheezing.  Abdominal:     General: Bowel sounds are normal. There is no distension.     Palpations: Abdomen is soft.     Tenderness: There is no abdominal tenderness.  Musculoskeletal:        General: Normal range of motion.     Cervical back: Normal range of motion and neck supple.     Right lower leg: No tenderness. No edema.     Left lower leg: No tenderness. No edema.  Skin:    General: Skin is warm and dry.  Neurological:     General: No focal deficit present.      Mental Status: She is alert and oriented to person, place, and time.    ED Results / Procedures / Treatments   Labs (all labs ordered are listed, but only abnormal results are displayed) Labs Reviewed  COMPREHENSIVE METABOLIC PANEL - Abnormal; Notable for the following components:      Result Value   Glucose, Bld  133 (*)    All other components within normal limits  TROPONIN I (HIGH SENSITIVITY) - Abnormal; Notable for the following components:   Troponin I (High Sensitivity) 72 (*)    All other components within normal limits  CBC WITH DIFFERENTIAL/PLATELET  TROPONIN I (HIGH SENSITIVITY)    EKG ED ECG REPORT   Date: 09/01/2020  Rate: 64  Rhythm: normal sinus rhythm  QRS Axis: normal  Intervals: normal  ST/T Wave abnormalities: normal  Conduction Disutrbances:none  Narrative Interpretation:   Old EKG Reviewed: unchanged  I have personally reviewed the EKG tracing and agree with the computerized printout as noted.     Radiology DG Chest 2 View  Result Date: 08/31/2020 CLINICAL DATA:  Chest pain EXAM: CHEST - 2 VIEW COMPARISON:  02/17/2018 FINDINGS: Increased markings in the medial right lung base could reflect pneumonia. This is noted posteriorly on the lateral view. Left lung clear. Heart is normal size. No effusions or acute bony abnormality. IMPRESSION: Right medial posterior basilar opacity concerning for pneumonia. Electronically Signed   By: Rolm Baptise M.D.   On: 08/31/2020 22:03    Procedures Procedures   Medications Ordered in ED Medications  aspirin chewable tablet 324 mg (has no administration in time range)  nitroGLYCERIN (NITROSTAT) SL tablet 0.4 mg (has no administration in time range)    ED Course  I have reviewed the triage vital signs and the nursing notes.  Pertinent labs & imaging results that were available during my care of the patient were reviewed by me and considered in my medical decision making (see chart for details).    MDM  Rules/Calculators/A&P  Patient is a 80 year old female with no prior cardiac history.  She presents today with complaints of chest discomfort.  Patient's EKG is unremarkable, but initial troponin was 72, then repeat was nearly 1100.  Patient received aspirin and nitroglycerin and has now been started on a heparin drip.  Care discussed with Dr. Renella Cunas from cardiology and patient to be admitted to the cardiology service for further work-up.  CRITICAL CARE Performed by: Veryl Speak Total critical care time: 40 minutes Critical care time was exclusive of separately billable procedures and treating other patients. Critical care was necessary to treat or prevent imminent or life-threatening deterioration. Critical care was time spent personally by me on the following activities: development of treatment plan with patient and/or surrogate as well as nursing, discussions with consultants, evaluation of patient's response to treatment, examination of patient, obtaining history from patient or surrogate, ordering and performing treatments and interventions, ordering and review of laboratory studies, ordering and review of radiographic studies, pulse oximetry and re-evaluation of patient's condition.   Final Clinical Impression(s) / ED Diagnoses Final diagnoses:  None    Rx / DC Orders ED Discharge Orders     None        Veryl Speak, MD 09/01/20 [phone]

## 2020-09-01 NOTE — Interval H&P Note (Signed)
Cath Lab Visit (complete for each Cath Lab visit)  Clinical Evaluation Leading to the Procedure:   ACS: Yes.    Non-ACS:    Anginal Classification: CCS III  Anti-ischemic medical therapy: Maximal Therapy (2 or more classes of medications)  Non-Invasive Test Results: No non-invasive testing performed  Prior CABG: No previous CABG      History and Physical Interval Note:  09/01/2020 2:47 PM  Jessica [name]  has presented today for surgery, with the diagnosis of nstemi.  The various methods of treatment have been discussed with the patient and family. After consideration of risks, benefits and other options for treatment, the patient has consented to  Procedure(s): LEFT HEART CATH AND CORONARY ANGIOGRAPHY (N/A) as a surgical intervention.  The patient's history has been reviewed, patient examined, no change in status, stable for surgery.  I have reviewed the patient's chart and labs.  Questions were answered to the patient's satisfaction.     Belva Crome III

## 2020-09-01 NOTE — H&P (View-Only) (Signed)
Progress Note  Patient Name: Jessica [name] Date of Encounter: 09/01/2020  [place] Cardiologist: None   Subjective   No chest pain this morning.   Inpatient Medications    Scheduled Meds:  vitamin C  500 mg Oral Daily   aspirin EC  81 mg Oral Daily   calcium-vitamin D  2 tablet Oral Daily   cholecalciferol  2,000 Units Oral Daily   levothyroxine  125 mcg Oral QAC breakfast   Continuous Infusions:  heparin 800 Units/hr (09/01/20 0600)   PRN Meds: acetaminophen, nitroGLYCERIN, ondansetron (ZOFRAN) IV   Vital Signs    Vitals:   09/01/20 0400 09/01/20 0500 09/01/20 0515 09/01/20 0607  BP: 118/82 116/75  132/86  Pulse: 62 64  65  Resp: 13 15  16   Temp:    98 F (36.7 C)  TempSrc:    Oral  SpO2: 96% 93% 94% 97%  Weight:    69.8 kg  Height:    5\' 2"  (1.575 m)    Intake/Output Summary (Last 24 hours) at 09/01/2020 0724 Last data filed at 09/01/2020 0600 Gross per 24 hour  Intake 62.11 ml  Output --  Net 62.11 ml   Last 3 Weights 09/01/2020 08/31/2020 07/29/2020  Weight (lbs) 153 lb 14.1 oz 165 lb 5.5 oz 155 lb 9.6 oz  Weight (kg) 69.8 kg 75 kg 70.58 kg      Telemetry    SR rates-> 60s - Personally Reviewed  ECG    SR with TWI in v1-v2 (unchanged from prior) - Personally Reviewed  Physical Exam   GEN: No acute distress.   Neck: No JVD Cardiac: RRR, no murmurs, rubs, or gallops.  Respiratory: Clear to auscultation bilaterally. GI: Soft, nontender, non-distended  MS: No edema; No deformity. Neuro:  Nonfocal  Psych: Normal affect   Labs    High Sensitivity Troponin:   Recent Labs  Lab 08/31/20 2133 08/31/20 2333  TROPONINIHS 72* 1,077*      Chemistry Recent Labs  Lab 08/31/20 2133  NA 136  K 4.2  CL 103  CO2 25  GLUCOSE 133*  BUN 19  CREATININE 0.62  CALCIUM 9.0  PROT 6.7  ALBUMIN 4.0  AST 21  ALT 19  ALKPHOS 53  BILITOT 0.6  GFRNONAA >60  ANIONGAP 8     Hematology Recent Labs  Lab 08/31/20 2133  WBC 10.2  RBC  4.24  HGB 13.8  HCT 42.3  MCV 99.8  MCH 32.5  MCHC 32.6  RDW 12.4  PLT 280    BNPNo results for input(s): BNP, PROBNP in the last 168 hours.   DDimer No results for input(s): DDIMER in the last 168 hours.   Radiology    DG Chest 2 View  Result Date: 08/31/2020 CLINICAL DATA:  Chest pain EXAM: CHEST - 2 VIEW COMPARISON:  02/17/2018 FINDINGS: Increased markings in the medial right lung base could reflect pneumonia. This is noted posteriorly on the lateral view. Left lung clear. Heart is normal size. No effusions or acute bony abnormality. IMPRESSION: Right medial posterior basilar opacity concerning for pneumonia. Electronically Signed   By: Rolm Baptise M.D.   On: 08/31/2020 22:03    Cardiac Studies   Echo: pending  Patient Profile     80 y.o. female with PMH of hypothyroidism, HLD and prior pericarditis who presented with chest pain and found to have elevated troponin.   Assessment & Plan    NSTEMI: hsTn rose from 72>>1077. EKG with no acute ischemia. Placed  on IV heparin with plans for cardiac cath. No chest pain overnight.  -- echo pending -- The patient understands that risks included but are not limited to stroke (1 in 1000), death (1 in 103), kidney failure [usually temporary] (1 in 500), bleeding (1 in 200), allergic reaction [possibly serious] (1 in 200).    HLD: LDL 185, HDL 56 (5/22). Question whether she had statin intolerant in the past, but reports she has just not wanted to take a statin. -- started on crestor 20mg  daily on admission. Will increase to 40mg  daily.  -- check lipids   Hypothyroidism: continue synthroid  For questions or updates, please contact [place] Please consult [email or web address] for contact info under      Signed, Reino Bellis, NP  09/01/2020, 7:24 AM    Patient seen, examined. Available data reviewed. Agree with findings, assessment, and plan as outlined by Reino Bellis, NP.   On my exam: Vitals:   09/01/20 0607 09/01/20  0823  BP: 132/86 104/63  Pulse: 65 60  Resp: 16 18  Temp: 98 F (36.7 C) 98.1 F (36.7 C)  SpO2: 97% 96%   Pt is alert and oriented, pleasant elderly woman in NAD HEENT: normal Neck: JVP - normal Lungs: CTA bilaterally CV: RRR without murmur or gallop Abd: soft, NT, Positive BS, no hepatomegaly Ext: no C/C/E, distal pulses intact and equal Skin: warm/dry no rash  The patient presents with typical symptoms of acute coronary syndrome and is ruled in for non-STEMI with a troponin that has trended from 72 up to 1077.  She is chest pain-free overnight.  It sounds like she had a remote cardiac catheterization years ago when she had pericarditis, but otherwise has no significant cardiac history.  She does have untreated hyperlipidemia.  Fortunately, she has been chest pain-free overnight on IV heparin.  Cardiac catheterization and possible PCI are clinically indicated.  She has no history of contrast allergy or bleeding problems. I have reviewed the risks, indications, and alternatives to cardiac catheterization, possible angioplasty, and stenting with the patient. Risks include but are not limited to bleeding, infection, vascular injury, stroke, myocardial infection, arrhythmia, kidney injury, radiation-related injury in the case of prolonged fluoroscopy use, emergency cardiac surgery, and death. The patient understands the risks of serious complication is 1-2 in 8889 with diagnostic cardiac cath and 1-2% or less with angioplasty/stenting.    Sherren Mocha, M.D. 09/01/2020 8:58 AM

## 2020-09-01 NOTE — H&P (Addendum)
Cardiology Admission History and Physical:   Patient ID: Jessica [name] MRN: [id number]; DOB: 1941/02/17   Admission date: 08/31/2020  Primary Care Provider: Virgie Dad, MD [facility] Cardiologist: None  [facility] Electrophysiologist:  None   Chief Complaint: chest pressure   Patient Profile:   Jessica [name] is a 80 y.o. female with hypothyroidism, HLD, and prior pericarditis who presents with acute onset chest pressure.   History of Present Illness:   Jessica [name] reported that Sunday afternoon she developed new onset central and nonradiating chest pressure that was moderate in severity and lasted for several minutes before going away.  She thinks that she was reading a book at home but does not remember for sure what she is doing during that time.  She lives by herself in an independent living apartment and takes care of all of her ADLs.  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  She reports that she had 3 episodes of pericarditis but has had none since then.  Monday afternoon she was sitting down and had the similar sensation of chest pressure however this time it did go away on its own.  She initially went to an urgent care for further evaluation who directed her to the emergency department given her symptoms. She was not doing anything during that time that would have caused her symptoms.  Unfortunately her symptoms were persistent until she came to the emergency department and had Nitropaste applied.  She had significant relief with Nitropaste which took the pain from a 7/10-3/10 severity.  She did have diaphoresis during her episode of chest pressure.  She denies any SOB, DOE, palpitations, lower extremity edema.  She has no history of premature CAD or SCD.  Her only risk factor for CAD is longstanding uncontrolled hyperlipidemia (LDL 130-180s all the way back to 2008).   Past Medical History:  Diagnosis Date   Anemia  12/2015   Bronchitis, chronic (HCC)    in her 39s   Cataract    Chronic kidney disease    in 20s had nephritis   Closed fracture of right distal humerus    right   Hearing loss 05/03/2016   High cholesterol    Hypothyroidism    Osteopenia    Pericarditis 01/1999   Pneumothorax 01/1999   Thyroid disorder    Vitamin B 12 deficiency    Past Surgical History:  Procedure Laterality Date   birthmark      removed,left leg   CATARACT EXTRACTION  2017   ORIF HUMERUS FRACTURE Right 04/10/2019   Procedure: RIGHT OPEN REDUCTION INTERNAL FIXATION (ORIF) DISTAL HUMERUS FRACTURE WITH EXTENSION;  Surgeon: Hiram Gash, MD;  Location: [place];  Service: Orthopedics;  Laterality: Right;   [place]?    Medications Prior to Admission: Prior to Admission medications   Medication Sig Start Date End Date Taking? Authorizing Provider  acetaminophen (TYLENOL) 650 MG CR tablet Take 1,300 mg by mouth every 8 (eight) hours as needed for pain.   Yes [provider]  alendronate (FOSAMAX) 70 MG tablet TAKE 1 TABLET BY MOUTH EVERY 7 DAYS WITH A FULL GLASS OF WATER AND ON AN EMPTY STOMACH Patient taking differently: Take 70 mg by mouth every Saturday at 6 PM. 07/31/20  Yes Virgie Dad, MD  Calcium Carb-Cholecalciferol (CALCIUM 600+D) 600-800 MG-UNIT TABS Take 2 tablets by mouth daily.   Yes [provider]  cholecalciferol (VITAMIN D)  1000 UNITS tablet Take 2,000 Units by mouth daily.    Yes [provider]  FERROUS SULFATE PO Take 1 tablet by mouth daily.   Yes [provider]  l-methylfolate-B6-B12 (METANX) 3-35-2 MG TABS tablet Take 1 tablet by mouth daily.   Yes [provider]  levothyroxine (SYNTHROID) 125 MCG tablet TAKE 1 TABLET BY MOUTH  DAILY Patient taking differently: Take 125 mcg by mouth daily before breakfast. 04/02/20  Yes Virgie Dad, MD  Red Yeast Rice 600 MG TABS Take 600 mg by mouth daily.   Yes  [provider]  simethicone (GAS-X) 80 MG chewable tablet Chew 1 tablet (80 mg total) by mouth every 6 (six) hours as needed for flatulence. 04/20/20  Yes Ngetich, Dinah C, NP  vitamin C (ASCORBIC ACID) 500 MG tablet Take 500 mg by mouth daily.   Yes [provider]  calcium carbonate (OS-CAL) 1250 (500 Ca) MG chewable tablet Chew 1 tablet (1,250 mg total) by mouth daily. Patient not taking: Reported on 09/01/2020 09/11/17   Blanchie Serve, MD    Allergies:    Allergies  Allergen Reactions   Atorvastatin     Muscle weakness   Rosuvastatin     Muscle weaknes   Social History:   Social History   Socioeconomic History   Marital status: Divorced    Spouse name: Not on file   Number of children: 1   Years of education: college   Highest education level: Not on file  Occupational History   Occupation: retired  Tobacco Use   Smoking status: Never   Smokeless tobacco: Never  Vaping Use   Vaping Use: Never used  Substance and Sexual Activity   Alcohol use: Yes    Alcohol/week: 3.0 standard drinks    Types: 3 Glasses of wine per week    Comment: social   Drug use: No   Sexual activity: Not on file  Other Topics Concern   Not on file  Social History Narrative   Moved to [facility] 01/14/2015   Do you drink/eat things with caffeine?  yes   Marital status?      divorced                               Do you live in a house, apartment, assisted living, condo, trailer, etc.?  Retirement community   Is it one or more stories? Yes, 3   How many persons live in your home?  1   Do you have any pets in your home? (please list) 0   Current or past profession:  Librarian   Do you exercise?     yes                                 Type & how often? Walk-varies   Do you have a living will? yes   Do you have a DNR form?         no                         If not, do you want to discuss one? yes   Do you have signed POA/HPOA for forms? yes   Social Determinants of Health    Financial Resource Strain: Not on file  Food Insecurity: Not on file  Transportation Needs: Not on  file  Physical Activity: Not on file  Stress: Not on file  Social Connections: Not on file  Intimate Partner Violence: Not on file    Family History:   The patient's family history includes Arthritis in her paternal grandmother; Asthma in her mother; Ataxia in her father; Cancer - Cervical in her sister; Diabetes in her maternal grandmother, mother, and another family member; Stroke in her father; Thyroid disease in her sister. There is no history of Colon cancer.    ROS:   Review of Systems: [y] = yes, [ ]  = no   General: Weight gain [ ] ; Weight loss [ ] ; Anorexia [ ] ; Fatigue [ ] ; Fever [ ] ; Chills [ ] ; Weakness [ ]    Cardiac: Chest pain/pressure [y]; Resting SOB [ ] ; Exertional SOB [ ] ; Orthopnea [ ] ; Pedal Edema [ ] ; Palpitations [ ] ; Syncope [ ] ; Presyncope [ ] ; Paroxysmal nocturnal dyspnea [ ]    Pulmonary: Cough [ ] ; Wheezing [ ] ; Hemoptysis [ ] ; Sputum [ ] ; Snoring [ ]    GI: Vomiting [ ] ; Dysphagia [ ] ; Melena [ ] ; Hematochezia [ ] ; Heartburn [ ] ; Abdominal pain [ ] ; Constipation [ ] ; Diarrhea [ ] ; BRBPR [ ]    GU: Hematuria [ ] ; Dysuria [ ] ; Nocturia [ ]  Vascular: Pain in legs with walking [ ] ; Pain in feet with lying flat [ ] ; Non-healing sores [ ] ; Stroke [ ] ; TIA [ ] ; Slurred speech [ ] ;   Neuro: Headaches [ ] ; Vertigo [ ] ; Seizures [ ] ; Paresthesias [ ] ;Blurred vision [ ] ; Diplopia [ ] ; Vision changes [ ]    Ortho/Skin: Arthritis [ ] ; Joint pain [ ] ; Muscle pain [ ] ; Joint swelling [ ] ; Back Pain [ ] ; Rash [ ]    Psych: Depression [ ] ; Anxiety [ ]    Heme: Bleeding problems [ ] ; Clotting disorders [ ] ; Anemia [ ]    Endocrine: Diabetes [ ] ; Thyroid dysfunction [ ]    Physical Exam/Data:   Vitals:   09/01/20 0015 09/01/20 0045 09/01/20 0100 09/01/20 0115  BP: (!) 153/77 (!) 145/66 128/77 138/81  Pulse: 63 64 67 (!) 56  Resp: 16 13 15 12   Temp:      TempSrc:      SpO2: 93% 95%  95% 95%  Weight:      Height:       No intake or output data in the 24 hours ending 09/01/20 0146 Last 3 Weights 08/31/2020 07/29/2020 04/20/2020  Weight (lbs) 165 lb 5.5 oz 155 lb 9.6 oz 156 lb 12.8 oz  Weight (kg) 75 kg 70.58 kg 71.124 kg     Body mass index is 30.24 kg/m.  General:  Well nourished, well developed, in no acute distress HEENT: normal Lymph: no adenopathy Neck: no JVD Endocrine:  No thryomegaly Vascular: No carotid bruits; FA pulses 2+ bilaterally without bruits  Cardiac:  normal S1, S2; RRR; no murmur  Lungs:  clear to auscultation bilaterally, no wheezing, rhonchi or rales  Abd: soft, nontender, no hepatomegaly  Ext: no LE edema Musculoskeletal:  No deformities, BUE and BLE strength normal and equal Skin: warm and dry  Neuro:  CNs 2-12 intact, no focal abnormalities noted Psych:  Normal affect   EKG:  The ECG that was done was personally reviewed and demonstrates NSR without ischemic changes   Relevant CV Studies: None   Laboratory Data:  High Sensitivity Troponin:   Recent Labs  Lab 08/31/20 2133 08/31/20 2333  TROPONINIHS 72* 1,077*  Chemistry Recent Labs  Lab 08/31/20 2133  NA 136  K 4.2  CL 103  CO2 25  GLUCOSE 133*  BUN 19  CREATININE 0.62  CALCIUM 9.0  GFRNONAA >60  ANIONGAP 8    Recent Labs  Lab 08/31/20 2133  PROT 6.7  ALBUMIN 4.0  AST 21  ALT 19  ALKPHOS 53  BILITOT 0.6   Hematology Recent Labs  Lab 08/31/20 2133  WBC 10.2  RBC 4.24  HGB 13.8  HCT 42.3  MCV 99.8  MCH 32.5  MCHC 32.6  RDW 12.4  PLT 280   BNPNo results for input(s): BNP, PROBNP in the last 168 hours.  DDimer No results for input(s): DDIMER in the last 168 hours.  Radiology/Studies:  DG Chest 2 View  Result Date: 08/31/2020 CLINICAL DATA:  Chest pain EXAM: CHEST - 2 VIEW COMPARISON:  02/17/2018 FINDINGS: Increased markings in the medial right lung base could reflect pneumonia. This is noted posteriorly on the lateral view. Left lung clear.  Heart is normal size. No effusions or acute bony abnormality. IMPRESSION: Right medial posterior basilar opacity concerning for pneumonia. Electronically Signed   By: Rolm Baptise M.D.   On: 08/31/2020 22:03    TIMI Risk Score for Unstable Angina or Non-ST Elevation MI:   The patient's TIMI risk score is 3, which indicates a 13% risk of all cause mortality, new or recurrent myocardial infarction or need for urgent revascularization in the next 14 days.{  Assessment and Plan:   NSTEMI Ms. Hunnell presents with story concerning for ACS without ECG abnormalities but with a significant delta troponin.  She has no other etiology to explain her significant uptrending troponin so I think it would be most reasonable to go ahead and pursue coronary evaluation today for scheduled events.  She has chronically elevated LDL and has not been on a statin secondary to mild myalgias in the past.  - s/p ASA 324 mg PO x1 (0047) - s/p heparin gtt - start ASA 81 mg PO daily - start crestor 20 mg PO qhs, discussed listed allergy-only mild myalgias in the past - TTE today - coornary evalaution today - BP/HR borderline, start ACEi/BB following cath  - A1c pending, LDL (185) on 07/23/20  Hypothyroidism - continue synthroid 125 mcg qAM  - TSH last month (07/23/20) 1.08  HLD - start crestor 20 mg PO qhs   Severity of Illness: The appropriate patient status for this patient is INPATIENT. Inpatient status is judged to be reasonable and necessary in order to provide the required intensity of service to ensure the patient's safety. The patient's presenting symptoms, physical exam findings, and initial radiographic and laboratory data in the context of their chronic comorbidities is felt to place them at high risk for further clinical deterioration. Furthermore, it is not anticipated that the patient will be medically stable for discharge from the hospital within 2 midnights of admission. The following factors support  the patient status of inpatient.   " The patient's presenting symptoms include chest pressure " The worrisome physical exam findings include chest pressure " The initial radiographic and laboratory data are worrisome because of elevated troponin  " The chronic co-morbidities include hypothyroidism, HLD   * I certify that at the point of admission it is my clinical judgment that the patient will require inpatient hospital care spanning beyond 2 midnights from the point of admission due to high intensity of service, high risk for further deterioration and high frequency of surveillance required.*  For questions or updates, please contact [place] Please consult [email or web address] for contact info under     Signed, Dion Body, MD  09/01/2020 1:46 AM

## 2020-09-01 NOTE — Progress Notes (Addendum)
Progress Note  Patient Name: Jessica [name] Date of Encounter: 09/01/2020  [place] Cardiologist: None   Subjective   No chest pain this morning.   Inpatient Medications    Scheduled Meds:  vitamin C  500 mg Oral Daily   aspirin EC  81 mg Oral Daily   calcium-vitamin D  2 tablet Oral Daily   cholecalciferol  2,000 Units Oral Daily   levothyroxine  125 mcg Oral QAC breakfast   Continuous Infusions:  heparin 800 Units/hr (09/01/20 0600)   PRN Meds: acetaminophen, nitroGLYCERIN, ondansetron (ZOFRAN) IV   Vital Signs    Vitals:   09/01/20 0400 09/01/20 0500 09/01/20 0515 09/01/20 0607  BP: 118/82 116/75  132/86  Pulse: 62 64  65  Resp: 13 15  16   Temp:    98 F (36.7 C)  TempSrc:    Oral  SpO2: 96% 93% 94% 97%  Weight:    69.8 kg  Height:    5\' 2"  (1.575 m)    Intake/Output Summary (Last 24 hours) at 09/01/2020 0724 Last data filed at 09/01/2020 0600 Gross per 24 hour  Intake 62.11 ml  Output --  Net 62.11 ml   Last 3 Weights 09/01/2020 08/31/2020 07/29/2020  Weight (lbs) 153 lb 14.1 oz 165 lb 5.5 oz 155 lb 9.6 oz  Weight (kg) 69.8 kg 75 kg 70.58 kg      Telemetry    SR rates-> 60s - Personally Reviewed  ECG    SR with TWI in v1-v2 (unchanged from prior) - Personally Reviewed  Physical Exam   GEN: No acute distress.   Neck: No JVD Cardiac: RRR, no murmurs, rubs, or gallops.  Respiratory: Clear to auscultation bilaterally. GI: Soft, nontender, non-distended  MS: No edema; No deformity. Neuro:  Nonfocal  Psych: Normal affect   Labs    High Sensitivity Troponin:   Recent Labs  Lab 08/31/20 2133 08/31/20 2333  TROPONINIHS 72* 1,077*      Chemistry Recent Labs  Lab 08/31/20 2133  NA 136  K 4.2  CL 103  CO2 25  GLUCOSE 133*  BUN 19  CREATININE 0.62  CALCIUM 9.0  PROT 6.7  ALBUMIN 4.0  AST 21  ALT 19  ALKPHOS 53  BILITOT 0.6  GFRNONAA >60  ANIONGAP 8     Hematology Recent Labs  Lab 08/31/20 2133  WBC 10.2  RBC  4.24  HGB 13.8  HCT 42.3  MCV 99.8  MCH 32.5  MCHC 32.6  RDW 12.4  PLT 280    BNPNo results for input(s): BNP, PROBNP in the last 168 hours.   DDimer No results for input(s): DDIMER in the last 168 hours.   Radiology    DG Chest 2 View  Result Date: 08/31/2020 CLINICAL DATA:  Chest pain EXAM: CHEST - 2 VIEW COMPARISON:  02/17/2018 FINDINGS: Increased markings in the medial right lung base could reflect pneumonia. This is noted posteriorly on the lateral view. Left lung clear. Heart is normal size. No effusions or acute bony abnormality. IMPRESSION: Right medial posterior basilar opacity concerning for pneumonia. Electronically Signed   By: Rolm Baptise M.D.   On: 08/31/2020 22:03    Cardiac Studies   Echo: pending  Patient Profile     80 y.o. female with PMH of hypothyroidism, HLD and prior pericarditis who presented with chest pain and found to have elevated troponin.   Assessment & Plan    NSTEMI: hsTn rose from 72>>1077. EKG with no acute ischemia. Placed  on IV heparin with plans for cardiac cath. No chest pain overnight.  -- echo pending -- The patient understands that risks included but are not limited to stroke (1 in 1000), death (1 in 24), kidney failure [usually temporary] (1 in 500), bleeding (1 in 200), allergic reaction [possibly serious] (1 in 200).    HLD: LDL 185, HDL 56 (5/22). Question whether she had statin intolerant in the past, but reports she has just not wanted to take a statin. -- started on crestor 20mg  daily on admission. Will increase to 40mg  daily.  -- check lipids   Hypothyroidism: continue synthroid  For questions or updates, please contact [place] Please consult [email or web address] for contact info under      Signed, Reino Bellis, NP  09/01/2020, 7:24 AM    Patient seen, examined. Available data reviewed. Agree with findings, assessment, and plan as outlined by Reino Bellis, NP.   On my exam: Vitals:   09/01/20 0607 09/01/20  0823  BP: 132/86 104/63  Pulse: 65 60  Resp: 16 18  Temp: 98 F (36.7 C) 98.1 F (36.7 C)  SpO2: 97% 96%   Pt is alert and oriented, pleasant elderly woman in NAD HEENT: normal Neck: JVP - normal Lungs: CTA bilaterally CV: RRR without murmur or gallop Abd: soft, NT, Positive BS, no hepatomegaly Ext: no C/C/E, distal pulses intact and equal Skin: warm/dry no rash  The patient presents with typical symptoms of acute coronary syndrome and is ruled in for non-STEMI with a troponin that has trended from 72 up to 1077.  She is chest pain-free overnight.  It sounds like she had a remote cardiac catheterization years ago when she had pericarditis, but otherwise has no significant cardiac history.  She does have untreated hyperlipidemia.  Fortunately, she has been chest pain-free overnight on IV heparin.  Cardiac catheterization and possible PCI are clinically indicated.  She has no history of contrast allergy or bleeding problems. I have reviewed the risks, indications, and alternatives to cardiac catheterization, possible angioplasty, and stenting with the patient. Risks include but are not limited to bleeding, infection, vascular injury, stroke, myocardial infection, arrhythmia, kidney injury, radiation-related injury in the case of prolonged fluoroscopy use, emergency cardiac surgery, and death. The patient understands the risks of serious complication is 1-2 in 6384 with diagnostic cardiac cath and 1-2% or less with angioplasty/stenting.    Sherren Mocha, M.D. 09/01/2020 8:58 AM

## 2020-09-01 NOTE — ED Notes (Signed)
Attempted report for pt going to 6E30. RN unavailable at this time. Provided call back number [phone]

## 2020-09-02 ENCOUNTER — Other Ambulatory Visit (HOSPITAL_COMMUNITY): Payer: Self-pay

## 2020-09-02 ENCOUNTER — Encounter (HOSPITAL_COMMUNITY): Payer: Self-pay | Admitting: Interventional Cardiology

## 2020-09-02 ENCOUNTER — Other Ambulatory Visit: Payer: Self-pay | Admitting: Cardiology

## 2020-09-02 ENCOUNTER — Telehealth: Payer: Self-pay | Admitting: Physician Assistant

## 2020-09-02 DIAGNOSIS — Z79899 Other long term (current) drug therapy: Secondary | ICD-10-CM

## 2020-09-02 LAB — BASIC METABOLIC PANEL
Anion gap: 7 (ref 5–15)
BUN: 6 mg/dL — ABNORMAL LOW (ref 8–23)
CO2: 23 mmol/L (ref 22–32)
Calcium: 8.2 mg/dL — ABNORMAL LOW (ref 8.9–10.3)
Chloride: 108 mmol/L (ref 98–111)
Creatinine, Ser: 0.5 mg/dL (ref 0.44–1.00)
GFR, Estimated: >60 mL/min (ref >60)
Glucose, Bld: 89 mg/dL (ref 70–99)
Potassium: 3.2 mmol/L — ABNORMAL LOW (ref 3.5–5.1)
Sodium: 138 mmol/L (ref 135–145)

## 2020-09-02 LAB — CBC
HCT: 37 % (ref 36.0–46.0)
Hemoglobin: 12.5 g/dL (ref 12.0–15.0)
MCH: 32.8 pg (ref 26.0–34.0)
MCHC: 33.8 g/dL (ref 30.0–36.0)
MCV: 97.1 fL (ref 80.0–100.0)
Platelets: 217 10*3/uL (ref 150–400)
RBC: 3.81 MIL/uL — ABNORMAL LOW (ref 3.87–5.11)
RDW: 12.4 % (ref 11.5–15.5)
WBC: 11.4 10*3/uL — ABNORMAL HIGH (ref 4.0–10.5)
nRBC: 0 % (ref 0.0–0.2)

## 2020-09-02 LAB — HEMOGLOBIN A1C
Hgb A1c MFr Bld: 5.7 % — ABNORMAL HIGH (ref 4.8–5.6)
Mean Plasma Glucose: 117 mg/dL

## 2020-09-02 MED ORDER — METOPROLOL SUCCINATE ER 25 MG PO TB24
12.5000 mg | ORAL_TABLET | Freq: Every day | ORAL | 0 refills | Status: DC
  Filled 2020-09-02: qty 15, 30d supply, fill #0

## 2020-09-02 MED ORDER — SPIRONOLACTONE 25 MG PO TABS
12.5000 mg | ORAL_TABLET | Freq: Every day | ORAL | 0 refills | Status: DC
  Filled 2020-09-02: qty 15, 30d supply, fill #0

## 2020-09-02 MED ORDER — ROSUVASTATIN CALCIUM 40 MG PO TABS
40.0000 mg | ORAL_TABLET | Freq: Every day | ORAL | 1 refills | Status: DC
  Filled 2020-09-02: qty 30, 30d supply, fill #0

## 2020-09-02 MED ORDER — METOPROLOL SUCCINATE ER 25 MG PO TB24
12.5000 mg | ORAL_TABLET | Freq: Every day | ORAL | Status: DC
  Administered 2020-09-02: 12.5 mg via ORAL
  Filled 2020-09-02: qty 1

## 2020-09-02 MED ORDER — POTASSIUM CHLORIDE CRYS ER 20 MEQ PO TBCR
60.0000 meq | EXTENDED_RELEASE_TABLET | Freq: Once | ORAL | Status: AC
  Administered 2020-09-02: 60 meq via ORAL
  Filled 2020-09-02: qty 3

## 2020-09-02 MED ORDER — NITROGLYCERIN 0.4 MG SL SUBL
0.4000 mg | SUBLINGUAL_TABLET | SUBLINGUAL | 2 refills | Status: DC | PRN
  Filled 2020-09-02: qty 25, 8d supply, fill #0

## 2020-09-02 MED ORDER — TICAGRELOR 90 MG PO TABS
90.0000 mg | ORAL_TABLET | Freq: Two times a day (BID) | ORAL | 2 refills | Status: DC
  Filled 2020-09-02: qty 60, 30d supply, fill #0

## 2020-09-02 MED ORDER — SPIRONOLACTONE 12.5 MG HALF TABLET
12.5000 mg | ORAL_TABLET | Freq: Every day | ORAL | Status: DC
  Administered 2020-09-02: 12.5 mg via ORAL
  Filled 2020-09-02: qty 1

## 2020-09-02 MED ORDER — ASPIRIN 81 MG PO CHEW
81.0000 mg | CHEWABLE_TABLET | Freq: Every day | ORAL | 2 refills | Status: DC
  Filled 2020-09-02: qty 90, 90d supply, fill #0

## 2020-09-02 MED FILL — Heparin Sod (Porcine)-NaCl IV Soln 1000 Unit/500ML-0.9%: INTRAVENOUS | Qty: 1000 | Status: AC

## 2020-09-02 NOTE — Discharge Summary (Signed)
Discharge Summary    Patient ID: KAHLA RISDON MRN: [id number]; DOB: Aug 03, 1940  Admit date: 08/31/2020 Discharge date: 09/02/2020  PCP:  Virgie Dad, MD   [facility] Providers Cardiologist:  Sherren Mocha, MD     Discharge Diagnoses    Principal Problem:   NSTEMI (non-ST elevated myocardial infarction) [facility]) Active Problems:   HYPERLIPIDEMIA   Hypothyroidism    Diagnostic Studies/Procedures    Cath: 09/01/20   Subtotal occlusion of the mid LAD with distal TIMI grade II flow.  Stenosis is a Medina 010 bifurcation stenosis with the first diagonal. Successful angioplasty and stenting using a 22 x 2.0 mm Onyx postdilated to 2.25 mm in diameter, avoiding jailing the larger first diagonal.  0% stenosis with TIMI grade III flow was noted. Tandem 50 and 40% proximal and mid RCA stenoses.  Right coronary is codominant. Left main is widely patent.   Circumflex is widely patent.  The circumflex and obtuse marginals are tortuous. Apical severe hypokinesis/akinesis.  LVEDP 6 mmHg.     RECOMMENDATIONS:   Aspirin and Brilinta for 12 months. Aggressive risk factor modification High intensity statin therapy   Diagnostic Dominance: Co-dominant    Intervention      Echo: 09/01/20  IMPRESSIONS     1. Left ventricular ejection fraction, by estimation, is 40 to 45%. The  left ventricle has mildly decreased function. The left ventricle  demonstrates regional wall motion abnormalities (see scoring  diagram/findings for description). Left ventricular  diastolic parameters are consistent with Grade I diastolic dysfunction  (impaired relaxation). There is severe hypokinesis of the left  ventricular, apical anteroseptal wall, anterior wall, inferoseptal wall  and apical segment.   2. Right ventricular systolic function is normal. The right ventricular  size is normal.   3. The mitral valve is normal in structure. Mild mitral valve  regurgitation. No evidence of  mitral stenosis.   4. The aortic valve is normal in structure. Aortic valve regurgitation is  trivial. Mild aortic valve sclerosis is present, with no evidence of  aortic valve stenosis.   5. The inferior vena cava is normal in size with greater than 50%  respiratory variability, suggesting right atrial pressure of 3 mmHg.  _____________   History of Present Illness     Jessica [name] is a 80 y.o. female with hypothyroidism, HLD, and prior pericarditis who presented with acute onset chest pressure. Ms. Masley reported that Sunday afternoon prior to admission she developed new onset central and nonradiating chest pressure that was moderate in severity and lasted for several minutes before going away. She lives by herself in an independent living apartment and takes care of all of her ADLs.  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  She reported that she had 3 episodes of pericarditis but has had none since then.  The afternoon of admission she was sitting down and had the similar sensation of chest pressure however this time it did go away on its own.  She initially went to an urgent care for further evaluation who directed her to the emergency department given her symptoms. She was not doing anything during that time that would have caused her symptoms.  Unfortunately her symptoms were persistent until she came to the emergency department and had Nitropaste applied.  She had significant relief with Nitropaste which took the pain from a 7/10-3/10 severity.  She did have diaphoresis during her episode of chest pressure.  She denies any SOB, DOE,  palpitations, lower extremity edema.  She has no history of premature CAD or SCD.  Her only risk factor for CAD is longstanding uncontrolled hyperlipidemia (LDL 130-180s all the way back to 2008).    Hospital Course     NSTEMI: hsTn rose from 72>>1077. EKG with no acute ischemia. Placed on IV heparin and  underwent cardiac cath noted above.  Subtotal occlusion of the mLAD with distal TIMI II flow, successful angioplasty and DES to mLAD, avoided jailing the larger 1st OM. Does have residual 50 and 40% p/mRCA lesions in codominant RCA. Plan for DAPT with ASA/Brilinta for one year. Worked with CR prior to discharge without recurrent chest pain.    HLD: LDL 185, HDL 56 (5/22). Question whether she had statin intolerant in the past, but reports she has just not wanted to take a statin, did have mild muscle aches. -- started on crestor 20mg  daily on admission. Increased to 40mg  daily which she is willing to try -- check FLP/LFTs in 8 weeks -- may need lipid clinic referral pending response to Crestor  ICM: EF noted at 40-45% with G1DD, hypokinesis of the LV, apical anteroseptal, anterior, inferoseptal and apical segments.  -- no volume overload  -- started on low dose Toprol XL 12.5mg  daily and spiro 12.5mg  daily ( BP and HR prevent higher doses of meds prior to discharge)   Hypothyroidism: continue synthroid  Hypokalemia: K+ 3.2, supplemented prior to discharge   General: Well developed, well nourished, female appearing in no acute distress. Head: Normocephalic, atraumatic.  Neck: Supple without bruits, JVD. Lungs:  Resp regular and unlabored, CTA. Heart: RRR, S1, S2, no S3, S4, or murmur; no rub. Abdomen: Soft, non-tender, non-distended with normoactive bowel sounds. No hepatomegaly. No rebound/guarding. No obvious abdominal masses. Extremities: No clubbing, cyanosis, edema. Distal pedal pulses are 2+ bilaterally. Right radial cath site stable without bruising or hematoma Neuro: Alert and oriented X 3. Moves all extremities spontaneously. Psych: Normal affect.  Patient was seen by Dr. Burt Knack and deemed stable for discharge home. Follow up in the office has been arranged. Medications sent to the [facility] pharmacy. Educated by PharmD prior to discharge.   Did the patient have an acute coronary  syndrome (MI, NSTEMI, STEMI, etc) this admission?:  Yes                               AHA/ACC Clinical Performance & Quality Measures: Aspirin prescribed? - Yes ADP Receptor Inhibitor (Plavix/Clopidogrel, Brilinta/Ticagrelor or Effient/Prasugrel) prescribed (includes medically managed patients)? - Yes Beta Blocker prescribed? - Yes High Intensity Statin (Lipitor 40-80mg  or Crestor 20-40mg ) prescribed? - Yes EF assessed during THIS hospitalization? - Yes For EF <40%, was ACEI/ARB prescribed? - Not Applicable (EF >/= 45%) For EF <40%, Aldosterone Antagonist (Spironolactone or Eplerenone) prescribed? - Not Applicable (EF >/= 62%) Cardiac Rehab Phase II ordered (including medically managed patients)? - Yes      _____________  Discharge Vitals Blood pressure 120/73, pulse 65, temperature (!) 97.3 F (36.3 C), temperature source Oral, resp. rate 18, height 5\' 2"  (1.575 m), weight 69.8 kg, SpO2 97 %.  Filed Weights   08/31/20 2227 09/01/20 0607  Weight: 75 kg 69.8 kg    Labs & Radiologic Studies    CBC Recent Labs    08/31/20 2133 09/01/20 1849 09/02/20 0249  WBC 10.2 14.2* 11.4*  NEUTROABS 7.1  --   --   HGB 13.8 12.9 12.5  HCT 42.3 37.6 37.0  MCV 99.8 97.4 97.1  PLT 280 246 176   Basic Metabolic Panel Recent Labs    08/31/20 2133 09/01/20 1849 09/02/20 0249  NA 136  --  138  K 4.2  --  3.2*  CL 103  --  108  CO2 25  --  23  GLUCOSE 133*  --  89  BUN 19  --  6*  CREATININE 0.62 0.50 0.50  CALCIUM 9.0  --  8.2*   Liver Function Tests Recent Labs    08/31/20 2133  AST 21  ALT 19  ALKPHOS 53  BILITOT 0.6  PROT 6.7  ALBUMIN 4.0   No results for input(s): LIPASE, AMYLASE in the last 72 hours. High Sensitivity Troponin:   Recent Labs  Lab 08/31/20 2133 08/31/20 2333  TROPONINIHS 72* 1,077*    BNP Invalid input(s): POCBNP D-Dimer No results for input(s): DDIMER in the last 72 hours. Hemoglobin A1C Recent Labs    09/01/20 1011  HGBA1C 5.7*   Fasting  Lipid Panel No results for input(s): CHOL, HDL, LDLCALC, TRIG, CHOLHDL, LDLDIRECT in the last 72 hours. Thyroid Function Tests No results for input(s): TSH, T4TOTAL, T3FREE, THYROIDAB in the last 72 hours.  Invalid input(s): FREET3 _____________  DG Chest 2 View  Result Date: 08/31/2020 CLINICAL DATA:  Chest pain EXAM: CHEST - 2 VIEW COMPARISON:  02/17/2018 FINDINGS: Increased markings in the medial right lung base could reflect pneumonia. This is noted posteriorly on the lateral view. Left lung clear. Heart is normal size. No effusions or acute bony abnormality. IMPRESSION: Right medial posterior basilar opacity concerning for pneumonia. Electronically Signed   By: Rolm Baptise M.D.   On: 08/31/2020 22:03   CARDIAC CATHETERIZATION  Result Date: 09/01/2020  Dist LAD lesion is 75% stenosed.   Subtotal occlusion of the mid LAD with distal TIMI grade II flow.  Stenosis is a Medina 010 bifurcation stenosis with the first diagonal.  Successful angioplasty and stenting using a 22 x 2.0 mm Onyx postdilated to 2.25 mm in diameter, avoiding jailing the larger first diagonal.  0% stenosis with TIMI grade III flow was noted.  Tandem 50 and 40% proximal and mid RCA stenoses.  Right coronary is codominant.  Left main is widely patent.   Circumflex is widely patent.  The circumflex and obtuse marginals are tortuous.  Apical severe hypokinesis/akinesis.  LVEDP 6 mmHg. RECOMMENDATIONS:  Aspirin and Brilinta for 12 months.  Aggressive risk factor modification  High intensity statin therapy   DG BONE DENSITY (DXA)  Result Date: 08/11/2020 EXAM: DUAL X-RAY ABSORPTIOMETRY (DXA) FOR BONE MINERAL DENSITY IMPRESSION: Referring Physician:  Virgie Dad Your patient completed a bone mineral density test using GE Lunar iDXA system (analysis version: 16). Technologist: KAT PATIENT: Name: Mikita, Lesmeister Patient ID: [id number] Birth Date: 01-19-1941 Height: 62.5 in. Sex: Female Measured: 08/11/2020 Weight: 155.6  lbs. Indications: Advanced Age, Caucasian, Estrogen Deficient, History of Fracture (Adult) (V15.51), History of Osteoporosis, Hypothyroid, Levothyroxine, Postmenopausal Fractures: Right Elbow Treatments: Calcium (E943.0), Fosamax, Vitamin D (E933.5) ASSESSMENT: The BMD measured at Femur Neck Right is 0.682 g/cm2 with a T-score of -2.6. This patient is considered osteoporotic according to [facility]) criteria. The quality of the exam is good. L -4 was excluded due to degenerative changes. Site Region Measured Date Measured Age [age]A BMD Significant CHANGE T-score AP Spine L1-L3 08/11/2020 79.2 -2.2 0.909 g/cm2 * AP Spine  L1-L3      09/05/2017    76.3         -  2.4    0.877 g/cm2 DualFemur Neck Right 08/11/2020    79.2         -2.6    0.682 g/cm2 DualFemur Neck Right 09/05/2017    76.3         -2.6    0.677 g/cm2 DualFemur Total Mean 08/11/2020    79.2         -1.8    0.778 g/cm2 DualFemur Total Mean 09/05/2017    76.3         -2.0    0.756 g/cm2 World Health Organization [facility]) criteria for post-menopausal, Caucasian Women: Normal       T-score at or above -1 SD Osteopenia   T-score between -1 and -2.5 SD Osteoporosis T-score at or below -2.5 SD RECOMMENDATION: 1. All patients should optimize calcium and vitamin D intake. 2. Consider FDA-approved medical therapies in postmenopausal women and men aged 73 years and older, based on the following: a. A hip or vertebral (clinical or morphometric) fracture. b. T-score = -2.5 at the femoral neck or spine after appropriate evaluation to exclude secondary causes. c. Low bone mass (T-score between -1.0 and -2.5 at the femoral neck or spine) and a 10-year probability of a hip fracture = 3% or a 10-year probability of a major osteoporosis-related fracture = 20% based on the [place]-adapted WHO algorithm. d. Clinician judgment and/or patient preferences may indicate treatment for people with 10-year fracture probabilities above or below these levels. FOLLOW-UP:  Patients with diagnosis of osteoporosis or at high risk for fracture should have regular bone mineral density tests.? Patients eligible for Medicare are allowed routine testing every 2 years.? The testing frequency can be increased to one year for patients who have rapidly progressing disease, are receiving or discontinuing medical therapy to restore bone mass, or have additional risk factors. I have reviewed this study and agree with the findings. Mark A. Thornton Papas, M.D. [facility] Electronically Signed   By: Lavonia Dana M.D.   On: 08/11/2020 16:32   ECHOCARDIOGRAM COMPLETE  Result Date: 09/01/2020    ECHOCARDIOGRAM REPORT   Patient Name:   ZANYIAH POSTEN Date of Exam: 09/01/2020 Medical Rec #:  [id number]         Height:       62.0 in Accession #:    [id number]        Weight:       153.9 lb Date of Birth:  1940/07/25         BSA:          1.710 m Patient Age:    80 years          BP:           132/86 mmHg Patient Gender: F                 HR:           65 bpm. Exam Location:  Inpatient Procedure: 2D Echo, Cardiac Doppler and Color Doppler Indications:    NSTEMI  History:        Patient has no prior history of Echocardiogram examinations.                 Risk Factors:Dyslipidemia.  Sonographer:    Cammy Brochure Referring Phys: [id number] [place]  1. Left ventricular ejection fraction, by estimation, is 40 to 45%. The left ventricle has mildly decreased function. The left ventricle demonstrates regional wall motion abnormalities (see scoring diagram/findings for description). Left ventricular  diastolic parameters are consistent with Grade I diastolic dysfunction (impaired relaxation). There is severe hypokinesis of the left ventricular, apical anteroseptal wall, anterior wall, inferoseptal wall and apical segment.  2. Right ventricular systolic function is normal. The right ventricular size is normal.  3. The mitral valve is normal in structure. Mild mitral valve  regurgitation. No evidence of mitral stenosis.  4. The aortic valve is normal in structure. Aortic valve regurgitation is trivial. Mild aortic valve sclerosis is present, with no evidence of aortic valve stenosis.  5. The inferior vena cava is normal in size with greater than 50% respiratory variability, suggesting right atrial pressure of 3 mmHg. FINDINGS  Left Ventricle: Left ventricular ejection fraction, by estimation, is 40 to 45%. The left ventricle has mildly decreased function. The left ventricle demonstrates regional wall motion abnormalities. Severe hypokinesis of the left ventricular, apical anteroseptal wall, anterior wall, inferoseptal wall and apical segment. The left ventricular internal cavity size was normal in size. There is no left ventricular hypertrophy. Left ventricular diastolic parameters are consistent with Grade I diastolic dysfunction (impaired relaxation). Normal left ventricular filling pressure.  LV Wall Scoring: The mid and distal anterior septum, apical anterior segment, apical inferior segment, and apex are hypokinetic. Right Ventricle: The right ventricular size is normal. No increase in right ventricular wall thickness. Right ventricular systolic function is normal. Left Atrium: Left atrial size was normal in size. Right Atrium: Right atrial size was normal in size. Pericardium: There is no evidence of pericardial effusion. Mitral Valve: The mitral valve is normal in structure. Mild mitral valve regurgitation. No evidence of mitral valve stenosis. Tricuspid Valve: The tricuspid valve is normal in structure. Tricuspid valve regurgitation is mild . No evidence of tricuspid stenosis. Aortic Valve: The aortic valve is normal in structure. Aortic valve regurgitation is trivial. Mild aortic valve sclerosis is present, with no evidence of aortic valve stenosis. Aortic valve mean gradient measures 4.0 mmHg. Aortic valve peak gradient measures 7.1 mmHg. Aortic valve area, by VTI measures  1.79 cm. Pulmonic Valve: The pulmonic valve was normal in structure. Pulmonic valve regurgitation is not visualized. No evidence of pulmonic stenosis. Aorta: The aortic root is normal in size and structure. Venous: The inferior vena cava is normal in size with greater than 50% respiratory variability, suggesting right atrial pressure of 3 mmHg. IAS/Shunts: No atrial level shunt detected by color flow Doppler.  LEFT VENTRICLE PLAX 2D LVIDd:         3.80 cm  Diastology LVIDs:         2.60 cm  LV e' medial:    7.94 cm/s LV PW:         1.00 cm  LV E/e' medial:  9.7 LV IVS:        0.90 cm  LV e' lateral:   9.46 cm/s LVOT diam:     1.70 cm  LV E/e' lateral: 8.2 LV SV:         63 LV SV Index:   37 LVOT Area:     2.27 cm  RIGHT VENTRICLE            IVC RV Basal diam:  3.00 cm    IVC diam: 1.70 cm RV S prime:     8.16 cm/s TAPSE (M-mode): 2.4 cm LEFT ATRIUM             Index       RIGHT ATRIUM           Index LA diam:  2.80 cm 1.64 cm/m  RA Area:     12.80 cm LA Vol (A2C):   41.4 ml 24.21 ml/m RA Volume:   29.20 ml  17.07 ml/m LA Vol (A4C):   54.8 ml 32.04 ml/m LA Biplane Vol: 49.9 ml 29.18 ml/m  AORTIC VALVE AV Area (Vmax):    1.88 cm AV Area (Vmean):   1.82 cm AV Area (VTI):     1.79 cm AV Vmax:           133.00 cm/s AV Vmean:          92.100 cm/s AV VTI:            0.352 m AV Peak Grad:      7.1 mmHg AV Mean Grad:      4.0 mmHg LVOT Vmax:         110.00 cm/s LVOT Vmean:        73.700 cm/s LVOT VTI:          0.277 m LVOT/AV VTI ratio: 0.79  AORTA Ao Root diam: 2.80 cm Ao Asc diam:  2.80 cm MITRAL VALVE                TRICUSPID VALVE MV Area (PHT): 3.37 cm     TR Peak grad:   23.0 mmHg MV Decel Time: 225 msec     TR Vmax:        240.00 cm/s MV E velocity: 77.10 cm/s MV A velocity: 102.00 cm/s  SHUNTS MV E/A ratio:  0.76         Systemic VTI:  0.28 m                             Systemic Diam: 1.70 cm Dani Gobble Croitoru MD Electronically signed by Sanda Klein MD Signature Date/Time: 09/01/2020/4:20:21 PM     Final    Disposition   Pt is being discharged home today in good condition.  Follow-up Plans & Appointments     Follow-up Information     Imogene Burn, PA-C Follow up on 09/15/2020.   Specialty: Cardiology Why: at 11:45am for your follow up appt Contact information: [address] [place] [address] [phone]         [facility] Follow up on 09/08/2020.   Specialty: Cardiology Why: please come in for follow up labs Contact information: [address], [place]               Discharge Instructions     Amb Referral to Cardiac Rehabilitation   Complete by: As directed    Diagnosis:  Coronary Stents NSTEMI     After initial evaluation and assessments completed: Virtual Based Care may be provided alone or in conjunction with Phase 2 Cardiac Rehab based on patient barriers.: Yes   Call MD for:  difficulty breathing, headache or visual disturbances   Complete by: As directed    Call MD for:  redness, tenderness, or signs of infection (pain, swelling, redness, odor or green/yellow discharge around incision site)   Complete by: As directed    Diet - low sodium heart healthy   Complete by: As directed    Discharge instructions   Complete by: As directed    Radial Site Care Refer to this sheet in the next few weeks. These instructions provide you with information on caring for yourself after your procedure. Your caregiver  may also give you more specific instructions. Your treatment has been planned according to current medical practices, but problems sometimes occur. Call your caregiver if you have any problems or questions after your procedure. HOME CARE INSTRUCTIONS You may shower the day after the procedure. Remove the bandage (dressing) and gently wash the site with plain soap and water. Gently pat the site dry.  Do not apply powder or lotion to the site.  Do not submerge  the affected site in water for 3 to 5 days.  Inspect the site at least twice daily.  Do not flex or bend the affected arm for 24 hours.  No lifting over 5 pounds (2.3 kg) for 5 days after your procedure.  Do not drive home if you are discharged the same day of the procedure. Have someone else drive you.  You may drive 24 hours after the procedure unless otherwise instructed by your caregiver.  What to expect: Any bruising will usually fade within 1 to 2 weeks.  Blood that collects in the tissue (hematoma) may be painful to the touch. It should usually decrease in size and tenderness within 1 to 2 weeks.  SEEK IMMEDIATE MEDICAL CARE IF: You have unusual pain at the radial site.  You have redness, warmth, swelling, or pain at the radial site.  You have drainage (other than a small amount of blood on the dressing).  You have chills.  You have a fever or persistent symptoms for more than 72 hours.  You have a fever and your symptoms suddenly get worse.  Your arm becomes pale, cool, tingly, or numb.  You have heavy bleeding from the site. Hold pressure on the site.   PLEASE DO NOT MISS ANY DOSES OF YOUR BRILINTA!!!!! Also keep a log of you blood pressures and bring back to your follow up appt. Please call the office with any questions.   Patients taking blood thinners should generally stay away from medicines like ibuprofen, Advil, Motrin, naproxen, and Aleve due to risk of stomach bleeding. You may take Tylenol as directed or talk to your primary doctor about alternatives.  PLEASE ENSURE THAT YOU DO NOT RUN OUT OF YOUR BRILINTA. This medication is very important to remain on for at least one year. IF you have issues obtaining this medication due to cost please CALL the office 3-5 business days prior to running out in order to prevent missing doses of this medication.   Increase activity slowly   Complete by: As directed        Discharge Medications   Allergies as of 09/02/2020        Reactions   Atorvastatin    Muscle weakness   Rosuvastatin    Muscle weaknes        Medication List     STOP taking these medications    Red Yeast Rice 600 MG Tabs       TAKE these medications    acetaminophen 650 MG CR tablet Commonly known as: TYLENOL Take 1,300 mg by mouth every 8 (eight) hours as needed for pain.   alendronate 70 MG tablet Commonly known as: FOSAMAX TAKE 1 TABLET BY MOUTH EVERY 7 DAYS WITH A FULL GLASS OF WATER AND ON AN EMPTY STOMACH What changed:  how much to take how to take this when to take this additional instructions   aspirin 81 MG chewable tablet Chew 1 tablet (81 mg total) by mouth daily. Start taking on: September 03, 2020   Calcium 600+D 600-800  MG-UNIT Tabs Generic drug: Calcium Carb-Cholecalciferol Take 2 tablets by mouth daily.   cholecalciferol 1000 units tablet Commonly known as: VITAMIN D Take 2,000 Units by mouth daily.   FERROUS SULFATE PO Take 1 tablet by mouth daily.   l-methylfolate-B6-B12 3-35-2 MG Tabs tablet Commonly known as: METANX Take 1 tablet by mouth daily.   levothyroxine 125 MCG tablet Commonly known as: SYNTHROID TAKE 1 TABLET BY MOUTH  DAILY What changed: when to take this   metoprolol succinate 25 MG 24 hr tablet Commonly known as: TOPROL-XL Take 0.5 tablets (12.5 mg total) by mouth daily. Start taking on: September 03, 2020   nitroGLYCERIN 0.4 MG SL tablet Commonly known as: NITROSTAT Place 1 tablet (0.4 mg total) under the tongue every 5 (five) minutes as needed for chest pain.   rosuvastatin 40 MG tablet Commonly known as: CRESTOR Take 1 tablet (40 mg total) by mouth daily. Start taking on: September 03, 2020   simethicone 80 MG chewable tablet Commonly known as: Gas-X Chew 1 tablet (80 mg total) by mouth every 6 (six) hours as needed for flatulence.   spironolactone 25 MG tablet Commonly known as: ALDACTONE Take 0.5 tablets (12.5 mg total) by mouth daily. Start taking on: September 03, 2020    ticagrelor 90 MG Tabs tablet Commonly known as: BRILINTA Take 1 tablet (90 mg total) by mouth 2 (two) times daily.   vitamin C 500 MG tablet Commonly known as: ASCORBIC ACID Take 500 mg by mouth daily.          Outstanding Labs/Studies   BMET in one week  Duration of Discharge Encounter   Greater than 30 minutes including physician time.  Signed, Reino Bellis, NP 09/02/2020, 12:56 PM   Patient seen, examined. Available data reviewed. Agree with findings, assessment, and plan as outlined by Reino Bellis, NP.  The patient is independently interviewed and examined.  She is alert and oriented, in no distress.  The patient is ambulating in the room without problems or symptoms.  HEENT is normal, lungs are clear, heart is regular rate and rhythm with no murmur gallop, abdomen is soft and nontender, extremities have no edema, the right radial's cath site is clear.  Cardiac catheterization data reviewed and the patient underwent uncomplicated PCI of the LAD yesterday.  She is feeling well today and tolerating current medical therapy.  No changes are recommended except as outlined above.  I explained the rationale for dual antiplatelet therapy, high intensity statin drug, and a low-dose beta-blocker as tolerated.  Follow-up is arranged as above.  The patient is medically stable for hospital discharge today.  Sherren Mocha, M.D. 09/02/2020 12:56 PM

## 2020-09-02 NOTE — TOC Benefit Eligibility Note (Signed)
Patient Teacher, English as a foreign language completed.    The patient is currently admitted and upon discharge could be taking Brilinta 90 mg.  The current 30 day co-pay is, $318.86 due to a $273.86 remaining deductible, should be $45.00 a month after this.   The patient is insured through [place], [place] Patient Advocate Specialist [place] Team Direct Number: [phone]  Fax: [phone]

## 2020-09-02 NOTE — Telephone Encounter (Signed)
Patient got orders to have labs done at our office. The patient can have labs done at [facility] where she lives and just wanted to know if we could fax lab orders to the facility. Please assist

## 2020-09-02 NOTE — Progress Notes (Signed)
Pt is instructed to get the medications from [place] going to exit.

## 2020-09-02 NOTE — Progress Notes (Signed)
CARDIAC REHAB PHASE I   PRE:  Rate/Rhythm: 73 SR  BP:  Sitting: 102/63      SaO2: 94 RA  MODE:  Ambulation: 300 ft   POST:  Rate/Rhythm: 74 SR  BP:  Sitting: 108/67      SaO2: 93 RA Pt ambulated 300 ft independently. Pt tolerated exercise well. Pt C/O of  slight SOB after walk. Stable vital signs after walk. Discussed risk factors, exercise guidelines, NTG use, stent, MI, restrictions, and nutrition. Educated pt on ASA and Brilinta.Gave pt MI booklet. Pt verbalized understanding of education. Pt referred to [place]. [phone]  Sheppard Plumber, [place] 09/02/2020 8:47 AM

## 2020-09-02 NOTE — Progress Notes (Signed)
Pt is alert and oriented. Discharge instructions/ AVS given to pt. 

## 2020-09-04 ENCOUNTER — Telehealth: Payer: Self-pay

## 2020-09-04 NOTE — Progress Notes (Signed)
Cardiology Office Note    Date:  09/15/2020   ID:  Jessica [name], DOB 05-10-1940, MRN [id number]   PCP:  Virgie Dad, MD   [facility]  Cardiologist:  Sherren Mocha, MD  Advanced Practice Provider:  No care team member to display Electrophysiologist:  None   [id number]   Chief Complaint  Patient presents with   Hospitalization Follow-up     History of Present Illness:  Jessica [name] is a 80 y.o. female  with hypothyroidism, HLD, and prior pericarditis 2000.   She was admitted with NSTEMI 09/01/20 treated DES mLAD residual 50% and 40% p/mRCA. DAPT for one year.ICM EF 40-45% G1DD, HLD LDL 185.  Patient comes in for f/u. Says she's tired all the time, no stamina. Walks 20-30 min slowly daily. Denies chest pain, shortness of breath, dizziness or presyncope. Answered many questions. Asking if she can do strength training. Disappointed she can't get into cardiac rehab.    Past Medical History:  Diagnosis Date   Anemia 12/2015   Bronchitis, chronic (HCC)    in her 35s   Cataract    Chronic kidney disease    in 20s had nephritis   Closed fracture of right distal humerus    right   Hearing loss 05/03/2016   High cholesterol    Hypothyroidism    Osteopenia    Pericarditis 01/1999   Pneumothorax 01/1999   Thyroid disorder    Vitamin B 12 deficiency     Past Surgical History:  Procedure Laterality Date   birthmark      removed,left leg   CATARACT EXTRACTION  2017   CORONARY STENT INTERVENTION N/A 09/01/2020   Procedure: CORONARY STENT INTERVENTION;  Surgeon: Belva Crome, MD;  Location: [place] [facility];  Service: Cardiovascular;  Laterality: N/A;   LEFT HEART CATH AND CORONARY ANGIOGRAPHY N/A 09/01/2020   Procedure: LEFT HEART CATH AND CORONARY ANGIOGRAPHY;  Surgeon: Belva Crome, MD;  Location: [place] [facility];  Service: Cardiovascular;  Laterality: N/A;   ORIF HUMERUS FRACTURE Right 04/10/2019   Procedure: RIGHT OPEN  REDUCTION INTERNAL FIXATION (ORIF) DISTAL HUMERUS FRACTURE WITH EXTENSION;  Surgeon: Hiram Gash, MD;  Location: [place];  Service: Orthopedics;  Laterality: Right;   [place]?    Current Medications: Current Meds  Medication Sig   acetaminophen (TYLENOL) 650 MG CR tablet Take 1,300 mg by mouth every 8 (eight) hours as needed for pain.   alendronate (FOSAMAX) 70 MG tablet TAKE 1 TABLET BY MOUTH EVERY 7 DAYS WITH A FULL GLASS OF WATER AND ON AN EMPTY STOMACH   aspirin 81 MG chewable tablet Chew 1 tablet (81 mg total) by mouth daily.   Calcium Carb-Cholecalciferol (CALCIUM 600+D) 600-800 MG-UNIT TABS Take 2 tablets by mouth daily.   cholecalciferol (VITAMIN D) 1000 UNITS tablet Take 2,000 Units by mouth daily.    FERROUS SULFATE PO Take 1 tablet by mouth daily.   l-methylfolate-B6-B12 (METANX) 3-35-2 MG TABS tablet Take 1 tablet by mouth daily.   levothyroxine (SYNTHROID) 125 MCG tablet TAKE 1 TABLET BY MOUTH  DAILY   metoprolol succinate (TOPROL-XL) 25 MG 24 hr tablet Take 0.5 tablets (12.5 mg total) by mouth daily.   nitroGLYCERIN (NITROSTAT) 0.4 MG SL tablet Place 1 tablet (0.4 mg total) under the tongue every 5 (five) minutes as needed for chest pain.   rosuvastatin (CRESTOR) 40 MG tablet Take 1 tablet (40 mg total) by mouth  daily.   simethicone (GAS-X) 80 MG chewable tablet Chew 1 tablet (80 mg total) by mouth every 6 (six) hours as needed for flatulence.   ticagrelor (BRILINTA) 90 MG TABS tablet Take 1 tablet (90 mg total) by mouth 2 (two) times daily.   vitamin C (ASCORBIC ACID) 500 MG tablet Take 500 mg by mouth daily.   [DISCONTINUED] spironolactone (ALDACTONE) 25 MG tablet Take 0.5 tablets (12.5 mg total) by mouth daily.     Allergies:   Atorvastatin and Rosuvastatin   Social History   Socioeconomic History   Marital status: Divorced    Spouse name: Not on file   Number of children: 1   Years of education: college   Highest  education level: Not on file  Occupational History   Occupation: retired  Tobacco Use   Smoking status: Never   Smokeless tobacco: Never  Vaping Use   Vaping Use: Never used  Substance and Sexual Activity   Alcohol use: Yes    Alcohol/week: 3.0 standard drinks    Types: 3 Glasses of wine per week    Comment: social   Drug use: No   Sexual activity: Not on file  Other Topics Concern   Not on file  Social History Narrative   Moved to [facility] 01/14/2015   Do you drink/eat things with caffeine?  yes   Marital status?      divorced                               Do you live in a house, apartment, assisted living, condo, trailer, etc.?  Retirement community   Is it one or more stories? Yes, 3   How many persons live in your home?  1   Do you have any pets in your home? (please list) 0   Current or past profession:  Librarian   Do you exercise?     yes                                 Type & how often? Walk-varies   Do you have a living will? yes   Do you have a DNR form?         no                         If not, do you want to discuss one? yes   Do you have signed POA/HPOA for forms? yes   Social Determinants of Health   Financial Resource Strain: Not on file  Food Insecurity: Not on file  Transportation Needs: Not on file  Physical Activity: Not on file  Stress: Not on file  Social Connections: Not on file     Family History:  The patient's  family history includes Arthritis in her paternal grandmother; Asthma in her mother; Ataxia in her father; Cancer - Cervical in her sister; Diabetes in her maternal grandmother, mother, and another family member; Stroke in her father; Thyroid disease in her sister.   ROS:   Please see the history of present illness.    ROS All other systems reviewed and are negative.   PHYSICAL EXAM:   VS:  BP (!) 100/56   Pulse 71   Ht 5\' 2"  (1.575 m)   Wt 150 lb 3.2 oz (68.1 kg)   SpO2 97%  BMI 27.47 kg/m   Physical Exam  GEN: Well  nourished, well developed, in no acute distress  Neck: no JVD, carotid bruits, or masses Cardiac:RRR; no murmurs, rubs, or gallops  Respiratory:  clear to auscultation bilaterally, normal work of breathing GI: soft, nontender, nondistended, + BS Ext: right arm at cath sight without hematoma or hemorrhage, otherwise lower ext without cyanosis, clubbing, or edema, Good distal pulses bilaterally Neuro:  Alert and Oriented x 3, Psych: euthymic mood, full affect  Wt Readings from Last 3 Encounters:  09/15/20 150 lb 3.2 oz (68.1 kg)  09/01/20 153 lb 14.1 oz (69.8 kg)  07/29/20 155 lb 9.6 oz (70.6 kg)      Studies/Labs Reviewed:   EKG:  EKG is not ordered today.    Recent Labs: 07/23/2020: TSH 1.08 08/31/2020: ALT 19 09/02/2020: Hemoglobin 12.5; Platelets 217 09/08/2020: BUN 18; Creatinine, Ser 0.69; Potassium 4.5; Sodium 137   Lipid Panel    Component Value Date/Time   CHOL 267 (H) 07/23/2020 0815   TRIG 126 07/23/2020 0815   HDL 56 07/23/2020 0815   CHOLHDL 4.8 07/23/2020 0815   VLDL 18 02/24/2014 0926   LDLCALC 185 (H) 07/23/2020 0815   LDLDIRECT 135.4 12/26/2006 0847    Additional studies/ records that were reviewed today include:  Echo: 09/01/20   IMPRESSIONS     1. Left ventricular ejection fraction, by estimation, is 40 to 45%. The  left ventricle has mildly decreased function. The left ventricle  demonstrates regional wall motion abnormalities (see scoring  diagram/findings for description). Left ventricular  diastolic parameters are consistent with Grade I diastolic dysfunction  (impaired relaxation). There is severe hypokinesis of the left  ventricular, apical anteroseptal wall, anterior wall, inferoseptal wall  and apical segment.   2. Right ventricular systolic function is normal. The right ventricular  size is normal.   3. The mitral valve is normal in structure. Mild mitral valve  regurgitation. No evidence of mitral stenosis.   4. The aortic valve is normal  in structure. Aortic valve regurgitation is  trivial. Mild aortic valve sclerosis is present, with no evidence of  aortic valve stenosis.   5. The inferior vena cava is normal in size with greater than 50%  respiratory variability, suggesting right atrial pressure of 3 mmHg. _____________  Cath: 09/01/20     Subtotal occlusion of the mid LAD with distal TIMI grade II flow.  Stenosis is a [place] 010 bifurcation stenosis with the first diagonal. Successful angioplasty and stenting using a 22 x 2.0 mm Onyx postdilated to 2.25 mm in diameter, avoiding jailing the larger first diagonal.  0% stenosis with TIMI grade III flow was noted. Tandem 50 and 40% proximal and mid RCA stenoses.  Right coronary is codominant. Left main is widely patent.   Circumflex is widely patent.  The circumflex and obtuse marginals are tortuous. Apical severe hypokinesis/akinesis.  LVEDP 6 mmHg.     RECOMMENDATIONS:   Aspirin and Brilinta for 12 months. Aggressive risk factor modification High intensity statin therapy   Risk Assessment/Calculations:         ASSESSMENT:    1. Coronary artery disease involving native coronary artery of native heart without angina pectoris   2. Ischemic cardiomyopathy   3. HYPERLIPIDEMIA      PLAN:  In order of problems listed above:  CAD S/P NSTEMI treated DES mLAD with residual 50% & 40% RCA on Brilinta and ASA for 1 yr. Cardiac rehab  ICM LVEF 40-45% G1DD on toprol and  spiro-BP running low and very fatigued. Will stop spiro and check echo in 3 months.   HLD LDL 185 on crestor 40 mg(muscle aches on statins in past)-she is being seen today for possible enrollment in lipid trial.  Shared Decision Making/Informed Consent        Medication Adjustments/Labs and Tests Ordered: Current medicines are reviewed at length with the patient today.  Concerns regarding medicines are outlined above.  Medication changes, Labs and Tests ordered today are listed in the Patient  Instructions below. Patient Instructions  Medication Instructions:  Your physician has recommended you make the following change in your medication:   STOP: Spironolactone  *If you need a refill on your cardiac medications before your next appointment, please call your pharmacy*   Lab Work: None  If you have labs (blood work) drawn today and your tests are completely normal, you will receive your results only by: [place] (if you have MyChart) OR A paper copy in the mail If you have any lab test that is abnormal or we need to change your treatment, we will call you to review the results.   Testing/Procedures: Your physician has requested that you have an echocardiogram. Echocardiography is a painless test that uses sound waves to create images of your heart. It provides your doctor with information about the size and shape of your heart and how well your heart's chambers and valves are working. This procedure takes approximately one hour. There are no restrictions for this procedure.   Follow-Up: At [facility], you and your health needs are our priority.  As part of our continuing mission to provide you with exceptional heart care, we have created designated Provider Care Teams.  These Care Teams include your primary Cardiologist (physician) and Advanced Practice Providers (APPs -  Physician Assistants and Nurse Practitioners) who all work together to provide you with the care you need, when you need it.   Your next appointment:   As scheduled   Signed, Ermalinda Barrios, PA-C  09/15/2020 1:01 PM    [facility] [address] [place], [place], [place]  [address] Phone: [phone]; Fax: [phone]

## 2020-09-04 NOTE — Telephone Encounter (Signed)
Transition Care Management Follow-Up Telephone Call   Date discharged and where:  How have you been since you were released from the hospital? Just tired, no energy  Any patient concerns? No  Items Reviewed:   Meds: Yes  Allergies:Yes  Dietary Changes Reviewed:  Functional Questionnaire:  Independent-I Dependent-D  ADLs:   Dressing- No problems    Eating-No problems   Maintaining continence-The usual   Transferring-   Transportation-no problems   Meal Prep-No problems   Managing Meds- No problem  Confirmed importance and Date/Time of follow-up visits scheduled:   Confirmed with patient if condition worsens to call PCP or go to the Emergency Dept. Patient was given office number and encouraged to call back with questions or concerns:

## 2020-09-04 NOTE — Telephone Encounter (Signed)
I called [facility] to follow up on needed BMP ordered by Reino Bellis, NP. I spoke with Osborne Oman the independent living nurse regarding labs.  She informed me that phlebotomy only comes to the facility on Monday and Thursday.  Since Monday is a holiday labs can't be drawn until 09/10/20. Called pt to inform her of this.  She expresses that she is going to come to lab appointment scheduled for 09/08/20.  She thanked me for the call.

## 2020-09-06 ENCOUNTER — Encounter: Payer: Self-pay | Admitting: Internal Medicine

## 2020-09-08 ENCOUNTER — Other Ambulatory Visit: Payer: Self-pay

## 2020-09-08 ENCOUNTER — Other Ambulatory Visit: Payer: Medicare Other

## 2020-09-08 DIAGNOSIS — Z79899 Other long term (current) drug therapy: Secondary | ICD-10-CM

## 2020-09-08 LAB — BASIC METABOLIC PANEL
BUN/Creatinine Ratio: 26 (ref 12–28)
BUN: 18 mg/dL (ref 8–27)
CO2: 23 mmol/L (ref 20–29)
Calcium: 9.3 mg/dL (ref 8.7–10.3)
Chloride: 102 mmol/L (ref 96–106)
Creatinine, Ser: 0.69 mg/dL (ref 0.57–1.00)
Glucose: 81 mg/dL (ref 65–99)
Potassium: 4.5 mmol/L (ref 3.5–5.2)
Sodium: 137 mmol/L (ref 134–144)
eGFR: 88 mL/min/{1.73_m2} (ref >59)

## 2020-09-08 NOTE — Telephone Encounter (Signed)
Message forwarded to provider.

## 2020-09-09 ENCOUNTER — Telehealth (HOSPITAL_COMMUNITY): Payer: Self-pay | Admitting: *Deleted

## 2020-09-09 NOTE — Telephone Encounter (Signed)
Received telephone call from pt regarding scheduling for cardiac rehab. Pt discharged from hospital on 6/29.  Pt has follow up scheduled for 7/12.  Advised pt of the referral process.  Pt will be receiving call to review insurance benefits for cardiac rehab.  Pt will need to complete her follow up apt on 7/12 and medically stable to participate in group exercise.  Pt will then be contacted for scheduling CR.  Pt further advised that we do have a tremendous wait list and it may be 2-3 months before she will be able to schedule.  Pt encouraged to continue her exercise as instructed by cardiac rehab phase I inpatient staff for progressive ambulation with goal of 2 30 minutes walking sessions which can be divided into mini walks with a total of 45 minutes to 1 hour.  Pt verbalized understanding. Cherre Huger, BSN Cardiac and Training and development officer

## 2020-09-10 ENCOUNTER — Telehealth (HOSPITAL_COMMUNITY): Payer: Self-pay

## 2020-09-10 NOTE — Telephone Encounter (Signed)
Pt insurance is active and benefits verified through Medicare a/b Co-pay 0, DED $233/$233 met, out of pocket 0/0 met, co-insurance 20%. no pre-authorization required. Passport, 09/10/2020@8 :58am, REF# [id number]   2ndary insurance is active and benefits verified through [place]. Co-pay 0, DED 0/0 met, out of pocket 0/0 met, co-insurance 0. No pre-authorization required. Passport, 09/10/2020@9 :03am, REF# [id number]   Will contact patient to see if she is interested in the Cardiac Rehab Program. If interested, patient will need to complete follow up appt. Once completed, patient will be contacted for scheduling upon review by the RN Navigator.

## 2020-09-10 NOTE — Telephone Encounter (Signed)
Attempted to call patient in regards to Cardiac Rehab - LM on VM 

## 2020-09-11 ENCOUNTER — Other Ambulatory Visit (HOSPITAL_COMMUNITY): Payer: Self-pay

## 2020-09-11 ENCOUNTER — Telehealth (HOSPITAL_COMMUNITY): Payer: Self-pay | Admitting: Pharmacist

## 2020-09-11 NOTE — Telephone Encounter (Signed)
Pharmacy Transitions of Care Follow-up Telephone Call   Date of discharge: 09/02/20 Discharge Diagnosis: NSTEMI- s/p stent placement   How have you been since you were released from the hospital? Patient state she is fine but tired.   Medication changes made at discharge:  - START: Brilinta 90 mg BID; Rosuvastatin 40 mg daily; ASA 81 mg daily; metoprolol succinate 12.5 mg daily, spironolactone 12.5 mg daily    - STOPPED: Stopped Red Yeast Rice     Medication changes verified by the patient? Yes     Medication Accessibility:   Home Pharmacy: [facility]   Was the patient provided with refills on discharged medications? Yes   Have all prescriptions been transferred from [facility] to home pharmacy? Transferred today- spoke with Naaman Plummer   Is the patient able to afford medications? Notable copays: Kary Kos is a $289 copay for patient Eligible patient assistance: [place] program Patient states she contacted insurance company and the first copay will be $289, but after that it will be $40 a month which she can afford.      Medication Review:   TICAGRELOR (BRILINTA) Ticagrelor 90 mg BID initiated on 09/02/20. - Educated patient on expected duration of therapy of aspirin with ticagrelor for 12 months. - Aspirin will be continued for 1 year - Discussed importance of taking medication around the same time every day, - Reviewed potential DDIs with patient - Advised patient of medications to avoid (NSAIDs, aspirin maintenance doses>100 mg daily) - Educated that Tylenol (acetaminophen) will be the preferred analgesic to prevent risk of bleeding - Emphasized importance of monitoring for signs and symptoms of bleeding (abnormal bruising, prolonged bleeding, nose bleeds, bleeding from gums, discolored urine, black tarry stools) - Educated patient to notify doctor if shortness of breath or abnormal heartbeat occur - Advised patient to alert all providers of antiplatelet therapy prior to  starting a new medication or having a procedure     Follow-up Appointments:   PCP Hospital f/u appt confirmed? Yes  Scheduled to see Dr. Lyndel Safe 7/27 at assisted living center.   [facility] f/u appt confirmed?  Scheduled to see Cardiology 09/15/20 11:45 AM   If their condition worsens, is the pt aware to call PCP or go to the Emergency Dept.? Yes   Final Patient Assessment: Patient voiced understanding of the reasons for taking Brilinta, how to take it and monitoring for signs and symptoms of bleeding and she states she will follow up with her cardiologist next week at her scheduled appointment.   Constantin Hillery D. Donneta Romberg, PharmD, BCPS, [place]  [phone]

## 2020-09-15 ENCOUNTER — Ambulatory Visit (INDEPENDENT_AMBULATORY_CARE_PROVIDER_SITE_OTHER): Payer: Medicare Other | Admitting: Physician Assistant

## 2020-09-15 ENCOUNTER — Other Ambulatory Visit: Payer: Self-pay

## 2020-09-15 ENCOUNTER — Encounter: Payer: Self-pay | Admitting: Physician Assistant

## 2020-09-15 VITALS — BP 100/56 | HR 71 | Ht 62.0 in | Wt 150.2 lb

## 2020-09-15 DIAGNOSIS — E782 Mixed hyperlipidemia: Secondary | ICD-10-CM | POA: Diagnosis not present

## 2020-09-15 DIAGNOSIS — I2510 Atherosclerotic heart disease of native coronary artery without angina pectoris: Secondary | ICD-10-CM | POA: Diagnosis not present

## 2020-09-15 DIAGNOSIS — I255 Ischemic cardiomyopathy: Secondary | ICD-10-CM | POA: Diagnosis not present

## 2020-09-15 DIAGNOSIS — Z006 Encounter for examination for normal comparison and control in clinical research program: Secondary | ICD-10-CM

## 2020-09-15 NOTE — Patient Instructions (Signed)
Medication Instructions:  Your physician has recommended you make the following change in your medication:   STOP: Spironolactone  *If you need a refill on your cardiac medications before your next appointment, please call your pharmacy*   Lab Work: None  If you have labs (blood work) drawn today and your tests are completely normal, you will receive your results only by: [place] (if you have MyChart) OR A paper copy in the mail If you have any lab test that is abnormal or we need to change your treatment, we will call you to review the results.   Testing/Procedures: Your physician has requested that you have an echocardiogram. Echocardiography is a painless test that uses sound waves to create images of your heart. It provides your doctor with information about the size and shape of your heart and how well your heart's chambers and valves are working. This procedure takes approximately one hour. There are no restrictions for this procedure.   Follow-Up: At [facility], you and your health needs are our priority.  As part of our continuing mission to provide you with exceptional heart care, we have created designated Provider Care Teams.  These Care Teams include your primary Cardiologist (physician) and Advanced Practice Providers (APPs -  Physician Assistants and Nurse Practitioners) who all work together to provide you with the care you need, when you need it.   Your next appointment:   As scheduled

## 2020-09-15 NOTE — Research (Deleted)
AMGEN Lp(a)  Informed Consent   Subject Name: Jessica [name]  Subject met inclusion and exclusion criteria.  The informed consent form, study requirements and expectations were reviewed with the subject and questions and concerns were addressed prior to the signing of the consent form.  The subject verbalized understanding of the trial requirements.  The subject agreed to participate in the [facility] Lp(a) trial and signed the informed consent at 1305 on 09/15/2020.  The informed consent was obtained prior to performance of any protocol-specific procedures for the subject.  A copy of the signed informed consent was given to the subject and a copy was placed in the subject's medical record.   [name]  Amg[employer] Consent Version 2 Protocol Version 2     Subject # [id number] [employer] Lp(a) [id number] Site # [id number]  SEX       _0    Female                                               _1    Female  Ethnicity       _2   Hispanic or Latino               _3   Not Hispanic or Latino  Race       _4   White                    _5   Black or African American         _6   Asian       _7   American [place] or [place] Native                  _8   Native Hawaiian or Other Pacific Islander                      _9   Other  Other   Age 80  Subject Group       _10    Local Lab                          _11      Historical Lp(a)  value  Future research       _12    Yes                                                  _13     No    Amgen [id number] Site # [id number] Subject # [id number]  ELIGIBILITY CRITERIA WORKSHEET Code INCLUSION CRITERIA   101 Subject has provided informed consent prior to the initiation of any study specific activities/procedures _14   57 Age 37 to 71 years _15   13 MI (presumed type 1) _16   103 PCI (with high-risk features) with at least 1 of the following _17    Age >65 _18    Diabetes mellitus _19    History of ischemic stroke _20    History of peripheral arterial disease _21    Residual stenosis ? 50% _22     Multivessel PCI (ie, ? 2 vessels, including branch arteries _23   CODE EXCLUSION CRITERIA   201 Subjects known to be currently receiving investigational drug in a clinical study that is anticipated to last >  1 year _0   202 Known Lp(a) value <9m/dL or < 200nmol/L _1   203 Subject has a diagnosis of end-stage renal disease or requires dialysis. _2   204 Poorly controlled (glycated hemoglobin [HbA1c] > 10%) diabetes mellitus (type 1 or type 2) _3   205 Subject is receiving or has received lipoprotein apheresis to reduce Lp(a) within 3 months prior to enrollment. _4   206 Known uncontrolled or recurrent ventricular tachycardia in the past 3 months prior to enrollment. _5   207 Known malignancy (except non-melanoma skin cancers, cervical in situ carcinoma, breast ductal carcinoma in situ, or stage 1 prostate carcinoma) within the last 5 years prior to enrollment. _6   208 Known history or evidence of clinically significant disease (eg, respiratory, gastrointestinal, or psychiatric disease) or unstable disorder or biomarker that, in the opinion of the investigator(s), would result in life expectancy < 5 years. _7   209  Known hemorrhagic stroke. _8    2

## 2020-09-16 ENCOUNTER — Other Ambulatory Visit: Payer: Self-pay

## 2020-09-16 ENCOUNTER — Telehealth: Payer: Self-pay | Admitting: *Deleted

## 2020-09-16 ENCOUNTER — Telehealth: Payer: Self-pay

## 2020-09-16 DIAGNOSIS — I214 Non-ST elevation (NSTEMI) myocardial infarction: Secondary | ICD-10-CM

## 2020-09-16 LAB — LIPOPROTEIN A (LPA): Lipoprotein (a): 381.6 nmol/L — ABNORMAL HIGH (ref <75.0)

## 2020-09-16 NOTE — Telephone Encounter (Signed)
   [facility] Pre-operative Risk Assessment    Patient Name: Jessica [name]  DOB: 02/24/1941 MRN: [id number]  HEARTCARE STAFF:  - IMPORTANT!!!!!! Under Visit Info/Reason for Call, type in Other and utilize the format Clearance MM/DD/YY or Clearance TBD. Do not use dashes or single digits. - Please review there is not already an duplicate clearance open for this procedure. - If request is for dental extraction, please clarify the # of teeth to be extracted. - If the patient is currently at the dentist's office, call Pre-Op Callback Staff (MA/nurse) to input urgent request.  - If the patient is not currently in the dentist office, please route to the Pre-Op pool.  Request for surgical clearance:  What type of surgery is being performed?  DENTAL CLEANING  When is this surgery scheduled?  TBD  What type of clearance is required (medical clearance vs. Pharmacy clearance to hold med vs. Both)?  MEDICAL   Are there any medications that need to be held prior to surgery and how long?  N/A  Practice name and name of physician performing surgery?  Sheran Luz, DDS  What is the office phone number?  [phone]   7.   What is the office fax number?  [phone]  8.   Anesthesia type (None, local, MAC, general) ?    Jeanann Lewandowsky 09/16/2020, 11:00 AM  _________________________________________________________________   (provider comments below)

## 2020-09-16 NOTE — Telephone Encounter (Signed)
Per Ermalinda Barrios, PA-C, I called pt to discuss her Rehab. Cardiac Rehab will not be able to get her in until 2-75mths but they recommend sending pt to [place]. The pt agreed to this and will also go to Cardiac Rehab when they get her in.  The Referral form has been completed and faxed to [facility].

## 2020-09-16 NOTE — Telephone Encounter (Signed)
   Patient Name: Jessica [name]  DOB: 05-25-1940 MRN: [id number]  Primary Cardiologist: Sherren Mocha, MD  Chart reviewed as part of pre-operative protocol coverage.   Simple dental extractions are considered low risk procedures per guidelines and generally do not require any specific cardiac clearance. It is also generally accepted that for simple extractions and dental cleanings, there is no need to interrupt blood thinner therapy.  SBE prophylaxis is not required for the patient from a cardiac standpoint.  I will route this recommendation to the requesting party via Epic fax function and remove from pre-op pool.  Please call with questions.  [place], PA 09/16/2020, 12:02 PM

## 2020-09-23 ENCOUNTER — Telehealth: Payer: Self-pay

## 2020-09-23 NOTE — Telephone Encounter (Signed)
[place], Amy E, NP  You 1 hour ago (12:05 PM)     If pain is minimal, she can wait to be seen next week.      [place], Amy E, NP  You 1 hour ago (12:05 PM)     She will need to be seen either in clinic or office. Most insurances will not approve TSH lab work unless supported with clinical documentation.    Above is Amy's response sent via routing comment. I called patient and discussed response, patient verbalized understanding and states she will wait until next week. Patient aware she can call if symptoms progress and we can see her at the [facility] office prior if needed.

## 2020-09-23 NOTE — Telephone Encounter (Signed)
Patient called requesting appointment to check her TSH. Patient states she was at the dentist and when they felt her neck it was tender. Patient questions if something is going on with her thyroid.  Patient stated she has a pending appointment next week with Dr.Gupta, yet she would like labs prior.  Patient was informed that she may need to be seen and evaluated prior to labs as tenderness is her neck could be coming from other issues.  We do not have any pending clinics at [facility] prior to next week and patient does not with to come to the [facility] office    Please advise

## 2020-09-24 NOTE — Research (Signed)
AM[employer] Lp(a)  Informed Consent   Subject Name: Jessica [name]  Subject met inclusion and exclusion criteria.  The informed consent form, study requirements and expectations were reviewed with the subject and questions and concerns were addressed prior to the signing of the consent form.  The subject verbalized understanding of the trial requirements.  The subject agreed to participate in the [facility] Lp(a) trial and signed the informed consent at 1305 on 09/15/2020.  The informed consent was obtained prior to performance of any protocol-specific procedures for the subject.  A copy of the signed informed consent was given to the subject and a copy was placed in the subject's medical record.   [name]  [employer] Consent Version 2 Protocol Version 2     Subject # [phone] [employer] Lp(a) [id number] Site # [id number]  SEX       []    Female                                               [x]    Female  Ethnicity       []   Hispanic or Latino               [x]   Not Hispanic or Latino  Race       [x]   White                    []   Black or African American         []   Asian       []   American [place] or [place] Native                  []   Native Hawaiian or Other Pacific Islander                      []   Other  Other   Age 80  Subject Group       [x]    Local Lab                          []      Historical Lp(a)  value  Future research       [x]    Yes                                                  []     No    Amgen [id number] Site # [id number] Subject # [id number]  ELIGIBILITY CRITERIA WORKSHEET Code INCLUSION CRITERIA   101 Subject has provided informed consent prior to the initiation of any study specific activities/procedures [x]   102 Age 68 to 48 years [x]   103 MI (presumed type 1) [x]   103 PCI (with high-risk features) with at least 1 of the following [x]    Age >58 [x]    Diabetes mellitus []    History of ischemic stroke []    History of peripheral arterial disease []    Residual stenosis ? 50% []     Multivessel PCI (ie, ? 2 vessels, including branch arteries []   CODE EXCLUSION CRITERIA                N/A X  201 Subjects  known to be currently receiving investigational drug in a clinical study that is anticipated to last > 1 year []   202 Known Lp(a) value <74m/dL or < 200nmol/L []   203 Subject has a diagnosis of end-stage renal disease or requires dialysis. []   204 Poorly controlled (glycated hemoglobin [HbA1c] > 10%) diabetes mellitus (type 1 or type 2) []   205 Subject is receiving or has received lipoprotein apheresis to reduce Lp(a) within 3 months prior to enrollment. []   206 Known uncontrolled or recurrent ventricular tachycardia in the past 3 months prior to enrollment. []   207 Known malignancy (except non-melanoma skin cancers, cervical in situ carcinoma, breast ductal carcinoma in situ, or stage 1 prostate carcinoma) within the last 5 years prior to enrollment. []   208 Known history or evidence of clinically significant disease (eg, respiratory, gastrointestinal, or psychiatric disease) or unstable disorder or biomarker that, in the opinion of the investigator(s), would result in life expectancy < 5 years. []   209  Known hemorrhagic stroke. []    2

## 2020-09-25 ENCOUNTER — Other Ambulatory Visit: Payer: Self-pay | Admitting: Cardiology

## 2020-09-30 ENCOUNTER — Non-Acute Institutional Stay: Payer: Medicare Other | Admitting: Internal Medicine

## 2020-09-30 ENCOUNTER — Other Ambulatory Visit: Payer: Self-pay

## 2020-09-30 ENCOUNTER — Encounter: Payer: Self-pay | Admitting: Internal Medicine

## 2020-09-30 VITALS — BP 114/70 | HR 69 | Temp 95.9°F | Ht 62.0 in | Wt 152.2 lb

## 2020-09-30 DIAGNOSIS — R531 Weakness: Secondary | ICD-10-CM | POA: Diagnosis not present

## 2020-09-30 DIAGNOSIS — E782 Mixed hyperlipidemia: Secondary | ICD-10-CM

## 2020-09-30 DIAGNOSIS — E611 Iron deficiency: Secondary | ICD-10-CM

## 2020-09-30 DIAGNOSIS — G6289 Other specified polyneuropathies: Secondary | ICD-10-CM

## 2020-09-30 DIAGNOSIS — G3184 Mild cognitive impairment, so stated: Secondary | ICD-10-CM

## 2020-09-30 DIAGNOSIS — E039 Hypothyroidism, unspecified: Secondary | ICD-10-CM | POA: Diagnosis not present

## 2020-09-30 DIAGNOSIS — M810 Age-related osteoporosis without current pathological fracture: Secondary | ICD-10-CM

## 2020-09-30 DIAGNOSIS — I214 Non-ST elevation (NSTEMI) myocardial infarction: Secondary | ICD-10-CM

## 2020-09-30 DIAGNOSIS — K429 Umbilical hernia without obstruction or gangrene: Secondary | ICD-10-CM | POA: Diagnosis not present

## 2020-09-30 DIAGNOSIS — E538 Deficiency of other specified B group vitamins: Secondary | ICD-10-CM

## 2020-10-02 NOTE — Telephone Encounter (Signed)
I am covering for Ermalinda Barrios, PA-C while she is away.  Pt just had a NSTEMI in June.  Main focus should be on aerobic activity.  Goal HR would be between 85-115.  Would start out on Treadmill/Elliptical/Bike for 5-10 min >> increase to twice a day as tolerated then increase time slowly (few min added every few days) in order to achieve 30 min a day.  Strength training can be added in slowly with upper body exercises limited to 10 lbs or less for now.  Lower body can be 20 lbs or less.  Would work on aerobic activity for 2-3 weeks before adding in weights.  Richardson Dopp, PA-C    10/02/2020 9:03 AM

## 2020-10-02 NOTE — Progress Notes (Signed)
Location:  [facility] of Service:  Clinic (12)  Provider:   Code Status:  Goals of Care:  Advanced Directives 09/30/2020  Does Patient Have a Medical Advance Directive? No;Yes  Type of Paramedic of [place];Living will  Does patient want to make changes to medical advance directive? No - Patient declined  Copy of [place] in Chart? Yes - validated most recent copy scanned in chart (See row information)  Would patient like information on creating a medical advance directive? -  Pre-existing out of facility DNR order (yellow form or pink MOST form) -     Chief Complaint  Patient presents with   Medical Management of Chronic Issues    Patient returns to the clinic for follow up.    Health Maintenance    Hep C screen, Colonoscopy    HPI: Patient is a 80 y.o. female seen today for medical management of chronic diseases.    Patient was admitted in the hospital from 6/27-6/29 for NSTEMI   Patient has a history of distal humerus fracture History of umbilical hernia Surgery said no issues History of osteoporosis on Fosamax Hyperlipidemia, hypothyroidism, history of peripheral neuropathy history of shortness of breath with exertion  Patient came to ED with chest pressure.    Underwent cardiac cath which showed subtotal occlusion of an LAD.  Underwent successful angioplasty and DES.  Does have residual 50% RCA lesion Plan per cardiology of DAPT with aspirin and Brilinta for 1 year Also was started on Crestor Her EF was 40 to 45% with hypokinesis of LV Was started on Toprol and Aldactone.  Aldactone was stopped by cardiology on last visit due to low blood pressures Patient did not have any complaints today.  Continues to only have low endurance and fatigue.  Does feel short of breath on exertion but denies any chest pain  [place] Her Son who is POA is in [place] Past Medical History:  Diagnosis Date   Anemia 12/2015    Bronchitis, chronic (HCC)    in her 67s   Cataract    Chronic kidney disease    in 20s had nephritis   Closed fracture of right distal humerus    right   Hearing loss 05/03/2016   High cholesterol    Hypothyroidism    Osteopenia    Pericarditis 01/1999   Pneumothorax 01/1999   Thyroid disorder    Vitamin B 12 deficiency     Past Surgical History:  Procedure Laterality Date   birthmark      removed,left leg   CATARACT EXTRACTION  2017   CORONARY STENT INTERVENTION N/A 09/01/2020   Procedure: CORONARY STENT INTERVENTION;  Surgeon: Belva Crome, MD;  Location: [facility];  Service: Cardiovascular;  Laterality: N/A;   LEFT HEART CATH AND CORONARY ANGIOGRAPHY N/A 09/01/2020   Procedure: LEFT HEART CATH AND CORONARY ANGIOGRAPHY;  Surgeon: Belva Crome, MD;  Location: [facility];  Service: Cardiovascular;  Laterality: N/A;   ORIF HUMERUS FRACTURE Right 04/10/2019   Procedure: RIGHT OPEN REDUCTION INTERNAL FIXATION (ORIF) DISTAL HUMERUS FRACTURE WITH EXTENSION;  Surgeon: Hiram Gash, MD;  Location: [facility];  Service: Orthopedics;  Laterality: Right;   [place]?    Allergies  Allergen Reactions   Atorvastatin     Muscle weakness   Rosuvastatin     Muscle weaknes    Outpatient Encounter Medications as of 09/30/2020  Medication Sig   acetaminophen (TYLENOL) 650 MG CR tablet Take 1,300 mg by mouth every 8 (eight) hours as needed for pain.   alendronate (FOSAMAX) 70 MG tablet TAKE 1 TABLET BY MOUTH EVERY 7 DAYS WITH A FULL GLASS OF WATER AND ON AN EMPTY STOMACH   aspirin 81 MG chewable tablet Chew 1 tablet (81 mg total) by mouth daily.   Calcium Carb-Cholecalciferol (CALCIUM 600+D) 600-800 MG-UNIT TABS Take 2 tablets by mouth daily.   cholecalciferol (VITAMIN D) 1000 UNITS tablet Take 2,000 Units by mouth daily.    FERROUS SULFATE PO Take 1 tablet by mouth daily.   l-methylfolate-B6-B12 (METANX) 3-35-2 MG TABS tablet  Take 1 tablet by mouth daily.   levothyroxine (SYNTHROID) 125 MCG tablet TAKE 1 TABLET BY MOUTH  DAILY   metoprolol succinate (TOPROL-XL) 25 MG 24 hr tablet Take 0.5 tablets (12.5 mg total) by mouth daily.   nitroGLYCERIN (NITROSTAT) 0.4 MG SL tablet Place 1 tablet (0.4 mg total) under the tongue every 5 (five) minutes as needed for chest pain.   rosuvastatin (CRESTOR) 40 MG tablet Take 1 tablet (40 mg total) by mouth daily.   ticagrelor (BRILINTA) 90 MG TABS tablet Take 1 tablet (90 mg total) by mouth 2 (two) times daily.   vitamin C (ASCORBIC ACID) 500 MG tablet Take 500 mg by mouth daily.   [DISCONTINUED] calcium carbonate (OS-CAL) 1250 (500 Ca) MG chewable tablet Chew 1 tablet (1,250 mg total) by mouth daily. (Patient not taking: No sig reported)   [DISCONTINUED] simethicone (GAS-X) 80 MG chewable tablet Chew 1 tablet (80 mg total) by mouth every 6 (six) hours as needed for flatulence.   No facility-administered encounter medications on file as of 09/30/2020.    Review of Systems:  Review of Systems Review of Systems  Constitutional: Negative for activity change, appetite change, chills, diaphoresis, fatigue and fever.  HENT: Negative for mouth sores, postnasal drip, rhinorrhea, sinus pain and sore throat.   Respiratory: Negative for apnea, cough, chest tightness, shortness of breath and wheezing.   Cardiovascular: Negative for chest pain, palpitations and leg swelling.  Gastrointestinal: Negative for abdominal distention, abdominal pain, constipation, diarrhea, nausea and vomiting.  Genitourinary: Negative for dysuria and frequency.  Musculoskeletal: Negative for arthralgias, joint swelling and myalgias.  Skin: Negative for rash.  Neurological: Negative for dizziness, syncope, weakness, light-headedness and numbness.  Psychiatric/Behavioral: Negative for behavioral problems, confusion and sleep disturbance.    Health Maintenance  Topic Date Due   Hepatitis C Screening  Never done    COLONOSCOPY (Pts 45-41yr Insurance coverage will need to be confirmed)  07/11/2020   COVID-19 Vaccine (4 - Booster for Moderna series) 10/06/2020   INFLUENZA VACCINE  10/05/2020   TETANUS/TDAP  05/30/2028   DEXA SCAN  Completed   PNA vac Low Risk Adult  Completed   Zoster Vaccines- Shingrix  Completed   HPV VACCINES  Aged Out    Physical Exam: Vitals:   09/30/20 1528  BP: 114/70  Pulse: 69  Temp: (!) 95.9 F (35.5 C)  SpO2: 96%  Weight: 152 lb 3.2 oz (69 kg)  Height: '5\' 2"'$  (1.575 m)   Body mass index is 27.84 kg/m. Physical Exam Constitutional: Oriented to person, place, and time. Well-developed and well-nourished.  HENT:  Head: Normocephalic.  Mouth/Throat: Oropharynx is clear and moist.  Eyes: Pupils are equal, round, and reactive to light.  Neck: Neck supple.  Cardiovascular: Normal rate and normal heart sounds.  No murmur heard. Pulmonary/Chest: Effort normal and breath sounds  normal. No respiratory distress. No wheezes. She has no rales.  Abdominal: Soft. Bowel sounds are normal. No distension. There is no tenderness. There is no rebound.  Musculoskeletal: No edema.  Lymphadenopathy: none Neurological: Alert and oriented to person, place, and time.  Skin: Skin is warm and dry.  Psychiatric: Normal mood and affect. Behavior is normal. Thought content normal.   Labs reviewed: Basic Metabolic Panel: Recent Labs    01/23/20 0815 07/23/20 0815 07/23/20 0815 08/31/20 2133 09/01/20 1849 09/02/20 0249 09/08/20 0839  NA 139 140  --  136  --  138 137  K 4.3 4.3  --  4.2  --  3.2* 4.5  CL 104 105  --  103  --  108 102  CO2 29 24  --  25  --  23 23  GLUCOSE 89 82  --  133*  --  89 81  BUN 18 18  --  19  --  6* 18  CREATININE 0.58* 0.55*   < > 0.62 0.50 0.50 0.69  CALCIUM 9.2 8.9  --  9.0  --  8.2* 9.3  TSH 2.61 1.08  --   --   --   --   --    < > = values in this interval not displayed.   Liver Function Tests: Recent Labs    01/23/20 0815 07/23/20 0815  08/31/20 2133  AST '13 14 21  '$ ALT '13 13 19  '$ ALKPHOS  --   --  53  BILITOT 0.7 0.5 0.6  PROT 6.0* 6.2 6.7  ALBUMIN  --   --  4.0   No results for input(s): LIPASE, AMYLASE in the last 8760 hours. No results for input(s): AMMONIA in the last 8760 hours. CBC: Recent Labs    01/23/20 0815 07/23/20 0815 08/31/20 2133 09/01/20 1849 09/02/20 0249  WBC 8.3 9.1 10.2 14.2* 11.4*  NEUTROABS 4,880 5,870 7.1  --   --   HGB 13.8 13.8 13.8 12.9 12.5  HCT 40.3 40.4 42.3 37.6 37.0  MCV 96.2 97.8 99.8 97.4 97.1  PLT 299 290 280 246 217   Lipid Panel: Recent Labs    01/23/20 0815 07/23/20 0815  CHOL 238* 267*  HDL 53 56  LDLCALC 158* 185*  TRIG 141 126  CHOLHDL 4.5 4.8   Lab Results  Component Value Date   HGBA1C 5.7 (H) 09/01/2020    Procedures since last visit: No results found.  Assessment/Plan NSTEMI (non-ST elevated myocardial infarction) (HCC) DPAT with Aspirin and brinlinta for 1 year On Statin And Beta blocker Taken off Aldactone due to Low BP Plans to get Cardica Rehab  Mixed hyperlipidemia On Crestor now  Senile osteoporosis DEXA showed T score  Right Femur -2.6 L1 is -2.4 Continue Fosamax Will discuss Prolia next visit  Hypothyroidism, unspecified type TSH Normal in 5/22  Mild cognitive impairment Last MMSE was 25/30 Still drives and is independent Vitamin B 12 deficiency On Supplement Umbilical hernia without obstruction and without gangrene Saw Surgery Wants to wait for now Mild Polyneuropathy On B12 supplement Iron deficiency Hgb Normal But says Iron helps with Fatigue Patient is due for Colonoscopy Will Discuss in Next few months as she cannot be taken off Brilinta right now  Weakness Planning for Rehab  Mammogram ?  Labs/tests ordered:  * No order type specified * Next appt:  11/26/2020

## 2020-10-07 ENCOUNTER — Ambulatory Visit (HOSPITAL_COMMUNITY): Payer: Medicare Other | Attending: Physician Assistant

## 2020-10-07 ENCOUNTER — Other Ambulatory Visit: Payer: Self-pay

## 2020-10-07 DIAGNOSIS — I255 Ischemic cardiomyopathy: Secondary | ICD-10-CM | POA: Insufficient documentation

## 2020-10-07 LAB — ECHOCARDIOGRAM COMPLETE
Area-P 1/2: 3.76 cm2
S' Lateral: 2.7 cm

## 2020-10-07 MED ORDER — PERFLUTREN LIPID MICROSPHERE
1.0000 mL | INTRAVENOUS | Status: AC | PRN
  Administered 2020-10-07: 2 mL via INTRAVENOUS

## 2020-10-14 ENCOUNTER — Telehealth (HOSPITAL_COMMUNITY): Payer: Self-pay

## 2020-10-14 NOTE — Telephone Encounter (Signed)
Called patient to see if she was interested in participating in the Cardiac Rehab Program. Patient stated yes. Patient will come in for orientation on 11/17/2020'@8'$ :30am and will attend the 8:45am exercise class.   Tourist information centre manager.

## 2020-10-22 ENCOUNTER — Other Ambulatory Visit: Payer: Self-pay | Admitting: Cardiology

## 2020-10-22 DIAGNOSIS — D2262 Melanocytic nevi of left upper limb, including shoulder: Secondary | ICD-10-CM | POA: Diagnosis not present

## 2020-10-22 DIAGNOSIS — D225 Melanocytic nevi of trunk: Secondary | ICD-10-CM | POA: Diagnosis not present

## 2020-10-22 DIAGNOSIS — D1801 Hemangioma of skin and subcutaneous tissue: Secondary | ICD-10-CM | POA: Diagnosis not present

## 2020-10-22 DIAGNOSIS — D692 Other nonthrombocytopenic purpura: Secondary | ICD-10-CM | POA: Diagnosis not present

## 2020-10-22 DIAGNOSIS — L821 Other seborrheic keratosis: Secondary | ICD-10-CM | POA: Diagnosis not present

## 2020-10-23 ENCOUNTER — Encounter: Payer: Self-pay | Admitting: Internal Medicine

## 2020-10-26 ENCOUNTER — Encounter: Payer: Self-pay | Admitting: Gastroenterology

## 2020-10-29 MED ORDER — FUROSEMIDE 20 MG PO TABS
20.0000 mg | ORAL_TABLET | Freq: Every day | ORAL | 2 refills | Status: DC | PRN
Start: 2020-10-29 — End: 2020-12-02

## 2020-10-29 NOTE — Telephone Encounter (Signed)
Reviewed chart. Agree with your recommendations. I would be ok with her using furosemide 20 mg as needed for swelling.

## 2020-10-29 NOTE — Telephone Encounter (Signed)
Pt aware that lasix 20 mg po daily PRN for swelling was sent into her confirmed pharmacy of choice.  She was made aware via mychart message and agreed to this plan.

## 2020-10-29 NOTE — Telephone Encounter (Signed)
Pt made aware of med recommendations per Dr. Burt Knack via [place] message.  Advised her to message [place] back if she is interested in [place] calling in the lasix 20 mg po daily prn for swelling to her pharmacy, and which pharmacy she would like this called into. Will await pt response.

## 2020-10-29 NOTE — Addendum Note (Signed)
Addended by: Nuala Alpha on: 10/29/2020 10:38 AM   Modules accepted: Orders

## 2020-11-11 NOTE — Progress Notes (Signed)
Cardiology Office Note    Date:  11/24/2020   ID:  Jessica, [name] 1941/01/23, MRN [id number]   PCP:  Virgie Dad, MD   [facility]  Cardiologist:  Sherren Mocha, MD   Advanced Practice Provider:  No care team member to display Electrophysiologist:  None   [id number]   Chief Complaint  Patient presents with   Follow-up     History of Present Illness:  Jessica [name] is a 80 y.o. female  with hypothyroidism, HLD, and prior pericarditis 2000.    She was admitted with NSTEMI 09/01/20 treated DES mLAD residual 50% and 40% p/mRCA. DAPT for one year.ICM EF 40-45% G1DD, HLD LDL 185.   I saw the patient 09/15/2020 and she was complaining of fatigue.  Blood pressure was running low.  I stopped her spironolactone.  Plan to check echo in 3 months.  She was also anxious to start cardiac rehab and was doing some exercises at her facility.  Patient had some ankle swelling after eaten some salty snacks and Dr. Burt Knack called in Lasix 20 mg as needed.  Blood pressure readings she sent in were all stable.  Follow-up echo 10/07/2020 normal LVEF 50 to 55%.  No significant valvular abnormalities.  Patient comes in for f/u. Started cardiac rehab yesterday and is very pleased. Doing aerobic exercise as well. Not taking lasix. Short of breath if bends over or going up a hill. Doesn't have stamina she used to. No chest pain or palpitations. Asking if she can visit family in [place]. Not using lasix.She's asking about when the [employer] trial is to start. Her lipoprotein a was 381.   Past Medical History:  Diagnosis Date   Anemia 12/2015   Bronchitis, chronic (HCC)    in her 27s   Cataract    Chronic kidney disease    in 20s had nephritis   Closed fracture of right distal humerus    right   Coronary artery disease    Hearing loss 05/03/2016   High cholesterol    Hypothyroidism    Osteopenia    Pericarditis 01/1999   Pneumothorax 01/1999   Thyroid disorder     Vitamin B 12 deficiency     Past Surgical History:  Procedure Laterality Date   birthmark      removed,left leg   CARDIAC CATHETERIZATION     CATARACT EXTRACTION  2017   CORONARY STENT INTERVENTION N/A 09/01/2020   Procedure: CORONARY STENT INTERVENTION;  Surgeon: Belva Crome, MD;  Location: [facility];  Service: Cardiovascular;  Laterality: N/A;   LEFT HEART CATH AND CORONARY ANGIOGRAPHY N/A 09/01/2020   Procedure: LEFT HEART CATH AND CORONARY ANGIOGRAPHY;  Surgeon: Belva Crome, MD;  Location: [facility];  Service: Cardiovascular;  Laterality: N/A;   ORIF HUMERUS FRACTURE Right 04/10/2019   Procedure: RIGHT OPEN REDUCTION INTERNAL FIXATION (ORIF) DISTAL HUMERUS FRACTURE WITH EXTENSION;  Surgeon: Hiram Gash, MD;  Location: [facility];  Service: Orthopedics;  Laterality: Right;   [place]?    Current Medications: Current Meds  Medication Sig   acetaminophen (TYLENOL) 650 MG CR tablet Take 1,300 mg by mouth every 8 (eight) hours as needed for pain.   alendronate (FOSAMAX) 70 MG tablet TAKE 1 TABLET BY MOUTH EVERY 7 DAYS WITH A FULL GLASS OF WATER AND ON AN EMPTY STOMACH (Patient taking differently: Take 70 mg by mouth every Saturday.)   aspirin 81  MG chewable tablet Chew 1 tablet (81 mg total) by mouth daily.   Calcium Carb-Cholecalciferol (CALCIUM 600+D) 600-800 MG-UNIT TABS Take 2 tablets by mouth daily.   cholecalciferol (VITAMIN D) 1000 UNITS tablet Take 2,000 Units by mouth daily.    ferrous sulfate 325 (65 FE) MG tablet Take 325 mg by mouth daily with breakfast.   l-methylfolate-B6-B12 (METANX) 3-35-2 MG TABS tablet Take 1 tablet by mouth daily.   levothyroxine (SYNTHROID) 125 MCG tablet TAKE 1 TABLET BY MOUTH  DAILY (Patient taking differently: Take 125 mcg by mouth daily before breakfast.)   metoprolol succinate (TOPROL-XL) 25 MG 24 hr tablet TAKE 1/2 TABLET BY MOUTH DAILY (Patient taking differently: Take 12.5  mg by mouth daily.)   nitroGLYCERIN (NITROSTAT) 0.4 MG SL tablet Place 1 tablet (0.4 mg total) under the tongue every 5 (five) minutes as needed for chest pain.   rosuvastatin (CRESTOR) 40 MG tablet Take 1 tablet (40 mg total) by mouth daily.   ticagrelor (BRILINTA) 90 MG TABS tablet Take 1 tablet (90 mg total) by mouth 2 (two) times daily.   vitamin C (ASCORBIC ACID) 500 MG tablet Take 500 mg by mouth daily.     Allergies:   Atorvastatin   Social History   Socioeconomic History   Marital status: Divorced    Spouse name: Not on file   Number of children: 1   Years of education: college   Highest education level: Not on file  Occupational History   Occupation: retired  Tobacco Use   Smoking status: Never   Smokeless tobacco: Never  Vaping Use   Vaping Use: Never used  Substance and Sexual Activity   Alcohol use: Yes    Alcohol/week: 3.0 standard drinks    Types: 3 Glasses of wine per week    Comment: social   Drug use: No   Sexual activity: Not on file  Other Topics Concern   Not on file  Social History Narrative   Moved to [facility] 01/14/2015   Do you drink/eat things with caffeine?  yes   Marital status?      divorced                               Do you live in a house, apartment, assisted living, condo, trailer, etc.?  Retirement community   Is it one or more stories? Yes, 3   How many persons live in your home?  1   Do you have any pets in your home? (please list) 0   Current or past profession:  Librarian   Do you exercise?     yes                                 Type & how often? Walk-varies   Do you have a living will? yes   Do you have a DNR form?         no                         If not, do you want to discuss one? yes   Do you have signed POA/HPOA for forms? yes   Social Determinants of Health   Financial Resource Strain: Not on file  Food Insecurity: Not on file  Transportation Needs: Not on file  Physical Activity: Not on file  Stress: Not on  file  Social Connections: Not on file     Family History:  The patient's  family history includes Arthritis in her paternal grandmother; Asthma in her mother; Ataxia in her father; Cancer - Cervical in her sister; Diabetes in her maternal grandmother, mother, and another family member; Stroke in her father; Thyroid disease in her sister.   ROS:   Please see the history of present illness.    ROS All other systems reviewed and are negative.   PHYSICAL EXAM:   VS:  BP 122/88   Pulse 64   Ht '5\' 4"'$  (1.626 m)   Wt 152 lb 12.8 oz (69.3 kg)   SpO2 96%   BMI 26.23 kg/m   Physical Exam  GEN: Well nourished, well developed, in no acute distress  Neck: no JVD, carotid bruits, or masses Cardiac:RRR; no murmurs, rubs, or gallops  Respiratory:  clear to auscultation bilaterally, normal work of breathing GI: soft, nontender, nondistended, + BS Ext: without cyanosis, clubbing, or edema, Good distal pulses bilaterally Neuro:  Alert and Oriented x 3 Psych: euthymic mood, full affect  Wt Readings from Last 3 Encounters:  11/24/20 152 lb 12.8 oz (69.3 kg)  11/17/20 153 lb 3.5 oz (69.5 kg)  09/30/20 152 lb 3.2 oz (69 kg)      Studies/Labs Reviewed:   EKG:  EKG is not ordered today.     Recent Labs: 07/23/2020: TSH 1.08 08/31/2020: ALT 19 09/02/2020: Hemoglobin 12.5; Platelets 217 09/08/2020: BUN 18; Creatinine, Ser 0.69; Potassium 4.5; Sodium 137   Lipid Panel    Component Value Date/Time   CHOL 267 (H) 07/23/2020 0815   TRIG 126 07/23/2020 0815   HDL 56 07/23/2020 0815   CHOLHDL 4.8 07/23/2020 0815   VLDL 18 02/24/2014 0926   LDLCALC 185 (H) 07/23/2020 0815   LDLDIRECT 135.4 12/26/2006 0847    Additional studies/ records that were reviewed today include:  Echo: 09/01/20   IMPRESSIONS     1. Left ventricular ejection fraction, by estimation, is 40 to 45%. The  left ventricle has mildly decreased function. The left ventricle  demonstrates regional wall motion abnormalities (see  scoring  diagram/findings for description). Left ventricular  diastolic parameters are consistent with Grade I diastolic dysfunction  (impaired relaxation). There is severe hypokinesis of the left  ventricular, apical anteroseptal wall, anterior wall, inferoseptal wall  and apical segment.   2. Right ventricular systolic function is normal. The right ventricular  size is normal.   3. The mitral valve is normal in structure. Mild mitral valve  regurgitation. No evidence of mitral stenosis.   4. The aortic valve is normal in structure. Aortic valve regurgitation is  trivial. Mild aortic valve sclerosis is present, with no evidence of  aortic valve stenosis.   5. The inferior vena cava is normal in size with greater than 50%  respiratory variability, suggesting right atrial pressure of 3 mmHg. _____________   Cath: 09/01/20     Subtotal occlusion of the mid LAD with distal TIMI grade II flow.  Stenosis is a [place] 010 bifurcation stenosis with the first diagonal. Successful angioplasty and stenting using a 22 x 2.0 mm Onyx postdilated to 2.25 mm in diameter, avoiding jailing the larger first diagonal.  0% stenosis with TIMI grade III flow was noted. Tandem 50 and 40% proximal and mid RCA stenoses.  Right coronary is codominant. Left main is widely patent.   Circumflex is widely patent.  The circumflex and obtuse marginals are tortuous.  Apical severe hypokinesis/akinesis.  LVEDP 6 mmHg.     RECOMMENDATIONS:   Aspirin and Brilinta for 12 months. Aggressive risk factor modification High intensity statin therapy     Risk Assessment/Calculations:         ASSESSMENT:    1. Coronary artery disease involving native coronary artery of native heart without angina pectoris   2. Ischemic cardiomyopathy   3. Hyperlipidemia, unspecified hyperlipidemia type      PLAN:  In order of problems listed above:  CAD S/P NSTEMI treated DES mLAD with residual 50% & 40% RCA on Brilinta and ASA  for 1 yr. Cardiac rehab. No angina or bleeding problems.   ICM LVEF 40-45% G1DD on toprol .Echo 10/07/20 normal LVEF 50-55%. Not using prn lasix   HLD LDL 185 on crestor 40 mg(muscle aches on statins in past)-she is asking when the [employer] trial will start. Will send not to Wendelyn Breslow, RN and Dr. Debara Pickett  Shared Decision Making/Informed Consent          Medication Adjustments/Labs and Tests Ordered: Current medicines are reviewed at length with the patient today.  Concerns regarding medicines are outlined above.  Medication changes, Labs and Tests ordered today are listed in the Patient Instructions below. Patient Instructions  Medication Instructions:  Your physician recommends that you continue on your current medications as directed. Please refer to the Current Medication list given to you today.  *If you need a refill on your cardiac medications before your next appointment, please call your pharmacy*   Lab Work: None ordered   If you have labs (blood work) drawn today and your tests are completely normal, you will receive your results only by: [place] (if you have MyChart) OR A paper copy in the mail If you have any lab test that is abnormal or we need to change your treatment, we will call you to review the results.   Testing/Procedures: None ordered    Follow-Up: Follow up as scheduled    Other Instructions None     Signed, Ermalinda Barrios, PA-C  11/24/2020 12:06 PM    [facility] [place], [place], [place]  [address] Phone: [phone]; Fax: [phone]

## 2020-11-13 DIAGNOSIS — L602 Onychogryphosis: Secondary | ICD-10-CM | POA: Diagnosis not present

## 2020-11-13 DIAGNOSIS — M2011 Hallux valgus (acquired), right foot: Secondary | ICD-10-CM | POA: Diagnosis not present

## 2020-11-13 DIAGNOSIS — M2041 Other hammer toe(s) (acquired), right foot: Secondary | ICD-10-CM | POA: Diagnosis not present

## 2020-11-13 DIAGNOSIS — M79671 Pain in right foot: Secondary | ICD-10-CM | POA: Diagnosis not present

## 2020-11-13 DIAGNOSIS — M79672 Pain in left foot: Secondary | ICD-10-CM | POA: Diagnosis not present

## 2020-11-16 ENCOUNTER — Telehealth (HOSPITAL_COMMUNITY): Payer: Self-pay | Admitting: *Deleted

## 2020-11-16 NOTE — Telephone Encounter (Signed)
Left message to call cardiac rehab. Barnet Pall, RN,BSN 11/16/2020 4:27 PM

## 2020-11-17 ENCOUNTER — Encounter (HOSPITAL_COMMUNITY): Payer: Self-pay

## 2020-11-17 ENCOUNTER — Encounter (HOSPITAL_COMMUNITY)
Admission: RE | Admit: 2020-11-17 | Discharge: 2020-11-17 | Disposition: A | Discharge status: Home / Self Care | Payer: Medicare Other | Source: Ambulatory Visit | Attending: Cardiovascular Disease | Admitting: Cardiovascular Disease

## 2020-11-17 ENCOUNTER — Other Ambulatory Visit: Payer: Self-pay

## 2020-11-17 VITALS — BP 108/84 | HR 63 | Ht 64.0 in | Wt 153.2 lb

## 2020-11-17 DIAGNOSIS — I214 Non-ST elevation (NSTEMI) myocardial infarction: Secondary | ICD-10-CM | POA: Insufficient documentation

## 2020-11-17 DIAGNOSIS — Z955 Presence of coronary angioplasty implant and graft: Secondary | ICD-10-CM | POA: Insufficient documentation

## 2020-11-17 HISTORY — DX: Atherosclerotic heart disease of native coronary artery without angina pectoris: I25.10

## 2020-11-17 NOTE — Progress Notes (Signed)
Cardiac Individual Treatment Plan  Patient Details  Name: Jessica [name] MRN: [id number] Date of Birth: 11/21/1940 Referring Provider:   [facility] CARDI[facility] REHAB PHASE II ORIENTATION from 11/17/2020 in [facility]  Referring Provider Sherren Mocha, MD       Initial Encounter Date:  [place] PHASE II ORIENTATION from 11/17/2020 in [facility]  Date 11/17/20       Visit Diagnosis: 08/31/20 NSTEMI   09/01/20 S/P DES LAD  Patient's Home Medications on Admission:  Current Outpatient Medications:    acetaminophen (TYLENOL) 650 MG CR tablet, Take 1,300 mg by mouth every 8 (eight) hours as needed for pain., Disp: , Rfl:    alendronate (FOSAMAX) 70 MG tablet, TAKE 1 TABLET BY MOUTH EVERY 7 DAYS WITH A FULL GLASS OF WATER AND ON AN EMPTY STOMACH (Patient taking differently: Take 70 mg by mouth every Saturday.), Disp: 12 tablet, Rfl: 4   aspirin 81 MG chewable tablet, Chew 1 tablet (81 mg total) by mouth daily., Disp: 90 tablet, Rfl: 2   Calcium Carb-Cholecalciferol (CALCIUM 600+D) 600-800 MG-UNIT TABS, Take 2 tablets by mouth daily., Disp: , Rfl:    cholecalciferol (VITAMIN D) 1000 UNITS tablet, Take 2,000 Units by mouth daily. , Disp: , Rfl:    ferrous sulfate 325 (65 FE) MG tablet, Take 325 mg by mouth daily with breakfast., Disp: , Rfl:    l-methylfolate-B6-B12 (METANX) 3-35-2 MG TABS tablet, Take 1 tablet by mouth daily., Disp: , Rfl:    levothyroxine (SYNTHROID) 125 MCG tablet, TAKE 1 TABLET BY MOUTH  DAILY (Patient taking differently: Take 125 mcg by mouth daily before breakfast.), Disp: 90 tablet, Rfl: 3   metoprolol succinate (TOPROL-XL) 25 MG 24 hr tablet, TAKE 1/2 TABLET BY MOUTH DAILY (Patient taking differently: Take 12.5 mg by mouth daily.), Disp: 45 tablet, Rfl: 0   nitroGLYCERIN (NITROSTAT) 0.4 MG SL tablet, Place 1 tablet (0.4 mg total) under the tongue every 5 (five) minutes as needed for  chest pain., Disp: 25 tablet, Rfl: 2   rosuvastatin (CRESTOR) 40 MG tablet, Take 1 tablet (40 mg total) by mouth daily., Disp: 90 tablet, Rfl: 1   ticagrelor (BRILINTA) 90 MG TABS tablet, Take 1 tablet (90 mg total) by mouth 2 (two) times daily., Disp: 180 tablet, Rfl: 2   vitamin C (ASCORBIC ACID) 500 MG tablet, Take 500 mg by mouth daily., Disp: , Rfl:    furosemide (LASIX) 20 MG tablet, Take 1 tablet (20 mg total) by mouth daily as needed (swelling). (Patient not taking: Reported on 11/13/2020), Disp: 30 tablet, Rfl: 2   metoprolol succinate (TOPROL-XL) 25 MG 24 hr tablet, TAKE ONE-HALF TABLET BY MOUTH DAILY (Patient not taking: Reported on 11/13/2020), Disp: 45 tablet, Rfl: 3  Past Medical History: Past Medical History:  Diagnosis Date   Anemia 12/2015   Bronchitis, chronic (HCC)    in her 44s   Cataract    Chronic kidney disease    in 20s had nephritis   Closed fracture of right distal humerus    right   Coronary artery disease    Hearing loss 05/03/2016   High cholesterol    Hypothyroidism    Osteopenia    Pericarditis 01/1999   Pneumothorax 01/1999   Thyroid disorder    Vitamin B 12 deficiency     Tobacco Use: Social History   Tobacco Use  Smoking Status Never  Smokeless Tobacco Never    Labs: Recent Review  Flowsheet Data     Labs for ITP Cardiac and Pulmonary Rehab Latest Ref Rng & Units 03/14/2019 07/18/2019 01/23/2020 07/23/2020 09/01/2020   Cholestrol <200 mg/dL 252(H) 233(H) 238(H) 267(H) -   LDLCALC mg/dL (calc) 176(H) 156(H) 158(H) 185(H) -   LDLDIRECT mg/dL - - - - -   HDL > OR = 50 mg/dL 52 58 53 56 -   Trlycerides <150 mg/dL 111 89 141 126 -   Hemoglobin A1c 4.8 - 5.6 % - - - - 5.7(H)       Capillary Blood Glucose: No results found for: GLUCAP   Exercise Target Goals: Exercise Program Goal: Individual exercise prescription set using results from initial 6 min walk test and THRR while considering  patient's activity barriers and safety.   Exercise  Prescription Goal: Starting with aerobic activity 30 plus minutes a day, 3 days per week for initial exercise prescription. Provide home exercise prescription and guidelines that participant acknowledges understanding prior to discharge.  Activity Barriers & Risk Stratification:  Activity Barriers & Cardiac Risk Stratification - 11/17/20 1055       Activity Barriers & Cardiac Risk Stratification   Activity Barriers Balance Concerns;Decreased Ventricular Function;Shortness of Breath;Deconditioning    Cardiac Risk Stratification High             6 Minute Walk:  6 Minute Walk     Row Name 11/17/20 1053         6 Minute Walk   Phase Initial     Distance 1528 feet     Walk Time 6 minutes     # of Rest Breaks 0     MPH 2.9     METS 2.72     RPE 8     Perceived Dyspnea  1     VO2 Peak 9.52     Symptoms Yes (comment)     Comments left hip pain 1/10     Resting HR 69 bpm     Resting BP 108/84     Resting Oxygen Saturation  97 %     Exercise Oxygen Saturation  during 6 min walk 94 %     Max Ex. HR 91 bpm     Max Ex. BP 130/81     2 Minute Post BP 131/84              Oxygen Initial Assessment:   Oxygen Re-Evaluation:   Oxygen Discharge (Final Oxygen Re-Evaluation):   Initial Exercise Prescription:  Initial Exercise Prescription - 11/17/20 1000       Date of Initial Exercise RX and Referring Provider   Date 11/17/20    Referring Provider Sherren Mocha, MD    Expected Discharge Date 01/15/21      Recumbant Bike   Level 1.5    RPM 60    Minutes 15    METs 2      NuStep   Level 2    SPM 75    Minutes 15    METs 2      Prescription Details   Frequency (times per week) 3    Duration Progress to 30 minutes of continuous aerobic without signs/symptoms of physical distress      Intensity   THRR 40-80% of Max Heartrate 56-113    Ratings of Perceived Exertion 11-13    Perceived Dyspnea 0-4      Progression   Progression Continue progressive  overload as per policy without signs/symptoms or physical distress.  Resistance Training   Training Prescription Yes    Weight 2 lbs    Reps 10-15             Perform Capillary Blood Glucose checks as needed.  Exercise Prescription Changes:   Exercise Comments:   Exercise Goals and Review:   Exercise Goals     Row Name 11/17/20 1055             Exercise Goals   Increase Physical Activity Yes       Intervention Provide advice, education, support and counseling about physical activity/exercise needs.;Develop an individualized exercise prescription for aerobic and resistive training based on initial evaluation findings, risk stratification, comorbidities and participant's personal goals.       Expected Outcomes Short Term: Attend rehab on a regular basis to increase amount of physical activity.;Long Term: Add in home exercise to make exercise part of routine and to increase amount of physical activity.;Long Term: Exercising regularly at least 3-5 days a week.       Increase Strength and Stamina Yes       Intervention Provide advice, education, support and counseling about physical activity/exercise needs.;Develop an individualized exercise prescription for aerobic and resistive training based on initial evaluation findings, risk stratification, comorbidities and participant's personal goals.       Expected Outcomes Short Term: Increase workloads from initial exercise prescription for resistance, speed, and METs.;Short Term: Perform resistance training exercises routinely during rehab and add in resistance training at home;Long Term: Improve cardiorespiratory fitness, muscular endurance and strength as measured by increased METs and functional capacity (6MWT)       Able to understand and use rate of perceived exertion (RPE) scale Yes       Intervention Provide education and explanation on how to use RPE scale       Expected Outcomes Short Term: Able to use RPE daily in rehab  to express subjective intensity level;Long Term:  Able to use RPE to guide intensity level when exercising independently       Knowledge and understanding of Target Heart Rate Range (THRR) Yes       Intervention Provide education and explanation of THRR including how the numbers were predicted and where they are located for reference       Expected Outcomes Short Term: Able to state/look up THRR;Short Term: Able to use daily as guideline for intensity in rehab;Long Term: Able to use THRR to govern intensity when exercising independently       Understanding of Exercise Prescription Yes       Intervention Provide education, explanation, and written materials on patient's individual exercise prescription       Expected Outcomes Short Term: Able to explain program exercise prescription;Long Term: Able to explain home exercise prescription to exercise independently                Exercise Goals Re-Evaluation :    Discharge Exercise Prescription (Final Exercise Prescription Changes):   Nutrition:  Target Goals: Understanding of nutrition guidelines, daily intake of sodium '1500mg'$ , cholesterol '200mg'$ , calories 30% from fat and 7% or less from saturated fats, daily to have 5 or more servings of fruits and vegetables.  Biometrics:  Pre Biometrics - 11/17/20 1047       Pre Biometrics   Waist Circumference 40 inches    Hip Circumference 41.25 inches    Waist to Hip Ratio 0.97 %    Triceps Skinfold 26 mm    % Body Fat 40.6 %  Grip Strength 20 kg    Flexibility 11 in    Single Leg Stand 5.66 seconds              Nutrition Therapy Plan and Nutrition Goals:   Nutrition Assessments:  MEDIFICTS Score Key: ?70 Need to make dietary changes  40-70 Heart Healthy Diet ? 40 Therapeutic Level Cholesterol Diet   Picture Your Plate Scores: [id number] Unhealthy dietary pattern with much room for improvement. 41-50 Dietary pattern unlikely to meet recommendations for good health and room for  improvement. 51-60 More healthful dietary pattern, with some room for improvement.  >60 Healthy dietary pattern, although there may be some specific behaviors that could be improved.    Nutrition Goals Re-Evaluation:   Nutrition Goals Discharge (Final Nutrition Goals Re-Evaluation):   Psychosocial: Target Goals: Acknowledge presence or absence of significant depression and/or stress, maximize coping skills, provide positive support system. Participant is able to verbalize types and ability to use techniques and skills needed for reducing stress and depression.  Initial Review & Psychosocial Screening:  Initial Psych Review & Screening - 11/17/20 0910       Initial Review   Current issues with None Identified      Family Dynamics   Good Support System? Yes   Ciella lives in a retirement community and has has friends locally for support. Hetty's son lives in [place]     Barriers   Psychosocial barriers to participate in program There are no identifiable barriers or psychosocial needs.      Screening Interventions   Interventions Encouraged to exercise             Quality of Life Scores:  Quality of Life - 11/17/20 1050       Quality of Life   Select Quality of Life      Quality of Life Scores   Health/Function Pre 20.79 %    Socioeconomic Pre 26.21 %    Psych/Spiritual Pre 20.07 %    Family Pre 22 %    GLOBAL Pre 21.97 %            Scores of 19 and below usually indicate a poorer quality of life in these areas.  A difference of  2-3 points is a clinically meaningful difference.  A difference of 2-3 points in the total score of the Quality of Life Index has been associated with significant improvement in overall quality of life, self-image, physical symptoms, and general health in studies assessing change in quality of life.  PHQ-9: Recent Review Flowsheet Data     Depression screen [facility] 2/9 11/17/2020 09/17/2019 11/20/2017 03/29/2017 11/23/2015   Decreased Interest  0 0 0 0 0   Down, Depressed, Hopeless 0 0 0 0 0   PHQ - 2 Score 0 0 0 0 0   Altered sleeping - 1 - - -   Tired, decreased energy - 2 - - -   Change in appetite - 3 - - -   Feeling bad or failure about yourself  - 0 - - -   Trouble concentrating - 0 - - -   Moving slowly or fidgety/restless - 0 - - -   Suicidal thoughts - 0 - - -   PHQ-9 Score - 6 - - -      Interpretation of Total Score  Total Score Depression Severity:  1-4 = Minimal depression, 5-9 = Mild depression, 10-14 = Moderate depression, 15-19 = Moderately severe depression, 20-27 = Severe depression   Psychosocial  Evaluation and Intervention:   Psychosocial Re-Evaluation:   Psychosocial Discharge (Final Psychosocial Re-Evaluation):   Vocational Rehabilitation: Provide vocational rehab assistance to qualifying candidates.   Vocational Rehab Evaluation & Intervention:  Vocational Rehab - 11/17/20 0912       Initial Vocational Rehab Evaluation & Intervention   Assessment shows need for Vocational Rehabilitation No   [facility] is retired and does not need vocatinal rehab at this time            Education: Education Goals: Education classes will be provided on a weekly basis, covering required topics. Participant will state understanding/return demonstration of topics presented.  Learning Barriers/Preferences:  Learning Barriers/Preferences - 11/17/20 1050       Learning Barriers/Preferences   Learning Barriers Sight;Hearing   wears glasses   Learning Preferences Computer/Internet;Written Material             Education Topics: Hypertension, Hypertension Reduction -Define heart disease and high blood pressure. Discus how high blood pressure affects the body and ways to reduce high blood pressure.   Exercise and Your Heart -Discuss why it is important to exercise, the FITT principles of exercise, normal and abnormal responses to exercise, and how to exercise safely.   Angina -Discuss definition of  angina, causes of angina, treatment of angina, and how to decrease risk of having angina.   Cardiac Medications -Review what the following cardiac medications are used for, how they affect the body, and side effects that may occur when taking the medications.  Medications include Aspirin, Beta blockers, calcium channel blockers, ACE Inhibitors, angiotensin receptor blockers, diuretics, digoxin, and antihyperlipidemics.   Congestive Heart Failure -Discuss the definition of CHF, how to live with CHF, the signs and symptoms of CHF, and how keep track of weight and sodium intake.   Heart Disease and Intimacy -Discus the effect sexual activity has on the heart, how changes occur during intimacy as we age, and safety during sexual activity.   Smoking Cessation / COPD -Discuss different methods to quit smoking, the health benefits of quitting smoking, and the definition of COPD.   Nutrition I: Fats -Discuss the types of cholesterol, what cholesterol does to the heart, and how cholesterol levels can be controlled.   Nutrition II: Labels -Discuss the different components of food labels and how to read food label   Heart Parts/Heart Disease and PAD -Discuss the anatomy of the heart, the pathway of blood circulation through the heart, and these are affected by heart disease.   Stress I: Signs and Symptoms -Discuss the causes of stress, how stress may lead to anxiety and depression, and ways to limit stress.   Stress II: Relaxation -Discuss different types of relaxation techniques to limit stress.   Warning Signs of Stroke / TIA -Discuss definition of a stroke, what the signs and symptoms are of a stroke, and how to identify when someone is having stroke.   Knowledge Questionnaire Score:  Knowledge Questionnaire Score - 11/17/20 1050       Knowledge Questionnaire Score   Pre Score 21/24             Core Components/Risk Factors/Patient Goals at Admission:  Personal Goals  and Risk Factors at Admission - 11/17/20 1051       Core Components/Risk Factors/Patient Goals on Admission    Weight Management Yes;Weight Maintenance;Weight Loss    Intervention Weight Management: Develop a combined nutrition and exercise program designed to reach desired caloric intake, while maintaining appropriate intake of nutrient and fiber, sodium and  fats, and appropriate energy expenditure required for the weight goal.;Weight Management: Provide education and appropriate resources to help participant work on and attain dietary goals.;Weight Management/Obesity: Establish reasonable short term and long term weight goals.    Admit Weight 153 lb 3.5 oz (69.5 kg)    Expected Outcomes Short Term: Continue to assess and modify interventions until short term weight is achieved;Long Term: Adherence to nutrition and physical activity/exercise program aimed toward attainment of established weight goal;Weight Maintenance: Understanding of the daily nutrition guidelines, which includes 25-35% calories from fat, 7% or less cal from saturated fats, less than '200mg'$  cholesterol, less than 1.5gm of sodium, & 5 or more servings of fruits and vegetables daily;Weight Loss: Understanding of general recommendations for a balanced deficit meal plan, which promotes 1-2 lb weight loss per week and includes a negative energy balance of [phone] kcal/d;Understanding recommendations for meals to include 15-35% energy as protein, 25-35% energy from fat, 35-60% energy from carbohydrates, less than '200mg'$  of dietary cholesterol, 20-35 gm of total fiber daily;Understanding of distribution of calorie intake throughout the day with the consumption of 4-5 meals/snacks    Lipids Yes    Intervention Provide education and support for participant on nutrition & aerobic/resistive exercise along with prescribed medications to achieve LDL '70mg'$ , HDL >'40mg'$ .    Expected Outcomes Short Term: Participant states understanding of desired  cholesterol values and is compliant with medications prescribed. Participant is following exercise prescription and nutrition guidelines.;Long Term: Cholesterol controlled with medications as prescribed, with individualized exercise RX and with personalized nutrition plan. Value goals: LDL < '70mg'$ , HDL > 40 mg.             Core Components/Risk Factors/Patient Goals Review:    Core Components/Risk Factors/Patient Goals at Discharge (Final Review):    ITP Comments:  ITP Comments     Row Name 11/17/20 0910           ITP Comments Dr Fransico Him MD, Medical Director                Comments: Delcie Roch attended orientation on 11/17/2020 to review rules and guidelines for program.  Completed 6 minute walk test, Intitial ITP, and exercise prescription.  VSS. Telemetry-Sinus Rhythm. Annajane did experience mild left hip pain and mild shortness of breath this resolved with rest.. Safety measures and social distancing in place per CDC guidelines. Harrell Gave RN BSN

## 2020-11-17 NOTE — Progress Notes (Signed)
Cardiac Rehab Medication Review by a Nurse  Does the patient  feel that his/her medications are working for him/her?  yes  Has the patient been experiencing any side effects to the medications prescribed?  no  Does the patient measure his/her own blood pressure or blood glucose at home?   NO  Does the patient have any problems obtaining medications due to transportation or finances?   no  Understanding of regimen: good Understanding of indications: good Potential of compliance: excellent    Nurse comments: Timira is taking her medications as prescribed and has a good understanding of what her medications are for. Mariany does not have a BP monitor but does have access to a nurse to check her BP at the retirement community where she lives.    Christa See [facility] RN 11/17/2020 9:05 AM

## 2020-11-23 ENCOUNTER — Other Ambulatory Visit: Payer: Self-pay

## 2020-11-23 ENCOUNTER — Encounter (HOSPITAL_COMMUNITY)
Admission: RE | Admit: 2020-11-23 | Discharge: 2020-11-23 | Disposition: A | Discharge status: Home / Self Care | Payer: Medicare Other | Source: Ambulatory Visit | Attending: Cardiovascular Disease | Admitting: Cardiovascular Disease

## 2020-11-23 DIAGNOSIS — Z955 Presence of coronary angioplasty implant and graft: Secondary | ICD-10-CM

## 2020-11-23 DIAGNOSIS — I214 Non-ST elevation (NSTEMI) myocardial infarction: Secondary | ICD-10-CM

## 2020-11-23 NOTE — Progress Notes (Signed)
Daily Session Note  Patient Details  Name: Jessica [name] MRN: [id number] Date of Birth: March 05, 1941 Referring Provider:   Flowsheet Row CARDIAC REHAB PHASE II ORIENTATION from 11/17/2020 in [facility]  Referring Provider Sherren Mocha, MD       Encounter Date: 11/23/2020  Check In:  Session Check In - 11/23/20 0847       Check-In   Supervising physician immediately available to respond to emergencies [facility] immediately available    Physician(s) Dr Sarajane Jews    Location [facility]    Staff Present Barnet Pall, RN, Milus Glazier, MS, ACSM-CEP, CCRP, Exercise Physiologist;Olinty Celesta Aver, MS, ACSM CEP, Exercise Physiologist;Jetta Gilford Rile BS, ACSM EP-C, Exercise Physiologist;Kaylee Rosana Hoes, MS, ACSM-CEP, Exercise Physiologist    Virtual Visit No    Medication changes reported     No    Fall or balance concerns reported    No    Tobacco Cessation No Change    Current number of cigarettes/nicotine per day     0    Warm-up and Cool-down Performed on first and last piece of equipment    Resistance Training Performed Yes    VAD Patient? No    PAD/SET Patient? No      Pain Assessment   Currently in Pain? No/denies    Pain Score 0-No pain    Multiple Pain Sites No             Capillary Blood Glucose: No results found for this or any previous visit (from the past 24 hour(s)).   Exercise Prescription Changes - 11/23/20 1000       Response to Exercise   Blood Pressure (Admit) 112/80    Blood Pressure (Exercise) 128/72    Blood Pressure (Exit) 119/81    Heart Rate (Admit) 63 bpm    Heart Rate (Exercise) 78 bpm    Heart Rate (Exit) 65 bpm    Rating of Perceived Exertion (Exercise) 12    Symptoms None    Comments Pt's first day in the [facility] program    Duration Progress to 30 minutes of  aerobic without signs/symptoms of physical distress    Intensity THRR unchanged      Progression   Progression Continue  to progress workloads to maintain intensity without signs/symptoms of physical distress.    Average METs 1.9      Resistance Training   Training Prescription Yes    Weight 2 lbs    Reps 10-15    Time 10 Minutes      Interval Training   Interval Training No      Recumbant Bike   Level 1.5    RPM 60    Minutes 15    METs 1.8      NuStep   Level 2    SPM 75    Minutes 15    METs 2             Social History   Tobacco Use  Smoking Status Never  Smokeless Tobacco Never    Goals Met:  Exercise tolerated well No report of concerns or symptoms today Strength training completed today  Goals Unmet:  Not Applicable  Comments: [name] started cardiac rehab today.  Pt tolerated light exercise without difficulty. VSS, telemetry-Sinus rhythm, asymptomatic.  Medication list reconciled. Pt denies barriers to medicaiton compliance.  PSYCHOSOCIAL ASSESSMENT:  PHQ-0. Pt exhibits positive coping skills, hopeful outlook with supportive family. No psychosocial needs identified at this time, no psychosocial  interventions necessary.    Pt enjoys plants, reading and knitting.   Pt oriented to exercise equipment and routine.    Understanding verbalized. Barnet Pall, RN,BSN 11/23/2020 1:16 PM    Dr. Fransico Him is Medical Director for Cardiac Rehab at [facility].

## 2020-11-24 ENCOUNTER — Ambulatory Visit (INDEPENDENT_AMBULATORY_CARE_PROVIDER_SITE_OTHER): Payer: Medicare Other | Admitting: Physician Assistant

## 2020-11-24 ENCOUNTER — Encounter: Payer: Self-pay | Admitting: Physician Assistant

## 2020-11-24 VITALS — BP 122/88 | HR 64 | Ht 64.0 in | Wt 152.8 lb

## 2020-11-24 DIAGNOSIS — E785 Hyperlipidemia, unspecified: Secondary | ICD-10-CM | POA: Diagnosis not present

## 2020-11-24 DIAGNOSIS — I255 Ischemic cardiomyopathy: Secondary | ICD-10-CM

## 2020-11-24 DIAGNOSIS — I2510 Atherosclerotic heart disease of native coronary artery without angina pectoris: Secondary | ICD-10-CM

## 2020-11-24 NOTE — Progress Notes (Signed)
Thanks Selinda Eon - I will ask if Ivin Booty can reach out to her for answers about the [employer] trial (now called [name])  -[place]

## 2020-11-24 NOTE — Patient Instructions (Signed)

## 2020-11-25 ENCOUNTER — Other Ambulatory Visit: Payer: Self-pay

## 2020-11-25 ENCOUNTER — Encounter (HOSPITAL_COMMUNITY)
Admission: RE | Admit: 2020-11-25 | Discharge: 2020-11-25 | Disposition: A | Discharge status: Home / Self Care | Payer: Medicare Other | Source: Ambulatory Visit | Attending: Cardiovascular Disease | Admitting: Cardiovascular Disease

## 2020-11-25 DIAGNOSIS — Z23 Encounter for immunization: Secondary | ICD-10-CM | POA: Diagnosis not present

## 2020-11-25 DIAGNOSIS — I214 Non-ST elevation (NSTEMI) myocardial infarction: Secondary | ICD-10-CM | POA: Diagnosis not present

## 2020-11-25 DIAGNOSIS — Z955 Presence of coronary angioplasty implant and graft: Secondary | ICD-10-CM

## 2020-11-26 DIAGNOSIS — E782 Mixed hyperlipidemia: Secondary | ICD-10-CM | POA: Diagnosis not present

## 2020-11-26 LAB — LIPID PANEL
Cholesterol: 143 mg/dL (ref <200)
HDL: 49 mg/dL — ABNORMAL LOW (ref >=50)
LDL Cholesterol (Calc): 74 mg/dL (calc)
Non-HDL Cholesterol (Calc): 94 mg/dL (calc) (ref <130)
Total CHOL/HDL Ratio: 2.9 (calc) (ref <5.0)
Triglycerides: 116 mg/dL (ref <150)

## 2020-11-27 ENCOUNTER — Other Ambulatory Visit: Payer: Self-pay

## 2020-11-27 ENCOUNTER — Encounter (HOSPITAL_COMMUNITY)
Admission: RE | Admit: 2020-11-27 | Discharge: 2020-11-27 | Disposition: A | Discharge status: Home / Self Care | Payer: Medicare Other | Source: Ambulatory Visit | Attending: Cardiovascular Disease | Admitting: Cardiovascular Disease

## 2020-11-27 DIAGNOSIS — I214 Non-ST elevation (NSTEMI) myocardial infarction: Secondary | ICD-10-CM | POA: Diagnosis not present

## 2020-11-27 DIAGNOSIS — Z955 Presence of coronary angioplasty implant and graft: Secondary | ICD-10-CM | POA: Diagnosis not present

## 2020-11-30 ENCOUNTER — Other Ambulatory Visit: Payer: Self-pay

## 2020-11-30 ENCOUNTER — Encounter (HOSPITAL_COMMUNITY)
Admission: RE | Admit: 2020-11-30 | Discharge: 2020-11-30 | Disposition: A | Discharge status: Home / Self Care | Payer: Medicare Other | Source: Ambulatory Visit | Attending: Cardiovascular Disease | Admitting: Cardiovascular Disease

## 2020-11-30 DIAGNOSIS — Z955 Presence of coronary angioplasty implant and graft: Secondary | ICD-10-CM

## 2020-11-30 DIAGNOSIS — I214 Non-ST elevation (NSTEMI) myocardial infarction: Secondary | ICD-10-CM | POA: Diagnosis not present

## 2020-12-02 ENCOUNTER — Non-Acute Institutional Stay: Payer: Medicare Other | Admitting: Internal Medicine

## 2020-12-02 ENCOUNTER — Encounter (HOSPITAL_COMMUNITY)
Admission: RE | Admit: 2020-12-02 | Discharge: 2020-12-02 | Disposition: A | Discharge status: Home / Self Care | Payer: Medicare Other | Source: Ambulatory Visit | Attending: Cardiovascular Disease | Admitting: Cardiovascular Disease

## 2020-12-02 ENCOUNTER — Other Ambulatory Visit: Payer: Self-pay

## 2020-12-02 ENCOUNTER — Encounter: Payer: Self-pay | Admitting: Internal Medicine

## 2020-12-02 VITALS — BP 116/88 | HR 73 | Temp 96.3°F | Ht 64.0 in | Wt 153.2 lb

## 2020-12-02 DIAGNOSIS — G3184 Mild cognitive impairment, so stated: Secondary | ICD-10-CM | POA: Diagnosis not present

## 2020-12-02 DIAGNOSIS — G6289 Other specified polyneuropathies: Secondary | ICD-10-CM | POA: Diagnosis not present

## 2020-12-02 DIAGNOSIS — I214 Non-ST elevation (NSTEMI) myocardial infarction: Secondary | ICD-10-CM

## 2020-12-02 DIAGNOSIS — M810 Age-related osteoporosis without current pathological fracture: Secondary | ICD-10-CM

## 2020-12-02 DIAGNOSIS — K429 Umbilical hernia without obstruction or gangrene: Secondary | ICD-10-CM | POA: Diagnosis not present

## 2020-12-02 DIAGNOSIS — E538 Deficiency of other specified B group vitamins: Secondary | ICD-10-CM | POA: Diagnosis not present

## 2020-12-02 DIAGNOSIS — E782 Mixed hyperlipidemia: Secondary | ICD-10-CM

## 2020-12-02 DIAGNOSIS — Z955 Presence of coronary angioplasty implant and graft: Secondary | ICD-10-CM

## 2020-12-02 DIAGNOSIS — E039 Hypothyroidism, unspecified: Secondary | ICD-10-CM | POA: Diagnosis not present

## 2020-12-02 DIAGNOSIS — E611 Iron deficiency: Secondary | ICD-10-CM | POA: Diagnosis not present

## 2020-12-02 NOTE — Progress Notes (Signed)
Location:  [place] of Service:  Clinic (12)  Provider:   Code Status:  Goals of Care:  Advanced Directives 12/02/2020  Does Patient Have a Medical Advance Directive? Yes  Type of Paramedic of [place];Living will  Does patient want to make changes to medical advance directive? No - Patient declined  Copy of [place] in Chart? Yes - validated most recent copy scanned in chart (See row information)  Would patient like information on creating a medical advance directive? -  Pre-existing out of facility DNR order (yellow form or pink MOST form) -     Chief Complaint  Patient presents with   Medical Management of Chronic Issues    Patient returns to the clinic for her 4 month follow up. MMSE   Quality Metric Gaps    Hep C screen, Colonoscopy, #4 covid vaccine, flu shot    HPI: Patient is a 80 y.o. female seen today for medical management of chronic diseases.    Patient has a history of distal humerus fracture History of umbilical hernia Surgery said no issues History of osteoporosis on Fosamax Hyperlipidemia, hypothyroidism, history of peripheral neuropathy history of shortness of breath with exertion   Patient was admitted in the hospital from 6/27-6/29 for NSTEMI cardiac cath which showed subtotal occlusion of an LAD.  Underwent successful angioplasty and DES.  Does have residual 50% RCA lesion Plan per cardiology of [facility] with aspirin and Brilinta for 1 year Also was started on Crestor Her EF was 40 to 45% with hypokinesis of LV  NSTEMI Doing well with Cardiac Rehab No chest pain or SOB or LE edema No Dizziness Still have some fatigue  No Falls Lives by herself in [place] Son in [place]     Past Medical History:  Diagnosis Date   Anemia 12/2015   Bronchitis, chronic (HCC)    in her 58s   Cataract    Chronic kidney disease    in 20s had nephritis   Closed fracture of right distal humerus    right    Coronary artery disease    Hearing loss 05/03/2016   High cholesterol    Hypothyroidism    Osteopenia    Pericarditis 01/1999   Pneumothorax 01/1999   Thyroid disorder    Vitamin B 12 deficiency     Past Surgical History:  Procedure Laterality Date   birthmark      removed,left leg   CARDIAC CATHETERIZATION     CATARACT EXTRACTION  2017   CORONARY STENT INTERVENTION N/A 09/01/2020   Procedure: CORONARY STENT INTERVENTION;  Surgeon: Belva Crome, MD;  Location: [facility];  Service: Cardiovascular;  Laterality: N/A;   LEFT HEART CATH AND CORONARY ANGIOGRAPHY N/A 09/01/2020   Procedure: LEFT HEART CATH AND CORONARY ANGIOGRAPHY;  Surgeon: Belva Crome, MD;  Location: [facility];  Service: Cardiovascular;  Laterality: N/A;   ORIF HUMERUS FRACTURE Right 04/10/2019   Procedure: RIGHT OPEN REDUCTION INTERNAL FIXATION (ORIF) DISTAL HUMERUS FRACTURE WITH EXTENSION;  Surgeon: Hiram Gash, MD;  Location: [facility];  Service: Orthopedics;  Laterality: Right;   [place]?    Allergies  Allergen Reactions   Atorvastatin     Muscle weakness    Outpatient Encounter Medications as of 12/02/2020  Medication Sig   acetaminophen (TYLENOL) 650 MG CR tablet Take 1,300 mg by mouth every 8 (eight) hours as needed for pain.  alendronate (FOSAMAX) 70 MG tablet TAKE 1 TABLET BY MOUTH EVERY 7 DAYS WITH A FULL GLASS OF WATER AND ON AN EMPTY STOMACH (Patient taking differently: Take 70 mg by mouth every Saturday.)   aspirin 81 MG chewable tablet Chew 1 tablet (81 mg total) by mouth daily.   Calcium Carb-Cholecalciferol (CALCIUM 600+D) 600-800 MG-UNIT TABS Take 2 tablets by mouth daily.   cholecalciferol (VITAMIN D) 1000 UNITS tablet Take 2,000 Units by mouth daily.    ferrous sulfate 325 (65 FE) MG tablet Take 325 mg by mouth daily with breakfast.   l-methylfolate-B6-B12 (METANX) 3-35-2 MG TABS tablet Take 1 tablet by mouth daily.    levothyroxine (SYNTHROID) 125 MCG tablet TAKE 1 TABLET BY MOUTH  DAILY (Patient taking differently: Take 125 mcg by mouth daily before breakfast.)   metoprolol succinate (TOPROL-XL) 25 MG 24 hr tablet TAKE 1/2 TABLET BY MOUTH DAILY (Patient taking differently: Take 12.5 mg by mouth daily.)   nitroGLYCERIN (NITROSTAT) 0.4 MG SL tablet Place 1 tablet (0.4 mg total) under the tongue every 5 (five) minutes as needed for chest pain.   rosuvastatin (CRESTOR) 40 MG tablet Take 1 tablet (40 mg total) by mouth daily.   ticagrelor (BRILINTA) 90 MG TABS tablet Take 1 tablet (90 mg total) by mouth 2 (two) times daily.   vitamin C (ASCORBIC ACID) 500 MG tablet Take 500 mg by mouth daily.   [DISCONTINUED] calcium carbonate (OS-CAL) 1250 (500 Ca) MG chewable tablet Chew 1 tablet (1,250 mg total) by mouth daily. (Patient not taking: No sig reported)   [DISCONTINUED] furosemide (LASIX) 20 MG tablet Take 1 tablet (20 mg total) by mouth daily as needed (swelling). (Patient not taking: Reported on 11/24/2020)   No facility-administered encounter medications on file as of 12/02/2020.    Review of Systems:  Review of Systems Review of Systems  Constitutional: Negative for activity change, appetite change, chills, diaphoresis, fatigue and fever.  HENT: Negative for mouth sores, postnasal drip, rhinorrhea, sinus pain and sore throat.   Respiratory: Negative for apnea, cough, chest tightness, shortness of breath and wheezing.   Cardiovascular: Negative for chest pain, palpitations and leg swelling.  Gastrointestinal: Negative for abdominal distention, abdominal pain, constipation, diarrhea, nausea and vomiting.  Genitourinary: Negative for dysuria and frequency.  Musculoskeletal: Negative for arthralgias, joint swelling and myalgias.  Skin: Negative for rash.  Neurological: Negative for dizziness, syncope, weakness, light-headedness and numbness.  Psychiatric/Behavioral: Negative for behavioral problems, confusion and  sleep disturbance.   Health Maintenance  Topic Date Due   Hepatitis C Screening  Never done   COLONOSCOPY (Pts 45-50yrs Insurance coverage will need to be confirmed)  07/11/2020   INFLUENZA VACCINE  10/05/2020   COVID-19 Vaccine (5 - Booster for Moderna series) 11/06/2020   TETANUS/TDAP  05/30/2028   DEXA SCAN  Completed   Zoster Vaccines- Shingrix  Completed   HPV VACCINES  Aged Out    Physical Exam: Vitals:   12/02/20 1437  BP: 116/88  Pulse: 73  Temp: (!) 96.3 F (35.7 C)  SpO2: 97%  Weight: 153 lb 3.2 oz (69.5 kg)  Height: 5\' 4"  (1.626 m)   Body mass index is 26.3 kg/m. Physical Exam Constitutional: Oriented to person, place, and time. Well-developed and well-nourished.  HENT:  Head: Normocephalic.  Mouth/Throat: Oropharynx is clear and moist.  Eyes: Pupils are equal, round, and reactive to light.  Neck: Neck supple.  Breast Exam Breast Normal in size and shape. No Lumps Felt  Cardiovascular: Normal rate and normal heart  sounds.  No murmur heard. Pulmonary/Chest: Effort normal and breath sounds normal. No respiratory distress. No wheezes. She has no rales.  Abdominal: Soft. Bowel sounds are normal. No distension. There is no tenderness. There is no rebound.  Musculoskeletal: No edema.  Lymphadenopathy: none Neurological: Alert and oriented to person, place, and time.  Skin: Skin is warm and dry.  Psychiatric: Normal mood and affect. Behavior is normal. Thought content normal.   Labs reviewed: Basic Metabolic Panel: Recent Labs    01/23/20 0815 07/23/20 0815 07/23/20 0815 08/31/20 2133 09/01/20 1849 09/02/20 0249 09/08/20 0839  NA 139 140  --  136  --  138 137  K 4.3 4.3  --  4.2  --  3.2* 4.5  CL 104 105  --  103  --  108 102  CO2 29 24  --  25  --  23 23  GLUCOSE 89 82  --  133*  --  89 81  BUN 18 18  --  19  --  6* 18  CREATININE 0.58* 0.55*   < > 0.62 0.50 0.50 0.69  CALCIUM 9.2 8.9  --  9.0  --  8.2* 9.3  TSH 2.61 1.08  --   --   --   --   --     < > = values in this interval not displayed.   Liver Function Tests: Recent Labs    01/23/20 0815 07/23/20 0815 08/31/20 2133  AST 13 14 21   ALT 13 13 19   ALKPHOS  --   --  53  BILITOT 0.7 0.5 0.6  PROT 6.0* 6.2 6.7  ALBUMIN  --   --  4.0   No results for input(s): LIPASE, AMYLASE in the last 8760 hours. No results for input(s): AMMONIA in the last 8760 hours. CBC: Recent Labs    01/23/20 0815 07/23/20 0815 08/31/20 2133 09/01/20 1849 09/02/20 0249  WBC 8.3 9.1 10.2 14.2* 11.4*  NEUTROABS 4,880 5,870 7.1  --   --   HGB 13.8 13.8 13.8 12.9 12.5  HCT 40.3 40.4 42.3 37.6 37.0  MCV 96.2 97.8 99.8 97.4 97.1  PLT 299 290 280 246 217   Lipid Panel: Recent Labs    01/23/20 0815 07/23/20 0815 11/26/20 0810  CHOL 238* 267* 143  HDL 53 56 49*  LDLCALC 158* 185* 74  TRIG 141 126 116  CHOLHDL 4.5 4.8 2.9   Lab Results  Component Value Date   HGBA1C 5.7 (H) 09/01/2020    Procedures since last visit: No results found.  Assessment/Plan NSTEMI (non-ST elevated myocardial infarction) ([place]) DPAT with Aspirin and Brinlinta for 1 year On statin and Beta Blocker No Diuretics anymore Cardiac Rehab is going well  Mixed hyperlipidemia LDL less then 100  Senile osteoporosis DEXA showed T score  Right Femur -2.6 L1 is -2.4 No change since last DEXA in 2019 Patient is interested in Prolia Will let me know next visit and would then consider starting it  Hypothyroidism, unspecified type TSH normal in 5/22  Mild cognitive impairment MMSE done today shows same core not worsening Recall is good. Passed her Clock drawing Her only issue is Calculations  Vitamin B 12 deficiency On Supplement Level normal in 62/70 Umbilical hernia without obstruction and without gangrene No Surgery for now Mild Polyneuropathy On B 12 supplement Iron deficiency Hgb Normal But says Iron helps with Fatigue  Refuses Colonoscopy right now. Follow up for her Polyps she cannot be taken  off Brilinta right now Refused  Mammogram Continue Self exam. No Family History oif Breast cancer  Labs/tests ordered:  * No order type specified * Next appt:  02/03/2021

## 2020-12-03 ENCOUNTER — Encounter: Payer: Self-pay | Admitting: Internal Medicine

## 2020-12-04 ENCOUNTER — Encounter (HOSPITAL_COMMUNITY)
Admission: RE | Admit: 2020-12-04 | Discharge: 2020-12-04 | Disposition: A | Discharge status: Home / Self Care | Payer: Medicare Other | Source: Ambulatory Visit | Attending: Cardiovascular Disease | Admitting: Cardiovascular Disease

## 2020-12-04 ENCOUNTER — Other Ambulatory Visit: Payer: Self-pay

## 2020-12-04 DIAGNOSIS — I214 Non-ST elevation (NSTEMI) myocardial infarction: Secondary | ICD-10-CM | POA: Diagnosis not present

## 2020-12-04 DIAGNOSIS — Z955 Presence of coronary angioplasty implant and graft: Secondary | ICD-10-CM

## 2020-12-07 ENCOUNTER — Encounter (HOSPITAL_COMMUNITY)
Admission: RE | Admit: 2020-12-07 | Discharge: 2020-12-07 | Disposition: A | Discharge status: Home / Self Care | Payer: Medicare Other | Source: Ambulatory Visit | Attending: Cardiovascular Disease | Admitting: Cardiovascular Disease

## 2020-12-07 ENCOUNTER — Other Ambulatory Visit: Payer: Self-pay

## 2020-12-07 DIAGNOSIS — Z955 Presence of coronary angioplasty implant and graft: Secondary | ICD-10-CM | POA: Diagnosis not present

## 2020-12-07 DIAGNOSIS — I214 Non-ST elevation (NSTEMI) myocardial infarction: Secondary | ICD-10-CM | POA: Diagnosis not present

## 2020-12-09 ENCOUNTER — Encounter (HOSPITAL_COMMUNITY)
Admission: RE | Admit: 2020-12-09 | Discharge: 2020-12-09 | Disposition: A | Discharge status: Home / Self Care | Payer: Medicare Other | Source: Ambulatory Visit | Attending: Cardiovascular Disease | Admitting: Cardiovascular Disease

## 2020-12-09 ENCOUNTER — Other Ambulatory Visit: Payer: Self-pay

## 2020-12-09 DIAGNOSIS — Z955 Presence of coronary angioplasty implant and graft: Secondary | ICD-10-CM

## 2020-12-09 DIAGNOSIS — I214 Non-ST elevation (NSTEMI) myocardial infarction: Secondary | ICD-10-CM

## 2020-12-09 NOTE — Progress Notes (Signed)
Reviewed home exercise Rx with patient today. Pt plans to walk or use the NuStep in the exercise facility at [facility]. Encouraged patient to get staff there to show her how to use any equipment to avoid misuse or injury. Encouraged warm-up, cool-down and stretching. Reviewed THRR of 56-113 and keeping her RPE between 11-13. Discussed weather parameters for temperature and humidity for safe exercise outdoors. Encouraged patient to hydrate with exercise. Reviewed S/S to terminate exercise and when to call MD vs 911. Reviewed use of NTG and encouraged to carry at all times. Encouraged to carry cell phone if exercising outdoors. Pt verbalized understanding of the home exercise Rx and was provided a copy.   Lesly Rubenstein MS, ACSM-CEP, CCRP

## 2020-12-10 ENCOUNTER — Other Ambulatory Visit: Payer: Self-pay | Admitting: Cardiovascular Disease

## 2020-12-10 DIAGNOSIS — Z23 Encounter for immunization: Secondary | ICD-10-CM | POA: Diagnosis not present

## 2020-12-10 MED ORDER — ROSUVASTATIN CALCIUM 20 MG PO TABS: 20.0000 mg | ORAL_TABLET | Freq: Every day | ORAL | 1 refills | Status: DC

## 2020-12-11 ENCOUNTER — Encounter (HOSPITAL_COMMUNITY)
Admission: RE | Admit: 2020-12-11 | Discharge: 2020-12-11 | Disposition: A | Discharge status: Home / Self Care | Payer: Medicare Other | Source: Ambulatory Visit | Attending: Cardiovascular Disease | Admitting: Cardiovascular Disease

## 2020-12-11 ENCOUNTER — Other Ambulatory Visit: Payer: Self-pay

## 2020-12-11 DIAGNOSIS — Z955 Presence of coronary angioplasty implant and graft: Secondary | ICD-10-CM

## 2020-12-11 DIAGNOSIS — I214 Non-ST elevation (NSTEMI) myocardial infarction: Secondary | ICD-10-CM | POA: Diagnosis not present

## 2020-12-14 ENCOUNTER — Other Ambulatory Visit: Payer: Self-pay

## 2020-12-14 ENCOUNTER — Encounter (HOSPITAL_COMMUNITY)
Admission: RE | Admit: 2020-12-14 | Discharge: 2020-12-14 | Disposition: A | Discharge status: Home / Self Care | Payer: Medicare Other | Source: Ambulatory Visit | Attending: Cardiovascular Disease | Admitting: Cardiovascular Disease

## 2020-12-14 DIAGNOSIS — I214 Non-ST elevation (NSTEMI) myocardial infarction: Secondary | ICD-10-CM | POA: Diagnosis not present

## 2020-12-14 DIAGNOSIS — Z955 Presence of coronary angioplasty implant and graft: Secondary | ICD-10-CM

## 2020-12-15 NOTE — Progress Notes (Signed)
Cardiac Individual Treatment Plan  Patient Details  Name: Jessica [name] MRN: [id number] Date of Birth: 1940-06-06 Referring Provider:   [facility] PHASE II ORIENTATION from 11/17/2020 in [place]  Referring Provider Sherren Mocha, MD       Initial Encounter Date:  [place] PHASE II ORIENTATION from 11/17/2020 in [place]  Date 11/17/20       Visit Diagnosis: 08/31/20 NSTEMI   09/01/20 S/P DES LAD  Patient's Home Medications on Admission:  Current Outpatient Medications:    acetaminophen (TYLENOL) 650 MG CR tablet, Take 1,300 mg by mouth every 8 (eight) hours as needed for pain., Disp: , Rfl:    alendronate (FOSAMAX) 70 MG tablet, TAKE 1 TABLET BY MOUTH EVERY 7 DAYS WITH A FULL GLASS OF WATER AND ON AN EMPTY STOMACH (Patient taking differently: Take 70 mg by mouth every Saturday.), Disp: 12 tablet, Rfl: 4   aspirin 81 MG chewable tablet, Chew 1 tablet (81 mg total) by mouth daily., Disp: 90 tablet, Rfl: 2   Calcium Carb-Cholecalciferol (CALCIUM 600+D) 600-800 MG-UNIT TABS, Take 2 tablets by mouth daily., Disp: , Rfl:    cholecalciferol (VITAMIN D) 1000 UNITS tablet, Take 2,000 Units by mouth daily. , Disp: , Rfl:    ferrous sulfate 325 (65 FE) MG tablet, Take 325 mg by mouth daily with breakfast., Disp: , Rfl:    l-methylfolate-B6-B12 (METANX) 3-35-2 MG TABS tablet, Take 1 tablet by mouth daily., Disp: , Rfl:    levothyroxine (SYNTHROID) 125 MCG tablet, TAKE 1 TABLET BY MOUTH  DAILY (Patient taking differently: Take 125 mcg by mouth daily before breakfast.), Disp: 90 tablet, Rfl: 3   metoprolol succinate (TOPROL-XL) 25 MG 24 hr tablet, TAKE 1/2 TABLET BY MOUTH DAILY (Patient taking differently: Take 12.5 mg by mouth daily.), Disp: 45 tablet, Rfl: 0   nitroGLYCERIN (NITROSTAT) 0.4 MG SL tablet, Place 1 tablet (0.4 mg total) under the tongue every 5 (five) minutes as needed for  chest pain., Disp: 25 tablet, Rfl: 2   rosuvastatin (CRESTOR) 20 MG tablet, Take 1 tablet (20 mg total) by mouth daily., Disp: 30 tablet, Rfl: 1   ticagrelor (BRILINTA) 90 MG TABS tablet, Take 1 tablet (90 mg total) by mouth 2 (two) times daily., Disp: 180 tablet, Rfl: 2   vitamin C (ASCORBIC ACID) 500 MG tablet, Take 500 mg by mouth daily., Disp: , Rfl:   Past Medical History: Past Medical History:  Diagnosis Date   Anemia 12/2015   Bronchitis, chronic (HCC)    in her 33s   Cataract    Chronic kidney disease    in 20s had nephritis   Closed fracture of right distal humerus    right   Coronary artery disease    Hearing loss 05/03/2016   High cholesterol    Hypothyroidism    Osteopenia    Pericarditis 01/1999   Pneumothorax 01/1999   Thyroid disorder    Vitamin B 12 deficiency     Tobacco Use: Social History   Tobacco Use  Smoking Status Never  Smokeless Tobacco Never    Labs: Recent Review Flowsheet Data     Labs for ITP Cardiac and Pulmonary Rehab Latest Ref Rng & Units 07/18/2019 01/23/2020 07/23/2020 09/01/2020 11/26/2020   Cholestrol <200 mg/dL 233(H) 238(H) 267(H) - 143   LDLCALC mg/dL (calc) 156(H) 158(H) 185(H) - 74   LDLDIRECT mg/dL - - - - -   HDL > OR =  50 mg/dL 58 53 56 - 49(L)   Trlycerides <150 mg/dL 89 141 126 - 116   Hemoglobin A1c 4.8 - 5.6 % - - - 5.7(H) -       Capillary Blood Glucose: No results found for: GLUCAP   Exercise Target Goals: Exercise Program Goal: Individual exercise prescription set using results from initial 6 min walk test and THRR while considering  patient's activity barriers and safety.   Exercise Prescription Goal: Initial exercise prescription builds to 30-45 minutes a day of aerobic activity, 2-3 days per week.  Home exercise guidelines will be given to patient during program as part of exercise prescription that the participant will acknowledge.  Activity Barriers & Risk Stratification:  Activity Barriers & Cardiac  Risk Stratification - 11/17/20 1055       Activity Barriers & Cardiac Risk Stratification   Activity Barriers Balance Concerns;Decreased Ventricular Function;Shortness of Breath;Deconditioning    Cardiac Risk Stratification High             6 Minute Walk:  6 Minute Walk     Row Name 11/17/20 1053         6 Minute Walk   Phase Initial     Distance 1528 feet     Walk Time 6 minutes     # of Rest Breaks 0     MPH 2.9     METS 2.72     RPE 8     Perceived Dyspnea  1     VO2 Peak 9.52     Symptoms Yes (comment)     Comments left hip pain 1/10     Resting HR 69 bpm     Resting BP 108/84     Resting Oxygen Saturation  97 %     Exercise Oxygen Saturation  during 6 min walk 94 %     Max Ex. HR 91 bpm     Max Ex. BP 130/81     2 Minute Post BP 131/84              Oxygen Initial Assessment:   Oxygen Re-Evaluation:   Oxygen Discharge (Final Oxygen Re-Evaluation):   Initial Exercise Prescription:  Initial Exercise Prescription - 11/17/20 1000       Date of Initial Exercise RX and Referring Provider   Date 11/17/20    Referring Provider Sherren Mocha, MD    Expected Discharge Date 01/15/21      Recumbant Bike   Level 1.5    RPM 60    Minutes 15    METs 2      NuStep   Level 2    SPM 75    Minutes 15    METs 2      Prescription Details   Frequency (times per week) 3    Duration Progress to 30 minutes of continuous aerobic without signs/symptoms of physical distress      Intensity   THRR 40-80% of Max Heartrate 56-113    Ratings of Perceived Exertion 11-13    Perceived Dyspnea 0-4      Progression   Progression Continue progressive overload as per policy without signs/symptoms or physical distress.      Resistance Training   Training Prescription Yes    Weight 2 lbs    Reps 10-15             Perform Capillary Blood Glucose checks as needed.  Exercise Prescription Changes:   Exercise Prescription Changes     Row Name  11/23/20  1000 12/07/20 1000 12/09/20 1000         Response to Exercise   Blood Pressure (Admit) 112/80 102/63 114/80     Blood Pressure (Exercise) 128/72 118/70 126/69     Blood Pressure (Exit) 119/81 98/70 104/78     Heart Rate (Admit) 63 bpm 90 bpm 70 bpm     Heart Rate (Exercise) 78 bpm 84 bpm 80 bpm     Heart Rate (Exit) 65 bpm 63 bpm 67 bpm     Rating of Perceived Exertion (Exercise) 12 12 12      Symptoms None None None     Comments Pt's first day in the [facility] program Reviewed METs Reviewed home exercise Rx     Duration Progress to 30 minutes of  aerobic without signs/symptoms of physical distress Continue with 30 min of aerobic exercise without signs/symptoms of physical distress. Continue with 30 min of aerobic exercise without signs/symptoms of physical distress.     Intensity THRR unchanged THRR unchanged THRR unchanged           Progression   Progression Continue to progress workloads to maintain intensity without signs/symptoms of physical distress. Continue to progress workloads to maintain intensity without signs/symptoms of physical distress. Continue to progress workloads to maintain intensity without signs/symptoms of physical distress.     Average METs 1.9 2 2.2           Resistance Training   Training Prescription Yes Yes No     Weight 2 lbs 2 lbs No weights on Wednesdays     Reps 10-15 10-15 --     Time 10 Minutes 10 Minutes --           Interval Training   Interval Training No No No           Recumbant Bike   Level 1.5 1.5 1.5     RPM 60 60 60     Minutes 15 15 15      METs 1.8 1.8 2           NuStep   Level 2 2 2      SPM 75 80 90     Minutes 15 15 15      METs 2 2.2 2.3           Home Exercise Plan   Plans to continue exercise at -- -- Home (comment)     Frequency -- -- Add 2 additional days to program exercise sessions.     Initial Home Exercises Provided -- -- 12/09/20              Exercise Comments:   Exercise Comments     Row Name 11/23/20  1040 12/07/20 1000 12/09/20 1021       Exercise Comments Pt's first day in the [facility] program. Pt completed initial session with no complaints. Reviewed METs. Pt is making porgress in the [facility] program and voices she is feeling stronger. Will continue to progress as tolerated. Reviewed home exercise Rx. Pt verbalized understanding of the home exercise Rx and was provided a copy.              Exercise Goals and Review:   Exercise Goals     Row Name 11/17/20 1055             Exercise Goals   Increase Physical Activity Yes       Intervention Provide advice, education, support and counseling about physical activity/exercise needs.;Develop an individualized exercise prescription for  aerobic and resistive training based on initial evaluation findings, risk stratification, comorbidities and participant's personal goals.       Expected Outcomes Short Term: Attend rehab on a regular basis to increase amount of physical activity.;Long Term: Add in home exercise to make exercise part of routine and to increase amount of physical activity.;Long Term: Exercising regularly at least 3-5 days a week.       Increase Strength and Stamina Yes       Intervention Provide advice, education, support and counseling about physical activity/exercise needs.;Develop an individualized exercise prescription for aerobic and resistive training based on initial evaluation findings, risk stratification, comorbidities and participant's personal goals.       Expected Outcomes Short Term: Increase workloads from initial exercise prescription for resistance, speed, and METs.;Short Term: Perform resistance training exercises routinely during rehab and add in resistance training at home;Long Term: Improve cardiorespiratory fitness, muscular endurance and strength as measured by increased METs and functional capacity (6MWT)       Able to understand and use rate of perceived exertion (RPE) scale Yes       Intervention Provide  education and explanation on how to use RPE scale       Expected Outcomes Short Term: Able to use RPE daily in rehab to express subjective intensity level;Long Term:  Able to use RPE to guide intensity level when exercising independently       Knowledge and understanding of Target Heart Rate Range (THRR) Yes       Intervention Provide education and explanation of THRR including how the numbers were predicted and where they are located for reference       Expected Outcomes Short Term: Able to state/look up THRR;Short Term: Able to use daily as guideline for intensity in rehab;Long Term: Able to use THRR to govern intensity when exercising independently       Understanding of Exercise Prescription Yes       Intervention Provide education, explanation, and written materials on patient's individual exercise prescription       Expected Outcomes Short Term: Able to explain program exercise prescription;Long Term: Able to explain home exercise prescription to exercise independently                Exercise Goals Re-Evaluation :  Exercise Goals Re-Evaluation     [employer] Name 11/23/20 1039 12/07/20 1000 12/09/20 1019         Exercise Goal Re-Evaluation   Exercise Goals Review Increase Physical Activity;Increase Strength and Stamina;Able to understand and use rate of perceived exertion (RPE) scale;Knowledge and understanding of Target Heart Rate Range (THRR);Understanding of Exercise Prescription -- Increase Physical Activity;Increase Strength and Stamina;Able to understand and use rate of perceived exertion (RPE) scale;Knowledge and understanding of Target Heart Rate Range (THRR);Understanding of Exercise Prescription;Able to check pulse independently     Comments Pt's first day in the [facility] program. Pt understnads the exercise Rx, THR, and RPE scale. -- Reviewed Home exercise Rx with patient. Pt plans to walk or use the nustep in the gym where she lives. Encouraged 1-2 days/week for 30 minutes in addtion  to 3x/week in the [address] program.     Expected Outcomes Will continue to monitor patient and progress exercise workloads as tolerated. -- Pt will begin to exercise at home.              Discharge Exercise Prescription (Final Exercise Prescription Changes):  Exercise Prescription Changes - 12/09/20 1000       Response to Exercise  Blood Pressure (Admit) 114/80    Blood Pressure (Exercise) 126/69    Blood Pressure (Exit) 104/78    Heart Rate (Admit) 70 bpm    Heart Rate (Exercise) 80 bpm    Heart Rate (Exit) 67 bpm    Rating of Perceived Exertion (Exercise) 12    Symptoms None    Comments Reviewed home exercise Rx    Duration Continue with 30 min of aerobic exercise without signs/symptoms of physical distress.    Intensity THRR unchanged      Progression   Progression Continue to progress workloads to maintain intensity without signs/symptoms of physical distress.    Average METs 2.2      Resistance Training   Training Prescription No    Weight No weights on Wednesdays      Interval Training   Interval Training No      Recumbant Bike   Level 1.5    RPM 60    Minutes 15    METs 2      NuStep   Level 2    SPM 90    Minutes 15    METs 2.3      Home Exercise Plan   Plans to continue exercise at Home (comment)    Frequency Add 2 additional days to program exercise sessions.    Initial Home Exercises Provided 12/09/20             Nutrition:  Target Goals: Understanding of nutrition guidelines, daily intake of sodium 1500mg , cholesterol 200mg , calories 30% from fat and 7% or less from saturated fats, daily to have 5 or more servings of fruits and vegetables.  Biometrics:  Pre Biometrics - 11/17/20 1047       Pre Biometrics   Waist Circumference 40 inches    Hip Circumference 41.25 inches    Waist to Hip Ratio 0.97 %    Triceps Skinfold 26 mm    % Body Fat 40.6 %    Grip Strength 20 kg    Flexibility 11 in    Single Leg Stand 5.66 seconds               Nutrition Therapy Plan and Nutrition Goals:   Nutrition Assessments:  MEDIFICTS Score Key: ?70 Need to make dietary changes  40-70 Heart Healthy Diet ? 40 Therapeutic Level Cholesterol Diet   Flowsheet Row CARDIAC REHAB PHASE II EXERCISE from 11/23/2020 in [place]  Picture Your Plate Total Score on Admission 83      Picture Your Plate Scores: <63 Unhealthy dietary pattern with much room for improvement. 41-50 Dietary pattern unlikely to meet recommendations for good health and room for improvement. 51-60 More healthful dietary pattern, with some room for improvement.  >60 Healthy dietary pattern, although there may be some specific behaviors that could be improved.    Nutrition Goals Re-Evaluation:   Nutrition Goals Re-Evaluation:   Nutrition Goals Discharge (Final Nutrition Goals Re-Evaluation):   Psychosocial: Target Goals: Acknowledge presence or absence of significant depression and/or stress, maximize coping skills, provide positive support system. Participant is able to verbalize types and ability to use techniques and skills needed for reducing stress and depression.  Initial Review & Psychosocial Screening:  Initial Psych Review & Screening - 11/17/20 0910       Initial Review   Current issues with None Identified      Family Dynamics   Good Support System? Yes   Shamyia lives in a retirement community and has has  friends locally for support. Jeniffer's son lives in [place]     Barriers   Psychosocial barriers to participate in program There are no identifiable barriers or psychosocial needs.      Screening Interventions   Interventions Encouraged to exercise             Quality of Life Scores:  Quality of Life - 11/17/20 1050       Quality of Life   Select Quality of Life      Quality of Life Scores   Health/Function Pre 20.79 %    Socioeconomic Pre 26.21 %    Psych/Spiritual Pre 20.07 %    Family Pre  22 %    GLOBAL Pre 21.97 %            Scores of 19 and below usually indicate a poorer quality of life in these areas.  A difference of  2-3 points is a clinically meaningful difference.  A difference of 2-3 points in the total score of the Quality of Life Index has been associated with significant improvement in overall quality of life, self-image, physical symptoms, and general health in studies assessing change in quality of life.  PHQ-9: Recent Review Flowsheet Data     Depression screen [facility] 2/9 11/17/2020 09/17/2019 11/20/2017 03/29/2017 11/23/2015   Decreased Interest 0 0 0 0 0   Down, Depressed, Hopeless 0 0 0 0 0   PHQ - 2 Score 0 0 0 0 0   Altered sleeping - 1 - - -   Tired, decreased energy - 2 - - -   Change in appetite - 3 - - -   Feeling bad or failure about yourself  - 0 - - -   Trouble concentrating - 0 - - -   Moving slowly or fidgety/restless - 0 - - -   Suicidal thoughts - 0 - - -   PHQ-9 Score - 6 - - -      Interpretation of Total Score  Total Score Depression Severity:  1-4 = Minimal depression, 5-9 = Mild depression, 10-14 = Moderate depression, 15-19 = Moderately severe depression, 20-27 = Severe depression   Psychosocial Evaluation and Intervention:   Psychosocial Re-Evaluation:  Psychosocial Re-Evaluation     [place] Name 11/23/20 1447 12/09/20 1227           Psychosocial Re-Evaluation   Current issues with None Identified None Identified      Interventions Encouraged to attend Cardiac Rehabilitation for the exercise Encouraged to attend Cardiac Rehabilitation for the exercise      Continue Psychosocial Services  No Follow up required No Follow up required               Psychosocial Discharge (Final Psychosocial Re-Evaluation):  Psychosocial Re-Evaluation - 12/09/20 1227       Psychosocial Re-Evaluation   Current issues with None Identified    Interventions Encouraged to attend Cardiac Rehabilitation for the exercise    Continue  Psychosocial Services  No Follow up required             Vocational Rehabilitation: Provide vocational rehab assistance to qualifying candidates.   Vocational Rehab Evaluation & Intervention:  Vocational Rehab - 11/17/20 0912       Initial Vocational Rehab Evaluation & Intervention   Assessment shows need for Vocational Rehabilitation No   Tamaka is retired and does not need vocatinal rehab at this time            Education: Education Goals:  Education classes will be provided on a weekly basis, covering required topics. Participant will state understanding/return demonstration of topics presented.  Learning Barriers/Preferences:  Learning Barriers/Preferences - 11/17/20 1050       Learning Barriers/Preferences   Learning Barriers Sight;Hearing   wears glasses   Learning Preferences Computer/Internet;Written Material             Education Topics: Count Your Pulse:  -Group instruction provided by verbal instruction, demonstration, patient participation and written materials to support subject.  Instructors address importance of being able to find your pulse and how to count your pulse when at home without a heart monitor.  Patients get hands on experience counting their pulse with staff help and individually.   Heart Attack, Angina, and Risk Factor Modification:  -Group instruction provided by verbal instruction, video, and written materials to support subject.  Instructors address signs and symptoms of angina and heart attacks.    Also discuss risk factors for heart disease and how to make changes to improve heart health risk factors.   Functional Fitness:  -Group instruction provided by verbal instruction, demonstration, patient participation, and written materials to support subject.  Instructors address safety measures for doing things around the house.  Discuss how to get up and down off the floor, how to pick things up properly, how to safely get out of a chair  without assistance, and balance training.   Meditation and Mindfulness:  -Group instruction provided by verbal instruction, patient participation, and written materials to support subject.  Instructor addresses importance of mindfulness and meditation practice to help reduce stress and improve awareness.  Instructor also leads participants through a meditation exercise.    Stretching for Flexibility and Mobility:  -Group instruction provided by verbal instruction, patient participation, and written materials to support subject.  Instructors lead participants through series of stretches that are designed to increase flexibility thus improving mobility.  These stretches are additional exercise for major muscle groups that are typically performed during regular warm up and cool down.   Hands Only CPR:  -Group verbal, video, and participation provides a basic overview of AHA guidelines for community CPR. Role-play of emergencies allow participants the opportunity to practice calling for help and chest compression technique with discussion of AED use.   Hypertension: -Group verbal and written instruction that provides a basic overview of hypertension including the most recent diagnostic guidelines, risk factor reduction with self-care instructions and medication management.    Nutrition I class: Heart Healthy Eating:  -Group instruction provided by PowerPoint slides, verbal discussion, and written materials to support subject matter. The instructor gives an explanation and review of the Therapeutic Lifestyle Changes diet recommendations, which includes a discussion on lipid goals, dietary fat, sodium, fiber, plant stanol/sterol esters, sugar, and the components of a well-balanced, healthy diet.   Nutrition II class: Lifestyle Skills:  -Group instruction provided by PowerPoint slides, verbal discussion, and written materials to support subject matter. The instructor gives an explanation and review  of label reading, grocery shopping for heart health, heart healthy recipe modifications, and ways to make healthier choices when eating out.   Diabetes Question & Answer:  -Group instruction provided by PowerPoint slides, verbal discussion, and written materials to support subject matter. The instructor gives an explanation and review of diabetes co-morbidities, pre- and post-prandial blood glucose goals, pre-exercise blood glucose goals, signs, symptoms, and treatment of hypoglycemia and hyperglycemia, and foot care basics.   Diabetes Blitz:  -Group instruction provided by [employer], verbal discussion, and  written materials to support subject matter. The instructor gives an explanation and review of the physiology behind type 1 and type 2 diabetes, diabetes medications and rational behind using different medications, pre- and post-prandial blood glucose recommendations and Hemoglobin A1c goals, diabetes diet, and exercise including blood glucose guidelines for exercising safely.    Portion Distortion:  -Group instruction provided by PowerPoint slides, verbal discussion, written materials, and food models to support subject matter. The instructor gives an explanation of serving size versus portion size, changes in portions sizes over the last 20 years, and what consists of a serving from each food group.   Stress Management:  -Group instruction provided by verbal instruction, video, and written materials to support subject matter.  Instructors review role of stress in heart disease and how to cope with stress positively.     Exercising on Your Own:  -Group instruction provided by verbal instruction, power point, and written materials to support subject.  Instructors discuss benefits of exercise, components of exercise, frequency and intensity of exercise, and end points for exercise.  Also discuss use of nitroglycerin and activating EMS.  Review options of places to exercise outside of  rehab.  Review guidelines for sex with heart disease.   Cardiac Drugs I:  -Group instruction provided by verbal instruction and written materials to support subject.  Instructor reviews cardiac drug classes: antiplatelets, anticoagulants, beta blockers, and statins.  Instructor discusses reasons, side effects, and lifestyle considerations for each drug class.   Cardiac Drugs II:  -Group instruction provided by verbal instruction and written materials to support subject.  Instructor reviews cardiac drug classes: angiotensin converting enzyme inhibitors (ACE-I), angiotensin II receptor blockers (ARBs), nitrates, and calcium channel blockers.  Instructor discusses reasons, side effects, and lifestyle considerations for each drug class.   Anatomy and Physiology of the Circulatory System:  Group verbal and written instruction and models provide basic cardiac anatomy and physiology, with the coronary electrical and arterial systems. Review of: AMI, Angina, Valve disease, Heart Failure, Peripheral Artery Disease, Cardiac Arrhythmia, Pacemakers, and the ICD.   Other Education:  -Group or individual verbal, written, or video instructions that support the educational goals of the cardiac rehab program.   Holiday Eating Survival Tips:  -Group instruction provided by PowerPoint slides, verbal discussion, and written materials to support subject matter. The instructor gives patients tips, tricks, and techniques to help them not only survive but enjoy the holidays despite the onslaught of food that accompanies the holidays.   Knowledge Questionnaire Score:  Knowledge Questionnaire Score - 11/17/20 1050       Knowledge Questionnaire Score   Pre Score 21/24             Core Components/Risk Factors/Patient Goals at Admission:  Personal Goals and Risk Factors at Admission - 11/17/20 1051       Core Components/Risk Factors/Patient Goals on Admission    Weight Management Yes;Weight  Maintenance;Weight Loss    Intervention Weight Management: Develop a combined nutrition and exercise program designed to reach desired caloric intake, while maintaining appropriate intake of nutrient and fiber, sodium and fats, and appropriate energy expenditure required for the weight goal.;Weight Management: Provide education and appropriate resources to help participant work on and attain dietary goals.;Weight Management/Obesity: Establish reasonable short term and long term weight goals.    Admit Weight 153 lb 3.5 oz (69.5 kg)    Expected Outcomes Short Term: Continue to assess and modify interventions until short term weight is achieved;Long Term: Adherence to nutrition and physical  activity/exercise program aimed toward attainment of established weight goal;Weight Maintenance: Understanding of the daily nutrition guidelines, which includes 25-35% calories from fat, 7% or less cal from saturated fats, less than 200mg  cholesterol, less than 1.5gm of sodium, & 5 or more servings of fruits and vegetables daily;Weight Loss: Understanding of general recommendations for a balanced deficit meal plan, which promotes 1-2 lb weight loss per week and includes a negative energy balance of [phone] kcal/d;Understanding recommendations for meals to include 15-35% energy as protein, 25-35% energy from fat, 35-60% energy from carbohydrates, less than 200mg  of dietary cholesterol, 20-35 gm of total fiber daily;Understanding of distribution of calorie intake throughout the day with the consumption of 4-5 meals/snacks    Lipids Yes    Intervention Provide education and support for participant on nutrition & aerobic/resistive exercise along with prescribed medications to achieve LDL 70mg , HDL >40mg .    Expected Outcomes Short Term: Participant states understanding of desired cholesterol values and is compliant with medications prescribed. Participant is following exercise prescription and nutrition guidelines.;Long Term:  Cholesterol controlled with medications as prescribed, with individualized exercise RX and with personalized nutrition plan. Value goals: LDL < 70mg , HDL > 40 mg.             Core Components/Risk Factors/Patient Goals Review:   Goals and Risk Factor Review     Row Name 11/23/20 1447 12/09/20 1227           Core Components/Risk Factors/Patient Goals Review   Personal Goals Review Weight Management/Obesity;Lipids Weight Management/Obesity;Lipids      Review Ravina started cardiac rehab on 11/23/20 and did well with exercise, vital signs were stable Shaniah has been doing well with exercise at cardiac rehab., VSS      Expected Outcomes Maguadalupe will continue to participate in phase 2 cardiac rehab for exercise, nutrition and lifestyle modifications Shelsie will continue to participate in phase 2 cardiac rehab for exercise, nutrition and lifestyle modifications               Core Components/Risk Factors/Patient Goals at Discharge (Final Review):   Goals and Risk Factor Review - 12/09/20 1227       Core Components/Risk Factors/Patient Goals Review   Personal Goals Review Weight Management/Obesity;Lipids    Review Vasti has been doing well with exercise at cardiac rehab., VSS    Expected Outcomes Sherle will continue to participate in phase 2 cardiac rehab for exercise, nutrition and lifestyle modifications             ITP Comments:  ITP Comments     Row Name 11/17/20 0910 11/23/20 1401 12/09/20 1226       ITP Comments Dr Fransico Him MD, Medical Director 30 Day ITP Review. Bridney started CR on 11/23/20 and did well with exercise 30 Day ITP Review. Jaydy has good attendance and participation in phase 2 cardiac rehab.              Comments: See ITP Comments

## 2020-12-16 ENCOUNTER — Encounter (HOSPITAL_COMMUNITY)
Admission: RE | Admit: 2020-12-16 | Discharge: 2020-12-16 | Disposition: A | Discharge status: Home / Self Care | Payer: Medicare Other | Source: Ambulatory Visit | Attending: Cardiovascular Disease | Admitting: Cardiovascular Disease

## 2020-12-16 ENCOUNTER — Other Ambulatory Visit: Payer: Self-pay

## 2020-12-16 DIAGNOSIS — Z955 Presence of coronary angioplasty implant and graft: Secondary | ICD-10-CM

## 2020-12-16 DIAGNOSIS — I214 Non-ST elevation (NSTEMI) myocardial infarction: Secondary | ICD-10-CM

## 2020-12-18 ENCOUNTER — Other Ambulatory Visit: Payer: Self-pay

## 2020-12-18 ENCOUNTER — Encounter (HOSPITAL_COMMUNITY)
Admission: RE | Admit: 2020-12-18 | Discharge: 2020-12-18 | Disposition: A | Discharge status: Home / Self Care | Payer: Medicare Other | Source: Ambulatory Visit | Attending: Cardiovascular Disease | Admitting: Cardiovascular Disease

## 2020-12-18 DIAGNOSIS — Z955 Presence of coronary angioplasty implant and graft: Secondary | ICD-10-CM | POA: Diagnosis not present

## 2020-12-18 DIAGNOSIS — I214 Non-ST elevation (NSTEMI) myocardial infarction: Secondary | ICD-10-CM | POA: Diagnosis not present

## 2020-12-21 ENCOUNTER — Other Ambulatory Visit: Payer: Self-pay

## 2020-12-21 ENCOUNTER — Encounter (HOSPITAL_COMMUNITY)
Admission: RE | Admit: 2020-12-21 | Discharge: 2020-12-21 | Disposition: A | Discharge status: Home / Self Care | Payer: Medicare Other | Source: Ambulatory Visit | Attending: Cardiovascular Disease | Admitting: Cardiovascular Disease

## 2020-12-21 DIAGNOSIS — Z955 Presence of coronary angioplasty implant and graft: Secondary | ICD-10-CM | POA: Diagnosis not present

## 2020-12-21 DIAGNOSIS — I214 Non-ST elevation (NSTEMI) myocardial infarction: Secondary | ICD-10-CM | POA: Diagnosis not present

## 2020-12-23 ENCOUNTER — Encounter (HOSPITAL_COMMUNITY)
Admission: RE | Admit: 2020-12-23 | Discharge: 2020-12-23 | Disposition: A | Discharge status: Home / Self Care | Payer: Medicare Other | Source: Ambulatory Visit | Attending: Cardiovascular Disease | Admitting: Cardiovascular Disease

## 2020-12-23 ENCOUNTER — Other Ambulatory Visit: Payer: Self-pay

## 2020-12-23 DIAGNOSIS — Z955 Presence of coronary angioplasty implant and graft: Secondary | ICD-10-CM

## 2020-12-23 DIAGNOSIS — I214 Non-ST elevation (NSTEMI) myocardial infarction: Secondary | ICD-10-CM

## 2020-12-24 NOTE — Telephone Encounter (Signed)
Patient notified of Lpa results

## 2020-12-25 ENCOUNTER — Other Ambulatory Visit: Payer: Self-pay

## 2020-12-25 ENCOUNTER — Other Ambulatory Visit: Payer: Self-pay | Admitting: Cardiology

## 2020-12-25 ENCOUNTER — Encounter (HOSPITAL_COMMUNITY)
Admission: RE | Admit: 2020-12-25 | Discharge: 2020-12-25 | Disposition: A | Discharge status: Home / Self Care | Payer: Medicare Other | Source: Ambulatory Visit | Attending: Cardiovascular Disease | Admitting: Cardiovascular Disease

## 2020-12-25 DIAGNOSIS — Z955 Presence of coronary angioplasty implant and graft: Secondary | ICD-10-CM

## 2020-12-25 DIAGNOSIS — I214 Non-ST elevation (NSTEMI) myocardial infarction: Secondary | ICD-10-CM

## 2020-12-28 ENCOUNTER — Other Ambulatory Visit: Payer: Self-pay

## 2020-12-28 ENCOUNTER — Encounter (HOSPITAL_COMMUNITY)
Admission: RE | Admit: 2020-12-28 | Discharge: 2020-12-28 | Disposition: A | Discharge status: Home / Self Care | Payer: Medicare Other | Source: Ambulatory Visit | Attending: Cardiovascular Disease | Admitting: Cardiovascular Disease

## 2020-12-28 DIAGNOSIS — Z955 Presence of coronary angioplasty implant and graft: Secondary | ICD-10-CM | POA: Diagnosis not present

## 2020-12-28 DIAGNOSIS — I214 Non-ST elevation (NSTEMI) myocardial infarction: Secondary | ICD-10-CM | POA: Diagnosis not present

## 2020-12-30 ENCOUNTER — Other Ambulatory Visit: Payer: Self-pay

## 2020-12-30 ENCOUNTER — Encounter (HOSPITAL_COMMUNITY)
Admission: RE | Admit: 2020-12-30 | Discharge: 2020-12-30 | Disposition: A | Discharge status: Home / Self Care | Payer: Medicare Other | Source: Ambulatory Visit | Attending: Cardiovascular Disease | Admitting: Cardiovascular Disease

## 2020-12-30 DIAGNOSIS — I214 Non-ST elevation (NSTEMI) myocardial infarction: Secondary | ICD-10-CM

## 2020-12-30 DIAGNOSIS — Z955 Presence of coronary angioplasty implant and graft: Secondary | ICD-10-CM

## 2021-01-01 ENCOUNTER — Other Ambulatory Visit: Payer: Self-pay

## 2021-01-01 ENCOUNTER — Encounter (HOSPITAL_COMMUNITY)
Admission: RE | Admit: 2021-01-01 | Discharge: 2021-01-01 | Disposition: A | Discharge status: Home / Self Care | Payer: Medicare Other | Source: Ambulatory Visit | Attending: Cardiovascular Disease | Admitting: Cardiovascular Disease

## 2021-01-01 DIAGNOSIS — Z955 Presence of coronary angioplasty implant and graft: Secondary | ICD-10-CM | POA: Diagnosis not present

## 2021-01-01 DIAGNOSIS — I214 Non-ST elevation (NSTEMI) myocardial infarction: Secondary | ICD-10-CM

## 2021-01-04 ENCOUNTER — Encounter (HOSPITAL_COMMUNITY)
Admission: RE | Admit: 2021-01-04 | Discharge: 2021-01-04 | Disposition: A | Discharge status: Home / Self Care | Payer: Medicare Other | Source: Ambulatory Visit | Attending: Cardiovascular Disease | Admitting: Cardiovascular Disease

## 2021-01-04 ENCOUNTER — Encounter: Payer: Self-pay | Admitting: Cardiovascular Disease

## 2021-01-04 ENCOUNTER — Other Ambulatory Visit: Payer: Self-pay

## 2021-01-04 ENCOUNTER — Ambulatory Visit (INDEPENDENT_AMBULATORY_CARE_PROVIDER_SITE_OTHER): Payer: Medicare Other | Admitting: Cardiovascular Disease

## 2021-01-04 VITALS — BP 116/80 | HR 58 | Ht 64.0 in | Wt 153.4 lb

## 2021-01-04 DIAGNOSIS — I214 Non-ST elevation (NSTEMI) myocardial infarction: Secondary | ICD-10-CM

## 2021-01-04 DIAGNOSIS — Z955 Presence of coronary angioplasty implant and graft: Secondary | ICD-10-CM

## 2021-01-04 DIAGNOSIS — E782 Mixed hyperlipidemia: Secondary | ICD-10-CM | POA: Diagnosis not present

## 2021-01-04 DIAGNOSIS — I255 Ischemic cardiomyopathy: Secondary | ICD-10-CM

## 2021-01-04 DIAGNOSIS — I2510 Atherosclerotic heart disease of native coronary artery without angina pectoris: Secondary | ICD-10-CM

## 2021-01-04 NOTE — Progress Notes (Signed)
Cardiology Office Note:    Date:  01/04/2021   ID:  Jessica [name], DOB 07/01/1940, MRN [id number]  PCP:  Virgie Dad, MD   [facility] Providers Cardiologist:  Sherren Mocha, MD     Referring MD: Virgie Dad, MD   Chief Complaint  Patient presents with   Shortness of Breath    History of Present Illness:    Jessica [name] is a 80 y.o. female with a hx of coronary artery disease, presenting for follow-up evaluation.  Patient has a remote history of pericarditis in 2000.  Other medical problems include hyperlipidemia and hypothyroidism.  She presented with a non-ST elevation infarction in June 2022 and was found to have subtotal occlusion of the LAD treated with a drug-eluting stent.  She initially had an LVEF in the 40 to 45% range but has improved to an LVEF of 50 to 55% on follow-up echo assessment.  The patient is here alone today.  She is doing pretty well.  She has had some generalized weakness and she was concerned about her rosuvastatin dose.  This was reduced from 40 down to 20 mg.  She is not having muscle aching at this time.  She denies chest pain or pressure.  She denies shortness of breath, heart palpitations, orthopnea, or PND.  She is compliant with her medications.  She would like to continue exercising when she completes the cardiac rehab program.  Past Medical History:  Diagnosis Date   Anemia 12/2015   Bronchitis, chronic (HCC)    in her 32s   Cataract    Chronic kidney disease    in 20s had nephritis   Closed fracture of right distal humerus    right   Coronary artery disease    Hearing loss 05/03/2016   High cholesterol    Hypothyroidism    Osteopenia    Pericarditis 01/1999   Pneumothorax 01/1999   Thyroid disorder    Vitamin B 12 deficiency     Past Surgical History:  Procedure Laterality Date   birthmark      removed,left leg   CARDIAC CATHETERIZATION     CATARACT EXTRACTION  2017   CORONARY STENT INTERVENTION N/A 09/01/2020    Procedure: CORONARY STENT INTERVENTION;  Surgeon: Belva Crome, MD;  Location: [place] [facility];  Service: Cardiovascular;  Laterality: N/A;   LEFT HEART CATH AND CORONARY ANGIOGRAPHY N/A 09/01/2020   Procedure: LEFT HEART CATH AND CORONARY ANGIOGRAPHY;  Surgeon: Belva Crome, MD;  Location: [place] [facility];  Service: Cardiovascular;  Laterality: N/A;   ORIF HUMERUS FRACTURE Right 04/10/2019   Procedure: RIGHT OPEN REDUCTION INTERNAL FIXATION (ORIF) DISTAL HUMERUS FRACTURE WITH EXTENSION;  Surgeon: Hiram Gash, MD;  Location: [place];  Service: Orthopedics;  Laterality: Right;   [place]?    Current Medications: Current Meds  Medication Sig   acetaminophen (TYLENOL) 650 MG CR tablet Take 1,300 mg by mouth every 8 (eight) hours as needed for pain.   alendronate (FOSAMAX) 70 MG tablet TAKE 1 TABLET BY MOUTH EVERY 7 DAYS WITH A FULL GLASS OF WATER AND ON AN EMPTY STOMACH (Patient taking differently: Take 70 mg by mouth every Saturday.)   aspirin 81 MG chewable tablet Chew 1 tablet (81 mg total) by mouth daily.   Calcium Carb-Cholecalciferol (CALCIUM 600+D) 600-800 MG-UNIT TABS Take 2 tablets by mouth daily.   cholecalciferol (VITAMIN D) 1000 UNITS tablet Take 2,000 Units by mouth daily.  ferrous sulfate 325 (65 FE) MG tablet Take 325 mg by mouth daily with breakfast.   l-methylfolate-B6-B12 (METANX) 3-35-2 MG TABS tablet Take 1 tablet by mouth daily.   levothyroxine (SYNTHROID) 125 MCG tablet TAKE 1 TABLET BY MOUTH  DAILY (Patient taking differently: Take 125 mcg by mouth daily before breakfast.)   metoprolol succinate (TOPROL-XL) 25 MG 24 hr tablet TAKE 1/2 TABLET BY MOUTH DAILY (Patient taking differently: Take 12.5 mg by mouth daily.)   nitroGLYCERIN (NITROSTAT) 0.4 MG SL tablet Place 1 tablet (0.4 mg total) under the tongue every 5 (five) minutes as needed for chest pain.   rosuvastatin (CRESTOR) 20 MG tablet Take 1 tablet (20 mg  total) by mouth daily.   ticagrelor (BRILINTA) 90 MG TABS tablet Take 1 tablet (90 mg total) by mouth 2 (two) times daily.   vitamin C (ASCORBIC ACID) 500 MG tablet Take 500 mg by mouth daily.   [DISCONTINUED] calcium carbonate (OS-CAL) 1250 (500 Ca) MG chewable tablet Chew 1 tablet (1,250 mg total) by mouth daily.     Allergies:   Atorvastatin   Social History   Socioeconomic History   Marital status: Divorced    Spouse name: Not on file   Number of children: 1   Years of education: college   Highest education level: Not on file  Occupational History   Occupation: retired  Tobacco Use   Smoking status: Never   Smokeless tobacco: Never  Vaping Use   Vaping Use: Never used  Substance and Sexual Activity   Alcohol use: Yes    Alcohol/week: 3.0 standard drinks    Types: 3 Glasses of wine per week    Comment: social   Drug use: No   Sexual activity: Not on file  Other Topics Concern   Not on file  Social History Narrative   Moved to [facility] 01/14/2015   Do you drink/eat things with caffeine?  yes   Marital status?      divorced                               Do you live in a house, apartment, assisted living, condo, trailer, etc.?  Retirement community   Is it one or more stories? Yes, 3   How many persons live in your home?  1   Do you have any pets in your home? (please list) 0   Current or past profession:  Librarian   Do you exercise?     yes                                 Type & how often? Walk-varies   Do you have a living will? yes   Do you have a DNR form?         no                         If not, do you want to discuss one? yes   Do you have signed POA/HPOA for forms? yes   Social Determinants of Health   Financial Resource Strain: Not on file  Food Insecurity: Not on file  Transportation Needs: Not on file  Physical Activity: Not on file  Stress: Not on file  Social Connections: Not on file     Family History: The patient's family history  includes Arthritis  in her paternal grandmother; Asthma in her mother; Ataxia in her father; Cancer - Cervical in her sister; Diabetes in her maternal grandmother, mother, and another family member; Stroke in her father; Thyroid disease in her sister. There is no history of Colon cancer.  ROS:   Please see the history of present illness.    All other systems reviewed and are negative.  EKGs/Labs/Other Studies Reviewed:    The following studies were reviewed today: Cardiac Cath 09/01/2020: Dist LAD lesion is 75% stenosed.   Subtotal occlusion of the mid LAD with distal TIMI grade II flow.  Stenosis is a [place] 010 bifurcation stenosis with the first diagonal. Successful angioplasty and stenting using a 22 x 2.0 mm Onyx postdilated to 2.25 mm in diameter, avoiding jailing the larger first diagonal.  0% stenosis with TIMI grade III flow was noted. Tandem 50 and 40% proximal and mid RCA stenoses.  Right coronary is codominant. Left main is widely patent.   Circumflex is widely patent.  The circumflex and obtuse marginals are tortuous. Apical severe hypokinesis/akinesis.  LVEDP 6 mmHg.     RECOMMENDATIONS:   Aspirin and Brilinta for 12 months. Aggressive risk factor modification High intensity statin therapy  Echo 10/07/2020: 1. Distal septal hypokinesis with abnormal motion EF improved since echo  done 09/01/20. Left ventricular ejection fraction, by estimation, is 50 to  55%. The left ventricle has low normal function. The left ventricle has no  regional wall motion  abnormalities. Left ventricular diastolic parameters were normal.   2. Right ventricular systolic function is normal. The right ventricular  size is normal. There is normal pulmonary artery systolic pressure.   3. The mitral valve is normal in structure. Trivial mitral valve  regurgitation. No evidence of mitral stenosis.   4. The aortic valve is normal in structure. Aortic valve regurgitation is  not visualized. No aortic  stenosis is present.   5. The inferior vena cava is normal in size with greater than 50%  respiratory variability, suggesting right atrial pressure of 3 mmHg.   EKG:  EKG is not ordered today.    Recent Labs: 07/23/2020: TSH 1.08 08/31/2020: ALT 19 09/02/2020: Hemoglobin 12.5; Platelets 217 09/08/2020: BUN 18; Creatinine, Ser 0.69; Potassium 4.5; Sodium 137  Recent Lipid Panel    Component Value Date/Time   CHOL 143 11/26/2020 0810   TRIG 116 11/26/2020 0810   HDL 49 (L) 11/26/2020 0810   CHOLHDL 2.9 11/26/2020 0810   VLDL 18 02/24/2014 0926   LDLCALC 74 11/26/2020 0810   LDLDIRECT 135.4 12/26/2006 0847     Risk Assessment/Calculations:           Physical Exam:    VS:  BP 116/80   Pulse (!) 58   Ht 5\' 4"  (1.626 m)   Wt 153 lb 6.4 oz (69.6 kg)   SpO2 95%   BMI 26.33 kg/m     Wt Readings from Last 3 Encounters:  01/04/21 153 lb 6.4 oz (69.6 kg)  12/02/20 153 lb 3.2 oz (69.5 kg)  11/24/20 152 lb 12.8 oz (69.3 kg)     GEN:  Well nourished, well developed pleasant elderly woman in no acute distress HEENT: Normal NECK: No JVD; No carotid bruits LYMPHATICS: No lymphadenopathy CARDIAC: RRR, no murmurs, rubs, gallops RESPIRATORY:  Clear to auscultation without rales, wheezing or rhonchi  ABDOMEN: Soft, non-tender, non-distended MUSCULOSKELETAL:  Trace bilateral ankle edema; No deformity  SKIN: Warm and dry NEUROLOGIC:  Alert and oriented x 3 PSYCHIATRIC:  Normal affect  ASSESSMENT:    1. Coronary artery disease involving native coronary artery of native heart without angina pectoris   2. Mixed hyperlipidemia    PLAN:    In order of problems listed above:  The patient is doing well after recovering from the non-ST elevation infarction in June 2022.  She has continued to work with cardiac rehab.  She is not having any exertional angina.  She is tolerating aspirin and ticagrelor for dual antiplatelet therapy.  She is now on a reduced dose of rosuvastatin because of  leg weakness but she has had some of this predating her issues this summer when she was not on a statin drug.  Overall I think she should continue on her current medical therapy without change. Lipids have improved significantly.  Total cholesterol has decreased from 267-143.  LDL cholesterol has decreased from 185-74.  We are reducing rosuvastatin to 20 mg daily.  We will follow-up lipids and LFTs in about 3 months.  Medication Adjustments/Labs and Tests Ordered: Current medicines are reviewed at length with the patient today.  Concerns regarding medicines are outlined above.  Orders Placed This Encounter  Procedures   Hepatic function panel   Lipid panel    No orders of the defined types were placed in this encounter.   Patient Instructions  Medication Instructions:  Your physician recommends that you continue on your current medications as directed. Please refer to the Current Medication list given to you today.  *If you need a refill on your cardiac medications before your next appointment, please call your pharmacy*  Lab Work: Your physician recommends that you return for lab work in: January for fasting lipid and liver panel.  If you have labs (blood work) drawn today and your tests are completely normal, you will receive your results only by: [place] (if you have MyChart) OR A paper copy in the mail If you have any lab test that is abnormal or we need to change your treatment, we will call you to review the results.  Follow-Up: At [facility], you and your health needs are our priority.  As part of our continuing mission to provide you with exceptional heart care, we have created designated Provider Care Teams.  These Care Teams include your primary Cardiologist (physician) and Advanced Practice Providers (APPs -  Physician Assistants and Nurse Practitioners) who all work together to provide you with the care you need, when you need it.  We recommend signing up for  the patient portal called "MyChart".  Sign up information is provided on this After Visit Summary.  MyChart is used to connect with patients for Virtual Visits (Telemedicine).  Patients are able to view lab/test results, encounter notes, upcoming appointments, etc.  Non-urgent messages can be sent to your provider as well.   To learn more about what you can do with MyChart, go to [email or web address].    Your next appointment:   6 month(s)  The format for your next appointment:   In Person  Provider:   You will see one of the following Advanced Practice Providers on your designated Care Team:   Ermalinda Barrios, PA-C  Then, Sherren Mocha, MD will plan to see you again in 1 year(s).    Signed, Sherren Mocha, MD  01/04/2021 3:19 PM    [place]

## 2021-01-04 NOTE — Patient Instructions (Signed)
Medication Instructions:  Your physician recommends that you continue on your current medications as directed. Please refer to the Current Medication list given to you today.  *If you need a refill on your cardiac medications before your next appointment, please call your pharmacy*  Lab Work: Your physician recommends that you return for lab work in: January for fasting lipid and liver panel.  If you have labs (blood work) drawn today and your tests are completely normal, you will receive your results only by: [place] (if you have MyChart) OR A paper copy in the mail If you have any lab test that is abnormal or we need to change your treatment, we will call you to review the results.  Follow-Up: At [facility], you and your health needs are our priority.  As part of our continuing mission to provide you with exceptional heart care, we have created designated Provider Care Teams.  These Care Teams include your primary Cardiologist (physician) and Advanced Practice Providers (APPs -  Physician Assistants and Nurse Practitioners) who all work together to provide you with the care you need, when you need it.  We recommend signing up for the patient portal called "MyChart".  Sign up information is provided on this After Visit Summary.  MyChart is used to connect with patients for Virtual Visits (Telemedicine).  Patients are able to view lab/test results, encounter notes, upcoming appointments, etc.  Non-urgent messages can be sent to your provider as well.   To learn more about what you can do with MyChart, go to [email or web address].    Your next appointment:   6 month(s)  The format for your next appointment:   In Person  Provider:   You will see one of the following Advanced Practice Providers on your designated Care Team:   Ermalinda Barrios, PA-C  Then, Sherren Mocha, MD will plan to see you again in 1 year(s).

## 2021-01-06 ENCOUNTER — Encounter (HOSPITAL_COMMUNITY)
Admission: RE | Admit: 2021-01-06 | Discharge: 2021-01-06 | Disposition: A | Discharge status: Home / Self Care | Payer: Medicare Other | Source: Ambulatory Visit | Attending: Cardiovascular Disease | Admitting: Cardiovascular Disease

## 2021-01-06 ENCOUNTER — Other Ambulatory Visit: Payer: Self-pay

## 2021-01-06 DIAGNOSIS — I214 Non-ST elevation (NSTEMI) myocardial infarction: Secondary | ICD-10-CM | POA: Insufficient documentation

## 2021-01-06 DIAGNOSIS — Z955 Presence of coronary angioplasty implant and graft: Secondary | ICD-10-CM | POA: Insufficient documentation

## 2021-01-06 NOTE — Progress Notes (Signed)
Cardiac Individual Treatment Plan  Patient Details  Name: Jessica [name] MRN: [id number] Date of Birth: Oct 16, 1940 Referring Provider:   [facility] CARDIAC REHAB PHASE II ORIENTATION from 11/17/2020 in [place]  Referring Provider Sherren Mocha, MD       Initial Encounter Date:  [place] PHASE II ORIENTATION from 11/17/2020 in [facility]  Date 11/17/20       Visit Diagnosis: 08/31/20 NSTEMI   09/01/20 S/P DES LAD  Patient's Home Medications on Admission:  Current Outpatient Medications:    acetaminophen (TYLENOL) 650 MG CR tablet, Take 1,300 mg by mouth every 8 (eight) hours as needed for pain., Disp: , Rfl:    alendronate (FOSAMAX) 70 MG tablet, TAKE 1 TABLET BY MOUTH EVERY 7 DAYS WITH A FULL GLASS OF WATER AND ON AN EMPTY STOMACH (Patient taking differently: Take 70 mg by mouth every Saturday.), Disp: 12 tablet, Rfl: 4   aspirin 81 MG chewable tablet, Chew 1 tablet (81 mg total) by mouth daily., Disp: 90 tablet, Rfl: 2   Calcium Carb-Cholecalciferol (CALCIUM 600+D) 600-800 MG-UNIT TABS, Take 2 tablets by mouth daily., Disp: , Rfl:    cholecalciferol (VITAMIN D) 1000 UNITS tablet, Take 2,000 Units by mouth daily. , Disp: , Rfl:    ferrous sulfate 325 (65 FE) MG tablet, Take 325 mg by mouth daily with breakfast., Disp: , Rfl:    l-methylfolate-B6-B12 (METANX) 3-35-2 MG TABS tablet, Take 1 tablet by mouth daily., Disp: , Rfl:    levothyroxine (SYNTHROID) 125 MCG tablet, TAKE 1 TABLET BY MOUTH  DAILY (Patient taking differently: Take 125 mcg by mouth daily before breakfast.), Disp: 90 tablet, Rfl: 3   metoprolol succinate (TOPROL-XL) 25 MG 24 hr tablet, TAKE 1/2 TABLET BY MOUTH DAILY (Patient taking differently: Take 12.5 mg by mouth daily.), Disp: 45 tablet, Rfl: 0   nitroGLYCERIN (NITROSTAT) 0.4 MG SL tablet, Place 1 tablet (0.4 mg total) under the tongue every 5 (five) minutes as needed for  chest pain., Disp: 25 tablet, Rfl: 2   rosuvastatin (CRESTOR) 20 MG tablet, Take 1 tablet (20 mg total) by mouth daily., Disp: 30 tablet, Rfl: 1   ticagrelor (BRILINTA) 90 MG TABS tablet, Take 1 tablet (90 mg total) by mouth 2 (two) times daily., Disp: 180 tablet, Rfl: 2   vitamin C (ASCORBIC ACID) 500 MG tablet, Take 500 mg by mouth daily., Disp: , Rfl:   Past Medical History: Past Medical History:  Diagnosis Date   Anemia 12/2015   Bronchitis, chronic (HCC)    in her 22s   Cataract    Chronic kidney disease    in 20s had nephritis   Closed fracture of right distal humerus    right   Coronary artery disease    Hearing loss 05/03/2016   High cholesterol    Hypothyroidism    Osteopenia    Pericarditis 01/1999   Pneumothorax 01/1999   Thyroid disorder    Vitamin B 12 deficiency     Tobacco Use: Social History   Tobacco Use  Smoking Status Never  Smokeless Tobacco Never    Labs: Recent Review Flowsheet Data     Labs for ITP Cardiac and Pulmonary Rehab Latest Ref Rng & Units 07/18/2019 01/23/2020 07/23/2020 09/01/2020 11/26/2020   Cholestrol <200 mg/dL 233(H) 238(H) 267(H) - 143   LDLCALC mg/dL (calc) 156(H) 158(H) 185(H) - 74   LDLDIRECT mg/dL - - - - -   HDL > OR =  50 mg/dL 58 53 56 - 49(L)   Trlycerides <150 mg/dL 89 141 126 - 116   Hemoglobin A1c 4.8 - 5.6 % - - - 5.7(H) -       Capillary Blood Glucose: No results found for: GLUCAP   Exercise Target Goals: Exercise Program Goal: Individual exercise prescription set using results from initial 6 min walk test and THRR while considering  patient's activity barriers and safety.   Exercise Prescription Goal: Starting with aerobic activity 30 plus minutes a day, 3 days per week for initial exercise prescription. Provide home exercise prescription and guidelines that participant acknowledges understanding prior to discharge.  Activity Barriers & Risk Stratification:  Activity Barriers & Cardiac Risk Stratification -  11/17/20 1055       Activity Barriers & Cardiac Risk Stratification   Activity Barriers Balance Concerns;Decreased Ventricular Function;Shortness of Breath;Deconditioning    Cardiac Risk Stratification High             6 Minute Walk:  6 Minute Walk     Row Name 11/17/20 1053         6 Minute Walk   Phase Initial     Distance 1528 feet     Walk Time 6 minutes     # of Rest Breaks 0     MPH 2.9     METS 2.72     RPE 8     Perceived Dyspnea  1     VO2 Peak 9.52     Symptoms Yes (comment)     Comments left hip pain 1/10     Resting HR 69 bpm     Resting BP 108/84     Resting Oxygen Saturation  97 %     Exercise Oxygen Saturation  during 6 min walk 94 %     Max Ex. HR 91 bpm     Max Ex. BP 130/81     2 Minute Post BP 131/84              Oxygen Initial Assessment:   Oxygen Re-Evaluation:   Oxygen Discharge (Final Oxygen Re-Evaluation):   Initial Exercise Prescription:  Initial Exercise Prescription - 11/17/20 1000       Date of Initial Exercise RX and Referring Provider   Date 11/17/20    Referring Provider Sherren Mocha, MD    Expected Discharge Date 01/15/21      Recumbant Bike   Level 1.5    RPM 60    Minutes 15    METs 2      NuStep   Level 2    SPM 75    Minutes 15    METs 2      Prescription Details   Frequency (times per week) 3    Duration Progress to 30 minutes of continuous aerobic without signs/symptoms of physical distress      Intensity   THRR 40-80% of Max Heartrate 56-113    Ratings of Perceived Exertion 11-13    Perceived Dyspnea 0-4      Progression   Progression Continue progressive overload as per policy without signs/symptoms or physical distress.      Resistance Training   Training Prescription Yes    Weight 2 lbs    Reps 10-15             Perform Capillary Blood Glucose checks as needed.  Exercise Prescription Changes:   Exercise Prescription Changes     Row Name 11/23/20 1000 12/07/20 1000  12/09/20 1000  12/23/20 1100 01/04/21 1600     Response to Exercise   Blood Pressure (Admit) 112/80 102/63 114/80 102/60 114/82   Blood Pressure (Exercise) 128/72 118/70 126/69 124/74 132/70   Blood Pressure (Exit) 119/81 98/70 104/78 108/66 122/78   Heart Rate (Admit) 63 bpm 90 bpm 70 bpm 65 bpm 63 bpm   Heart Rate (Exercise) 78 bpm 84 bpm 80 bpm 82 bpm 84 bpm   Heart Rate (Exit) 65 bpm 63 bpm 67 bpm 64 bpm 63 bpm   Rating of Perceived Exertion (Exercise) 12 12 12 13 13    Symptoms None None None None None   Comments Pt's first day in the CRP2 program Reviewed METs Reviewed home exercise Rx Reviewed METs and Goals Reviewed METs   Duration Progress to 30 minutes of  aerobic without signs/symptoms of physical distress Continue with 30 min of aerobic exercise without signs/symptoms of physical distress. Continue with 30 min of aerobic exercise without signs/symptoms of physical distress. Continue with 30 min of aerobic exercise without signs/symptoms of physical distress. Continue with 30 min of aerobic exercise without signs/symptoms of physical distress.   Intensity THRR unchanged THRR unchanged THRR unchanged THRR unchanged THRR unchanged     Progression   Progression Continue to progress workloads to maintain intensity without signs/symptoms of physical distress. Continue to progress workloads to maintain intensity without signs/symptoms of physical distress. Continue to progress workloads to maintain intensity without signs/symptoms of physical distress. Continue to progress workloads to maintain intensity without signs/symptoms of physical distress. Continue to progress workloads to maintain intensity without signs/symptoms of physical distress.   Average METs 1.9 2 2.2 2.2 2.65     Resistance Training   Training Prescription Yes Yes No No Yes   Weight 2 lbs 2 lbs No weights on Wednesdays No weights on Wednesdays 3 lbs   Reps 10-15 10-15 -- -- 10-15   Time 10 Minutes 10 Minutes -- -- 10  Minutes     Interval Training   Interval Training No No No No No     Recumbant Bike   Level 1.5 1.5 1.5 1.5 1.6   RPM 60 60 60 60 60   Watts -- -- -- -- 1   Minutes 15 15 15 15 15    METs 1.8 1.8 2 2.1 2.1     NuStep   Level 2 2 2 2 3    SPM 75 80 90 90 100   Minutes 15 15 15 15 15    METs 2 2.2 2.3 2.3 3.2     Home Exercise Plan   Plans to continue exercise at -- -- Home (comment) Home (comment) Home (comment)   Frequency -- -- Add 2 additional days to program exercise sessions. Add 2 additional days to program exercise sessions. Add 2 additional days to program exercise sessions.   Initial Home Exercises Provided -- -- 12/09/20 12/09/20 12/09/20            Exercise Comments:   Exercise Comments     Row Name 11/23/20 1040 12/07/20 1000 12/09/20 1021 12/23/20 1216 01/04/21 1000   Exercise Comments Pt's first day in the [facility] program. Pt completed initial session with no complaints. Reviewed METs. Pt is making porgress in the CRP2 program and voices she is feeling stronger. Will continue to progress as tolerated. Reviewed home exercise Rx. Pt verbalized understanding of the home exercise Rx and was provided a copy. Reviewed METs and Goals: Pt is walking at home on her off days but feels she lacks  still lacks in stamina. She recently had her statin drug decreased and sees some improvement in her legs. Will continue to monitor how she feels with this decreased drug dose.  Pt demostrates good attendance to the program and puts forth good effort with RPE's of 11-13. Continue to encourage patient in progressing workloads as she is able. Pt plans to increase workload on Nustep next session. Reviewed METs. Pt is making good progress on nustep. Attempted to increase level on bike today, pt able to tolerate level 1.6. Pt is walking at home and also attending an aerobics class in the community where she lives. Pt is engaged and motivated to improve her exercise capacity.             Exercise Goals and Review:   Exercise Goals     Row Name 11/17/20 1055             Exercise Goals   Increase Physical Activity Yes       Intervention Provide advice, education, support and counseling about physical activity/exercise needs.;Develop an individualized exercise prescription for aerobic and resistive training based on initial evaluation findings, risk stratification, comorbidities and participant's personal goals.       Expected Outcomes Short Term: Attend rehab on a regular basis to increase amount of physical activity.;Long Term: Add in home exercise to make exercise part of routine and to increase amount of physical activity.;Long Term: Exercising regularly at least 3-5 days a week.       Increase Strength and Stamina Yes       Intervention Provide advice, education, support and counseling about physical activity/exercise needs.;Develop an individualized exercise prescription for aerobic and resistive training based on initial evaluation findings, risk stratification, comorbidities and participant's personal goals.       Expected Outcomes Short Term: Increase workloads from initial exercise prescription for resistance, speed, and METs.;Short Term: Perform resistance training exercises routinely during rehab and add in resistance training at home;Long Term: Improve cardiorespiratory fitness, muscular endurance and strength as measured by increased METs and functional capacity (6MWT)       Able to understand and use rate of perceived exertion (RPE) scale Yes       Intervention Provide education and explanation on how to use RPE scale       Expected Outcomes Short Term: Able to use RPE daily in rehab to express subjective intensity level;Long Term:  Able to use RPE to guide intensity level when exercising independently       Knowledge and understanding of Target Heart Rate Range (THRR) Yes       Intervention Provide education and explanation of THRR including how the numbers  were predicted and where they are located for reference       Expected Outcomes Short Term: Able to state/look up THRR;Short Term: Able to use daily as guideline for intensity in rehab;Long Term: Able to use THRR to govern intensity when exercising independently       Understanding of Exercise Prescription Yes       Intervention Provide education, explanation, and written materials on patient's individual exercise prescription       Expected Outcomes Short Term: Able to explain program exercise prescription;Long Term: Able to explain home exercise prescription to exercise independently                Exercise Goals Re-Evaluation :  Exercise Goals Re-Evaluation     Row Name 11/23/20 1039 12/07/20 1000 12/09/20 1019 12/23/20 1212  Exercise Goal Re-Evaluation   Exercise Goals Review Increase Physical Activity;Increase Strength and Stamina;Able to understand and use rate of perceived exertion (RPE) scale;Knowledge and understanding of Target Heart Rate Range (THRR);Understanding of Exercise Prescription -- Increase Physical Activity;Increase Strength and Stamina;Able to understand and use rate of perceived exertion (RPE) scale;Knowledge and understanding of Target Heart Rate Range (THRR);Understanding of Exercise Prescription;Able to check pulse independently Increase Physical Activity;Increase Strength and Stamina;Able to understand and use rate of perceived exertion (RPE) scale;Knowledge and understanding of Target Heart Rate Range (THRR);Able to check pulse independently;Understanding of Exercise Prescription    Comments Pt's first day in the CRP2 program. Pt understnads the exercise Rx, THR, and RPE scale. -- Reviewed Home exercise Rx with patient. Pt plans to walk or use the nustep in the gym where she lives. Encouraged 1-2 days/week for 30 minutes in addtion to 3x/week in the [place] program. Reviewed METs and Goals. Pt progressing in the CRP2 program. Pt has not lost any significant amount  of weight which was a goal. On the goal of increased strength an stamina, pt voices shs has increased strength but feel she is still lacking in stamina.    Expected Outcomes Will continue to monitor patient and progress exercise workloads as tolerated. -- Pt will begin to exercise at home. Will contine to monitor and progress as tolerated.              Discharge Exercise Prescription (Final Exercise Prescription Changes):  Exercise Prescription Changes - 01/04/21 1600       Response to Exercise   Blood Pressure (Admit) 114/82    Blood Pressure (Exercise) 132/70    Blood Pressure (Exit) 122/78    Heart Rate (Admit) 63 bpm    Heart Rate (Exercise) 84 bpm    Heart Rate (Exit) 63 bpm    Rating of Perceived Exertion (Exercise) 13    Symptoms None    Comments Reviewed METs    Duration Continue with 30 min of aerobic exercise without signs/symptoms of physical distress.    Intensity THRR unchanged      Progression   Progression Continue to progress workloads to maintain intensity without signs/symptoms of physical distress.    Average METs 2.65      Resistance Training   Training Prescription Yes    Weight 3 lbs    Reps 10-15    Time 10 Minutes      Interval Training   Interval Training No      Recumbant Bike   Level 1.6    RPM 60    Watts 1    Minutes 15    METs 2.1      NuStep   Level 3    SPM 100    Minutes 15    METs 3.2      Home Exercise Plan   Plans to continue exercise at Home (comment)    Frequency Add 2 additional days to program exercise sessions.    Initial Home Exercises Provided 12/09/20             Nutrition:  Target Goals: Understanding of nutrition guidelines, daily intake of sodium 1500mg , cholesterol 200mg , calories 30% from fat and 7% or less from saturated fats, daily to have 5 or more servings of fruits and vegetables.  Biometrics:  Pre Biometrics - 11/17/20 1047       Pre Biometrics   Waist Circumference 40 inches    Hip  Circumference 41.25 inches    Waist to Hip Ratio  0.97 %    Triceps Skinfold 26 mm    % Body Fat 40.6 %    Grip Strength 20 kg    Flexibility 11 in    Single Leg Stand 5.66 seconds              Nutrition Therapy Plan and Nutrition Goals:   Nutrition Assessments:  MEDIFICTS Score Key: ?70 Need to make dietary changes  40-70 Heart Healthy Diet ? 40 Therapeutic Level Cholesterol Diet  Flowsheet Row CARDIAC REHAB PHASE II EXERCISE from 11/23/2020 in [facility]  Picture Your Plate Total Score on Admission 83      Picture Your Plate Scores: <48 Unhealthy dietary pattern with much room for improvement. 41-50 Dietary pattern unlikely to meet recommendations for good health and room for improvement. 51-60 More healthful dietary pattern, with some room for improvement.  >60 Healthy dietary pattern, although there may be some specific behaviors that could be improved.    Nutrition Goals Re-Evaluation:   Nutrition Goals Discharge (Final Nutrition Goals Re-Evaluation):   Psychosocial: Target Goals: Acknowledge presence or absence of significant depression and/or stress, maximize coping skills, provide positive support system. Participant is able to verbalize types and ability to use techniques and skills needed for reducing stress and depression.  Initial Review & Psychosocial Screening:  Initial Psych Review & Screening - 11/17/20 0910       Initial Review   Current issues with None Identified      Family Dynamics   Good Support System? Yes   Jaylie lives in a retirement community and has has friends locally for support. Amen's son lives in [place]     Barriers   Psychosocial barriers to participate in program There are no identifiable barriers or psychosocial needs.      Screening Interventions   Interventions Encouraged to exercise             Quality of Life Scores:  Quality of Life - 11/17/20 1050       Quality of Life    Select Quality of Life      Quality of Life Scores   Health/Function Pre 20.79 %    Socioeconomic Pre 26.21 %    Psych/Spiritual Pre 20.07 %    Family Pre 22 %    GLOBAL Pre 21.97 %            Scores of 19 and below usually indicate a poorer quality of life in these areas.  A difference of  2-3 points is a clinically meaningful difference.  A difference of 2-3 points in the total score of the Quality of Life Index has been associated with significant improvement in overall quality of life, self-image, physical symptoms, and general health in studies assessing change in quality of life.  PHQ-9: Recent Review Flowsheet Data     Depression screen [facility] 2/9 11/17/2020 09/17/2019 11/20/2017 03/29/2017 11/23/2015   Decreased Interest 0 0 0 0 0   Down, Depressed, Hopeless 0 0 0 0 0   PHQ - 2 Score 0 0 0 0 0   Altered sleeping - 1 - - -   Tired, decreased energy - 2 - - -   Change in appetite - 3 - - -   Feeling bad or failure about yourself  - 0 - - -   Trouble concentrating - 0 - - -   Moving slowly or fidgety/restless - 0 - - -   Suicidal thoughts - 0 - - -  PHQ-9 Score - 6 - - -      Interpretation of Total Score  Total Score Depression Severity:  1-4 = Minimal depression, 5-9 = Mild depression, 10-14 = Moderate depression, 15-19 = Moderately severe depression, 20-27 = Severe depression   Psychosocial Evaluation and Intervention:   Psychosocial Re-Evaluation:  Psychosocial Re-Evaluation     [place] Name 11/23/20 1447 12/09/20 1227 01/06/21 1629         Psychosocial Re-Evaluation   Current issues with None Identified None Identified None Identified     Interventions Encouraged to attend Cardiac Rehabilitation for the exercise Encouraged to attend Cardiac Rehabilitation for the exercise Encouraged to attend Cardiac Rehabilitation for the exercise     Continue Psychosocial Services  No Follow up required No Follow up required No Follow up required              Psychosocial  Discharge (Final Psychosocial Re-Evaluation):  Psychosocial Re-Evaluation - 01/06/21 1629       Psychosocial Re-Evaluation   Current issues with None Identified    Interventions Encouraged to attend Cardiac Rehabilitation for the exercise    Continue Psychosocial Services  No Follow up required             Vocational Rehabilitation: Provide vocational rehab assistance to qualifying candidates.   Vocational Rehab Evaluation & Intervention:  Vocational Rehab - 11/17/20 0912       Initial Vocational Rehab Evaluation & Intervention   Assessment shows need for Vocational Rehabilitation No   Tyashia is retired and does not need vocatinal rehab at this time            Education: Education Goals: Education classes will be provided on a weekly basis, covering required topics. Participant will state understanding/return demonstration of topics presented.  Learning Barriers/Preferences:  Learning Barriers/Preferences - 11/17/20 1050       Learning Barriers/Preferences   Learning Barriers Sight;Hearing   wears glasses   Learning Preferences Computer/Internet;Written Material             Education Topics: Hypertension, Hypertension Reduction -Define heart disease and high blood pressure. Discus how high blood pressure affects the body and ways to reduce high blood pressure.   Exercise and Your Heart -Discuss why it is important to exercise, the FITT principles of exercise, normal and abnormal responses to exercise, and how to exercise safely.   Angina -Discuss definition of angina, causes of angina, treatment of angina, and how to decrease risk of having angina.   Cardiac Medications -Review what the following cardiac medications are used for, how they affect the body, and side effects that may occur when taking the medications.  Medications include Aspirin, Beta blockers, calcium channel blockers, ACE Inhibitors, angiotensin receptor blockers, diuretics, digoxin, and  antihyperlipidemics.   Congestive Heart Failure -Discuss the definition of CHF, how to live with CHF, the signs and symptoms of CHF, and how keep track of weight and sodium intake.   Heart Disease and Intimacy -Discus the effect sexual activity has on the heart, how changes occur during intimacy as we age, and safety during sexual activity.   Smoking Cessation / COPD -Discuss different methods to quit smoking, the health benefits of quitting smoking, and the definition of COPD.   Nutrition I: Fats -Discuss the types of cholesterol, what cholesterol does to the heart, and how cholesterol levels can be controlled.   Nutrition II: Labels -Discuss the different components of food labels and how to read food label   Heart Parts/Heart Disease and  PAD -Discuss the anatomy of the heart, the pathway of blood circulation through the heart, and these are affected by heart disease.   Stress I: Signs and Symptoms -Discuss the causes of stress, how stress may lead to anxiety and depression, and ways to limit stress.   Stress II: Relaxation -Discuss different types of relaxation techniques to limit stress.   Warning Signs of Stroke / TIA -Discuss definition of a stroke, what the signs and symptoms are of a stroke, and how to identify when someone is having stroke.   Knowledge Questionnaire Score:  Knowledge Questionnaire Score - 11/17/20 1050       Knowledge Questionnaire Score   Pre Score 21/24             Core Components/Risk Factors/Patient Goals at Admission:  Personal Goals and Risk Factors at Admission - 11/17/20 1051       Core Components/Risk Factors/Patient Goals on Admission    Weight Management Yes;Weight Maintenance;Weight Loss    Intervention Weight Management: Develop a combined nutrition and exercise program designed to reach desired caloric intake, while maintaining appropriate intake of nutrient and fiber, sodium and fats, and appropriate energy expenditure  required for the weight goal.;Weight Management: Provide education and appropriate resources to help participant work on and attain dietary goals.;Weight Management/Obesity: Establish reasonable short term and long term weight goals.    Admit Weight 153 lb 3.5 oz (69.5 kg)    Expected Outcomes Short Term: Continue to assess and modify interventions until short term weight is achieved;Long Term: Adherence to nutrition and physical activity/exercise program aimed toward attainment of established weight goal;Weight Maintenance: Understanding of the daily nutrition guidelines, which includes 25-35% calories from fat, 7% or less cal from saturated fats, less than 200mg  cholesterol, less than 1.5gm of sodium, & 5 or more servings of fruits and vegetables daily;Weight Loss: Understanding of general recommendations for a balanced deficit meal plan, which promotes 1-2 lb weight loss per week and includes a negative energy balance of [id number] kcal/d;Understanding recommendations for meals to include 15-35% energy as protein, 25-35% energy from fat, 35-60% energy from carbohydrates, less than 200mg  of dietary cholesterol, 20-35 gm of total fiber daily;Understanding of distribution of calorie intake throughout the day with the consumption of 4-5 meals/snacks    Lipids Yes    Intervention Provide education and support for participant on nutrition & aerobic/resistive exercise along with prescribed medications to achieve LDL 70mg , HDL >40mg .    Expected Outcomes Short Term: Participant states understanding of desired cholesterol values and is compliant with medications prescribed. Participant is following exercise prescription and nutrition guidelines.;Long Term: Cholesterol controlled with medications as prescribed, with individualized exercise RX and with personalized nutrition plan. Value goals: LDL < 70mg , HDL > 40 mg.             Core Components/Risk Factors/Patient Goals Review:   Goals and Risk Factor Review      Row Name 11/23/20 1447 12/09/20 1227 01/06/21 1629         Core Components/Risk Factors/Patient Goals Review   Personal Goals Review Weight Management/Obesity;Lipids Weight Management/Obesity;Lipids Weight Management/Obesity;Lipids     Review Lorann started cardiac rehab on 11/23/20 and did well with exercise, vital signs were stable Fidelis has been doing well with exercise at cardiac rehab., VSS Larosa has been doing well with exercise at cardiac rehab., VSS. Elienai repoprts feeling stronger and has increased her mets. Kaithlyn will complete phase 2 cardiac rehab on 01/15/21.     Expected Outcomes Janesa will continue  to participate in phase 2 cardiac rehab for exercise, nutrition and lifestyle modifications Jeroline will continue to participate in phase 2 cardiac rehab for exercise, nutrition and lifestyle modifications Roselia will continue to participate in phase 2 cardiac rehab for exercise, nutrition and lifestyle modifications              Core Components/Risk Factors/Patient Goals at Discharge (Final Review):   Goals and Risk Factor Review - 01/06/21 1629       Core Components/Risk Factors/Patient Goals Review   Personal Goals Review Weight Management/Obesity;Lipids    Review Dayannara has been doing well with exercise at cardiac rehab., VSS. Chelsae repoprts feeling stronger and has increased her mets. Cashlynn will complete phase 2 cardiac rehab on 01/15/21.    Expected Outcomes Emilly will continue to participate in phase 2 cardiac rehab for exercise, nutrition and lifestyle modifications             ITP Comments:  ITP Comments     Row Name 11/17/20 0910 11/23/20 1401 12/09/20 1226 01/06/21 1628     ITP Comments Dr Fransico Him MD, Medical Director 30 Day ITP Review. Mahum started CR on 11/23/20 and did well with exercise 30 Day ITP Review. Maeola has good attendance and participation in phase 2 cardiac rehab. 30 Day ITP Review. Ethell continues to have  good attendance and participation in  phase 2 cardiac rehab.             Comments: See ITP comments.Barnet Pall, RN,BSN 01/06/2021 4:33 PM

## 2021-01-08 ENCOUNTER — Encounter (HOSPITAL_COMMUNITY)
Admission: RE | Admit: 2021-01-08 | Discharge: 2021-01-08 | Disposition: A | Discharge status: Home / Self Care | Payer: Medicare Other | Source: Ambulatory Visit | Attending: Cardiovascular Disease | Admitting: Cardiovascular Disease

## 2021-01-08 ENCOUNTER — Other Ambulatory Visit: Payer: Self-pay

## 2021-01-08 VITALS — Ht 64.0 in | Wt 155.0 lb

## 2021-01-08 DIAGNOSIS — Z955 Presence of coronary angioplasty implant and graft: Secondary | ICD-10-CM

## 2021-01-08 DIAGNOSIS — I214 Non-ST elevation (NSTEMI) myocardial infarction: Secondary | ICD-10-CM

## 2021-01-11 ENCOUNTER — Encounter (HOSPITAL_COMMUNITY)
Admission: RE | Admit: 2021-01-11 | Discharge: 2021-01-11 | Disposition: A | Discharge status: Home / Self Care | Payer: Medicare Other | Source: Ambulatory Visit | Attending: Cardiovascular Disease | Admitting: Cardiovascular Disease

## 2021-01-11 ENCOUNTER — Other Ambulatory Visit: Payer: Self-pay

## 2021-01-11 DIAGNOSIS — I214 Non-ST elevation (NSTEMI) myocardial infarction: Secondary | ICD-10-CM

## 2021-01-11 DIAGNOSIS — Z955 Presence of coronary angioplasty implant and graft: Secondary | ICD-10-CM

## 2021-01-13 ENCOUNTER — Encounter (HOSPITAL_COMMUNITY)
Admission: RE | Admit: 2021-01-13 | Discharge: 2021-01-13 | Disposition: A | Discharge status: Home / Self Care | Payer: Medicare Other | Source: Ambulatory Visit | Attending: Cardiovascular Disease | Admitting: Cardiovascular Disease

## 2021-01-13 ENCOUNTER — Other Ambulatory Visit: Payer: Self-pay

## 2021-01-13 DIAGNOSIS — I214 Non-ST elevation (NSTEMI) myocardial infarction: Secondary | ICD-10-CM | POA: Diagnosis not present

## 2021-01-13 DIAGNOSIS — Z955 Presence of coronary angioplasty implant and graft: Secondary | ICD-10-CM | POA: Diagnosis not present

## 2021-01-13 NOTE — Progress Notes (Signed)
Discharge Progress Report  Patient Details  Name: Jessica [name] MRN: [id number] Date of Birth: May 27, 1940 Referring Provider:   Flowsheet Row CARDIAC REHAB PHASE II ORIENTATION from 11/17/2020 in [place]  Referring Provider Sherren Mocha, MD        Number of Visits: 24  Reason for Discharge:  Patient reached a stable level of exercise. Patient independent in their exercise. Patient has met program and personal goals.  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  ADL UCSD:   Initial Exercise Prescription:  Initial Exercise Prescription - 11/17/20 1000       Date of Initial Exercise RX and Referring Provider   Date 11/17/20    Referring Provider Sherren Mocha, MD    Expected Discharge Date 01/15/21      Recumbant Bike   Level 1.5    RPM 60    Minutes 15    METs 2      NuStep   Level 2    SPM 75    Minutes 15    METs 2      Prescription Details   Frequency (times per week) 3    Duration Progress to 30 minutes of continuous aerobic without signs/symptoms of physical distress      Intensity   THRR 40-80% of Max Heartrate 56-113    Ratings of Perceived Exertion 11-13    Perceived Dyspnea 0-4      Progression   Progression Continue progressive overload as per policy without signs/symptoms or physical distress.      Resistance Training   Training Prescription Yes    Weight 2 lbs    Reps 10-15             Discharge Exercise Prescription (Final Exercise Prescription Changes):  Exercise Prescription Changes - 01/15/21 0945       Response to Exercise   Blood Pressure (Admit) 104/70    Blood Pressure (Exercise) 136/74    Blood Pressure (Exit) 120/70    Heart Rate (Admit) 75 bpm    Heart Rate (Exercise) 96 bpm    Heart Rate (Exit) 71 bpm    Rating of Perceived Exertion (Exercise) 12    Symptoms None     Comments Pt graduated from the [place] program today    Duration Continue with 30 min of aerobic exercise without signs/symptoms of physical distress.    Intensity THRR unchanged      Progression   Progression Continue to progress workloads to maintain intensity without signs/symptoms of physical distress.    Average METs 2.7      Resistance Training   Training Prescription Yes    Weight 3 lbs    Reps 10-15    Time 10 Minutes      Interval Training   Interval Training No      Recumbant Bike   Level 1.6    RPM 60    Minutes 15    METs 2.2      NuStep   Level 3    SPM 100    Minutes 15    METs 3.2      Home Exercise Plan   Plans to continue exercise at Home (comment)    Frequency Add 2 additional days to program exercise sessions.    Initial Home Exercises Provided 12/09/20  Functional Capacity:  6 Minute Walk     Row Name 11/17/20 1053 01/11/21 0845       6 Minute Walk   Phase Initial Discharge    Distance 1528 feet 1640 feet    Distance % Change -- 7.3 %    Distance Feet Change -- 118 ft    Walk Time 6 minutes 6 minutes    # of Rest Breaks 0 0    MPH 2.9 3.11    METS 2.72 2.79    RPE 8 11    Perceived Dyspnea  1 1    VO2 Peak 9.52 9.76    Symptoms Yes (comment) No    Comments left hip pain 1/10 --    Resting HR 69 bpm 67 bpm    Resting BP 108/84 108/58    Resting Oxygen Saturation  97 % 98 %    Exercise Oxygen Saturation  during 6 min walk 94 % 98 %    Max Ex. HR 91 bpm 86 bpm    Max Ex. BP 130/81 118/60    2 Minute Post BP 131/84 --             Psychological, QOL, Others - Outcomes: PHQ 2/9: Depression screen [facility] 2/9 01/13/2021 11/17/2020 09/17/2019 11/20/2017 03/29/2017  Decreased Interest 0 0 0 0 0  Down, Depressed, Hopeless 0 0 0 0 0  PHQ - 2 Score 0 0 0 0 0  Altered sleeping - - 1 - -  Tired, decreased energy - - 2 - -  Change in appetite - - 3 - -  Feeling bad or failure about yourself  - - 0 - -  Trouble concentrating -  - 0 - -  Moving slowly or fidgety/restless - - 0 - -  Suicidal thoughts - - 0 - -  PHQ-9 Score - - 6 - -  Some recent data might be hidden    Quality of Life:  Quality of Life - 01/15/21 1212       Quality of Life Scores   Health/Function Pre 20.79 %    Health/Function Post 23.77 %    Health/Function % Change 14.33 %    Socioeconomic Pre 26.21 %    Socioeconomic Post 25.64 %    Socioeconomic % Change  -2.17 %    Psych/Spiritual Pre 20.07 %    Psych/Spiritual Post 22.75 %    Psych/Spiritual % Change 13.35 %    Family Pre 22 %    Family Post 24.13 %    Family % Change 9.68 %    GLOBAL Pre 21.97 %    GLOBAL Post 24.04 %    GLOBAL % Change 9.42 %             Personal Goals: Goals established at orientation with interventions provided to work toward goal.  Personal Goals and Risk Factors at Admission - 11/17/20 1051       Core Components/Risk Factors/Patient Goals on Admission    Weight Management Yes;Weight Maintenance;Weight Loss    Intervention Weight Management: Develop a combined nutrition and exercise program designed to reach desired caloric intake, while maintaining appropriate intake of nutrient and fiber, sodium and fats, and appropriate energy expenditure required for the weight goal.;Weight Management: Provide education and appropriate resources to help participant work on and attain dietary goals.;Weight Management/Obesity: Establish reasonable short term and long term weight goals.    Admit Weight 153 lb 3.5 oz (69.5 kg)    Expected Outcomes Short Term: Continue  to assess and modify interventions until short term weight is achieved;Long Term: Adherence to nutrition and physical activity/exercise program aimed toward attainment of established weight goal;Weight Maintenance: Understanding of the daily nutrition guidelines, which includes 25-35% calories from fat, 7% or less cal from saturated fats, less than $RemoveB'200mg'nEcNOmML$  cholesterol, less than 1.5gm of sodium, & 5 or more  servings of fruits and vegetables daily;Weight Loss: Understanding of general recommendations for a balanced deficit meal plan, which promotes 1-2 lb weight loss per week and includes a negative energy balance of [id number] kcal/d;Understanding recommendations for meals to include 15-35% energy as protein, 25-35% energy from fat, 35-60% energy from carbohydrates, less than $RemoveB'200mg'kmUXQQJB$  of dietary cholesterol, 20-35 gm of total fiber daily;Understanding of distribution of calorie intake throughout the day with the consumption of 4-5 meals/snacks    Lipids Yes    Intervention Provide education and support for participant on nutrition & aerobic/resistive exercise along with prescribed medications to achieve LDL '70mg'$ , HDL >$Remo'40mg'BVvwo$ .    Expected Outcomes Short Term: Participant states understanding of desired cholesterol values and is compliant with medications prescribed. Participant is following exercise prescription and nutrition guidelines.;Long Term: Cholesterol controlled with medications as prescribed, with individualized exercise RX and with personalized nutrition plan. Value goals: LDL < $Rem'70mg'Walj$ , HDL > 40 mg.              Personal Goals Discharge:  Goals and Risk Factor Review     Row Name 11/23/20 1447 12/09/20 1227 01/06/21 1629         Core Components/Risk Factors/Patient Goals Review   Personal Goals Review Weight Management/Obesity;Lipids Weight Management/Obesity;Lipids Weight Management/Obesity;Lipids     Review Lashaun started cardiac rehab on 11/23/20 and did well with exercise, vital signs were stable Akaysha has been doing well with exercise at cardiac rehab., VSS Imogen has been doing well with exercise at cardiac rehab., VSS. Lesette repoprts feeling stronger and has increased her mets. Rolene will complete phase 2 cardiac rehab on 01/15/21.     Expected Outcomes Alveta will continue to participate in phase 2 cardiac rehab for exercise, nutrition and lifestyle modifications Campbell will continue to  participate in phase 2 cardiac rehab for exercise, nutrition and lifestyle modifications Daielle will continue to participate in phase 2 cardiac rehab for exercise, nutrition and lifestyle modifications              Exercise Goals and Review:  Exercise Goals     Row Name 11/17/20 1055             Exercise Goals   Increase Physical Activity Yes       Intervention Provide advice, education, support and counseling about physical activity/exercise needs.;Develop an individualized exercise prescription for aerobic and resistive training based on initial evaluation findings, risk stratification, comorbidities and participant's personal goals.       Expected Outcomes Short Term: Attend rehab on a regular basis to increase amount of physical activity.;Long Term: Add in home exercise to make exercise part of routine and to increase amount of physical activity.;Long Term: Exercising regularly at least 3-5 days a week.       Increase Strength and Stamina Yes       Intervention Provide advice, education, support and counseling about physical activity/exercise needs.;Develop an individualized exercise prescription for aerobic and resistive training based on initial evaluation findings, risk stratification, comorbidities and participant's personal goals.       Expected Outcomes Short Term: Increase workloads from initial exercise prescription for resistance, speed, and METs.;Short Term: Perform  resistance training exercises routinely during rehab and add in resistance training at home;Long Term: Improve cardiorespiratory fitness, muscular endurance and strength as measured by increased METs and functional capacity (6MWT)       Able to understand and use rate of perceived exertion (RPE) scale Yes       Intervention Provide education and explanation on how to use RPE scale       Expected Outcomes Short Term: Able to use RPE daily in rehab to express subjective intensity level;Long Term:  Able to use RPE to  guide intensity level when exercising independently       Knowledge and understanding of Target Heart Rate Range (THRR) Yes       Intervention Provide education and explanation of THRR including how the numbers were predicted and where they are located for reference       Expected Outcomes Short Term: Able to state/look up THRR;Short Term: Able to use daily as guideline for intensity in rehab;Long Term: Able to use THRR to govern intensity when exercising independently       Understanding of Exercise Prescription Yes       Intervention Provide education, explanation, and written materials on patient's individual exercise prescription       Expected Outcomes Short Term: Able to explain program exercise prescription;Long Term: Able to explain home exercise prescription to exercise independently                Exercise Goals Re-Evaluation:  Exercise Goals Re-Evaluation     Row Name 11/23/20 1039 12/07/20 1000 12/09/20 1019 12/23/20 1212 01/15/21 0950     Exercise Goal Re-Evaluation   Exercise Goals Review Increase Physical Activity;Increase Strength and Stamina;Able to understand and use rate of perceived exertion (RPE) scale;Knowledge and understanding of Target Heart Rate Range (THRR);Understanding of Exercise Prescription -- Increase Physical Activity;Increase Strength and Stamina;Able to understand and use rate of perceived exertion (RPE) scale;Knowledge and understanding of Target Heart Rate Range (THRR);Understanding of Exercise Prescription;Able to check pulse independently Increase Physical Activity;Increase Strength and Stamina;Able to understand and use rate of perceived exertion (RPE) scale;Knowledge and understanding of Target Heart Rate Range (THRR);Able to check pulse independently;Understanding of Exercise Prescription Increase Physical Activity;Increase Strength and Stamina;Able to understand and use rate of perceived exertion (RPE) scale;Knowledge and understanding of Target Heart  Rate Range (THRR);Able to check pulse independently;Understanding of Exercise Prescription   Comments Pt's first day in the [facility] program. Pt understnads the exercise Rx, THR, and RPE scale. -- Reviewed Home exercise Rx with patient. Pt plans to walk or use the nustep in the gym where she lives. Encouraged 1-2 days/week for 30 minutes in addtion to 3x/week in the [place] program. Reviewed METs and Goals. Pt progressing in the [facility] program. Pt has not lost any significant amount of weight which was a goal. On the goal of increased strength an stamina, pt voices shs has increased strength but feel she is still lacking in stamina. Pt graduated form the [place] program today. Pt has been walking at home and plans to continues as well as going to a gym where she lives and will take advantage of classes offered there such as chair exercise.   Expected Outcomes Will continue to monitor patient and progress exercise workloads as tolerated. -- Pt will begin to exercise at home. Will contine to monitor and progress as tolerated. Pt will continue to exercise at home on her own.            Nutrition & Weight -  Outcomes:  Pre Biometrics - 11/17/20 1047       Pre Biometrics   Waist Circumference 40 inches    Hip Circumference 41.25 inches    Waist to Hip Ratio 0.97 %    Triceps Skinfold 26 mm    % Body Fat 40.6 %    Grip Strength 20 kg    Flexibility 11 in    Single Leg Stand 5.66 seconds             Post Biometrics - 01/08/21 1049        Post  Biometrics   Height $Remov'5\' 4"'AblSFT$  (1.626 m)    Weight 70.3 kg    Waist Circumference 40 inches    Hip Circumference 41 inches    Waist to Hip Ratio 0.98 %    BMI (Calculated) 26.59    Triceps Skinfold 26 mm    % Body Fat 40.7 %    Grip Strength 27 kg    Flexibility 11.5 in             Nutrition:   Nutrition Discharge:   Education Questionnaire Score:  Knowledge Questionnaire Score - 01/15/21 1214       Knowledge Questionnaire Score   Post Score  24/24             Goals reviewed with patient; copy given to patient.Pt graduated from cardiac rehab program on 01/14/21 with completion of  exercise sessions in Phase II. Pt maintained good attendance and progressed nicely during her participation in rehab as evidenced by increased MET level.   Medication list reconciled. Repeat  PHQ score- 0 .  Pt has made significant lifestyle changes and should be commended for her success. Pt feels she has achieved her goals during cardiac rehab. Nocole increased her distance on her post exercise walk test by 118 feet. Pt plans to continue exercise at the fitness center where she lives 3 days a week. We are proud of Eldana's progress!Barnet Pall, RN,BSN 01/26/2021 2:31 PM

## 2021-01-15 ENCOUNTER — Encounter (HOSPITAL_COMMUNITY)
Admission: RE | Admit: 2021-01-15 | Discharge: 2021-01-15 | Disposition: A | Discharge status: Home / Self Care | Payer: Medicare Other | Source: Ambulatory Visit | Attending: Cardiovascular Disease | Admitting: Cardiovascular Disease

## 2021-01-15 ENCOUNTER — Other Ambulatory Visit: Payer: Self-pay

## 2021-01-15 DIAGNOSIS — I214 Non-ST elevation (NSTEMI) myocardial infarction: Secondary | ICD-10-CM | POA: Diagnosis not present

## 2021-01-15 DIAGNOSIS — Z955 Presence of coronary angioplasty implant and graft: Secondary | ICD-10-CM | POA: Diagnosis not present

## 2021-01-18 ENCOUNTER — Telehealth (HOSPITAL_COMMUNITY): Payer: Self-pay | Admitting: Internal Medicine

## 2021-01-18 ENCOUNTER — Encounter: Payer: Self-pay | Admitting: Internal Medicine

## 2021-01-19 ENCOUNTER — Telehealth: Payer: Self-pay | Admitting: Internal Medicine

## 2021-01-19 ENCOUNTER — Other Ambulatory Visit: Payer: Self-pay | Admitting: Internal Medicine

## 2021-01-19 DIAGNOSIS — R2689 Other abnormalities of gait and mobility: Secondary | ICD-10-CM

## 2021-01-19 DIAGNOSIS — Z9181 History of falling: Secondary | ICD-10-CM

## 2021-01-19 NOTE — Telephone Encounter (Signed)
Jessica [name] called requesting referral to [facility] [address] to join balance & fall program. She will need PT referral from Dr Lyndel Safe in order to do so.  Please advise when generated & I will be glad to get this sent over.   Thanks, Kathyrn Lass Referral Coordinator  [facility] Adult [facility]

## 2021-01-19 NOTE — Telephone Encounter (Signed)
Ms Schlegel called requesting referral to [facility] [address] to join balance & fall program. She will need PT referral from Dr Lyndel Safe in order to do so.  Please advise when generated & I will be glad to get this sent over.   Thanks, Kathyrn Lass Referral Coordinator  [facility] & Adult Medicine

## 2021-02-03 ENCOUNTER — Non-Acute Institutional Stay: Payer: Medicare Other | Admitting: Internal Medicine

## 2021-02-03 ENCOUNTER — Encounter: Payer: Self-pay | Admitting: Internal Medicine

## 2021-02-03 ENCOUNTER — Other Ambulatory Visit: Payer: Self-pay

## 2021-02-03 VITALS — BP 132/71 | HR 66 | Temp 96.3°F | Ht 64.0 in | Wt 153.4 lb

## 2021-02-03 DIAGNOSIS — E782 Mixed hyperlipidemia: Secondary | ICD-10-CM

## 2021-02-03 DIAGNOSIS — R5383 Other fatigue: Secondary | ICD-10-CM

## 2021-02-03 DIAGNOSIS — E039 Hypothyroidism, unspecified: Secondary | ICD-10-CM

## 2021-02-03 DIAGNOSIS — G3184 Mild cognitive impairment, so stated: Secondary | ICD-10-CM

## 2021-02-03 DIAGNOSIS — I214 Non-ST elevation (NSTEMI) myocardial infarction: Secondary | ICD-10-CM | POA: Diagnosis not present

## 2021-02-03 DIAGNOSIS — M810 Age-related osteoporosis without current pathological fracture: Secondary | ICD-10-CM

## 2021-02-03 DIAGNOSIS — E538 Deficiency of other specified B group vitamins: Secondary | ICD-10-CM | POA: Diagnosis not present

## 2021-02-03 DIAGNOSIS — R2689 Other abnormalities of gait and mobility: Secondary | ICD-10-CM | POA: Diagnosis not present

## 2021-02-03 DIAGNOSIS — K429 Umbilical hernia without obstruction or gangrene: Secondary | ICD-10-CM

## 2021-02-03 NOTE — Progress Notes (Signed)
Location:  [place] of Service:  Clinic (12)  Provider:   Code Status:  Goals of Care:  Advanced Directives 02/03/2021  Does Patient Have a Medical Advance Directive? Yes  Type of Paramedic of [place];Living will  Does patient want to make changes to medical advance directive? No - Patient declined  Copy of [place] in Chart? Yes - validated most recent copy scanned in chart (See row information)  Would patient like information on creating a medical advance directive? -  Pre-existing out of facility DNR order (yellow form or pink MOST form) -     Chief Complaint  Patient presents with   Medical Management of Chronic Issues    Patient returns to the clinic for follow up. CC: Tired   Quality Metric Gaps    Covid booster, Hep C screen. Patient refused colonoscopy.    HPI: Patient is a 80 y.o. female seen today for medical management of chronic diseases.    Patient has a history of distal humerus fracture History of umbilical hernia Surgery said no issues History of osteoporosis on Fosamax Hyperlipidemia, hypothyroidism, history of peripheral neuropathy history of shortness of breath with exertion   Patient was admitted in the hospital from 6/27-6/29 for NSTEMI cardiac cath which showed subtotal occlusion of an LAD.  Underwent successful angioplasty and DES.  Does have residual 50% RCA lesion Plan per cardiology of [facility] with aspirin and Brilinta for 1 year Also was started on Crestor Her EF was 50-55% with No Wall motion Abnormalities now  Continues to do well. Discharged from the Cardiac Rehab But has Balancing issues and is planning to do therapy for that  Also Interested to change Fosamax to Prolia Patient also is travelling to her son in [place] and wanted to make sure it is safe to travel She is Uptodate on her Covid and Flu  No Chest pain or SOB Does c/o Feeling Fatigue    Past Medical History:   Diagnosis Date   Anemia 12/2015   Bronchitis, chronic ([place])    in her 72s   Cataract    Chronic kidney disease    in 20s had nephritis   Closed fracture of right distal humerus    right   Coronary artery disease    Hearing loss 05/03/2016   High cholesterol    Hypothyroidism    Osteopenia    Pericarditis 01/1999   Pneumothorax 01/1999   Thyroid disorder    Vitamin B 12 deficiency     Past Surgical History:  Procedure Laterality Date   birthmark      removed,left leg   CARDIAC CATHETERIZATION     CATARACT EXTRACTION  2017   CORONARY STENT INTERVENTION N/A 09/01/2020   Procedure: CORONARY STENT INTERVENTION;  Surgeon: Belva Crome, MD;  Location: [facility];  Service: Cardiovascular;  Laterality: N/A;   LEFT HEART CATH AND CORONARY ANGIOGRAPHY N/A 09/01/2020   Procedure: LEFT HEART CATH AND CORONARY ANGIOGRAPHY;  Surgeon: Belva Crome, MD;  Location: [facility];  Service: Cardiovascular;  Laterality: N/A;   ORIF HUMERUS FRACTURE Right 04/10/2019   Procedure: RIGHT OPEN REDUCTION INTERNAL FIXATION (ORIF) DISTAL HUMERUS FRACTURE WITH EXTENSION;  Surgeon: Hiram Gash, MD;  Location: [facility];  Service: Orthopedics;  Laterality: Right;   [place]?    Allergies  Allergen Reactions   Atorvastatin     Muscle weakness  Outpatient Encounter Medications as of 02/03/2021  Medication Sig   acetaminophen (TYLENOL) 650 MG CR tablet Take 1,300 mg by mouth every 8 (eight) hours as needed for pain.   alendronate (FOSAMAX) 70 MG tablet TAKE 1 TABLET BY MOUTH EVERY 7 DAYS WITH A FULL GLASS OF WATER AND ON AN EMPTY STOMACH (Patient taking differently: Take 70 mg by mouth every Saturday.)   aspirin 81 MG chewable tablet Chew 1 tablet (81 mg total) by mouth daily.   Calcium Carb-Cholecalciferol (CALCIUM 600+D) 600-800 MG-UNIT TABS Take 2 tablets by mouth daily.   cholecalciferol (VITAMIN D) 1000 UNITS tablet Take 2,000  Units by mouth daily.    ferrous sulfate 325 (65 FE) MG tablet Take 325 mg by mouth daily with breakfast.   l-methylfolate-B6-B12 (METANX) 3-35-2 MG TABS tablet Take 1 tablet by mouth daily.   levothyroxine (SYNTHROID) 125 MCG tablet TAKE 1 TABLET BY MOUTH  DAILY (Patient taking differently: Take 125 mcg by mouth daily before breakfast.)   metoprolol succinate (TOPROL-XL) 25 MG 24 hr tablet TAKE 1/2 TABLET BY MOUTH DAILY (Patient taking differently: Take 12.5 mg by mouth daily.)   nitroGLYCERIN (NITROSTAT) 0.4 MG SL tablet Place 1 tablet (0.4 mg total) under the tongue every 5 (five) minutes as needed for chest pain.   rosuvastatin (CRESTOR) 20 MG tablet Take 1 tablet (20 mg total) by mouth daily.   ticagrelor (BRILINTA) 90 MG TABS tablet Take 1 tablet (90 mg total) by mouth 2 (two) times daily.   vitamin C (ASCORBIC ACID) 500 MG tablet Take 500 mg by mouth daily.   [DISCONTINUED] calcium carbonate (OS-CAL) 1250 (500 Ca) MG chewable tablet Chew 1 tablet (1,250 mg total) by mouth daily.   No facility-administered encounter medications on file as of 02/03/2021.    Review of Systems:  Review of Systems  Constitutional:  Negative for activity change and appetite change.  HENT: Negative.    Respiratory:  Negative for cough and shortness of breath.   Cardiovascular:  Negative for leg swelling.  Gastrointestinal:  Negative for constipation.  Genitourinary: Negative.   Musculoskeletal:  Positive for gait problem. Negative for arthralgias and myalgias.  Skin: Negative.   Neurological:  Positive for weakness. Negative for dizziness.  Psychiatric/Behavioral:  Negative for confusion, dysphoric mood and sleep disturbance.    Health Maintenance  Topic Date Due   Hepatitis C Screening  Never done   COVID-19 Vaccine (4 - Booster) 01/20/2021   COLONOSCOPY (Pts 45-42yrs Insurance coverage will need to be confirmed)  02/03/2026 (Originally 07/11/2020)   TETANUS/TDAP  05/30/2028   Pneumonia Vaccine 59+  Years old  Completed   INFLUENZA VACCINE  Completed   DEXA SCAN  Completed   Zoster Vaccines- Shingrix  Completed   HPV VACCINES  Aged Out    Physical Exam: Vitals:   02/03/21 1343  BP: 132/71  Pulse: 66  Temp: (!) 96.3 F (35.7 C)  SpO2: 97%  Weight: 153 lb 6.4 oz (69.6 kg)  Height: 5\' 4"  (1.626 m)   Body mass index is 26.33 kg/m. Physical Exam Vitals reviewed.  Constitutional:      Appearance: Normal appearance.  HENT:     Head: Normocephalic.     Nose: Nose normal.     Mouth/Throat:     Mouth: Mucous membranes are moist.     Pharynx: Oropharynx is clear.  Eyes:     Pupils: Pupils are equal, round, and reactive to light.  Cardiovascular:     Rate and Rhythm: Normal rate and  regular rhythm.     Pulses: Normal pulses.     Heart sounds: Normal heart sounds. No murmur heard. Pulmonary:     Effort: Pulmonary effort is normal.     Breath sounds: Normal breath sounds.  Abdominal:     General: Abdomen is flat. Bowel sounds are normal.     Palpations: Abdomen is soft.  Musculoskeletal:        General: No swelling.     Cervical back: Neck supple.  Skin:    General: Skin is warm.  Neurological:     General: No focal deficit present.     Mental Status: She is alert and oriented to person, place, and time.  Psychiatric:        Mood and Affect: Mood normal.        Thought Content: Thought content normal.   .mmse Labs reviewed: Basic Metabolic Panel: Recent Labs    07/23/20 0815 07/23/20 0815 08/31/20 2133 09/01/20 1849 09/02/20 0249 09/08/20 0839  NA 140  --  136  --  138 137  K 4.3  --  4.2  --  3.2* 4.5  CL 105  --  103  --  108 102  CO2 24  --  25  --  23 23  GLUCOSE 82  --  133*  --  89 81  BUN 18  --  19  --  6* 18  CREATININE 0.55*   < > 0.62 0.50 0.50 0.69  CALCIUM 8.9  --  9.0  --  8.2* 9.3  TSH 1.08  --   --   --   --   --    < > = values in this interval not displayed.   Liver Function Tests: Recent Labs    07/23/20 0815 08/31/20 2133   AST 14 21  ALT 13 19  ALKPHOS  --  53  BILITOT 0.5 0.6  PROT 6.2 6.7  ALBUMIN  --  4.0   No results for input(s): LIPASE, AMYLASE in the last 8760 hours. No results for input(s): AMMONIA in the last 8760 hours. CBC: Recent Labs    07/23/20 0815 08/31/20 2133 09/01/20 1849 09/02/20 0249  WBC 9.1 10.2 14.2* 11.4*  NEUTROABS 5,870 7.1  --   --   HGB 13.8 13.8 12.9 12.5  HCT 40.4 42.3 37.6 37.0  MCV 97.8 99.8 97.4 97.1  PLT 290 280 246 217   Lipid Panel: Recent Labs    07/23/20 0815 11/26/20 0810  CHOL 267* 143  HDL 56 49*  LDLCALC 185* 74  TRIG 126 116  CHOLHDL 4.8 2.9   Lab Results  Component Value Date   HGBA1C 5.7 (H) 09/01/2020    Procedures since last visit: No results found.  Assessment/Plan 1. Balance problem Referal to Therapy  2. Other fatigue Continue to do therapy Labs are in good Limits Crestor was reduced by Cardiology for her Leg pains  3. NSTEMI (non-ST elevated myocardial infarction) ([place]) DPAT with Aspirin and Brinlinta for 1 year On statin and Beta Blocker No Diuretics anymore Just finished Cardiac Rehab  4. Mixed hyperlipidemia Crestor Half dose due to Cramps  5. Senile osteoporosis DEXA showed T score  Right Femur -2.6 L1 is -2.4 No change since last DEXA in 2019 Patient is interested in Prolia Ordered through our Office. Awaiting Insurance approval    6. Hypothyroidism, unspecified type TSH normal in 5/22  7. Mild cognitive impairment MMSE  is 25/30  Recall is good. Passed her Clock drawing  Doing well   8. Vitamin B 12 deficiency On Supplements  9. Umbilical hernia without obstruction and without gangrene No Surgery for now Mild Polyneuropathy On B 12 supplement Iron deficiency Hgb Normal But says Iron helps with Fatigue   Refuses Colonoscopy right now. Follow up for her Polyps she cannot be taken off Brilinta right now Refused Mammogram Continue Self exam. No Family History of Breast cancer      Labs/tests ordered:  * No order type specified * Next appt:  03/25/2021

## 2021-02-07 ENCOUNTER — Encounter: Payer: Self-pay | Admitting: Internal Medicine

## 2021-02-08 ENCOUNTER — Ambulatory Visit (INDEPENDENT_AMBULATORY_CARE_PROVIDER_SITE_OTHER): Payer: Medicare Other | Admitting: Adult Health

## 2021-02-08 ENCOUNTER — Encounter: Payer: Self-pay | Admitting: Adult Health

## 2021-02-08 ENCOUNTER — Other Ambulatory Visit: Payer: Self-pay

## 2021-02-08 VITALS — BP 128/78 | HR 60 | Temp 98.4°F | Ht 64.0 in | Wt 154.8 lb

## 2021-02-08 DIAGNOSIS — I255 Ischemic cardiomyopathy: Secondary | ICD-10-CM | POA: Diagnosis not present

## 2021-02-08 DIAGNOSIS — E039 Hypothyroidism, unspecified: Secondary | ICD-10-CM | POA: Diagnosis not present

## 2021-02-08 LAB — TSH: TSH: 0.15 mIU/L — ABNORMAL LOW (ref 0.40–4.50)

## 2021-02-08 LAB — T4, FREE: Free T4: 1.5 ng/dL (ref 0.8–1.8)

## 2021-02-08 NOTE — Progress Notes (Signed)
Location:  [facility]   Place of Service:   clinic    CODE STATUS:   Allergies  Allergen Reactions   Atorvastatin     Muscle weakness    Chief Complaint  Patient presents with   Acute Visit    Patient present today for thyroid check.    HPI:  She had been to her dentist and had a painful thyroid gland. She does have a history of hypothyroidism and is taking synthroid daily. She denies any excessive fatigue; no dry skin; no difficulty or painful swallowing.   Past Medical History:  Diagnosis Date   Anemia 12/2015   Bronchitis, chronic ([facility])    in her 30s   Cataract    Chronic kidney disease    in 20s had nephritis   Closed fracture of right distal humerus    right   Coronary artery disease    Hearing loss 05/03/2016   High cholesterol    Hypothyroidism    Osteopenia    Pericarditis 01/1999   Pneumothorax 01/1999   Thyroid disorder    Vitamin B 12 deficiency     Past Surgical History:  Procedure Laterality Date   birthmark      removed,left leg   CARDIAC CATHETERIZATION     CATARACT EXTRACTION  2017   CORONARY STENT INTERVENTION N/A 09/01/2020   Procedure: CORONARY STENT INTERVENTION;  Surgeon: Belva Crome, MD;  Location: [place];  Service: Cardiovascular;  Laterality: N/A;   LEFT HEART CATH AND CORONARY ANGIOGRAPHY N/A 09/01/2020   Procedure: LEFT HEART CATH AND CORONARY ANGIOGRAPHY;  Surgeon: Belva Crome, MD;  Location: [place] [facility];  Service: Cardiovascular;  Laterality: N/A;   ORIF HUMERUS FRACTURE Right 04/10/2019   Procedure: RIGHT OPEN REDUCTION INTERNAL FIXATION (ORIF) DISTAL HUMERUS FRACTURE WITH EXTENSION;  Surgeon: Hiram Gash, MD;  Location: [facility];  Service: Orthopedics;  Laterality: Right;   [place]?    Social History   Socioeconomic History   Marital status: Divorced    Spouse name: Not on file   Number of children: 1   Years of education: college    Highest education level: Not on file  Occupational History   Occupation: retired  Tobacco Use   Smoking status: Never   Smokeless tobacco: Never  Vaping Use   Vaping Use: Never used  Substance and Sexual Activity   Alcohol use: Yes    Alcohol/week: 3.0 standard drinks    Types: 3 Glasses of wine per week    Comment: social   Drug use: No   Sexual activity: Not on file  Other Topics Concern   Not on file  Social History Narrative   Moved to [facility] 01/14/2015   Do you drink/eat things with caffeine?  yes   Marital status?      divorced                               Do you live in a house, apartment, assisted living, condo, trailer, etc.?  Retirement community   Is it one or more stories? Yes, 3   How many persons live in your home?  1   Do you have any pets in your home? (please list) 0   Current or past profession:  Librarian   Do you exercise?     yes  Type & how often? Walk-varies   Do you have a living will? yes   Do you have a DNR form?         no                         If not, do you want to discuss one? yes   Do you have signed POA/HPOA for forms? yes   Social Determinants of Health   Financial Resource Strain: Not on file  Food Insecurity: Not on file  Transportation Needs: Not on file  Physical Activity: Not on file  Stress: Not on file  Social Connections: Not on file  Intimate Partner Violence: Not on file   Family History  Problem Relation Age of Onset   Diabetes Mother    Asthma Mother    Ataxia Father    Stroke Father    Cancer - Cervical Sister    Thyroid disease Sister    Diabetes Other    Diabetes Maternal Grandmother    Arthritis Paternal Grandmother    Colon cancer Neg Hx       VITAL SIGNS BP 128/78   Pulse 60   Temp 98.4 F (36.9 C)   Ht 5\' 4"  (1.626 m)   Wt 154 lb 12.8 oz (70.2 kg)   SpO2 97%   BMI 26.57 kg/m   Outpatient Encounter Medications as of 02/08/2021  Medication Sig    acetaminophen (TYLENOL) 650 MG CR tablet Take 1,300 mg by mouth every 8 (eight) hours as needed for pain.   alendronate (FOSAMAX) 70 MG tablet TAKE 1 TABLET BY MOUTH EVERY 7 DAYS WITH A FULL GLASS OF WATER AND ON AN EMPTY STOMACH (Patient taking differently: Take 70 mg by mouth every Saturday.)   aspirin 81 MG chewable tablet Chew 1 tablet (81 mg total) by mouth daily.   Calcium Carb-Cholecalciferol (CALCIUM 600+D) 600-800 MG-UNIT TABS Take 2 tablets by mouth daily.   cholecalciferol (VITAMIN D) 1000 UNITS tablet Take 2,000 Units by mouth daily.    ferrous sulfate 325 (65 FE) MG tablet Take 325 mg by mouth daily with breakfast.   l-methylfolate-B6-B12 (METANX) 3-35-2 MG TABS tablet Take 1 tablet by mouth daily.   levothyroxine (SYNTHROID) 125 MCG tablet TAKE 1 TABLET BY MOUTH  DAILY (Patient taking differently: Take 125 mcg by mouth daily before breakfast.)   metoprolol succinate (TOPROL-XL) 25 MG 24 hr tablet TAKE 1/2 TABLET BY MOUTH DAILY (Patient taking differently: Take 12.5 mg by mouth daily.)   nitroGLYCERIN (NITROSTAT) 0.4 MG SL tablet Place 1 tablet (0.4 mg total) under the tongue every 5 (five) minutes as needed for chest pain.   rosuvastatin (CRESTOR) 20 MG tablet Take 1 tablet (20 mg total) by mouth daily.   ticagrelor (BRILINTA) 90 MG TABS tablet Take 1 tablet (90 mg total) by mouth 2 (two) times daily.   vitamin C (ASCORBIC ACID) 500 MG tablet Take 500 mg by mouth daily.   [DISCONTINUED] calcium carbonate (OS-CAL) 1250 (500 Ca) MG chewable tablet Chew 1 tablet (1,250 mg total) by mouth daily.   No facility-administered encounter medications on file as of 02/08/2021.     SIGNIFICANT DIAGNOSTIC EXAMS  07-23-20: tsh 1.09    Review of Systems  Constitutional:  Negative for malaise/fatigue.  HENT:         Has tender thyroid gland   Respiratory:  Negative for cough and shortness of breath.   Cardiovascular:  Negative for chest pain, palpitations and leg  swelling.  Gastrointestinal:   Negative for abdominal pain, constipation and heartburn.  Musculoskeletal:  Negative for back pain, joint pain and myalgias.  Skin: Negative.   Neurological:  Negative for dizziness.  Psychiatric/Behavioral:  The patient is not nervous/anxious.    Physical Exam Constitutional:      General: She is not in acute distress.    Appearance: She is well-developed. She is not diaphoretic.  Neck:     Thyroid: Thyroid tenderness present. No thyromegaly.     Comments: Grainy texture; tenderness present  Cardiovascular:     Rate and Rhythm: Normal rate and regular rhythm.     Heart sounds: Normal heart sounds.  Pulmonary:     Effort: Pulmonary effort is normal. No respiratory distress.     Breath sounds: Normal breath sounds.  Abdominal:     General: Bowel sounds are normal. There is no distension.     Palpations: Abdomen is soft.     Tenderness: There is no abdominal tenderness.  Musculoskeletal:        General: Normal range of motion.     Cervical back: Neck supple.     Right lower leg: No edema.     Left lower leg: No edema.  Lymphadenopathy:     Cervical: No cervical adenopathy.  Skin:    General: Skin is warm and dry.  Neurological:     Mental Status: She is alert and oriented to person, place, and time.      ASSESSMENT/ PLAN:  TODAY  Acquired hypothyroidism: will setup a thyroid ultrasound; will get tsh and free t4; will have her follow up with Dr. Lyndel Safe in about 4 weeks.    Ok Edwards NP [facility]  Contact [phone] Monday through Friday 8am- 5pm  After hours call [phone]

## 2021-02-10 ENCOUNTER — Other Ambulatory Visit: Payer: Self-pay

## 2021-02-10 ENCOUNTER — Other Ambulatory Visit: Payer: Self-pay | Admitting: Adult Health

## 2021-02-10 MED ORDER — LEVOTHYROXINE SODIUM 112 MCG PO TABS: 112.0000 ug | ORAL_TABLET | Freq: Every day | ORAL | 0 refills | Status: DC

## 2021-02-11 ENCOUNTER — Encounter: Payer: Self-pay | Admitting: Internal Medicine

## 2021-02-11 NOTE — Telephone Encounter (Signed)
Forwarded message to Dr. Lyndel Safe and Referral Coordinator, Lattie Haw

## 2021-02-12 ENCOUNTER — Ambulatory Visit: Payer: Medicare Other | Attending: Internal Medicine

## 2021-02-12 ENCOUNTER — Other Ambulatory Visit: Payer: Self-pay

## 2021-02-12 DIAGNOSIS — Z9181 History of falling: Secondary | ICD-10-CM | POA: Insufficient documentation

## 2021-02-12 DIAGNOSIS — R2689 Other abnormalities of gait and mobility: Secondary | ICD-10-CM | POA: Diagnosis not present

## 2021-02-12 DIAGNOSIS — M6281 Muscle weakness (generalized): Secondary | ICD-10-CM | POA: Diagnosis not present

## 2021-02-12 DIAGNOSIS — R2681 Unsteadiness on feet: Secondary | ICD-10-CM | POA: Insufficient documentation

## 2021-02-12 NOTE — Therapy (Signed)
[facility] [address] [place], [place], [address] Phone: [phone]   Fax:  [phone]  Physical Therapy Evaluation  Patient Details  Name: Jessica [name] MRN: [id number] Date of Birth: 1940/12/26 Referring Provider (PT): Virgie Dad, MD   Encounter Date: 02/12/2021   PT End of Session - 02/12/21 1023     Visit Number 1    Number of Visits 7    Date for PT Re-Evaluation 04/09/21    Authorization Type Medicare A+B/AARP Supplement (10th Visit PN)    Progress Note Due on Visit 10    PT Start Time 0930    PT Stop Time 1012    PT Time Calculation (min) 42 min    Equipment Utilized During Treatment Gait belt    Activity Tolerance Patient tolerated treatment well    Behavior During Therapy [facility] for tasks assessed/performed             Past Medical History:  Diagnosis Date   Anemia 12/2015   Bronchitis, chronic ([place])    in her 88s   Cataract    Chronic kidney disease    in 20s had nephritis   Closed fracture of right distal humerus    right   Coronary artery disease    Hearing loss 05/03/2016   High cholesterol    Hypothyroidism    Osteopenia    Pericarditis 01/1999   Pneumothorax 01/1999   Thyroid disorder    Vitamin B 12 deficiency     Past Surgical History:  Procedure Laterality Date   birthmark      removed,left leg   CARDIAC CATHETERIZATION     CATARACT EXTRACTION  2017   CORONARY STENT INTERVENTION N/A 09/01/2020   Procedure: CORONARY STENT INTERVENTION;  Surgeon: Belva Crome, MD;  Location: [place] [facility];  Service: Cardiovascular;  Laterality: N/A;   LEFT HEART CATH AND CORONARY ANGIOGRAPHY N/A 09/01/2020   Procedure: LEFT HEART CATH AND CORONARY ANGIOGRAPHY;  Surgeon: Belva Crome, MD;  Location: [place] [facility];  Service: Cardiovascular;  Laterality: N/A;   ORIF HUMERUS FRACTURE Right 04/10/2019   Procedure: RIGHT OPEN REDUCTION INTERNAL FIXATION (ORIF) DISTAL  HUMERUS FRACTURE WITH EXTENSION;  Surgeon: Hiram Gash, MD;  Location: [place];  Service: Orthopedics;  Laterality: Right;   [place]?    There were no vitals filed for this visit.    Subjective Assessment - 02/12/21 0934     Subjective Patient reports that she has noticed issues with her balance and feels as if she is going to tip over. Reports she also notices she weaves as she walks. This has been going on for a while. Denies recent falls. Patient reports that she is using a trekking pole on unlevel surfaces, reports she only uses one at the time. Reports that when she has falling, she has noticed she has been trying to manage multiple things at once.    Pertinent History Anemia, Bronchitis, CKD, Hx of R Humerus Fx, CAD, Hearing Loss, Osteopenia, Vitamin B12 Deficiency    Limitations House hold activities;Walking    Patient Stated Goals Restore balance    Currently in Pain? Yes    Pain Score 6     Pain Location Back    Pain Orientation Lower    Pain Descriptors / Indicators Aching;Sore    Pain Type Acute pain    Pain Onset Today    Pain Frequency Intermittent    Aggravating  Factors  mornings    Pain Relieving Factors movement                [facility] PT Assessment - 02/12/21 0001       Assessment   Medical Diagnosis Falls/Imbalance    Referring Provider (PT) Mahlon Gammon, MD    Onset Date/Surgical Date 01/19/21   referral date   Hand Dominance Right    Prior Therapy PT for balance approx 3 years ago. Just finished cardiac rehab      Precautions   Precautions Fall      Restrictions   Weight Bearing Restrictions No      Balance Screen   Has the patient fallen in the past 6 months No    Has the patient had a decrease in activity level because of a fear of falling?  No    Is the patient reluctant to leave their home because of a fear of falling?  No      Home [employer] information [employer] manager residence     Living Arrangements Other (Comment)    Available Help at Discharge Other (Comment)   [facility]   Type of Home Independent living facility    Home Access Level entry    Home Layout One level    Home Equipment Other (comment)   trekking poles   Additional Comments reports she does approx 18 stairs at independent living facility, does use rails.      Prior Function   Level of Independence Independent    Vocation Retired    Leisure TV, Reading, Tai Chi      Cognition   Overall Cognitive Status Within Functional Limits for tasks assessed      Sensation   Light Touch Appears Intact    Additional Comments for [employer]   Gross Motor Movements are Fluid and Coordinated Yes      ROM / Strength   AROM / PROM / Strength Strength      Strength   Overall Strength Deficits    Strength Assessment Site Knee;Hip;Ankle    Right/Left Hip Right;Left    Right Hip Flexion 4+/5    Right Hip ABduction 4/5    Left Hip Flexion 4+/5    Left Hip ABduction 4/5    Right/Left Knee Right;Left    Right Knee Flexion 4+/5    Right Knee Extension 4+/5    Left Knee Flexion 4+/5    Left Knee Extension 4+/5    Right/Left Ankle Right;Left    Right Ankle Dorsiflexion 4/5    Left Ankle Dorsiflexion 4/5      Transfers   Transfers Sit to Stand;Stand to Sit    Sit to Stand 5: Supervision    Five time sit to stand comments  17.60 secs with UE support    Stand to Sit 5: Supervision      Ambulation/Gait   Ambulation/Gait Yes    Ambulation/Gait Assistance 5: Supervision    Ambulation/Gait Assistance Details supervision, no unsteadiness noted but with dynamic gait more notable veering/sway    Ambulation Distance (Feet) 115 Feet    Assistive device None    Gait Pattern Within Functional Limits    Ambulation Surface Level;Indoor    Gait velocity 7.22 secs = 1.38 m/s    Stairs Yes    Stairs Assistance 5: Supervision    Stairs Assistance Details (indicate cue type and reason)  ascend/descend with single rail x 4 stairs  Stair Management Technique One rail Right;Alternating pattern;Forwards    Number of Stairs 4    Height of Stairs 6      Standardized Balance Assessment   Standardized Balance Assessment Timed Up and Go Test      Timed Up and Go Test   TUG Normal TUG;Manual TUG;Cognitive TUG    Normal TUG (seconds) 8.19    Manual TUG (seconds) 8.33    Cognitive TUG (seconds) 10.66    TUG Comments without AD      High Level Balance   High Level Balance Comments Completed M-CTSIB: situation 1-3: full 30 seconds, situation 4: avg of 18 seconds.      Functional Gait  Assessment   Gait assessed  Yes    Gait Level Surface Walks 20 ft in less than 5.5 sec, no assistive devices, good speed, no evidence for imbalance, normal gait pattern, deviates no more than 6 in outside of the 12 in walkway width.    Change in Gait Speed Able to smoothly change walking speed without loss of balance or gait deviation. Deviate no more than 6 in outside of the 12 in walkway width.    Gait with Horizontal Head Turns Performs head turns smoothly with slight change in gait velocity (eg, minor disruption to smooth gait path), deviates 6-10 in outside 12 in walkway width, or uses an assistive device.    Gait with Vertical Head Turns Performs task with slight change in gait velocity (eg, minor disruption to smooth gait path), deviates 6 - 10 in outside 12 in walkway width or uses assistive device    Gait and Pivot Turn Pivot turns safely within 3 sec and stops quickly with no loss of balance.    Step Over Obstacle Is able to step over one shoe box (4.5 in total height) without changing gait speed. No evidence of imbalance.    Gait with Narrow Base of Support Ambulates 4-7 steps.    Gait with Eyes Closed Walks 20 ft, uses assistive device, slower speed, mild gait deviations, deviates 6-10 in outside 12 in walkway width. Ambulates 20 ft in less than 9 sec but greater than 7 sec.    Ambulating  Backwards Walks 20 ft, uses assistive device, slower speed, mild gait deviations, deviates 6-10 in outside 12 in walkway width.    Steps Alternating feet, must use rail.    Total Score 22    FGA comment: 22/30                        Objective measurements completed on examination: See above findings.                PT Education - 02/12/21 1023     Education Details Educated on [employer]) Educated Patient    Methods Explanation    Comprehension Verbalized understanding              PT Short Term Goals - 02/12/21 1026       PT SHORT TERM GOAL #1   Title Patient will be independent with initial HEP for balance (ALL STGs Due: 03/05/21)    Baseline no HEP provided    Time 3    Period Weeks    Status New    Target Date 03/05/21      PT SHORT TERM GOAL #2   Title Patient will be able to ascend/descend a curb without AD and no LOB for improved safety with  community mobility    Baseline TBA    Time 3    Period Weeks    Status New               PT Long Term Goals - 02/12/21 1028       PT LONG TERM GOAL #1   Title Patient will be independent with final HEP for balance (ALL LTGs Due: 04/02/21)    Baseline no HEP established    Time 6    Period Weeks    Status New    Target Date 04/02/21      PT LONG TERM GOAL #2   Title Patient will improve FGA to >/= 25/30 to demo improved balance and reduced fall risk    Baseline 22/30    Time 6    Period Weeks    Status New      PT LONG TERM GOAL #3   Title Patient will be able to hold situation 4 of M-CTSIB for >/= 25 seconds    Baseline 18 seconds    Time 6    Period Weeks    Status New      PT LONG TERM GOAL #4   Title Patient will be demo ability to ambulate >/= 500 ft outdoors on unelvel  surfaces with supervision and no AD    Baseline TBA    Time 6    Period Weeks    Status New      PT LONG TERM GOAL #5   Title Patient will improve 5x sit <> stand to </= 12  seconds to demo improved balance/strength    Baseline 17.6 secs without UE support    Time 6    Period Weeks    Status New                    Plan - 02/12/21 1032     Clinical Impression Statement Patient is a 80 y.o. female referred to Neuro OPPT services for falls/imbalance. Patient's PMH significant for the following: Anemia, Bronchitis, CKD, Hx of R Humerus Fx, CAD, Hearing Loss, Osteopenia, Vitamin B12 Deficiency. Patient presents with the following impairments: decreased strength, decreased balance, increased fall risk, and pain. Patient is currently ambulating at 1.38 m/s indicating community ambulator. Patient is at fall risk with 22/30 on FGA and 5x sit <> stand of 17.6 seconds. Patient also demo increased challenge with vision removed on M-CTSIB. Patient will benefit from skilled PT services to address balance impairments and improved safety with functional mobility.    Personal Factors and Comorbidities Comorbidity 3+;Time since onset of injury/illness/exacerbation    Comorbidities Anemia, Bronchitis, CKD, Hx of R Humerus Fx, CAD, Hearing Loss, Osteopenia, Vitamin B12 Deficiency    Examination-Activity Limitations Stairs;Reach Overhead;Locomotion Level    Examination-Participation Restrictions Community Activity;Interpersonal Relationship    Stability/Clinical Decision Making Stable/Uncomplicated    Clinical Decision Making Low    Rehab Potential Good    PT Frequency 1x / week    PT Duration 6 weeks   plus eval   PT Treatment/Interventions ADLs/Self Care Home Management;Aquatic Therapy;Moist Heat;Cryotherapy;DME Instruction;Gait training;Stair training;Functional mobility training;Therapeutic activities;Neuromuscular re-education;Balance training;Therapeutic exercise;Patient/family education;Vestibular;Manual techniques;Passive range of motion    PT Next Visit Plan Assess curb (indoors/outdoors) and gait on outdoor surfaces. Initiate HEP focused on dynamic gait, vision  removed on complaint surfaces.    Consulted and Agree with Plan of Care Patient             Patient will benefit from skilled therapeutic  intervention in order to improve the following deficits and impairments:  Decreased balance, Pain, Decreased strength, Decreased activity tolerance, Decreased endurance, Difficulty walking  Visit Diagnosis: Unsteadiness on feet  Muscle weakness (generalized)  Other abnormalities of gait and mobility     Problem List Patient Active Problem List   Diagnosis Date Noted   NSTEMI (non-ST elevated myocardial infarction) ([place]) 09/01/2020   Closed fracture of right distal humerus 04/10/2019   Dyspnea 05/09/2018   Vitamin B 12 deficiency 05/09/2018   Cough 03/06/2018   Senile osteoporosis 11/20/2017   Iron deficiency 84/78/4128   Lichen sclerosus 20/81/3887   External hemorrhoid 04/26/2017   Weakness of left leg 06/07/2016   Balance disorder 05/24/2016   Memory deficit 05/24/2016   Anemia 05/19/2016   Hearing loss 05/03/2016   Hypothyroidism 07/24/2013   Bradycardia on ECG 07/24/2013   Numbness 08/07/2012   HYPERLIPIDEMIA 12/29/2006    Jones Bales, PT, DPT 02/12/2021, 10:40 AM  [facility] [address] [place], [place], [address] Phone: [phone]   Fax:  [phone]  Name: Jessica [name] MRN: [id number] Date of Birth: 1941-02-24

## 2021-02-12 NOTE — Telephone Encounter (Signed)
Quay Burow  You 15 hours ago (4:53 PM)   LM Thanks, she is scheduled at [facility] 02/18/21 for 230p  Pt has been notified of appt  Vilinda Blanks

## 2021-02-18 ENCOUNTER — Other Ambulatory Visit: Payer: Self-pay

## 2021-02-18 ENCOUNTER — Ambulatory Visit (HOSPITAL_COMMUNITY)
Admission: RE | Admit: 2021-02-18 | Discharge: 2021-02-18 | Disposition: A | Discharge status: Home / Self Care | Payer: Medicare Other | Source: Ambulatory Visit | Attending: Adult Health | Admitting: Adult Health

## 2021-02-18 DIAGNOSIS — E034 Atrophy of thyroid (acquired): Secondary | ICD-10-CM | POA: Diagnosis not present

## 2021-02-18 DIAGNOSIS — E039 Hypothyroidism, unspecified: Secondary | ICD-10-CM | POA: Insufficient documentation

## 2021-02-19 ENCOUNTER — Ambulatory Visit: Payer: Medicare Other | Admitting: Physical Therapy

## 2021-02-19 DIAGNOSIS — R2681 Unsteadiness on feet: Secondary | ICD-10-CM | POA: Diagnosis not present

## 2021-02-19 DIAGNOSIS — R2689 Other abnormalities of gait and mobility: Secondary | ICD-10-CM | POA: Diagnosis not present

## 2021-02-19 DIAGNOSIS — M6281 Muscle weakness (generalized): Secondary | ICD-10-CM | POA: Diagnosis not present

## 2021-02-19 DIAGNOSIS — Z9181 History of falling: Secondary | ICD-10-CM | POA: Diagnosis not present

## 2021-02-19 NOTE — Patient Instructions (Signed)
Access Code: [email or web address] URL: [email or web address] Date: 02/19/2021 Prepared by: Misty Stanley  Exercises Supine Lower Trunk Rotation - 1 x daily - 7 x weekly - 1 sets - 10 reps Hooklying Single Knee to Chest Stretch - 1 x daily - 7 x weekly - 2 sets - 5 reps - 5 seconds hold Hooklying Hamstring Stretch with Strap - 1 x daily - 7 x weekly - 2 sets - 5 reps - 10 second hold Supine ITB Stretch with Strap - 1 x daily - 7 x weekly - 2 sets - 3 reps - 5 second hold Seated Piriformis Stretch (Mirrored) - 1 x daily - 7 x weekly - 2 sets - 30 second hold Side stretch, reaching to the RIGHT - 1 x daily - 7 x weekly - 3 sets - 10 reps Standing Single Leg Stance with Counter Support - 1 x daily - 7 x weekly - 2 sets - 3 reps

## 2021-02-19 NOTE — Therapy (Signed)
[facility] [address] [place], [place], [address] Phone: [phone]   Fax:  [phone]  Physical Therapy Treatment  Patient Details  Name: Jessica [name] MRN: [id number] Date of Birth: 1940/05/22 Referring Provider (PT): Virgie Dad, MD   Encounter Date: 02/19/2021   PT End of Session - 02/19/21 1159     Visit Number 2    Number of Visits 7    Date for PT Re-Evaluation 04/09/21    Authorization Type Medicare A+B/AARP Supplement (10th Visit PN)    Progress Note Due on Visit 10    PT Start Time 0847    PT Stop Time 0932    PT Time Calculation (min) 45 min    Activity Tolerance Patient tolerated treatment well    Behavior During Therapy [facility] for tasks assessed/performed             Past Medical History:  Diagnosis Date   Anemia 12/2015   Bronchitis, chronic ([place])    in her 18s   Cataract    Chronic kidney disease    in 20s had nephritis   Closed fracture of right distal humerus    right   Coronary artery disease    Hearing loss 05/03/2016   High cholesterol    Hypothyroidism    Osteopenia    Pericarditis 01/1999   Pneumothorax 01/1999   Thyroid disorder    Vitamin B 12 deficiency     Past Surgical History:  Procedure Laterality Date   birthmark      removed,left leg   CARDIAC CATHETERIZATION     CATARACT EXTRACTION  2017   CORONARY STENT INTERVENTION N/A 09/01/2020   Procedure: CORONARY STENT INTERVENTION;  Surgeon: Belva Crome, MD;  Location: [place];  Service: Cardiovascular;  Laterality: N/A;   LEFT HEART CATH AND CORONARY ANGIOGRAPHY N/A 09/01/2020   Procedure: LEFT HEART CATH AND CORONARY ANGIOGRAPHY;  Surgeon: Belva Crome, MD;  Location: [facility];  Service: Cardiovascular;  Laterality: N/A;   ORIF HUMERUS FRACTURE Right 04/10/2019   Procedure: RIGHT OPEN REDUCTION INTERNAL FIXATION (ORIF) DISTAL HUMERUS FRACTURE WITH EXTENSION;  Surgeon: Hiram Gash, MD;  Location: [facility];  Service: Orthopedics;  Laterality: Right;   [place]?    There were no vitals filed for this visit.   Subjective Assessment - 02/19/21 0849     Subjective Moved furniture around this morning and flipped a mattress and now she is hurting in L low back, hip and into LLE.  Feels like it gets better and then she does something to flare it back up.  Balance felt fine while moving furniture.    Pertinent History Anemia, Bronchitis, CKD, Hx of R Humerus Fx, CAD, Hearing Loss, Osteopenia, Vitamin B12 Deficiency    Limitations House hold activities;Walking    Patient Stated Goals Restore balance    Currently in Pain? Yes    Pain Score 9     Pain Location Back    Pain Orientation Left    Pain Descriptors / Indicators Sore    Pain Onset Today              [facility] Adult PT Treatment/Exercise - 02/19/21 0853       Ambulation/Gait   Ambulation/Gait Yes    Ambulation/Gait Assistance 5: Supervision    Ambulation Distance (Feet) 115 Feet    Assistive device None    Gait Pattern Antalgic  Ambulation Surface Level;Indoor    Curb 4: Min assist    Curb Details (indicate cue type and reason) pt self selected to ascend with LLE and descend with RLE; educated pt on technique of "up with the good, down with the bad" for safety.  Pt return demonstrated with min guard and reported improved confidence with this technique but continues to demonstrate imbalance during SLS.    Gait Comments decreased stance on LLE due to pain            Reviewed the following exercises with patient in supine and sitting on mat.  Pt return demonstrated each exercise.  Pt noted to be tender to touch over L piriformis and quadratus lumborum.    Also provided pt with SLS exercise to initiate balance training and hip strengthening due to instability in SLS when performing curb negotiation.  Pt return demonstrated with UE support on chair; recommended  pt use bilat UE for support at home.  Access Code: [email or web address] URL: [email or web address] Date: 02/19/2021 Prepared by: Misty Stanley  Exercises Supine Lower Trunk Rotation - 1 x daily - 7 x weekly - 1 sets - 10 reps Hooklying Single Knee to Chest Stretch - 1 x daily - 7 x weekly - 2 sets - 5 reps - 5 seconds hold Hooklying Hamstring Stretch with Strap - 1 x daily - 7 x weekly - 2 sets - 5 reps - 10 second hold Supine ITB Stretch with Strap - 1 x daily - 7 x weekly - 2 sets - 3 reps - 5 second hold Seated Piriformis Stretch (Mirrored) - 1 x daily - 7 x weekly - 2 sets - 30 second hold Side stretch, reaching to the RIGHT - 1 x daily - 7 x weekly - 3 sets - 10 reps Standing Single Leg Stance with Counter Support with head turns to L and R - 1 x daily - 7 x weekly - 2 sets - 3 reps    PT Education - 02/19/21 1156     Education Details Sequence for performing curb - ascend with RLE, descend with LLE.  Initiated HEP focusing on stretches for low back/hip and balance; will add more balance next session.  Improve body awareness and assessing body sensations when performing activities or exercises to tell the difference between warning "pain" and discomfort due to stretching or mm fatigue - pt exhibits some avoidance when experiencing discomfort.    Person(s) Educated Patient    Methods Explanation;Demonstration;Handout    Comprehension Verbalized understanding;Returned demonstration              PT Short Term Goals - 02/12/21 1026       PT SHORT TERM GOAL #1   Title Patient will be independent with initial HEP for balance (ALL STGs Due: 03/05/21)    Baseline no HEP provided    Time 3    Period Weeks    Status New    Target Date 03/05/21      PT SHORT TERM GOAL #2   Title Patient will be able to ascend/descend a curb without AD and no LOB for improved safety with community mobility    Baseline TBA    Time 3    Period Weeks    Status New               PT Long  Term Goals - 02/12/21 1028       PT LONG TERM GOAL #1   Title Patient will be independent with final  HEP for balance (ALL LTGs Due: 04/02/21)    Baseline no HEP established    Time 6    Period Weeks    Status New    Target Date 04/02/21      PT LONG TERM GOAL #2   Title Patient will improve FGA to >/= 25/30 to demo improved balance and reduced fall risk    Baseline 22/30    Time 6    Period Weeks    Status New      PT LONG TERM GOAL #3   Title Patient will be able to hold situation 4 of M-CTSIB for >/= 25 seconds    Baseline 18 seconds    Time 6    Period Weeks    Status New      PT LONG TERM GOAL #4   Title Patient will be demo ability to ambulate >/= 500 ft outdoors on unelvel  surfaces with supervision and no AD    Baseline TBA    Time 6    Period Weeks    Status New      PT LONG TERM GOAL #5   Title Patient will improve 5x sit <> stand to </= 12 seconds to demo improved balance/strength    Baseline 17.6 secs without UE support    Time 6    Period Weeks    Status New               Plan - 02/19/21 1200     Clinical Impression Statement Treatment session focused on assessment of curb negotiation and initiation of HEP with initial focus on ROM/stretching to address low back and L hip discomfort.  Pt demonstrates impaired SLS, especially on LLE due to pain in back/hip.  Will focus on adding more balance exercises to HEP next session.    Personal Factors and Comorbidities Comorbidity 3+;Time since onset of injury/illness/exacerbation    Comorbidities Anemia, Bronchitis, CKD, Hx of R Humerus Fx, CAD, Hearing Loss, Osteopenia, Vitamin B12 Deficiency    Examination-Activity Limitations Stairs;Reach Overhead;Locomotion Level    Examination-Participation Restrictions Community Activity;Interpersonal Relationship    Stability/Clinical Decision Making Stable/Uncomplicated    Rehab Potential Good    PT Frequency 1x / week    PT Duration 6 weeks   plus eval   PT  Treatment/Interventions ADLs/Self Care Home Management;Aquatic Therapy;Moist Heat;Cryotherapy;DME Instruction;Gait training;Stair training;Functional mobility training;Therapeutic activities;Neuromuscular re-education;Balance training;Therapeutic exercise;Patient/family education;Vestibular;Manual techniques;Passive range of motion    PT Next Visit Plan How is low back and exercises?  Add balance exercises to HEP: focus on dynamic gait, vision removed on complaint surfaces.    Consulted and Agree with Plan of Care Patient             Patient will benefit from skilled therapeutic intervention in order to improve the following deficits and impairments:  Decreased balance, Pain, Decreased strength, Decreased activity tolerance, Decreased endurance, Difficulty walking  Visit Diagnosis: Unsteadiness on feet  Muscle weakness (generalized)  Other abnormalities of gait and mobility     Problem List Patient Active Problem List   Diagnosis Date Noted   NSTEMI (non-ST elevated myocardial infarction) ([place]) 09/01/2020   Closed fracture of right distal humerus 04/10/2019   Dyspnea 05/09/2018   Vitamin B 12 deficiency 05/09/2018   Cough 03/06/2018   Senile osteoporosis 11/20/2017   Iron deficiency 25/42/7062   Lichen sclerosus 37/62/8315   External hemorrhoid 04/26/2017   Weakness of left leg 06/07/2016   Balance disorder 05/24/2016   Memory deficit 05/24/2016  Anemia 05/19/2016   Hearing loss 05/03/2016   Hypothyroidism 07/24/2013   Bradycardia on ECG 07/24/2013   Numbness 08/07/2012   HYPERLIPIDEMIA 12/29/2006    Rico Junker, PT, DPT 02/19/21    12:07 PM    [facility] [address] [place], [place], [address] Phone: [phone]   Fax:  [phone]  Name: Jessica [name] MRN: [id number] Date of Birth: 1940/05/27

## 2021-02-22 ENCOUNTER — Ambulatory Visit: Payer: Medicare Other

## 2021-02-26 DIAGNOSIS — M79672 Pain in left foot: Secondary | ICD-10-CM | POA: Diagnosis not present

## 2021-02-26 DIAGNOSIS — L602 Onychogryphosis: Secondary | ICD-10-CM | POA: Diagnosis not present

## 2021-02-26 DIAGNOSIS — M79671 Pain in right foot: Secondary | ICD-10-CM | POA: Diagnosis not present

## 2021-03-07 ENCOUNTER — Other Ambulatory Visit: Payer: Self-pay | Admitting: Adult Health

## 2021-03-07 ENCOUNTER — Other Ambulatory Visit: Payer: Self-pay | Admitting: Cardiovascular Disease

## 2021-03-10 ENCOUNTER — Other Ambulatory Visit: Payer: Self-pay

## 2021-03-10 ENCOUNTER — Ambulatory Visit: Payer: Medicare Other | Attending: Internal Medicine

## 2021-03-10 DIAGNOSIS — R2681 Unsteadiness on feet: Secondary | ICD-10-CM | POA: Insufficient documentation

## 2021-03-10 DIAGNOSIS — R2689 Other abnormalities of gait and mobility: Secondary | ICD-10-CM | POA: Diagnosis not present

## 2021-03-10 DIAGNOSIS — M6281 Muscle weakness (generalized): Secondary | ICD-10-CM | POA: Diagnosis not present

## 2021-03-10 NOTE — Patient Instructions (Signed)
Access Code: [email or web address] URL: [email or web address] Date: 03/10/2021 Prepared by: Baldomero Lamy  Exercises Supine Lower Trunk Rotation - 1 x daily - 7 x weekly - 1 sets - 10 reps Hooklying Single Knee to Chest Stretch - 1 x daily - 7 x weekly - 2 sets - 5 reps - 5 seconds hold Hooklying Hamstring Stretch with Strap - 1 x daily - 7 x weekly - 2 sets - 5 reps - 10 second hold Supine ITB Stretch with Strap - 1 x daily - 7 x weekly - 2 sets - 3 reps - 5 second hold Seated Piriformis Stretch (Mirrored) - 1 x daily - 7 x weekly - 2 sets - 30 second hold Side stretch, reaching to the RIGHT - 1 x daily - 7 x weekly - 3 sets - 10 reps Standing Single Leg Stance with Counter Support - 1 x daily - 7 x weekly - 2 sets - 3 reps Romberg Stance on Foam Pad with Head Rotation - 1 x daily - 7 x weekly - 2 sets - 10 reps Romberg Stance with Head Nods on Foam Pad - 1 x daily - 7 x weekly - 2 sets - 10 reps Romberg Stance Eyes Closed on Foam Pad - 1 x daily - 7 x weekly - 1 sets - 3 reps - 30 seconds hold

## 2021-03-10 NOTE — Therapy (Signed)
[facility] [address] [place], [place], [address] Phone: [phone]   Fax:  [phone]  Physical Therapy Treatment  Patient Details  Name: Jessica [name] MRN: [id number] Date of Birth: [birth year] Referring Provider (PT): Virgie Dad, MD   Encounter Date: 03/10/2021   PT End of Session - 03/10/21 0933     Visit Number 3    Number of Visits 7    Date for PT Re-Evaluation 04/09/21    Authorization Type Medicare A+B/AARP Supplement (10th Visit PN)    Progress Note Due on Visit 10    PT Start Time 0933    PT Stop Time 6767    PT Time Calculation (min) 41 min    Activity Tolerance Patient tolerated treatment well    Behavior During Therapy [facility] for tasks assessed/performed             Past Medical History:  Diagnosis Date   Anemia 12/2015   Bronchitis, chronic ([place])    in her 54s   Cataract    Chronic kidney disease    in 20s had nephritis   Closed fracture of right distal humerus    right   Coronary artery disease    Hearing loss 05/03/2016   High cholesterol    Hypothyroidism    Osteopenia    Pericarditis 01/1999   Pneumothorax 01/1999   Thyroid disorder    Vitamin B 12 deficiency     Past Surgical History:  Procedure Laterality Date   birthmark      removed,left leg   CARDIAC CATHETERIZATION     CATARACT EXTRACTION  2017   CORONARY STENT INTERVENTION N/A 09/01/2020   Procedure: CORONARY STENT INTERVENTION;  Surgeon: Belva Crome, MD;  Location: [facility];  Service: Cardiovascular;  Laterality: N/A;   LEFT HEART CATH AND CORONARY ANGIOGRAPHY N/A 09/01/2020   Procedure: LEFT HEART CATH AND CORONARY ANGIOGRAPHY;  Surgeon: Belva Crome, MD;  Location: [facility];  Service: Cardiovascular;  Laterality: N/A;   ORIF HUMERUS FRACTURE Right 04/10/2019   Procedure: RIGHT OPEN REDUCTION INTERNAL FIXATION (ORIF) DISTAL HUMERUS FRACTURE WITH EXTENSION;  Surgeon: Hiram Gash,  MD;  Location: [facility];  Service: Orthopedics;  Laterality: Right;   [place]?    There were no vitals filed for this visit.   Subjective Assessment - 03/10/21 0935     Subjective Reports that the back and hip has improved, but not back to where she was. Reports balance is just as usual.    Pertinent History Anemia, Bronchitis, CKD, Hx of R Humerus Fx, CAD, Hearing Loss, Osteopenia, Vitamin B12 Deficiency    Limitations House hold activities;Walking    Patient Stated Goals Restore balance    Currently in Pain? Yes    Pain Score 3     Pain Location Hip    Pain Orientation Left    Pain Descriptors / Indicators Aching;Sore    Pain Type Acute pain    Pain Onset 1 to 4 weeks ago              Balance Exercises - 03/10/21 0001       Balance Exercises: Standing   Standing Eyes Opened Narrow base of support (BOS);Wide (BOA);Head turns;Foam/compliant surface;Limitations    Standing Eyes Opened Limitations completed x 10 reps with wide BOS, no imabalnce, progressed to narrow BOS x 10 reps. added to HEP with narrow BOS.    Standing Eyes  Closed Wide (BOA);Narrow base of support (BOS);Foam/compliant surface;3 reps;30 secs;Limitations    Standing Eyes Closed Limitations standing with EC, progressing from wide BOS to narrow BOS. Added to HEP    SLS with Vectors Foam/compliant surface;Intermittent upper extremity assist;Limitations    SLS with Vectors Limitations standing on airex, completed alternating toe taps to cones x 15 reps bilat, cues for weight shift and standing tall prior to tap.    Rockerboard Anterior/posterior;Head turns;EO;Intermittent UE support;Limitations    Rockerboard Limitations standing on rockerboard A/P completed anterior/posterior weight shift x 15 reps without UE support. Then with maintaining board steady completed horizontal/vertical head turns x 10 reps each.    Tandem Gait Forward;3 reps;Limitations    Tandem Gait  Limitations completed tandem forward in // bars x 3 reps, CGA and intermittent touch A.    Marching Foam/compliant surface;Static;Limitations    Marching Limitations completed static alternating marching x 10 reps bilat.            Completed review of HEP and added bolded exercises as new balance additions during session.   Access Code: [email or web address] URL: [email or web address] Date: 03/10/2021 Prepared by: Baldomero Lamy   Exercises Supine Lower Trunk Rotation - 1 x daily - 7 x weekly - 1 sets - 10 reps Hooklying Single Knee to Chest Stretch - 1 x daily - 7 x weekly - 2 sets - 5 reps - 5 seconds hold Hooklying Hamstring Stretch with Strap - 1 x daily - 7 x weekly - 2 sets - 5 reps - 10 second hold Supine ITB Stretch with Strap - 1 x daily - 7 x weekly - 2 sets - 3 reps - 5 second hold Seated Piriformis Stretch (Mirrored) - 1 x daily - 7 x weekly - 2 sets - 30 second hold Side stretch, reaching to the RIGHT - 1 x daily - 7 x weekly - 3 sets - 10 reps Standing Single Leg Stance with Counter Support - 1 x daily - 7 x weekly - 2 sets - 3 reps Romberg Stance on Foam Pad with Head Rotation - 1 x daily - 7 x weekly - 2 sets - 10 reps Romberg Stance with Head Nods on Foam Pad - 1 x daily - 7 x weekly - 2 sets - 10 reps Romberg Stance Eyes Closed on Foam Pad - 1 x daily - 7 x weekly - 1 sets - 3 reps - 30 seconds hold     PT Education - 03/10/21 1009     Education Details HEP Update    Person(s) Educated Patient    Methods Explanation;Demonstration;Handout    Comprehension Returned demonstration;Verbalized understanding              PT Short Term Goals - 02/12/21 1026       PT SHORT TERM GOAL #1   Title Patient will be independent with initial HEP for balance (ALL STGs Due: 03/05/21)    Baseline no HEP provided    Time 3    Period Weeks    Status New    Target Date 03/05/21      PT SHORT TERM GOAL #2   Title Patient will be able to ascend/descend a curb without  AD and no LOB for improved safety with community mobility    Baseline TBA    Time 3    Period Weeks    Status New               PT Long Term Goals - 02/12/21 1028  PT LONG TERM GOAL #1   Title Patient will be independent with final HEP for balance (ALL LTGs Due: 04/02/21)    Baseline no HEP established    Time 6    Period Weeks    Status New    Target Date 04/02/21      PT LONG TERM GOAL #2   Title Patient will improve FGA to >/= 25/30 to demo improved balance and reduced fall risk    Baseline 22/30    Time 6    Period Weeks    Status New      PT LONG TERM GOAL #3   Title Patient will be able to hold situation 4 of M-CTSIB for >/= 25 seconds    Baseline 18 seconds    Time 6    Period Weeks    Status New      PT LONG TERM GOAL #4   Title Patient will be demo ability to ambulate >/= 500 ft outdoors on unelvel  surfaces with supervision and no AD    Baseline TBA    Time 6    Period Weeks    Status New      PT LONG TERM GOAL #5   Title Patient will improve 5x sit <> stand to </= 12 seconds to demo improved balance/strength    Baseline 17.6 secs without UE support    Time 6    Period Weeks    Status New                   Plan - 03/10/21 1122     Clinical Impression Statement Reviewed HEP and continued to add balance exercises to HEP during today's session. Patient tolerating balance activities well, most challenge noted with narrow BOS and SLS activities today. Will continue per POC and focus more on balance as patient has had improvements in low back and L Hip at this time.    Personal Factors and Comorbidities Comorbidity 3+;Time since onset of injury/illness/exacerbation    Comorbidities Anemia, Bronchitis, CKD, Hx of R Humerus Fx, CAD, Hearing Loss, Osteopenia, Vitamin B12 Deficiency    Examination-Activity Limitations Stairs;Reach Overhead;Locomotion Level    Examination-Participation Restrictions Community Activity;Interpersonal Relationship     Stability/Clinical Decision Making Stable/Uncomplicated    Rehab Potential Good    PT Frequency 1x / week    PT Duration 6 weeks   plus eval   PT Treatment/Interventions ADLs/Self Care Home Management;Aquatic Therapy;Moist Heat;Cryotherapy;DME Instruction;Gait training;Stair training;Functional mobility training;Therapeutic activities;Neuromuscular re-education;Balance training;Therapeutic exercise;Patient/family education;Vestibular;Manual techniques;Passive range of motion    PT Next Visit Plan How was balance exercises added to HEP? continue focus on dynamic gait, vision removed on complaint surfaces, gait with head turns. SLS activities    Consulted and Agree with Plan of Care Patient             Patient will benefit from skilled therapeutic intervention in order to improve the following deficits and impairments:  Decreased balance, Pain, Decreased strength, Decreased activity tolerance, Decreased endurance, Difficulty walking  Visit Diagnosis: Unsteadiness on feet  Other abnormalities of gait and mobility  Muscle weakness (generalized)     Problem List Patient Active Problem List   Diagnosis Date Noted   NSTEMI (non-ST elevated myocardial infarction) ([place]) 09/01/2020   Closed fracture of right distal humerus 04/10/2019   Dyspnea 05/09/2018   Vitamin B 12 deficiency 05/09/2018   Cough 03/06/2018   Senile osteoporosis 11/20/2017   Iron deficiency 10/62/6948   Lichen sclerosus 54/62/7035   External  hemorrhoid 04/26/2017   Weakness of left leg 06/07/2016   Balance disorder 05/24/2016   Memory deficit 05/24/2016   Anemia 05/19/2016   Hearing loss 05/03/2016   Hypothyroidism 07/24/2013   Bradycardia on ECG 07/24/2013   Numbness 08/07/2012   HYPERLIPIDEMIA 12/29/2006    Jones Bales, PT, DPT 03/10/2021, 11:24 AM  [facility] [address] [place], [place], [address] Phone: [phone]   Fax:   [phone]  Name: Jessica [name] MRN: [id number] Date of Birth: Aug 02, 1940

## 2021-03-16 ENCOUNTER — Other Ambulatory Visit: Payer: Medicare Other | Admitting: *Deleted

## 2021-03-16 ENCOUNTER — Other Ambulatory Visit: Payer: Self-pay

## 2021-03-16 DIAGNOSIS — I2510 Atherosclerotic heart disease of native coronary artery without angina pectoris: Secondary | ICD-10-CM

## 2021-03-16 DIAGNOSIS — E782 Mixed hyperlipidemia: Secondary | ICD-10-CM

## 2021-03-16 LAB — LIPID PANEL
Chol/HDL Ratio: 2.7 ratio (ref 0.0–4.4)
Cholesterol, Total: 149 mg/dL (ref 100–199)
HDL: 56 mg/dL (ref >39)
LDL Chol Calc (NIH): 70 mg/dL (ref 0–99)
Triglycerides: 132 mg/dL (ref 0–149)
VLDL Cholesterol Cal: 23 mg/dL (ref 5–40)

## 2021-03-16 LAB — HEPATIC FUNCTION PANEL
ALT: 20 IU/L (ref 0–32)
AST: 14 IU/L (ref 0–40)
Albumin: 4.2 g/dL (ref 3.7–4.7)
Alkaline Phosphatase: 59 IU/L (ref 44–121)
Bilirubin Total: 0.4 mg/dL (ref 0.0–1.2)
Bilirubin, Direct: 0.16 mg/dL (ref 0.00–0.40)
Total Protein: 6.1 g/dL (ref 6.0–8.5)

## 2021-03-17 ENCOUNTER — Ambulatory Visit: Payer: Medicare Other

## 2021-03-17 DIAGNOSIS — R2689 Other abnormalities of gait and mobility: Secondary | ICD-10-CM | POA: Diagnosis not present

## 2021-03-17 DIAGNOSIS — M6281 Muscle weakness (generalized): Secondary | ICD-10-CM

## 2021-03-17 DIAGNOSIS — R2681 Unsteadiness on feet: Secondary | ICD-10-CM

## 2021-03-17 NOTE — Patient Instructions (Signed)
Access Code: [email or web address] URL: [email or web address] Date: 03/17/2021 Prepared by: Baldomero Lamy  Exercises Supine Lower Trunk Rotation - 1 x daily - 7 x weekly - 1 sets - 10 reps Hooklying Single Knee to Chest Stretch - 1 x daily - 7 x weekly - 2 sets - 5 reps - 5 seconds hold Hooklying Hamstring Stretch with Strap - 1 x daily - 7 x weekly - 2 sets - 5 reps - 10 second hold Supine ITB Stretch with Strap - 1 x daily - 7 x weekly - 2 sets - 3 reps - 5 second hold Seated Piriformis Stretch (Mirrored) - 1 x daily - 7 x weekly - 2 sets - 30 second hold Side stretch, reaching to the RIGHT - 1 x daily - 7 x weekly - 3 sets - 10 reps Standing Single Leg Stance with Counter Support - 1 x daily - 7 x weekly - 2 sets - 3 reps Romberg Stance on Foam Pad with Head Rotation - 1 x daily - 7 x weekly - 2 sets - 10 reps Romberg Stance with Head Nods on Foam Pad - 1 x daily - 7 x weekly - 2 sets - 10 reps Romberg Stance Eyes Closed on Foam Pad - 1 x daily - 7 x weekly - 1 sets - 3 reps - 30 seconds hold Standing Toe Taps - 1 x daily - 7 x weekly - 1 sets - 10 reps

## 2021-03-17 NOTE — Therapy (Signed)
[facility] [address] [place], [place], [address] Phone: [phone]   Fax:  [phone]  Physical Therapy Treatment  Patient Details  Name: Jessica [name] MRN: [id number] Date of Birth: 12/08/1940 Referring Provider (PT): Virgie Dad, MD   Encounter Date: 03/17/2021   PT End of Session - 03/17/21 0934     Visit Number 4    Number of Visits 7    Date for PT Re-Evaluation 04/09/21    Authorization Type Medicare A+B/AARP Supplement (10th Visit PN)    Progress Note Due on Visit 10    PT Start Time [phone]   Patient arriving late   PT Stop Time 1016    PT Time Calculation (min) 42 min    Activity Tolerance Patient tolerated treatment well    Behavior During Therapy [facility] for tasks assessed/performed             Past Medical History:  Diagnosis Date   Anemia 12/2015   Bronchitis, chronic ([place])    in her 51s   Cataract    Chronic kidney disease    in 20s had nephritis   Closed fracture of right distal humerus    right   Coronary artery disease    Hearing loss 05/03/2016   High cholesterol    Hypothyroidism    Osteopenia    Pericarditis 01/1999   Pneumothorax 01/1999   Thyroid disorder    Vitamin B 12 deficiency     Past Surgical History:  Procedure Laterality Date   birthmark      removed,left leg   CARDIAC CATHETERIZATION     CATARACT EXTRACTION  2017   CORONARY STENT INTERVENTION N/A 09/01/2020   Procedure: CORONARY STENT INTERVENTION;  Surgeon: Belva Crome, MD;  Location: [facility];  Service: Cardiovascular;  Laterality: N/A;   LEFT HEART CATH AND CORONARY ANGIOGRAPHY N/A 09/01/2020   Procedure: LEFT HEART CATH AND CORONARY ANGIOGRAPHY;  Surgeon: Belva Crome, MD;  Location: [facility];  Service: Cardiovascular;  Laterality: N/A;   ORIF HUMERUS FRACTURE Right 04/10/2019   Procedure: RIGHT OPEN REDUCTION INTERNAL FIXATION (ORIF) DISTAL HUMERUS FRACTURE WITH EXTENSION;   Surgeon: Hiram Gash, MD;  Location: [facility];  Service: Orthopedics;  Laterality: Right;   [place]?    There were no vitals filed for this visit.   Subjective Assessment - 03/17/21 0934     Subjective Patient reports no new changes. Her facility has a pad she can use for her balance exercises, but believes it is firmer. No falls.    Pertinent History Anemia, Bronchitis, CKD, Hx of R Humerus Fx, CAD, Hearing Loss, Osteopenia, Vitamin B12 Deficiency    Limitations House hold activities;Walking    Patient Stated Goals Restore balance    Currently in Pain? Yes    Pain Score 3     Pain Location Hip    Pain Orientation Left    Pain Descriptors / Indicators Aching;Sore    Pain Type Acute pain    Pain Onset 1 to 4 weeks ago    Pain Frequency Intermittent               [facility] Adult PT Treatment/Exercise - 03/17/21 0001       Ambulation/Gait   Ambulation/Gait Yes    Ambulation/Gait Assistance 5: Supervision    Assistive device None    Ambulation Surface Level;Indoor    Curb 5: Supervision    Curb  Details (indicate cue type and reason) continue to cues to ascend with RLE and descend with LLE, completed x 5 reps with proper sequencing and close supervision. no LOB noted.                 Balance Exercises - 03/17/21 0001       Balance Exercises: Standing   Standing Eyes Opened Narrow base of support (BOS);Foam/compliant surface;Limitations    Standing Eyes Opened Limitations standing EO 2 x 30 seconds narrow BOS    Standing Eyes Closed Narrow base of support (BOS);Head turns;Foam/compliant surface    Standing Eyes Closed Limitations standing with EC narrow BOS 3 x 30 seconds, then added horizontal/vertical head turns x 10 reps each direction. more challenge noted with Horizontal > Vertical    Tandem Stance Eyes closed;Foam/compliant surface;Limitations    Tandem Stance Time standing on airex, completed partial tandem with EO  x 30 seconds, then added horiz/vertical head turns x 10 reps each direction    SLS with Vectors Foam/compliant surface;Intermittent upper extremity assist;Limitations    SLS with Vectors Limitations standing on airex, completed alternating toe taps to cones x 10 reps bilat forward, then progressed x 10 reps crossover. cues for weight shift and standing tall prior to tap. educated patient on how to complete this safely at home, and added to HEP    Step Ups Forward;6 inch;Limitations    Step Ups Limitations standing on airex, completed alternating step ups to 1st step without UE support x 10 reps bilat.    Step Over Hurdles / Cones completed obstacle course including stepping over black beam, and then step over cone with toe tap to cone prior to stepping over x 4 laps. increased challenge with toe tap especially ntoed on LLE. Cues for proper technique and clearance to avoid overstriding with step. CGA throughout.    Marching Foam/compliant surface;Static;Limitations    Marching Limitations on blue mat, completed alternating forward marching x 4 laps, slowed pace to promote SLS                PT Education - 03/17/21 1018     Education Details HEP Addition (Toe Taps)    Person(s) Educated Patient    Methods Explanation;Demonstration;Handout    Comprehension Verbalized understanding;Returned demonstration              PT Short Term Goals - 03/17/21 1021       PT SHORT TERM GOAL #1   Title Patient will be independent with initial HEP for balance (ALL STGs Due: 03/05/21)    Baseline no HEP provided; reports independence with current HEP    Time 3    Period Weeks    Status New    Target Date 03/05/21      PT SHORT TERM GOAL #2   Title Patient will be able to ascend/descend a curb without AD and no LOB for improved safety with community mobility    Baseline TBA; ascend/descend single curb no AD, supervision    Time 3    Period Weeks    Status Achieved               PT  Long Term Goals - 02/12/21 1028       PT LONG TERM GOAL #1   Title Patient will be independent with final HEP for balance (ALL LTGs Due: 04/02/21)    Baseline no HEP established    Time 6    Period Weeks    Status New  Target Date 04/02/21      PT LONG TERM GOAL #2   Title Patient will improve FGA to >/= 25/30 to demo improved balance and reduced fall risk    Baseline 22/30    Time 6    Period Weeks    Status New      PT LONG TERM GOAL #3   Title Patient will be able to hold situation 4 of M-CTSIB for >/= 25 seconds    Baseline 18 seconds    Time 6    Period Weeks    Status New      PT LONG TERM GOAL #4   Title Patient will be demo ability to ambulate >/= 500 ft outdoors on unelvel  surfaces with supervision and no AD    Baseline TBA    Time 6    Period Weeks    Status New      PT LONG TERM GOAL #5   Title Patient will improve 5x sit <> stand to </= 12 seconds to demo improved balance/strength    Baseline 17.6 secs without UE support    Time 6    Period Weeks    Status New                   Plan - 03/17/21 1029     Clinical Impression Statement Patient able to meet all STGs, demonstrating improved sequencing and balace with curb negotiation. Continued rest of session focused on progression of balance activites with high focus on SLS. Add toe taps to HEP for continued focus on this outside of PT session. Patient tolerating well today, will continue to progress toward all LTGs.    Personal Factors and Comorbidities Comorbidity 3+;Time since onset of injury/illness/exacerbation    Comorbidities Anemia, Bronchitis, CKD, Hx of R Humerus Fx, CAD, Hearing Loss, Osteopenia, Vitamin B12 Deficiency    Examination-Activity Limitations Stairs;Reach Overhead;Locomotion Level    Examination-Participation Restrictions Community Activity;Interpersonal Relationship    Stability/Clinical Decision Making Stable/Uncomplicated    Rehab Potential Good    PT Frequency 1x / week     PT Duration 6 weeks   plus eval   PT Treatment/Interventions ADLs/Self Care Home Management;Aquatic Therapy;Moist Heat;Cryotherapy;DME Instruction;Gait training;Stair training;Functional mobility training;Therapeutic activities;Neuromuscular re-education;Balance training;Therapeutic exercise;Patient/family education;Vestibular;Manual techniques;Passive range of motion    PT Next Visit Plan How was balance exercises added to HEP? continue focus on dynamic gait, vision removed on complaint surfaces, gait with head turns. SLS activities    Consulted and Agree with Plan of Care Patient             Patient will benefit from skilled therapeutic intervention in order to improve the following deficits and impairments:  Decreased balance, Pain, Decreased strength, Decreased activity tolerance, Decreased endurance, Difficulty walking  Visit Diagnosis: Unsteadiness on feet  Other abnormalities of gait and mobility  Muscle weakness (generalized)     Problem List Patient Active Problem List   Diagnosis Date Noted   NSTEMI (non-ST elevated myocardial infarction) ([place]) 09/01/2020   Closed fracture of right distal humerus 04/10/2019   Dyspnea 05/09/2018   Vitamin B 12 deficiency 05/09/2018   Cough 03/06/2018   Senile osteoporosis 11/20/2017   Iron deficiency 54/27/0623   Lichen sclerosus 76/28/3151   External hemorrhoid 04/26/2017   Weakness of left leg 06/07/2016   Balance disorder 05/24/2016   Memory deficit 05/24/2016   Anemia 05/19/2016   Hearing loss 05/03/2016   Hypothyroidism 07/24/2013   Bradycardia on ECG 07/24/2013   Numbness 08/07/2012  HYPERLIPIDEMIA 12/29/2006    Jones Bales, PT, DPT 03/17/2021, 10:31 AM  [facility] [address] [place], [place], [address] Phone: [phone]   Fax:  [phone]  Name: Jessica [name] MRN: [id number] Date of Birth: 1940-04-30

## 2021-03-24 ENCOUNTER — Ambulatory Visit: Payer: Medicare Other

## 2021-03-24 ENCOUNTER — Other Ambulatory Visit: Payer: Self-pay

## 2021-03-24 DIAGNOSIS — R2681 Unsteadiness on feet: Secondary | ICD-10-CM | POA: Diagnosis not present

## 2021-03-24 DIAGNOSIS — R2689 Other abnormalities of gait and mobility: Secondary | ICD-10-CM | POA: Diagnosis not present

## 2021-03-24 DIAGNOSIS — M6281 Muscle weakness (generalized): Secondary | ICD-10-CM

## 2021-03-24 NOTE — Therapy (Signed)
[facility] [address] [place], [place], [address] Phone: [phone]   Fax:  [phone]  Physical Therapy Treatment  Patient Details  Name: Jessica [name] MRN: [id number] Date of Birth: [birth year] Referring Provider (PT): Virgie Dad, MD   Encounter Date: 03/24/2021   PT End of Session - 03/24/21 0931     Visit Number 5    Number of Visits 7    Date for PT Re-Evaluation 04/09/21    Authorization Type Medicare A+B/AARP Supplement (10th Visit PN)    Progress Note Due on Visit 10    PT Start Time 0932    PT Stop Time 3383    PT Time Calculation (min) 42 min    Activity Tolerance Patient tolerated treatment well    Behavior During Therapy [facility] for tasks assessed/performed             Past Medical History:  Diagnosis Date   Anemia 12/2015   Bronchitis, chronic ([place])    in her 71s   Cataract    Chronic kidney disease    in 20s had nephritis   Closed fracture of right distal humerus    right   Coronary artery disease    Hearing loss 05/03/2016   High cholesterol    Hypothyroidism    Osteopenia    Pericarditis 01/1999   Pneumothorax 01/1999   Thyroid disorder    Vitamin B 12 deficiency     Past Surgical History:  Procedure Laterality Date   birthmark      removed,left leg   CARDIAC CATHETERIZATION     CATARACT EXTRACTION  2017   CORONARY STENT INTERVENTION N/A 09/01/2020   Procedure: CORONARY STENT INTERVENTION;  Surgeon: Belva Crome, MD;  Location: [facility];  Service: Cardiovascular;  Laterality: N/A;   LEFT HEART CATH AND CORONARY ANGIOGRAPHY N/A 09/01/2020   Procedure: LEFT HEART CATH AND CORONARY ANGIOGRAPHY;  Surgeon: Belva Crome, MD;  Location: [facility];  Service: Cardiovascular;  Laterality: N/A;   ORIF HUMERUS FRACTURE Right 04/10/2019   Procedure: RIGHT OPEN REDUCTION INTERNAL FIXATION (ORIF) DISTAL HUMERUS FRACTURE WITH EXTENSION;  Surgeon: Hiram Gash,  MD;  Location: [facility];  Service: Orthopedics;  Laterality: Right;   [place]?    There were no vitals filed for this visit.   Subjective Assessment - 03/24/21 0931     Subjective Patient reports did an exercise class over the weekend, reports muscles are sore (ones that were previously aggravated). Aggravated the bursitis. No falls.    Pertinent History Anemia, Bronchitis, CKD, Hx of R Humerus Fx, CAD, Hearing Loss, Osteopenia, Vitamin B12 Deficiency    Limitations House hold activities;Walking    Patient Stated Goals Restore balance    Currently in Pain? Yes    Pain Score 3     Pain Location Hip    Pain Orientation Left    Pain Descriptors / Indicators Aching;Sore    Pain Type Acute pain    Pain Onset 1 to 4 weeks ago    Aggravating Factors  movement    Pain Relieving Factors tylenol    Multiple Pain Sites Yes    Pain Score 4    Pain Location Shoulder    Pain Orientation Left    Pain Descriptors / Indicators Aching;Sore    Pain Type Acute pain    Pain Onset In the past 7 days    Pain Frequency Intermittent  [facility] Adult PT Treatment/Exercise - 03/24/21 0001       Ambulation/Gait   Ambulation/Gait Yes    Ambulation/Gait Assistance 5: Supervision    Ambulation/Gait Assistance Details throughout therapy gym with activities    Ambulation Distance (Feet) --   clinic distance   Assistive device None    Gait Pattern Antalgic    Ambulation Surface Level;Indoor               Balance Exercises - 03/24/21 0001       Balance Exercises: Standing   SLS with Vectors Foam/compliant surface;Intermittent upper extremity assist;Limitations    SLS with Vectors Limitations standing on airex, completed alternating toe taps to cones x 10 reps bilat forward, then progressed x 10 reps crossover. cues for weight shift and standing tall prior to tap. educated patient on how to complete this safely at home, and added to HEP     Standing, One Foot on a Step Eyes open;Eyes closed;Foam/compliant surface;6 inch;3 reps;30 secs;Limitations    Standing, One Foot on a Step Limitations posterior leg standing on airex, no UE support. added in EC 2 x 30 seconds    Rockerboard Anterior/posterior;Head turns;EO;Intermittent UE support    Rockerboard Limitations standing on rockerboard A/P completed maintaining board steady with EO, added in horizontal/vertical head turns 2 x 10 reps. more challenge with horizontal > vertical.    Step Over Hurdles / Cones in // bars with orange hurdles: completed step to pattern over hurdles x 3 laps, then reciprocal stepping x 3 laps without UE support CGA. Then completed lateral side stepping over hurdles x 3 laps, no UE support. Cues for step length.    Sit to Stand Standard surface;Foam/compliant surface;Limitations    Sit to Stand Limitations sit <> stands with UE support from mat standing on airex x 10 reps, cues for control with descent.    Other Standing Exercises completed SLS activity with opposite LE palced on soccerball, completed A/P rolls and lateral rolls x 10 reps. single UE required with lateral due to increased balance challenge                  PT Short Term Goals - 03/17/21 1021       PT SHORT TERM GOAL #1   Title Patient will be independent with initial HEP for balance (ALL STGs Due: 03/05/21)    Baseline no HEP provided; reports independence with current HEP    Time 3    Period Weeks    Status New    Target Date 03/05/21      PT SHORT TERM GOAL #2   Title Patient will be able to ascend/descend a curb without AD and no LOB for improved safety with community mobility    Baseline TBA; ascend/descend single curb no AD, supervision    Time 3    Period Weeks    Status Achieved               PT Long Term Goals - 02/12/21 1028       PT LONG TERM GOAL #1   Title Patient will be independent with final HEP for balance (ALL LTGs Due: 04/02/21)    Baseline no  HEP established    Time 6    Period Weeks    Status New    Target Date 04/02/21      PT LONG TERM GOAL #2   Title Patient will improve FGA to >/= 25/30 to demo improved balance and reduced fall risk  Baseline 22/30    Time 6    Period Weeks    Status New      PT LONG TERM GOAL #3   Title Patient will be able to hold situation 4 of M-CTSIB for >/= 25 seconds    Baseline 18 seconds    Time 6    Period Weeks    Status New      PT LONG TERM GOAL #4   Title Patient will be demo ability to ambulate >/= 500 ft outdoors on unelvel  surfaces with supervision and no AD    Baseline TBA    Time 6    Period Weeks    Status New      PT LONG TERM GOAL #5   Title Patient will improve 5x sit <> stand to </= 12 seconds to demo improved balance/strength    Baseline 17.6 secs without UE support    Time 6    Period Weeks    Status New                   Plan - 03/24/21 1014     Clinical Impression Statement Continued today's session focused on high level balance and cotninued actvities to challenge SLS. Most challenge noted with SLS on LLE > RLE, as well as eyes closed on complaint surfaces. Will continue per POC.    Personal Factors and Comorbidities Comorbidity 3+;Time since onset of injury/illness/exacerbation    Comorbidities Anemia, Bronchitis, CKD, Hx of R Humerus Fx, CAD, Hearing Loss, Osteopenia, Vitamin B12 Deficiency    Examination-Activity Limitations Stairs;Reach Overhead;Locomotion Level    Examination-Participation Restrictions Community Activity;Interpersonal Relationship    Stability/Clinical Decision Making Stable/Uncomplicated    Rehab Potential Good    PT Frequency 1x / week    PT Duration 6 weeks   plus eval   PT Treatment/Interventions ADLs/Self Care Home Management;Aquatic Therapy;Moist Heat;Cryotherapy;DME Instruction;Gait training;Stair training;Functional mobility training;Therapeutic activities;Neuromuscular re-education;Balance training;Therapeutic  exercise;Patient/family education;Vestibular;Manual techniques;Passive range of motion    PT Next Visit Plan continue focus on dynamic gait, vision removed on complaint surfaces, gait with head turns. SLS activities. review and provided progressive HEP.    Consulted and Agree with Plan of Care Patient             Patient will benefit from skilled therapeutic intervention in order to improve the following deficits and impairments:  Decreased balance, Pain, Decreased strength, Decreased activity tolerance, Decreased endurance, Difficulty walking  Visit Diagnosis: Unsteadiness on feet  Other abnormalities of gait and mobility  Muscle weakness (generalized)     Problem List Patient Active Problem List   Diagnosis Date Noted   NSTEMI (non-ST elevated myocardial infarction) ([place]) 09/01/2020   Closed fracture of right distal humerus 04/10/2019   Dyspnea 05/09/2018   Vitamin B 12 deficiency 05/09/2018   Cough 03/06/2018   Senile osteoporosis 11/20/2017   Iron deficiency 26/83/4196   Lichen sclerosus 22/29/7989   External hemorrhoid 04/26/2017   Weakness of left leg 06/07/2016   Balance disorder 05/24/2016   Memory deficit 05/24/2016   Anemia 05/19/2016   Hearing loss 05/03/2016   Hypothyroidism 07/24/2013   Bradycardia on ECG 07/24/2013   Numbness 08/07/2012   HYPERLIPIDEMIA 12/29/2006    Jones Bales, PT, DPT 03/24/2021, 10:19 AM  [facility] [address] [place], [place], [address] Phone: [phone]   Fax:  [phone]  Name: Jessica [name] MRN: [id number] Date of Birth: Jul 09, 1940

## 2021-03-25 DIAGNOSIS — E782 Mixed hyperlipidemia: Secondary | ICD-10-CM | POA: Diagnosis not present

## 2021-03-25 DIAGNOSIS — I214 Non-ST elevation (NSTEMI) myocardial infarction: Secondary | ICD-10-CM | POA: Diagnosis not present

## 2021-03-25 LAB — COMPLETE METABOLIC PANEL WITH GFR
AG Ratio: 1.8 (calc) (ref 1.0–2.5)
ALT: 27 U/L (ref 6–29)
AST: 27 U/L (ref 10–35)
Albumin: 3.9 g/dL (ref 3.6–5.1)
Alkaline phosphatase (APISO): 54 U/L (ref 37–153)
BUN/Creatinine Ratio: 26 (calc) — ABNORMAL HIGH (ref 6–22)
BUN: 15 mg/dL (ref 7–25)
CO2: 25 mmol/L (ref 20–32)
Calcium: 8.7 mg/dL (ref 8.6–10.4)
Chloride: 107 mmol/L (ref 98–110)
Creat: 0.58 mg/dL — ABNORMAL LOW (ref 0.60–1.00)
Globulin: 2.2 g/dL (calc) (ref 1.9–3.7)
Glucose, Bld: 97 mg/dL (ref 65–99)
Potassium: 4.2 mmol/L (ref 3.5–5.3)
Sodium: 139 mmol/L (ref 135–146)
Total Bilirubin: 0.5 mg/dL (ref 0.2–1.2)
Total Protein: 6.1 g/dL (ref 6.1–8.1)
eGFR: 92 mL/min/{1.73_m2} (ref >=60)

## 2021-03-25 LAB — CBC WITH DIFFERENTIAL/PLATELET
Absolute Monocytes: 824 cells/uL (ref 200–950)
Basophils Absolute: 43 cells/uL (ref 0–200)
Basophils Relative: 0.7 %
Eosinophils Absolute: 110 cells/uL (ref 15–500)
Eosinophils Relative: 1.8 %
HCT: 39 % (ref 35.0–45.0)
Hemoglobin: 13.1 g/dL (ref 11.7–15.5)
Lymphs Abs: 1861 cells/uL (ref 850–3900)
MCH: 32.3 pg (ref 27.0–33.0)
MCHC: 33.6 g/dL (ref 32.0–36.0)
MCV: 96.3 fL (ref 80.0–100.0)
MPV: 11 fL (ref 7.5–12.5)
Monocytes Relative: 13.5 %
Neutro Abs: 3264 cells/uL (ref 1500–7800)
Neutrophils Relative %: 53.5 %
Platelets: 221 10*3/uL (ref 140–400)
RBC: 4.05 10*6/uL (ref 3.80–5.10)
RDW: 12.9 % (ref 11.0–15.0)
Total Lymphocyte: 30.5 %
WBC: 6.1 10*3/uL (ref 3.8–10.8)

## 2021-03-31 ENCOUNTER — Non-Acute Institutional Stay: Payer: Medicare Other | Admitting: Internal Medicine

## 2021-03-31 ENCOUNTER — Ambulatory Visit: Payer: Medicare Other

## 2021-03-31 ENCOUNTER — Encounter: Payer: Self-pay | Admitting: Internal Medicine

## 2021-03-31 ENCOUNTER — Other Ambulatory Visit: Payer: Self-pay

## 2021-03-31 VITALS — BP 131/80 | HR 58 | Temp 96.2°F | Ht 64.0 in | Wt 155.8 lb

## 2021-03-31 DIAGNOSIS — K429 Umbilical hernia without obstruction or gangrene: Secondary | ICD-10-CM

## 2021-03-31 DIAGNOSIS — R2681 Unsteadiness on feet: Secondary | ICD-10-CM | POA: Diagnosis not present

## 2021-03-31 DIAGNOSIS — M810 Age-related osteoporosis without current pathological fracture: Secondary | ICD-10-CM

## 2021-03-31 DIAGNOSIS — I214 Non-ST elevation (NSTEMI) myocardial infarction: Secondary | ICD-10-CM | POA: Diagnosis not present

## 2021-03-31 DIAGNOSIS — E039 Hypothyroidism, unspecified: Secondary | ICD-10-CM | POA: Diagnosis not present

## 2021-03-31 DIAGNOSIS — E611 Iron deficiency: Secondary | ICD-10-CM

## 2021-03-31 DIAGNOSIS — G3184 Mild cognitive impairment, so stated: Secondary | ICD-10-CM

## 2021-03-31 DIAGNOSIS — R5383 Other fatigue: Secondary | ICD-10-CM | POA: Diagnosis not present

## 2021-03-31 DIAGNOSIS — R2689 Other abnormalities of gait and mobility: Secondary | ICD-10-CM

## 2021-03-31 DIAGNOSIS — E538 Deficiency of other specified B group vitamins: Secondary | ICD-10-CM | POA: Diagnosis not present

## 2021-03-31 DIAGNOSIS — M6281 Muscle weakness (generalized): Secondary | ICD-10-CM

## 2021-03-31 DIAGNOSIS — E782 Mixed hyperlipidemia: Secondary | ICD-10-CM | POA: Diagnosis not present

## 2021-03-31 NOTE — Progress Notes (Signed)
Location:  [facility] of Service:  Clinic (12)  Provider:   Code Status:  Goals of Care:  Advanced Directives 02/12/2021  Does Patient Have a Medical Advance Directive? Yes  Type of Paramedic of [place];Living will  Does patient want to make changes to medical advance directive? No - Patient declined  Copy of [place] in Chart? -  Would patient like information on creating a medical advance directive? -  Pre-existing out of facility DNR order (yellow form or pink MOST form) -     Chief Complaint  Patient presents with   Medical Management of Chronic Issues   Quality Metric Gaps    Hep C All 5 covid-19 vaccines in epic    HPI: Patient is a 81 y.o. female seen today for medical management of chronic diseases.    Patient has a history of distal humerus fracture History of umbilical hernia Surgery said no issues History of osteoporosis on Fosamax Hyperlipidemia, hypothyroidism, history of peripheral neuropathy history of shortness of breath with exertion   Patient was admitted in the hospital from 6/27-6/29 for NSTEMI cardiac cath which showed subtotal occlusion of an LAD.  Underwent successful angioplasty and DES.  Does have residual 50% RCA lesion Plan per cardiology of [facility] with aspirin and Brilinta for 1 year Also was started on Crestor Her EF was 50-55% with No Wall motion Abnormalities now  Doing well No Chest pain or SOB No Dizziness no Falls Continues to do Balancing exercise Only Complains of feeling Fatigue and No energy Wants to go and Visit son who lives in [place] Past Medical History:  Diagnosis Date   Anemia 12/2015   Bronchitis, chronic ([facility])    in her 77s   Cataract    Chronic kidney disease    in 20s had nephritis   Closed fracture of right distal humerus    right   Coronary artery disease    Hearing loss 05/03/2016   High cholesterol    Hypothyroidism    Osteopenia     Pericarditis 01/1999   Pneumothorax 01/1999   Thyroid disorder    Vitamin B 12 deficiency     Past Surgical History:  Procedure Laterality Date   birthmark      removed,left leg   CARDIAC CATHETERIZATION     CATARACT EXTRACTION  2017   CORONARY STENT INTERVENTION N/A 09/01/2020   Procedure: CORONARY STENT INTERVENTION;  Surgeon: Belva Crome, MD;  Location: [facility];  Service: Cardiovascular;  Laterality: N/A;   LEFT HEART CATH AND CORONARY ANGIOGRAPHY N/A 09/01/2020   Procedure: LEFT HEART CATH AND CORONARY ANGIOGRAPHY;  Surgeon: Belva Crome, MD;  Location: [facility];  Service: Cardiovascular;  Laterality: N/A;   ORIF HUMERUS FRACTURE Right 04/10/2019   Procedure: RIGHT OPEN REDUCTION INTERNAL FIXATION (ORIF) DISTAL HUMERUS FRACTURE WITH EXTENSION;  Surgeon: Hiram Gash, MD;  Location: [facility];  Service: Orthopedics;  Laterality: Right;   [place]?    Allergies  Allergen Reactions   Atorvastatin     Muscle weakness    Outpatient Encounter Medications as of 03/31/2021  Medication Sig   acetaminophen (TYLENOL) 650 MG CR tablet Take 1,300 mg by mouth every 8 (eight) hours as needed for pain.   alendronate (FOSAMAX) 70 MG tablet TAKE 1 TABLET BY MOUTH EVERY 7 DAYS WITH A FULL GLASS OF WATER AND ON AN EMPTY STOMACH (Patient taking differently:  Take 70 mg by mouth every Saturday.)   aspirin 81 MG chewable tablet Chew 1 tablet (81 mg total) by mouth daily.   Calcium Carb-Cholecalciferol (CALCIUM 600+D) 600-800 MG-UNIT TABS Take 2 tablets by mouth daily.   cholecalciferol (VITAMIN D) 1000 UNITS tablet Take 2,000 Units by mouth daily.    ferrous sulfate 325 (65 FE) MG tablet Take 325 mg by mouth daily with breakfast.   l-methylfolate-B6-B12 (METANX) 3-35-2 MG TABS tablet Take 1 tablet by mouth daily.   levothyroxine (SYNTHROID) 112 MCG tablet Take 1 tablet (112 mcg total) by mouth daily.   metoprolol succinate  (TOPROL-XL) 25 MG 24 hr tablet TAKE 1/2 TABLET BY MOUTH DAILY (Patient taking differently: Take 12.5 mg by mouth daily.)   nitroGLYCERIN (NITROSTAT) 0.4 MG SL tablet Place 1 tablet (0.4 mg total) under the tongue every 5 (five) minutes as needed for chest pain.   rosuvastatin (CRESTOR) 20 MG tablet TAKE 1 TABLET(20 MG) BY MOUTH DAILY   ticagrelor (BRILINTA) 90 MG TABS tablet Take 1 tablet (90 mg total) by mouth 2 (two) times daily.   vitamin C (ASCORBIC ACID) 500 MG tablet Take 500 mg by mouth daily.   [DISCONTINUED] calcium carbonate (OS-CAL) 1250 (500 Ca) MG chewable tablet Chew 1 tablet (1,250 mg total) by mouth daily.   No facility-administered encounter medications on file as of 03/31/2021.    Review of Systems:  Review of Systems  Constitutional:  Positive for activity change. Negative for appetite change.  HENT: Negative.    Respiratory:  Negative for cough and shortness of breath.   Cardiovascular:  Negative for leg swelling.  Gastrointestinal:  Negative for constipation.  Genitourinary: Negative.   Musculoskeletal:  Negative for arthralgias, gait problem and myalgias.  Skin: Negative.   Neurological:  Positive for weakness. Negative for dizziness.  Psychiatric/Behavioral:  Negative for confusion, dysphoric mood and sleep disturbance.    Health Maintenance  Topic Date Due   Hepatitis C Screening  Never done   COVID-19 Vaccine (4 - Booster) 01/20/2021   COLONOSCOPY (Pts 45-27yrs Insurance coverage will need to be confirmed)  02/03/2026 (Originally 07/11/2020)   TETANUS/TDAP  05/30/2028   Pneumonia Vaccine 81+ Years old  Completed   INFLUENZA VACCINE  Completed   DEXA SCAN  Completed   Zoster Vaccines- Shingrix  Completed   HPV VACCINES  Aged Out    Physical Exam: Vitals:   03/31/21 1409  BP: 131/80  Pulse: (!) 58  Temp: (!) 96.2 F (35.7 C)  SpO2: 100%  Weight: 155 lb 12.8 oz (70.7 kg)  Height: 5\' 4"  (1.626 m)   Body mass index is 26.74 kg/m. Physical Exam Vitals  reviewed.  Constitutional:      Appearance: Normal appearance.  HENT:     Head: Normocephalic.     Right Ear: Tympanic membrane normal.     Left Ear: Tympanic membrane normal.     Ears:     Comments: Wax in Both ears    Nose: Nose normal.     Mouth/Throat:     Mouth: Mucous membranes are moist.     Pharynx: Oropharynx is clear.  Eyes:     Pupils: Pupils are equal, round, and reactive to light.  Cardiovascular:     Rate and Rhythm: Normal rate and regular rhythm.     Pulses: Normal pulses.     Heart sounds: Normal heart sounds. No murmur heard. Pulmonary:     Effort: Pulmonary effort is normal.     Breath sounds: Normal breath  sounds.  Abdominal:     General: Abdomen is flat. Bowel sounds are normal.     Palpations: Abdomen is soft.  Musculoskeletal:        General: No swelling.     Cervical back: Neck supple.  Skin:    General: Skin is warm.  Neurological:     General: No focal deficit present.     Mental Status: She is alert and oriented to person, place, and time.  Psychiatric:        Mood and Affect: Mood normal.        Thought Content: Thought content normal.    Labs reviewed: Basic Metabolic Panel: Recent Labs    07/23/20 0815 08/31/20 2133 09/02/20 0249 09/08/20 0839 02/08/21 1340 03/25/21 0810  NA 140   < > 138 137  --  139  K 4.3   < > 3.2* 4.5  --  4.2  CL 105   < > 108 102  --  107  CO2 24   < > 23 23  --  25  GLUCOSE 82   < > 89 81  --  97  BUN 18   < > 6* 18  --  15  CREATININE 0.55*   < > 0.50 0.69  --  0.58*  CALCIUM 8.9   < > 8.2* 9.3  --  8.7  TSH 1.08  --   --   --  0.15*  --    < > = values in this interval not displayed.   Liver Function Tests: Recent Labs    08/31/20 2133 03/16/21 0920 03/25/21 0810  AST 21 14 27   ALT 19 20 27   ALKPHOS 53 59  --   BILITOT 0.6 0.4 0.5  PROT 6.7 6.1 6.1  ALBUMIN 4.0 4.2  --    No results for input(s): LIPASE, AMYLASE in the last 8760 hours. No results for input(s): AMMONIA in the last 8760  hours. CBC: Recent Labs    07/23/20 0815 08/31/20 2133 09/01/20 1849 09/02/20 0249 03/25/21 0810  WBC 9.1 10.2 14.2* 11.4* 6.1  NEUTROABS 5,870 7.1  --   --  3,264  HGB 13.8 13.8 12.9 12.5 13.1  HCT 40.4 42.3 37.6 37.0 39.0  MCV 97.8 99.8 97.4 97.1 96.3  PLT 290 280 246 217 221   Lipid Panel: Recent Labs    07/23/20 0815 11/26/20 0810 03/16/21 0920  CHOL 267* 143 149  HDL 56 49* 56  LDLCALC 185* 74 70  TRIG 126 116 132  CHOLHDL 4.8 2.9 2.7   Lab Results  Component Value Date   HGBA1C 5.7 (H) 09/01/2020    Procedures since last visit: No results found.  Assessment/Plan Acquired hypothyroidism Synthyroid dose changed recently US was negative for any Nodule Will repeat TSH in 8 weeks  Balance problem Working with therapy and doing well  Other fatigue Continues to be her issue  NSTEMI (non-ST elevated myocardial infarction) ([place]) DPAT with Aspirin and Brinlinta for 1 year On statin and Beta Blocker No Diuretics anymore Just finished Cardiac Rehab Mixed hyperlipidemia Crestor lower dose due to [place] Senile osteoporosis Want to change Fosamax to prolia Have talked to [facility] DEXA showed T score  Right Femur -2.6 L1 is -2.4 No change since last DEXA in 2019  Mild cognitive impairment MMSE  is 25/30  Recall is good. Passed her Clock drawing Doing well  Vitamin B 12 deficiency On Supplement Umbilical hernia without obstruction and without gangrene No surgery Iron deficiency Hgb  Normal But says Iron helps with Fatigue  Refuses Mammogram No Family history of Breast cancer  Labs/tests ordered:  * No order type specified * Next appt:  Visit date not found

## 2021-03-31 NOTE — Therapy (Signed)
[facility] [address] [place], [place], [address] Phone: [phone]   Fax:  [phone]  Physical Therapy Treatment  Patient Details  Name: Jessica [name] MRN: [id number] Date of Birth: [birth year] Referring Provider (PT): Virgie Dad, MD   Encounter Date: 03/31/2021   PT End of Session - 03/31/21 0937     Visit Number 6    Number of Visits 7    Date for PT Re-Evaluation 04/09/21    Authorization Type Medicare A+B/AARP Supplement (10th Visit PN)    Progress Note Due on Visit 10    PT Start Time 0935    PT Stop Time 1013    PT Time Calculation (min) 38 min    Activity Tolerance Patient tolerated treatment well    Behavior During Therapy [facility] for tasks assessed/performed             Past Medical History:  Diagnosis Date   Anemia 12/2015   Bronchitis, chronic ([place])    in her 62s   Cataract    Chronic kidney disease    in 20s had nephritis   Closed fracture of right distal humerus    right   Coronary artery disease    Hearing loss 05/03/2016   High cholesterol    Hypothyroidism    Osteopenia    Pericarditis 01/1999   Pneumothorax 01/1999   Thyroid disorder    Vitamin B 12 deficiency     Past Surgical History:  Procedure Laterality Date   birthmark      removed,left leg   CARDIAC CATHETERIZATION     CATARACT EXTRACTION  2017   CORONARY STENT INTERVENTION N/A 09/01/2020   Procedure: CORONARY STENT INTERVENTION;  Surgeon: Belva Crome, MD;  Location: [facility];  Service: Cardiovascular;  Laterality: N/A;   LEFT HEART CATH AND CORONARY ANGIOGRAPHY N/A 09/01/2020   Procedure: LEFT HEART CATH AND CORONARY ANGIOGRAPHY;  Surgeon: Belva Crome, MD;  Location: [facility];  Service: Cardiovascular;  Laterality: N/A;   ORIF HUMERUS FRACTURE Right 04/10/2019   Procedure: RIGHT OPEN REDUCTION INTERNAL FIXATION (ORIF) DISTAL HUMERUS FRACTURE WITH EXTENSION;  Surgeon: Hiram Gash,  MD;  Location: [facility];  Service: Orthopedics;  Laterality: Right;   [place]?    There were no vitals filed for this visit.   Subjective Assessment - 03/31/21 0937     Subjective Pt reports she is doing ok. No falls or changes.    Pertinent History Anemia, Bronchitis, CKD, Hx of R Humerus Fx, CAD, Hearing Loss, Osteopenia, Vitamin B12 Deficiency    Limitations House hold activities;Walking    Patient Stated Goals Restore balance    Currently in Pain? Yes    Pain Score 2     Pain Location Hip    Pain Orientation Left    Pain Descriptors / Indicators Aching;Sore    Pain Type Acute pain    Pain Onset 1 to 4 weeks ago    Pain Frequency Intermittent    Pain Onset In the past 7 days                               [facility] Adult PT Treatment/Exercise - 03/31/21 2706       Ambulation/Gait   Ambulation/Gait Yes    Ambulation/Gait Assistance 5: Supervision    Ambulation/Gait Assistance Details around in clinic between activities.  Assistive device None    Gait Pattern Step-through pattern    Ambulation Surface Level;Indoor      Neuro Re-ed    Neuro Re-ed Details  In // bars: on rockerboard positioned ant/post eyes open maintaining level x 30 sec, head nods up/down 10 x 2, eyes closed 30 sec x 2, raising 3.3# med ball to shoulder height 10 x 2, alternating tapping cone x 20 with 1 UE fingertip support. CGA for all activities with increased sway with eyes closed and dynamic activities. Then turned board lateral: maintaining level 30 sec eyes open, looking side to side 10 x 2, eyes closed 30 sec x 2, trunk rotation with 3.3# med ball x 10 to each side maintaining board level, rocking board side to side x 20 working on control. Dynamic gait activities in the hallway: gait with head turns left/right 40' x 4 with verbal cues to relax arms to get some arm swing. Initially had some veering but did improve as went on. Backwards gait  40' x 2 with cues to increase step length. CGA/close SBA for safety. Reciprocal stepping with high march over floor ladder x 4 bouts, reciprocal stepping with tapping cone and stepping over every other rung with double support in between CGA/min assist x 4 bouts.                     PT Education - 03/31/21 1021     Education Details Discussed reassessment next visit to determine d/c versus recert by primary PT    Person(s) Educated Patient    Methods Explanation    Comprehension Verbalized understanding              PT Short Term Goals - 03/17/21 1021       PT SHORT TERM GOAL #1   Title Patient will be independent with initial HEP for balance (ALL STGs Due: 03/05/21)    Baseline no HEP provided; reports independence with current HEP    Time 3    Period Weeks    Status New    Target Date 03/05/21      PT SHORT TERM GOAL #2   Title Patient will be able to ascend/descend a curb without AD and no LOB for improved safety with community mobility    Baseline TBA; ascend/descend single curb no AD, supervision    Time 3    Period Weeks    Status Achieved               PT Long Term Goals - 02/12/21 1028       PT LONG TERM GOAL #1   Title Patient will be independent with final HEP for balance (ALL LTGs Due: 04/02/21)    Baseline no HEP established    Time 6    Period Weeks    Status New    Target Date 04/02/21      PT LONG TERM GOAL #2   Title Patient will improve FGA to >/= 25/30 to demo improved balance and reduced fall risk    Baseline 22/30    Time 6    Period Weeks    Status New      PT LONG TERM GOAL #3   Title Patient will be able to hold situation 4 of M-CTSIB for >/= 25 seconds    Baseline 18 seconds    Time 6    Period Weeks    Status New      PT LONG TERM GOAL #4  Title Patient will be demo ability to ambulate >/= 500 ft outdoors on unelvel  surfaces with supervision and no AD    Baseline TBA    Time 6    Period Weeks    Status New       PT LONG TERM GOAL #5   Title Patient will improve 5x sit <> stand to </= 12 seconds to demo improved balance/strength    Baseline 17.6 secs without UE support    Time 6    Period Weeks    Status New                   Plan - 03/31/21 1022     Clinical Impression Statement PT continued to work on weight shifting, increasing SLS time with dynamic activities and balance with vision removed. Pt is challenged with these but did improve with practice.    Personal Factors and Comorbidities Comorbidity 3+;Time since onset of injury/illness/exacerbation    Comorbidities Anemia, Bronchitis, CKD, Hx of R Humerus Fx, CAD, Hearing Loss, Osteopenia, Vitamin B12 Deficiency    Examination-Activity Limitations Stairs;Reach Overhead;Locomotion Level    Examination-Participation Restrictions Community Activity;Interpersonal Relationship    Stability/Clinical Decision Making Stable/Uncomplicated    Rehab Potential Good    PT Frequency 1x / week    PT Duration 6 weeks   plus eval   PT Treatment/Interventions ADLs/Self Care Home Management;Aquatic Therapy;Moist Heat;Cryotherapy;DME Instruction;Gait training;Stair training;Functional mobility training;Therapeutic activities;Neuromuscular re-education;Balance training;Therapeutic exercise;Patient/family education;Vestibular;Manual techniques;Passive range of motion    PT Next Visit Plan Check goals to determine d/c versus recert next visit. continue focus on dynamic gait, vision removed on complaint surfaces, gait with head turns. SLS activities. review and provided progressive HEP.    Consulted and Agree with Plan of Care Patient             Patient will benefit from skilled therapeutic intervention in order to improve the following deficits and impairments:  Decreased balance, Pain, Decreased strength, Decreased activity tolerance, Decreased endurance, Difficulty walking  Visit Diagnosis: Other abnormalities of gait and  mobility  Unsteadiness on feet  Muscle weakness (generalized)     Problem List Patient Active Problem List   Diagnosis Date Noted   NSTEMI (non-ST elevated myocardial infarction) ([place]) 09/01/2020   Closed fracture of right distal humerus 04/10/2019   Dyspnea 05/09/2018   Vitamin B 12 deficiency 05/09/2018   Cough 03/06/2018   Senile osteoporosis 11/20/2017   Iron deficiency 76/19/5093   Lichen sclerosus 26/71/2458   External hemorrhoid 04/26/2017   Weakness of left leg 06/07/2016   Balance disorder 05/24/2016   Memory deficit 05/24/2016   Anemia 05/19/2016   Hearing loss 05/03/2016   Hypothyroidism 07/24/2013   Bradycardia on ECG 07/24/2013   Numbness 08/07/2012   HYPERLIPIDEMIA 12/29/2006    Electa Sniff, PT, DPT, NCS 03/31/2021, 10:23 AM  [facility] [address] [place], [place], [address] Phone: [phone]   Fax:  [phone]  Name: Jessica [name] MRN: [id number] Date of Birth: 11-26-1940

## 2021-04-02 ENCOUNTER — Telehealth: Payer: Self-pay

## 2021-04-02 NOTE — Telephone Encounter (Signed)
Called patient to schedule [facility] screening visit. No answer and message left asking for a return call

## 2021-04-06 DIAGNOSIS — H6123 Impacted cerumen, bilateral: Secondary | ICD-10-CM | POA: Diagnosis not present

## 2021-04-07 ENCOUNTER — Other Ambulatory Visit: Payer: Self-pay

## 2021-04-07 ENCOUNTER — Ambulatory Visit: Payer: Medicare Other | Attending: Internal Medicine

## 2021-04-07 DIAGNOSIS — M6281 Muscle weakness (generalized): Secondary | ICD-10-CM | POA: Diagnosis not present

## 2021-04-07 DIAGNOSIS — R2681 Unsteadiness on feet: Secondary | ICD-10-CM | POA: Insufficient documentation

## 2021-04-07 DIAGNOSIS — R2689 Other abnormalities of gait and mobility: Secondary | ICD-10-CM | POA: Diagnosis not present

## 2021-04-07 NOTE — Therapy (Signed)
[facility] [address] [place], [place], [address] Phone: [phone]   Fax:  [phone]  Physical Therapy Treatment/Discharge Summary  Patient Details  Name: Jessica [name] MRN: [id number] Date of Birth: [birth year] Referring Provider (PT): Virgie Dad, MD  PHYSICAL THERAPY DISCHARGE SUMMARY  Visits from Start of Care: 7  Current functional level related to goals / functional outcomes: See Clinical Impression Statement   Remaining deficits: None   Education / Equipment: HEP Provided   Patient agrees to discharge. Patient goals were met. Patient is being discharged due to meeting the stated rehab goals.   Encounter Date: 04/07/2021   PT End of Session - 04/07/21 0934     Visit Number 7    Number of Visits 7    Date for PT Re-Evaluation 04/09/21    Authorization Type Medicare A+B/AARP Supplement (10th Visit PN)    Progress Note Due on Visit 10    PT Start Time 0931    PT Stop Time 1010    PT Time Calculation (min) 39 min    Activity Tolerance Patient tolerated treatment well    Behavior During Therapy [facility] for tasks assessed/performed             Past Medical History:  Diagnosis Date   Anemia 12/2015   Bronchitis, chronic ([facility])    in her 11s   Cataract    Chronic kidney disease    in 20s had nephritis   Closed fracture of right distal humerus    right   Coronary artery disease    Hearing loss 05/03/2016   High cholesterol    Hypothyroidism    Osteopenia    Pericarditis 01/1999   Pneumothorax 01/1999   Thyroid disorder    Vitamin B 12 deficiency     Past Surgical History:  Procedure Laterality Date   birthmark      removed,left leg   CARDIAC CATHETERIZATION     CATARACT EXTRACTION  2017   CORONARY STENT INTERVENTION N/A 09/01/2020   Procedure: CORONARY STENT INTERVENTION;  Surgeon: Belva Crome, MD;  Location: [facility];  Service: Cardiovascular;  Laterality: N/A;    LEFT HEART CATH AND CORONARY ANGIOGRAPHY N/A 09/01/2020   Procedure: LEFT HEART CATH AND CORONARY ANGIOGRAPHY;  Surgeon: Belva Crome, MD;  Location: [facility];  Service: Cardiovascular;  Laterality: N/A;   ORIF HUMERUS FRACTURE Right 04/10/2019   Procedure: RIGHT OPEN REDUCTION INTERNAL FIXATION (ORIF) DISTAL HUMERUS FRACTURE WITH EXTENSION;  Surgeon: Hiram Gash, MD;  Location: [facility];  Service: Orthopedics;  Laterality: Right;   [place]?    There were no vitals filed for this visit.   Subjective Assessment - 04/07/21 0933     Subjective Patient reports no new changes or falls to report.    Pertinent History Anemia, Bronchitis, CKD, Hx of R Humerus Fx, CAD, Hearing Loss, Osteopenia, Vitamin B12 Deficiency    Limitations House hold activities;Walking    Patient Stated Goals Restore balance    Currently in Pain? No/denies    Pain Onset 1 to 4 weeks ago    Pain Onset In the past 7 days                [facility] PT Assessment - 04/07/21 0001       Assessment   Medical Diagnosis Falls/Imbalance    Referring Provider (PT) Virgie Dad, MD    Onset Date/Surgical Date 01/19/21  High Level Balance   High Level Balance Comments Completed M-CTSIB: situation 1-4: full 30 seconds      Functional Gait  Assessment   Gait assessed  Yes    Gait Level Surface Walks 20 ft in less than 5.5 sec, no assistive devices, good speed, no evidence for imbalance, normal gait pattern, deviates no more than 6 in outside of the 12 in walkway width.   5.05 seconds   Change in Gait Speed Able to smoothly change walking speed without loss of balance or gait deviation. Deviate no more than 6 in outside of the 12 in walkway width.    Gait with Horizontal Head Turns Performs head turns smoothly with no change in gait. Deviates no more than 6 in outside 12 in walkway width    Gait with Vertical Head Turns Performs head turns with no change in  gait. Deviates no more than 6 in outside 12 in walkway width.    Gait and Pivot Turn Pivot turns safely within 3 sec and stops quickly with no loss of balance.    Step Over Obstacle Is able to step over one shoe box (4.5 in total height) without changing gait speed. No evidence of imbalance.    Gait with Narrow Base of Support Ambulates 7-9 steps.    Gait with Eyes Closed Walks 20 ft, uses assistive device, slower speed, mild gait deviations, deviates 6-10 in outside 12 in walkway width. Ambulates 20 ft in less than 9 sec but greater than 7 sec.    Ambulating Backwards Walks 20 ft, no assistive devices, good speed, no evidence for imbalance, normal gait    Steps Alternating feet, must use rail.    Total Score 26    FGA comment: 26/30              [facility] Adult PT Treatment/Exercise - 04/07/21 0001       Transfers   Transfers Sit to Stand;Stand to Sit    Sit to Stand 5: Supervision    Five time sit to stand comments  10.50 secs with UE support    Stand to Sit 5: Supervision             Reviewed HEP with patient, denies any questions/concerns at this time:   Access Code: [email or web address] URL: [email or web address] Date: 03/17/2021 Prepared by: Baldomero Lamy   Exercises Supine Lower Trunk Rotation - 1 x daily - 7 x weekly - 1 sets - 10 reps Hooklying Single Knee to Chest Stretch - 1 x daily - 7 x weekly - 2 sets - 5 reps - 5 seconds hold Hooklying Hamstring Stretch with Strap - 1 x daily - 7 x weekly - 2 sets - 5 reps - 10 second hold Supine ITB Stretch with Strap - 1 x daily - 7 x weekly - 2 sets - 3 reps - 5 second hold Seated Piriformis Stretch (Mirrored) - 1 x daily - 7 x weekly - 2 sets - 30 second hold Side stretch, reaching to the RIGHT - 1 x daily - 7 x weekly - 3 sets - 10 reps Standing Single Leg Stance with Counter Support - 1 x daily - 7 x weekly - 2 sets - 3 reps Romberg Stance on Foam Pad with Head Rotation - 1 x daily - 7 x weekly - 2 sets - 10  reps Romberg Stance with Head Nods on Foam Pad - 1 x daily - 7 x weekly - 2 sets - 10 reps Romberg Stance Eyes  Closed on Foam Pad - 1 x daily - 7 x weekly - 1 sets - 3 reps - 30 seconds hold Standing Toe Taps - 1 x daily - 7 x weekly - 1 sets - 10 reps    PT Education - 04/07/21 1013     Education Details progress toward LTGs; d/c to meeting LTG; potential to follow up if experience decline in 5-6 months    Person(s) Educated Patient    Methods Explanation    Comprehension Verbalized understanding              PT Short Term Goals - 03/17/21 1021       PT SHORT TERM GOAL #1   Title Patient will be independent with initial HEP for balance (ALL STGs Due: 03/05/21)    Baseline no HEP provided; reports independence with current HEP    Time 3    Period Weeks    Status New    Target Date 03/05/21      PT SHORT TERM GOAL #2   Title Patient will be able to ascend/descend a curb without AD and no LOB for improved safety with community mobility    Baseline TBA; ascend/descend single curb no AD, supervision    Time 3    Period Weeks    Status Achieved               PT Long Term Goals - 04/07/21 4503       PT LONG TERM GOAL #1   Title Patient will be independent with final HEP for balance (ALL LTGs Due: 04/02/21)    Baseline no HEP established; reports independence with HEP    Time 6    Period Weeks    Status Achieved    Target Date 04/02/21      PT LONG TERM GOAL #2   Title Patient will improve FGA to >/= 25/30 to demo improved balance and reduced fall risk    Baseline 22/30; 26/30    Time 6    Period Weeks    Status Achieved      PT LONG TERM GOAL #3   Title Patient will be able to hold situation 4 of M-CTSIB for >/= 25 seconds    Baseline 18 seconds; 30 seconds    Time 6    Period Weeks    Status Achieved      PT LONG TERM GOAL #4   Title Patient will be demo ability to ambulate >/= 500 ft outdoors on unelvel  surfaces with supervision and no AD    Baseline  TBA; 575 ft indoors simulated unlevel surfaces no imbalance Mod I    Time 6    Period Weeks    Status Achieved      PT LONG TERM GOAL #5   Title Patient will improve 5x sit <> stand to </= 12 seconds to demo improved balance/strength    Baseline 17.6 secs with UE support; 10.50 secs with UE support    Time 6    Period Weeks    Status Achieved                Plan - 04/07/21 1014     Clinical Impression Statement Completed assesment of patient's progress toward LTGs. Patient able to meet all LTGs today, demonstrating improved balance and reduced fall risk. Patient scored 26/30 on FGA, able to hold all situations of M-CTSIB for full 30 seconds, and improved 5x sit <> stand to 10.5 seconds. Patient reports independence  and compliance with HEP, feels comfortable with continue activity outside of PT session. Therefore patient demo readiness to d/c from PT services at this time. Patient verbalize agreement.    Personal Factors and Comorbidities Comorbidity 3+;Time since onset of injury/illness/exacerbation    Comorbidities Anemia, Bronchitis, CKD, Hx of R Humerus Fx, CAD, Hearing Loss, Osteopenia, Vitamin B12 Deficiency    Examination-Activity Limitations Stairs;Reach Overhead;Locomotion Level    Examination-Participation Restrictions Community Activity;Interpersonal Relationship    Stability/Clinical Decision Making Stable/Uncomplicated    Rehab Potential Good    PT Frequency 1x / week    PT Duration 6 weeks   plus eval   PT Treatment/Interventions ADLs/Self Care Home Management;Aquatic Therapy;Moist Heat;Cryotherapy;DME Instruction;Gait training;Stair training;Functional mobility training;Therapeutic activities;Neuromuscular re-education;Balance training;Therapeutic exercise;Patient/family education;Vestibular;Manual techniques;Passive range of motion    PT Next Visit Plan d/c this visit    Consulted and Agree with Plan of Care Patient             Patient will benefit from skilled  therapeutic intervention in order to improve the following deficits and impairments:  Decreased balance, Pain, Decreased strength, Decreased activity tolerance, Decreased endurance, Difficulty walking  Visit Diagnosis: Other abnormalities of gait and mobility  Unsteadiness on feet  Muscle weakness (generalized)     Problem List Patient Active Problem List   Diagnosis Date Noted   NSTEMI (non-ST elevated myocardial infarction) ([place]) 09/01/2020   Closed fracture of right distal humerus 04/10/2019   Dyspnea 05/09/2018   Vitamin B 12 deficiency 05/09/2018   Cough 03/06/2018   Senile osteoporosis 11/20/2017   Iron deficiency 81/44/8185   Lichen sclerosus 63/14/9702   External hemorrhoid 04/26/2017   Weakness of left leg 06/07/2016   Balance disorder 05/24/2016   Memory deficit 05/24/2016   Anemia 05/19/2016   Hearing loss 05/03/2016   Hypothyroidism 07/24/2013   Bradycardia on ECG 07/24/2013   Numbness 08/07/2012   HYPERLIPIDEMIA 12/29/2006    Jones Bales, PT, DPT 04/07/2021, 10:19 AM  [facility] [address] [place], [place], [address] Phone: [phone]   Fax:  [phone]  Name: Jessica [name] MRN: [id number] Date of Birth: [birth year]

## 2021-04-13 ENCOUNTER — Encounter: Payer: Self-pay | Admitting: Internal Medicine

## 2021-04-14 ENCOUNTER — Other Ambulatory Visit: Payer: Self-pay | Admitting: Internal Medicine

## 2021-04-14 ENCOUNTER — Encounter: Payer: Self-pay | Admitting: Internal Medicine

## 2021-04-14 MED ORDER — METHOCARBAMOL 500 MG PO TABS: 250.0000 mg | ORAL_TABLET | Freq: Two times a day (BID) | ORAL | 1 refills | Status: DC | PRN

## 2021-04-19 ENCOUNTER — Other Ambulatory Visit: Payer: Self-pay

## 2021-04-19 VITALS — BP 127/67 | HR 57 | Ht 63.0 in | Wt 154.4 lb

## 2021-04-19 DIAGNOSIS — Z006 Encounter for examination for normal comparison and control in clinical research program: Secondary | ICD-10-CM

## 2021-04-19 NOTE — Research (Signed)
[name] Informed Consent   Subject Name: Jessica [name]  Subject met inclusion and exclusion criteria.  The informed consent form, study requirements and expectations were reviewed with the subject and questions and concerns were addressed prior to the signing of the consent form.  The subject verbalized understanding of the trial requirements.  The subject agreed to participate in the [facility] trial and signed the informed consent at [place] on 04/19/2021.  The informed consent was obtained prior to performance of any protocol-specific procedures for the subject.  A copy of the signed informed consent was given to the subject and a copy was placed in the subject's medical record.   [place]  [place] Consent Version 2 Protocol Version 1 amendment 1

## 2021-04-19 NOTE — Research (Signed)
Jessica [name] DEMOGRAPHICS [id number]      SITE [id number] SUBJECT ID: [id number]     DATE: 04/19/2021      []  FEMALE                            [x]  FEMALE AGE: 81 ETHINICITY:   []  HISPANIC/LATINO      [x]  NON- HISPANIC/LATINO RACE:           [x]   WHITE             []  BLACK/AFRICAN AMERICAN                         []   ASIAN              []  AMERICAN INDIAN/[place] NATIVE                         []   NATIVE [place]/OTHER PACIFIC ISLANDER                         []   OTHER  FUTURE RESEARCH [x]  USE OF SAMPLES FOR FUTURE RESEARCH [x]  PHARMACOGENTIC (GENETIC) RESEARCH       [facility] Screening Visit [id number] SUBJECT ID: [id number]     DATE:  04/19/2021                               The following was completed during visit: [x]  CONSENT SIGNED [x]  INCLUSION/EXCLUSION MET [x]  MEASUREMENTS TAKEN HEIGHT:   5'3"               WEIGHT:154.4lbs WAIST CIRMCUMFERENCE: 103 cm  HIP CIRCUMFERENCE:106 cm B/P:  127/67                          HR: 57 []  SERUM PREGNANCY TEST/FSH-NOT APPLICABLE [x]  FASTING GLUCOSE/HbA1c [x]  FASTING LIPID PANELY [x]  LPa [x]  CHEMISTRY/HEMATOLOGY [x]  MEDICATIONS REVIEWED [x]  DEMOGRAPHICS

## 2021-04-21 ENCOUNTER — Ambulatory Visit (INDEPENDENT_AMBULATORY_CARE_PROVIDER_SITE_OTHER): Payer: Medicare Other

## 2021-04-21 ENCOUNTER — Other Ambulatory Visit: Payer: Self-pay

## 2021-04-21 DIAGNOSIS — M810 Age-related osteoporosis without current pathological fracture: Secondary | ICD-10-CM

## 2021-04-21 MED ORDER — DENOSUMAB 60 MG/ML ~~LOC~~ SOSY
60.0000 mg | PREFILLED_SYRINGE | Freq: Once | SUBCUTANEOUS | Status: AC
  Administered 2021-04-21: 60 mg via SUBCUTANEOUS

## 2021-04-22 NOTE — Research (Addendum)
° ° ° ° ° ° °  ABNORMAL LABS: TEST RESULT  Glucose 103            Are these clinically significant? []  YES              [x]  NO  Lpa to follow at a later date  Pixie Casino, MD, FACC, [place] Director of the Advanced Lipid Disorders &  Cardiovascular Risk Reduction Clinic Diplomate of the American Board of Clinical Lipidology Attending Cardiologist  Direct Dial: [phone]   Fax: [phone]  Website:  [email or web address]

## 2021-04-23 NOTE — Research (Addendum)
°  ABNORMAL LABS: TEST RESULT  LDL 83  CHOLESTEROL 169         Are these clinically significant? []  YES              [x]  NO   Pixie Casino, MD, [facility], [place] Director of the Advanced Lipid Disorders &  Cardiovascular Risk Reduction Clinic Diplomate of the American Board of Clinical Lipidology Attending Cardiologist  Direct Dial: [phone]   Fax: [phone]  Website:  [email or web address]

## 2021-04-28 ENCOUNTER — Other Ambulatory Visit: Payer: Self-pay | Admitting: Adult Health

## 2021-04-28 ENCOUNTER — Encounter: Payer: Medicare Other | Admitting: Internal Medicine

## 2021-04-29 ENCOUNTER — Telehealth: Payer: Self-pay

## 2021-04-29 NOTE — Telephone Encounter (Signed)
Called pt and she complains of breathlessness, and decrease stamina. Pt stats "I'm not sure if I need to be seen or not. It has increased over the past few weeks" Pt able to speak in complete sentences on the phone. Will get message over to provider to determine if she needs to be seen.

## 2021-04-29 NOTE — Telephone Encounter (Signed)
Spoke with patient, see chart.    

## 2021-04-30 NOTE — Telephone Encounter (Signed)
Called pt to follow up per Ermalinda Barrios, PA-C. See message below. Left message for pt to return the call to the [address] office since she is seen at the clinic.     Jessica Burn, PA-C  [place], [place], RN She had decreased stamina and breathlessness with going uphill when I saw her in 11/2020. Her EF improved 50-55% on echo. Does she have and swelling or weight gain? She could try taking a lasix if so. You can make her an appointment if she's concerned. No way to assess her without. thanks

## 2021-04-30 NOTE — Research (Addendum)
°  ABNORMAL LABS:  Are these clinically significant? []  YES              [x]  NO   Pixie Casino, MD, [facility], [place] Director of the Advanced Lipid Disorders &  Cardiovascular Risk Reduction Clinic Diplomate of the American Board of Clinical Lipidology Attending Cardiologist  Direct Dial: [phone]   Fax: [phone]  Website:  [email or web address]

## 2021-04-30 NOTE — Telephone Encounter (Signed)
Patient calling back.   °

## 2021-05-02 NOTE — Telephone Encounter (Signed)
Agree with Selinda Eon. She should be seen. May need cardiac stress imaging. May need to consider medication adjustment. Requires clinical assessment to determine treatment plan.   thx

## 2021-05-06 ENCOUNTER — Ambulatory Visit (INDEPENDENT_AMBULATORY_CARE_PROVIDER_SITE_OTHER): Payer: Medicare Other | Admitting: Cardiovascular Disease

## 2021-05-06 ENCOUNTER — Encounter: Payer: Self-pay | Admitting: Cardiovascular Disease

## 2021-05-06 ENCOUNTER — Other Ambulatory Visit: Payer: Self-pay

## 2021-05-06 VITALS — BP 128/78 | HR 61 | Ht 63.0 in | Wt 154.6 lb

## 2021-05-06 DIAGNOSIS — E782 Mixed hyperlipidemia: Secondary | ICD-10-CM | POA: Diagnosis not present

## 2021-05-06 DIAGNOSIS — I2510 Atherosclerotic heart disease of native coronary artery without angina pectoris: Secondary | ICD-10-CM

## 2021-05-06 DIAGNOSIS — R0609 Other forms of dyspnea: Secondary | ICD-10-CM | POA: Diagnosis not present

## 2021-05-06 MED ORDER — CLOPIDOGREL BISULFATE 75 MG PO TABS: 75.0000 mg | ORAL_TABLET | Freq: Every day | ORAL | 3 refills | Status: DC

## 2021-05-06 NOTE — Progress Notes (Signed)
Cardiology Office Note:    Date:  05/06/2021   ID:  Jessica [name], DOB December 07, 1940, MRN [id number]  PCP:  Virgie Dad, MD   [facility] Providers Cardiologist:  Sherren Mocha, MD     Referring MD: Virgie Dad, MD   Chief Complaint  Patient presents with   Coronary Artery Disease   Shortness of Breath    History of Present Illness:    Jessica [name] is a 81 y.o. female with a hx of coronary artery disease, presenting for follow-up evaluation.  Patient has a remote history of pericarditis in 2000.  Other medical problems include hyperlipidemia and hypothyroidism.  She presented with a non-ST elevation infarction in June 2022 and was found to have subtotal occlusion of the LAD treated with a drug-eluting stent.  She initially had an LVEF in the 40 to 45% range but has improved to an LVEF of 50 to 55% on follow-up echo assessment.  The patient is here alone.  She did well with cardiac rehab after her MRI.  However, over recent weeks she has become more short of breath with activity.  She has not been exercising as much because she was when she was actively involved in phase 2 cardiac rehab.  She complains of shortness of breath with bending forward and with any physical exertion.  She denies chest pain or pressure.  She denies orthopnea, PND, edema, or heart palpitations.  She denies lightheadedness or syncope.  She notes that she has been under a lot of family stress lately.  Past Medical History:  Diagnosis Date   Anemia 12/2015   Bronchitis, chronic (HCC)    in her 56s   Cataract    Chronic kidney disease    in 20s had nephritis   Closed fracture of right distal humerus    right   Coronary artery disease    Hearing loss 05/03/2016   High cholesterol    Hypothyroidism    Osteopenia    Pericarditis 01/1999   Pneumothorax 01/1999   Thyroid disorder    Vitamin B 12 deficiency     Past Surgical History:  Procedure Laterality Date   birthmark       removed,left leg   CARDIAC CATHETERIZATION     CATARACT EXTRACTION  2017   CORONARY STENT INTERVENTION N/A 09/01/2020   Procedure: CORONARY STENT INTERVENTION;  Surgeon: Belva Crome, MD;  Location: [facility] [facility];  Service: Cardiovascular;  Laterality: N/A;   LEFT HEART CATH AND CORONARY ANGIOGRAPHY N/A 09/01/2020   Procedure: LEFT HEART CATH AND CORONARY ANGIOGRAPHY;  Surgeon: Belva Crome, MD;  Location: [facility];  Service: Cardiovascular;  Laterality: N/A;   ORIF HUMERUS FRACTURE Right 04/10/2019   Procedure: RIGHT OPEN REDUCTION INTERNAL FIXATION (ORIF) DISTAL HUMERUS FRACTURE WITH EXTENSION;  Surgeon: Hiram Gash, MD;  Location: [facility];  Service: Orthopedics;  Laterality: Right;   [place]?    Current Medications: Current Meds  Medication Sig   acetaminophen (TYLENOL) 650 MG CR tablet Take 1,300 mg by mouth every 8 (eight) hours as needed for pain.   aspirin 81 MG chewable tablet Chew 1 tablet (81 mg total) by mouth daily.   Calcium Carb-Cholecalciferol (CALCIUM 600+D) 600-800 MG-UNIT TABS Take 2 tablets by mouth daily.   cholecalciferol (VITAMIN D) 1000 UNITS tablet Take 2,000 Units by mouth daily.    clopidogrel (PLAVIX) 75 MG tablet Take 1 tablet (75 mg total) by mouth daily.  denosumab (PROLIA) 60 MG/ML SOSY injection Inject 60 mg into the skin every 6 (six) months.   ferrous sulfate 325 (65 FE) MG tablet Take 325 mg by mouth daily with breakfast.   l-methylfolate-B6-B12 (METANX) 3-35-2 MG TABS tablet Take 1 tablet by mouth daily.   levothyroxine (SYNTHROID) 112 MCG tablet TAKE 1 TABLET BY MOUTH DAILY   methocarbamol (ROBAXIN) 500 MG tablet Take 0.5 tablets (250 mg total) by mouth 2 (two) times daily as needed for muscle spasms.   metoprolol succinate (TOPROL-XL) 25 MG 24 hr tablet TAKE 1/2 TABLET BY MOUTH DAILY (Patient taking differently: Take 12.5 mg by mouth daily.)   nitroGLYCERIN (NITROSTAT) 0.4 MG SL  tablet Place 1 tablet (0.4 mg total) under the tongue every 5 (five) minutes as needed for chest pain.   rosuvastatin (CRESTOR) 20 MG tablet TAKE 1 TABLET(20 MG) BY MOUTH DAILY   vitamin C (ASCORBIC ACID) 500 MG tablet Take 500 mg by mouth daily.   [DISCONTINUED] ticagrelor (BRILINTA) 90 MG TABS tablet Take 1 tablet (90 mg total) by mouth 2 (two) times daily.     Allergies:   Atorvastatin   Social History   Socioeconomic History   Marital status: Divorced    Spouse name: Not on file   Number of children: 1   Years of education: college   Highest education level: Not on file  Occupational History   Occupation: retired  Tobacco Use   Smoking status: Never   Smokeless tobacco: Never  Vaping Use   Vaping Use: Never used  Substance and Sexual Activity   Alcohol use: Yes    Alcohol/week: 3.0 standard drinks    Types: 3 Glasses of wine per week    Comment: social   Drug use: No   Sexual activity: Not on file  Other Topics Concern   Not on file  Social History Narrative   Moved to [facility] 01/14/2015   Do you drink/eat things with caffeine?  yes   Marital status?      divorced                               Do you live in a house, apartment, assisted living, condo, trailer, etc.?  Retirement community   Is it one or more stories? Yes, 3   How many persons live in your home?  1   Do you have any pets in your home? (please list) 0   Current or past profession:  Librarian   Do you exercise?     yes                                 Type & how often? Walk-varies   Do you have a living will? yes   Do you have a DNR form?         no                         If not, do you want to discuss one? yes   Do you have signed POA/HPOA for forms? yes   Social Determinants of Health   Financial Resource Strain: Not on file  Food Insecurity: Not on file  Transportation Needs: Not on file  Physical Activity: Not on file  Stress: Not on file  Social Connections: Not on file     Family  History: The patient's family history includes Arthritis in her paternal grandmother; Asthma in her mother; Ataxia in her father; Cancer - Cervical in her sister; Diabetes in her maternal grandmother, mother, and another family member; Stroke in her father; Thyroid disease in her sister. There is no history of Colon cancer.  ROS:   Please see the history of present illness.    All other systems reviewed and are negative.  EKGs/Labs/Other Studies Reviewed:    The following studies were reviewed today: Echo 10/07/20: 1. Distal septal hypokinesis with abnormal motion EF improved since echo  done 09/01/20. Left ventricular ejection fraction, by estimation, is 50 to  55%. The left ventricle has low normal function. The left ventricle has no  regional wall motion  abnormalities. Left ventricular diastolic parameters were normal.   2. Right ventricular systolic function is normal. The right ventricular  size is normal. There is normal pulmonary artery systolic pressure.   3. The mitral valve is normal in structure. Trivial mitral valve  regurgitation. No evidence of mitral stenosis.   4. The aortic valve is normal in structure. Aortic valve regurgitation is  not visualized. No aortic stenosis is present.   5. The inferior vena cava is normal in size with greater than 50%  respiratory variability, suggesting right atrial pressure of 3 mmHg.   Cardiac Cath 09/01/20: Dist LAD lesion is 75% stenosed.   Subtotal occlusion of the mid LAD with distal TIMI grade II flow.  Stenosis is a [place] 010 bifurcation stenosis with the first diagonal. Successful angioplasty and stenting using a 22 x 2.0 mm Onyx postdilated to 2.25 mm in diameter, avoiding jailing the larger first diagonal.  0% stenosis with TIMI grade III flow was noted. Tandem 50 and 40% proximal and mid RCA stenoses.  Right coronary is codominant. Left main is widely patent.   Circumflex is widely patent.  The circumflex and obtuse marginals  are tortuous. Apical severe hypokinesis/akinesis.  LVEDP 6 mmHg.     RECOMMENDATIONS:   Aspirin and Brilinta for 12 months. Aggressive risk factor modification High intensity statin therapy  EKG:  EKG is ordered today.  The ekg ordered today demonstrates normal sinus rhythm 61 bpm, within normal limits.  Recent Labs: 02/08/2021: TSH 0.15 03/25/2021: ALT 27; BUN 15; Creat 0.58; Hemoglobin 13.1; Platelets 221; Potassium 4.2; Sodium 139  Recent Lipid Panel    Component Value Date/Time   CHOL 149 03/16/2021 0920   TRIG 132 03/16/2021 0920   HDL 56 03/16/2021 0920   CHOLHDL 2.7 03/16/2021 0920   CHOLHDL 2.9 11/26/2020 0810   VLDL 18 02/24/2014 0926   LDLCALC 70 03/16/2021 0920   LDLCALC 74 11/26/2020 0810   LDLDIRECT 135.4 12/26/2006 0847     Risk Assessment/Calculations:           Physical Exam:    VS:  BP 128/78    Pulse 61    Ht 5\' 3"  (1.6 m)    Wt 154 lb 9.6 oz (70.1 kg)    SpO2 96%    BMI 27.39 kg/m     Wt Readings from Last 3 Encounters:  05/06/21 154 lb 9.6 oz (70.1 kg)  04/19/21 154 lb 6.4 oz (70 kg)  03/31/21 155 lb 12.8 oz (70.7 kg)     GEN:  Well nourished, well developed in no acute distress HEENT: Normal NECK: No JVD; No carotid bruits LYMPHATICS: No lymphadenopathy CARDIAC: RRR, no murmurs, rubs, gallops RESPIRATORY:  Clear to auscultation without rales, wheezing or rhonchi  ABDOMEN:  Soft, non-tender, non-distended MUSCULOSKELETAL:  No edema; No deformity  SKIN: Warm and dry NEUROLOGIC:  Alert and oriented x 3 PSYCHIATRIC:  Normal affect   ASSESSMENT:    1. Coronary artery disease involving native coronary artery of native heart without angina pectoris   2. Mixed hyperlipidemia   3. Dyspnea on exertion    PLAN:    In order of problems listed above:  Stable without symptoms of angina.  Seems unlikely that Brilinta would be significantly contributing to her shortness of breath, but now that she is greater than 6 months out, she would like to  change after we had a shared decision making discussion about the pros and cons of ticagrelor versus clopidogrel.  We will make that change and advised her to start clopidogrel 75 mg once daily after she discontinues Brilinta.  Otherwise continue current medical therapy. Treated with rosuvastatin 20 mg daily.  Last lipids reviewed with an LDL cholesterol of 70 mg/dL. Differential diagnosis includes primary lung disorder which seems unlikely in this non-smoker with clear lung fields on exam.  Consider ischemic cardiomyopathy/congestive heart failure.  Recommend check 2D echo.  Check BNP.  Check CBC to rule out anemia.  Physical exam is unrevealing today.  We will follow-up in 6 months.       Medication Adjustments/Labs and Tests Ordered: Current medicines are reviewed at length with the patient today.  Concerns regarding medicines are outlined above.  Orders Placed This Encounter  Procedures   CBC   Basic metabolic panel   Pro b natriuretic peptide (BNP)   EKG 12-Lead   ECHOCARDIOGRAM COMPLETE   Meds ordered this encounter  Medications   clopidogrel (PLAVIX) 75 MG tablet    Sig: Take 1 tablet (75 mg total) by mouth daily.    Dispense:  90 tablet    Refill:  3    Replaces brilinta    Patient Instructions  Medication Instructions:  STOP Brilinta START Plavix (Clopidogrel) 75mg  once daily *If you need a refill on your cardiac medications before your next appointment, please call your pharmacy*   Lab Work: CBC, BMET, BNP TODAY! If you have labs (blood work) drawn today and your tests are completely normal, you will receive your results only by: [place] (if you have MyChart) OR A paper copy in the mail If you have any lab test that is abnormal or we need to change your treatment, we will call you to review the results.   Testing/Procedures: ECHO Your physician has requested that you have an echocardiogram. Echocardiography is a painless test that uses sound waves to  create images of your heart. It provides your doctor with information about the size and shape of your heart and how well your hearts chambers and valves are working. This procedure takes approximately one hour. There are no restrictions for this procedure.    Follow-Up: At [facility], you and your health needs are our priority.  As part of our continuing mission to provide you with exceptional heart care, we have created designated Provider Care Teams.  These Care Teams include your primary Cardiologist (physician) and Advanced Practice Providers (APPs -  Physician Assistants and Nurse Practitioners) who all work together to provide you with the care you need, when you need it.   Your next appointment:   6 month(s)  The format for your next appointment:   In Person  Provider: 1}  Kathlen Mody, Daylene Posey, [place], Janace Litten, Katie Margaretha Sheffield, [place]  Signed, Sherren Mocha, MD  05/06/2021 7:41 PM    [place]

## 2021-05-06 NOTE — Patient Instructions (Signed)
Medication Instructions:  ?STOP Brilinta ?START Plavix (Clopidogrel) 75mg  once daily ?*If you need a refill on your cardiac medications before your next appointment, please call your pharmacy* ? ? ?Lab Work: ?CBC, BMET, BNP TODAY! ?If you have labs (blood work) drawn today and your tests are completely normal, you will receive your results only by: ?MyChart Message (if you have MyChart) OR ?A paper copy in the mail ?If you have any lab test that is abnormal or we need to change your treatment, we will call you to review the results. ? ? ?Testing/Procedures: ?ECHO ?Your physician has requested that you have an echocardiogram. Echocardiography is a painless test that uses sound waves to create images of your heart. It provides your doctor with information about the size and shape of your heart and how well your heart?s chambers and valves are working. This procedure takes approximately one hour. There are no restrictions for this procedure. ? ? ? ?Follow-Up: ?At [facility], you and your health needs are our priority.  As part of our continuing mission to provide you with exceptional heart care, we have created designated Provider Care Teams.  These Care Teams include your primary Cardiologist (physician) and Advanced Practice Providers (APPs -  Physician Assistants and Nurse Practitioners) who all work together to provide you with the care you need, when you need it. ? ? ?Your next appointment:   ?6 month(s) ? ?The format for your next appointment:   ?In Person ? ?Provider: ?1}  ?Jessica [name] ?Jessica [name] ?Jessica [name] ?Jessica [name] ?Jessica [name] ?[name], Jessica Lull ? ?  ?

## 2021-05-07 LAB — BASIC METABOLIC PANEL
BUN/Creatinine Ratio: 27 (ref 12–28)
BUN: 16 mg/dL (ref 8–27)
CO2: 22 mmol/L (ref 20–29)
Calcium: 9 mg/dL (ref 8.7–10.3)
Chloride: 107 mmol/L — ABNORMAL HIGH (ref 96–106)
Creatinine, Ser: 0.59 mg/dL (ref 0.57–1.00)
Glucose: 95 mg/dL (ref 70–99)
Potassium: 4.8 mmol/L (ref 3.5–5.2)
Sodium: 146 mmol/L — ABNORMAL HIGH (ref 134–144)
eGFR: 92 mL/min/{1.73_m2} (ref >59)

## 2021-05-07 LAB — PRO B NATRIURETIC PEPTIDE: NT-Pro BNP: 93 pg/mL (ref 0–738)

## 2021-05-07 LAB — CBC
Hematocrit: 38.5 % (ref 34.0–46.6)
Hemoglobin: 13 g/dL (ref 11.1–15.9)
MCH: 32.7 pg (ref 26.6–33.0)
MCHC: 33.8 g/dL (ref 31.5–35.7)
MCV: 97 fL (ref 79–97)
Platelets: 265 10*3/uL (ref 150–450)
RBC: 3.98 x10E6/uL (ref 3.77–5.28)
RDW: 12.9 % (ref 11.7–15.4)
WBC: 8.7 10*3/uL (ref 3.4–10.8)

## 2021-05-14 ENCOUNTER — Other Ambulatory Visit: Payer: Self-pay

## 2021-05-14 ENCOUNTER — Ambulatory Visit (HOSPITAL_COMMUNITY): Payer: Medicare Other | Attending: Cardiology

## 2021-05-14 DIAGNOSIS — I2510 Atherosclerotic heart disease of native coronary artery without angina pectoris: Secondary | ICD-10-CM | POA: Diagnosis not present

## 2021-05-14 DIAGNOSIS — E782 Mixed hyperlipidemia: Secondary | ICD-10-CM | POA: Insufficient documentation

## 2021-05-14 DIAGNOSIS — R0609 Other forms of dyspnea: Secondary | ICD-10-CM | POA: Diagnosis not present

## 2021-05-14 LAB — ECHOCARDIOGRAM COMPLETE
Area-P 1/2: 2.78 cm2
P 1/2 time: 439 msec
S' Lateral: 2.9 cm

## 2021-05-24 ENCOUNTER — Other Ambulatory Visit: Payer: Self-pay

## 2021-05-24 ENCOUNTER — Encounter: Payer: Self-pay | Admitting: Family

## 2021-05-24 ENCOUNTER — Telehealth (INDEPENDENT_AMBULATORY_CARE_PROVIDER_SITE_OTHER): Payer: Medicare Other | Admitting: Family

## 2021-05-24 ENCOUNTER — Encounter: Payer: Self-pay | Admitting: Internal Medicine

## 2021-05-24 DIAGNOSIS — U071 COVID-19: Secondary | ICD-10-CM | POA: Diagnosis not present

## 2021-05-24 DIAGNOSIS — J069 Acute upper respiratory infection, unspecified: Secondary | ICD-10-CM

## 2021-05-24 MED ORDER — ZINC GLUCONATE 50 MG PO TABS: 50.0000 mg | ORAL_TABLET | Freq: Every day | ORAL | 0 refills | Status: AC

## 2021-05-24 MED ORDER — NIRMATRELVIR/RITONAVIR (PAXLOVID)TABLET: 3.0000 | ORAL_TABLET | Freq: Two times a day (BID) | ORAL | 0 refills | Status: AC

## 2021-05-24 NOTE — Progress Notes (Signed)
ot  ?This service is provided via telemedicine ? ?No vital signs collected/recorded due to the encounter was a telemedicine visit.  ? ?Location of patient (ex: home, work):  Home. ? ?Patient consents to a telephone visit:  Yes ? ?Location of the provider (ex: office, home):  [employer]. ? ?Name of any referring provider:  Virgie Dad, MD  ? ?Names of all persons participating in the telemedicine service and their role in the encounter:  Patient, Heriberto Antigua, [place], [place], [place], NP.   ? ?Time spent on call: 8 minutes spent on the phone with Medical Assistant.  ? ? ? ?Provider: Marlowe Sax FNP-C ? ?Virgie Dad, MD ? ?Patient Care Team: ?Virgie Dad, MD as PCP - General (Internal Medicine) ?Sherren Mocha, MD as PCP - Cardiology (Cardiology) ?Ronald Lobo, MD as Consulting Physician (Gastroenterology) ?Katy Apo, MD as Consulting Physician (Ophthalmology) ?Martinique, Amy, MD as Consulting Physician (Dermatology) ?Mast, Man X, NP as Nurse Practitioner (Internal Medicine) ? ?Extended Emergency Contact Information ?Primary Emergency Contact: Penning,Nicholas ?Address: [address] [place] [address] [place] ?Home Phone: [phone] ?Mobile Phone: [phone] ?Relation: Son ?Secondary Emergency Contact: Snavely,Celia ?Address: [place] ?         [place], [place] [address] [place] ?Home Phone: [phone] ?Mobile Phone: [phone] ?Relation: Friend ? ?Code Status:  Full Code  ?Goals of care: Advanced Directive information ?Advanced Directives 05/24/2021  ?Does Patient Have a Medical Advance Directive? Yes  ?Type of Paramedic of [place];Living will  ?Does patient want to make changes to medical advance directive? No - Patient declined  ?Copy of [place] in Chart? Yes - validated most recent copy scanned in chart (See row information)  ?Would patient like information on creating  a medical advance directive? -  ?Pre-existing out of facility DNR order (yellow form or pink MOST form) -  ? ? ? ?Chief Complaint  ?Patient presents with  ? Acute Visit  ?  Patient complains of chills, fatigue, sore throat, body aches, headache, and congestion. Patient tested POSITIVE for Covid-19 this morning. Patient symptoms started 3 days ago.   ? ? ?HPI:  ?Pt is a 81 y.o. female seen today for an acute visit for evaluation of COVID-positive test today.  States was a normal exam on Thursday 3/17/ 2023 with [place] who did not have the mask on.  On Saturday she started having chills ,shaky, tired and felt bad.  Today she has a sore throat ,congestion cough and no proper diet. She coughs up mucus sometimes ?She denies any nausea vomiting or diarrhea. ? ? ?Past Medical History:  ?Diagnosis Date  ? Anemia 12/2015  ? Bronchitis, chronic ([place])   ? in her 66s  ? Cataract   ? Chronic kidney disease   ? in 46s had nephritis  ? Closed fracture of right distal humerus   ? right  ? Coronary artery disease   ? Hearing loss 05/03/2016  ? High cholesterol   ? Hypothyroidism   ? Osteopenia   ? Pericarditis 01/1999  ? Pneumothorax 01/1999  ? Thyroid disorder   ? Vitamin B 12 deficiency   ? ?Past Surgical History:  ?Procedure Laterality Date  ? birthmark     ? removed,left leg  ? CARDIAC CATHETERIZATION    ? CATARACT EXTRACTION  2017  ? CORONARY STENT INTERVENTION N/A 09/01/2020  ? Procedure:  CORONARY STENT INTERVENTION;  Surgeon: Belva Crome, MD;  Location: [facility];  Service: Cardiovascular;  Laterality: N/A;  ? LEFT HEART CATH AND CORONARY ANGIOGRAPHY N/A 09/01/2020  ? Procedure: LEFT HEART CATH AND CORONARY ANGIOGRAPHY;  Surgeon: Belva Crome, MD;  Location: [facility];  Service: Cardiovascular;  Laterality: N/A;  ? ORIF HUMERUS FRACTURE Right 04/10/2019  ? Procedure: RIGHT OPEN REDUCTION INTERNAL FIXATION (ORIF) DISTAL HUMERUS FRACTURE WITH EXTENSION;  Surgeon: Hiram Gash, MD;   Location: [facility];  Service: Orthopedics;  Laterality: Right;  ? [place]?  ? ? ?Allergies  ?Allergen Reactions  ? Atorvastatin   ?  Muscle weakness  ? ? ?Outpatient Encounter Medications as of 05/24/2021  ?Medication Sig  ? acetaminophen (TYLENOL) 650 MG CR tablet Take 1,300 mg by mouth every 8 (eight) hours as needed for pain.  ? aspirin 81 MG chewable tablet Chew 1 tablet (81 mg total) by mouth daily.  ? Calcium Carb-Cholecalciferol (CALCIUM 600+D) 600-800 MG-UNIT TABS Take 2 tablets by mouth daily.  ? cholecalciferol (VITAMIN D) 1000 UNITS tablet Take 2,000 Units by mouth daily.   ? clopidogrel (PLAVIX) 75 MG tablet Take 1 tablet (75 mg total) by mouth daily.  ? denosumab (PROLIA) 60 MG/ML SOSY injection Inject 60 mg into the skin every 6 (six) months.  ? ferrous sulfate 325 (65 FE) MG tablet Take 325 mg by mouth daily with breakfast.  ? l-methylfolate-B6-B12 (METANX) 3-35-2 MG TABS tablet Take 1 tablet by mouth daily.  ? levothyroxine (SYNTHROID) 112 MCG tablet TAKE 1 TABLET BY MOUTH DAILY  ? methocarbamol (ROBAXIN) 500 MG tablet Take 0.5 tablets (250 mg total) by mouth 2 (two) times daily as needed for muscle spasms.  ? metoprolol succinate (TOPROL-XL) 25 MG 24 hr tablet TAKE 1/2 TABLET BY MOUTH DAILY  ? nitroGLYCERIN (NITROSTAT) 0.4 MG SL tablet Place 1 tablet (0.4 mg total) under the tongue every 5 (five) minutes as needed for chest pain.  ? rosuvastatin (CRESTOR) 20 MG tablet TAKE 1 TABLET(20 MG) BY MOUTH DAILY  ? vitamin C (ASCORBIC ACID) 500 MG tablet Take 500 mg by mouth daily.  ? [DISCONTINUED] calcium carbonate (OS-CAL) 1250 (500 Ca) MG chewable tablet Chew 1 tablet (1,250 mg total) by mouth daily.  ? ?No facility-administered encounter medications on file as of 05/24/2021.  ? ? ?Review of Systems  ?Constitutional:  Positive for appetite change, chills and fatigue. Negative for fever and unexpected weight change.  ?     Generalized body aches.   ?HENT:   Positive for congestion and rhinorrhea. Negative for dental problem, ear discharge, ear pain, facial swelling, hearing loss, nosebleeds, postnasal drip, sinus pressure, sinus pain, sneezing, sore throat, tinnitus and trouble swallowing.   ?Eyes:  Negative for pain, discharge, redness, itching and visual disturbance.  ?Respiratory:  Positive for cough. Negative for chest tightness, shortness of breath and wheezing.   ?Cardiovascular:  Negative for chest pain, palpitations and leg swelling.  ?Gastrointestinal:  Negative for abdominal distention, abdominal pain, blood in stool, constipation, diarrhea, nausea and vomiting.  ?Skin:  Negative for color change, pallor and rash.  ?Neurological:  Negative for dizziness, weakness, light-headedness and headaches.  ? ?Immunization History  ?Administered Date(s) Administered  ? Influenza Whole 12/29/2006  ? Influenza, High Dose Seasonal PF 11/21/2016, 11/20/2017, 11/01/2018, 11/10/2020  ? Influenza,inj,Quad PF,6+ Mos 12/04/2013, 11/12/2014, 11/18/2015  ? Influenza-Unspecified 12/02/2011, 11/15/2012, 12/02/2016, 11/26/2019  ? Moderna SARS-COV2 Booster Vaccination 01/01/2020, 07/06/2020  ? Moderna  Sars-Covid-2 Vaccination 03/11/2019, 04/08/2019  ? PFIZER(Purple Top)SARS-COV-2 Vaccination 11/25/2020  ? PPD Test 01/05/2015  ? Pneumococcal Conjugate-13 08/19/2014  ? Pneumococcal Polysaccharide-23 11/18/2015  ? Td 06/02/2008  ? Tdap 05/31/2018  ? Zoster Recombinat (Shingrix) 01/06/2016, 05/02/2016  ? Zoster, Live 03/08/2011  ? ?Pertinent  Health Maintenance Due  ?Topic Date Due  ? COLONOSCOPY (Pts 45-17yr Insurance coverage will need to be confirmed)  02/03/2026 (Originally 07/11/2020)  ? INFLUENZA VACCINE  Completed  ? DEXA SCAN  Completed  ? ?Fall Risk 12/02/2020 12/02/2020 02/03/2021 02/08/2021 05/24/2021  ?Falls in the past year? 0 0 0 0 0  ?Was there an injury with Fall? - - 0 0 0  ?Was there an injury with Fall? - - - - -  ?Fall Risk Category Calculator - - 0 0 0  ?Fall Risk Category  - - Low Low Low  ?Patient Fall Risk Level Low fall risk - Low fall risk Low fall risk Low fall risk  ?Patient Fall Risk Level - - - - -  ?Patient at Risk for Falls Due to - - No Fall Risks No Fall Risks No F

## 2021-05-24 NOTE — Patient Instructions (Addendum)
-   Continue on vitamin D and vitamin C supplement ? ?-Increase fluid intake/tea/or soup ? ?-Self quarantine for 5 days ? ?-Take Paxlovid as prescribed ? ?-Notify provider or go to the ED if symptoms worsen ? ?

## 2021-05-27 ENCOUNTER — Telehealth: Payer: Self-pay

## 2021-05-27 NOTE — Telephone Encounter (Signed)
Called patient to schedule her for a Medicare AWV but refuses because she has COVID. May call back to reschedule. ?

## 2021-06-03 DIAGNOSIS — Z006 Encounter for examination for normal comparison and control in clinical research program: Secondary | ICD-10-CM

## 2021-06-03 NOTE — Research (Signed)
Patient seen in the clinic for [name] randomization Visit. Reviewed inclusions and exclusions with patient and she meets the criteria for the study and wishes to proceed. Patient signed the revised ICF. All medications and medical history reviewed with the patient. She denies any recent adverse events since screening. EKG completed. Dr Lia Foyer, sub-I, saw the patient and discussed the study in depth with patient. Since patient recently had COVID Dr Lia Foyer would like to wait a few weeks before giving the IP injection to let her body recover from the virus. No IP given nor lab work drawn. Patient to return to the clinic April 4th ?

## 2021-06-05 DIAGNOSIS — Z20822 Contact with and (suspected) exposure to covid-19: Secondary | ICD-10-CM | POA: Diagnosis not present

## 2021-06-07 ENCOUNTER — Encounter: Payer: Self-pay | Admitting: *Deleted

## 2021-06-07 DIAGNOSIS — Z006 Encounter for examination for normal comparison and control in clinical research program: Secondary | ICD-10-CM

## 2021-06-07 NOTE — Research (Signed)
Message left to remind Jessica [name] of her appointment tomorrow with research at 830, also nothing to eat or drink and the parking code. Encouraged her to call with any questions.  ?

## 2021-06-08 ENCOUNTER — Other Ambulatory Visit: Payer: Self-pay

## 2021-06-08 VITALS — BP 132/58 | HR 58

## 2021-06-08 DIAGNOSIS — Z006 Encounter for examination for normal comparison and control in clinical research program: Secondary | ICD-10-CM

## 2021-06-08 MED ORDER — STUDY - OCEAN(A) - OLPASIRAN (AMG 890) 142 MG/ML OR PLACEBO SQ INJECTION (PI-HILTY)
142.0000 mg | PREFILLED_SYRINGE | Freq: Once | SUBCUTANEOUS | Status: AC
  Administered 2021-06-08: 142 mg via SUBCUTANEOUS
  Filled 2021-06-08: qty 1

## 2021-06-08 NOTE — Research (Signed)
? ?AMGEN [id number] SITE #: [id number] SUBJECT ID#: ?[id number]  ?                  ELIGIBLITY CRITERIA WORKSHEET ?                            INCLUSION CRITERIA ?PROVIDED INFORMED CONSENT _0   ?AGE 80 TO ? 38 _1   ?Lp(a) ? 200 nmol/L during screening by central lab _2   ?MI (presumed type 1 event due to plaque rupture       AND/OR _3   ?PCI stenting AND one of the following: _4   ?AGE ? 65 _5   ?Residual coronary artery stenosis > 50% in any major vessel _6   ?Multivessel PCI _7   ?Symptomatic peripheral arterial disease with either (a) intermittent claudication with ankle-brachial index (ABI) < 0.85, or (b) peripheral arterial revascularization or amputation due to atherosclerotic disease _8   ?Ischemic stroke (presumed atherosclerotic in origin) _9   ?DM    HbA1c:  _________________ _10   ?                     EXCLUSION CRITERIA           N/A                          _11  ?Severe renal function eGFR < 4m/min/1.73 m? _12   ?Active liver disease, known hepatitis, or any hepatic dysfunction, defined as AST or ALT > 3 x upper limit of normal, or total bilirubin > 2 x ULN during screening _13   ?History of hemorrhagic stroke _14   ?History of major bleeding disorder _15   ?Major cardiovascular event within 4 weeks prior to Lp(a) screening or during screening _16   ?Planned cardiac or arterial revascularization _17   ?Malignancy within the last 5 years prior to Study day 1 (except non-melanoma skin cancers, cervical and breast ductal carcinoma in situ, or stage 1 prostate) _18   ?Severe Heart Failure Class IV, and/or left EF < 30% _19   ?Uncontrolled or recurrent Ventricular tachycardia in the last 3 months _20   ?A fib/flutter not on therapeutic anticoagulation or having placement of left atrial appendage closure device _21   ?Uncontrolled HTN at screening, systolic > 1734LPFXor diastolic > 1902IOXB<<[id number]>_2<\/IWLNLGXQJJHERDEY>_81  ?Fasting triglycerides > 408mdL during screening _23   ?HbA1c > 10% _24   ?Know major active infection _25   ?Current or planned lipoprotein apheresis  or < 3 months since last apheresis _26   ?Previously received RNA therapy specifically targeting Lp(a) _27   ?  ? ?Patient seen in clinic for [facility] study visit day 1, randomization. Patient denies abdominal pain, nausea/vomiting. Denies any ED or urgent care visits since last visit. Denies signs/symptoms of pancreatitis. Denies other pain. Vital signs obtained at 0837. Labs drawn at [place] ?Past and current medical history reviewed. Surgical history reviewed. Medications reviewed and updated with patient. ? ?[name] IP ADMINISTRATION ?Subject ID# [id number] Box #:[id number] loFWY#:[id number] site: Left Lower Abdomen ? ?Current Outpatient Medications:  ?  loratadine (CLARITIN) 10 MG tablet, Take 10 mg by mouth daily., Disp: , Rfl:  ?  Study - [name] - olpasiran (AMG 890) 142 mg/mL or placebo SQ injection (PI-Hilty), Inject 142 mg into the skin once. For investigational use only - Inject 1 mL (1 prefilled syringe) subcutaneously into appropriate injection site per protocol. (Approved injection site(s): upper arm, upper thigh, abdomen). Please contact [facility] if any questions., Disp: ,  Rfl:  ?  acetaminophen (TYLENOL) 650 MG CR tablet, Take 1,300 mg by mouth every 8 (eight) hours as needed for pain., Disp: , Rfl:  ?  aspirin 81 MG chewable tablet, Chew 1 tablet (81 mg total) by mouth daily., Disp: 90 tablet, Rfl: 2 ?  Calcium Carb-Cholecalciferol (CALCIUM 600+D) 600-800 MG-UNIT TABS, Take 2 tablets by mouth daily., Disp: , Rfl:  ?  cholecalciferol (VITAMIN D) 1000 UNITS tablet, Take 2,000 Units by mouth daily. , Disp: , Rfl:  ?  clopidogrel (PLAVIX) 75 MG tablet, Take 1 tablet (75 mg total) by mouth daily., Disp: 90 tablet, Rfl: 3 ?  denosumab (PROLIA) 60 MG/ML SOSY injection, Inject 60 mg into the skin every 6 (six) months., Disp: , Rfl:  ?  ferrous sulfate 325 (65 FE) MG tablet, Take 325 mg by mouth daily with breakfast., Disp: , Rfl:  ?  l-methylfolate-B6-B12  (METANX) 3-35-2 MG TABS tablet, Take 1 tablet by mouth daily., Disp: , Rfl:  ?  levothyroxine (SYNTHROID) 112 MCG tablet, TAKE 1 TABLET BY MOUTH DAILY, Disp: 90 tablet, Rfl: 3 ?  methocarbamol (ROBAXIN) 500 MG tablet, Take 0.5 tablets (250 mg total) by mouth 2 (two) times daily as needed for muscle spasms., Disp: 30 tablet, Rfl: 1 ?  metoprolol succinate (TOPROL-XL) 25 MG 24 hr tablet, TAKE 1/2 TABLET BY MOUTH DAILY, Disp: 45 tablet, Rfl: 0 ?  nitroGLYCERIN (NITROSTAT) 0.4 MG SL tablet, Place 1 tablet (0.4 mg total) under the tongue every 5 (five) minutes as needed for chest pain., Disp: 25 tablet, Rfl: 2 ?  rosuvastatin (CRESTOR) 20 MG tablet, TAKE 1 TABLET(20 MG) BY MOUTH DAILY, Disp: 30 tablet, Rfl: 6 ?  vitamin C (ASCORBIC ACID) 500 MG tablet, Take 500 mg by mouth daily., Disp: , Rfl:  ? ?Current Facility-Administered Medications:  ?  Study - [name] - olpasiran (AMG 890) 142 mg/mL or placebo SQ injection (PI-Hilty), 142 mg, Subcutaneous, Once, Hilty, Nadean Corwin, MD  ? ? ?Patient returned to clinic for PK lab draw only. Lab was drawn at 1121. Patients next visit scheduled for 07/07/21 at 0830. ? ?

## 2021-06-10 DIAGNOSIS — E039 Hypothyroidism, unspecified: Secondary | ICD-10-CM

## 2021-06-10 NOTE — Research (Signed)
[name] Randomization Visit Lab Results ? ? ? ? ? ? ? ?No abnormal labs ?

## 2021-06-11 LAB — TSH: TSH: 1.86 mIU/L (ref 0.40–4.50)

## 2021-06-11 NOTE — Research (Addendum)
[name] randomization visit lab results ? ?ABNORMAL LABS: ?TEST RESULT  ?cholesterol 198  ?   ?   ?   ? ?Are these clinically significant? ?'[]'$  YES              '[x]'$  NO  ? ?Pixie Casino, MD, [facility], FACP  ?[place]  ?Medical Director of the [facility] &  ?Cardiovascular [facility] Reduction [facility] ?Diplomate of the [employer] of Clinical Lipidology ?Attending Cardiologist  ?Direct Dial: [phone]  Fax: [phone]  ?Website:  [email or web address] ? ?

## 2021-06-16 NOTE — Research (Addendum)
[name] RANDOMIZATION VISIT LAB RESULTS ?SUBJECT # [id number] ? ? ? ?

## 2021-06-18 DIAGNOSIS — L602 Onychogryphosis: Secondary | ICD-10-CM | POA: Diagnosis not present

## 2021-06-18 DIAGNOSIS — M79671 Pain in right foot: Secondary | ICD-10-CM | POA: Diagnosis not present

## 2021-06-18 DIAGNOSIS — M79672 Pain in left foot: Secondary | ICD-10-CM | POA: Diagnosis not present

## 2021-06-21 ENCOUNTER — Encounter: Payer: Self-pay | Admitting: Cardiovascular Disease

## 2021-06-21 NOTE — Progress Notes (Signed)
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 This drug is administered as SQ injection every 12 weeks and study will follow participants for 4 years. ?

## 2021-06-29 ENCOUNTER — Encounter: Payer: Self-pay | Admitting: Internal Medicine

## 2021-07-06 DIAGNOSIS — Z20822 Contact with and (suspected) exposure to covid-19: Secondary | ICD-10-CM | POA: Diagnosis not present

## 2021-07-07 DIAGNOSIS — Z006 Encounter for examination for normal comparison and control in clinical research program: Secondary | ICD-10-CM

## 2021-07-07 NOTE — Research (Signed)
Patient seen in the research clinic for week 4 visit of [name] trial. Denies any adverse events or hospitalizations since last visit. Reviewed medications with patient and no changes noted at this time. Lab work was completed per protocol and patient tolerated without any complications. Next appointment Week 12 was made for June 21 @ 0830. ? ? ?Current Outpatient Medications:  ?  acetaminophen (TYLENOL) 650 MG CR tablet, Take 1,300 mg by mouth every 8 (eight) hours as needed for pain., Disp: , Rfl:  ?  aspirin 81 MG chewable tablet, Chew 1 tablet (81 mg total) by mouth daily., Disp: 90 tablet, Rfl: 2 ?  Calcium Carb-Cholecalciferol (CALCIUM 600+D) 600-800 MG-UNIT TABS, Take 2 tablets by mouth daily., Disp: , Rfl:  ?  cholecalciferol (VITAMIN D) 1000 UNITS tablet, Take 2,000 Units by mouth daily. , Disp: , Rfl:  ?  clopidogrel (PLAVIX) 75 MG tablet, Take 1 tablet (75 mg total) by mouth daily., Disp: 90 tablet, Rfl: 3 ?  denosumab (PROLIA) 60 MG/ML SOSY injection, Inject 60 mg into the skin every 6 (six) months., Disp: , Rfl:  ?  ferrous sulfate 325 (65 FE) MG tablet, Take 325 mg by mouth daily with breakfast., Disp: , Rfl:  ?  l-methylfolate-B6-B12 (METANX) 3-35-2 MG TABS tablet, Take 1 tablet by mouth daily., Disp: , Rfl:  ?  levothyroxine (SYNTHROID) 112 MCG tablet, TAKE 1 TABLET BY MOUTH DAILY, Disp: 90 tablet, Rfl: 3 ?  loratadine (CLARITIN) 10 MG tablet, Take 10 mg by mouth daily., Disp: , Rfl:  ?  methocarbamol (ROBAXIN) 500 MG tablet, Take 0.5 tablets (250 mg total) by mouth 2 (two) times daily as needed for muscle spasms., Disp: 30 tablet, Rfl: 1 ?  metoprolol succinate (TOPROL-XL) 25 MG 24 hr tablet, TAKE 1/2 TABLET BY MOUTH DAILY, Disp: 45 tablet, Rfl: 0 ?  nitroGLYCERIN (NITROSTAT) 0.4 MG SL tablet, Place 1 tablet (0.4 mg total) under the tongue every 5 (five) minutes as needed for chest pain., Disp: 25 tablet, Rfl: 2 ?  rosuvastatin (CRESTOR) 20 MG tablet, TAKE 1 TABLET(20 MG) BY MOUTH DAILY, Disp: 30  tablet, Rfl: 6 ?  Study - [name] - olpasiran (AMG 890) 142 mg/mL or placebo SQ injection (PI-Hilty), Inject 142 mg into the skin once. For investigational use only - Inject 1 mL (1 prefilled syringe) subcutaneously into appropriate injection site per protocol. (Approved injection site(s): upper arm, upper thigh, abdomen). Please contact [facility] if any questions., Disp: , Rfl:  ?  vitamin C (ASCORBIC ACID) 500 MG tablet, Take 500 mg by mouth daily., Disp: , Rfl:   ?

## 2021-07-12 NOTE — Research (Signed)
[name] wk 4 lab results [id number] ? ? ? ? ? ? ? ?All Labs within normal range ?

## 2021-08-12 DIAGNOSIS — L900 Lichen sclerosus et atrophicus: Secondary | ICD-10-CM | POA: Diagnosis not present

## 2021-08-25 VITALS — BP 96/62 | HR 54

## 2021-08-25 DIAGNOSIS — Z006 Encounter for examination for normal comparison and control in clinical research program: Secondary | ICD-10-CM

## 2021-08-25 MED ORDER — STUDY - OCEAN(A) - OLPASIRAN (AMG 890) 142 MG/ML OR PLACEBO SQ INJECTION (PI-HILTY)
142.0000 mg | PREFILLED_SYRINGE | Freq: Once | SUBCUTANEOUS | Status: AC
  Administered 2021-08-25: 142 mg via SUBCUTANEOUS
  Filled 2021-08-25: qty 1

## 2021-08-25 NOTE — Research (Addendum)
[name] IP ADMINISTRATION Subject [id number] IP Box #: [id number] IP lot#: [id number] Time Given: [phone] Administration site: lt arm Patient was seen in the research clinic today for visit 12 of the [name] trial. Reviewed all concomitant medications with patient and no changes noted at this time. Patient denies any adverse events or hospitalizations since her last research appointment. Lab work and IP completed per protocol and patient tolerated without any complaints. Next appointment for week 24 scheduled for Sept 20th @ 0800. Patient is to return later this morning for PK lab draw. Patient's blood pressure was 96/62, denies any symptoms. States that it does run low at times. Instructed her on signs and symptoms of low blood pressure and to keep a check on it. She verbalized  understanding Patient returned @ 1020 for PK lab draw   Current Outpatient Medications:    acetaminophen (TYLENOL) 650 MG CR tablet, Take 1,300 mg by mouth every 8 (eight) hours as needed for pain., Disp: , Rfl:    aspirin 81 MG chewable tablet, Chew 1 tablet (81 mg total) by mouth daily., Disp: 90 tablet, Rfl: 2   Calcium Carb-Cholecalciferol (CALCIUM 600+D) 600-800 MG-UNIT TABS, Take 2 tablets by mouth daily., Disp: , Rfl:    cholecalciferol (VITAMIN D) 1000 UNITS tablet, Take 2,000 Units by mouth daily. , Disp: , Rfl:    clopidogrel (PLAVIX) 75 MG tablet, Take 1 tablet (75 mg total) by mouth daily., Disp: 90 tablet, Rfl: 3   denosumab (PROLIA) 60 MG/ML SOSY injection, Inject 60 mg into the skin every 6 (six) months., Disp: , Rfl:    ferrous sulfate 325 (65 FE) MG tablet, Take 325 mg by mouth daily with breakfast., Disp: , Rfl:    l-methylfolate-B6-B12 (METANX) 3-35-2 MG TABS tablet, Take 1 tablet by mouth daily., Disp: , Rfl:    levothyroxine (SYNTHROID) 112 MCG tablet, TAKE 1 TABLET BY MOUTH DAILY, Disp: 90 tablet, Rfl: 3   loratadine (CLARITIN) 10 MG tablet, Take 10 mg by mouth daily., Disp: , Rfl:    methocarbamol  (ROBAXIN) 500 MG tablet, Take 0.5 tablets (250 mg total) by mouth 2 (two) times daily as needed for muscle spasms., Disp: 30 tablet, Rfl: 1   metoprolol succinate (TOPROL-XL) 25 MG 24 hr tablet, TAKE 1/2 TABLET BY MOUTH DAILY, Disp: 45 tablet, Rfl: 0   nitroGLYCERIN (NITROSTAT) 0.4 MG SL tablet, Place 1 tablet (0.4 mg total) under the tongue every 5 (five) minutes as needed for chest pain., Disp: 25 tablet, Rfl: 2   rosuvastatin (CRESTOR) 20 MG tablet, TAKE 1 TABLET(20 MG) BY MOUTH DAILY, Disp: 30 tablet, Rfl: 6   Study - [name] - olpasiran (AMG 890) 142 mg/mL or placebo SQ injection (PI-Hilty), Inject 142 mg into the skin once. For investigational use only - Inject 1 mL (1 prefilled syringe) subcutaneously into appropriate injection site per protocol. (Approved injection site(s): upper arm, upper thigh, abdomen). Please contact [facility] if any questions., Disp: , Rfl:    vitamin C (ASCORBIC ACID) 500 MG tablet, Take 500 mg by mouth daily., Disp: , Rfl:

## 2021-08-30 ENCOUNTER — Non-Acute Institutional Stay: Payer: Medicare Other | Admitting: Orthopedic Surgery

## 2021-08-30 ENCOUNTER — Encounter: Payer: Self-pay | Admitting: Orthopedic Surgery

## 2021-08-30 VITALS — BP 126/72 | HR 60 | Temp 97.2°F | Resp 16 | Ht 63.0 in | Wt 153.9 lb

## 2021-08-30 DIAGNOSIS — M25551 Pain in right hip: Secondary | ICD-10-CM

## 2021-08-31 ENCOUNTER — Ambulatory Visit (HOSPITAL_BASED_OUTPATIENT_CLINIC_OR_DEPARTMENT_OTHER)
Admission: RE | Admit: 2021-08-31 | Discharge: 2021-08-31 | Disposition: A | Discharge status: Home / Self Care | Payer: Medicare Other | Source: Ambulatory Visit | Attending: Orthopedic Surgery | Admitting: Orthopedic Surgery

## 2021-08-31 DIAGNOSIS — M160 Bilateral primary osteoarthritis of hip: Secondary | ICD-10-CM | POA: Diagnosis not present

## 2021-08-31 DIAGNOSIS — M25551 Pain in right hip: Secondary | ICD-10-CM | POA: Diagnosis not present

## 2021-09-22 ENCOUNTER — Other Ambulatory Visit: Payer: Self-pay | Admitting: Internal Medicine

## 2021-09-22 DIAGNOSIS — E039 Hypothyroidism, unspecified: Secondary | ICD-10-CM

## 2021-09-22 DIAGNOSIS — E782 Mixed hyperlipidemia: Secondary | ICD-10-CM

## 2021-09-22 DIAGNOSIS — I214 Non-ST elevation (NSTEMI) myocardial infarction: Secondary | ICD-10-CM

## 2021-09-22 DIAGNOSIS — M810 Age-related osteoporosis without current pathological fracture: Secondary | ICD-10-CM

## 2021-09-23 DIAGNOSIS — M810 Age-related osteoporosis without current pathological fracture: Secondary | ICD-10-CM

## 2021-09-23 DIAGNOSIS — E782 Mixed hyperlipidemia: Secondary | ICD-10-CM | POA: Diagnosis not present

## 2021-09-23 DIAGNOSIS — E039 Hypothyroidism, unspecified: Secondary | ICD-10-CM

## 2021-09-23 DIAGNOSIS — I214 Non-ST elevation (NSTEMI) myocardial infarction: Secondary | ICD-10-CM | POA: Diagnosis not present

## 2021-09-24 LAB — LIPID PANEL
Cholesterol: 139 mg/dL (ref <200)
HDL: 49 mg/dL — ABNORMAL LOW (ref >=50)
LDL Cholesterol (Calc): 72 mg/dL (calc)
Non-HDL Cholesterol (Calc): 90 mg/dL (calc) (ref <130)
Total CHOL/HDL Ratio: 2.8 (calc) (ref <5.0)
Triglycerides: 101 mg/dL (ref <150)

## 2021-09-24 LAB — CBC WITH DIFFERENTIAL/PLATELET
Absolute Monocytes: 563 cells/uL (ref 200–950)
Basophils Absolute: 38 cells/uL (ref 0–200)
Basophils Relative: 0.5 %
Eosinophils Absolute: 240 cells/uL (ref 15–500)
Eosinophils Relative: 3.2 %
HCT: 38.7 % (ref 35.0–45.0)
Hemoglobin: 13.3 g/dL (ref 11.7–15.5)
Lymphs Abs: 2010 cells/uL (ref 850–3900)
MCH: 32.5 pg (ref 27.0–33.0)
MCHC: 34.4 g/dL (ref 32.0–36.0)
MCV: 94.6 fL (ref 80.0–100.0)
MPV: 11.1 fL (ref 7.5–12.5)
Monocytes Relative: 7.5 %
Neutro Abs: 4650 cells/uL (ref 1500–7800)
Neutrophils Relative %: 62 %
Platelets: 245 10*3/uL (ref 140–400)
RBC: 4.09 10*6/uL (ref 3.80–5.10)
RDW: 12.9 % (ref 11.0–15.0)
Total Lymphocyte: 26.8 %
WBC: 7.5 10*3/uL (ref 3.8–10.8)

## 2021-09-24 LAB — COMPLETE METABOLIC PANEL WITH GFR
AG Ratio: 2.1 (calc) (ref 1.0–2.5)
ALT: 16 U/L (ref 6–29)
AST: 16 U/L (ref 10–35)
Albumin: 4.1 g/dL (ref 3.6–5.1)
Alkaline phosphatase (APISO): 40 U/L (ref 37–153)
BUN: 18 mg/dL (ref 7–25)
CO2: 22 mmol/L (ref 20–32)
Calcium: 9 mg/dL (ref 8.6–10.4)
Chloride: 105 mmol/L (ref 98–110)
Creat: 0.61 mg/dL (ref 0.60–0.95)
Globulin: 2 g/dL (calc) (ref 1.9–3.7)
Glucose, Bld: 79 mg/dL (ref 65–99)
Potassium: 4.2 mmol/L (ref 3.5–5.3)
Sodium: 138 mmol/L (ref 135–146)
Total Bilirubin: 0.5 mg/dL (ref 0.2–1.2)
Total Protein: 6.1 g/dL (ref 6.1–8.1)
eGFR: 90 mL/min/{1.73_m2} (ref >=60)

## 2021-09-24 LAB — TSH: TSH: 1.81 mIU/L (ref 0.40–4.50)

## 2021-09-29 ENCOUNTER — Non-Acute Institutional Stay: Payer: Medicare Other | Admitting: Internal Medicine

## 2021-09-29 ENCOUNTER — Encounter: Payer: Self-pay | Admitting: Internal Medicine

## 2021-09-29 VITALS — BP 111/74 | HR 60 | Temp 97.1°F | Ht 63.0 in | Wt 155.1 lb

## 2021-09-29 DIAGNOSIS — E039 Hypothyroidism, unspecified: Secondary | ICD-10-CM | POA: Diagnosis not present

## 2021-09-29 DIAGNOSIS — E782 Mixed hyperlipidemia: Secondary | ICD-10-CM

## 2021-09-29 DIAGNOSIS — I214 Non-ST elevation (NSTEMI) myocardial infarction: Secondary | ICD-10-CM

## 2021-09-29 DIAGNOSIS — M810 Age-related osteoporosis without current pathological fracture: Secondary | ICD-10-CM | POA: Diagnosis not present

## 2021-09-29 DIAGNOSIS — M25551 Pain in right hip: Secondary | ICD-10-CM | POA: Diagnosis not present

## 2021-09-30 NOTE — Progress Notes (Signed)
Location:  [facility] (12)  Provider:   Code Status:  Goals of Care:     09/29/2021    2:01 PM  Advanced Directives  Does Patient Have a Medical Advance Directive? Yes  Type of Paramedic of [place];Living will  Does patient want to make changes to medical advance directive? No - Patient declined  Copy of Kevin in Chart? Yes - validated most recent copy scanned in chart (See row information)     Chief Complaint  Patient presents with   Medical Management of Chronic Issues    Patient returns to the clinic for 6 month follow up and discuss labs.     HPI: Patient is a 81 y.o. female seen today for medical management of chronic diseases.    Patient h/o  NSTEMI 06/22 cardiac cath which showed subtotal occlusion of an LAD.  Underwent successful angioplasty and DES.  Does have residual 50% RCA lesion Her EF was 50-55% with No Wall motion Abnormalities now H/o umbilical hernia Surgery said no issues History of osteoporosis on Fosamax Hyperlipidemia, hypothyroidism, history of peripheral neuropathy history of shortness of breath with exertion  Continues to do well Has c/o Feeling Fatigue and tired. 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  Covid Diagnosis in 03/23 HI Pain Xray positive for Degenerative changes No Falls    Past Medical History:  Diagnosis Date   Anemia 12/2015   Bronchitis, chronic (HCC)    in her 71s   Cataract    Chronic kidney disease    in 20s had nephritis   Closed fracture of right distal humerus    right   Coronary artery disease    Hearing loss 05/03/2016   High cholesterol    Hypothyroidism    Osteopenia    Pericarditis 01/1999   Pneumothorax 01/1999   Thyroid disorder    Vitamin B 12 deficiency     Past  Surgical History:  Procedure Laterality Date   birthmark      removed,left leg   CARDIAC CATHETERIZATION     CATARACT EXTRACTION  2017   CORONARY STENT INTERVENTION N/A 09/01/2020   Procedure: CORONARY STENT INTERVENTION;  Surgeon: Belva Crome, MD;  Location: [facility];  Service: Cardiovascular;  Laterality: N/A;   LEFT HEART CATH AND CORONARY ANGIOGRAPHY N/A 09/01/2020   Procedure: LEFT HEART CATH AND CORONARY ANGIOGRAPHY;  Surgeon: Belva Crome, MD;  Location: [place];  Service: Cardiovascular;  Laterality: N/A;   ORIF HUMERUS FRACTURE Right 04/10/2019   Procedure: RIGHT OPEN REDUCTION INTERNAL FIXATION (ORIF) DISTAL HUMERUS FRACTURE WITH EXTENSION;  Surgeon: Hiram Gash, MD;  Location: [facility];  Service: Orthopedics;  Laterality: Right;   [place]?    Allergies  Allergen Reactions   Atorvastatin     Muscle weakness    Outpatient Encounter Medications as of 09/29/2021  Medication Sig   acetaminophen (TYLENOL) 650 MG CR tablet Take 1,300 mg by mouth every 8 (eight) hours as needed for pain.   aspirin 81 MG chewable tablet Chew 1 tablet (81 mg total) by mouth daily.   Calcium Carb-Cholecalciferol (CALCIUM 600+D) 600-800 MG-UNIT TABS Take 2 tablets by mouth daily.   cholecalciferol (VITAMIN D) 1000 UNITS tablet  Take 2,000 Units by mouth daily.    clopidogrel (PLAVIX) 75 MG tablet Take 1 tablet (75 mg total) by mouth daily.   denosumab (PROLIA) 60 MG/ML SOSY injection Inject 60 mg into the skin every 6 (six) months.   ferrous sulfate 325 (65 FE) MG tablet Take 325 mg by mouth daily with breakfast.   l-methylfolate-B6-B12 (METANX) 3-35-2 MG TABS tablet Take 1 tablet by mouth daily.   levothyroxine (SYNTHROID) 112 MCG tablet TAKE 1 TABLET BY MOUTH DAILY   loratadine (CLARITIN) 10 MG tablet Take 10 mg by mouth daily.   metoprolol succinate (TOPROL-XL) 25 MG 24 hr tablet TAKE 1/2 TABLET BY MOUTH DAILY   nitroGLYCERIN  (NITROSTAT) 0.4 MG SL tablet Place 1 tablet (0.4 mg total) under the tongue every 5 (five) minutes as needed for chest pain.   rosuvastatin (CRESTOR) 20 MG tablet TAKE 1 TABLET(20 MG) BY MOUTH DAILY   Study - [name] - olpasiran (AMG 890) 142 mg/mL or placebo SQ injection (PI-Hilty) Inject 142 mg into the skin once. For investigational use only - Inject 1 mL (1 prefilled syringe) subcutaneously into appropriate injection site per protocol. (Approved injection site(s): upper arm, upper thigh, abdomen). Please contact [facility] if any questions.   vitamin C (ASCORBIC ACID) 500 MG tablet Take 500 mg by mouth daily.   [DISCONTINUED] calcium carbonate (OS-CAL) 1250 (500 Ca) MG chewable tablet Chew 1 tablet (1,250 mg total) by mouth daily.   [DISCONTINUED] methocarbamol (ROBAXIN) 500 MG tablet Take 0.5 tablets (250 mg total) by mouth 2 (two) times daily as needed for muscle spasms.   No facility-administered encounter medications on file as of 09/29/2021.    Review of Systems:  Review of Systems  Constitutional:  Negative for activity change and appetite change.  HENT: Negative.    Respiratory:  Negative for cough and shortness of breath.   Cardiovascular:  Negative for leg swelling.  Gastrointestinal:  Negative for constipation.  Genitourinary: Negative.   Musculoskeletal:  Negative for arthralgias, gait problem and myalgias.  Skin: Negative.   Neurological:  Positive for weakness. Negative for dizziness.  Psychiatric/Behavioral:  Negative for confusion, dysphoric mood and sleep disturbance.     Health Maintenance  Topic Date Due   COVID-19 Vaccine (4 - Booster) 02/02/2022 (Originally 01/20/2021)   COLONOSCOPY (Pts 45-60yr Insurance coverage will need to be confirmed)  02/03/2026 (Originally 07/11/2020)   INFLUENZA VACCINE  10/05/2021   TETANUS/TDAP  05/30/2028   Pneumonia Vaccine 80 Years old  Completed   DEXA SCAN  Completed   Zoster Vaccines- Shingrix  Completed   HPV  VACCINES  Aged Out    Physical Exam: Vitals:   09/29/21 1402  BP: 111/74  Pulse: 60  Temp: (!) 97.1 F (36.2 C)  SpO2: 95%  Weight: 155 lb 1.6 oz (70.4 kg)  Height: '5\' 3"'$  (1.6 m)   Body mass index is 27.47 kg/m. Physical Exam Vitals reviewed.  Constitutional:      Appearance: Normal appearance.  HENT:     Head: Normocephalic.     Nose: Nose normal.     Mouth/Throat:     Mouth: Mucous membranes are moist.     Pharynx: Oropharynx is clear.  Eyes:     Pupils: Pupils are equal, round, and reactive to light.  Cardiovascular:     Rate and Rhythm: Normal rate and regular rhythm.     Pulses: Normal pulses.     Heart sounds: Normal heart sounds. No murmur heard. Pulmonary:  Effort: Pulmonary effort is normal.     Breath sounds: Normal breath sounds.  Abdominal:     General: Abdomen is flat. Bowel sounds are normal.     Palpations: Abdomen is soft.  Musculoskeletal:        General: No swelling.     Cervical back: Neck supple.  Skin:    General: Skin is warm.  Neurological:     General: No focal deficit present.     Mental Status: She is alert and oriented to person, place, and time.  Psychiatric:        Mood and Affect: Mood normal.        Thought Content: Thought content normal.     Labs reviewed: Basic Metabolic Panel: Recent Labs    02/08/21 1340 03/25/21 0810 05/06/21 1548 06/10/21 0810 09/23/21 0810  NA  --  139 146*  --  138  K  --  4.2 4.8  --  4.2  CL  --  107 107*  --  105  CO2  --  25 22  --  22  GLUCOSE  --  97 95  --  79  BUN  --  15 16  --  18  CREATININE  --  0.58* 0.59  --  0.61  CALCIUM  --  8.7 9.0  --  9.0  TSH 0.15*  --   --  1.86 1.81   Liver Function Tests: Recent Labs    03/16/21 0920 03/25/21 0810 09/23/21 0810  AST '14 27 16  '$ ALT '20 27 16  '$ ALKPHOS 59  --   --   BILITOT 0.4 0.5 0.5  PROT 6.1 6.1 6.1  ALBUMIN 4.2  --   --    No results for input(s): "LIPASE", "AMYLASE" in the last 8760 hours. No results for input(s):  "AMMONIA" in the last 8760 hours. CBC: Recent Labs    03/25/21 0810 05/06/21 1548 09/23/21 0810  WBC 6.1 8.7 7.5  NEUTROABS 3,264  --  4,650  HGB 13.1 13.0 13.3  HCT 39.0 38.5 38.7  MCV 96.3 97 94.6  PLT 221 265 245   Lipid Panel: Recent Labs    11/26/20 0810 03/16/21 0920 09/23/21 0810  CHOL 143 149 139  HDL 49* 56 49*  LDLCALC 74 70 72  TRIG 116 132 101  CHOLHDL 2.9 2.7 2.8   Lab Results  Component Value Date   HGBA1C 5.7 (H) 09/01/2020    Procedures since last visit: No results found.  Assessment/Plan 1. NSTEMI (non-ST elevated myocardial infarction) ([place]) On Plavix Aspirin and Statin Also on Low Dose of Beta Blockers  2. Mixed hyperlipidemia On Statin   3. Acquired hypothyroidism TSH normal   4. Senile osteoporosis On Prolia DEXA 6/22  5. Right hip pain Tylenol PRn  Mild cognitive impairment MMSE  is 25/30 in oast Recall is good. Passed her Clock drawing Doing well  Vitamin B 12 deficiency On Supplement Umbilical hernia without obstruction and without gangrene No surgery Iron deficiency Hgb Normal But says Iron helps with Fatigue   Refuses Mammogram No Family history of Breast cancer    Labs/tests ordered:  * No order type specified * Next appt:  10/21/2021

## 2021-10-12 DIAGNOSIS — L900 Lichen sclerosus et atrophicus: Secondary | ICD-10-CM | POA: Diagnosis not present

## 2021-10-16 ENCOUNTER — Other Ambulatory Visit: Payer: Self-pay | Admitting: Cardiovascular Disease

## 2021-10-21 ENCOUNTER — Ambulatory Visit (INDEPENDENT_AMBULATORY_CARE_PROVIDER_SITE_OTHER): Payer: Medicare Other | Admitting: *Deleted

## 2021-10-21 DIAGNOSIS — M810 Age-related osteoporosis without current pathological fracture: Secondary | ICD-10-CM | POA: Diagnosis not present

## 2021-10-21 MED ORDER — DENOSUMAB 60 MG/ML ~~LOC~~ SOSY
60.0000 mg | PREFILLED_SYRINGE | Freq: Once | SUBCUTANEOUS | Status: AC
  Administered 2021-10-21: 60 mg via SUBCUTANEOUS

## 2021-11-02 DIAGNOSIS — L821 Other seborrheic keratosis: Secondary | ICD-10-CM | POA: Diagnosis not present

## 2021-11-02 DIAGNOSIS — D225 Melanocytic nevi of trunk: Secondary | ICD-10-CM | POA: Diagnosis not present

## 2021-11-05 DIAGNOSIS — L602 Onychogryphosis: Secondary | ICD-10-CM | POA: Diagnosis not present

## 2021-11-05 DIAGNOSIS — M79672 Pain in left foot: Secondary | ICD-10-CM | POA: Diagnosis not present

## 2021-11-05 DIAGNOSIS — M79671 Pain in right foot: Secondary | ICD-10-CM | POA: Diagnosis not present

## 2021-11-12 DIAGNOSIS — Z23 Encounter for immunization: Secondary | ICD-10-CM | POA: Diagnosis not present

## 2021-11-22 ENCOUNTER — Other Ambulatory Visit: Payer: Self-pay | Admitting: Cardiovascular Disease

## 2021-11-24 VITALS — BP 131/73 | HR 54 | Wt 155.4 lb

## 2021-11-24 DIAGNOSIS — Z006 Encounter for examination for normal comparison and control in clinical research program: Secondary | ICD-10-CM

## 2021-11-24 MED ORDER — STUDY - OCEAN(A) - OLPASIRAN (AMG 890) 142 MG/ML OR PLACEBO SQ INJECTION (PI-HILTY)
142.0000 mg | PREFILLED_SYRINGE | Freq: Once | SUBCUTANEOUS | Status: AC
  Administered 2021-11-24: 142 mg via SUBCUTANEOUS
  Filled 2021-11-24: qty 1

## 2021-11-24 NOTE — Research (Cosign Needed Addendum)
Patient was seen in the research clinic for Week 24 of [name] trial. Denies any hospitalization or adverse events since her last visit. All concomitant medications reviewed and no changes noted at this time. Lab work and ECG completed per protocol. Dr Debara Pickett examined patient and answered all questions.Patient did state that she is having waves of fatigue and wasn't able to walk more than her usual mile this past week. She doesn't feel like it is related to the IP but just wanted to talk to Dr Debara Pickett about it. She does have a history of low iron and does take a supplement daily. Denies dizziness, shortness of breath and chest pain. Dr Debara Pickett recommended her to follow up with her PCP since the fatigue could be coming from a multitude of things and her EKG is normal. Patient verbalized understanding.. She does follow up with her cardiologist next month. Next appointment scheduled for Dec 13 @ 0830.  PK drawn at 1120.  [name] IP ADMINISTRATION Subject [id number] IP Box #: [id number] IP [id number] Time Given: [id number] Administration site: left arm  Current Outpatient Medications:    acetaminophen (TYLENOL) 650 MG CR tablet, Take 1,300 mg by mouth every 8 (eight) hours as needed for pain., Disp: , Rfl:    aspirin 81 MG chewable tablet, Chew 1 tablet (81 mg total) by mouth daily., Disp: 90 tablet, Rfl: 2   Calcium Carb-Cholecalciferol (CALCIUM 600+D) 600-800 MG-UNIT TABS, Take 2 tablets by mouth daily., Disp: , Rfl:    cholecalciferol (VITAMIN D) 1000 UNITS tablet, Take 2,000 Units by mouth daily. , Disp: , Rfl:    clopidogrel (PLAVIX) 75 MG tablet, Take 1 tablet (75 mg total) by mouth daily., Disp: 90 tablet, Rfl: 3   denosumab (PROLIA) 60 MG/ML SOSY injection, Inject 60 mg into the skin every 6 (six) months., Disp: , Rfl:    ferrous sulfate 325 (65 FE) MG tablet, Take 325 mg by mouth daily with breakfast., Disp: , Rfl:    l-methylfolate-B6-B12 (METANX) 3-35-2 MG TABS tablet, Take 1 tablet by mouth  daily., Disp: , Rfl:    levothyroxine (SYNTHROID) 112 MCG tablet, TAKE 1 TABLET BY MOUTH DAILY, Disp: 90 tablet, Rfl: 3   loratadine (CLARITIN) 10 MG tablet, Take 10 mg by mouth daily., Disp: , Rfl:    metoprolol succinate (TOPROL-XL) 25 MG 24 hr tablet, TAKE 1/2 TABLET BY MOUTH DAILY, Disp: 45 tablet, Rfl: 0   nitroGLYCERIN (NITROSTAT) 0.4 MG SL tablet, Place 1 tablet (0.4 mg total) under the tongue every 5 (five) minutes as needed for chest pain., Disp: 25 tablet, Rfl: 2   rosuvastatin (CRESTOR) 20 MG tablet, TAKE 1 TABLET(20 MG) BY MOUTH DAILY, Disp: 30 tablet, Rfl: 7   Study - [name] - olpasiran (AMG 890) 142 mg/mL or placebo SQ injection (PI-Hilty), Inject 142 mg into the skin once. For investigational use only - Inject 1 mL (1 prefilled syringe) subcutaneously into appropriate injection site per protocol. (Approved injection site(s): upper arm, upper thigh, abdomen). Please contact [facility] if any questions., Disp: , Rfl:    vitamin C (ASCORBIC ACID) 500 MG tablet, Take 500 mg by mouth daily., Disp: , Rfl:    General appearance: alert and no distress Neck: no carotid bruit, no JVD, and thyroid not enlarged, symmetric, no tenderness/mass/nodules Lungs: clear to auscultation bilaterally Heart: regular rate and rhythm, S1, S2 normal, no murmur, click, rub or gallop Abdomen: soft, non-tender; bowel sounds normal; no masses,  no organomegaly Extremities: extremities normal, atraumatic, no cyanosis or edema  Pulses: 2+ and symmetric Skin: Skin color, texture, turgor normal. No rashes or lesions Neurologic: Grossly normal Psych: Pleasant Pixie Casino, MD, FACC, [place] Director of the Advanced Lipid Disorders &  Cardiovascular Risk Reduction Clinic Diplomate of the American Board of Clinical Lipidology Attending Cardiologist  Direct Dial: [phone]  Fax: [phone]  Website:  [email or web address]

## 2021-11-25 ENCOUNTER — Other Ambulatory Visit: Payer: Self-pay | Admitting: Cardiovascular Disease

## 2021-11-25 NOTE — Telephone Encounter (Signed)
*  STAT* If patient is at the pharmacy, call can be transferred to refill team.   1. Which medications need to be refilled? (please list name of each medication and dose if known) nitroGLYCERIN (NITROSTAT) 0.4 MG SL tablet  2. Which pharmacy/location (including street and city if local pharmacy) is medication to be sent to? [id number] - [place], [address]  3. Do they need a 30 day or 90 day supply? 30 day  Patient missed placed them, she is out of the medication.

## 2021-11-29 NOTE — Research (Addendum)
[name] lab results week 24 [id number]          ABNORMAL LABS: TEST RESULT  MCV 101            Are these clinically significant? '[]'$  YES              '[x]'$  NO   Pixie Casino, MD, [facility], [place] Director of the Advanced Lipid Disorders &  Cardiovascular Risk Reduction Clinic Diplomate of the American Board of Clinical Lipidology Attending Cardiologist  Direct Dial: [phone]  Fax: [phone]  Website:  [email or web address]

## 2021-12-01 ENCOUNTER — Telehealth: Payer: Self-pay

## 2021-12-01 NOTE — Telephone Encounter (Signed)
Refill request received from [facility] for Metanx methylcobal/levomefolate-pyridoxal phos 3/35/2 mg tablet Alfasigma.  I called patient to verify who her PCP is as there is another provider listed on her chart. Message was left for her to call office.

## 2021-12-07 ENCOUNTER — Other Ambulatory Visit: Payer: Self-pay | Admitting: *Deleted

## 2021-12-07 MED ORDER — L-METHYLFOLATE-B6-B12 3-35-2 MG PO TABS: 1.0000 | ORAL_TABLET | Freq: Every day | ORAL | 1 refills | Status: DC

## 2021-12-07 NOTE — Telephone Encounter (Signed)
Patient called requesting refill to be faxed to [facility] [phone].  Pended Rx and sent to Amy for approval.

## 2021-12-08 DIAGNOSIS — D235 Other benign neoplasm of skin of trunk: Secondary | ICD-10-CM | POA: Diagnosis not present

## 2021-12-08 DIAGNOSIS — D225 Melanocytic nevi of trunk: Secondary | ICD-10-CM | POA: Diagnosis not present

## 2021-12-08 DIAGNOSIS — L570 Actinic keratosis: Secondary | ICD-10-CM | POA: Diagnosis not present

## 2021-12-08 DIAGNOSIS — D2272 Melanocytic nevi of left lower limb, including hip: Secondary | ICD-10-CM | POA: Diagnosis not present

## 2021-12-08 DIAGNOSIS — L821 Other seborrheic keratosis: Secondary | ICD-10-CM | POA: Diagnosis not present

## 2021-12-08 DIAGNOSIS — D2262 Melanocytic nevi of left upper limb, including shoulder: Secondary | ICD-10-CM | POA: Diagnosis not present

## 2021-12-08 DIAGNOSIS — D1801 Hemangioma of skin and subcutaneous tissue: Secondary | ICD-10-CM | POA: Diagnosis not present

## 2021-12-09 ENCOUNTER — Telehealth: Payer: Self-pay | Admitting: Cardiovascular Disease

## 2021-12-09 NOTE — Telephone Encounter (Signed)
Patient called wanting to know if there any reason why she shouldn't get the COVID booster vaccine.  She has an appt schedule for tomorrow afternoon to get it.  She said a message can be left on her voice mail.  Please advise.

## 2021-12-10 DIAGNOSIS — Z23 Encounter for immunization: Secondary | ICD-10-CM | POA: Diagnosis not present

## 2021-12-10 NOTE — Telephone Encounter (Signed)
error 

## 2021-12-10 NOTE — Telephone Encounter (Signed)
Patient called to follow-up on if it will be OK for her to get the COVID shot.  Patient noted she has an appointment today (10/6) at 3pm.

## 2021-12-10 NOTE — Telephone Encounter (Signed)
Returned call and spoke to patient to let her know that Dr Burt Knack says the choice to get the [place] booster is completely up to her, he doesn't have a general recommendation one way or the other. Patient appreciates call, states she will get it.

## 2021-12-13 ENCOUNTER — Ambulatory Visit: Payer: Medicare Other | Attending: Cardiovascular Disease | Admitting: Cardiovascular Disease

## 2021-12-13 ENCOUNTER — Encounter: Payer: Self-pay | Admitting: Cardiovascular Disease

## 2021-12-13 VITALS — BP 100/70 | HR 71 | Ht 63.0 in | Wt 156.2 lb

## 2021-12-13 DIAGNOSIS — I2510 Atherosclerotic heart disease of native coronary artery without angina pectoris: Secondary | ICD-10-CM | POA: Diagnosis not present

## 2021-12-13 DIAGNOSIS — F3289 Other specified depressive episodes: Secondary | ICD-10-CM | POA: Diagnosis not present

## 2021-12-13 DIAGNOSIS — E782 Mixed hyperlipidemia: Secondary | ICD-10-CM | POA: Insufficient documentation

## 2021-12-13 DIAGNOSIS — I255 Ischemic cardiomyopathy: Secondary | ICD-10-CM | POA: Insufficient documentation

## 2021-12-13 NOTE — Progress Notes (Signed)
Cardiology Office Note:    Date:  12/13/2021   ID:  Jessica [name], DOB April 21, 1940, MRN [id number]  PCP:  Virgie Dad, MD   [place] Providers Cardiologist:  Sherren Mocha, MD     Referring MD: Virgie Dad, MD   Chief Complaint  Patient presents with   Coronary Artery Disease    History of Present Illness:    Jessica [name] is a 81 y.o. female with a hx of coronary artery disease, presenting for follow-up evaluation.  Patient has a remote history of pericarditis in 2000.  Other medical problems include hyperlipidemia and hypothyroidism.  She presented with a non-ST elevation infarction in June 2022 and was found to have subtotal occlusion of the LAD treated with a drug-eluting stent.  She initially had an LVEF in the 40 to 45% range but initially improved to an LVEF of 50 to 55% on follow-up echo assessment, then improved further on an echo in March of this year demonstrating an LVEF of 55 to 60% with no regional wall motion abnormalities.  The patient is here alone today.  She denies any symptoms of chest pain or chest pressure.  She does have some shortness of breath at times with activity.  She has no orthopnea, PND, or leg swelling.  Her biggest complaint is generalized fatigue and exercise intolerance.  She has had no lightheadedness or syncope.  She continues to deal with some difficult family issues and feels like depression is a big part of her overall symptom complex.  She asks about starting an antidepressant today.  Past Medical History:  Diagnosis Date   Anemia 12/2015   Bronchitis, chronic (HCC)    in her 86s   Cataract    Chronic kidney disease    in 20s had nephritis   Closed fracture of right distal humerus    right   Coronary artery disease    Hearing loss 05/03/2016   High cholesterol    Hypothyroidism    Osteopenia    Pericarditis 01/1999   Pneumothorax 01/1999   Thyroid disorder    Vitamin B 12 deficiency     Past Surgical  History:  Procedure Laterality Date   birthmark      removed,left leg   CARDIAC CATHETERIZATION     CATARACT EXTRACTION  2017   CORONARY STENT INTERVENTION N/A 09/01/2020   Procedure: CORONARY STENT INTERVENTION;  Surgeon: Belva Crome, MD;  Location: [facility];  Service: Cardiovascular;  Laterality: N/A;   LEFT HEART CATH AND CORONARY ANGIOGRAPHY N/A 09/01/2020   Procedure: LEFT HEART CATH AND CORONARY ANGIOGRAPHY;  Surgeon: Belva Crome, MD;  Location: [facility];  Service: Cardiovascular;  Laterality: N/A;   ORIF HUMERUS FRACTURE Right 04/10/2019   Procedure: RIGHT OPEN REDUCTION INTERNAL FIXATION (ORIF) DISTAL HUMERUS FRACTURE WITH EXTENSION;  Surgeon: Hiram Gash, MD;  Location: [facility];  Service: Orthopedics;  Laterality: Right;   [place]?    Current Medications: Current Meds  Medication Sig   acetaminophen (TYLENOL) 650 MG CR tablet Take 1,300 mg by mouth every 8 (eight) hours as needed for pain.   Calcium Carb-Cholecalciferol (CALCIUM 600+D) 600-800 MG-UNIT TABS Take 2 tablets by mouth daily.   cholecalciferol (VITAMIN D) 1000 UNITS tablet Take 2,000 Units by mouth daily.    clopidogrel (PLAVIX) 75 MG tablet Take 1 tablet (75 mg total) by mouth daily.   denosumab (PROLIA) 60 MG/ML SOSY injection Inject 60  mg into the skin every 6 (six) months.   ferrous sulfate 325 (65 FE) MG tablet Take 325 mg by mouth daily with breakfast.   l-methylfolate-B6-B12 (METANX) 3-35-2 MG TABS tablet Take 1 tablet by mouth daily.   levothyroxine (SYNTHROID) 112 MCG tablet TAKE 1 TABLET BY MOUTH DAILY   loratadine (CLARITIN) 10 MG tablet Take 10 mg by mouth as needed.   nitroGLYCERIN (NITROSTAT) 0.4 MG SL tablet TAKE 1 TABLET UNDER THE TONGUE EVERY 5 MINUTES AS NEEDED FOR CHEST PAIN   rosuvastatin (CRESTOR) 20 MG tablet TAKE 1 TABLET(20 MG) BY MOUTH DAILY   Study - [name] - olpasiran (AMG 890) 142 mg/mL or placebo SQ injection  (PI-Hilty) Inject 142 mg into the skin once. For investigational use only - Inject 1 mL (1 prefilled syringe) subcutaneously into appropriate injection site per protocol. (Approved injection site(s): upper arm, upper thigh, abdomen). Please contact [facility] if any questions.   vitamin C (ASCORBIC ACID) 500 MG tablet Take 500 mg by mouth daily.   [DISCONTINUED] aspirin 81 MG chewable tablet Chew 1 tablet (81 mg total) by mouth daily.   [DISCONTINUED] metoprolol succinate (TOPROL-XL) 25 MG 24 hr tablet TAKE 1/2 TABLET BY MOUTH DAILY     Allergies:   Atorvastatin   Social History   Socioeconomic History   Marital status: Divorced    Spouse name: Not on file   Number of children: 1   Years of education: college   Highest education level: Not on file  Occupational History   Occupation: retired  Tobacco Use   Smoking status: Never   Smokeless tobacco: Never  Vaping Use   Vaping Use: Never used  Substance and Sexual Activity   Alcohol use: Yes    Alcohol/week: 3.0 standard drinks of alcohol    Types: 3 Glasses of wine per week    Comment: social   Drug use: No   Sexual activity: Not on file  Other Topics Concern   Not on file  Social History Narrative   Moved to [facility] 01/14/2015   Do you drink/eat things with caffeine?  yes   Marital status?      divorced                               Do you live in a house, apartment, assisted living, condo, trailer, etc.?  Retirement community   Is it one or more stories? Yes, 3   How many persons live in your home?  1   Do you have any pets in your home? (please list) 0   Current or past profession:  Librarian   Do you exercise?     yes                                 Type & how often? Walk-varies   Do you have a living will? yes   Do you have a DNR form?         no                         If not, do you want to discuss one? yes   Do you have signed POA/HPOA for forms? yes   Social Determinants of Health    Financial Resource Strain: Low Risk  (03/29/2017)   Overall Financial Resource Strain (CARDIA)  Difficulty of Paying Living Expenses: Not hard at all  Food Insecurity: No Food Insecurity (03/29/2017)   Hunger Vital Sign    Worried About Running Out of Food in the Last Year: Never true    Ran Out of Food in the Last Year: Never true  Transportation Needs: No Transportation Needs (03/29/2017)   PRAPARE - Hydrologist (Medical): No    Lack of Transportation (Non-Medical): No  Physical Activity: Insufficiently Active (03/29/2017)   Exercise Vital Sign    Days of Exercise per Week: 4 days    Minutes of Exercise per Session: 30 min  Stress: No Stress Concern Present (03/29/2017)   [place]    Feeling of Stress : Only a little  Social Connections: Moderately Integrated (03/29/2017)   Social Connection and Isolation Panel [NHANES]    Frequency of Communication with Friends and Family: Three times a week    Frequency of Social Gatherings with Friends and Family: More than three times a week    Attends Religious Services: More than 4 times per year    Active Member of [employer] or Organizations: Yes    Attends Archivist Meetings: Never    Marital Status: Widowed     Family History: The patient's family history includes Arthritis in her paternal grandmother; Asthma in her mother; Ataxia in her father; Cancer - Cervical in her sister; Diabetes in her maternal grandmother, mother, and another family member; Stroke in her father; Thyroid disease in her sister. There is no history of Colon cancer.  ROS:   Please see the history of present illness.    All other systems reviewed and are negative.  EKGs/Labs/Other Studies Reviewed:    The following studies were reviewed today: 2D echocardiogram 05/14/2021:  1. Left ventricular ejection fraction, by estimation, is 55 to 60%. The  left ventricle  has normal function. The left ventricle has no regional  wall motion abnormalities. There is mild concentric left ventricular  hypertrophy. Left ventricular diastolic  parameters were normal.   2. Right ventricular systolic function is normal. The right ventricular  size is normal.   3. The mitral valve is normal in structure. No evidence of mitral valve  regurgitation. No evidence of mitral stenosis.   4. Tricuspid valve regurgitation is mild to moderate.   5. The aortic valve is normal in structure. Aortic valve regurgitation is  not visualized. No aortic stenosis is present.   6. The inferior vena cava is normal in size with greater than 50%  respiratory variability, suggesting right atrial pressure of 3 mmHg.   Comparison(s): A prior study was performed on 10/07/2020. Changes from prior  study are noted. LVEF low normal on prior study, now LVEF is normal.   EKG:  EKG is not ordered today.   Recent Labs: 05/06/2021: NT-Pro BNP 93 09/23/2021: ALT 16; BUN 18; Creat 0.61; Hemoglobin 13.3; Platelets 245; Potassium 4.2; Sodium 138; TSH 1.81  Recent Lipid Panel    Component Value Date/Time   CHOL 139 09/23/2021 0810   CHOL 149 03/16/2021 0920   TRIG 101 09/23/2021 0810   HDL 49 (L) 09/23/2021 0810   HDL 56 03/16/2021 0920   CHOLHDL 2.8 09/23/2021 0810   VLDL 18 02/24/2014 0926   LDLCALC 72 09/23/2021 0810   LDLDIRECT 135.4 12/26/2006 0847     Risk Assessment/Calculations:                Physical Exam:  VS:  BP 100/70   Pulse 71   Ht '5\' 3"'$  (1.6 m)   Wt 156 lb 3.2 oz (70.9 kg)   SpO2 97%   BMI 27.67 kg/m     Wt Readings from Last 3 Encounters:  12/13/21 156 lb 3.2 oz (70.9 kg)  11/24/21 155 lb 6.4 oz (70.5 kg)  09/29/21 155 lb 1.6 oz (70.4 kg)     GEN:  Well nourished, well developed in no acute distress HEENT: Normal NECK: No JVD; No carotid bruits LYMPHATICS: No lymphadenopathy CARDIAC: RRR, no murmurs, rubs, gallops RESPIRATORY:  Clear to auscultation  without rales, wheezing or rhonchi  ABDOMEN: Soft, non-tender, non-distended MUSCULOSKELETAL:  No edema; No deformity  SKIN: Warm and dry NEUROLOGIC:  Alert and oriented x 3 PSYCHIATRIC:  Normal affect   ASSESSMENT:    1. Coronary artery disease involving native coronary artery of native heart without angina pectoris   2. Ischemic cardiomyopathy   3. Mixed hyperlipidemia   4. Other depression    PLAN:    In order of problems listed above:  The patient is clinically stable now greater than 1 year out from her non-STEMI.  She has no symptoms of angina.  Considering her bleeding risk balanced with her ongoing ischemic risk, I have recommended that she discontinue aspirin and remain on clopidogrel monotherapy.  I am also going to discontinue the low-dose of metoprolol succinate since fatigue is such a big part of her residual symptom complex.  No other changes are made in her medical regimen today. LVEF has completely normalized after revascularization with medical therapy.  Continue clopidogrel and a statin drug. Treated with rosuvastatin.  Patient is enrolled in the ocean a trial for patients with elevated LP(a).  Last LDL cholesterol was 72 mg/dL. Discussed at length with the patient.  I am reaching out to her primary care physician to see if she will be a candidate for antidepressant therapy and/or counseling.           Medication Adjustments/Labs and Tests Ordered: Current medicines are reviewed at length with the patient today.  Concerns regarding medicines are outlined above.  No orders of the defined types were placed in this encounter.  No orders of the defined types were placed in this encounter.   Patient Instructions  Medication Instructions:  STOP Aspirin STOP Metoprolol *If you need a refill on your cardiac medications before your next appointment, please call your pharmacy*   Lab Work: NONE If you have labs (blood work) drawn today and your tests are completely  normal, you will receive your results only by: [place] (if you have MyChart) OR A paper copy in the mail If you have any lab test that is abnormal or we need to change your treatment, we will call you to review the results.   Testing/Procedures: NONE   Follow-Up: At [facility], you and your health needs are our priority.  As part of our continuing mission to provide you with exceptional heart care, we have created designated Provider Care Teams.  These Care Teams include your primary Cardiologist (physician) and Advanced Practice Providers (APPs -  Physician Assistants and Nurse Practitioners) who all work together to provide you with the care you need, when you need it.  Your next appointment:   6 month(s)  The format for your next appointment:   In Person  Provider:   Sherren Mocha, MD       Important Information About Sugar  Signed, Sherren Mocha, MD  12/13/2021 9:31 AM    [place]

## 2021-12-13 NOTE — Patient Instructions (Signed)
Medication Instructions:  STOP Aspirin STOP Metoprolol *If you need a refill on your cardiac medications before your next appointment, please call your pharmacy*   Lab Work: NONE If you have labs (blood work) drawn today and your tests are completely normal, you will receive your results only by: [place] (if you have MyChart) OR A paper copy in the mail If you have any lab test that is abnormal or we need to change your treatment, we will call you to review the results.   Testing/Procedures: NONE   Follow-Up: At [facility], you and your health needs are our priority.  As part of our continuing mission to provide you with exceptional heart care, we have created designated Provider Care Teams.  These Care Teams include your primary Cardiologist (physician) and Advanced Practice Providers (APPs -  Physician Assistants and Nurse Practitioners) who all work together to provide you with the care you need, when you need it.  Your next appointment:   6 month(s)  The format for your next appointment:   In Person  Provider:   Sherren Mocha, MD       Important Information About Sugar

## 2021-12-14 ENCOUNTER — Telehealth: Payer: Self-pay

## 2021-12-14 NOTE — Telephone Encounter (Signed)
Please make her appointment with me. Thanks

## 2021-12-14 NOTE — Telephone Encounter (Signed)
Dr. Sherren Mocha called and just wanted to inform you that patient relayed to him that she is interested in starting on an antidepressant.   Message routed to Dr. Veleta Miners

## 2021-12-14 NOTE — Telephone Encounter (Signed)
Patient has been scheduled in [place] for 10/18

## 2021-12-22 ENCOUNTER — Non-Acute Institutional Stay: Payer: Medicare Other | Admitting: Internal Medicine

## 2021-12-22 ENCOUNTER — Encounter: Payer: Self-pay | Admitting: Internal Medicine

## 2021-12-22 VITALS — BP 132/74 | HR 63 | Temp 97.8°F | Resp 18 | Ht 63.0 in | Wt 156.0 lb

## 2021-12-22 DIAGNOSIS — R5383 Other fatigue: Secondary | ICD-10-CM | POA: Diagnosis not present

## 2021-12-22 DIAGNOSIS — F329 Major depressive disorder, single episode, unspecified: Secondary | ICD-10-CM

## 2021-12-22 MED ORDER — ESCITALOPRAM OXALATE 5 MG PO TABS: 5.0000 mg | ORAL_TABLET | Freq: Every day | ORAL | 3 refills | Status: DC

## 2021-12-22 NOTE — Progress Notes (Signed)
Location: [facility] (12)  Provider:   Code Status:  Goals of Care:     12/22/2021    8:24 AM  Advanced Directives  Does Patient Have a Medical Advance Directive? Yes  Type of Paramedic of [place];Living will  Copy of [place] in Chart? Yes - validated most recent copy scanned in chart (See row information)     Chief Complaint  Patient presents with   Acute Visit    Requesting an Antidepressant.     HPI: Patient is a 81 y.o. female seen today for an acute visit for Depression and Anxiety   Patient h/o  NSTEMI 06/22 cardiac cath which showed subtotal occlusion of an LAD.  Underwent successful angioplasty and DES.  Does have residual 50% RCA lesion Her EF was 50-55% with No Wall motion Abnormalities now  H/o umbilical hernia Surgery said no issues History of osteoporosis  Hyperlipidemia, hypothyroidism,  history of peripheral neuropathy  history of shortness of breath with exertion  Has been c/o Feeling fatigue since her NSTEMI Has been through Cardiac therapy  Saw Dr Copper and was c/o depression which can be contributing to her fatigue  Today she wanted to talk about her anxiety and depression She is having some issues with her son and Daughter in law who live in [place]  Still hesitant for counseling and Antidepressant She is also going on the trip to [facility] and does not want to start new Med  Past Medical History:  Diagnosis Date   Anemia 12/2015   Bronchitis, chronic ([place])    in her 33s   Cataract    Chronic kidney disease    in 20s had nephritis   Closed fracture of right distal humerus    right   Coronary artery disease    Hearing loss 05/03/2016   High cholesterol    Hypothyroidism    Osteopenia    Pericarditis 01/1999   Pneumothorax 01/1999   Thyroid disorder    Vitamin B 12 deficiency     Past Surgical History:  Procedure Laterality Date   birthmark       removed,left leg   CARDIAC CATHETERIZATION     CATARACT EXTRACTION  2017   CORONARY STENT INTERVENTION N/A 09/01/2020   Procedure: CORONARY STENT INTERVENTION;  Surgeon: Belva Crome, MD;  Location: [place] [facility];  Service: Cardiovascular;  Laterality: N/A;   LEFT HEART CATH AND CORONARY ANGIOGRAPHY N/A 09/01/2020   Procedure: LEFT HEART CATH AND CORONARY ANGIOGRAPHY;  Surgeon: Belva Crome, MD;  Location: [place] [facility];  Service: Cardiovascular;  Laterality: N/A;   ORIF HUMERUS FRACTURE Right 04/10/2019   Procedure: RIGHT OPEN REDUCTION INTERNAL FIXATION (ORIF) DISTAL HUMERUS FRACTURE WITH EXTENSION;  Surgeon: Hiram Gash, MD;  Location: [place];  Service: Orthopedics;  Laterality: Right;   [place]?    Allergies  Allergen Reactions   Atorvastatin     Muscle weakness    Outpatient Encounter Medications as of 12/22/2021  Medication Sig   acetaminophen (TYLENOL) 650 MG CR tablet Take 1,300 mg by mouth every 8 (eight) hours as needed for pain.   Calcium Carb-Cholecalciferol (CALCIUM 600+D) 600-800 MG-UNIT TABS Take 2 tablets by mouth daily.   cholecalciferol (VITAMIN D) 1000 UNITS tablet Take 2,000 Units by mouth daily.    clopidogrel (PLAVIX) 75 MG tablet Take 1 tablet (75 mg total) by mouth daily.  denosumab (PROLIA) 60 MG/ML SOSY injection Inject 60 mg into the skin every 6 (six) months.   escitalopram (LEXAPRO) 5 MG tablet Take 1 tablet (5 mg total) by mouth daily.   ferrous sulfate 325 (65 FE) MG tablet Take 325 mg by mouth daily with breakfast.   l-methylfolate-B6-B12 (METANX) 3-35-2 MG TABS tablet Take 1 tablet by mouth daily.   levothyroxine (SYNTHROID) 112 MCG tablet TAKE 1 TABLET BY MOUTH DAILY   loratadine (CLARITIN) 10 MG tablet Take 10 mg by mouth as needed.   nitroGLYCERIN (NITROSTAT) 0.4 MG SL tablet TAKE 1 TABLET UNDER THE TONGUE EVERY 5 MINUTES AS NEEDED FOR CHEST PAIN   rosuvastatin (CRESTOR) 20 MG  tablet TAKE 1 TABLET(20 MG) BY MOUTH DAILY   Study - [name] - olpasiran (AMG 890) 142 mg/mL or placebo SQ injection (PI-Hilty) Inject 142 mg into the skin once. For investigational use only - Inject 1 mL (1 prefilled syringe) subcutaneously into appropriate injection site per protocol. (Approved injection site(s): upper arm, upper thigh, abdomen). Please contact [facility] if any questions.   vitamin C (ASCORBIC ACID) 500 MG tablet Take 500 mg by mouth daily.   [DISCONTINUED] calcium carbonate (OS-CAL) 1250 (500 Ca) MG chewable tablet Chew 1 tablet (1,250 mg total) by mouth daily.   No facility-administered encounter medications on file as of 12/22/2021.    Review of Systems:  Review of Systems  Constitutional:  Negative for activity change and appetite change.  HENT: Negative.    Respiratory:  Negative for cough and shortness of breath.   Cardiovascular:  Negative for leg swelling.  Gastrointestinal:  Negative for constipation.  Genitourinary: Negative.   Musculoskeletal:  Negative for arthralgias, gait problem and myalgias.  Skin: Negative.   Neurological:  Negative for dizziness and weakness.  Psychiatric/Behavioral:  Positive for dysphoric mood. Negative for confusion and sleep disturbance. The patient is nervous/anxious.     Health Maintenance  Topic Date Due   INFLUENZA VACCINE  10/05/2021   COVID-19 Vaccine (4 - Mixed Product risk series) 02/02/2022 (Originally 01/20/2021)   COLONOSCOPY (Pts 45-24yr Insurance coverage will need to be confirmed)  02/03/2026 (Originally 07/11/2020)   TETANUS/TDAP  05/30/2028   Pneumonia Vaccine 80 Years old  Completed   DEXA SCAN  Completed   Zoster Vaccines- Shingrix  Completed   HPV VACCINES  Aged Out    Physical Exam: Vitals:   12/22/21 0820  BP: 132/74  Pulse: 63  Resp: 18  Temp: 97.8 F (36.6 C)  TempSrc: Temporal  SpO2: 99%  Weight: 156 lb (70.8 kg)  Height: '5\' 3"'$  (1.6 m)   Body mass index is 27.63  kg/m. Physical Exam Vitals reviewed.  Constitutional:      Appearance: Normal appearance.  HENT:     Head: Normocephalic.     Nose: Nose normal.     Mouth/Throat:     Mouth: Mucous membranes are moist.     Pharynx: Oropharynx is clear.  Eyes:     Pupils: Pupils are equal, round, and reactive to light.  Cardiovascular:     Rate and Rhythm: Normal rate and regular rhythm.     Pulses: Normal pulses.     Heart sounds: Normal heart sounds. No murmur heard. Pulmonary:     Effort: Pulmonary effort is normal.     Breath sounds: Normal breath sounds.  Abdominal:     General: Abdomen is flat. Bowel sounds are normal.     Palpations: Abdomen is soft.  Musculoskeletal:  General: No swelling.     Cervical back: Neck supple.  Skin:    General: Skin is warm.  Neurological:     General: No focal deficit present.     Mental Status: She is alert and oriented to person, place, and time.  Psychiatric:        Mood and Affect: Mood normal.        Thought Content: Thought content normal.     Labs reviewed: Basic Metabolic Panel: Recent Labs    02/08/21 1340 03/25/21 0810 05/06/21 1548 06/10/21 0810 09/23/21 0810  NA  --  139 146*  --  138  K  --  4.2 4.8  --  4.2  CL  --  107 107*  --  105  CO2  --  25 22  --  22  GLUCOSE  --  97 95  --  79  BUN  --  15 16  --  18  CREATININE  --  0.58* 0.59  --  0.61  CALCIUM  --  8.7 9.0  --  9.0  TSH 0.15*  --   --  1.86 1.81   Liver Function Tests: Recent Labs    03/16/21 0920 03/25/21 0810 09/23/21 0810  AST '14 27 16  '$ ALT '20 27 16  '$ ALKPHOS 59  --   --   BILITOT 0.4 0.5 0.5  PROT 6.1 6.1 6.1  ALBUMIN 4.2  --   --    No results for input(s): "LIPASE", "AMYLASE" in the last 8760 hours. No results for input(s): "AMMONIA" in the last 8760 hours. CBC: Recent Labs    03/25/21 0810 05/06/21 1548 09/23/21 0810  WBC 6.1 8.7 7.5  NEUTROABS 3,264  --  4,650  HGB 13.1 13.0 13.3  HCT 39.0 38.5 38.7  MCV 96.3 97 94.6  PLT 221  265 245   Lipid Panel: Recent Labs    03/16/21 0920 09/23/21 0810  CHOL 149 139  HDL 56 49*  LDLCALC 70 72  TRIG 132 101  CHOLHDL 2.7 2.8   Lab Results  Component Value Date   HGBA1C 5.7 (H) 09/01/2020    Procedures since last visit: No results found.  Assessment/Plan 1. Other fatigue Will order this as she is going on the trip and want to make sure it is all stable - CBC with Differential/Platelet; Future - COMPLETE METABOLIC PANEL WITH GFR; Future  2. Reactive depression I have called in Lexapro We discussed the side effects  She does not want to start new meds before her Trip with [employer] She will think about starting it when she comes back. I will see her for follow up  Other problems  NSTEMI (non-ST elevated myocardial infarction) ([facility]) On Plavix Aspirin and Statin Off Beta Blocker now due ot low BP    Mixed hyperlipidemia On Statin     Acquired hypothyroidism TSH normal    Senile osteoporosis On Prolia DEXA 6/22   Right hip pain Tylenol PRn   Mild cognitive impairment MMSE  is 25/30 in oast Recall is good. Passed her Clock drawing Doing well  Vitamin B 12 deficiency On Supplement Umbilical hernia without obstruction and without gangrene No surgery Iron deficiency Hgb Normal But says Iron helps with Fatigue   Refuses Mammogram No Family history of Breast cancer    Labs/tests ordered:   Next appt:  12/30/2021 Total time spent in this patient care encounter was  45_  minutes; greater than 50% of the visit spent counseling patient  and staff, reviewing records , Labs and coordinating care for problems addressed at this encounter.

## 2021-12-30 ENCOUNTER — Other Ambulatory Visit: Payer: Medicare Other

## 2021-12-30 DIAGNOSIS — R5383 Other fatigue: Secondary | ICD-10-CM | POA: Diagnosis not present

## 2021-12-30 LAB — COMPLETE METABOLIC PANEL WITH GFR
AG Ratio: 2 (calc) (ref 1.0–2.5)
ALT: 14 U/L (ref 6–29)
AST: 16 U/L (ref 10–35)
Albumin: 4 g/dL (ref 3.6–5.1)
Alkaline phosphatase (APISO): 42 U/L (ref 37–153)
BUN: 20 mg/dL (ref 7–25)
CO2: 27 mmol/L (ref 20–32)
Calcium: 8.6 mg/dL (ref 8.6–10.4)
Chloride: 105 mmol/L (ref 98–110)
Creat: 0.64 mg/dL (ref 0.60–0.95)
Globulin: 2 g/dL (calc) (ref 1.9–3.7)
Glucose, Bld: 108 mg/dL — ABNORMAL HIGH (ref 65–99)
Potassium: 4 mmol/L (ref 3.5–5.3)
Sodium: 139 mmol/L (ref 135–146)
Total Bilirubin: 0.5 mg/dL (ref 0.2–1.2)
Total Protein: 6 g/dL — ABNORMAL LOW (ref 6.1–8.1)
eGFR: 89 mL/min/{1.73_m2} (ref >=60)

## 2021-12-30 LAB — CBC WITH DIFFERENTIAL/PLATELET
Absolute Monocytes: 585 cells/uL (ref 200–950)
Basophils Absolute: 40 cells/uL (ref 0–200)
Basophils Relative: 0.5 %
Eosinophils Absolute: 198 cells/uL (ref 15–500)
Eosinophils Relative: 2.5 %
HCT: 38.3 % (ref 35.0–45.0)
Hemoglobin: 13.4 g/dL (ref 11.7–15.5)
Lymphs Abs: 2433 cells/uL (ref 850–3900)
MCH: 33.3 pg — ABNORMAL HIGH (ref 27.0–33.0)
MCHC: 35 g/dL (ref 32.0–36.0)
MCV: 95 fL (ref 80.0–100.0)
MPV: 11 fL (ref 7.5–12.5)
Monocytes Relative: 7.4 %
Neutro Abs: 4645 cells/uL (ref 1500–7800)
Neutrophils Relative %: 58.8 %
Platelets: 253 10*3/uL (ref 140–400)
RBC: 4.03 10*6/uL (ref 3.80–5.10)
RDW: 12.4 % (ref 11.0–15.0)
Total Lymphocyte: 30.8 %
WBC: 7.9 10*3/uL (ref 3.8–10.8)

## 2022-01-12 ENCOUNTER — Telehealth: Payer: Self-pay

## 2022-01-12 NOTE — Telephone Encounter (Signed)
Per Care Gap notification patient is due for an Annual Wellness Visit   Left detailed message on voicemail requesting a return call to schedule an annual wellness visit with Dr.Gupta's NP, Amy Fargo. Amy is on sight at [facility] on Monday afternoon.

## 2022-01-22 DIAGNOSIS — B084 Enteroviral vesicular stomatitis with exanthem: Secondary | ICD-10-CM | POA: Diagnosis not present

## 2022-01-22 DIAGNOSIS — R21 Rash and other nonspecific skin eruption: Secondary | ICD-10-CM | POA: Diagnosis not present

## 2022-01-24 DIAGNOSIS — L900 Lichen sclerosus et atrophicus: Secondary | ICD-10-CM | POA: Diagnosis not present

## 2022-01-25 ENCOUNTER — Ambulatory Visit: Payer: Medicare Other | Admitting: Podiatry

## 2022-01-25 ENCOUNTER — Telehealth: Payer: Self-pay

## 2022-01-25 ENCOUNTER — Encounter: Payer: Self-pay | Admitting: Internal Medicine

## 2022-01-25 NOTE — Telephone Encounter (Signed)
Attempted to call patient to reschedule appt until next week.

## 2022-01-26 ENCOUNTER — Encounter: Payer: Self-pay | Admitting: Internal Medicine

## 2022-01-26 ENCOUNTER — Non-Acute Institutional Stay: Payer: Medicare Other | Admitting: Internal Medicine

## 2022-01-26 VITALS — BP 112/68 | HR 92 | Temp 97.8°F | Resp 18 | Ht 63.0 in | Wt 159.4 lb

## 2022-01-26 DIAGNOSIS — R739 Hyperglycemia, unspecified: Secondary | ICD-10-CM | POA: Diagnosis not present

## 2022-01-26 DIAGNOSIS — E611 Iron deficiency: Secondary | ICD-10-CM

## 2022-01-26 DIAGNOSIS — E782 Mixed hyperlipidemia: Secondary | ICD-10-CM

## 2022-01-26 DIAGNOSIS — F32A Depression, unspecified: Secondary | ICD-10-CM | POA: Diagnosis not present

## 2022-01-26 DIAGNOSIS — R200 Anesthesia of skin: Secondary | ICD-10-CM

## 2022-01-26 DIAGNOSIS — M810 Age-related osteoporosis without current pathological fracture: Secondary | ICD-10-CM

## 2022-01-26 DIAGNOSIS — E039 Hypothyroidism, unspecified: Secondary | ICD-10-CM

## 2022-01-26 DIAGNOSIS — F419 Anxiety disorder, unspecified: Secondary | ICD-10-CM | POA: Diagnosis not present

## 2022-01-30 NOTE — Progress Notes (Signed)
Location: [place] [facility] Service:  Clinic (12)  Provider:   Code Status:  Goals of Care:     01/26/2022    1:55 PM  Advanced Directives  Does Patient Have a Medical Advance Directive? Yes  Type of Paramedic of [place];Living will  Copy of [place] in Chart? Yes - validated most recent copy scanned in chart (See row information)     Chief Complaint  Patient presents with   Medical Management of Chronic Issues    4 month follow up.   Acute Visit    Patient states she would like to discuss depression medications, prediabetic and a few more oncerns    HPI: Patient is a 81 y.o. female seen today for an acute visit for Follow up  Acute issues Depression with anxiety Was prescribed Lexapro.  But patient never took it.  She feels much better since she had conversation with her son.  At this time she is does not want to see a psychologist or therapist Hand and Mouth disease Developed rash in her hand which are now healing.  She states that she [place].  Wanted me to know that she has restarted her vitamin B12 due to some issues with neuropathy and numbness.  And it seems to help Elevated Fasting Sugars She also noticed in my chart that her CBG was 108 and wants to be screened for diabetes.  Otherwise no new issues  Patient h/o  NSTEMI 06/22 cardiac cath which showed subtotal occlusion of an LAD.  Underwent successful angioplasty and DES.  Does have residual 50% RCA lesion Her EF was 50-55% with No Wall motion Abnormalities now   H/o umbilical hernia Surgery said no issues History of osteoporosis  Hyperlipidemia, hypothyroidism,  history of peripheral neuropathy  history of shortness of breath with exertion  Past Medical History:  Diagnosis Date   Anemia 12/2015   Bronchitis, chronic (HCC)    in her 23s   Cataract    Chronic kidney disease    in 20s had nephritis   Closed fracture of  right distal humerus    right   Coronary artery disease    Hand, foot and mouth disease (HFMD)    Hearing loss 05/03/2016   High cholesterol    Hypothyroidism    Osteopenia    Pericarditis 01/1999   Pneumothorax 01/1999   Thyroid disorder    Vitamin B 12 deficiency     Past Surgical History:  Procedure Laterality Date   birthmark      removed,left leg   CARDIAC CATHETERIZATION     CATARACT EXTRACTION  2017   CORONARY STENT INTERVENTION N/A 09/01/2020   Procedure: CORONARY STENT INTERVENTION;  Surgeon: Belva Crome, MD;  Location: [place] [facility];  Service: Cardiovascular;  Laterality: N/A;   LEFT HEART CATH AND CORONARY ANGIOGRAPHY N/A 09/01/2020   Procedure: LEFT HEART CATH AND CORONARY ANGIOGRAPHY;  Surgeon: Belva Crome, MD;  Location: [place] [facility];  Service: Cardiovascular;  Laterality: N/A;   ORIF HUMERUS FRACTURE Right 04/10/2019   Procedure: RIGHT OPEN REDUCTION INTERNAL FIXATION (ORIF) DISTAL HUMERUS FRACTURE WITH EXTENSION;  Surgeon: Hiram Gash, MD;  Location: [place];  Service: Orthopedics;  Laterality: Right;   [place]?    Allergies  Allergen Reactions   Atorvastatin     Muscle weakness    Outpatient Encounter Medications as of 01/26/2022  Medication  Sig   acetaminophen (TYLENOL) 650 MG CR tablet Take 1,300 mg by mouth every 8 (eight) hours as needed for pain.   Calcium Carb-Cholecalciferol (CALCIUM 600+D) 600-800 MG-UNIT TABS Take 2 tablets by mouth daily.   cholecalciferol (VITAMIN D) 1000 UNITS tablet Take 2,000 Units by mouth daily.    clobetasol ointment (TEMOVATE) 2.44 % Apply 1 Application topically 2 (two) times daily.   clopidogrel (PLAVIX) 75 MG tablet Take 1 tablet (75 mg total) by mouth daily.   denosumab (PROLIA) 60 MG/ML SOSY injection Inject 60 mg into the skin every 6 (six) months.   ferrous sulfate 325 (65 FE) MG tablet Take 325 mg by mouth daily with breakfast.    l-methylfolate-B6-B12 (METANX) 3-35-2 MG TABS tablet Take 1 tablet by mouth daily. (Patient taking differently: Take 1 tablet by mouth 2 (two) times daily.)   levothyroxine (SYNTHROID) 112 MCG tablet TAKE 1 TABLET BY MOUTH DAILY   loratadine (CLARITIN) 10 MG tablet Take 10 mg by mouth as needed.   nitroGLYCERIN (NITROSTAT) 0.4 MG SL tablet TAKE 1 TABLET UNDER THE TONGUE EVERY 5 MINUTES AS NEEDED FOR CHEST PAIN   rosuvastatin (CRESTOR) 20 MG tablet TAKE 1 TABLET(20 MG) BY MOUTH DAILY   Study - [name] - olpasiran (AMG 890) 142 mg/mL or placebo SQ injection (PI-Hilty) Inject 142 mg into the skin once. For investigational use only - Inject 1 mL (1 prefilled syringe) subcutaneously into appropriate injection site per protocol. (Approved injection site(s): upper arm, upper thigh, abdomen). Please contact [facility] if any questions.   triamcinolone cream (KENALOG) 0.1 % Apply 1 Application topically 2 (two) times daily as needed.   vitamin C (ASCORBIC ACID) 500 MG tablet Take 500 mg by mouth daily.   [DISCONTINUED] escitalopram (LEXAPRO) 5 MG tablet Take 1 tablet (5 mg total) by mouth daily.   [DISCONTINUED] calcium carbonate (OS-CAL) 1250 (500 Ca) MG chewable tablet Chew 1 tablet (1,250 mg total) by mouth daily.   No facility-administered encounter medications on file as of 01/26/2022.    Review of Systems:  Review of Systems  Constitutional:  Negative for activity change and appetite change.  HENT: Negative.    Respiratory:  Negative for cough and shortness of breath.   Cardiovascular:  Negative for leg swelling.  Gastrointestinal:  Negative for constipation.  Genitourinary: Negative.   Musculoskeletal:  Negative for arthralgias, gait problem and myalgias.  Skin: Negative.   Neurological:  Negative for dizziness and weakness.  Psychiatric/Behavioral:  Negative for confusion, dysphoric mood and sleep disturbance.     Health Maintenance  Topic Date Due   Medicare Annual  Wellness (AWV)  09/16/2020   INFLUENZA VACCINE  10/05/2021   COLONOSCOPY (Pts 45-23yr Insurance coverage will need to be confirmed)  02/03/2026 (Originally 07/11/2020)   COVID-19 Vaccine (5 - 2023-24 season) 03/08/2022   Pneumonia Vaccine 80 Years old  Completed   DEXA SCAN  Completed   Zoster Vaccines- Shingrix  Completed   HPV VACCINES  Aged Out    Physical Exam: Vitals:   01/26/22 1350  BP: 112/68  Pulse: 92  Resp: 18  Temp: 97.8 F (36.6 C)  TempSrc: Temporal  SpO2: 94%  Weight: 159 lb 6.4 oz (72.3 kg)  Height: '5\' 3"'$  (1.6 m)   Body mass index is 28.24 kg/m. Physical Exam Vitals reviewed.  Constitutional:      Appearance: Normal appearance.  HENT:     Head: Normocephalic.     Nose: Nose normal.     Mouth/Throat:  Mouth: Mucous membranes are moist.     Pharynx: Oropharynx is clear.  Eyes:     Pupils: Pupils are equal, round, and reactive to light.  Cardiovascular:     Rate and Rhythm: Normal rate and regular rhythm.     Pulses: Normal pulses.     Heart sounds: Normal heart sounds. No murmur heard. Pulmonary:     Effort: Pulmonary effort is normal.     Breath sounds: Normal breath sounds.  Abdominal:     General: Abdomen is flat. Bowel sounds are normal.     Palpations: Abdomen is soft.  Musculoskeletal:        General: No swelling.     Cervical back: Neck supple.     Comments: Rash in her palms  are healing  Skin:    General: Skin is warm.  Neurological:     General: No focal deficit present.     Mental Status: She is alert and oriented to person, place, and time.  Psychiatric:        Mood and Affect: Mood normal.        Thought Content: Thought content normal.     Labs reviewed: Basic Metabolic Panel: Recent Labs    02/08/21 1340 03/25/21 0810 05/06/21 1548 06/10/21 0810 09/23/21 0810 12/30/21 0800  NA  --    < > 146*  --  138 139  K  --    < > 4.8  --  4.2 4.0  CL  --    < > 107*  --  105 105  CO2  --    < > 22  --  22 27  GLUCOSE  --     < > 95  --  79 108*  BUN  --    < > 16  --  18 20  CREATININE  --    < > 0.59  --  0.61 0.64  CALCIUM  --    < > 9.0  --  9.0 8.6  TSH 0.15*  --   --  1.86 1.81  --    < > = values in this interval not displayed.   Liver Function Tests: Recent Labs    03/16/21 0920 03/25/21 0810 09/23/21 0810 12/30/21 0800  AST '14 27 16 16  '$ ALT '20 27 16 14  '$ ALKPHOS 59  --   --   --   BILITOT 0.4 0.5 0.5 0.5  PROT 6.1 6.1 6.1 6.0*  ALBUMIN 4.2  --   --   --    No results for input(s): "LIPASE", "AMYLASE" in the last 8760 hours. No results for input(s): "AMMONIA" in the last 8760 hours. CBC: Recent Labs    03/25/21 0810 05/06/21 1548 09/23/21 0810 12/30/21 0758  WBC 6.1 8.7 7.5 7.9  NEUTROABS 3,264  --  4,650 4,645  HGB 13.1 13.0 13.3 13.4  HCT 39.0 38.5 38.7 38.3  MCV 96.3 97 94.6 95.0  PLT 221 265 245 253   Lipid Panel: Recent Labs    03/16/21 0920 09/23/21 0810  CHOL 149 139  HDL 56 49*  LDLCALC 70 72  TRIG 132 101  CHOLHDL 2.7 2.8   Lab Results  Component Value Date   HGBA1C 5.7 (H) 09/01/2020    Procedures since last visit: No results found.  Assessment/Plan 1. Anxiety and depression Refusing Antidepressant or to see therapist Will continue to monitor  2. Elevated blood sugar A!C ordered  3. Numbness Restarted her metanx BID which helps  Other  issues NSTEMI (non-ST elevated myocardial infarction) (HCC) On Plavix Aspirin and Statin Off Beta Blocker now due ot low BP    Mixed hyperlipidemia On Statin     Acquired hypothyroidism TSH normal    Senile osteoporosis On Prolia DEXA 6/22   Right hip pain Tylenol PRn   Mild cognitive impairment MMSE  is 25/30 in oast Recall is good. Passed her Clock drawing Doing well  Vitamin B 12 deficiency On Supplement Umbilical hernia without obstruction and without gangrene No surgery Iron deficiency Hgb Normal But says Iron helps with Fatigue   Refuses Mammogram No Family history of Breast cancer     Labs/tests ordered:  * No order type specified * Next appt:  04/26/2022

## 2022-01-31 ENCOUNTER — Encounter: Payer: Self-pay | Admitting: Cardiovascular Disease

## 2022-02-03 ENCOUNTER — Ambulatory Visit (INDEPENDENT_AMBULATORY_CARE_PROVIDER_SITE_OTHER): Payer: Medicare Other

## 2022-02-03 ENCOUNTER — Ambulatory Visit (INDEPENDENT_AMBULATORY_CARE_PROVIDER_SITE_OTHER): Payer: Medicare Other | Admitting: Podiatry

## 2022-02-03 ENCOUNTER — Ambulatory Visit: Payer: Medicare Other | Admitting: Podiatry

## 2022-02-03 DIAGNOSIS — M2041 Other hammer toe(s) (acquired), right foot: Secondary | ICD-10-CM

## 2022-02-03 DIAGNOSIS — M21611 Bunion of right foot: Secondary | ICD-10-CM

## 2022-02-03 DIAGNOSIS — L84 Corns and callosities: Secondary | ICD-10-CM | POA: Diagnosis not present

## 2022-02-03 NOTE — Progress Notes (Signed)
Subjective:  Patient ID: Jessica [name], female    DOB: June 18, 1940,  MRN: [id number]  Chief Complaint  Patient presents with   Jessica [name]    Patient is here for hammertoe on right foot that she has had for years.    81 y.o. female presents with concern for bunion and hammertoe on the right foot.  She has had this for many years.  She says that the bunion deformity itself does not bother her too much but she does have pain in between the first and second toe due to them pressing into each other.  She has tried some gel spacers in the past but hoping to see if anything can be done surgically to reduce her deformity and pain.  Past Medical History:  Diagnosis Date   Anemia 12/2015   Bronchitis, chronic (HCC)    in her 31s   Cataract    Chronic kidney disease    in 20s had nephritis   Closed fracture of right distal humerus    right   Coronary artery disease    Hand, foot and mouth disease (HFMD)    Hearing loss 05/03/2016   High cholesterol    Hypothyroidism    Osteopenia    Pericarditis 01/1999   Pneumothorax 01/1999   Thyroid disorder    Vitamin B 12 deficiency     Allergies  Allergen Reactions   Atorvastatin     Muscle weakness    ROS: Negative except as per HPI above  Objective:  General: AAO x3, NAD  Dermatological: Right foot at the medial aspect of the second toe distal interphalangeal joint and the lateral aspect of the hallux interphalangeal joint there is noted to be a hyperkeratotic lesion present with no underlying ulceration.  Pain with palpation of these hyperkeratotic interdigital calluses.  Vascular:  Dorsalis Pedis artery and Posterior Tibial artery pedal pulses are 2/4 bilateral.  Capillary fill time < 3 sec to all digits.   Neruologic: Grossly intact via light touch bilateral. Protective threshold intact to all sites bilateral.   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  Hammertoe of the second digit with lection contracture of the proximal and distal interphalangeal joints.  Minimal to no pain at the medial eminence of the first metatarsal head.  No significant pain with dorsiflexion and plantarflexion range of motion of the first MPJ.  Gait: Unassisted, Nonantalgic.   No images are attached to the encounter.  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  No significant arthritic changes noted about the first metatarsal phalangeal joint first metatarsal head is a large.  Increased hallux valgus angle.  Deformity in the sagittal plane related to the second toe with extension of the MPJ and plantarflexion of the proximal interphalangeal joints. Assessment:   1. Callus of toe   2. Bunion, right foot   3. Hammertoe of right foot      Plan:  Patient was evaluated and treated and all questions answered.  #Interdigital callus between the hallux and second toe on the right foot related to hammertoe and bunion deformity. -Discussed conservative and surgical treatment options for her interdigital callus. -Conservatively could proceed with callus debridement and wearing a gel toe spacer or toe cap which is what I would recommend at this time. All symptomatic hyperkeratosesx  2 at the lateral aspect of the hallux IPJ and the medial aspect of the second toe distal interphalangeal joint were safely debrided with a sterile #15 blade to patient's level of comfort without incident. We discussed preventative and palliative care of these lesions including supportive and accommodative shoegear, padding, prefabricated and custom molded accommodative orthoses, use of a pumice stone and lotions/creams daily. -Surgically we also discussed correction of bunion and hammertoe  deformity.  I do not feel she would be a great candidate for surgical intervention at this time related to the severity of her deformity.  If she were to have correction I would likely consider first metatarsal joint arthrodesis to prevent the hallux from drifting back and abutting the second toe which is causing the bulk of her issue.-Discussed the risk benefits alternatives and expected postoperative course of the surgery with the patient in detail. -We will proceed with conservative care for now and determine if surgery is warranted at when she follows up in 3 months.   Return in about 3 months (around 05/05/2022) for callus/ bunion R foot.          Everitt Amber, DPM [facility]

## 2022-02-04 ENCOUNTER — Other Ambulatory Visit: Payer: Self-pay | Admitting: Podiatry

## 2022-02-04 DIAGNOSIS — M2041 Other hammer toe(s) (acquired), right foot: Secondary | ICD-10-CM

## 2022-02-04 DIAGNOSIS — M21611 Bunion of right foot: Secondary | ICD-10-CM

## 2022-02-04 DIAGNOSIS — L84 Corns and callosities: Secondary | ICD-10-CM

## 2022-02-15 ENCOUNTER — Telehealth: Payer: Self-pay

## 2022-02-15 NOTE — Telephone Encounter (Signed)
Tried to call pt, NA LM on machine with instructions and parking code, asked her to come in at 9:00am instead of 8:30

## 2022-02-16 ENCOUNTER — Encounter: Payer: Medicare Other | Admitting: *Deleted

## 2022-02-16 VITALS — BP 124/72 | HR 59 | Temp 97.2°F | Resp 16

## 2022-02-16 DIAGNOSIS — Z006 Encounter for examination for normal comparison and control in clinical research program: Secondary | ICD-10-CM

## 2022-02-16 MED ORDER — STUDY - OCEAN(A) - OLPASIRAN (AMG 890) 142 MG/ML OR PLACEBO SQ INJECTION (PI-HILTY)
142.0000 mg | PREFILLED_SYRINGE | Freq: Once | SUBCUTANEOUS | Status: AC
  Administered 2022-02-16: 142 mg via SUBCUTANEOUS
  Filled 2022-02-16: qty 1

## 2022-02-16 NOTE — Research (Addendum)
Patient seen in clinic today for week  36 visit of [name] trial. Patient denies any adverse events since last visit. Reviewed medications with patient and no changes noted at this visit. All procedures and  Lab work completed per protocol and patient tolerated well without complaints. IP injection was given at 9:40am in the left arm without any complications noted.  Next visit Week 48 scheduled for 05/25/2022.    Basilio Cairo)  Informed Consent   Subject Name: Jessica [name]  Subject met inclusion and exclusion criteria.  The informed consent form, study requirements and expectations were reviewed with the subject and questions and concerns were addressed prior to the signing of the consent form.  The subject verbalized understanding of the trial requirements.  The subject agreed to participate in the [name] trial and signed the informed consent at 9:00 on 02/16/2022.  The informed consent was obtained prior to performance of any protocol-specific procedures for the subject.  A copy of the signed informed consent was given to the subject and a copy was placed in the subject's medical record.     Subject re-consented to  Version: 5  Jessica [name]    Current Outpatient Medications:    acetaminophen (TYLENOL) 650 MG CR tablet, Take 1,300 mg by mouth every 8 (eight) hours as needed for pain., Disp: , Rfl:    cholecalciferol (VITAMIN D) 1000 UNITS tablet, Take 2,000 Units by mouth daily. , Disp: , Rfl:    clobetasol ointment (TEMOVATE) [id number] %, Apply 1 Application topically 2 (two) times daily., Disp: , Rfl:    clopidogrel (PLAVIX) 75 MG tablet, Take 1 tablet (75 mg total) by mouth daily., Disp: 90 tablet, Rfl: 3   denosumab (PROLIA) 60 MG/ML SOSY injection, Inject 60 mg into the skin every 6 (six) months., Disp: , Rfl:    l-methylfolate-B6-B12 (METANX) 3-35-2 MG TABS tablet, Take 1 tablet by mouth daily. (Patient taking differently: Take 1 tablet by mouth 2 (two) times daily.), Disp: 90  tablet, Rfl: 1   levothyroxine (SYNTHROID) 112 MCG tablet, TAKE 1 TABLET BY MOUTH DAILY, Disp: 90 tablet, Rfl: 3   loratadine (CLARITIN) 10 MG tablet, Take 10 mg by mouth as needed., Disp: , Rfl:    nitroGLYCERIN (NITROSTAT) 0.4 MG SL tablet, TAKE 1 TABLET UNDER THE TONGUE EVERY 5 MINUTES AS NEEDED FOR CHEST PAIN, Disp: 25 tablet, Rfl: 6   rosuvastatin (CRESTOR) 20 MG tablet, TAKE 1 TABLET(20 MG) BY MOUTH DAILY, Disp: 30 tablet, Rfl: 7   Study - [name] - olpasiran (AMG 890) 142 mg/mL or placebo SQ injection (PI-Hilty), Inject 142 mg into the skin once. For investigational use only - Inject 1 mL (1 prefilled syringe) subcutaneously into appropriate injection site per protocol. (Approved injection site(s): upper arm, upper thigh, abdomen). Please contact [facility] if any questions., Disp: , Rfl:    Calcium Carb-Cholecalciferol (CALCIUM 600+D) 600-800 MG-UNIT TABS, Take 2 tablets by mouth daily. (Patient not taking: Reported on 02/16/2022), Disp: , Rfl:    ferrous sulfate 325 (65 FE) MG tablet, Take 325 mg by mouth daily with breakfast. (Patient not taking: Reported on 02/16/2022), Disp: , Rfl:    triamcinolone cream (KENALOG) 0.1 %, Apply 1 Application topically 2 (two) times daily as needed. (Patient not taking: Reported on 02/16/2022), Disp: , Rfl:    vitamin C (ASCORBIC ACID) 500 MG tablet, Take 500 mg by mouth daily. (Patient not taking: Reported on 02/16/2022), Disp: , Rfl:   Current Facility-Administered Medications:    Study -  [name] - olpasiran (AMG 890) 142 mg/mL or placebo SQ injection (PI-Hilty), 142 mg, Subcutaneous, Once, Hilty, Nadean Corwin, MD                    [facility]) IP ADMINISTRATION Subject [phone] IP Box [id number] IP [id number] Time Given:09 ;40 Administration site:Left arm

## 2022-02-18 ENCOUNTER — Other Ambulatory Visit: Payer: Self-pay | Admitting: Cardiovascular Disease

## 2022-02-21 NOTE — Research (Cosign Needed Addendum)
[name] week 36 lab results:        Are there any labs that are clinically significant?  Yes [] OR No[x]  Kenneth C. Hilty, MD, FACC, FACP  [place]  [facility]  Medical Director of the [facility]  [facility] Diplomate of the American Board of Clinical Lipidology Attending Cardiologist  Direct Dial: [phone]  Fax: [phone]  Website:  [email or web address]    

## 2022-02-22 ENCOUNTER — Other Ambulatory Visit: Payer: Self-pay | Admitting: Internal Medicine

## 2022-02-23 ENCOUNTER — Encounter: Payer: Self-pay | Admitting: Internal Medicine

## 2022-03-17 ENCOUNTER — Encounter: Payer: Self-pay | Admitting: Podiatry

## 2022-03-17 ENCOUNTER — Ambulatory Visit (INDEPENDENT_AMBULATORY_CARE_PROVIDER_SITE_OTHER): Payer: Medicare Other | Admitting: Podiatry

## 2022-03-17 ENCOUNTER — Telehealth: Payer: Self-pay | Admitting: Podiatry

## 2022-03-17 VITALS — BP 119/68 | HR 80

## 2022-03-17 DIAGNOSIS — M2041 Other hammer toe(s) (acquired), right foot: Secondary | ICD-10-CM

## 2022-03-17 DIAGNOSIS — M21611 Bunion of right foot: Secondary | ICD-10-CM | POA: Diagnosis not present

## 2022-03-17 DIAGNOSIS — L84 Corns and callosities: Secondary | ICD-10-CM | POA: Diagnosis not present

## 2022-03-17 NOTE — Telephone Encounter (Signed)
Pt called this morning asking if Dr Loel Lofty had any available appts today and upon checking he had one at 945, pt stated she would take it and as I was scheduling it the phone hung up.  I did call pt back to try to confirm appt but had to leave a voicemail.

## 2022-03-17 NOTE — Progress Notes (Signed)
Subjective:  Patient ID: Jessica [name], female    DOB: 1941-02-17,  MRN: [id number]  Chief Complaint  Patient presents with   Follow-up    Patient is here for right foot hammertoe.    82 y.o. female presents with concern for bunion and hammertoe on the right foot.  She is coming back in to discuss possible surgical correction for this deformity in her right foot.  She has daily pain says the gel spacer helps when she is walking however when she takes it off at home it does cause significant pain when she tries to walk.  She has a trip upcoming in May and wants to see if something can be done before then.  Past Medical History:  Diagnosis Date   Anemia 12/2015   Bronchitis, chronic (HCC)    in her 8s   Cataract    Chronic kidney disease    in 20s had nephritis   Closed fracture of right distal humerus    right   Coronary artery disease    Hand, foot and mouth disease (HFMD)    Hearing loss 05/03/2016   High cholesterol    Hypothyroidism    Osteopenia    Pericarditis 01/1999   Pneumothorax 01/1999   Thyroid disorder    Vitamin B 12 deficiency     Allergies  Allergen Reactions   Atorvastatin     Muscle weakness    ROS: Negative except as per HPI above  Objective:  General: AAO x3, NAD  Dermatological: Right foot at the medial aspect of the second toe distal interphalangeal joint and the lateral aspect of the hallux interphalangeal joint there is noted to be a hyperkeratotic lesion present with no underlying ulceration.  Pain with palpation of these hyperkeratotic interdigital calluses.  Vascular:  Dorsalis Pedis artery and Posterior Tibial artery pedal pulses are 2/4 bilateral.  Capillary fill time < 3 sec to all digits.   Neruologic: Grossly intact via light touch bilateral. Protective threshold intact to all sites bilateral.   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  Hammertoe of the second digit with lection contracture of the proximal and distal interphalangeal joints.  Minimal to no pain at the medial eminence of the first metatarsal head.  No significant pain with dorsiflexion and plantarflexion range of motion of the first MPJ.  Gait: Unassisted, Nonantalgic.   No images are attached to the encounter.  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  No significant arthritic changes noted about the first metatarsal phalangeal joint first metatarsal head is a large.  Increased hallux valgus angle.  Deformity in the sagittal plane related to the second toe with extension of the MPJ and plantarflexion of the proximal interphalangeal joints. Assessment:   1. Bunion, right foot   2. Hammertoe of right foot   3. Callus of toe       Plan:  Patient was evaluated and treated and all questions answered.  #Interdigital callus between the hallux and second toe on the right foot related to hammertoe and bunion deformity. -Discussed conservative and surgical treatment options for her interdigital callus. -Conservatively could proceed with callus debridement and wearing a gel toe spacer or toe cap -patient has been doing this and it does help however not fully alleviating her symptoms -Surgically we  again discussed correction of bunion and hammertoe deformity.   -Discussed the plan for surgery would be for first metatarsophalangeal joint fusion to straighten the great toe and prevent it from rubbing into the second toe which is her primary issue.  Additionally she would like the second hammertoe corrected at the same time. -Discussed risk benefits alternatives and possible complications associate with the surgeries.  Discussed postoperative course including need for  nonweightbearing for 4 to 6 weeks postoperatively.  Patient understands these risks and wishes to proceed forward surgical consent was obtained.  Will begin surgical planning   Return for After or.          Everitt Amber, DPM [facility]

## 2022-03-18 ENCOUNTER — Telehealth: Payer: Self-pay

## 2022-03-18 NOTE — Telephone Encounter (Signed)
Jessica [name] called and cancelled her surgery with Dr. Loel Lofty on 04/20/2022. She stated she wants to wait on having surgery. Notified Dr. Loel Lofty and Caren Griffins with [facility]

## 2022-03-24 ENCOUNTER — Emergency Department (HOSPITAL_BASED_OUTPATIENT_CLINIC_OR_DEPARTMENT_OTHER)
Admission: EM | Admit: 2022-03-24 | Discharge: 2022-03-24 | Disposition: A | Discharge status: Home / Self Care | Payer: Medicare Other | Attending: Emergency Medicine | Admitting: Emergency Medicine

## 2022-03-24 ENCOUNTER — Encounter (HOSPITAL_BASED_OUTPATIENT_CLINIC_OR_DEPARTMENT_OTHER): Payer: Self-pay | Admitting: Emergency Medicine

## 2022-03-24 ENCOUNTER — Other Ambulatory Visit: Payer: Self-pay

## 2022-03-24 ENCOUNTER — Ambulatory Visit: Payer: Medicare Other | Admitting: Podiatry

## 2022-03-24 ENCOUNTER — Encounter: Payer: Medicare Other | Admitting: Nurse Practitioner

## 2022-03-24 ENCOUNTER — Emergency Department (HOSPITAL_BASED_OUTPATIENT_CLINIC_OR_DEPARTMENT_OTHER): Payer: Medicare Other | Admitting: Radiology

## 2022-03-24 DIAGNOSIS — N189 Chronic kidney disease, unspecified: Secondary | ICD-10-CM | POA: Insufficient documentation

## 2022-03-24 DIAGNOSIS — I2510 Atherosclerotic heart disease of native coronary artery without angina pectoris: Secondary | ICD-10-CM | POA: Insufficient documentation

## 2022-03-24 DIAGNOSIS — S61412A Laceration without foreign body of left hand, initial encounter: Secondary | ICD-10-CM | POA: Diagnosis not present

## 2022-03-24 DIAGNOSIS — Y92232 Corridor of hospital as the place of occurrence of the external cause: Secondary | ICD-10-CM | POA: Insufficient documentation

## 2022-03-24 DIAGNOSIS — S62355A Nondisplaced fracture of shaft of fourth metacarpal bone, left hand, initial encounter for closed fracture: Secondary | ICD-10-CM

## 2022-03-24 DIAGNOSIS — W01198A Fall on same level from slipping, tripping and stumbling with subsequent striking against other object, initial encounter: Secondary | ICD-10-CM | POA: Insufficient documentation

## 2022-03-24 DIAGNOSIS — W19XXXA Unspecified fall, initial encounter: Secondary | ICD-10-CM

## 2022-03-24 DIAGNOSIS — S6992XA Unspecified injury of left wrist, hand and finger(s), initial encounter: Secondary | ICD-10-CM | POA: Diagnosis present

## 2022-03-24 DIAGNOSIS — M7989 Other specified soft tissue disorders: Secondary | ICD-10-CM | POA: Diagnosis not present

## 2022-03-24 DIAGNOSIS — M79642 Pain in left hand: Secondary | ICD-10-CM | POA: Diagnosis not present

## 2022-03-24 DIAGNOSIS — Z23 Encounter for immunization: Secondary | ICD-10-CM | POA: Diagnosis not present

## 2022-03-24 MED ORDER — TETANUS-DIPHTH-ACELL PERTUSSIS 5-2.5-18.5 LF-MCG/0.5 IM SUSY
0.5000 mL | PREFILLED_SYRINGE | Freq: Once | INTRAMUSCULAR | Status: AC
  Administered 2022-03-24: 0.5 mL via INTRAMUSCULAR
  Filled 2022-03-24: qty 0.5

## 2022-03-24 NOTE — Progress Notes (Signed)
This encounter was created in error - please disregard.

## 2022-03-24 NOTE — ED Triage Notes (Signed)
Fell yesterday in a hallway, fell on left side to floor, hitting her left hip and left hand, did not hit head, no LOC.she has a laceration to left hand, the nurse at her facility was worried about how it looked. No fever n/v. Pt is on plavix.

## 2022-03-24 NOTE — Discharge Instructions (Addendum)
X-ray shows evidence of a fourth metacarpal fracture.  Will treat with short arm splint and have her follow-up with hand surgery.  Keep the splint in place to keep it dry.  Call [facility], Dr. Milly Jakob, for follow-up of the fracture.

## 2022-03-24 NOTE — ED Provider Notes (Addendum)
[facility] EMERGENCY DEPT Provider Note   CSN: [id number] Arrival date & time: 03/24/22  1011     History  No chief complaint on file.   Jessica [name] is a 82 y.o. female.  Patient with a fall yesterday in the hallway.  Fell on left [name] to the floor.  Patient sent in by facility nurse due to bruising to the left hand has a skin tear on the dorsum of the left hand that measures about 1.5 cm.  Patient did not hit her head no loss of consciousness.  Patient is not concerned about her hip is sore but she is able to ambulate.  She does not want an x-ray of that area.  Her main concern is the left hand.  Patient is right-hand dominant.  Patient's tetanus not up-to-date.  Patient is on Plavix.  Past medical history significant for high cholesterol thyroid disorder pericarditis chronic kidney disease coronary artery disease.  Patient is never used tobacco products.       Home Medications Prior to Admission medications   Medication Sig Start Date End Date Taking? Authorizing Provider  acetaminophen (TYLENOL) 650 MG CR tablet Take 1,300 mg by mouth every 8 (eight) hours as needed for pain.    [provider]  Calcium Carb-Cholecalciferol (CALCIUM 600+D) 600-800 MG-UNIT TABS Take 2 tablets by mouth daily.    [provider]  cholecalciferol (VITAMIN D) 1000 UNITS tablet Take 2,000 Units by mouth daily.     [provider]  clobetasol ointment (TEMOVATE) 2.54 % Apply 1 Application topically 2 (two) times daily. 01/24/22   [provider]  clopidogrel (PLAVIX) 75 MG tablet Take 1 tablet (75 mg total) by mouth daily. 05/06/21   Sherren Mocha, MD  denosumab (PROLIA) 60 MG/ML SOSY injection Inject 60 mg into the skin every 6 (six) months.    [provider]  ferrous sulfate 325 (65 FE) MG tablet Take 325 mg by mouth daily with breakfast.    [provider]  l-methylfolate-B6-B12 (METANX) 3-35-2 MG TABS tablet Take 1 tablet by  mouth daily. Patient taking differently: Take 1 tablet by mouth 2 (two) times daily. 12/07/21   Fargo, Amy E, NP  levothyroxine (SYNTHROID) 112 MCG tablet TAKE 1 TABLET BY MOUTH DAILY 02/23/22   Virgie Dad, MD  loratadine (CLARITIN) 10 MG tablet Take 10 mg by mouth as needed.    [provider]  nitroGLYCERIN (NITROSTAT) 0.4 MG SL tablet TAKE 1 TABLET UNDER THE TONGUE EVERY 5 MINUTES AS NEEDED FOR CHEST PAIN 11/25/21   Sherren Mocha, MD  rosuvastatin (CRESTOR) 20 MG tablet TAKE 1 TABLET(20 MG) BY MOUTH DAILY 10/18/21   Sherren Mocha, MD  Study - [name] - olpasiran (AMG 890) 142 mg/mL or placebo SQ injection (PI-Hilty) Inject 142 mg into the skin once. For investigational use only - Inject 1 mL (1 prefilled syringe) subcutaneously into appropriate injection site per protocol. (Approved injection site(s): upper arm, upper thigh, abdomen). Please contact [facility] if any questions.    Pixie Casino, MD  triamcinolone cream (KENALOG) 0.1 % Apply 1 Application topically 2 (two) times daily as needed. 01/22/22   [provider]  vitamin C (ASCORBIC ACID) 500 MG tablet Take 500 mg by mouth daily.    [provider]  calcium carbonate (OS-CAL) 1250 (500 Ca) MG chewable tablet Chew 1 tablet (1,250 mg total) by mouth daily. 09/11/17 09/01/20  Blanchie Serve, MD      Allergies  Atorvastatin    Review of Systems   Review of Systems  Constitutional:  Negative for chills and fever.  HENT:  Negative for ear pain and sore throat.   Eyes:  Negative for pain and visual disturbance.  Respiratory:  Negative for cough and shortness of breath.   Cardiovascular:  Negative for chest pain and palpitations.  Gastrointestinal:  Negative for abdominal pain and vomiting.  Genitourinary:  Negative for dysuria and hematuria.  Musculoskeletal:  Negative for arthralgias and back pain.  Skin:  Positive for wound. Negative for color change and rash.  Neurological:   Negative for seizures and syncope.  All other systems reviewed and are negative.   Physical Exam Updated Vital Signs BP 127/74   Pulse 61   Temp 98.1 F (36.7 C) (Oral)   Resp 20   SpO2 99%  Physical Exam Vitals and nursing note reviewed.  Constitutional:      General: She is not in acute distress.    Appearance: Normal appearance. She is well-developed.  HENT:     Head: Normocephalic and atraumatic.  Eyes:     Extraocular Movements: Extraocular movements intact.     Conjunctiva/sclera: Conjunctivae normal.     Pupils: Pupils are equal, round, and reactive to light.  Cardiovascular:     Rate and Rhythm: Normal rate and regular rhythm.     Heart sounds: No murmur heard. Pulmonary:     Effort: Pulmonary effort is normal. No respiratory distress.     Breath sounds: Normal breath sounds.  Abdominal:     Palpations: Abdomen is soft.     Tenderness: There is no abdominal tenderness.  Musculoskeletal:        General: Swelling, tenderness and signs of injury present.     Cervical back: Neck supple.     Comments: Left hand with a 1.5 cm skin tear to the dorsum.  No active bleeding.  Bruising and swelling to the area.  Also bruising and swelling on the palmar [name] more on the hyperthenar area.  Good range of motion no obvious deformity radial pulses 2+ cap refill intact sensation intact.  In addition patient able to move both legs without any pain.  Skin:    General: Skin is warm and dry.     Capillary Refill: Capillary refill takes less than 2 seconds.  Neurological:     General: No focal deficit present.     Mental Status: She is alert and oriented to person, place, and time.     Cranial Nerves: No cranial nerve deficit.     Sensory: No sensory deficit.     Motor: No weakness.  Psychiatric:        Mood and Affect: Mood normal.     ED Results / Procedures / Treatments   Labs (all labs ordered are listed, but only abnormal results are displayed) Labs Reviewed - No data to  display  EKG None  Radiology DG Hand Complete Left  Result Date: 03/24/2022 CLINICAL DATA:  Pain after fall yesterday EXAM: LEFT HAND - COMPLETE 3 VIEW COMPARISON:  None Available. FINDINGS: Osteopenia. There is a minimally displaced comminuted fracture of the midshaft of the fourth metacarpal. No additional fracture or dislocation. Minimal degenerative changes of the interphalangeal joints distally particularly of second digit with some joint space loss and tiny osteophytes. Artifact overlaps the wrist proximally. IMPRESSION: Osteopenia with comminuted minimally displaced midshaft fourth metacarpal fracture scattered degenerative change Electronically Signed   By: Jessica [name] M.D.   On: 03/24/2022  11:55    Procedures Procedures    Medications Ordered in ED Medications  Tdap (BOOSTRIX) injection 0.5 mL (0.5 mLs Intramuscular Given 03/24/22 1112)    ED Course/ Medical Decision Making/ A&P                             Medical Decision Making Amount and/or Complexity of Data Reviewed Radiology: ordered.  Risk Prescription drug management.   With skin tear to the dorsum of the left hand.  Also lots of bruising.  Will get x-ray to make sure that there is no fracture.  Patient will need update of her tetanus.  Would recommend x-ray of the left hip but patient does not want that.  Will have nursing do a new dressing to the skin tear to the left hand with antibiotic ointment and nonadherent dressing.  X-ray shows evidence of a comminuted minimally displaced midshaft fourth metacarpal fracture.  Very minimal displacement.  Lined up quite nicely.  Will treat with a short arm splint.  Have her follow-up with hand surgery Dr. Milly [name] for [name] is on-call today.  Patient does not have an orthopedic group that she uses.  This will cover up the wound patient stated that we will just keep her nonadherent dressing in place over that skin tear.  Final Clinical Impression(s) / ED  Diagnoses Final diagnoses:  Fall, initial encounter  Injury of left hand, initial encounter  Skin tear of left hand without complication, initial encounter  Closed nondisplaced fracture of shaft of fourth metacarpal bone of left hand, initial encounter    Rx / DC Orders ED Discharge Orders     None         Fredia Sorrow, MD 03/24/22 1111    Fredia Sorrow, MD 03/24/22 1237

## 2022-03-25 DIAGNOSIS — S7002XA Contusion of left hip, initial encounter: Secondary | ICD-10-CM | POA: Diagnosis not present

## 2022-03-25 DIAGNOSIS — W19XXXA Unspecified fall, initial encounter: Secondary | ICD-10-CM | POA: Diagnosis not present

## 2022-03-29 DIAGNOSIS — S62355A Nondisplaced fracture of shaft of fourth metacarpal bone, left hand, initial encounter for closed fracture: Secondary | ICD-10-CM | POA: Diagnosis not present

## 2022-03-30 DIAGNOSIS — M21611 Bunion of right foot: Secondary | ICD-10-CM | POA: Diagnosis not present

## 2022-03-30 DIAGNOSIS — M79674 Pain in right toe(s): Secondary | ICD-10-CM | POA: Diagnosis not present

## 2022-03-31 ENCOUNTER — Telehealth: Payer: Self-pay | Admitting: Cardiovascular Disease

## 2022-03-31 NOTE — Telephone Encounter (Signed)
Pt is calling back to speak to someone in regards to leg swelling. She states that her leg does seem to be a little more swollen than it was this morning. She would also like to note that she did see similar swelling with no cause in the past following a heart attack. Pt is still reporting no SOB and no other symptoms. Requesting return call.

## 2022-03-31 NOTE — Telephone Encounter (Signed)
Pt c/o swelling: STAT is pt has developed SOB within 24 hours  How much weight have you gained and in what time span? 8 lbs since Thanksgiving   If swelling, where is the swelling located? In in left lower leg and ankle   Are you currently taking a fluid pill? Yes  Are you currently SOB? No  Do you have a log of your daily weights (if so, list)? No  Have you gained 3 pounds in a day or 5 pounds in a week? No  Have you traveled recently? No.

## 2022-04-01 ENCOUNTER — Encounter: Payer: Self-pay | Admitting: Cardiovascular Disease

## 2022-04-01 DIAGNOSIS — Z006 Encounter for examination for normal comparison and control in clinical research program: Secondary | ICD-10-CM

## 2022-04-01 NOTE — Telephone Encounter (Signed)
Returned call to patient who states that her L ankle, foot, and mid-calf are swollen. States she first noticed a few nights ago. Does admit to falling onto hardwood floor a week ago (broke her hand), landed on L leg when falling. (Did not break hip, but very bruised and sore) Denies pain to lower leg at all. No discoloration. No localized warm/hot spots. Is able to wiggle toes on the left, has full range of motion to foot and ankle. States L is noticably larger than the right, states "my ankle has disappeared on the left." States that she does weigh herself at home and has noticed "about a pound or two difference" but attributes this to what she has been eating. Not checking BP regularly, but states "its been good." Patient states she has spent most of her time at home sitting, but denies elevating extremity. No SOB or cough. Advised patient that since she has been on Plavix, the chances of clot/DVT are reduced quite a bit. She denies all signs/symptoms of DVT. Recommended that she soak her L foot in an epsom salt bath and keep leg propped on 1-2 pillows if sitting down at all. She verbalized understanding and will reach out to PCP if swelling worsens or anything else arises.

## 2022-04-01 NOTE — Telephone Encounter (Signed)
Pt calling back for an update

## 2022-04-01 NOTE — Research (Signed)
Pt called this AM complaining of LL edema and fatigue. Denies SOB. Encouraged to call cardiology office for potential med changes. Pt verbalized understanding.

## 2022-04-01 NOTE — Telephone Encounter (Signed)
Pt is calling back again to get update. She states she would like to be advised today in regards to swelling she is experiencing.

## 2022-04-05 NOTE — Telephone Encounter (Signed)
See telephone encounter from 03/31/22.

## 2022-04-07 ENCOUNTER — Encounter: Payer: Self-pay | Admitting: Internal Medicine

## 2022-04-07 ENCOUNTER — Encounter: Payer: Medicare Other | Admitting: Nurse Practitioner

## 2022-04-07 DIAGNOSIS — S6292XS Unspecified fracture of left wrist and hand, sequela: Secondary | ICD-10-CM | POA: Insufficient documentation

## 2022-04-07 DIAGNOSIS — F418 Other specified anxiety disorders: Secondary | ICD-10-CM | POA: Insufficient documentation

## 2022-04-07 DIAGNOSIS — M7989 Other specified soft tissue disorders: Secondary | ICD-10-CM | POA: Insufficient documentation

## 2022-04-07 NOTE — Assessment & Plan Note (Signed)
On supplement 

## 2022-04-07 NOTE — Assessment & Plan Note (Signed)
TSH 1.81 09/23/21, not taking psychotropic meds.

## 2022-04-07 NOTE — Assessment & Plan Note (Signed)
NSTEMI, on Plavix, ASA, statin, off Beta blocker 2/2 low Bp

## 2022-04-07 NOTE — Progress Notes (Deleted)
This encounter was created in error - please disregard.  This encounter was created in error - please disregard.

## 2022-04-07 NOTE — Assessment & Plan Note (Signed)
takes Iron for "fatigue", Hgb 13.4 12/30/21

## 2022-04-07 NOTE — Assessment & Plan Note (Signed)
takes Rosuvastatin, LDL 72 09/23/21

## 2022-04-07 NOTE — Assessment & Plan Note (Signed)
ED eval 03/24/22, fall, fx left hand-shaft of 4th metacarpal bone, f/u Dr. [facility]

## 2022-04-07 NOTE — Assessment & Plan Note (Signed)
DEXA 6/22, t score -2.6, on Ca, Vit D, Prolia

## 2022-04-07 NOTE — Assessment & Plan Note (Signed)
takes Levothyroxine, TSH 1.81 09/23/21

## 2022-04-07 NOTE — Assessment & Plan Note (Signed)
independent living.

## 2022-04-07 NOTE — Assessment & Plan Note (Signed)
left foot, ankle, calf, cardiology advised 04/01/22 to elevated leg, soak in an epson salt bath

## 2022-04-08 ENCOUNTER — Telehealth: Payer: Medicare Other | Admitting: Student

## 2022-04-08 ENCOUNTER — Other Ambulatory Visit: Payer: Self-pay

## 2022-04-08 ENCOUNTER — Encounter: Payer: Medicare Other | Admitting: Family

## 2022-04-08 DIAGNOSIS — R7303 Prediabetes: Secondary | ICD-10-CM | POA: Diagnosis not present

## 2022-04-08 DIAGNOSIS — M25472 Effusion, left ankle: Secondary | ICD-10-CM

## 2022-04-08 DIAGNOSIS — M25471 Effusion, right ankle: Secondary | ICD-10-CM

## 2022-04-08 DIAGNOSIS — I25118 Atherosclerotic heart disease of native coronary artery with other forms of angina pectoris: Secondary | ICD-10-CM | POA: Insufficient documentation

## 2022-04-08 DIAGNOSIS — I2510 Atherosclerotic heart disease of native coronary artery without angina pectoris: Secondary | ICD-10-CM | POA: Insufficient documentation

## 2022-04-08 NOTE — Progress Notes (Unsigned)
[facility] clinic  Provider: Monroe Qin  Code Status: Full Goals of Care:     03/24/2022   10:26 AM  Advanced Directives  Does Patient Have a Medical Advance Directive? No  Would patient like information on creating a medical advance directive? No - Patient declined     Chief Complaint  Patient presents with   Acute Visit    Patient complains of swelling in left leg. Patient states she also has tingling in leg.Patient has had swelling for about 3 days. No redness, no warmth to touch or tenderness.     HPI: Patient is a 82 y.o. female seen today for an acute visit for leg swelling. 3 days ago it began to swell. She prevoiusly had swelling in her leg before a heart attack. She also has some tingling up the leg. She was recently at the orthopedist who stated she has peripheral neuropathy and gabapentin has helped. She has noticed some of the pain has come from when she has eaten sweets. She spoke to cardiology nurse and said a   Eating well, drinking well. Urinating fine.   She fell 1 week ago. She broke her hand and hit her hip hard but no fracture at that time. She has had neuropathy for a while. She takes metanx to help with that. Primarily   She has not had gout, but here family does.   Past Medical History:  Diagnosis Date   Anemia 12/2015   Bronchitis, chronic (HCC)    in her 37s   Cataract    Chronic kidney disease    in 59s had nephritis   Closed fracture of right distal humerus    right   Coronary artery disease    Hand, foot and mouth disease (HFMD)    Hearing loss 05/03/2016   High cholesterol    Hypothyroidism    Osteopenia    Pericarditis 01/1999   Pneumothorax 01/1999   Thyroid disorder    Vitamin B 12 deficiency     Past Surgical History:  Procedure Laterality Date   birthmark      removed,left leg   CARDIAC CATHETERIZATION     CATARACT EXTRACTION  2017   CORONARY STENT INTERVENTION N/A 09/01/2020   Procedure: CORONARY STENT INTERVENTION;  Surgeon: Belva Crome, MD;  Location: [place] [facility];  Service: Cardiovascular;  Laterality: N/A;   LEFT HEART CATH AND CORONARY ANGIOGRAPHY N/A 09/01/2020   Procedure: LEFT HEART CATH AND CORONARY ANGIOGRAPHY;  Surgeon: Belva Crome, MD;  Location: [place] [facility];  Service: Cardiovascular;  Laterality: N/A;   ORIF HUMERUS FRACTURE Right 04/10/2019   Procedure: RIGHT OPEN REDUCTION INTERNAL FIXATION (ORIF) DISTAL HUMERUS FRACTURE WITH EXTENSION;  Surgeon: Hiram Gash, MD;  Location: [place];  Service: Orthopedics;  Laterality: Right;   [place]?    Allergies  Allergen Reactions   Atorvastatin     Muscle weakness    Outpatient Encounter Medications as of 04/08/2022  Medication Sig   acetaminophen (TYLENOL) 650 MG CR tablet Take 1,300 mg by mouth every 8 (eight) hours as needed for pain.   Calcium Carb-Cholecalciferol (CALCIUM 600+D) 600-800 MG-UNIT TABS Take 2 tablets by mouth daily.   cholecalciferol (VITAMIN D) 1000 UNITS tablet Take 2,000 Units by mouth daily.    clobetasol ointment (TEMOVATE) 5.05 % Apply 1 Application topically 2 (two) times daily.   clopidogrel (PLAVIX) 75 MG tablet Take 1 tablet (75 mg total) by mouth daily.  denosumab (PROLIA) 60 MG/ML SOSY injection Inject 60 mg into the skin every 6 (six) months.   ferrous sulfate 325 (65 FE) MG tablet Take 325 mg by mouth daily with breakfast.   gabapentin (NEURONTIN) 100 MG capsule 100 mg daily.   l-methylfolate-B6-B12 (METANX) 3-35-2 MG TABS tablet Take 1 tablet by mouth daily. (Patient taking differently: Take 1 tablet by mouth 2 (two) times daily.)   levothyroxine (SYNTHROID) 112 MCG tablet TAKE 1 TABLET BY MOUTH DAILY   loratadine (CLARITIN) 10 MG tablet Take 10 mg by mouth as needed.   nitroGLYCERIN (NITROSTAT) 0.4 MG SL tablet TAKE 1 TABLET UNDER THE TONGUE EVERY 5 MINUTES AS NEEDED FOR CHEST PAIN   rosuvastatin (CRESTOR) 20 MG tablet TAKE 1 TABLET(20 MG) BY MOUTH DAILY   Study  - [name] - olpasiran (AMG 890) 142 mg/mL or placebo SQ injection (PI-Hilty) Inject 142 mg into the skin once. For investigational use only - Inject 1 mL (1 prefilled syringe) subcutaneously into appropriate injection site per protocol. (Approved injection site(s): upper arm, upper thigh, abdomen). Please contact [facility] if any questions.   triamcinolone cream (KENALOG) 0.1 % Apply 1 Application topically 2 (two) times daily as needed.   vitamin C (ASCORBIC ACID) 500 MG tablet Take 500 mg by mouth daily.   [DISCONTINUED] calcium carbonate (OS-CAL) 1250 (500 Ca) MG chewable tablet Chew 1 tablet (1,250 mg total) by mouth daily.   No facility-administered encounter medications on file as of 04/08/2022.    Review of Systems:  Review of Systems  Health Maintenance  Topic Date Due   Medicare Annual Wellness (AWV)  09/16/2020   INFLUENZA VACCINE  10/05/2021   COVID-19 Vaccine (5 - 2023-24 season) 03/08/2022   COLONOSCOPY (Pts 45-57yr Insurance coverage will need to be confirmed)  02/03/2026 (Originally 07/11/2020)   DTaP/Tdap/Td (4 - Td or Tdap) 03/24/2032   Pneumonia Vaccine 82 Years old  Completed   DEXA SCAN  Completed   Zoster Vaccines- Shingrix  Completed   HPV VACCINES  Aged Out    Physical Exam: There were no vitals filed for this visit. There is no height or weight on file to calculate BMI. Physical Exam Constitutional:      Comments: Virtual visit. Appears well in no acute distress  Skin:    Comments: Slight erythema of L ankle. Visible indentation of sock on leg     Labs reviewed: Basic Metabolic Panel: Recent Labs    05/06/21 1548 06/10/21 0810 09/23/21 0810 12/30/21 0800  NA 146*  --  138 139  K 4.8  --  4.2 4.0  CL 107*  --  105 105  CO2 22  --  22 27  GLUCOSE 95  --  79 108*  BUN 16  --  18 20  CREATININE 0.59  --  0.61 0.64  CALCIUM 9.0  --  9.0 8.6  TSH  --  1.86 1.81  --    Liver Function Tests: Recent Labs    09/23/21 0810  12/30/21 0800  AST 16 16  ALT 16 14  BILITOT 0.5 0.5  PROT 6.1 6.0*   No results for input(s): "LIPASE", "AMYLASE" in the last 8760 hours. No results for input(s): "AMMONIA" in the last 8760 hours. CBC: Recent Labs    05/06/21 1548 09/23/21 0810 12/30/21 0758  WBC 8.7 7.5 7.9  NEUTROABS  --  4,650 4,645  HGB 13.0 13.3 13.4  HCT 38.5 38.7 38.3  MCV 97 94.6 95.0  PLT 265 245 253  Lipid Panel: Recent Labs    09/23/21 0810  CHOL 139  HDL 49*  LDLCALC 72  TRIG 101  CHOLHDL 2.8   Lab Results  Component Value Date   HGBA1C 5.7 (H) 09/01/2020    Procedures since last visit: DG Hand Complete Left  Result Date: 03/24/2022 CLINICAL DATA:  Pain after fall yesterday EXAM: LEFT HAND - COMPLETE 3 VIEW COMPARISON:  None Available. FINDINGS: Osteopenia. There is a minimally displaced comminuted fracture of the midshaft of the fourth metacarpal. No additional fracture or dislocation. Minimal degenerative changes of the interphalangeal joints distally particularly of second digit with some joint space loss and tiny osteophytes. Artifact overlaps the wrist proximally. IMPRESSION: Osteopenia with comminuted minimally displaced midshaft fourth metacarpal fracture scattered degenerative change Electronically Signed   By: Jill Side M.D.   On: 03/24/2022 11:55    Assessment/Plan Swollen ankles - Plan: D-dimer, Quantitative, Uric Acid, CANCELED: D-dimer, Quantitative, CANCELED: Uric Acid  Prediabetes - Plan: Hemoglobin A1c, CANCELED: Hemoglobin A1c Left ankle swelling. Unable to perform full exam due to virtual visits. Based on acuity and symptoms ddx includes Gout vs DVT vs venous insufficiency. Discussed concerns for delays due to labs and no imaging, patient is okay with this. Plan to collect labs on Monday as outlined above. ED precautions discussed. F/u with PCP. Labs scheduled for facility per MA  Labs/tests ordered:  * No order type specified * Next appt:  04/26/2022

## 2022-04-08 NOTE — Progress Notes (Unsigned)
This service is provided via telemedicine  No vital signs collected/recorded due to the encounter was a telemedicine visit.   Location of patient (ex: home, work):  Home  Patient consents to a telephone visit:  Yes, see encounter dated 04/08/2022  Location of the provider (ex: office, home):  [place]  Name of any referring provider:  Veleta Miners, MD  Names of all persons participating in the telemedicine service and their role in the encounter:  Lyndal Rainbow, CMA, and patient.    Time spent on call:  9 minutes with medical assistant

## 2022-04-08 NOTE — Progress Notes (Signed)
This encounter was created in error - please disregard.

## 2022-04-09 ENCOUNTER — Encounter: Payer: Self-pay | Admitting: Student

## 2022-04-11 ENCOUNTER — Other Ambulatory Visit: Payer: Medicare Other

## 2022-04-11 DIAGNOSIS — R7303 Prediabetes: Secondary | ICD-10-CM | POA: Diagnosis not present

## 2022-04-11 DIAGNOSIS — M810 Age-related osteoporosis without current pathological fracture: Secondary | ICD-10-CM

## 2022-04-11 DIAGNOSIS — E782 Mixed hyperlipidemia: Secondary | ICD-10-CM

## 2022-04-11 DIAGNOSIS — E611 Iron deficiency: Secondary | ICD-10-CM

## 2022-04-11 DIAGNOSIS — M25471 Effusion, right ankle: Secondary | ICD-10-CM | POA: Diagnosis not present

## 2022-04-11 DIAGNOSIS — E039 Hypothyroidism, unspecified: Secondary | ICD-10-CM

## 2022-04-11 DIAGNOSIS — M25472 Effusion, left ankle: Secondary | ICD-10-CM | POA: Diagnosis not present

## 2022-04-12 LAB — HEMOGLOBIN A1C
Hgb A1c MFr Bld: 5.9 % of total Hgb — ABNORMAL HIGH (ref <5.7)
Mean Plasma Glucose: 123 mg/dL
eAG (mmol/L): 6.8 mmol/L

## 2022-04-12 LAB — URIC ACID: Uric Acid, Serum: 3.3 mg/dL (ref 2.5–7.0)

## 2022-04-12 LAB — D-DIMER, QUANTITATIVE: D-Dimer, Quant: 3.24 mcg/mL FEU — ABNORMAL HIGH (ref <0.50)

## 2022-04-12 NOTE — Telephone Encounter (Signed)
Patient called and scheduled an appointment with Dr. Lyndel Safe at [facility] 04/13/2022

## 2022-04-13 ENCOUNTER — Telehealth (HOSPITAL_BASED_OUTPATIENT_CLINIC_OR_DEPARTMENT_OTHER): Payer: Self-pay | Admitting: *Deleted

## 2022-04-13 ENCOUNTER — Encounter: Payer: Self-pay | Admitting: Internal Medicine

## 2022-04-13 ENCOUNTER — Telehealth: Payer: Self-pay | Admitting: *Deleted

## 2022-04-13 ENCOUNTER — Other Ambulatory Visit: Payer: Self-pay | Admitting: Cardiovascular Disease

## 2022-04-13 ENCOUNTER — Non-Acute Institutional Stay: Payer: Medicare Other | Admitting: Internal Medicine

## 2022-04-13 VITALS — BP 110/74 | HR 61 | Temp 97.6°F | Resp 16 | Ht 63.0 in | Wt 157.0 lb

## 2022-04-13 DIAGNOSIS — R7303 Prediabetes: Secondary | ICD-10-CM | POA: Diagnosis not present

## 2022-04-13 DIAGNOSIS — M7989 Other specified soft tissue disorders: Secondary | ICD-10-CM

## 2022-04-13 DIAGNOSIS — R200 Anesthesia of skin: Secondary | ICD-10-CM

## 2022-04-13 DIAGNOSIS — E611 Iron deficiency: Secondary | ICD-10-CM

## 2022-04-13 MED ORDER — GABAPENTIN 100 MG PO CAPS: 100.0000 mg | ORAL_CAPSULE | Freq: Every evening | ORAL | 3 refills | Status: DC | PRN

## 2022-04-13 NOTE — Telephone Encounter (Signed)
Left message for patient to call and discuss scheduling the lower venous doppler ordered by Dr. Lyndel Safe

## 2022-04-13 NOTE — Progress Notes (Signed)
Location: [facility] (12)  Provider:   Code Status:  Goals of Care:     04/13/2022    8:10 AM  Advanced Directives  Does Patient Have a Medical Advance Directive? Yes  Type of Advance Directive Living will;Healthcare Power of Attorney  Does patient want to make changes to medical advance directive? No - Patient declined  Copy of [place] in Chart? No - copy requested     Chief Complaint  Patient presents with   Medical Management of Chronic Issues    Patient states she is here to follow up on labs    HPI: Patient is a 82 y.o. female seen today for an acute visit for Swelling in her Left leg more then Right Also Elevated D Dimer Patient also want to talk about her neuropathy and PO iron  Lower Leg swelling Left Leg more then right D Dimer positive No pain  Neuropathy Has seen Neurology before many years ago Was taking B 12 supplement which helped but recent worsening  Was prescribed Gabapentin which helped She also cut back her sugars in her diet which has helped Wants to keep Gabapentin PRN  Iron Def Anemia Her HGB is normal but she tried to stop Iron and got fatigued so wants to take it everyday  Increased Lipoprotein A Enrolled in study also on Statin S/p 4th Metacarpal Fracture in Left Hand after fall Has splint Other issues Patient h/o  NSTEMI 06/22 cardiac cath which showed subtotal occlusion of an LAD.  Underwent successful angioplasty and DES.  Does have residual 50% RCA lesion Her EF was 50-55% with No Wall motion Abnormalities now   H/o umbilical hernia Surgery said no issues History of osteoporosis  Hyperlipidemia, hypothyroidism,  history of peripheral neuropathy  history of shortness of breath with exertion  Past Medical History:  Diagnosis Date   Anemia 12/2015   Bronchitis, chronic (HCC)    in her 56s   Cataract    Chronic kidney disease    in 20s had nephritis   Closed fracture of  right distal humerus    right   Coronary artery disease    Hand, foot and mouth disease (HFMD)    Hearing loss 05/03/2016   High cholesterol    Hypothyroidism    Osteopenia    Pericarditis 01/1999   Pneumothorax 01/1999   Thyroid disorder    Vitamin B 12 deficiency     Past Surgical History:  Procedure Laterality Date   birthmark      removed,left leg   CARDIAC CATHETERIZATION     CATARACT EXTRACTION  2017   CORONARY STENT INTERVENTION N/A 09/01/2020   Procedure: CORONARY STENT INTERVENTION;  Surgeon: Belva Crome, MD;  Location: [place] [facility];  Service: Cardiovascular;  Laterality: N/A;   LEFT HEART CATH AND CORONARY ANGIOGRAPHY N/A 09/01/2020   Procedure: LEFT HEART CATH AND CORONARY ANGIOGRAPHY;  Surgeon: Belva Crome, MD;  Location: [place] [facility];  Service: Cardiovascular;  Laterality: N/A;   ORIF HUMERUS FRACTURE Right 04/10/2019   Procedure: RIGHT OPEN REDUCTION INTERNAL FIXATION (ORIF) DISTAL HUMERUS FRACTURE WITH EXTENSION;  Surgeon: Hiram Gash, MD;  Location: [place];  Service: Orthopedics;  Laterality: Right;   [place]?    Allergies  Allergen Reactions   Atorvastatin     Muscle weakness    Outpatient Encounter Medications as of 04/13/2022  Medication Sig  acetaminophen (TYLENOL) 650 MG CR tablet Take 1,300 mg by mouth every 8 (eight) hours as needed for pain.   Calcium Carb-Cholecalciferol (CALCIUM 600+D) 600-800 MG-UNIT TABS Take 2 tablets by mouth daily.   cholecalciferol (VITAMIN D) 1000 UNITS tablet Take 2,000 Units by mouth daily.    clobetasol ointment (TEMOVATE) 2.63 % Apply 1 Application topically 2 (two) times daily.   denosumab (PROLIA) 60 MG/ML SOSY injection Inject 60 mg into the skin every 6 (six) months.   ferrous sulfate 325 (65 FE) MG tablet Take 325 mg by mouth daily with breakfast.   l-methylfolate-B6-B12 (METANX) 3-35-2 MG TABS tablet Take 1 tablet by mouth daily. (Patient taking  differently: Take 1 tablet by mouth 2 (two) times daily.)   levothyroxine (SYNTHROID) 112 MCG tablet TAKE 1 TABLET BY MOUTH DAILY   loratadine (CLARITIN) 10 MG tablet Take 10 mg by mouth as needed.   nitroGLYCERIN (NITROSTAT) 0.4 MG SL tablet TAKE 1 TABLET UNDER THE TONGUE EVERY 5 MINUTES AS NEEDED FOR CHEST PAIN   rosuvastatin (CRESTOR) 20 MG tablet TAKE 1 TABLET(20 MG) BY MOUTH DAILY   Study - [name] - olpasiran (AMG 890) 142 mg/mL or placebo SQ injection (PI-Hilty) Inject 142 mg into the skin once. For investigational use only - Inject 1 mL (1 prefilled syringe) subcutaneously into appropriate injection site per protocol. (Approved injection site(s): upper arm, upper thigh, abdomen). Please contact [facility] if any questions.   triamcinolone cream (KENALOG) 0.1 % Apply 1 Application topically 2 (two) times daily as needed.   vitamin C (ASCORBIC ACID) 500 MG tablet Take 500 mg by mouth daily.   [DISCONTINUED] clopidogrel (PLAVIX) 75 MG tablet Take 1 tablet (75 mg total) by mouth daily.   [DISCONTINUED] gabapentin (NEURONTIN) 100 MG capsule 100 mg daily.   gabapentin (NEURONTIN) 100 MG capsule Take 1 capsule (100 mg total) by mouth at bedtime as needed.   [DISCONTINUED] calcium carbonate (OS-CAL) 1250 (500 Ca) MG chewable tablet Chew 1 tablet (1,250 mg total) by mouth daily.   No facility-administered encounter medications on file as of 04/13/2022.    Review of Systems:  Review of Systems  Constitutional:  Negative for activity change and appetite change.  HENT: Negative.    Respiratory:  Negative for cough and shortness of breath.   Cardiovascular:  Positive for leg swelling.  Gastrointestinal:  Negative for constipation.  Genitourinary: Negative.   Musculoskeletal:  Negative for arthralgias, gait problem and myalgias.  Skin: Negative.   Neurological:  Positive for numbness. Negative for dizziness and weakness.  Psychiatric/Behavioral:  Negative for confusion, dysphoric  mood and sleep disturbance.     Health Maintenance  Topic Date Due   Medicare Annual Wellness (AWV)  09/16/2020   COVID-19 Vaccine (5 - 2023-24 season) 04/29/2022 (Originally 03/08/2022)   COLONOSCOPY (Pts 45-62yr Insurance coverage will need to be confirmed)  02/03/2026 (Originally 07/11/2020)   DTaP/Tdap/Td (4 - Td or Tdap) 03/24/2032   Pneumonia Vaccine 82 Years old  Completed   INFLUENZA VACCINE  Completed   DEXA SCAN  Completed   Zoster Vaccines- Shingrix  Completed   HPV VACCINES  Aged Out    Physical Exam: Vitals:   04/13/22 0810  BP: 110/74  Pulse: 61  Resp: 16  Temp: 97.6 F (36.4 C)  TempSrc: Temporal  SpO2: 97%  Weight: 157 lb (71.2 kg)  Height: '5\' 3"'$  (1.6 m)   Body mass index is 27.81 kg/m. Physical Exam Vitals reviewed.  Constitutional:      Appearance: Normal  appearance.  HENT:     Head: Normocephalic.     Nose: Nose normal.     Mouth/Throat:     Mouth: Mucous membranes are moist.     Pharynx: Oropharynx is clear.  Eyes:     Pupils: Pupils are equal, round, and reactive to light.  Cardiovascular:     Rate and Rhythm: Normal rate and regular rhythm.     Pulses: Normal pulses.     Heart sounds: Normal heart sounds. No murmur heard. Pulmonary:     Effort: Pulmonary effort is normal.     Breath sounds: Normal breath sounds.  Abdominal:     General: Abdomen is flat. Bowel sounds are normal.     Palpations: Abdomen is soft.  Musculoskeletal:     Cervical back: Neck supple.     Comments: Mild swelling Bilateral Left more then Right  Skin:    General: Skin is warm.  Neurological:     General: No focal deficit present.     Mental Status: She is alert and oriented to person, place, and time.  Psychiatric:        Mood and Affect: Mood normal.        Thought Content: Thought content normal.     Labs reviewed: Basic Metabolic Panel: Recent Labs    05/06/21 1548 06/10/21 0810 09/23/21 0810 12/30/21 0800  NA 146*  --  138 139  K 4.8  --  4.2  4.0  CL 107*  --  105 105  CO2 22  --  22 27  GLUCOSE 95  --  79 108*  BUN 16  --  18 20  CREATININE 0.59  --  0.61 0.64  CALCIUM 9.0  --  9.0 8.6  TSH  --  1.86 1.81  --    Liver Function Tests: Recent Labs    09/23/21 0810 12/30/21 0800  AST 16 16  ALT 16 14  BILITOT 0.5 0.5  PROT 6.1 6.0*   No results for input(s): "LIPASE", "AMYLASE" in the last 8760 hours. No results for input(s): "AMMONIA" in the last 8760 hours. CBC: Recent Labs    05/06/21 1548 09/23/21 0810 12/30/21 0758  WBC 8.7 7.5 7.9  NEUTROABS  --  4,650 4,645  HGB 13.0 13.3 13.4  HCT 38.5 38.7 38.3  MCV 97 94.6 95.0  PLT 265 245 253   Lipid Panel: Recent Labs    09/23/21 0810  CHOL 139  HDL 49*  LDLCALC 72  TRIG 101  CHOLHDL 2.8   Lab Results  Component Value Date   HGBA1C 5.9 (H) 04/11/2022    Procedures since last visit: DG Hand Complete Left  Result Date: 03/24/2022 CLINICAL DATA:  Pain after fall yesterday EXAM: LEFT HAND - COMPLETE 3 VIEW COMPARISON:  None Available. FINDINGS: Osteopenia. There is a minimally displaced comminuted fracture of the midshaft of the fourth metacarpal. No additional fracture or dislocation. Minimal degenerative changes of the interphalangeal joints distally particularly of second digit with some joint space loss and tiny osteophytes. Artifact overlaps the wrist proximally. IMPRESSION: Osteopenia with comminuted minimally displaced midshaft fourth metacarpal fracture scattered degenerative change Electronically Signed   By: Jill Side M.D.   On: 03/24/2022 11:55    Assessment/Plan 1.  leg swelling Left more then right  - VAS [place] LOWER EXTREMITY VENOUS (DVT); Future  2. Prediabetes A1C good range Diet control  3. Numbness She thinks her symptoms better with low sugar Diet Can use Neurontin PR 4. Iron deficiency Hgb  normal Can continue Iron as needed  Other issues  NSTEMI (non-ST elevated myocardial infarction) (HCC) On Plavix and Statin Off Beta  Blocker now due ot low BP    Mixed hyperlipidemia On Statin     Acquired hypothyroidism TSH normal    Senile osteoporosis On Prolia DEXA 6/22   Right hip pain Tylenol PRn   Mild cognitive impairment MMSE  is 25/30 in oast Recall is good. Passed her Clock drawing Doing well  Vitamin B 12 deficiency On Supplement Umbilical hernia without obstruction and without gangrene No surgery  Labs/tests ordered:   Next appt:  04/26/2022

## 2022-04-13 NOTE — Telephone Encounter (Signed)
Patient called and stated that she was returning Jessica [name]'s call to schedule an appointment, but I see she already had one today.   Patient is requesting for Jessica [name] to call her back at [phone]

## 2022-04-14 NOTE — Telephone Encounter (Signed)
Patient was seen in office yesterday. When talking to the patient the phone disconnected and tried to return call

## 2022-04-18 ENCOUNTER — Ambulatory Visit (HOSPITAL_COMMUNITY)
Admission: RE | Admit: 2022-04-18 | Discharge: 2022-04-18 | Disposition: A | Discharge status: Home / Self Care | Payer: Medicare Other | Source: Ambulatory Visit | Attending: Internal Medicine | Admitting: Internal Medicine

## 2022-04-18 DIAGNOSIS — M7989 Other specified soft tissue disorders: Secondary | ICD-10-CM | POA: Insufficient documentation

## 2022-04-18 NOTE — Progress Notes (Signed)
  This encounter was created in error - please disregard. No show 

## 2022-04-21 ENCOUNTER — Encounter: Payer: Self-pay | Admitting: Internal Medicine

## 2022-04-21 ENCOUNTER — Ambulatory Visit: Payer: Medicare Other | Admitting: Podiatry

## 2022-04-26 ENCOUNTER — Ambulatory Visit (INDEPENDENT_AMBULATORY_CARE_PROVIDER_SITE_OTHER): Payer: Medicare Other | Admitting: *Deleted

## 2022-04-26 DIAGNOSIS — M810 Age-related osteoporosis without current pathological fracture: Secondary | ICD-10-CM | POA: Diagnosis not present

## 2022-04-26 DIAGNOSIS — S62317B Displaced fracture of base of fifth metacarpal bone. left hand, initial encounter for open fracture: Secondary | ICD-10-CM | POA: Diagnosis not present

## 2022-04-26 DIAGNOSIS — S62355B Nondisplaced fracture of shaft of fourth metacarpal bone, left hand, initial encounter for open fracture: Secondary | ICD-10-CM | POA: Diagnosis not present

## 2022-04-26 MED ORDER — DENOSUMAB 60 MG/ML ~~LOC~~ SOSY
60.0000 mg | PREFILLED_SYRINGE | Freq: Once | SUBCUTANEOUS | Status: AC
  Administered 2022-04-26: 60 mg via SUBCUTANEOUS

## 2022-04-28 ENCOUNTER — Encounter: Payer: Medicare Other | Admitting: Podiatry

## 2022-05-04 ENCOUNTER — Encounter: Payer: Self-pay | Admitting: Internal Medicine

## 2022-05-04 MED ORDER — GABAPENTIN 100 MG PO CAPS: 100.0000 mg | ORAL_CAPSULE | Freq: Three times a day (TID) | ORAL | 5 refills | Status: DC

## 2022-05-04 NOTE — Telephone Encounter (Signed)
Hello Dr.Gupta,  It appears this patients medication was change however, the medication list was not update and that is what the medical assistants review prior to approving a refill.   I have now updated it based on your reply to the patient from a mychart message on 04/21/2022, pended rx for your review and approval

## 2022-05-04 NOTE — Addendum Note (Signed)
Addended by: Logan Bores on: 05/04/2022 09:55 AM   Modules accepted: Orders

## 2022-05-23 ENCOUNTER — Encounter: Payer: Self-pay | Admitting: Internal Medicine

## 2022-05-24 DIAGNOSIS — S62355B Nondisplaced fracture of shaft of fourth metacarpal bone, left hand, initial encounter for open fracture: Secondary | ICD-10-CM | POA: Diagnosis not present

## 2022-05-24 DIAGNOSIS — S62317B Displaced fracture of base of fifth metacarpal bone. left hand, initial encounter for open fracture: Secondary | ICD-10-CM | POA: Diagnosis not present

## 2022-05-25 ENCOUNTER — Encounter: Payer: Medicare Other | Admitting: *Deleted

## 2022-05-25 VITALS — BP 131/80 | HR 57 | Temp 97.9°F | Wt 157.0 lb

## 2022-05-25 DIAGNOSIS — Z006 Encounter for examination for normal comparison and control in clinical research program: Secondary | ICD-10-CM

## 2022-05-25 MED ORDER — STUDY - OCEAN(A) - OLPASIRAN (AMG 890) 142 MG/ML OR PLACEBO SQ INJECTION (PI-HILTY)
142.0000 mg | PREFILLED_SYRINGE | Freq: Once | SUBCUTANEOUS | Status: AC
  Administered 2022-05-25: 142 mg via SUBCUTANEOUS
  Filled 2022-05-25: qty 1

## 2022-05-25 NOTE — Progress Notes (Addendum)
Ms. Alphin is in for a follow up week 55 visit.  She is doing well.  She had some swelling in the left leg which she ascribed to her gabapentin.  A venous doppler was negative for DVT.  She feels well, and no obvious side effects from taking IP.  No chest pain.    Alert, oriented older female in NAD P    57      BP 131/80       T   97.9      R 16 unlabored No JVD No carotid bruits. Lungs clear Cor regular without murmur Abdomen soft No obvious extremity edema   ECG - SB.  Low voltage QRS.  Prior ECG is not viewable.     Impression Stable week 48 Ocean (a) trial Elevated Lp(a) CAD   Continue in [place] a trial.   Marcello Moores D. Lia Foyer, MD, [place] Director, [facility]

## 2022-05-25 NOTE — Research (Signed)
[name] week 48  Patient seen in clinic today for week  48 visit of [name] trial. Patient denies any adverse events since last visit. Reviewed medications with patient and no changes noted at this visit. All procedures and  Lab work completed per protocol and patient tolerated well without complaints. IP injection was given at 09:55 in the L arm without any complications noted.  Next visit Week 60 scheduled for 08/15/2022.   PK drawn at 11:06.   [name] IP ADMINISTRATION Subject [id number] IP Box [id number] IP [id number] Time Given:09:55 Administration site: L arm   Current Outpatient Medications:    acetaminophen (TYLENOL) 650 MG CR tablet, Take 1,300 mg by mouth every 8 (eight) hours as needed for pain., Disp: , Rfl:    Calcium Carb-Cholecalciferol (CALCIUM 600+D) 600-800 MG-UNIT TABS, Take 2 tablets by mouth daily., Disp: , Rfl:    cholecalciferol (VITAMIN D) 1000 UNITS tablet, Take 2,000 Units by mouth daily. , Disp: , Rfl:    clobetasol ointment (TEMOVATE) [id number] %, Apply 1 Application topically 2 (two) times daily., Disp: , Rfl:    clopidogrel (PLAVIX) 75 MG tablet, TAKE 1 TABLET BY MOUTH DAILY, Disp: 90 tablet, Rfl: 2   denosumab (PROLIA) 60 MG/ML SOSY injection, Inject 60 mg into the skin every 6 (six) months., Disp: , Rfl:    ferrous sulfate 325 (65 FE) MG tablet, Take 325 mg by mouth daily with breakfast., Disp: , Rfl:    gabapentin (NEURONTIN) 100 MG capsule, Take 1 capsule (100 mg total) by mouth 3 (three) times daily., Disp: 90 capsule, Rfl: 5   levothyroxine (SYNTHROID) 112 MCG tablet, TAKE 1 TABLET BY MOUTH DAILY, Disp: 90 tablet, Rfl: 3   loratadine (CLARITIN) 10 MG tablet, Take 10 mg by mouth as needed., Disp: , Rfl:    nitroGLYCERIN (NITROSTAT) 0.4 MG SL tablet, TAKE 1 TABLET UNDER THE TONGUE EVERY 5 MINUTES AS NEEDED FOR CHEST PAIN, Disp: 25 tablet, Rfl: 6   rosuvastatin (CRESTOR) 20 MG tablet, TAKE 1 TABLET(20 MG) BY MOUTH DAILY, Disp: 30 tablet, Rfl: 7   Study  - [name] - olpasiran (AMG 890) 142 mg/mL or placebo SQ injection (PI-Hilty), Inject 142 mg into the skin once. For investigational use only - Inject 1 mL (1 prefilled syringe) subcutaneously into appropriate injection site per protocol. (Approved injection site(s): upper arm, upper thigh, abdomen). Please contact [facility] if any questions., Disp: , Rfl:    triamcinolone cream (KENALOG) 0.1 %, Apply 1 Application topically 2 (two) times daily as needed., Disp: , Rfl:    vitamin C (ASCORBIC ACID) 500 MG tablet, Take 500 mg by mouth daily., Disp: , Rfl:    l-methylfolate-B6-B12 (METANX) 3-35-2 MG TABS tablet, Take 1 tablet by mouth daily. (Patient not taking: Reported on 05/25/2022), Disp: 90 tablet, Rfl: 1  Current Facility-Administered Medications:    Study - [name] - olpasiran (AMG 890) 142 mg/mL or placebo SQ injection (PI-Hilty), 142 mg, Subcutaneous, Once, Hilty, Nadean Corwin, MD

## 2022-05-26 ENCOUNTER — Other Ambulatory Visit: Payer: Medicare Other

## 2022-05-30 ENCOUNTER — Other Ambulatory Visit: Payer: Medicare Other

## 2022-05-30 DIAGNOSIS — E611 Iron deficiency: Secondary | ICD-10-CM | POA: Diagnosis not present

## 2022-05-30 DIAGNOSIS — E039 Hypothyroidism, unspecified: Secondary | ICD-10-CM | POA: Diagnosis not present

## 2022-05-30 DIAGNOSIS — M810 Age-related osteoporosis without current pathological fracture: Secondary | ICD-10-CM | POA: Diagnosis not present

## 2022-05-30 DIAGNOSIS — E782 Mixed hyperlipidemia: Secondary | ICD-10-CM | POA: Diagnosis not present

## 2022-05-31 LAB — CBC WITH DIFFERENTIAL/PLATELET
Absolute Monocytes: 570 cells/uL (ref 200–950)
Basophils Absolute: 37 cells/uL (ref 0–200)
Basophils Relative: 0.5 %
Eosinophils Absolute: 148 cells/uL (ref 15–500)
Eosinophils Relative: 2 %
HCT: 39.2 % (ref 35.0–45.0)
Hemoglobin: 13.6 g/dL (ref 11.7–15.5)
Lymphs Abs: 2213 cells/uL (ref 850–3900)
MCH: 32.8 pg (ref 27.0–33.0)
MCHC: 34.7 g/dL (ref 32.0–36.0)
MCV: 94.5 fL (ref 80.0–100.0)
MPV: 11.3 fL (ref 7.5–12.5)
Monocytes Relative: 7.7 %
Neutro Abs: 4433 cells/uL (ref 1500–7800)
Neutrophils Relative %: 59.9 %
Platelets: 257 10*3/uL (ref 140–400)
RBC: 4.15 10*6/uL (ref 3.80–5.10)
RDW: 12.6 % (ref 11.0–15.0)
Total Lymphocyte: 29.9 %
WBC: 7.4 10*3/uL (ref 3.8–10.8)

## 2022-05-31 LAB — COMPLETE METABOLIC PANEL WITH GFR
AG Ratio: 1.7 (calc) (ref 1.0–2.5)
ALT: 15 U/L (ref 6–29)
AST: 15 U/L (ref 10–35)
Albumin: 4 g/dL (ref 3.6–5.1)
Alkaline phosphatase (APISO): 52 U/L (ref 37–153)
BUN/Creatinine Ratio: 28 (calc) — ABNORMAL HIGH (ref 6–22)
BUN: 16 mg/dL (ref 7–25)
CO2: 24 mmol/L (ref 20–32)
Calcium: 8.9 mg/dL (ref 8.6–10.4)
Chloride: 107 mmol/L (ref 98–110)
Creat: 0.58 mg/dL — ABNORMAL LOW (ref 0.60–0.95)
Globulin: 2.4 g/dL (calc) (ref 1.9–3.7)
Glucose, Bld: 91 mg/dL (ref 65–99)
Potassium: 4.6 mmol/L (ref 3.5–5.3)
Sodium: 140 mmol/L (ref 135–146)
Total Bilirubin: 0.5 mg/dL (ref 0.2–1.2)
Total Protein: 6.4 g/dL (ref 6.1–8.1)
eGFR: 91 mL/min/{1.73_m2} (ref >=60)

## 2022-05-31 LAB — TSH: TSH: 1.09 mIU/L (ref 0.40–4.50)

## 2022-05-31 LAB — LIPID PANEL
Cholesterol: 150 mg/dL (ref <200)
HDL: 58 mg/dL (ref >=50)
LDL Cholesterol (Calc): 75 mg/dL (calc)
Non-HDL Cholesterol (Calc): 92 mg/dL (calc) (ref <130)
Total CHOL/HDL Ratio: 2.6 (calc) (ref <5.0)
Triglycerides: 90 mg/dL (ref <150)

## 2022-06-01 ENCOUNTER — Encounter: Payer: Self-pay | Admitting: Internal Medicine

## 2022-06-01 ENCOUNTER — Non-Acute Institutional Stay: Payer: Medicare Other | Admitting: Internal Medicine

## 2022-06-01 VITALS — BP 112/68 | HR 70 | Temp 97.9°F | Resp 17 | Ht 63.0 in | Wt 159.7 lb

## 2022-06-01 DIAGNOSIS — G629 Polyneuropathy, unspecified: Secondary | ICD-10-CM | POA: Diagnosis not present

## 2022-06-01 DIAGNOSIS — R200 Anesthesia of skin: Secondary | ICD-10-CM

## 2022-06-01 DIAGNOSIS — I214 Non-ST elevation (NSTEMI) myocardial infarction: Secondary | ICD-10-CM

## 2022-06-01 DIAGNOSIS — Z1231 Encounter for screening mammogram for malignant neoplasm of breast: Secondary | ICD-10-CM

## 2022-06-01 DIAGNOSIS — E039 Hypothyroidism, unspecified: Secondary | ICD-10-CM

## 2022-06-01 DIAGNOSIS — M810 Age-related osteoporosis without current pathological fracture: Secondary | ICD-10-CM | POA: Diagnosis not present

## 2022-06-01 DIAGNOSIS — E782 Mixed hyperlipidemia: Secondary | ICD-10-CM | POA: Diagnosis not present

## 2022-06-01 NOTE — Research (Addendum)
[name] week 48 lab results:             Are there any labs that are clinically significant?  Yes [] OR No[x]  Kenneth C. Hilty, MD, FACC, FACP  [place]  [facility]  Medical Director of the [facility]  [facility] Diplomate of the American Board of Clinical Lipidology Attending Cardiologist  Direct Dial: [phone]  Fax: [phone]  Website:  [email or web address]  

## 2022-06-01 NOTE — Progress Notes (Unsigned)
Location:  [place] of Service:  Clinic (12)  Provider:   Code Status: *** Goals of Care:     06/01/2022    2:41 PM  Advanced Directives  Does Patient Have a Medical Advance Directive? Yes  Type of Advance Directive Living will;Healthcare Power of Attorney  Does patient want to make changes to medical advance directive? No - Patient declined  Copy of [place] in Chart? No - copy requested     Chief Complaint  Patient presents with   Medical Management of Chronic Issues    4 month follow up with labs. Patient is having some issues with some numb and painful toes    HPI: Patient is a 82 y.o. female seen today for medical management of chronic diseases.     Past Medical History:  Diagnosis Date   Anemia 12/2015   Bronchitis, chronic (HCC)    in her 42s   Cataract    Chronic kidney disease    in 3s had nephritis   Closed fracture of right distal humerus    right   Coronary artery disease    Hand, foot and mouth disease (HFMD)    Hearing loss 05/03/2016   High cholesterol    Hypothyroidism    Osteopenia    Pericarditis 01/1999   Pneumothorax 01/1999   Thyroid disorder    Vitamin B 12 deficiency     Past Surgical History:  Procedure Laterality Date   birthmark      removed,left leg   CARDIAC CATHETERIZATION     CATARACT EXTRACTION  2017   CORONARY STENT INTERVENTION N/A 09/01/2020   Procedure: CORONARY STENT INTERVENTION;  Surgeon: Belva Crome, MD;  Location: [facility];  Service: Cardiovascular;  Laterality: N/A;   LEFT HEART CATH AND CORONARY ANGIOGRAPHY N/A 09/01/2020   Procedure: LEFT HEART CATH AND CORONARY ANGIOGRAPHY;  Surgeon: Belva Crome, MD;  Location: [facility];  Service: Cardiovascular;  Laterality: N/A;   ORIF HUMERUS FRACTURE Right 04/10/2019   Procedure: RIGHT OPEN REDUCTION INTERNAL FIXATION (ORIF) DISTAL HUMERUS FRACTURE WITH EXTENSION;  Surgeon: Hiram Gash, MD;  Location: [facility];  Service: Orthopedics;  Laterality: Right;   [place]?    Allergies  Allergen Reactions   Atorvastatin     Muscle weakness    Outpatient Encounter Medications as of 06/01/2022  Medication Sig   acetaminophen (TYLENOL) 650 MG CR tablet Take 1,300 mg by mouth every 8 (eight) hours as needed for pain.   Calcium Carb-Cholecalciferol (CALCIUM 600+D) 600-800 MG-UNIT TABS Take 2 tablets by mouth daily.   cholecalciferol (VITAMIN D) 1000 UNITS tablet Take 2,000 Units by mouth daily.    clobetasol ointment (TEMOVATE) [id number] % Apply 1 Application topically 2 (two) times daily.   clopidogrel (PLAVIX) 75 MG tablet TAKE 1 TABLET BY MOUTH DAILY   denosumab (PROLIA) 60 MG/ML SOSY injection Inject 60 mg into the skin every 6 (six) months.   ferrous sulfate 325 (65 FE) MG tablet Take 325 mg by mouth daily with breakfast.   gabapentin (NEURONTIN) 100 MG capsule Take 1 capsule (100 mg total) by mouth 3 (three) times daily.   l-methylfolate-B6-B12 (METANX) 3-35-2 MG TABS tablet Take 1 tablet by mouth daily.   levothyroxine (SYNTHROID) 112 MCG tablet TAKE 1 TABLET BY MOUTH DAILY   loratadine (CLARITIN) 10 MG tablet Take 10 mg by mouth as needed.   nitroGLYCERIN (NITROSTAT) 0.4 MG  SL tablet TAKE 1 TABLET UNDER THE TONGUE EVERY 5 MINUTES AS NEEDED FOR CHEST PAIN   rosuvastatin (CRESTOR) 20 MG tablet TAKE 1 TABLET(20 MG) BY MOUTH DAILY   Study - [name] - olpasiran (AMG 890) 142 mg/mL or placebo SQ injection (PI-Hilty) Inject 142 mg into the skin once. For investigational use only - Inject 1 mL (1 prefilled syringe) subcutaneously into appropriate injection site per protocol. (Approved injection site(s): upper arm, upper thigh, abdomen). Please contact [facility] if any questions.   triamcinolone cream (KENALOG) 0.1 % Apply 1 Application topically 2 (two) times daily as needed.   vitamin C (ASCORBIC ACID) 500 MG tablet Take 500 mg by mouth daily.    [DISCONTINUED] calcium carbonate (OS-CAL) 1250 (500 Ca) MG chewable tablet Chew 1 tablet (1,250 mg total) by mouth daily.   No facility-administered encounter medications on file as of 06/01/2022.    Review of Systems:  Review of Systems  Health Maintenance  Topic Date Due   Medicare Annual Wellness (AWV)  09/16/2020   COVID-19 Vaccine (5 - 2023-24 season) 06/17/2022 (Originally 03/08/2022)   COLONOSCOPY (Pts 45-62yrs Insurance coverage will need to be confirmed)  02/03/2026 (Originally 07/11/2020)   DTaP/Tdap/Td (4 - Td or Tdap) 03/24/2032   Pneumonia Vaccine 84+ Years old  Completed   INFLUENZA VACCINE  Completed   DEXA SCAN  Completed   Zoster Vaccines- Shingrix  Completed   HPV VACCINES  Aged Out    Physical Exam: Vitals:   06/01/22 1438  BP: 112/68  Pulse: 70  Resp: 17  Temp: 97.9 F (36.6 C)  TempSrc: Temporal  SpO2: 92%  Weight: 159 lb 11.2 oz (72.4 kg)  Height: 5\' 3"  (1.6 m)   Body mass index is 28.29 kg/m. Physical Exam  Labs reviewed: Basic Metabolic Panel: Recent Labs    06/10/21 0810 09/23/21 0810 12/30/21 0800 05/30/22 0811  NA  --  138 139 140  K  --  4.2 4.0 4.6  CL  --  105 105 107  CO2  --  22 27 24   GLUCOSE  --  79 108* 91  BUN  --  18 20 16   CREATININE  --  0.61 0.64 0.58*  CALCIUM  --  9.0 8.6 8.9  TSH 1.86 1.81  --  1.09   Liver Function Tests: Recent Labs    09/23/21 0810 12/30/21 0800 05/30/22 0811  AST 16 16 15   ALT 16 14 15   BILITOT 0.5 0.5 0.5  PROT 6.1 6.0* 6.4   No results for input(s): "LIPASE", "AMYLASE" in the last 8760 hours. No results for input(s): "AMMONIA" in the last 8760 hours. CBC: Recent Labs    09/23/21 0810 12/30/21 0758 05/30/22 0811  WBC 7.5 7.9 7.4  NEUTROABS 4,650 4,645 4,433  HGB 13.3 13.4 13.6  HCT 38.7 38.3 39.2  MCV 94.6 95.0 94.5  PLT 245 253 257   Lipid Panel: Recent Labs    09/23/21 0810 05/30/22 0811  CHOL 139 150  HDL 49* 58  LDLCALC 72 75  TRIG 101 90  CHOLHDL 2.8 2.6   Lab  Results  Component Value Date   HGBA1C 5.9 (H) 04/11/2022    Procedures since last visit: No results found.  Assessment/Plan 1. Screening mammogram for breast cancer *** - MM 3D SCREENING MAMMOGRAM BILATERAL BREAST; Future  2. Numbness ***  3. Mixed hyperlipidemia ***  4. Acquired hypothyroidism ***  5. Senile osteoporosis ***  6. NSTEMI (non-ST elevated myocardial infarction) (HCC) ***  7.  Peripheral polyneuropathy Seen DR Krista Blue Neurontin 100 at night 100 in the mornign as needed 8 Callusin Right foot with [place] Refuses to see Podiatry Refuses Surgery    Labs/tests ordered:  * No order type specified * Next appt:  06/20/2022

## 2022-06-01 NOTE — Patient Instructions (Addendum)
Callus Removal with Salicylic Acid Need to call [facility] for Mammogram schedeuled

## 2022-06-02 ENCOUNTER — Other Ambulatory Visit: Payer: Self-pay | Admitting: Cardiovascular Disease

## 2022-06-14 DIAGNOSIS — H26493 Other secondary cataract, bilateral: Secondary | ICD-10-CM | POA: Diagnosis not present

## 2022-06-14 DIAGNOSIS — Z961 Presence of intraocular lens: Secondary | ICD-10-CM | POA: Diagnosis not present

## 2022-06-14 DIAGNOSIS — H524 Presbyopia: Secondary | ICD-10-CM | POA: Diagnosis not present

## 2022-06-20 ENCOUNTER — Encounter: Payer: Medicare Other | Admitting: Orthopedic Surgery

## 2022-06-23 ENCOUNTER — Encounter: Payer: Self-pay | Admitting: Internal Medicine

## 2022-06-23 DIAGNOSIS — Z6827 Body mass index (BMI) 27.0-27.9, adult: Secondary | ICD-10-CM | POA: Diagnosis not present

## 2022-06-23 DIAGNOSIS — H6123 Impacted cerumen, bilateral: Secondary | ICD-10-CM | POA: Diagnosis not present

## 2022-06-24 MED ORDER — LEVOTHYROXINE SODIUM 112 MCG PO TABS: 112.0000 ug | ORAL_TABLET | Freq: Every day | ORAL | 3 refills | Status: DC

## 2022-06-29 ENCOUNTER — Encounter: Payer: Self-pay | Admitting: Internal Medicine

## 2022-06-29 DIAGNOSIS — Z1231 Encounter for screening mammogram for malignant neoplasm of breast: Secondary | ICD-10-CM

## 2022-07-04 ENCOUNTER — Encounter: Payer: Medicare Other | Admitting: Orthopedic Surgery

## 2022-07-11 ENCOUNTER — Non-Acute Institutional Stay (INDEPENDENT_AMBULATORY_CARE_PROVIDER_SITE_OTHER): Payer: Medicare Other | Admitting: Orthopedic Surgery

## 2022-07-11 ENCOUNTER — Encounter: Payer: Self-pay | Admitting: Orthopedic Surgery

## 2022-07-11 VITALS — BP 122/80 | HR 62 | Temp 96.4°F | Resp 16 | Ht 63.0 in | Wt 157.4 lb

## 2022-07-11 DIAGNOSIS — Z0000 Encounter for general adult medical examination without abnormal findings: Secondary | ICD-10-CM | POA: Diagnosis not present

## 2022-07-11 NOTE — Progress Notes (Signed)
Subjective:   Jessica [name] is a 82 y.o. female who presents for Medicare Annual (Subsequent) preventive examination.  Place of Service: [facility] clinic Provider: Hazle Nordmann, AGNP-C   Review of Systems     Cardiac Risk Factors include: advanced age (>82men, >56 women);dyslipidemia     Objective:    Today's Vitals   07/11/22 1401  BP: 122/80  Pulse: 62  Resp: 16  Temp: (!) 96.4 F (35.8 C)  SpO2: 92%  Weight: 157 lb 6.4 oz (71.4 kg)  Height: 5\' 3"  (1.6 m)   Body mass index is 27.88 kg/m.     07/11/2022    2:14 PM 06/01/2022    2:41 PM 04/13/2022    8:10 AM 03/24/2022   10:26 AM 01/26/2022    1:55 PM 12/22/2021    8:24 AM 09/29/2021    2:01 PM  Advanced Directives  Does Patient Have a Medical Advance Directive? Yes Yes Yes No Yes Yes Yes  Type of Estate agent of [place];Living will Living will;Healthcare Power of Attorney Living will;Healthcare Power of [employer] Power of [place];Living will Healthcare Power of [place];Living will Healthcare Power of [place];Living will  Does patient want to make changes to medical advance directive? No - Patient declined No - Patient declined No - Patient declined    No - Patient declined  Copy of Healthcare Power of Attorney in Chart? No - copy requested No - copy requested No - copy requested  Yes - validated most recent copy scanned in chart (See row information) Yes - validated most recent copy scanned in chart (See row information) Yes - validated most recent copy scanned in chart (See row information)  Would patient like information on creating a medical advance directive?    No - Patient declined       Current Medications (verified) Outpatient Encounter Medications as of 07/11/2022  Medication Sig   acetaminophen (TYLENOL) 650 MG CR tablet Take 1,300 mg by mouth every 8 (eight) hours as needed for pain.   Calcium Carb-Cholecalciferol (CALCIUM 600+D) 600-800 MG-UNIT TABS Take 2 tablets  by mouth daily.   cholecalciferol (VITAMIN D) 1000 UNITS tablet Take 2,000 Units by mouth daily.    clobetasol ointment (TEMOVATE) 0.05 % Apply 1 Application topically 2 (two) times daily.   clopidogrel (PLAVIX) 75 MG tablet TAKE 1 TABLET BY MOUTH DAILY   denosumab (PROLIA) 60 MG/ML SOSY injection Inject 60 mg into the skin every 6 (six) months.   ferrous sulfate 325 (65 FE) MG tablet Take 325 mg by mouth daily with breakfast.   gabapentin (NEURONTIN) 100 MG capsule Take 1 capsule (100 mg total) by mouth 3 (three) times daily.   l-methylfolate-B6-B12 (METANX) 3-35-2 MG TABS tablet Take 1 tablet by mouth daily.   levothyroxine (SYNTHROID) 112 MCG tablet Take 1 tablet (112 mcg total) by mouth daily.   loratadine (CLARITIN) 10 MG tablet Take 10 mg by mouth as needed.   nitroGLYCERIN (NITROSTAT) 0.4 MG SL tablet TAKE 1 TABLET UNDER THE TONGUE EVERY 5 MINUTES AS NEEDED FOR CHEST PAIN   rosuvastatin (CRESTOR) 20 MG tablet TAKE 1 TABLET BY MOUTH DAILY   Study - [name] - olpasiran (AMG 890) 142 mg/mL or placebo SQ injection (PI-Hilty) Inject 142 mg into the skin once. For investigational use only - Inject 1 mL (1 prefilled syringe) subcutaneously into appropriate injection site per protocol. (Approved injection site(s): upper arm, upper thigh, abdomen). Please contact [facility] if any questions.  triamcinolone cream (KENALOG) 0.1 % Apply 1 Application topically 2 (two) times daily as needed.   vitamin C (ASCORBIC ACID) 500 MG tablet Take 500 mg by mouth daily.   [DISCONTINUED] calcium carbonate (OS-CAL) 1250 (500 Ca) MG chewable tablet Chew 1 tablet (1,250 mg total) by mouth daily.   No facility-administered encounter medications on file as of 07/11/2022.    Allergies (verified) Atorvastatin   History: Past Medical History:  Diagnosis Date   Anemia 12/2015   Bronchitis, chronic (HCC)    in her 14s   Cataract    Chronic kidney disease    in 20s had nephritis   Closed  fracture of right distal humerus    right   Coronary artery disease    Hand, foot and mouth disease (HFMD)    Hearing loss 05/03/2016   High cholesterol    Hypothyroidism    Osteopenia    Pericarditis 01/1999   Pneumothorax 01/1999   Thyroid disorder    Vitamin B 12 deficiency    Past Surgical History:  Procedure Laterality Date   birthmark      removed,left leg   CARDIAC CATHETERIZATION     CATARACT EXTRACTION  2017   CORONARY STENT INTERVENTION N/A 09/01/2020   Procedure: CORONARY STENT INTERVENTION;  Surgeon: Lyn Records, MD;  [facility]: [facility];  Service: Cardiovascular;  Laterality: N/A;   LEFT HEART CATH AND CORONARY ANGIOGRAPHY N/A 09/01/2020   Procedure: LEFT HEART CATH AND CORONARY ANGIOGRAPHY;  Surgeon: Lyn Records, MD;  Location: [facility];  Service: Cardiovascular;  Laterality: N/A;   ORIF HUMERUS FRACTURE Right 04/10/2019   Procedure: RIGHT OPEN REDUCTION INTERNAL FIXATION (ORIF) DISTAL HUMERUS FRACTURE WITH EXTENSION;  Surgeon: Bjorn Pippin, MD;  Location: [facility];  Service: Orthopedics;  Laterality: Right;   TONSILLECTOMY AND ADENOIDECTOMY  1952?   Family History  Problem Relation Age of Onset   Diabetes Mother    Asthma Mother    Ataxia Father    Stroke Father    Cancer - Cervical Sister    Thyroid disease Sister    Diabetes Other    Diabetes Maternal Grandmother    Arthritis Paternal Grandmother    Colon cancer Neg Hx    Social History   Socioeconomic History   Marital status: Divorced    Spouse name: Not on file   Number of children: 1   Years of education: college   Highest education level: Not on file  Occupational History   Occupation: retired  Tobacco Use   Smoking status: Never   Smokeless tobacco: Never  Vaping Use   Vaping Use: Never used  Substance and Sexual Activity   Alcohol use: Yes    Alcohol/week: 3.0 standard drinks of alcohol    Types: 3 Glasses of wine per week    Comment: social    Drug use: No   Sexual activity: Not on file  Other Topics Concern   Not on file  Social History Narrative   Moved to [facility] 01/14/2015   Do you drink/eat things with caffeine?  yes   Marital status?      divorced                               Do you live in a house, apartment, assisted living, condo, trailer, etc.?  Retirement community   Is it one or more stories? Yes, 3  How many persons live in your home?  1   Do you have any pets in your home? (please list) 0   Current or past profession:  Librarian   Do you exercise?     yes                                 Type & how often? Walk-varies   Do you have a living will? yes   Do you have a DNR form?         no                         If not, do you want to discuss one? yes   Do you have signed POA/HPOA for forms? yes   Social Determinants of Health   Financial Resource Strain: Low Risk  (07/11/2022)   Overall Financial Resource Strain (CARDIA)    Difficulty of Paying Living Expenses: Not hard at all  Food Insecurity: No Food Insecurity (07/11/2022)   Hunger Vital Sign    Worried About Running Out of Food in the Last Year: Never true    Ran Out of Food in the Last Year: Never true  Transportation Needs: No Transportation Needs (03/29/2017)   PRAPARE - Administrator, Civil Service (Medical): No    Lack of Transportation (Non-Medical): No  Physical Activity: Insufficiently Active (07/11/2022)   Exercise Vital Sign    Days of Exercise per Week: 3 days    Minutes of Exercise per Session: 30 min  Stress: No Stress Concern Present (07/11/2022)   [employer] of Occupational Health - Occupational Stress Questionnaire    Feeling of Stress : Only a little  Social Connections: Moderately Integrated (07/11/2022)   Social Connection and Isolation Panel [NHANES]    Frequency of Communication with Friends and Family: Once a week    Frequency of Social Gatherings with Friends and Family: Three times a week    Attends  Religious Services: More than 4 times per year    Active Member of Clubs or Organizations: Yes    Attends Banker Meetings: Never    Marital Status: Widowed    Tobacco Counseling Counseling given: Not Answered   Clinical Intake:  Pre-visit preparation completed: No  Pain : No/denies pain     BMI - recorded: 27.88 Nutritional Status: BMI 25 -29 Overweight Nutritional Risks: None Diabetes: No  How often do you need to have someone help you when you read instructions, pamphlets, or other written materials from your doctor or pharmacy?: 1 - Never What is the last grade level you completed in school?: Post grad  Diabetic?No  Interpreter Needed?: No      Activities of Daily Living    07/11/2022    2:24 PM  In your present state of health, do you have any difficulty performing the following activities:  Hearing? 0  Vision? 0  Difficulty concentrating or making decisions? 0  Walking or climbing stairs? 0  Dressing or bathing? 0  Doing errands, shopping? 0  Preparing Food and eating ? N  Using the Toilet? N  In the past six months, have you accidently leaked urine? Y  Do you have problems with loss of bowel control? N  Managing your Medications? N  Managing your Finances? N  Housekeeping or managing your Housekeeping? N    Patient Care Team: Mahlon Gammon, MD  as PCP - General (Internal Medicine) Tonny Bollman, MD as PCP - Cardiology (Cardiology) Bernette Redbird, MD as Consulting Physician (Gastroenterology) Antony Contras, MD as Consulting Physician (Ophthalmology) [place], Annalis Kaczmarczyk, MD as Consulting Physician (Dermatology) Mast, Man X, NP as Nurse Practitioner (Internal Medicine)  Indicate any recent Medical Services you may have received from other than [facility] providers in the past year (date may be approximate).     Assessment:   This is a routine wellness examination for [facility].  Hearing/Vision screen Hearing Screening - Comments:: No hearing  concerns.  Vision Screening - Comments:: No vision concerns. Patient last eye exam April 2024. Patient wears prescription glasses.   Dietary issues and exercise activities discussed: Current Exercise Habits: The patient does not participate in regular exercise at present, Exercise limited by: cardiac condition(s);orthopedic condition(s)   Goals Addressed             This Visit's Progress    Patient Stated   On track    The patient will maintain and improve current level of wellness.        Depression Screen    07/11/2022    2:09 PM 06/01/2022    2:41 PM 04/13/2022    8:10 AM 01/26/2022    1:55 PM 01/13/2021   11:55 AM 11/17/2020    9:12 AM 09/17/2019    1:52 PM  PHQ 2/9 Scores  PHQ - 2 Score 0 0 0 1 0 0 0  PHQ- 9 Score       6    Fall Risk    07/11/2022    2:24 PM 07/11/2022    2:08 PM 06/01/2022    2:40 PM 04/13/2022    8:10 AM 01/26/2022    1:55 PM  Fall Risk   Falls in the past year? 1 1 1 1  0  Number falls in past yr: 0 0 0 1 0  Injury with Fall? 0 1 1  0  Risk for fall due to : History of fall(s);Impaired balance/gait;Impaired mobility History of fall(s);Impaired balance/gait;Impaired mobility History of fall(s) History of fall(s) History of fall(s)  Follow up Falls evaluation completed;Education provided;Falls prevention discussed Falls evaluation completed;Education provided;Falls prevention discussed  Falls evaluation completed Falls evaluation completed    FALL RISK PREVENTION PERTAINING TO THE HOME:  Any stairs in or around the home? No  If so, are there any without handrails? No  Home free of loose throw rugs in walkways, pet beds, electrical cords, etc? Yes  Adequate lighting in your home to reduce risk of falls? Yes   ASSISTIVE DEVICES UTILIZED TO PREVENT FALLS:  Life alert? Yes  Use of a cane, walker or w/c? Yes  Grab bars in the bathroom? Yes  Shower chair or bench in shower? Yes  Elevated toilet seat or a handicapped toilet? Yes   TIMED UP AND  GO:  Was the test performed? No .  Length of time to ambulate 10 feet: N/A sec.   Gait slow and steady without use of assistive device  Cognitive Function:    07/11/2022    2:09 PM 12/07/2020    4:40 PM 09/17/2019    2:10 PM 11/20/2017    2:19 PM 03/29/2017    8:55 AM  MMSE - Mini Mental State Exam  Orientation to time 5 5 5 4 5   Orientation to Place 4 5 5 5 5   Registration 3 3 3 3 3   Attention/ Calculation 5 0 0 5 5  Recall 3 3 3 3 3   Language-  name 2 objects 2 2 2 2 2   Language- repeat 1 1 1 1 1   Language- follow 3 step command 3 3 3 3 3   Language- read & follow direction 1 1 1 1 1   Write a sentence 1 1 1 1 1   Copy design 1 1 1 1 1   Total score 29 25 25 29 30         Immunizations Immunization History  Administered Date(s) Administered   Influenza Whole 12/29/2006   Influenza, High Dose Seasonal PF 11/21/2016, 11/20/2017, 11/01/2018, 11/10/2020   Influenza,inj,Quad PF,6+ Mos 12/04/2013, 11/12/2014, 11/18/2015   Influenza-Unspecified 12/02/2011, 11/15/2012, 12/02/2016, 11/26/2019, 12/20/2021   Moderna Covid-19 Vaccine Bivalent Booster 7yrs & up 01/11/2022   Moderna SARS-COV2 Booster Vaccination 01/01/2020, 07/06/2020   Moderna Sars-Covid-2 Vaccination 03/11/2019, 04/08/2019   PFIZER(Purple Top)SARS-COV-2 Vaccination 11/25/2020   PPD Test 01/05/2015   Pneumococcal Conjugate-13 08/19/2014   Pneumococcal Polysaccharide-23 11/18/2015   Td 06/02/2008   Tdap 05/31/2018, 03/24/2022   Unspecified SARS-COV-2 Vaccination 12/10/2021   Zoster Recombinat (Shingrix) 01/06/2016, 05/02/2016   Zoster, Live 03/08/2011    TDAP status: Up to date  Flu Vaccine status: Up to date  Pneumococcal vaccine status: Up to date  Covid-19 vaccine status: Completed vaccines  Qualifies for Shingles Vaccine? Yes   Zostavax completed Yes   Shingrix Completed?: Yes  Screening Tests Health Maintenance  Topic Date Due   COVID-19 Vaccine (5 - 2023-24 season) 03/08/2022   COLONOSCOPY (Pts  45-71yrs Insurance coverage will need to be confirmed)  02/03/2026 (Originally 07/11/2020)   INFLUENZA VACCINE  10/06/2022   Medicare Annual Wellness (AWV)  07/11/2023   DTaP/Tdap/Td (4 - Td or Tdap) 03/24/2032   Pneumonia Vaccine 66+ Years old  Completed   DEXA SCAN  Completed   Zoster Vaccines- Shingrix  Completed   HPV VACCINES  Aged Out    Health Maintenance  Health Maintenance Due  Topic Date Due   COVID-19 Vaccine (5 - 2023-24 season) 03/08/2022    Colorectal cancer screening: No longer required.   Mammogram status: No longer required due to advanced age.  Bone Density status: Completed 08/2020. Results reflect: Bone density results: OSTEOPOROSIS. Repeat every 08/2020 years.  Lung Cancer Screening: (Low Dose CT Chest recommended if Age 37-80 years, 30 pack-year currently smoking OR have quit w/in 15years.) does not qualify.   Lung Cancer Screening Referral: No  Additional Screening:  Hepatitis C Screening: does not qualify; Completed   Vision Screening: Recommended annual ophthalmology exams for early detection of glaucoma and other disorders of the eye. Is the patient up to date with their annual eye exam?  Yes  Who is the provider or what is the name of the office in which the patient attends annual eye exams? [facility] If pt is not established with a provider, would they like to be referred to a provider to establish care? No .   Dental Screening: Recommended annual dental exams for proper oral hygiene  Community Resource Referral / Chronic Care Management: CRR required this visit?  No   CCM required this visit?  No      Plan:     I have personally reviewed and noted the following in the patient's chart:   Medical and social history Use of alcohol, tobacco or illicit drugs  Current medications and supplements including opioid prescriptions. Patient is not currently taking opioid prescriptions. Functional ability and status Nutritional  status Physical activity Advanced directives List of other physicians Hospitalizations, surgeries, and ER visits in previous 12 months  Vitals Screenings to include cognitive, depression, and falls Referrals and appointments  In addition, I have reviewed and discussed with patient certain preventive protocols, quality metrics, and best practice recommendations. A written personalized care plan for preventive services as well as general preventive health recommendations were provided to patient.     Octavia Heir, NP   07/11/2022   Nurse Notes: She is not interested in mammogram or bone density at this time. UTD on vaccinations. Please call [facility] [phone] to schedule

## 2022-07-11 NOTE — Patient Instructions (Signed)
  Ms. Kahele , Thank you for taking time to come for your Medicare Wellness Visit. I appreciate your ongoing commitment to your health goals. Please review the following plan we discussed and let me know if I can assist you in the future.   These are the goals we discussed:  Goals      Patient Stated     The patient will maintain and improve current level of wellness.      Weight (lb) < 145 lb (65.8 kg)     Pt would like to exercise and work on portion control        This is a list of the screening recommended for you and due dates:  Health Maintenance  Topic Date Due   COVID-19 Vaccine (5 - 2023-24 season) 03/08/2022   Colon Cancer Screening  02/03/2026*   Flu Shot  10/06/2022   Medicare Annual Wellness Visit  07/11/2023   DTaP/Tdap/Td vaccine (4 - Td or Tdap) 03/24/2032   Pneumonia Vaccine  Completed   DEXA scan (bone density measurement)  Completed   Zoster (Shingles) Vaccine  Completed   HPV Vaccine  Aged Out  *Topic was postponed. The date shown is not the original due date.   She is not interested in mammogram or bone density at this time. UTD on vaccinations. Please call [facility] [phone] to schedule

## 2022-07-25 NOTE — Telephone Encounter (Signed)
Please update the mammo order, it says external [facility] for preferred location.

## 2022-07-27 NOTE — Telephone Encounter (Signed)
Order updated as requested.

## 2022-08-03 ENCOUNTER — Ambulatory Visit: Payer: Medicare Other | Attending: Cardiovascular Disease | Admitting: Cardiovascular Disease

## 2022-08-03 ENCOUNTER — Encounter: Payer: Self-pay | Admitting: Cardiovascular Disease

## 2022-08-03 VITALS — BP 114/72 | HR 67 | Ht 63.0 in | Wt 158.6 lb

## 2022-08-03 DIAGNOSIS — E782 Mixed hyperlipidemia: Secondary | ICD-10-CM | POA: Insufficient documentation

## 2022-08-03 DIAGNOSIS — I2510 Atherosclerotic heart disease of native coronary artery without angina pectoris: Secondary | ICD-10-CM | POA: Insufficient documentation

## 2022-08-03 DIAGNOSIS — R0609 Other forms of dyspnea: Secondary | ICD-10-CM | POA: Diagnosis not present

## 2022-08-03 NOTE — Patient Instructions (Addendum)
Medication Instructions:  Your physician recommends that you continue on your current medications as directed. Please refer to the Current Medication list given to you today.  *If you need a refill on your cardiac medications before your next appointment, please call your pharmacy*  Lab Work: If you have labs (blood work) drawn today and your tests are completely normal, you will receive your results only by: MyChart Message (if you have MyChart) OR A paper copy in the mail If you have any lab test that is abnormal or we need to change your treatment, we will call you to review the results.  Testing/Procedures: Your physician has requested that you have an echocardiogram. Echocardiography is a painless test that uses sound waves to create images of your heart. It provides your doctor with information about the size and shape of your heart and how well your heart's chambers and valves are working. This procedure takes approximately one hour. There are no restrictions for this procedure. Please do NOT wear cologne, perfume, aftershave, or lotions (deodorant is allowed). Please arrive 15 minutes prior to your appointment time.  Follow-Up: At [facility] you and your health needs are our priority.  As part of our continuing mission to provide you with exceptional heart care, we have created designated Provider Care Teams.  These Care Teams include your primary Cardiologist (physician) and Advanced Practice Providers (APPs -  Physician Assistants and Nurse Practitioners) who all work together to provide you with the care you need, when you need it.  We recommend signing up for the patient portal called "MyChart".  Sign up information is provided on this After Visit Summary.  MyChart is used to connect with patients for Virtual Visits (Telemedicine).  Patients are able to view lab/test results, encounter notes, upcoming appointments, etc.  Non-urgent messages can be sent to your provider as  well.   To learn more about what you can do with MyChart, go to [email or web address].    Your next appointment:   6 months   Provider:   Tonny Bollman, MD

## 2022-08-03 NOTE — Progress Notes (Unsigned)
Cardiology Office Note:    Date:  08/03/2022   ID:  Jessica [name], DOB 10/03/1940, MRN [id number]  PCP:  Mahlon Gammon, MD   [facility] Providers Cardiologist:  Tonny Bollman, MD     Referring MD: Mahlon Gammon, MD   Chief Complaint  Patient presents with   Shortness of Breath    History of Present Illness:    Jessica [name] is a 82 y.o. female with a hx of  coronary artery disease, presenting for follow-up evaluation.  Patient has a remote history of pericarditis in 2000.  Other medical problems include hyperlipidemia and hypothyroidism.  She presented with a non-ST elevation infarction in 2022 and was found to have subtotal occlusion of the LAD treated with a drug-eluting stent.  She initially had an LVEF in the 40 to 45% range but initially improved to an LVEF of 50 to 55% on follow-up echo assessment, then improved further on an echo in 2023 demonstrating an LVEF of 55 to 60% with no regional wall motion abnormalities. At the time of her last cardiology visit in October 2023, ASA and metoprolol were discontinued. She remains on clopidogrel for antiplatelet therapy. The patient is enrolled in the [place] A trial.   The patient is here alone today. She continues to complain of fatigue and exertional dyspnea. No chest pain or pressure. No orthopnea, PND, or edema. No heart palpitations. She is short of breath with low level activity at times, but some days can walk 1.5 miles without too much trouble. She wonders if her lower activity level is contributing to her symptoms. She is taking gabapentin at low dose for neuropathy and feels like this has really helped her.   Past Medical History:  Diagnosis Date   Anemia 12/2015   Bronchitis, chronic (HCC)    in her 14s   Cataract    Chronic kidney disease    in 89s had nephritis   Closed fracture of right distal humerus    right   Coronary artery disease    Hand, foot and mouth disease (HFMD)    Hearing loss  05/03/2016   High cholesterol    Hypothyroidism    Osteopenia    Pericarditis 01/1999   Pneumothorax 01/1999   Thyroid disorder    Vitamin B 12 deficiency     Past Surgical History:  Procedure Laterality Date   birthmark      removed,left leg   CARDIAC CATHETERIZATION     CATARACT EXTRACTION  2017   CORONARY STENT INTERVENTION N/A 09/01/2020   Procedure: CORONARY STENT INTERVENTION;  Surgeon: Lyn Records, MD;  Location: [facility];  Service: Cardiovascular;  Laterality: N/A;   LEFT HEART CATH AND CORONARY ANGIOGRAPHY N/A 09/01/2020   Procedure: LEFT HEART CATH AND CORONARY ANGIOGRAPHY;  Surgeon: Lyn Records, MD;  Location: [facility];  Service: Cardiovascular;  Laterality: N/A;   ORIF HUMERUS FRACTURE Right 04/10/2019   Procedure: RIGHT OPEN REDUCTION INTERNAL FIXATION (ORIF) DISTAL HUMERUS FRACTURE WITH EXTENSION;  Surgeon: Bjorn Pippin, MD;  Location: [facility];  Service: Orthopedics;  Laterality: Right;   TONSILLECTOMY AND ADENOIDECTOMY  1952?    Current Medications: Current Meds  Medication Sig   acetaminophen (TYLENOL) 650 MG CR tablet Take 1,300 mg by mouth every 8 (eight) hours as needed for pain.   Calcium Carb-Cholecalciferol (CALCIUM 600+D) 600-800 MG-UNIT TABS Take 2 tablets by mouth daily.   cholecalciferol (VITAMIN D) 1000 UNITS tablet Take  2,000 Units by mouth daily.    clobetasol ointment (TEMOVATE) 0.05 % Apply 1 Application topically as needed.   clopidogrel (PLAVIX) 75 MG tablet TAKE 1 TABLET BY MOUTH DAILY   denosumab (PROLIA) 60 MG/ML SOSY injection Inject 60 mg into the skin every 6 (six) months.   ferrous sulfate 325 (65 FE) MG tablet Take 325 mg by mouth daily with breakfast.   gabapentin (NEURONTIN) 100 MG capsule Take 1 capsule (100 mg total) by mouth 3 (three) times daily. (Patient taking differently: Take 100 mg by mouth as needed.)   levothyroxine (SYNTHROID) 112 MCG tablet Take 1 tablet (112 mcg total) by mouth  daily.   loratadine (CLARITIN) 10 MG tablet Take 10 mg by mouth as needed.   nitroGLYCERIN (NITROSTAT) 0.4 MG SL tablet TAKE 1 TABLET UNDER THE TONGUE EVERY 5 MINUTES AS NEEDED FOR CHEST PAIN   rosuvastatin (CRESTOR) 20 MG tablet TAKE 1 TABLET BY MOUTH DAILY   Study - [name] - olpasiran (AMG 890) 142 mg/mL or placebo SQ injection (PI-Hilty) Inject 142 mg into the skin once. For investigational use only - Inject 1 mL (1 prefilled syringe) subcutaneously into appropriate injection site per protocol. (Approved injection site(s): upper arm, upper thigh, abdomen). Please contact [facility] if any questions.   triamcinolone cream (KENALOG) 0.1 % Apply 1 Application topically 2 (two) times daily as needed.   vitamin C (ASCORBIC ACID) 500 MG tablet Take 500 mg by mouth daily.   [DISCONTINUED] l-methylfolate-B6-B12 (METANX) 3-35-2 MG TABS tablet Take 1 tablet by mouth daily.     Allergies:   Atorvastatin   Social History   Socioeconomic History   Marital status: Divorced    Spouse name: Not on file   Number of children: 1   Years of education: college   Highest education level: Not on file  Occupational History   Occupation: retired  Tobacco Use   Smoking status: Never   Smokeless tobacco: Never  Vaping Use   Vaping Use: Never used  Substance and Sexual Activity   Alcohol use: Yes    Alcohol/week: 3.0 standard drinks of alcohol    Types: 3 Glasses of wine per week    Comment: social   Drug use: No   Sexual activity: Not on file  Other Topics Concern   Not on file  Social History Narrative   Moved to [place] 01/14/2015   Do you drink/eat things with caffeine?  yes   Marital status?      divorced                               Do you live in a house, apartment, assisted living, condo, trailer, etc.?  Retirement community   Is it one or more stories? Yes, 3   How many persons live in your home?  1   Do you have any pets in your home? (please list) 0    Current or past profession:  Librarian   Do you exercise?     yes                                 Type & how often? Walk-varies   Do you have a living will? yes   Do you have a DNR form?         no  If not, do you want to discuss one? yes   Do you have signed POA/HPOA for forms? yes   Social Determinants of Health   Financial Resource Strain: Low Risk  (07/11/2022)   Overall Financial Resource Strain (CARDIA)    Difficulty of Paying Living Expenses: Not hard at all  Food Insecurity: No Food Insecurity (07/11/2022)   Hunger Vital Sign    Worried About Running Out of Food in the Last Year: Never true    Ran Out of Food in the Last Year: Never true  Transportation Needs: No Transportation Needs (03/29/2017)   PRAPARE - Administrator, Civil Service (Medical): No    Lack of Transportation (Non-Medical): No  Physical Activity: Insufficiently Active (07/11/2022)   Exercise Vital Sign    Days of Exercise per Week: 3 days    Minutes of Exercise per Session: 30 min  Stress: No Stress Concern Present (07/11/2022)   [employer] of Occupational Health - Occupational Stress Questionnaire    Feeling of Stress : Only a little  Social Connections: Moderately Integrated (07/11/2022)   Social Connection and Isolation Panel [NHANES]    Frequency of Communication with Friends and Family: Once a week    Frequency of Social Gatherings with Friends and Family: Three times a week    Attends Religious Services: More than 4 times per year    Active Member of Clubs or Organizations: Yes    Attends Banker Meetings: Never    Marital Status: Widowed     Family History: The patient's family history includes Arthritis in her paternal grandmother; Asthma in her mother; Ataxia in her father; Cancer - Cervical in her sister; Diabetes in her maternal grandmother, mother, and another family member; Stroke in her father; Thyroid disease in her sister. There is no history of  Colon cancer.  ROS:   Please see the history of present illness.    All other systems reviewed and are negative.  EKGs/Labs/Other Studies Reviewed:    The following studies were reviewed today: Cardiac Studies & Procedures   CARDIAC CATHETERIZATION  CARDIAC CATHETERIZATION 09/01/2020  Narrative  Dist LAD lesion is 75% stenosed.   Subtotal occlusion of the mid LAD with distal TIMI grade II flow.  Stenosis is a [place] 010 bifurcation stenosis with the first diagonal.  Successful angioplasty and stenting using a 22 x 2.0 mm Onyx postdilated to 2.25 mm in diameter, avoiding jailing the larger first diagonal.  0% stenosis with TIMI grade III flow was noted.  Tandem 50 and 40% proximal and mid RCA stenoses.  Right coronary is codominant.  Left main is widely patent.  Circumflex is widely patent.  The circumflex and obtuse marginals are tortuous.  Apical severe hypokinesis/akinesis.  LVEDP 6 mmHg.   RECOMMENDATIONS:   Aspirin and Brilinta for 12 months.  Aggressive risk factor modification  High intensity statin therapy  Findings Coronary Findings Diagnostic  Dominance: Co-dominant  Left Anterior Descending Vessel is small. Mid LAD lesion is 99% stenosed. Dist LAD lesion is 75% stenosed.  First Diagonal Branch Vessel is small in size.  Right Coronary Artery Prox RCA-1 lesion is 40% stenosed. Prox RCA-2 lesion is 50% stenosed.  Intervention  Mid LAD lesion Stent A stent was successfully placed. Post-Intervention Lesion Assessment The intervention was successful. There is a 0% residual stenosis post intervention.     ECHOCARDIOGRAM  ECHOCARDIOGRAM COMPLETE 05/14/2021  Narrative ECHOCARDIOGRAM REPORT    Patient Name:   Jessica [name] Date of Exam: 05/14/2021  Medical Rec #:  [id number]         Height:       63.0 in Accession #:    [id number]        Weight:       154.6 lb Date of Birth:  11/01/1940         BSA:          1.733 m Patient Age:    80  years          BP:           128/78 mmHg Patient Gender: F                 HR:           63 bpm. Exam Location:  [address]  Procedure: 2D Echo, Cardiac Doppler and Color Doppler  Indications:    R06.00 SOB  History:        Patient has prior history of Echocardiogram examinations, most recent 10/07/2020. CAD and Previous Myocardial Infarction, Signs/Symptoms:Shortness of Breath; Risk Factors:Dyslipidemia.  Sonographer:    Samule Ohm RDCS Referring Phys: [phone] [facility]   Sonographer Comments: Suboptimal parasternal window. IMPRESSIONS   1. Left ventricular ejection fraction, by estimation, is 55 to 60%. The left ventricle has normal function. The left ventricle has no regional wall motion abnormalities. There is mild concentric left ventricular hypertrophy. Left ventricular diastolic parameters were normal. 2. Right ventricular systolic function is normal. The right ventricular size is normal. 3. The mitral valve is normal in structure. No evidence of mitral valve regurgitation. No evidence of mitral stenosis. 4. Tricuspid valve regurgitation is mild to moderate. 5. The aortic valve is normal in structure. Aortic valve regurgitation is not visualized. No aortic stenosis is present. 6. The inferior vena cava is normal in size with greater than 50% respiratory variability, suggesting right atrial pressure of 3 mmHg.  Comparison(s): A prior study was performed on 10/07/2020. Changes from prior study are noted. LVEF low normal on prior study, now LVEF is normal.  FINDINGS Left Ventricle: Left ventricular ejection fraction, by estimation, is 55 to 60%. The left ventricle has normal function. The left ventricle has no regional wall motion abnormalities. The left ventricular internal cavity size was normal in size. There is mild concentric left ventricular hypertrophy. Left ventricular diastolic parameters were normal.  Right Ventricle: The right ventricular size is normal. No  increase in right ventricular wall thickness. Right ventricular systolic function is normal.  Left Atrium: Left atrial size was normal in size.  Right Atrium: Right atrial size was normal in size.  Pericardium: There is no evidence of pericardial effusion. Presence of epicardial fat layer.  Mitral Valve: The mitral valve is normal in structure. No evidence of mitral valve regurgitation. No evidence of mitral valve stenosis.  Tricuspid Valve: The tricuspid valve is normal in structure. Tricuspid valve regurgitation is mild to moderate. No evidence of tricuspid stenosis.  Aortic Valve: The aortic valve is normal in structure. Aortic valve regurgitation is not visualized. Aortic regurgitation PHT measures 439 msec. No aortic stenosis is present.  Pulmonic Valve: The pulmonic valve was normal in structure. Pulmonic valve regurgitation is not visualized. No evidence of pulmonic stenosis.  Aorta: The aortic root is normal in size and structure.  Venous: The inferior vena cava is normal in size with greater than 50% respiratory variability, suggesting right atrial pressure of 3 mmHg.  IAS/Shunts: No atrial level shunt detected by color flow Doppler.   LEFT VENTRICLE PLAX  2D LVIDd:         4.10 cm   Diastology LVIDs:         2.90 cm   LV e' medial:    8.81 cm/s LV PW:         1.10 cm   LV E/e' medial:  7.6 LV IVS:        1.10 cm   LV e' lateral:   11.50 cm/s LVOT diam:     1.80 cm   LV E/e' lateral: 5.8 LV SV:         71 LV SV Index:   41 LVOT Area:     2.54 cm   RIGHT VENTRICLE            IVC RVSP:           25.7 mmHg  IVC diam: 0.80 cm  LEFT ATRIUM             Index        RIGHT ATRIUM           Index LA diam:        2.80 cm 1.62 cm/m   RA Pressure: 3.00 mmHg LA Vol (A2C):   40.4 ml 23.31 ml/m  RA Area:     8.49 cm LA Vol (A4C):   29.6 ml 17.08 ml/m  RA Volume:   18.80 ml  10.85 ml/m LA Biplane Vol: 34.2 ml 19.73 ml/m AORTIC VALVE LVOT Vmax:   136.00 cm/s LVOT Vmean:   86.100 cm/s LVOT VTI:    0.280 m AI PHT:      439 msec  AORTA Ao Root diam: 3.00 cm Ao Asc diam:  2.90 cm  MITRAL VALVE                TRICUSPID VALVE MV Area (PHT): 2.78 cm     TR Peak grad:   22.7 mmHg MV Decel Time: 273 msec     TR Vmax:        238.00 cm/s MV E velocity: 66.60 cm/s   Estimated RAP:  3.00 mmHg MV A velocity: 104.00 cm/s  RVSP:           25.7 mmHg MV E/A ratio:  0.64 SHUNTS Systemic VTI:  0.28 m Systemic Diam: 1.80 cm  Kardie Tobb DO Electronically signed by Thomasene Ripple DO Signature Date/Time: 05/14/2021/5:00:31 PM    Final              EKG:  EKG is ordered today.  The ekg ordered today demonstrates NSR 67 bpm, possible age-indeterminate inferior and anterior infarcts  Recent Labs: 05/30/2022: ALT 15; BUN 16; Creat 0.58; Hemoglobin 13.6; Platelets 257; Potassium 4.6; Sodium 140; TSH 1.09  Recent Lipid Panel    Component Value Date/Time   CHOL 150 05/30/2022 0811   CHOL 149 03/16/2021 0920   TRIG 90 05/30/2022 0811   HDL 58 05/30/2022 0811   HDL 56 03/16/2021 0920   CHOLHDL 2.6 05/30/2022 0811   VLDL 18 02/24/2014 0926   LDLCALC 75 05/30/2022 0811   LDLDIRECT 135.4 12/26/2006 0847     Risk Assessment/Calculations:                Physical Exam:    VS:  BP 114/72   Pulse 67   Ht 5\' 3"  (1.6 m)   Wt 158 lb 9.6 oz (71.9 kg)   SpO2 97%   BMI 28.09 kg/m     Wt Readings from Last 3 Encounters:  08/03/22 158  lb 9.6 oz (71.9 kg)  07/11/22 157 lb 6.4 oz (71.4 kg)  06/01/22 159 lb 11.2 oz (72.4 kg)     GEN:  Well nourished, well developed in no acute distress HEENT: Normal NECK: No JVD; No carotid bruits LYMPHATICS: No lymphadenopathy CARDIAC: RRR, no murmurs, rubs, gallops RESPIRATORY:  Clear to auscultation without rales, wheezing or rhonchi  ABDOMEN: Soft, non-tender, non-distended MUSCULOSKELETAL:  No edema; No deformity  SKIN: Warm and dry NEUROLOGIC:  Alert and oriented x 3 PSYCHIATRIC:  Normal affect   ASSESSMENT:    1.  Coronary artery disease involving native coronary artery of native heart without angina pectoris   2. Mixed hyperlipidemia   3. Dyspnea on exertion    PLAN:    In order of problems listed above:  Stable without angina. No improvement in fatigue after stopping beta blocker last year. Continue statin and clopdogrel. Follow-up 6 months. Treated with rosuvastatin. LDL 75 mg/dL. Considering age and generalized complaints, think best to minimize medications and continue current Rx.  Discussed age, deconditioning as likely contributors. Check 2D echo to evaluate for cardiac dysfunction. No evidence of heart failure on exam. Discussed importance of regular exercise, strengthening.   Medication Adjustments/Labs and Tests Ordered: Current medicines are reviewed at length with the patient today.  Concerns regarding medicines are outlined above.  No orders of the defined types were placed in this encounter.  No orders of the defined types were placed in this encounter.   Patient Instructions  Medication Instructions:  Your physician recommends that you continue on your current medications as directed. Please refer to the Current Medication list given to you today.  *If you need a refill on your cardiac medications before your next appointment, please call your pharmacy*  Lab Work: If you have labs (blood work) drawn today and your tests are completely normal, you will receive your results only by: MyChart Message (if you have MyChart) OR A paper copy in the mail If you have any lab test that is abnormal or we need to change your treatment, we will call you to review the results.  Testing/Procedures: None ordered today.  Follow-Up: At [facility], you and your health needs are our priority.  As part of our continuing mission to provide you with exceptional heart care, we have created designated Provider Care Teams.  These Care Teams include your primary Cardiologist (physician) and  Advanced Practice Providers (APPs -  Physician Assistants and Nurse Practitioners) who all work together to provide you with the care you need, when you need it.  We recommend signing up for the patient portal called "MyChart".  Sign up information is provided on this After Visit Summary.  MyChart is used to connect with patients for Virtual Visits (Telemedicine).  Patients are able to view lab/test results, encounter notes, upcoming appointments, etc.  Non-urgent messages can be sent to your provider as well.   To learn more about what you can do with MyChart, go to [email or web address].    Your next appointment:   1 year(s)  Provider:   Tonny Bollman, MD        Signed, Tonny Bollman, MD  08/03/2022 11:17 AM    [facility]

## 2022-08-06 DIAGNOSIS — Z23 Encounter for immunization: Secondary | ICD-10-CM | POA: Diagnosis not present

## 2022-08-15 ENCOUNTER — Encounter: Payer: Self-pay | Admitting: Orthopedic Surgery

## 2022-08-15 ENCOUNTER — Non-Acute Institutional Stay (INDEPENDENT_AMBULATORY_CARE_PROVIDER_SITE_OTHER): Payer: Medicare Other | Admitting: Orthopedic Surgery

## 2022-08-15 VITALS — BP 130/86 | HR 67 | Temp 96.5°F | Resp 16 | Ht 63.0 in | Wt 157.8 lb

## 2022-08-15 DIAGNOSIS — N644 Mastodynia: Secondary | ICD-10-CM

## 2022-08-15 NOTE — Patient Instructions (Signed)
Please schedule mammogram with [facility]> [phone]

## 2022-08-15 NOTE — Progress Notes (Signed)
Careteam: Patient Care Team: Mahlon Gammon, MD as PCP - General (Internal Medicine) Tonny Bollman, MD as PCP - Cardiology (Cardiology) Bernette Redbird, MD as Consulting Physician (Gastroenterology) Antony Contras, MD as Consulting Physician (Ophthalmology) [place], Chesney Klimaszewski, MD as Consulting Physician (Dermatology) Mast, Man X, NP as Nurse Practitioner (Internal Medicine)  Seen by: Hazle Nordmann, AGNP-C  PLACE OF SERVICE:  [facility]  Advanced Directive information    Allergies  Allergen Reactions   Atorvastatin     Muscle weakness    Chief Complaint  Patient presents with   Acute Visit    Patient complains of breast pain.      HPI: Patient is a 82 y.o. female seen today for acute visit due to breast pain.   No family h/o breast cancer. She reports left breast pain x 1 week. Pain described as sharp/generalized, with rest and movement, rated 3/10. Denies palpating lump, skin changes or nipple discharge. She does work out a few times weekly. Work outs include upper body weight lifting. Does not stretch prior to weight lifting. She also sleeps on left side. She has not tried any OTC pain relievers. Today, pain has subsided. Screening mammogram ordered.   Review of Systems:  Review of Systems  Constitutional:  Negative for fever.  Respiratory:  Negative for cough, shortness of breath and wheezing.   Cardiovascular:  Negative for chest pain and leg swelling.       Left breast pain  Musculoskeletal:  Negative for falls and joint pain.  Psychiatric/Behavioral:  Negative for depression. The patient is not nervous/anxious.     Past Medical History:  Diagnosis Date   Anemia 12/2015   Bronchitis, chronic (HCC)    in her 59s   Cataract    Chronic kidney disease    in 65s had nephritis   Closed fracture of right distal humerus    right   Coronary artery disease    Hand, foot and mouth disease (HFMD)    Hearing loss 05/03/2016   High cholesterol    Hypothyroidism     Osteopenia    Pericarditis 01/1999   Pneumothorax 01/1999   Thyroid disorder    Vitamin B 12 deficiency    Past Surgical History:  Procedure Laterality Date   birthmark      removed,left leg   CARDIAC CATHETERIZATION     CATARACT EXTRACTION  2017   CORONARY STENT INTERVENTION N/A 09/01/2020   Procedure: CORONARY STENT INTERVENTION;  Surgeon: Lyn Records, MD;  Location: [facility] INVAS[facility];  Service: Cardiovascular;  Laterality: N/A;   LEFT HEART CATH AND CORONARY ANGIOGRAPHY N/A 09/01/2020   Procedure: LEFT HEART CATH AND CORONARY ANGIOGRAPHY;  Surgeon: Lyn Records, MD;  Location: [facility];  Service: Cardiovascular;  Laterality: N/A;   ORIF HUMERUS FRACTURE Right 04/10/2019   Procedure: RIGHT OPEN REDUCTION INTERNAL FIXATION (ORIF) DISTAL HUMERUS FRACTURE WITH EXTENSION;  Surgeon: Bjorn Pippin, MD;  Location: [facility];  Service: Orthopedics;  Laterality: Right;   TONSILLECTOMY AND ADENOIDECTOMY  1952?   Social History:   reports that she has never smoked. She has never used smokeless tobacco. She reports current alcohol use of about 3.0 standard drinks of alcohol per week. She reports that she does not use drugs.  Family History  Problem Relation Age of Onset   Diabetes Mother    Asthma Mother    Ataxia Father    Stroke Father    Cancer - Cervical Sister  Thyroid disease Sister    Diabetes Other    Diabetes Maternal Grandmother    Arthritis Paternal Grandmother    Colon cancer Neg Hx     Medications: Patient's Medications  New Prescriptions   No medications on file  Previous Medications   ACETAMINOPHEN (TYLENOL) 650 MG CR TABLET    Take 1,300 mg by mouth every 8 (eight) hours as needed for pain.   CALCIUM CARB-CHOLECALCIFEROL (CALCIUM 600+D) 600-800 MG-UNIT TABS    Take 2 tablets by mouth daily.   CHOLECALCIFEROL (VITAMIN D) 1000 UNITS TABLET    Take 2,000 Units by mouth daily.    CLOBETASOL OINTMENT (TEMOVATE) 0.05 %    Apply 1  Application topically as needed.   CLOPIDOGREL (PLAVIX) 75 MG TABLET    TAKE 1 TABLET BY MOUTH DAILY   DENOSUMAB (PROLIA) 60 MG/ML SOSY INJECTION    Inject 60 mg into the skin every 6 (six) months.   FERROUS SULFATE 325 (65 FE) MG TABLET    Take 325 mg by mouth daily with breakfast.   GABAPENTIN (NEURONTIN) 100 MG CAPSULE    Take 1 capsule (100 mg total) by mouth 3 (three) times daily.   LEVOTHYROXINE (SYNTHROID) 112 MCG TABLET    Take 1 tablet (112 mcg total) by mouth daily.   LORATADINE (CLARITIN) 10 MG TABLET    Take 10 mg by mouth as needed.   NITROGLYCERIN (NITROSTAT) 0.4 MG SL TABLET    TAKE 1 TABLET UNDER THE TONGUE EVERY 5 MINUTES AS NEEDED FOR CHEST PAIN   ROSUVASTATIN (CRESTOR) 20 MG TABLET    TAKE 1 TABLET BY MOUTH DAILY   STUDY - [name] - OLPASIRAN (AMG 890) 142 MG/ML OR PLACEBO SQ INJECTION (PI-HILTY)    Inject 142 mg into the skin once. For investigational use only - Inject 1 mL (1 prefilled syringe) subcutaneously into appropriate injection site per protocol. (Approved injection site(s): upper arm, upper thigh, abdomen). Please contact [facility] if any questions.   TRIAMCINOLONE CREAM (KENALOG) 0.1 %    Apply 1 Application topically 2 (two) times daily as needed.   VITAMIN C (ASCORBIC ACID) 500 MG TABLET    Take 500 mg by mouth daily.  Modified Medications   No medications on file  Discontinued Medications   No medications on file    Physical Exam:  There were no vitals filed for this visit. There is no height or weight on file to calculate BMI. Wt Readings from Last 3 Encounters:  08/03/22 158 lb 9.6 oz (71.9 kg)  07/11/22 157 lb 6.4 oz (71.4 kg)  06/01/22 159 lb 11.2 oz (72.4 kg)    Physical Exam Vitals reviewed.  Constitutional:      General: She is not in acute distress. HENT:     Head: Normocephalic.  Eyes:     General:        Right eye: No discharge.        Left eye: No discharge.  Cardiovascular:     Rate and Rhythm: Normal rate and  regular rhythm.     Pulses: Normal pulses.     Heart sounds: Normal heart sounds.  Pulmonary:     Effort: Pulmonary effort is normal. No respiratory distress.     Breath sounds: Normal breath sounds. No wheezing.  Chest:     Chest wall: No mass, swelling or tenderness.  Breasts:    Breasts are symmetrical.     Left: Tenderness present. No swelling, inverted nipple, mass, nipple discharge or skin  change.     Comments: Left breast normal skin, no mass palpated, no deformity, tenderness to left chest and under left breast extending to axilla, left nipple normal.  Abdominal:     General: Bowel sounds are normal.     Palpations: Abdomen is soft.  Musculoskeletal:     Cervical back: Neck supple.  Lymphadenopathy:     Upper Body:     Left upper body: No axillary adenopathy.  Skin:    General: Skin is warm.     Capillary Refill: Capillary refill takes less than 2 seconds.  Neurological:     General: No focal deficit present.     Mental Status: She is alert and oriented to person, place, and time.  Psychiatric:        Mood and Affect: Mood normal.        Behavior: Behavior normal.     Labs reviewed: Basic Metabolic Panel: Recent Labs    09/23/21 0810 12/30/21 0800 05/30/22 0811  NA 138 139 140  K 4.2 4.0 4.6  CL 105 105 107  CO2 22 27 24   GLUCOSE 79 108* 91  BUN 18 20 16   CREATININE 0.61 0.64 0.58*  CALCIUM 9.0 8.6 8.9  TSH 1.81  --  1.09   Liver Function Tests: Recent Labs    09/23/21 0810 12/30/21 0800 05/30/22 0811  AST 16 16 15   ALT 16 14 15   BILITOT 0.5 0.5 0.5  PROT 6.1 6.0* 6.4   No results for input(s): "LIPASE", "AMYLASE" in the last 8760 hours. No results for input(s): "AMMONIA" in the last 8760 hours. CBC: Recent Labs    09/23/21 0810 12/30/21 0758 05/30/22 0811  WBC 7.5 7.9 7.4  NEUTROABS 4,650 4,645 4,433  HGB 13.3 13.4 13.6  HCT 38.7 38.3 39.2  MCV 94.6 95.0 94.5  PLT 245 253 257   Lipid Panel: Recent Labs    09/23/21 0810  05/30/22 0811  CHOL 139 150  HDL 49* 58  LDLCALC 72 75  TRIG 101 90  CHOLHDL 2.8 2.6   TSH: Recent Labs    09/23/21 0810 05/30/22 0811  TSH 1.81 1.09   A1C: Lab Results  Component Value Date   HGBA1C 5.9 (H) 04/11/2022     Assessment/Plan 1. Breast pain, left - left breast pain x 1 week> resolved today - mild tenderness to left upper chest/ under left breast extending to axilla, otherwise normal breast exam - ? Muscular involvement> admits to weight lifting/ sleeping on left side - no family h/o breast cancer or abnormal mammogram - schedule screening mammogram    Next appt: Visit date not found  Vasilisa Vore Scherry Ran  [facility] [phone]

## 2022-08-16 VITALS — BP 119/64 | HR 65 | Temp 97.0°F

## 2022-08-16 DIAGNOSIS — Z006 Encounter for examination for normal comparison and control in clinical research program: Secondary | ICD-10-CM

## 2022-08-16 MED ORDER — STUDY - OCEAN(A) - OLPASIRAN (AMG 890) 142 MG/ML OR PLACEBO SQ INJECTION (PI-HILTY)
142.0000 mg | PREFILLED_SYRINGE | Freq: Once | SUBCUTANEOUS | Status: AC
  Administered 2022-08-16: 142 mg via SUBCUTANEOUS
  Filled 2022-08-16: qty 1

## 2022-08-16 NOTE — Research (Signed)
[name] week 60  Patient seen in clinic today for week  60 visit of [name] trial. Patient denies any adverse events since last visit. Reviewed medications with patient and no changes noted at this visit. All procedures completed per protocol and patient tolerated well without complaints. IP injection was given at 11:02 in the L arm without any complications noted.  Next visit Week 72 scheduled for 11/16/22.  Reviewed lipid blinding with the patient and he/she understands.   [name] IP ADMINISTRATION Subject YQ#[id number] IP Box [id number] IP MWU#:[id number] Time Given:11:02 Administration site:L arm   Current Outpatient Medications:    acetaminophen (TYLENOL) 650 MG CR tablet, Take 1,300 mg by mouth every 8 (eight) hours as needed for pain., Disp: , Rfl:    Calcium Carb-Cholecalciferol (CALCIUM 600+D) 600-800 MG-UNIT TABS, Take 2 tablets by mouth daily., Disp: , Rfl:    cholecalciferol (VITAMIN D) 1000 UNITS tablet, Take 2,000 Units by mouth daily. , Disp: , Rfl:    clobetasol ointment (TEMOVATE) 0.05 %, Apply 1 Application topically as needed., Disp: , Rfl:    clopidogrel (PLAVIX) 75 MG tablet, TAKE 1 TABLET BY MOUTH DAILY, Disp: 90 tablet, Rfl: 2   denosumab (PROLIA) 60 MG/ML SOSY injection, Inject 60 mg into the skin every 6 (six) months., Disp: , Rfl:    ferrous sulfate 325 (65 FE) MG tablet, Take 325 mg by mouth daily with breakfast., Disp: , Rfl:    gabapentin (NEURONTIN) 100 MG capsule, Take 100 mg by mouth daily., Disp: , Rfl:    levothyroxine (SYNTHROID) 112 MCG tablet, Take 1 tablet (112 mcg total) by mouth daily., Disp: 90 tablet, Rfl: 3   loratadine (CLARITIN) 10 MG tablet, Take 10 mg by mouth as needed., Disp: , Rfl:    nitroGLYCERIN (NITROSTAT) 0.4 MG SL tablet, TAKE 1 TABLET UNDER THE TONGUE EVERY 5 MINUTES AS NEEDED FOR CHEST PAIN, Disp: 25 tablet, Rfl: 6   rosuvastatin (CRESTOR) 20 MG tablet, TAKE 1 TABLET BY MOUTH DAILY, Disp: 30 tablet, Rfl: 6   Study - [name] -  olpasiran (AMG 890) 142 mg/mL or placebo SQ injection (PI-Hilty), Inject 142 mg into the skin once. For investigational use only - Inject 1 mL (1 prefilled syringe) subcutaneously into appropriate injection site per protocol. (Approved injection site(s): upper arm, upper thigh, abdomen). Please contact [facility] if any questions., Disp: , Rfl:    triamcinolone cream (KENALOG) 0.1 %, Apply 1 Application topically 2 (two) times daily as needed., Disp: , Rfl:    vitamin C (ASCORBIC ACID) 500 MG tablet, Take 500 mg by mouth daily., Disp: , Rfl:   Current Facility-Administered Medications:    Study - [name] - olpasiran (AMG 890) 142 mg/mL or placebo SQ injection (PI-Hilty), 142 mg, Subcutaneous, Once, Hilty, Lisette Abu, MD

## 2022-08-19 ENCOUNTER — Ambulatory Visit: Payer: PRIVATE HEALTH INSURANCE

## 2022-08-29 ENCOUNTER — Ambulatory Visit (HOSPITAL_COMMUNITY): Payer: Medicare Other | Attending: Cardiovascular Disease

## 2022-08-29 DIAGNOSIS — I2510 Atherosclerotic heart disease of native coronary artery without angina pectoris: Secondary | ICD-10-CM | POA: Diagnosis not present

## 2022-08-29 DIAGNOSIS — R0609 Other forms of dyspnea: Secondary | ICD-10-CM | POA: Insufficient documentation

## 2022-08-29 DIAGNOSIS — E782 Mixed hyperlipidemia: Secondary | ICD-10-CM | POA: Diagnosis not present

## 2022-08-29 LAB — ECHOCARDIOGRAM COMPLETE
Area-P 1/2: 2.69 cm2
S' Lateral: 2.3 cm

## 2022-08-31 ENCOUNTER — Encounter: Payer: Self-pay | Admitting: Internal Medicine

## 2022-09-06 ENCOUNTER — Ambulatory Visit
Admission: RE | Admit: 2022-09-06 | Discharge: 2022-09-06 | Disposition: A | Discharge status: Home / Self Care | Payer: Medicare Other | Source: Ambulatory Visit | Attending: Internal Medicine | Admitting: Internal Medicine

## 2022-09-06 DIAGNOSIS — Z1231 Encounter for screening mammogram for malignant neoplasm of breast: Secondary | ICD-10-CM | POA: Diagnosis not present

## 2022-09-09 ENCOUNTER — Other Ambulatory Visit: Payer: Self-pay | Admitting: Internal Medicine

## 2022-09-09 DIAGNOSIS — R928 Other abnormal and inconclusive findings on diagnostic imaging of breast: Secondary | ICD-10-CM

## 2022-09-12 ENCOUNTER — Ambulatory Visit: Admission: RE | Admit: 2022-09-12 | Payer: Medicare Other | Source: Ambulatory Visit

## 2022-09-12 ENCOUNTER — Ambulatory Visit
Admission: RE | Admit: 2022-09-12 | Discharge: 2022-09-12 | Disposition: A | Discharge status: Home / Self Care | Payer: Medicare Other | Source: Ambulatory Visit | Attending: Internal Medicine | Admitting: Internal Medicine

## 2022-09-12 ENCOUNTER — Other Ambulatory Visit: Payer: Self-pay | Admitting: Internal Medicine

## 2022-09-12 ENCOUNTER — Other Ambulatory Visit: Payer: Medicare Other

## 2022-09-12 DIAGNOSIS — N6320 Unspecified lump in the left breast, unspecified quadrant: Secondary | ICD-10-CM

## 2022-09-12 DIAGNOSIS — N6325 Unspecified lump in the left breast, overlapping quadrants: Secondary | ICD-10-CM | POA: Diagnosis not present

## 2022-09-12 DIAGNOSIS — R928 Other abnormal and inconclusive findings on diagnostic imaging of breast: Secondary | ICD-10-CM

## 2022-09-12 DIAGNOSIS — N6012 Diffuse cystic mastopathy of left breast: Secondary | ICD-10-CM | POA: Diagnosis not present

## 2022-09-12 DIAGNOSIS — C50919 Malignant neoplasm of unspecified site of unspecified female breast: Secondary | ICD-10-CM | POA: Insufficient documentation

## 2022-09-13 ENCOUNTER — Ambulatory Visit: Payer: PRIVATE HEALTH INSURANCE | Admitting: Physician Assistant

## 2022-09-13 ENCOUNTER — Ambulatory Visit
Admission: RE | Admit: 2022-09-13 | Discharge: 2022-09-13 | Disposition: A | Discharge status: Home / Self Care | Payer: Medicare Other | Source: Ambulatory Visit | Attending: Internal Medicine | Admitting: Internal Medicine

## 2022-09-13 DIAGNOSIS — N6325 Unspecified lump in the left breast, overlapping quadrants: Secondary | ICD-10-CM | POA: Diagnosis not present

## 2022-09-13 DIAGNOSIS — C50812 Malignant neoplasm of overlapping sites of left female breast: Secondary | ICD-10-CM | POA: Diagnosis not present

## 2022-09-13 DIAGNOSIS — N6320 Unspecified lump in the left breast, unspecified quadrant: Secondary | ICD-10-CM

## 2022-09-13 HISTORY — PX: BREAST BIOPSY: SHX20

## 2022-09-15 ENCOUNTER — Telehealth: Payer: Self-pay | Admitting: Hematology and Oncology

## 2022-09-15 NOTE — Telephone Encounter (Signed)
Contacted patient to scheduled appointments. Patient is aware of appointments that are scheduled.   

## 2022-09-19 NOTE — Progress Notes (Signed)
Patient Care Team: Mahlon Gammon, MD as PCP - General (Internal Medicine) Tonny Bollman, MD as PCP - Cardiology (Cardiology) Bernette Redbird, MD as Consulting Physician (Gastroenterology) Antony Contras, MD as Consulting Physician (Ophthalmology) [place], Amy, MD as Consulting Physician (Dermatology) Mast, Man X, NP as Nurse Practitioner (Internal Medicine)  DIAGNOSIS: No diagnosis found.  SUMMARY OF ONCOLOGIC HISTORY: Oncology History   No history exists.    CHIEF COMPLIANT:   INTERVAL HISTORY: Jessica [name] is a   ALLERGIES:  is allergic to atorvastatin.  MEDICATIONS:  Current Outpatient Medications  Medication Sig Dispense Refill   acetaminophen (TYLENOL) 650 MG CR tablet Take 1,300 mg by mouth every 8 (eight) hours as needed for pain.     Calcium Carb-Cholecalciferol (CALCIUM 600+D) 600-800 MG-UNIT TABS Take 2 tablets by mouth daily.     cholecalciferol (VITAMIN D) 1000 UNITS tablet Take 2,000 Units by mouth daily.      clobetasol ointment (TEMOVATE) 0.05 % Apply 1 Application topically as needed.     clopidogrel (PLAVIX) 75 MG tablet TAKE 1 TABLET BY MOUTH DAILY 90 tablet 2   denosumab (PROLIA) 60 MG/ML SOSY injection Inject 60 mg into the skin every 6 (six) months.     ferrous sulfate 325 (65 FE) MG tablet Take 325 mg by mouth daily with breakfast.     gabapentin (NEURONTIN) 100 MG capsule Take 100 mg by mouth daily.     levothyroxine (SYNTHROID) 112 MCG tablet Take 1 tablet (112 mcg total) by mouth daily. 90 tablet 3   loratadine (CLARITIN) 10 MG tablet Take 10 mg by mouth as needed.     nitroGLYCERIN (NITROSTAT) 0.4 MG SL tablet TAKE 1 TABLET UNDER THE TONGUE EVERY 5 MINUTES AS NEEDED FOR CHEST PAIN 25 tablet 6   rosuvastatin (CRESTOR) 20 MG tablet TAKE 1 TABLET BY MOUTH DAILY 30 tablet 6   Study - [name] - olpasiran (AMG 890) 142 mg/mL or placebo SQ injection (PI-Hilty) Inject 142 mg into the skin once. For investigational use only - Inject 1 mL (1 prefilled  syringe) subcutaneously into appropriate injection site per protocol. (Approved injection site(s): upper arm, upper thigh, abdomen). Please contact [facility] if any questions.     triamcinolone cream (KENALOG) 0.1 % Apply 1 Application topically 2 (two) times daily as needed.     vitamin C (ASCORBIC ACID) 500 MG tablet Take 500 mg by mouth daily.     No current facility-administered medications for this visit.    PHYSICAL EXAMINATION: ECOG PERFORMANCE STATUS: {CHL ONC ECOG PS:[id number]}  There were no vitals filed for this visit. There were no vitals filed for this visit.  BREAST:*** No palpable masses or nodules in either right or left breasts. No palpable axillary supraclavicular or infraclavicular adenopathy no breast tenderness or nipple discharge. (exam performed in the presence of a chaperone)  LABORATORY DATA:  I have reviewed the data as listed    Latest Ref Rng & Units 05/30/2022    8:11 AM 12/30/2021    8:00 AM 09/23/2021    8:10 AM  CMP  Glucose 65 - 99 mg/dL 91  161  79   BUN 7 - 25 mg/dL 16  20  18    Creatinine 0.60 - 0.95 mg/dL 0.96  0.45  4.09   Sodium 135 - 146 mmol/L 140  139  138   Potassium 3.5 - 5.3 mmol/L 4.6  4.0  4.2   Chloride 98 - 110 mmol/L 107  105  105  CO2 20 - 32 mmol/L 24  27  22    Calcium 8.6 - 10.4 mg/dL 8.9  8.6  9.0   Total Protein 6.1 - 8.1 g/dL 6.4  6.0  6.1   Total Bilirubin 0.2 - 1.2 mg/dL 0.5  0.5  0.5   AST 10 - 35 U/L 15  16  16    ALT 6 - 29 U/L 15  14  16      Lab Results  Component Value Date   WBC 7.4 05/30/2022   HGB 13.6 05/30/2022   HCT 39.2 05/30/2022   MCV 94.5 05/30/2022   PLT 257 05/30/2022   NEUTROABS 4,433 05/30/2022    ASSESSMENT & PLAN:  No problem-specific Assessment & Plan notes found for this encounter.    No orders of the defined types were placed in this encounter.  The patient has a good understanding of the overall plan. she agrees with it. she will call with any problems that may  develop before the next visit here. Total time spent: 30 mins including face to face time and time spent for planning, charting and co-ordination of care   Sherlyn Lick, CMA 09/19/22    I Janan Ridge am acting as a Neurosurgeon for [employer]  ***

## 2022-09-21 ENCOUNTER — Inpatient Hospital Stay: Payer: Medicare Other | Attending: Hematology and Oncology | Admitting: Hematology and Oncology

## 2022-09-21 ENCOUNTER — Inpatient Hospital Stay: Payer: Medicare Other

## 2022-09-21 VITALS — BP 139/72 | HR 65 | Temp 97.2°F | Resp 18 | Ht 63.0 in | Wt 157.2 lb

## 2022-09-21 DIAGNOSIS — C50412 Malignant neoplasm of upper-outer quadrant of left female breast: Secondary | ICD-10-CM | POA: Insufficient documentation

## 2022-09-21 DIAGNOSIS — Z171 Estrogen receptor negative status [ER-]: Secondary | ICD-10-CM | POA: Diagnosis not present

## 2022-09-21 NOTE — Assessment & Plan Note (Addendum)
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 After reviewing the pathology in detail, we proceeded to discuss the different treatment options between surgery, radiation, chemotherapy, antiestrogen therapies.  Recommendation: Breast MRI Breast conserving surgery with sentinel lymph node biopsy Repeat breast prognostic panel on the final path and confirmed that this was truly triple negative if it is confirmed then I recommend adjuvant CMF x 6 Adjuvant radiation therapy  Patient has appointment to see Dr. Luisa Hart next week. Return to clinic after surgery to discuss the final pathology report  Patient agreed to participate in the exact sciences blood draw study

## 2022-09-21 NOTE — Research (Signed)
[employer] 2021-05 - Specimen Collection Study to Evaluate Biomarkers in Subjects with Cancer    Met with the patient to discuss the exact study. The patient confirmed that they would be interested in participating. Will call the patient on Monday, 09/26/2022, to set up an appointment to compete study procedures.  Eero Dini, [facility] 09/21/2022 1:05 PM

## 2022-09-22 ENCOUNTER — Encounter: Payer: Self-pay | Admitting: *Deleted

## 2022-09-23 ENCOUNTER — Encounter: Payer: Self-pay | Admitting: Hematology and Oncology

## 2022-09-23 ENCOUNTER — Encounter: Payer: Self-pay | Admitting: *Deleted

## 2022-09-23 NOTE — Telephone Encounter (Signed)
Contacted patient in regards to [employer]. LVM to inform her that message will be given to MD. He is out of office this afternoon, so will receive it Monday AM when he is back in office.

## 2022-09-24 ENCOUNTER — Ambulatory Visit
Admission: RE | Admit: 2022-09-24 | Discharge: 2022-09-24 | Disposition: A | Discharge status: Home / Self Care | Payer: Medicare Other | Source: Ambulatory Visit | Attending: Hematology and Oncology | Admitting: Hematology and Oncology

## 2022-09-24 DIAGNOSIS — C50912 Malignant neoplasm of unspecified site of left female breast: Secondary | ICD-10-CM | POA: Diagnosis not present

## 2022-09-24 DIAGNOSIS — Z171 Estrogen receptor negative status [ER-]: Secondary | ICD-10-CM

## 2022-09-24 DIAGNOSIS — R928 Other abnormal and inconclusive findings on diagnostic imaging of breast: Secondary | ICD-10-CM | POA: Diagnosis not present

## 2022-09-24 DIAGNOSIS — N6320 Unspecified lump in the left breast, unspecified quadrant: Secondary | ICD-10-CM | POA: Diagnosis not present

## 2022-09-24 IMAGING — CR DG CHEST 2V
2 series · 2 of 2 positions shown · non-contrast
Comparison: 02/17/2018

CLINICAL DATA: Chest pain

EXAM:
CHEST - 2 VIEW

[chest pa]
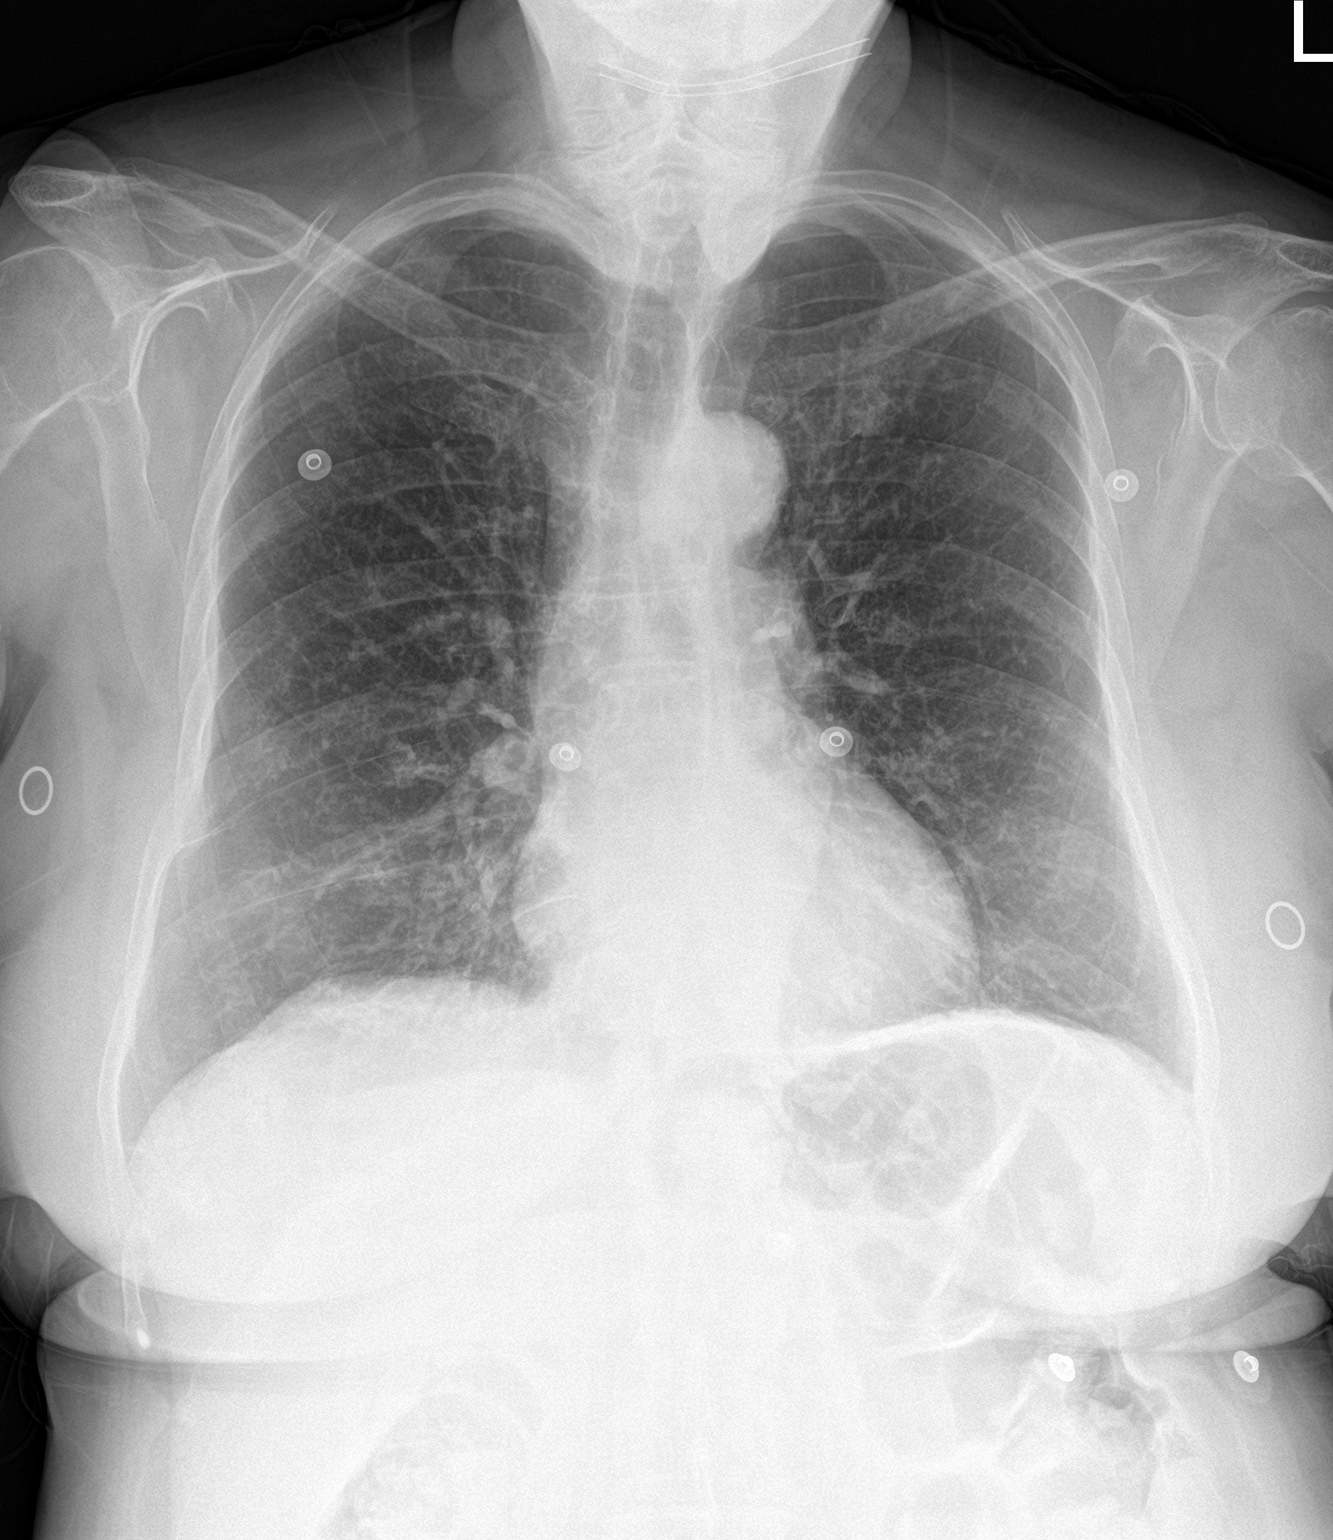

[chest lat]
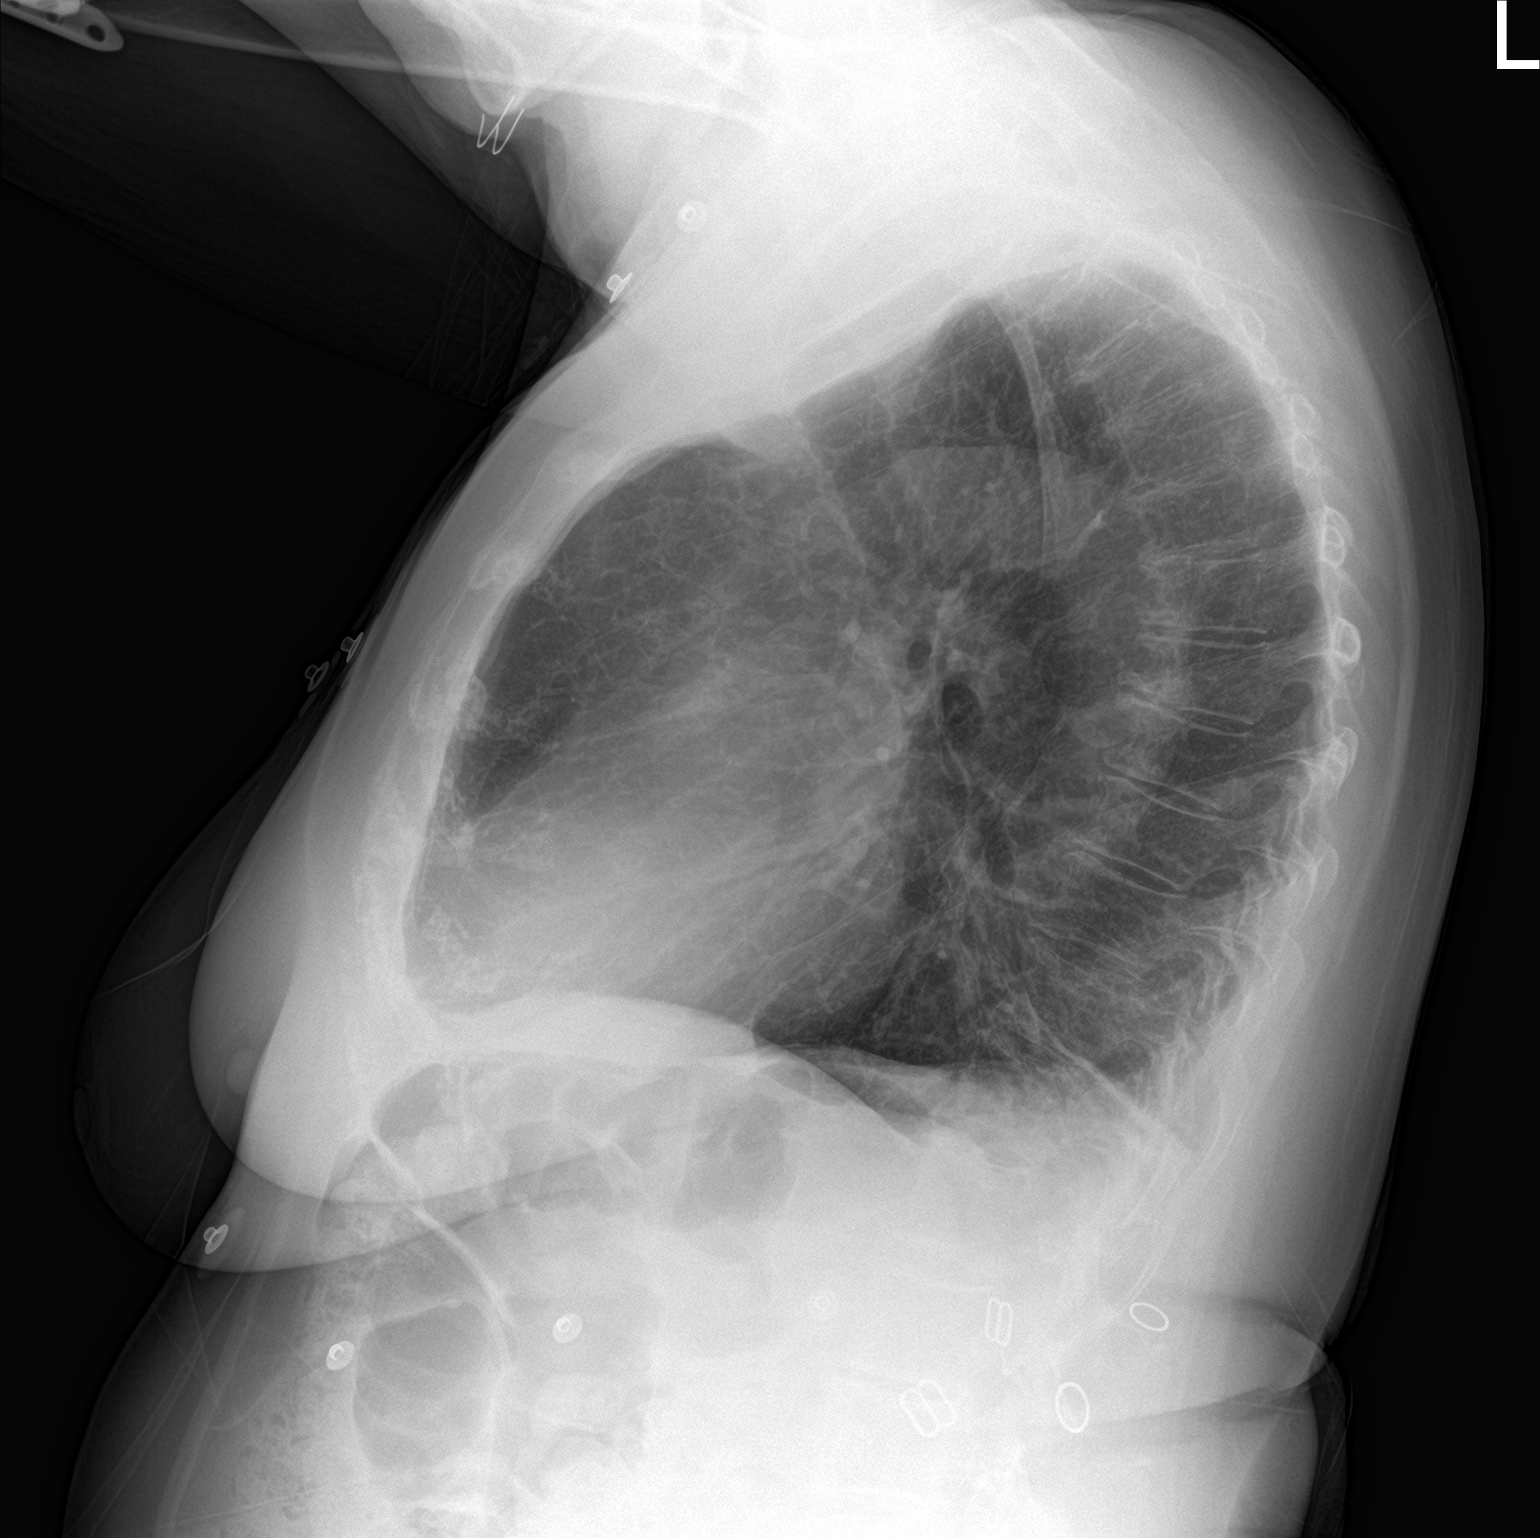

[2 of 2 positions shown; findings below may reference images not displayed]

FINDINGS: Increased markings in the medial right lung base could reflect
pneumonia. This is noted posteriorly on the lateral view. Left lung
clear. Heart is normal size. No effusions or acute bony abnormality.
IMPRESSION: Right medial posterior basilar opacity concerning for pneumonia.

## 2022-09-24 MED ORDER — GADOPICLENOL 0.5 MMOL/ML IV SOLN
7.0000 mL | Freq: Once | INTRAVENOUS | Status: AC | PRN
  Administered 2022-09-24: 7 mL via INTRAVENOUS

## 2022-09-27 ENCOUNTER — Encounter: Payer: Self-pay | Admitting: *Deleted

## 2022-09-27 ENCOUNTER — Telehealth: Payer: Self-pay

## 2022-09-27 DIAGNOSIS — C50412 Malignant neoplasm of upper-outer quadrant of left female breast: Secondary | ICD-10-CM

## 2022-09-27 NOTE — Telephone Encounter (Signed)
[employer] 2021-05 - Specimen Collection Study to Evaluate Biomarkers in Subjects with Cancer   Called the patient about participating in the exact study. The patient agreed to participation and will be coming tomorrow at 1pm to complete study procedures.   Felecia Jan, [facility] 09/27/2022 4:51 PM

## 2022-09-28 ENCOUNTER — Other Ambulatory Visit: Payer: Self-pay

## 2022-09-28 ENCOUNTER — Other Ambulatory Visit: Payer: Self-pay | Admitting: *Deleted

## 2022-09-28 ENCOUNTER — Inpatient Hospital Stay: Payer: Medicare Other

## 2022-09-28 ENCOUNTER — Inpatient Hospital Stay: Payer: Medicare Other | Admitting: Hematology and Oncology

## 2022-09-28 VITALS — BP 105/58 | HR 71 | Temp 97.8°F | Resp 18 | Ht 63.0 in | Wt 154.1 lb

## 2022-09-28 DIAGNOSIS — Z171 Estrogen receptor negative status [ER-]: Secondary | ICD-10-CM | POA: Diagnosis not present

## 2022-09-28 DIAGNOSIS — C50412 Malignant neoplasm of upper-outer quadrant of left female breast: Secondary | ICD-10-CM | POA: Diagnosis not present

## 2022-09-28 LAB — RESEARCH LABS

## 2022-09-28 NOTE — Assessment & Plan Note (Signed)
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  Indeterminate 1.2 cm right breast mass  Recommendation: Right breast biopsy Left mastectomy without sentinel lymph node biopsy Consideration for adjuvant CMF if it is truly triple negative Adjuvant radiation therapy   Return to clinic after surgery to discuss the final pathology report

## 2022-09-28 NOTE — Research (Signed)
[employer] 2021-05 - Specimen Collection Study to Evaluate Biomarkers in Subjects with Cancer   This Nurse has reviewed this patient's inclusion and exclusion criteria as a second review and confirms Jessica [name] is eligible for study participation.  Patient may continue with enrollment.  [name] [name], RN, BSN, [place] [facility] Clinical Research Nurse [facility] Direct Dial [phone]  Pager [phone] 09/28/2022 1:31 PM

## 2022-09-28 NOTE — Research (Signed)
[employer] 2021-05 - Specimen Collection Study to Evaluate Biomarkers in Subjects with Cancer   Patient Jessica [name] was identified by Dr. Pamelia Hoit as a potential candidate for the above listed study.  This Clinical Research Coordinator met with Jessica [name], Jessica [name] on 09/28/22 in a manner and location that ensures patient privacy to discuss participation in the above listed research study.  Patient is Unaccompanied.  Patient was previously provided with informed consent documents.  Patient confirmed they have read the informed consent documents.  As outlined in the informed consent form, this Coordinator and Jessica [name] discussed the purpose of the research study, the investigational nature of the study, study procedures and requirements for study participation, potential risks and benefits of study participation, as well as alternatives to participation.  This study is not blinded or double-blinded. The patient understands participation is voluntary and they may withdraw from study participation at any time.  This study does not involve randomization.  This study does not involve an investigational drug or device. This study does not involve a placebo. Patient understands enrollment is pending full eligibility review.   Confidentiality and how the patient's information will be used as part of study participation were discussed.  Patient was informed there is not reimbursement provided for their time and effort spent on trial participation.  The patient is encouraged to discuss research study participation with their insurance provider to determine what costs they may incur as part of study participation, including research related injury.    All questions were answered to patient's satisfaction.  The informed consent with embedded HIPAA language was reviewed page by page.  The patient's mental and emotional status is appropriate to provide informed consent, and the patient  verbalizes an understanding of study participation.  Patient has agreed to participate in the above listed research study and has voluntarily signed the informed consent version [employer] approved 20 Mar 2020 with embedded HIPAA language, version 3  on 09/28/22 at 1:30PM.  The patient was provided with a copy of the signed informed consent form with embedded HIPAA language for their reference.  No study specific procedures were obtained prior to the signing of the informed consent document.  Approximately 30 minutes were spent with the patient reviewing the informed consent documents.  Patient was not requested to complete a Release of Information form.   This Coordinator has reviewed this patient's inclusion and exclusion criteria and confirmed Jessica [name] is eligible for study participation.  Patient will continue with enrollment.  Menopausal status (women only): Jessica [name] is post-menopausal   Eligibility confirmed by treating investigator, who also agrees that patient should proceed with enrollment.  Medical History:  High Blood Pressure  No Coronary Artery Disease No Lupus    No Rheumatoid Arthritis  No Diabetes   No      If yes, which type?      N/a Lynch Syndrome  No  Is the patient currently taking a magnesium supplement?   No If yes, dose and frequency? N/a Indication? N/a Start date? N/a  Does the patient have a personal history of cancer (greater than 5 years ago)?  No If yes, Cancer type and date of diagnosis?   N/a  Has this previous diagnosis been treated? N/a      If so, treatment type? N/a   Start and end dates of last treatment cycle? N/a  Does the patient have a family history of cancer in 1st or 2nd degree relatives? Yes  If yes, Relationship(s) and Cancer type(s)? Sister- cervical   Does the patient have history of alcohol consumption? No   If yes, current or former? N/a If former, year stopped? N/a Number of years? N/a Drinks per week?  N/a  Does the patient have history of cigarette, cigar, pipe, or chewing tobacco use?  No  If yes, current for former? N/a If yes, type (Cigarette, cigar, pipe, and/or chewing tobacco)? N/a   If former, year stopped? N/a Number of years? N/a Packs/number/containers per day? N/a  After the study consent procedures, the patient was escorted to the lab by clinical research specialist Jessica [name] to complete their sample collection for the study. The patient was thanked for their participation and were given their gift card upon completion. Jessica [name], [facility] 09/28/2022 1:48 PM

## 2022-09-28 NOTE — Progress Notes (Signed)
Patient Care Team: Mahlon Gammon, MD as PCP - General (Internal Medicine) Tonny Bollman, MD as PCP - Cardiology (Cardiology) Bernette Redbird, MD as Consulting Physician (Gastroenterology) Antony Contras, MD as Consulting Physician (Ophthalmology) [place], Amy, MD as Consulting Physician (Dermatology) Mast, Man X, NP as Nurse Practitioner (Internal Medicine) Pershing Proud, RN as Oncology Nurse Navigator Donnelly Angelica, RN as Oncology Nurse Navigator Serena Croissant, MD as Consulting Physician (Hematology and Oncology)  DIAGNOSIS:  Encounter Diagnosis  Name Primary?   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 She presents to the clinic for a follow-up after undergoing recent breast MRI.  Breast MRI showed extensive changes in the left breast measuring 8 cm.  In the right breast there was a 1.2 cm lesion.  She wanted to sit down with me and discuss her treatment plan.  She felt that she does not want undergo x-ray surgery since the breast MRI and the ultrasound were  both negative for any lymph node involvement.     ALLERGIES:  is allergic to atorvastatin.  MEDICATIONS:  Current Outpatient Medications  Medication Sig Dispense Refill   acetaminophen (TYLENOL) 650 MG CR tablet Take 1,300 mg by mouth every 8 (eight) hours as needed for pain.     Calcium Carb-Cholecalciferol (CALCIUM 600+D) 600-800 MG-UNIT TABS Take 2 tablets by mouth daily.     cholecalciferol (VITAMIN D) 1000 UNITS tablet Take 2,000 Units by mouth daily.      clobetasol ointment (TEMOVATE) 0.05 % Apply 1 Application topically as needed.     clopidogrel (PLAVIX) 75 MG tablet TAKE 1 TABLET BY MOUTH DAILY 90 tablet 2   denosumab (PROLIA) 60 MG/ML SOSY injection Inject 60 mg into the skin every 6 (six) months.     ferrous sulfate 325 (65 FE) MG tablet Take 325 mg by mouth daily with breakfast.     gabapentin (NEURONTIN) 100 MG capsule Take 100 mg by mouth daily.     levothyroxine (SYNTHROID) 112 MCG tablet Take 1 tablet (112 mcg total) by mouth daily. 90 tablet 3   loratadine (CLARITIN) 10 MG tablet Take 10 mg by mouth as needed.     nitroGLYCERIN (NITROSTAT) 0.4 MG SL tablet TAKE 1 TABLET UNDER THE TONGUE EVERY 5 MINUTES AS NEEDED FOR CHEST PAIN 25 tablet 6   rosuvastatin (CRESTOR) 20 MG tablet TAKE 1 TABLET BY MOUTH DAILY 30 tablet 6   Study - [name] - olpasiran (AMG 890) 142 mg/mL or placebo SQ injection (PI-Hilty) Inject 142 mg into the skin once. For investigational use only - Inject 1 mL (1 prefilled syringe) subcutaneously into appropriate injection  site per protocol. (Approved injection site(s): upper arm, upper thigh, abdomen). Please contact [facility] if any questions.     triamcinolone cream (KENALOG) 0.1 % Apply 1 Application topically 2 (two) times daily as needed.     vitamin C (ASCORBIC ACID) 500 MG tablet Take 500 mg by mouth daily.     No current facility-administered medications for this visit.    PHYSICAL EXAMINATION: ECOG PERFORMANCE STATUS: 2 -  Symptomatic, <50% confined to bed  Vitals:   09/28/22 1226  BP: (!) 105/58  Pulse: 71  Resp: 18  Temp: 97.8 F (36.6 C)  SpO2: 95%   Filed Weights   09/28/22 1226  Weight: 154 lb 1.6 oz (69.9 kg)      LABORATORY DATA:  I have reviewed the data as listed    Latest Ref Rng & Units 05/30/2022    8:11 AM 12/30/2021    8:00 AM 09/23/2021    8:10 AM  CMP  Glucose 65 - 99 mg/dL 91  098  79   BUN 7 - 25 mg/dL 16  20  18    Creatinine 0.60 - 0.95 mg/dL 1.19  1.47  8.29   Sodium 135 - 146 mmol/L 140  139  138   Potassium 3.5 - 5.3 mmol/L 4.6  4.0  4.2   Chloride 98 - 110 mmol/L 107  105  105   CO2 20 - 32 mmol/L 24  27  22    Calcium 8.6 - 10.4 mg/dL 8.9  8.6  9.0   Total Protein 6.1 - 8.1 g/dL 6.4  6.0  6.1   Total Bilirubin 0.2 - 1.2 mg/dL 0.5  0.5  0.5   AST 10 - 35 U/L 15  16  16    ALT 6 - 29 U/L 15  14  16      Lab Results  Component Value Date   WBC 7.4 05/30/2022   HGB 13.6 05/30/2022   HCT 39.2 05/30/2022   MCV 94.5 05/30/2022   PLT 257 05/30/2022   NEUTROABS 4,433 05/30/2022    ASSESSMENT & PLAN:  Malignant neoplasm of upper-outer quadrant of left breast in female, estrogen receptor negative (HCC) 09/13/2022: Screening mammogram detected spiculated left breast mass upper outer quadrant 5 o'clock position measuring 2.1 cm, axilla negative, 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  Indeterminate 1.2 cm right breast mass  Recommendation: Right breast biopsy (if benign nothing further.  If malignant she will undergo right lumpectomy) Left mastectomy without sentinel lymph node biopsy Consideration for adjuvant CMF if it is truly triple negative (will hold off on placing a port until after surgery) Adjuvant radiation therapy   Return to clinic after surgery to discuss the final pathology report      No  orders of the defined types were placed in this encounter.  The patient has a good understanding of the overall plan. she agrees with it. she will call with any problems that may develop before the next visit here. Total time spent: 30 mins including face to face time and time spent for planning, charting and co-ordination of care   Tamsen Meek, MD 09/28/22    I Janan Ridge am acting as a Neurosurgeon for [employer]  I have reviewed the above documentation for accuracy and completeness, and I agree with the above.

## 2022-09-29 ENCOUNTER — Other Ambulatory Visit: Payer: Self-pay | Admitting: *Deleted

## 2022-09-29 ENCOUNTER — Encounter: Payer: Self-pay | Admitting: Hematology and Oncology

## 2022-09-29 ENCOUNTER — Other Ambulatory Visit: Payer: Self-pay | Admitting: Hematology and Oncology

## 2022-09-29 DIAGNOSIS — R928 Other abnormal and inconclusive findings on diagnostic imaging of breast: Secondary | ICD-10-CM

## 2022-09-29 NOTE — Progress Notes (Signed)
Orders for MRI bx placed. Request sent to GI for appt to be scheduled for R MRI breast bx.

## 2022-09-30 ENCOUNTER — Ambulatory Visit
Admission: RE | Admit: 2022-09-30 | Discharge: 2022-09-30 | Disposition: A | Discharge status: Home / Self Care | Payer: Medicare Other | Source: Ambulatory Visit | Attending: Hematology and Oncology | Admitting: Hematology and Oncology

## 2022-09-30 ENCOUNTER — Ambulatory Visit: Payer: Self-pay | Admitting: Surgery

## 2022-09-30 ENCOUNTER — Telehealth: Payer: Self-pay | Admitting: *Deleted

## 2022-09-30 ENCOUNTER — Telehealth: Payer: Self-pay

## 2022-09-30 ENCOUNTER — Encounter: Payer: Self-pay | Admitting: *Deleted

## 2022-09-30 DIAGNOSIS — D241 Benign neoplasm of right breast: Secondary | ICD-10-CM | POA: Diagnosis not present

## 2022-09-30 DIAGNOSIS — R928 Other abnormal and inconclusive findings on diagnostic imaging of breast: Secondary | ICD-10-CM

## 2022-09-30 DIAGNOSIS — C50912 Malignant neoplasm of unspecified site of left female breast: Secondary | ICD-10-CM | POA: Diagnosis not present

## 2022-09-30 DIAGNOSIS — Z171 Estrogen receptor negative status [ER-]: Secondary | ICD-10-CM | POA: Diagnosis not present

## 2022-09-30 MED ORDER — GADOPICLENOL 0.5 MMOL/ML IV SOLN
7.0000 mL | Freq: Once | INTRAVENOUS | Status: AC | PRN
  Administered 2022-09-30: 7 mL via INTRAVENOUS

## 2022-09-30 NOTE — Telephone Encounter (Signed)
Primary Cardiologist:Michael Excell Seltzer, MD   Preoperative team, please contact this patient and set up a phone call appointment for further preoperative risk assessment. Please obtain consent and complete medication review. Thank you for your help.   Per office protocol and pending no symptoms of ACS at the time of virtual visit, she may hold Plavix for 5 days prior to procedure and should resume as soon as hemodynamically stable postoperatively.   Levi Aland, NP-C  09/30/2022, 11:42 AM [address], [address] Office [phone] Fax [phone]

## 2022-09-30 NOTE — Telephone Encounter (Signed)
Patient was returning call. Please advise ?

## 2022-09-30 NOTE — Telephone Encounter (Signed)
Pt returned call and is scheduled for 8/8 at 9:40am. Med rec and consent done

## 2022-09-30 NOTE — Telephone Encounter (Signed)
Pt returned call and is scheduled for 8/8 at 9:40am. Med rec and consent done    Patient Consent for Virtual Visit        Jessica [name] has provided verbal consent on 09/30/2022 for a virtual visit (video or telephone).   CONSENT FOR VIRTUAL VISIT FOR:  Jessica [name]  By participating in this virtual visit I agree to the following:  I hereby voluntarily request, consent and authorize [facility] and its employed or contracted physicians, physician assistants, nurse practitioners or other licensed health care professionals (the Practitioner), to provide me with telemedicine health care services (the "Services") as deemed necessary by the treating Practitioner. I acknowledge and consent to receive the Services by the Practitioner via telemedicine. I understand that the telemedicine visit will involve communicating with the Practitioner through live audiovisual communication technology and the disclosure of certain medical information by electronic transmission. I acknowledge that I have been given the opportunity to request an in-person assessment or other available alternative prior to the telemedicine visit and am voluntarily participating in the telemedicine visit.  I understand that I have the right to withhold or withdraw my consent to the use of telemedicine in the course of my care at any time, without affecting my right to future care or treatment, and that the Practitioner or I may terminate the telemedicine visit at any time. I understand that I have the right to inspect all information obtained and/or recorded in the course of the telemedicine visit and may receive copies of available information for a reasonable fee.  I understand that some of the potential risks of receiving the Services via telemedicine include:  Delay or interruption in medical evaluation due to technological equipment failure or disruption; Information transmitted may not be sufficient (e.g. poor  resolution of images) to allow for appropriate medical decision making by the Practitioner; and/or  In rare instances, security protocols could fail, causing a breach of personal health information.  Furthermore, I acknowledge that it is my responsibility to provide information about my medical history, conditions and care that is complete and accurate to the best of my ability. I acknowledge that Practitioner's advice, recommendations, and/or decision may be based on factors not within their control, such as incomplete or inaccurate data provided by me or distortions of diagnostic images or specimens that may result from electronic transmissions. I understand that the practice of medicine is not an exact science and that Practitioner makes no warranties or guarantees regarding treatment outcomes. I acknowledge that a copy of this consent can be made available to me via my patient portal [facility] MyChart), or I can request a printed copy by calling the office of [facility].    I understand that my insurance will be billed for this visit.   I have read or had this consent read to me. I understand the contents of this consent, which adequately explains the benefits and risks of the Services being provided via telemedicine.  I have been provided ample opportunity to ask questions regarding this consent and the Services and have had my questions answered to my satisfaction. I give my informed consent for the services to be provided through the use of telemedicine in my medical care

## 2022-09-30 NOTE — Telephone Encounter (Signed)
I called pt to schedule tele appt. She stated she was trying to talk via her smart watch. She accidentally hung up. I called again and lvmtrc.

## 2022-09-30 NOTE — Telephone Encounter (Signed)
   Pre-operative Risk Assessment    Patient Name: Jessica [name]  DOB: Sep 28, 1940 MRN: [id number]      Request for Surgical Clearance    Procedure:   LEFT SIMPLE MASTECTOMY   Date of Surgery:  Clearance TBD                                 Surgeon:  DR. Harriette Bouillon  Surgeon's Group or Practice Name:  [facility] Phone number:  [phone] Fax number:  [phone] ATTN: Brennan Bailey, CMA   Type of Clearance Requested:   - Medical ; PLAVIX   Type of Anesthesia:  General    Additional requests/questions:    [name]   09/30/2022, 11:09 AM

## 2022-10-03 ENCOUNTER — Encounter: Payer: Self-pay | Admitting: Hematology and Oncology

## 2022-10-04 ENCOUNTER — Inpatient Hospital Stay (HOSPITAL_BASED_OUTPATIENT_CLINIC_OR_DEPARTMENT_OTHER): Payer: Medicare Other | Admitting: Adult Health

## 2022-10-04 ENCOUNTER — Encounter: Payer: Self-pay | Admitting: Adult Health

## 2022-10-04 ENCOUNTER — Encounter: Payer: Self-pay | Admitting: Hematology and Oncology

## 2022-10-04 ENCOUNTER — Encounter: Payer: Self-pay | Admitting: *Deleted

## 2022-10-04 ENCOUNTER — Telehealth: Payer: Self-pay

## 2022-10-04 ENCOUNTER — Other Ambulatory Visit: Payer: Self-pay

## 2022-10-04 VITALS — BP 112/65 | HR 76 | Temp 98.8°F | Resp 16 | Wt 156.7 lb

## 2022-10-04 DIAGNOSIS — Z171 Estrogen receptor negative status [ER-]: Secondary | ICD-10-CM | POA: Diagnosis not present

## 2022-10-04 DIAGNOSIS — C50412 Malignant neoplasm of upper-outer quadrant of left female breast: Secondary | ICD-10-CM

## 2022-10-04 NOTE — Progress Notes (Signed)
[facility] Cancer Follow up:    Jessica Gammon, MD [address] [place] [address]   DIAGNOSIS:  Cancer Staging  Malignant neoplasm of upper-outer quadrant of left breast in female, estrogen receptor negative (HCC) Staging form: Breast, AJCC 8th Edition - Clinical: Stage IIB (cT2, cN0, cM0, G2, ER-, PR-, HER2-) - Signed by Serena Croissant, MD on 09/21/2022 Stage prefix: Initial diagnosis Histologic grading system: 3 grade system   SUMMARY OF ONCOLOGIC HISTORY: Oncology History  Malignant neoplasm of upper-outer quadrant of left breast in female, estrogen receptor negative (HCC)  09/13/2022 Initial Diagnosis   Screening mammogram detected spiculated left breast mass upper outer quadrant 5 o'clock position measuring 2.1 cm, axilla negative, biopsy revealed 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 Indeterminate 1.2 cm right breast mass    09/30/2022 Initial Biopsy   Right breast biopsy demonstrates intraductal papilloma and no evidence of atypia or malignancy.   10/18/2022 Surgery   Left mastectomy scheduled with Dr. Luisa Hart     CURRENT THERAPY: pre-surgery  INTERVAL HISTORY: Jessica [name] 82 y.o. female returns for follow-up and evaluation of left breast tenderness.  She says that this has been waxing and waning and she is concerned that it may be getting worse and her cancer could be growing.  She is otherwise feeling well.    Patient Active Problem List   Diagnosis Date Noted   Malignant neoplasm of upper-outer quadrant of left breast in female,  estrogen receptor negative (HCC) 09/21/2022   Atherosclerotic heart disease of native coronary artery with other forms of angina pectoris (HCC) 04/08/2022   Swelling of left foot 04/07/2022   Left hand fracture, sequela 04/07/2022   Depression with anxiety 04/07/2022   NSTEMI (non-ST elevated myocardial infarction) (HCC) 09/01/2020   Closed fracture of right distal humerus 04/10/2019   Dyspnea 05/09/2018   Vitamin B 12 deficiency 05/09/2018   Cough 03/06/2018   Senile osteoporosis 11/20/2017   Iron deficiency 04/26/2017   Lichen sclerosus 04/26/2017   External hemorrhoid 04/26/2017   Weakness of left leg 06/07/2016   Balance disorder 05/24/2016   Memory deficit 05/24/2016   Anemia 05/19/2016   Hearing loss 05/03/2016   Hypothyroidism 07/24/2013   Bradycardia on ECG 07/24/2013   Numbness 08/07/2012   HYPERLIPIDEMIA 12/29/2006    is allergic to atorvastatin.  MEDICAL HISTORY: Past Medical History:  Diagnosis Date   Anemia 12/2015   Bronchitis, chronic (HCC)    in her 11s   Cataract    Chronic kidney disease    in 20s had nephritis   Closed fracture of right distal humerus    right   Coronary artery disease    Hand, foot and mouth disease (HFMD)    Hearing loss 05/03/2016   High cholesterol    Hypothyroidism    Osteopenia    Pericarditis 01/1999   Pneumothorax 01/1999   Thyroid disorder    Vitamin B 12 deficiency     SURGICAL HISTORY: Past Surgical History:  Procedure Laterality Date   birthmark  removed,left leg   BREAST BIOPSY Left 09/13/2022   [place] LT BREAST BX W LOC DEV 1ST LESION IMG BX SPEC US GUIDE 09/13/2022 [facility] MAMMO[facility]   CARDIAC CATHETERIZATION     CATARACT EXTRACTION  2017   CORONARY STENT INTERVENTION N/A 09/01/2020   Procedure: CORONARY STENT INTERVENTION;  Surgeon: Lyn Records, MD;  Location: [facility];  Service: Cardiovascular;  Laterality: N/A;   LEFT HEART CATH AND CORONARY ANGIOGRAPHY N/A 09/01/2020   Procedure: LEFT HEART  CATH AND CORONARY ANGIOGRAPHY;  Surgeon: Lyn Records, MD;  Location: [facility];  Service: Cardiovascular;  Laterality: N/A;   ORIF HUMERUS FRACTURE Right 04/10/2019   Procedure: RIGHT OPEN REDUCTION INTERNAL FIXATION (ORIF) DISTAL HUMERUS FRACTURE WITH EXTENSION;  Surgeon: Bjorn Pippin, MD;  Location: [facility];  Service: Orthopedics;  Laterality: Right;   TONSILLECTOMY AND ADENOIDECTOMY  1951?    SOCIAL HISTORY: Social History   Socioeconomic History   Marital status: Divorced    Spouse name: Not on file   Number of children: 1   Years of education: college   Highest education level: Not on file  Occupational History   Occupation: retired  Tobacco Use   Smoking status: Never   Smokeless tobacco: Never  Vaping Use   Vaping status: Never Used  Substance and Sexual Activity   Alcohol use: Yes    Alcohol/week: 3.0 standard drinks of alcohol    Types: 3 Glasses of wine per week    Comment: social   Drug use: No   Sexual activity: Not on file  Other Topics Concern   Not on file  Social History Narrative   Moved to [facility] 01/14/2015   Do you drink/eat things with caffeine?  yes   Marital status?      divorced                               Do you live in a house, apartment, assisted living, condo, trailer, etc.?  Retirement community   Is it one or more stories? Yes, 3   How many persons live in your home?  1   Do you have any pets in your home? (please list) 0   Current or past profession:  Librarian   Do you exercise?     yes                                 Type & how often? Walk-varies   Do you have a living will? yes   Do you have a DNR form?         no                         If not, do you want to discuss one? yes   Do you have signed POA/HPOA for forms? yes   Social Determinants of Health   Financial Resource Strain: Low Risk  (07/11/2022)   Overall Financial Resource Strain (CARDIA)    Difficulty of Paying Living Expenses: Not  hard at all  Food Insecurity: No Food Insecurity (07/11/2022)   Hunger Vital Sign    Worried About Running Out of Food in the Last Year: Never true    Ran Out of Food in the Last Year: Never true  Transportation Needs: No Transportation Needs (03/29/2017)  PRAPARE - Administrator, Civil Service (Medical): No    Lack of Transportation (Non-Medical): No  Physical Activity: Insufficiently Active (07/11/2022)   Exercise Vital Sign    Days of Exercise per Week: 3 days    Minutes of Exercise per Session: 30 min  Stress: No Stress Concern Present (07/11/2022)   [employer] of Occupational Health - Occupational Stress Questionnaire    Feeling of Stress : Only a little  Social Connections: Moderately Integrated (07/11/2022)   Social Connection and Isolation Panel [NHANES]    Frequency of Communication with Friends and Family: Once a week    Frequency of Social Gatherings with Friends and Family: Three times a week    Attends Religious Services: More than 4 times per year    Active Member of Clubs or Organizations: Yes    Attends Banker Meetings: Never    Marital Status: Widowed  Intimate Partner Violence: Not At Risk (07/11/2022)   Humiliation, Afraid, Rape, and Kick questionnaire    Fear of Current or Ex-Partner: No    Emotionally Abused: No    Physically Abused: No    Sexually Abused: No    FAMILY HISTORY: Family History  Problem Relation Age of Onset   Diabetes Mother    Asthma Mother    Ataxia Father    Stroke Father    Cancer - Cervical Sister    Thyroid disease Sister    Diabetes Maternal Grandmother    Arthritis Paternal Grandmother    Diabetes Other    Colon cancer Neg Hx    Breast cancer Neg Hx     Review of Systems  Constitutional:  Negative for appetite change, chills, fatigue, fever and unexpected weight change.  HENT:   Negative for hearing loss, lump/mass and trouble swallowing.   Eyes:  Negative for eye problems and icterus.   Respiratory:  Negative for chest tightness, cough and shortness of breath.   Cardiovascular:  Negative for chest pain, leg swelling and palpitations.  Gastrointestinal:  Negative for abdominal distention, abdominal pain, constipation, diarrhea, nausea and vomiting.  Endocrine: Negative for hot flashes.  Genitourinary:  Negative for difficulty urinating.   Musculoskeletal:  Negative for arthralgias.  Skin:  Negative for itching and rash.  Neurological:  Negative for dizziness, extremity weakness, headaches and numbness.  Hematological:  Negative for adenopathy. Does not bruise/bleed easily.  Psychiatric/Behavioral:  Negative for depression. The patient is not nervous/anxious.       PHYSICAL EXAMINATION    Vitals:   10/04/22 1338  BP: 112/65  Pulse: 76  Resp: 16  Temp: 98.8 F (37.1 C)  SpO2: 93%    Physical Exam Constitutional:      General: She is not in acute distress.    Appearance: Normal appearance. She is not ill-appearing.  HENT:     Head: Normocephalic and atraumatic.  Eyes:     General: No scleral icterus. Chest:     Comments: There is a small 2 cm mass in the left breast at 3:00 2 cm from the nipple.  There are other areas of tenderness throughout the breast however without any nodularity.  Her right breast is status postbiopsy with ecchymosis around the biopsy site along with a dressing that is intact. Skin:    General: Skin is warm and dry.     Findings: No rash.  Neurological:     General: No focal deficit present.     Mental Status: She is alert.  Psychiatric:  Mood and Affect: Mood normal.        Behavior: Behavior normal.     LABORATORY DATA:  CBC    Component Value Date/Time   WBC 7.4 05/30/2022 0811   RBC 4.15 05/30/2022 0811   HGB 13.6 05/30/2022 0811   HGB 13.0 05/06/2021 1548   HGB 13.1 04/27/2017 0000   HCT 39.2 05/30/2022 0811   HCT 38.5 05/06/2021 1548   PLT 257 05/30/2022 0811   PLT 265 05/06/2021 1548   MCV 94.5  05/30/2022 0811   MCV 97 05/06/2021 1548   MCH 32.8 05/30/2022 0811   MCHC 34.7 05/30/2022 0811   RDW 12.6 05/30/2022 0811   RDW 12.9 05/06/2021 1548   LYMPHSABS 2,213 05/30/2022 0811   MONOABS 0.6 08/31/2020 2133   EOSABS 148 05/30/2022 0811   BASOSABS 37 05/30/2022 0811    CMP     Component Value Date/Time   NA 140 05/30/2022 0811   NA 146 (H) 05/06/2021 1548   NA 140 04/27/2017 0000   K 4.6 05/30/2022 0811   K 4.3 04/27/2017 0000   CL 107 05/30/2022 0811   CO2 24 05/30/2022 0811   GLUCOSE 91 05/30/2022 0811   BUN 16 05/30/2022 0811   BUN 16 05/06/2021 1548   CREATININE 0.58 (L) 05/30/2022 0811   CALCIUM 8.9 05/30/2022 0811   CALCIUM 9.0 04/27/2017 0000   PROT 6.4 05/30/2022 0811   PROT 6.1 03/16/2021 0920   ALBUMIN 4.2 03/16/2021 0920   ALBUMIN 3.9 04/27/2017 0000   AST 15 05/30/2022 0811   AST 17 04/27/2017 0000   ALT 15 05/30/2022 0811   ALT 16 04/27/2017 0000   ALKPHOS 59 03/16/2021 0920   ALKPHOS 64 04/27/2017 0000   BILITOT 0.5 05/30/2022 0811   BILITOT 0.4 03/16/2021 0920   BILITOT 0.6 04/27/2017 0000   GFRNONAA >60 09/02/2020 0249   GFRNONAA 89 07/23/2020 0815   GFRAA 103 07/23/2020 0815       ASSESSMENT and THERAPY PLAN:   Malignant neoplasm of upper-outer quadrant of left breast in female, estrogen receptor negative (HCC) 09/13/2022: Screening mammogram detected spiculated left breast mass upper outer quadrant 5 o'clock position measuring 2.1 cm, axilla negative, 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  1.2 cm right breast mass biopsy on July 26: Intraductal papilloma for malignancy or atypia  Recommendation: Right breast biopsy Left mastectomy without sentinel lymph node biopsy Consideration for adjuvant CMF if it is truly triple negative Adjuvant radiation therapy    ____________________________________________________________________________________________________________________  Gracia's left breast tenderness is likely secondary to the cyst that she has in her breast that were noted on her mammogram.  Her tumor does not feel substantially larger than it did previously.  Her right breast biopsy site is also healing slowly.  There is some bruising that we will take some time to fade away.  Shea told me that she is considering bilateral mastectomies.  I encouraged her to go ahead and call Dr. Rosezena Sensor office and let them know that she is wanting to consider this since her surgery is already scheduled for August 13.  She has follow-up with Dr. Pamelia Hoit on October 13, 2022.   All questions were answered. The patient knows to call the clinic with any problems, questions or concerns. We can certainly see the patient much sooner if necessary.  Total  encounter time:20 minutes*in face-to-face visit time, chart review, lab review, care coordination, order entry, and documentation of the encounter time.  Lillard Anes, NP 10/04/22 2:09 PM [facility] [address] [place], [place] [address] Tel. [phone]    Fax. [phone]  *Total Encounter Time as defined by the Centers for Medicare and Medicaid Services includes, in addition to the face-to-face time of a patient visit (documented in the note above) non-face-to-face time: obtaining and reviewing outside history, ordering and reviewing medications, tests or procedures, care coordination (communications with other health care professionals or caregivers) and documentation in the medical record.

## 2022-10-04 NOTE — Telephone Encounter (Signed)
Called pt regarding left breast tenderness concern. She was offered an appt for today at 1:45 with Lillard Anes, DNP to further evaluate. She knows to arrive at 1:30 for check in.

## 2022-10-04 NOTE — Assessment & Plan Note (Signed)
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  1.2 cm right breast mass biopsy on July 26: Intraductal papilloma for malignancy or atypia  Recommendation: Right breast biopsy Left mastectomy without sentinel lymph node biopsy Consideration for adjuvant CMF if it is truly triple negative Adjuvant radiation therapy   ____________________________________________________________________________________________________________________  Jessica [name]'s left breast tenderness is likely secondary to the cyst that she has in her breast that were noted on her mammogram.  Her tumor does not feel substantially larger than it did previously.  Her right breast biopsy site is also healing slowly.  There is some bruising that we will take some time to fade away.  Jessica [name] told me that she is considering bilateral mastectomies.  I encouraged her to go ahead and call Dr. Rosezena Sensor office and let them know that she is wanting to consider this since her surgery is already scheduled for August 13.  She has follow-up with Dr. Pamelia Hoit on October 13, 2022.

## 2022-10-05 ENCOUNTER — Encounter: Payer: Self-pay | Admitting: Internal Medicine

## 2022-10-05 ENCOUNTER — Non-Acute Institutional Stay: Payer: Medicare Other | Admitting: Internal Medicine

## 2022-10-05 ENCOUNTER — Ambulatory Visit: Payer: Self-pay | Admitting: Surgery

## 2022-10-05 VITALS — BP 124/80 | HR 69 | Temp 97.6°F | Resp 17 | Ht 63.0 in | Wt 157.3 lb

## 2022-10-05 DIAGNOSIS — G629 Polyneuropathy, unspecified: Secondary | ICD-10-CM | POA: Diagnosis not present

## 2022-10-05 DIAGNOSIS — I214 Non-ST elevation (NSTEMI) myocardial infarction: Secondary | ICD-10-CM

## 2022-10-05 DIAGNOSIS — M810 Age-related osteoporosis without current pathological fracture: Secondary | ICD-10-CM

## 2022-10-05 DIAGNOSIS — E039 Hypothyroidism, unspecified: Secondary | ICD-10-CM | POA: Diagnosis not present

## 2022-10-05 DIAGNOSIS — C50412 Malignant neoplasm of upper-outer quadrant of left female breast: Secondary | ICD-10-CM

## 2022-10-05 DIAGNOSIS — E782 Mixed hyperlipidemia: Secondary | ICD-10-CM | POA: Diagnosis not present

## 2022-10-05 DIAGNOSIS — Z171 Estrogen receptor negative status [ER-]: Secondary | ICD-10-CM | POA: Diagnosis not present

## 2022-10-05 NOTE — Progress Notes (Unsigned)
Location:  [facility] Service:  Clinic (12)  Provider:   Code Status:  Goals of Care:     10/05/2022   10:31 AM  Advanced Directives  Does Patient Have a Medical Advance Directive? Yes  Type of Estate agent of [place];Living will  Does patient want to make changes to medical advance directive? No - Patient declined  Copy of Healthcare Power of Attorney in Chart? No - copy requested     Chief Complaint  Patient presents with   Medical Management of Chronic Issues    Patient is being seen for a 4 month follow up and discuss labs    Immunizations    Patient is due for updated covid vaccine     HPI: Patient is a 82 y.o. female seen today for medical management of chronic diseases.    Recent diagnosis of Breast cancer Plan for bilateral Mastectomy  Patient is doing well with diagnosis Does have friends here who are going to help her  Neuropathy Doing well on Low dose of Neurontin  Patient h/o  NSTEMI 06/22 cardiac cath which showed subtotal occlusion of an LAD.  Underwent successful angioplasty and DES.  Does have residual 50% RCA lesion Her EF was 50-55% with No Wall motion Abnormalities now   H/o umbilical hernia Surgery said no issues History of osteoporosis  Hyperlipidemia, hypothyroidism,  history of peripheral neuropathy  history of shortness of breath with exertion  Past Medical History:  Diagnosis Date   Anemia 12/2015   Bronchitis, chronic (HCC)    in her 79s   Cataract    Chronic kidney disease    in 20s had nephritis   Closed fracture of right distal humerus    right   Coronary artery disease    Hand, foot and mouth disease (HFMD)    Hearing loss 05/03/2016   High cholesterol    Hypothyroidism    Osteopenia    Pericarditis 01/1999   Pneumothorax 01/1999   Thyroid disorder    Vitamin B 12 deficiency     Past Surgical History:  Procedure Laterality Date   birthmark      removed,left leg   BREAST  BIOPSY Left 09/13/2022   [place] LT BREAST BX W LOC DEV 1ST LESION IMG BX SPEC US GUIDE 09/13/2022 [facility]   CARDIAC CATHETERIZATION     CATARACT EXTRACTION  2017   CORONARY STENT INTERVENTION N/A 09/01/2020   Procedure: CORONARY STENT INTERVENTION;  Surgeon: Lyn Records, MD;  Location: [facility];  Service: Cardiovascular;  Laterality: N/A;   LEFT HEART CATH AND CORONARY ANGIOGRAPHY N/A 09/01/2020   Procedure: LEFT HEART CATH AND CORONARY ANGIOGRAPHY;  Surgeon: Lyn Records, MD;  Location: [facility];  Service: Cardiovascular;  Laterality: N/A;   ORIF HUMERUS FRACTURE Right 04/10/2019   Procedure: RIGHT OPEN REDUCTION INTERNAL FIXATION (ORIF) DISTAL HUMERUS FRACTURE WITH EXTENSION;  Surgeon: Bjorn Pippin, MD;  Location: [facility];  Service: Orthopedics;  Laterality: Right;   TONSILLECTOMY AND ADENOIDECTOMY  1951?    Allergies  Allergen Reactions   Atorvastatin     Muscle weakness    Outpatient Encounter Medications as of 10/05/2022  Medication Sig   acetaminophen (TYLENOL) 650 MG CR tablet Take 1,300 mg by mouth every 8 (eight) hours as needed for pain.   Calcium Carb-Cholecalciferol (CALCIUM 600+D) 600-800 MG-UNIT TABS Take 2 tablets by mouth daily.   cholecalciferol (VITAMIN D) 1000 UNITS tablet  Take 2,000 Units by mouth daily.    clobetasol ointment (TEMOVATE) 0.05 % Apply 1 Application topically as needed.   clopidogrel (PLAVIX) 75 MG tablet TAKE 1 TABLET BY MOUTH DAILY   denosumab (PROLIA) 60 MG/ML SOSY injection Inject 60 mg into the skin every 6 (six) months.   ferrous sulfate 325 (65 FE) MG tablet Take 325 mg by mouth daily with breakfast.   gabapentin (NEURONTIN) 100 MG capsule Take 100 mg by mouth daily.   levothyroxine (SYNTHROID) 112 MCG tablet Take 1 tablet (112 mcg total) by mouth daily.   loratadine (CLARITIN) 10 MG tablet Take 10 mg by mouth as needed.   melatonin 5 MG TABS Take 5 mg by mouth daily.   nitroGLYCERIN (NITROSTAT)  0.4 MG SL tablet TAKE 1 TABLET UNDER THE TONGUE EVERY 5 MINUTES AS NEEDED FOR CHEST PAIN   rosuvastatin (CRESTOR) 20 MG tablet TAKE 1 TABLET BY MOUTH DAILY   Study - [name] - olpasiran (AMG 890) 142 mg/mL or placebo SQ injection (PI-Hilty) Inject 142 mg into the skin once. For investigational use only - Inject 1 mL (1 prefilled syringe) subcutaneously into appropriate injection site per protocol. (Approved injection site(s): upper arm, upper thigh, abdomen). Please contact [facility] if any questions.   triamcinolone cream (KENALOG) 0.1 % Apply 1 Application topically 2 (two) times daily as needed.   vitamin C (ASCORBIC ACID) 500 MG tablet Take 500 mg by mouth daily.   [DISCONTINUED] calcium carbonate (OS-CAL) 1250 (500 Ca) MG chewable tablet Chew 1 tablet (1,250 mg total) by mouth daily.   No facility-administered encounter medications on file as of 10/05/2022.    Review of Systems:  Review of Systems  Constitutional:  Negative for activity change and appetite change.  HENT: Negative.    Respiratory:  Negative for cough and shortness of breath.   Cardiovascular:  Negative for leg swelling.  Gastrointestinal:  Negative for constipation.  Genitourinary: Negative.   Musculoskeletal:  Negative for arthralgias, gait problem and myalgias.  Skin: Negative.   Neurological:  Negative for dizziness and weakness.  Psychiatric/Behavioral:  Negative for confusion, dysphoric mood and sleep disturbance.     Health Maintenance  Topic Date Due   COVID-19 Vaccine (6 - 2023-24 season) 12/29/2022 (Originally 10/01/2022)   Colonoscopy  02/03/2026 (Originally 07/11/2020)   INFLUENZA VACCINE  10/06/2022   Medicare Annual Wellness (AWV)  07/11/2023   DTaP/Tdap/Td (4 - Td or Tdap) 03/24/2032   Pneumonia Vaccine 38+ Years old  Completed   DEXA SCAN  Completed   Zoster Vaccines- Shingrix  Completed   HPV VACCINES  Aged Out    Physical Exam: Vitals:   10/05/22 1028  BP: 124/80  Pulse: 69   Resp: 17  Temp: 97.6 F (36.4 C)  TempSrc: Temporal  SpO2: 95%  Weight: 157 lb 4.8 oz (71.4 kg)  Height: 5\' 3"  (1.6 m)   Body mass index is 27.86 kg/m. Physical Exam Vitals reviewed.  Constitutional:      Appearance: Normal appearance.  HENT:     Head: Normocephalic.     Nose: Nose normal.     Mouth/Throat:     Mouth: Mucous membranes are moist.     Pharynx: Oropharynx is clear.  Eyes:     Pupils: Pupils are equal, round, and reactive to light.  Cardiovascular:     Rate and Rhythm: Normal rate and regular rhythm.     Pulses: Normal pulses.     Heart sounds: Normal heart sounds. No murmur heard. Pulmonary:  Effort: Pulmonary effort is normal.     Breath sounds: Normal breath sounds.  Abdominal:     General: Abdomen is flat. Bowel sounds are normal.     Palpations: Abdomen is soft.  Musculoskeletal:        General: No swelling.     Cervical back: Neck supple.  Skin:    General: Skin is warm.  Neurological:     General: No focal deficit present.     Mental Status: She is alert and oriented to person, place, and time.  Psychiatric:        Mood and Affect: Mood normal.        Thought Content: Thought content normal.     Labs reviewed: Basic Metabolic Panel: Recent Labs    12/30/21 0800 05/30/22 0811  NA 139 140  K 4.0 4.6  CL 105 107  CO2 27 24  GLUCOSE 108* 91  BUN 20 16  CREATININE 0.64 0.58*  CALCIUM 8.6 8.9  TSH  --  1.09   Liver Function Tests: Recent Labs    12/30/21 0800 05/30/22 0811  AST 16 15  ALT 14 15  BILITOT 0.5 0.5  PROT 6.0* 6.4   No results for input(s): "LIPASE", "AMYLASE" in the last 8760 hours. No results for input(s): "AMMONIA" in the last 8760 hours. CBC: Recent Labs    12/30/21 0758 05/30/22 0811  WBC 7.9 7.4  NEUTROABS 4,645 4,433  HGB 13.4 13.6  HCT 38.3 39.2  MCV 95.0 94.5  PLT 253 257   Lipid Panel: Recent Labs    05/30/22 0811  CHOL 150  HDL 58  LDLCALC 75  TRIG 90  CHOLHDL 2.6   Lab Results   Component Value Date   HGBA1C 5.9 (H) 04/11/2022    Procedures since last visit: MR RT BREAST BX W LOC DEV 1ST LESION IMAGE BX SPEC MR GUIDE  Addendum Date: 10/03/2022   ADDENDUM REPORT: 10/03/2022 11:43 ADDENDUM: PATHOLOGY revealed: Site breast, RIGHT, needle core biopsy, outer central, barbell clip PROLIFERATIVE FIBROCYSTIC CHANGES WITH SMALL INTRADUCTAL PAPILLOMA, 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 Bary Richard with only follow up imaging protocol recommended, per working group guidelines for INCIDENTAL papilloma. Pathology results and recommendations below were discussed with patient by telephone on 10/03/2022. Patient reported biopsy site with slight tenderness at the site. Post biopsy care instructions were reviewed, questions were answered and my direct phone number was provided to patient. Patient was instructed to call the [facility] if any concerns or questions arise related to the biopsy. The patient was instructed to return for a bilateral breast MRI in 6 months, per protocol. The patient has a recent diagnosis of LEFT breast cancer and should follow her outlined treatment plan. Pathology results reported by Lynett Grimes, RN on 10/03/2022. Electronically Signed   By: Bary Richard M.D.   On: 10/03/2022 11:43   Result Date: 10/03/2022 CLINICAL DATA:  Patient presents today for MRI-guided biopsy of indeterminate enhancement within the outer central RIGHT breast. EXAM: MRI GUIDED CORE NEEDLE BIOPSY OF THE RIGHT BREAST TECHNIQUE: Multiplanar, multisequence MR imaging of the RIGHT breast was performed both before and after administration of intravenous contrast. CONTRAST:  7 cc Vueway COMPARISON:  Previous exam(s). FINDINGS: I met with the patient, and we discussed the procedure of MRI guided biopsy, including risks, benefits, and alternatives. Specifically, we discussed the risks of infection, bleeding,  tissue injury, clip migration, and inadequate  sampling. Informed, written consent was given. The usual time out protocol was performed immediately prior to the procedure. Using sterile technique, 1% Lidocaine, MRI guidance, and a 9 gauge vacuum assisted device, biopsy was performed of the indeterminate enhancement within the outer central RIGHT breast using a lateral approach. At the conclusion of the procedure, a barbell tissue marker clip was deployed into the biopsy cavity. Follow-up 2-view mammogram was performed and dictated separately. IMPRESSION: MRI guided biopsy of the indeterminate enhancement within the outer central RIGHT breast. No apparent complications. Electronically Signed: By: Bary Richard M.D. On: 09/30/2022 08:58   MM CLIP PLACEMENT RIGHT  Result Date: 09/30/2022 CLINICAL DATA:  Status post MRI guided biopsy for an indeterminate non-mass enhancement within the RIGHT breast. Recent diagnosis of LEFT breast cancer. EXAM: 3D DIAGNOSTIC RIGHT MAMMOGRAM POST MRI BIOPSY COMPARISON:  Previous exam(s). FINDINGS: 3D Mammographic images were obtained following MRI guided biopsy of the non-mass enhancement within the outer central RIGHT breast. The biopsy marking clip is in expected position at the site of biopsy. IMPRESSION: Appropriate positioning of the barbell shaped biopsy marking clip at the site of biopsy in the outer central RIGHT breast corresponding to the area of the targeted non-mass enhancement within the outer central RIGHT breast. Final Assessment: Post Procedure Mammograms for Marker Placement Electronically Signed   By: Bary Richard M.D.   On: 09/30/2022 09:16   MR BREAST BILATERAL W WO CONTRAST INC CAD  Result Date: 09/27/2022 CLINICAL DATA:  82 year old female presents for further evaluation of newly diagnosed LEFT breast invasive lobular carcinoma. EXAM: BILATERAL BREAST MRI WITH AND WITHOUT CONTRAST TECHNIQUE: Multiplanar, multisequence MR images of both breasts were obtained  prior to and following the intravenous administration of 7 ml of Vueway Three-dimensional MR images were rendered by post-processing of the original MR data on an independent workstation. The three-dimensional MR images were interpreted, and findings are reported in the following complete MRI report for this study. Three dimensional images were evaluated at the independent interpreting workstation using the DynaCAD thin client. COMPARISON:  None available. FINDINGS: Breast composition: b. Scattered fibroglandular tissue. Background parenchymal enhancement: Mild Right breast: 1.2 cm linear enhancement within the slightly OUTER central RIGHT breast (image 101: Series 12) is indeterminate. No other suspicious mass or worrisome enhancement is identified within the RIGHT breast. Left breast: A 2.5 x 2.3 x 2.2 cm (AP x transverse x CC) irregular mass within the posterior slightly OUTER LEFT breast contains biopsy clip artifact and compatible with biopsy-proven malignancy. There is ill-defined and nodular non masslike enhancement extending ANTERIOR, LATERAL and INFERIOR to this biopsy-proven malignancy with the entire area measuring 8 x 5.9 x 3.5 cm. No suspicious abnormalities are identified within the UPPER LEFT breast. Lymph nodes: No abnormal appearing lymph nodes. Ancillary findings:  None. IMPRESSION: 1. 2.5 cm biopsy-proven malignancy within the posterior slightly OUTER LEFT breast. However ill-defined and nodular non masslike enhancement extending ANTERIOR, LATERAL and INFERIOR to the biopsy-proven malignancy is worrisome for additional malignancy, with the entire area measuring 8 x 5.9 x 3.5 cm (AP x transverse x CC). Consider MR guided biopsies of the ANTERIOR and INFERIOR aspects of this non masslike enhancement if this would change management of the LEFT side. 2. Indeterminate 1.2 cm linear enhancement within the slightly OUTER central RIGHT breast. MR guided biopsy is recommended. 3. No abnormal appearing  lymph nodes. RECOMMENDATION: MR guided biopsy of the RIGHT breast. Recommend 2 site MR guided biopsy of the LEFT breast if this would change management on  the LEFT side. BI-RADS CATEGORY  4: Suspicious. Electronically Signed   By: Harmon Pier M.D.   On: 09/27/2022 09:36   [place] LT BREAST BX W LOC DEV 1ST LESION IMG BX SPEC US GUIDE  Addendum Date: 09/16/2022   ADDENDUM REPORT: 09/16/2022 08:25 ADDENDUM: Pathology revealed GRADE II INVASIVE LOBULAR CARCINOMA, LOBULAR CARCINOMA IN SITU of the LEFT breast, 3 o'clock axis, 5 cmfn, (heart clip). This was found to be concordant by Dr. Bary Richard. Pathology results were discussed with the patient by telephone. The patient reported doing well after the biopsy with tenderness at the site. Post biopsy instructions and care were reviewed and questions were answered. The patient was encouraged to call The [facility] for any additional concerns. My direct phone number was provided. Medical oncology consultation has been arranged with Dr. Serena Croissant, per patient request, at [facility] on September 21, 2022. Surgical consultation has been arranged with Dr. Harriette Bouillon at [facility] on September 30, 2022. Recommendation for a bilateral breast MRI given the lobular histology. Pathology results reported by Rene Kocher, RN on 09/15/2022. Electronically Signed   By: Bary Richard M.D.   On: 09/16/2022 08:25   Result Date: 09/16/2022 CLINICAL DATA:  Patient presents today for ultrasound-guided core biopsy of a suspicious mass in the LEFT breast at the 3 o'clock axis. EXAM: ULTRASOUND GUIDED LEFT BREAST CORE NEEDLE BIOPSY COMPARISON:  Previous exam(s). PROCEDURE: I met with the patient and we discussed the procedure of ultrasound-guided biopsy, including benefits and alternatives. We discussed the high likelihood of a successful procedure. We discussed the risks of the procedure, including infection, bleeding, tissue injury, clip  migration, and inadequate sampling. Informed written consent was given. The usual time-out protocol was performed immediately prior to the procedure. Lesion quadrant: 3 o'clock Using sterile technique and 1% Lidocaine as local anesthetic, under direct ultrasound visualization, a 12 gauge spring-loaded device was used to perform biopsy of the LEFT breast mass at the 3 o'clock axis, 5 cm from the nipple, using a lateral approach. At the conclusion of the procedure , a heart shaped tissue marker clip was deployed into the biopsy cavity. Follow up 2 view mammogram was performed and dictated separately. IMPRESSION: Ultrasound guided biopsy of the LEFT breast mass at the 3 o'clock axis. No apparent complications. Electronically Signed: By: Bary Richard M.D. On: 09/13/2022 13:14   MM CLIP PLACEMENT LEFT  Result Date: 09/13/2022 CLINICAL DATA:  Status post ultrasound-guided biopsy for a LEFT breast mass at the 3 o'clock axis. EXAM: 3D DIAGNOSTIC LEFT MAMMOGRAM POST ULTRASOUND BIOPSY COMPARISON:  Previous exam(s). FINDINGS: 3D Mammographic images were obtained following ultrasound guided biopsy of the LEFT breast mass at the 3 o'clock axis. The biopsy marking clip is in expected position at the site of biopsy. IMPRESSION: Appropriate positioning of the heart shaped biopsy marking clip at the site of biopsy in the outer LEFT breast at posterior depth, corresponding to the targeted mass at the 3 o'clock axis. Final Assessment: Post Procedure Mammograms for Marker Placement Electronically Signed   By: Bary Richard M.D.   On: 09/13/2022 13:26  MM 3D DIAGNOSTIC MAMMOGRAM UNILATERAL LEFT BREAST  Result Date: 09/12/2022 CLINICAL DATA:  Patient was recalled from screening mammogram for possible masses and asymmetry in the left breast. EXAM: DIGITAL DIAGNOSTIC UNILATERAL LEFT MAMMOGRAM WITH TOMOSYNTHESIS AND CAD; ULTRASOUND LEFT BREAST LIMITED TECHNIQUE: Left digital diagnostic mammography and breast tomosynthesis was  performed. The images were evaluated with computer-aided detection. ;  Targeted ultrasound examination of the left breast was performed. COMPARISON:  Previous exam(s). ACR Breast Density Category b: There are scattered areas of fibroglandular density. FINDINGS: Additional imaging of the left breast was performed. There is persistence of an irregular mass far posteriorly in the upper-outer quadrant of the breast. There are smaller circumscribed masses in the lateral aspect of the left breast as well as 1 in the medial aspect of the breast. On physical exam, I palpate thickening in the left breast at 3 o'clock 5 cm from the nipple. Targeted ultrasound is performed, showing an irregular hypoechoic mass in the left breast at 3 o'clock 5 cm from the nipple measuring 2.1 x 1.1 x 1.9 cm. There are mildly complex cysts in the left breast at 2 o'clock 4 cm 4 cm from the nipple measuring 5 x 2 x 4 mm, at 3 o'clock 4 cm from the nipple measuring 5 x 3 x 5 mm and at 10 o'clock 4 cm from the nipple measuring 7 x 3 x 7 mm. Sonographic evaluation of the left axilla does not show any enlarged adenopathy. IMPRESSION: Suspicious 2.1 cm mass in the 3 o'clock region of the left breast 5 cm from the nipple. RECOMMENDATION: Ultrasound-guided core biopsy of the mass in the 3 o'clock region of the left breast is recommended. I have discussed the findings and recommendations with the patient. If applicable, a reminder letter will be sent to the patient regarding the next appointment. BI-RADS CATEGORY  4: Suspicious. Electronically Signed   By: Baird Lyons M.D.   On: 09/12/2022 09:59  [place] LIMITED ULTRASOUND INCLUDING AXILLA LEFT BREAST   Result Date: 09/12/2022 CLINICAL DATA:  Patient was recalled from screening mammogram for possible masses and asymmetry in the left breast. EXAM: DIGITAL DIAGNOSTIC UNILATERAL LEFT MAMMOGRAM WITH TOMOSYNTHESIS AND CAD; ULTRASOUND LEFT BREAST LIMITED TECHNIQUE: Left digital diagnostic mammography and breast  tomosynthesis was performed. The images were evaluated with computer-aided detection. ; Targeted ultrasound examination of the left breast was performed. COMPARISON:  Previous exam(s). ACR Breast Density Category b: There are scattered areas of fibroglandular density. FINDINGS: Additional imaging of the left breast was performed. There is persistence of an irregular mass far posteriorly in the upper-outer quadrant of the breast. There are smaller circumscribed masses in the lateral aspect of the left breast as well as 1 in the medial aspect of the breast. On physical exam, I palpate thickening in the left breast at 3 o'clock 5 cm from the nipple. Targeted ultrasound is performed, showing an irregular hypoechoic mass in the left breast at 3 o'clock 5 cm from the nipple measuring 2.1 x 1.1 x 1.9 cm. There are mildly complex cysts in the left breast at 2 o'clock 4 cm 4 cm from the nipple measuring 5 x 2 x 4 mm, at 3 o'clock 4 cm from the nipple measuring 5 x 3 x 5 mm and at 10 o'clock 4 cm from the nipple measuring 7 x 3 x 7 mm. Sonographic evaluation of the left axilla does not show any enlarged adenopathy. IMPRESSION: Suspicious 2.1 cm mass in the 3 o'clock region of the left breast 5 cm from the nipple. RECOMMENDATION: Ultrasound-guided core biopsy of the mass in the 3 o'clock region of the left breast is recommended. I have discussed the findings and recommendations with the patient. If applicable, a reminder letter will be sent to the patient regarding the next appointment. BI-RADS CATEGORY  4: Suspicious. Electronically Signed   By: Cathe Mons.D.  On: 09/12/2022 09:59  MM 3D SCREENING MAMMOGRAM BILATERAL BREAST  Result Date: 09/07/2022 CLINICAL DATA:  Screening. EXAM: DIGITAL SCREENING BILATERAL MAMMOGRAM WITH TOMOSYNTHESIS AND CAD TECHNIQUE: Bilateral screening digital craniocaudal and mediolateral oblique mammograms were obtained. Bilateral screening digital breast tomosynthesis was performed. The images  were evaluated with computer-aided detection. COMPARISON:  None available. ACR Breast Density Category b: There are scattered areas of fibroglandular density. FINDINGS: In the left breast, possible masses and an asymmetry requires further evaluation. In the right breast, no findings suspicious for malignancy. IMPRESSION: Further evaluation is suggested for a possible masses and asymmetry in the left breast. RECOMMENDATION: Diagnostic mammogram and possibly ultrasound of the left breast. (Code:FI-L-40M) The patient will be contacted regarding the findings, and additional imaging will be scheduled. BI-RADS CATEGORY  0: Incomplete: Need additional imaging evaluation. Electronically Signed   By: Frederico Hamman M.D.   On: 09/07/2022 15:28    Assessment/Plan 1. Peripheral polyneuropathy Doing well with Gabapentin  2. Malignant neoplasm of upper-outer quadrant of left breast in female, estrogen receptor negative (HCC) Plan for Bilateral Matectomy Not planning for reconstruction Chemo dependign on the size of tumor  3. Mixed hyperlipidemia On statin  4. Acquired hypothyroidism tsh  5. Senile osteoporosis ***  6. NSTEMI (non-ST elevated myocardial infarction) (HCC) ***    Labs/tests ordered:  * No order type specified * Next appt:  10/27/2022

## 2022-10-06 ENCOUNTER — Encounter: Payer: Self-pay | Admitting: Hematology and Oncology

## 2022-10-07 ENCOUNTER — Telehealth: Payer: Self-pay | Admitting: *Deleted

## 2022-10-07 NOTE — Telephone Encounter (Signed)
Called pt regarding questions around surgery recovery time and length of surgery. Discussed post op activity and expectations of recovery. [place] with multiple friends that will come and assist with needs if pt needs assistance. Discussed post mastectomy binder and need for compression. Denies further needs or questions at this time. Pt denies need to see Dr. Pamelia Hoit next unless further questions arise. Contact information provided.

## 2022-10-10 ENCOUNTER — Other Ambulatory Visit: Payer: Medicare Other

## 2022-10-11 MED ORDER — CHLORHEXIDINE GLUCONATE CLOTH 2 % EX PADS: 6.0000 | MEDICATED_PAD | Freq: Once | CUTANEOUS | Status: DC

## 2022-10-11 NOTE — Progress Notes (Signed)

## 2022-10-11 NOTE — Progress Notes (Unsigned)
Virtual Visit via Telephone Note   Because of Jessica [name] co-morbid illnesses, she is at least at moderate risk for complications without adequate follow up.  This format is felt to be most appropriate for this patient at this time.  The patient did not have access to video technology/had technical difficulties with video requiring transitioning to audio format only (telephone).  All issues noted in this document were discussed and addressed.  No physical exam could be performed with this format.  Please refer to the patient's chart for her consent to telehealth for Jessica [name].  Evaluation Performed:  Preoperative cardiovascular risk assessment _____________   Date:  10/13/2022   Patient ID:  Jessica [name], DOB [birth year], MRN [id number] Patient Location:  Home Provider location:   Office  Primary Care Provider:  Mahlon Gammon, MD Primary Cardiologist:  Jessica Bollman, MD  Chief Complaint / Patient Profile   82 y.o. y/o female with a h/o coronary artery disease status post NSTEMI in 2022 with subtotal occlusion of the LAD treated with drug-eluting stent (22 x 2.2 mm Onyx) on clopidogrel, normal EF per echocardiogram and 2023 the 55 to 60%, remote history of pericarditis, hyperlipidemia, chronic bronchitis, and anemia.  She is pending left simple mastectomy by Dr. Harriette [name], Jessica [name], on October 18, 2022 and presents today for telephonic preoperative cardiovascular risk assessment.  History of Present Illness    Jessica [name] is a 82 y.o. female who presents via audio/video conferencing for a telehealth visit today.  Pt was last seen in cardiology clinic on 08/03/2022 for by Dr. Tonny [name],.  At that time Jessica [name] was doing well .  The patient is now pending procedure as outlined above. Since her last visit, she walks half a mile each day, works out in a gym afforded to her retirement community.  She denies any chest pain, dyspnea on  exertion, profound fatigue, dizziness or near syncope.  She is medically compliant.  Past Medical History    Past Medical History:  Diagnosis Date   Anemia 12/2015   Bronchitis, chronic (HCC)    in her 91s   Cataract    Chronic kidney disease    in 74s had nephritis   Closed fracture of right distal humerus    right   Coronary artery disease    Hand, foot and mouth disease (HFMD)    Hearing loss 05/03/2016   High cholesterol    Hypothyroidism    Osteopenia    Pericarditis 01/1999   Pneumothorax 01/1999   Thyroid disorder    Vitamin B 12 deficiency    Past Surgical History:  Procedure Laterality Date   birthmark      removed,left leg   BREAST BIOPSY Left 09/13/2022   [place] LT BREAST BX W LOC DEV 1ST LESION IMG BX SPEC US GUIDE 09/13/2022 [facility]   CARDIAC CATHETERIZATION     CATARACT EXTRACTION  2017   CORONARY STENT INTERVENTION N/A 09/01/2020   Procedure: CORONARY STENT INTERVENTION;  Surgeon: Jessica Records, MD;  Location: [facility];  Service: Cardiovascular;  Laterality: N/A;   LEFT HEART CATH AND CORONARY ANGIOGRAPHY N/A 09/01/2020   Procedure: LEFT HEART CATH AND CORONARY ANGIOGRAPHY;  Surgeon: Jessica Records, MD;  Location: [facility];  Service: Cardiovascular;  Laterality: N/A;   ORIF HUMERUS FRACTURE Right 04/10/2019   Procedure: RIGHT OPEN REDUCTION INTERNAL FIXATION (ORIF) DISTAL HUMERUS FRACTURE WITH EXTENSION;  Surgeon: Jessica Pippin, MD;  Location: [facility];  Service: Orthopedics;  Laterality: Right;   TONSILLECTOMY AND ADENOIDECTOMY  1951?    Allergies  No Known Allergies  Home Medications    Prior to Admission medications   Medication Sig Start Date End Date Taking? Authorizing Provider  acetaminophen (TYLENOL) 650 MG CR tablet Take 1,300 mg by mouth every 8 (eight) hours as needed for pain.    [provider]  Calcium Carb-Cholecalciferol (CALCIUM 600+D) 600-800 MG-UNIT TABS Take 2 tablets by mouth  daily.    [provider]  clopidogrel (PLAVIX) 75 MG tablet TAKE 1 TABLET BY MOUTH DAILY 04/13/22   Jessica Bollman, MD  denosumab (PROLIA) 60 MG/ML SOSY injection Inject 60 mg into the skin every 6 (six) months.    [provider]  ferrous sulfate 325 (65 FE) MG tablet Take 325 mg by mouth daily with breakfast.    [provider]  gabapentin (NEURONTIN) 100 MG capsule Take 100 mg by mouth daily.    [provider]  levothyroxine (SYNTHROID) 112 MCG tablet Take 1 tablet (112 mcg total) by mouth daily. 06/24/22   Jessica Gammon, MD  loratadine (CLARITIN) 10 MG tablet Take 10 mg by mouth as needed.    [provider]  melatonin 5 MG TABS Take 5 mg by mouth daily. 09/11/22   [provider]  nitroGLYCERIN (NITROSTAT) 0.4 MG SL tablet TAKE 1 TABLET UNDER THE TONGUE EVERY 5 MINUTES AS NEEDED FOR CHEST PAIN Patient taking differently: Never used 11/25/21   Jessica Bollman, MD  rosuvastatin (CRESTOR) 20 MG tablet TAKE 1 TABLET BY MOUTH DAILY 06/02/22   Jessica Bollman, MD  Study - [name] - olpasiran (AMG 890) 142 mg/mL or placebo SQ injection (PI-Hilty) Inject 142 mg into the skin once. For investigational use only - Inject 1 mL (1 prefilled syringe) subcutaneously into appropriate injection site per protocol. (Approved injection site(s): upper arm, upper thigh, abdomen). Please contact [facility] if any questions.    Jessica Nose, MD  calcium carbonate (OS-CAL) 1250 (500 Ca) MG chewable tablet Chew 1 tablet (1,250 mg total) by mouth daily. 09/11/17 09/01/20  Jessica Grout, MD    Physical Exam    Vital Signs:  Jessica [name] does not have vital signs available for review today.  Given telephonic nature of communication, physical exam is limited. AAOx3. NAD. Normal affect.  Speech and respirations are unlabored.  Accessory Clinical Findings    None  Assessment & Plan    1.  Preoperative Cardiovascular Risk  Assessment:  According to the Revised Cardiac Risk Index (RCRI), her Perioperative Risk of Major Cardiac Event is (%): 0.4  Her Functional Capacity in METs is: 8.23 according to the Jessica Activity Status Index (DASI).   Per office protocol, if patient is without any new symptoms or concerns at the time of their virtual visit, she may hold Plavix for 5 days prior to procedure. Please resume Plavix ASAP as soon as possible postprocedure, at the discretion of the surgeon.    The patient was advised that if she develops new symptoms prior to surgery to contact our office to arrange for a follow-up visit, and she verbalized understanding.  Therefore, based on ACC/AHA guidelines, patient would be at acceptable risk for the planned procedure without further cardiovascular testing. I will route this recommendation to the requesting party via Epic fax function.     Time:   Today, I have spent 10 minutes with the patient with telehealth technology discussing  medical history, symptoms, and management plan.     Joni Reining, NP  10/13/2022, 9:43 AM

## 2022-10-13 ENCOUNTER — Ambulatory Visit: Payer: Medicare Other | Attending: Internal Medicine

## 2022-10-13 ENCOUNTER — Ambulatory Visit: Payer: Medicare Other

## 2022-10-13 DIAGNOSIS — Z01818 Encounter for other preprocedural examination: Secondary | ICD-10-CM

## 2022-10-13 DIAGNOSIS — Z01810 Encounter for preprocedural cardiovascular examination: Secondary | ICD-10-CM

## 2022-10-17 ENCOUNTER — Ambulatory Visit: Payer: Medicare Other | Attending: Surgery | Admitting: Physical Therapy

## 2022-10-17 ENCOUNTER — Ambulatory Visit: Payer: Medicare Other | Admitting: Physical Therapy

## 2022-10-17 ENCOUNTER — Other Ambulatory Visit: Payer: Self-pay

## 2022-10-17 DIAGNOSIS — Z171 Estrogen receptor negative status [ER-]: Secondary | ICD-10-CM | POA: Insufficient documentation

## 2022-10-17 DIAGNOSIS — C50412 Malignant neoplasm of upper-outer quadrant of left female breast: Secondary | ICD-10-CM | POA: Insufficient documentation

## 2022-10-17 DIAGNOSIS — R293 Abnormal posture: Secondary | ICD-10-CM | POA: Diagnosis not present

## 2022-10-17 NOTE — H&P (Signed)
History of Present Illness: Jessica [name] is a 82 y.o. female who is seen today as an office consultation for evaluation of Left Breast Cancer  Patient presents for evaluation of newly diagnosed left breast cancer. She was noted on recent mammogram to have a ill-defined density left breast upper outer quadrant. MRI showed 2.2 cm lesion left breast upper outer quadrant. This was also seen on mammogram. 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 Denies any history of breast pain, breast mass or nipple discharge.  Review of Systems: A complete review of systems was obtained from the patient. I have reviewed this information and discussed as appropriate with the patient. See HPI as well for other ROS.    Medical History: Past Medical History:  Diagnosis Date  Anemia  Chronic kidney disease  History of cancer  Hyperlipidemia  NSTEMI (non-ST elevated myocardial infarction) (CMS/HHS-HCC)  Thyroid disease   There is no problem list on file for this patient.  Past Surgical History:  Procedure Laterality Date  RIGHT OPEN REDUCTION INTERNAL FIXATION (ORIF) DISTAL HUMERUS FRACTURE WITH EXTENSION 04/10/2019  Dr. Osa Craver    Allergies  Allergen Reactions  Atorvastatin Unknown  Muscle weakness   Current Outpatient Medications on File Prior to Visit  Medication Sig Dispense Refill  clobetasoL (TEMOVATE) 0.05 % ointment Apply 1 Application topically as needed  clopidogreL (PLAVIX) 75 mg tablet Take 1 tablet by mouth once daily  gabapentin (NEURONTIN) 100 MG capsule Take 100 mg by mouth once daily  levothyroxine (SYNTHROID) 112 MCG tablet Take 112 mcg by mouth every morning before breakfast (0630)  nitroGLYcerin (NITROSTAT) 0.4 MG SL tablet TAKE 1 TABLET UNDER THE TONGUE EVERY 5 MINUTES AS NEEDED FOR CHEST PAIN  rosuvastatin (CRESTOR) 20 MG tablet Take 1 tablet by mouth once daily  denosumab (PROLIA) 60 mg/mL inj syringe  Inject subcutaneously   No current facility-administered medications on file prior to visit.   Family History  Problem Relation Age of Onset  Asthma Mother  Diabetes Mother  Stroke Father  Thyroid disease Sister  Cervical cancer Sister  Diabetes Maternal Grandmother    Social History   Tobacco Use  Smoking Status Never  Smokeless Tobacco Never    Social History   Socioeconomic History  Marital status: Divorced  Tobacco Use  Smoking status: Never  Smokeless tobacco: Never  Substance and Sexual Activity  Alcohol use: Yes  Alcohol/week: 2.0 - 3.0 standard drinks of alcohol  Types: 2 - 3 Standard drinks or equivalent per week  Drug use: Never   Social Determinants of Health   Financial Resource Strain: Low Risk (07/11/2022)  Received from [facility]  Overall Financial Resource Strain (CARDIA)  Difficulty of Paying Living Expenses: Not hard at all  Food Insecurity: No Food Insecurity (07/11/2022)  Received from [facility]  Hunger Vital Sign  Worried About Running Out of Food in the Last Year: Never true  Ran Out of Food in the Last Year: Never true  Transportation Needs: No Transportation Needs (03/29/2017)  Received from [facility] - Transportation  Lack of Transportation (Medical): No  Lack of Transportation (Non-Medical): No  Physical Activity: Insufficiently Active (07/11/2022)  Received from [facility]  Exercise Vital Sign  Days of Exercise per Week: 3 days  Minutes of Exercise per Session: 30 min  Stress: No Stress Concern Present (07/11/2022)  Received from [facility] - Occupational Stress Questionnaire  Feeling  of Stress : Only a little  Social Connections: Moderately Integrated (07/11/2022)  Received from [facility]  Social Connection and Isolation Panel [NHANES]  Frequency of Communication with Friends and Family: Once a week  Frequency of Social Gatherings with Friends and Family: Three times a week   Attends Religious Services: More than 4 times per year  Active Member of Clubs or Organizations: Yes  Attends Banker Meetings: Never  Marital Status: Widowed   Objective:   Vitals:  09/30/22 0943  Pulse: 69  Temp: 36.5 C (97.7 F)  SpO2: 98%  Weight: 71.8 kg (158 lb 3.2 oz)  Height: 160 cm (5\' 3" )  PainSc: 0-No pain   Body mass index is 28.02 kg/m.  Physical Exam Exam conducted with a chaperone present.  Cardiovascular:  Rate and Rhythm: Normal rate.  Pulmonary:  Effort: Pulmonary effort is normal.  Chest:  Breasts: Right: Normal. No swelling or mass.  Left: Normal. No swelling or mass.  Abdominal:  Tenderness: There is no abdominal tenderness.  Hernia: A hernia is present. Hernia is present in the umbilical area.  Musculoskeletal:  Cervical back: Normal range of motion.  Lymphadenopathy:  Upper Body:  Right upper body: No supraclavicular or axillary adenopathy.  Left upper body: No supraclavicular or axillary adenopathy.  Neurological:  Mental Status: She is alert.  Psychiatric:  Mood and Affect: Mood normal.     Labs, Imaging and Diagnostic Testing:  CLINICAL DATA: 82 year old female presents for further evaluation of newly diagnosed LEFT breast invasive lobular carcinoma.  EXAM: BILATERAL BREAST MRI WITH AND WITHOUT CONTRAST  TECHNIQUE: Multiplanar, multisequence MR images of both breasts were obtained prior to and following the intravenous administration of 7 ml of Vueway  Three-dimensional MR images were rendered by post-processing of the original MR data on an independent workstation. The three-dimensional MR images were interpreted, and findings are reported in the following complete MRI report for this study. Three dimensional images were evaluated at the independent interpreting workstation using the DynaCAD thin client.  COMPARISON: None available.  FINDINGS: Breast composition: b. Scattered fibroglandular  tissue.  Background parenchymal enhancement: Mild  Right breast: 1.2 cm linear enhancement within the slightly OUTER central RIGHT breast (image 101: Series 12) is indeterminate.  No other suspicious mass or worrisome enhancement is identified within the RIGHT breast.  Left breast: A 2.5 x 2.3 x 2.2 cm (AP x transverse x CC) irregular mass within the posterior slightly OUTER LEFT breast contains biopsy clip artifact and compatible with biopsy-proven malignancy. There is ill-defined and nodular non masslike enhancement extending ANTERIOR, LATERAL and INFERIOR to this biopsy-proven malignancy with the entire area measuring 8 x 5.9 x 3.5 cm.  No suspicious abnormalities are identified within the UPPER LEFT breast.  Lymph nodes: No abnormal appearing lymph nodes.  Ancillary findings: None.  IMPRESSION: 1. 2.5 cm biopsy-proven malignancy within the posterior slightly OUTER LEFT breast. However ill-defined and nodular non masslike enhancement extending ANTERIOR, LATERAL and INFERIOR to the biopsy-proven malignancy is worrisome for additional malignancy, with the entire area measuring 8 x 5.9 x 3.5 cm (AP x transverse x CC). Consider MR guided biopsies of the ANTERIOR and INFERIOR aspects of this non masslike enhancement if this would change management of the LEFT side. 2. Indeterminate 1.2 cm linear enhancement within the slightly OUTER central RIGHT breast. MR guided biopsy is recommended. 3. No abnormal appearing lymph nodes.  RECOMMENDATION: MR guided biopsy of the RIGHT breast.  Recommend 2 site MR guided biopsy of the LEFT  breast if this would change management on the LEFT side.  BI-RADS CATEGORY 4: Suspicious.   Electronically Signed By: Harmon Pier M.D. On: 09/27/2022 09:36   Diagnosis Breast, left, needle core biopsy, 3 o'clock, heart shaped clip - INVASIVE LOBULAR CARCINOMA, SEE NOTE - LOBULAR CARCINOMA IN SITU - TUBULE FORMATION: SCORE 3 - NUCLEAR  PLEOMORPHISM: SCORE 2 - MITOTIC COUNT: SCORE 1 - TOTAL SCORE: 6 - OVERALL GRADE: 2 - LYMPHOVASCULAR INVASION: NOT IDENTIFIED - CANCER LENGTH: 0.9 CM - CALCIFICATIONS: NOT IDENTIFIED NOTE: DR. Kenyon Ana REVIEWED THE CASE AND CONCURS WITH THE INTERPRETATION. TUMOR CELLS ARE POSITIVE FOR 1 of 3 FINAL for Heckman, Azaria R [phone]) Diagnosis(continued) CK7 AND GATA3, AND NEGATIVE FOR CD68. IMMUNOHISTOCHEMICAL STAIN E-CADHERIN SHOWS LOSS OF STAINING, CONSISTENT WITH A LOBULAR PHENOTYPE. A BREAST PROGNOSTIC PROFILE (ER, PR, KI-67 AND HER2) IS PENDING AND WILL BE REPORTED IN AN ADDENDUM. [facility] [place] WAS NOTIFIED ON 09/15/2022. Clifton James M.D. Pathologist, Electronic Signature (Case signed 09/15/2022) Specimen Gross and Clinical Information Specimen Comment TIF: 1 PM; CIT less than 30 seconds; mass Specimen(s) Obtained: Breast, left, needle core biopsy, 3 o'clock, heart shaped clip Specimen Clinical Information Expect CA, less likely atypical infection/inflammation such as granulomatous mastitis or fibrosis Gross Received in formalin labeled with the patient's name and "left breast" are 4 cores of fibroadipose tissue ranging from 1.5-1.8 centimeters in length, each measuring 0.1 cm in diameter. The specimen is entirely submitted in 1 block. TIF 1 p.m., CIT less than 30 seconds (KW, 09/13/2022) Stain(s) used in Diagnosis: The following stain(s) were used in diagnosing the case: E-CAD, CK-7, CD 68, GATA-3. The control(s) stained appropriately. ADDITIONAL INFORMATION: Breast, left, needle core biopsy, 3 o'clock, heart shaped clip PROGNOSTIC INDICATORS Results: IMMUNOHISTOCHEMICAL AND MORPHOMETRIC ANALYSIS PERFORMED MANUALLY The tumor cells are negative for Her2 (0). Estrogen Receptor: 0%, NEGATIVE Progesterone Receptor: 0%, NEGATIVE Proliferation Marker Ki67: 5% COMMENT: The negative hormone receptor study(ies) in this case has an internal positive control. 2  of 3 FINAL for Brasher, Tamma R [phone]) ADDITIONAL INFORMATION:(continued) REFERENCE RANGE ESTROGEN RECEPTOR NEGATIVE 0% POSITIVE =>1% REFERENCE RANGE PROGESTERONE RECEPTOR NEGATIVE 0% POSITIVE =>1% All controls stained appropriately Jimmy Picket MD Pathologist, Electronic Signature ( Signed 09/19/2022) Disclaimer  Assessment and Plan:   Diagnoses and all orders for this visit:  Stage 1 breast cancer, ER-, left (CMS/HHS-HCC) - Ambulatory Referral to Physical Therapy   Right breast biopsy pending  Patient desires left simple mastectomy without sentinel lymph node mapping. Risks and benefits reviewed procedure as well as discussion of reconstruction which she has no desire to proceed with. Plan will be for left simple mastectomy alone without sentinel lymph node mapping. Await right breast biopsy but this is felt to be benign more likely by appears. Risk of bleeding, infection, death, DVT, flap necrosis, wound complications, the use of drains, seroma, the need for the treatment standard procedures reviewed with the patient today.   Hayden Rasmussen, MD

## 2022-10-17 NOTE — Therapy (Signed)
OUTPATIENT PHYSICAL THERAPY BREAST CANCER BASELINE EVALUATION   Patient Name: Jessica [name] MRN: [id number] DOB:[birth year], 82 y.o., female Today's Date: 10/17/2022  END OF SESSION:  PT End of Session - 10/17/22 1245     Visit Number 1    Number of Visits 2    Date for PT Re-Evaluation 12/12/22    PT Start Time 1205    PT Stop Time 1246    PT Time Calculation (min) 41 min    Activity Tolerance Patient tolerated treatment well    Behavior During Therapy [facility] for tasks assessed/performed             Past Medical History:  Diagnosis Date   Anemia 12/2015   Bronchitis, chronic (HCC)    in her 42s   Cataract    Chronic kidney disease    in 20s had nephritis   Closed fracture of right distal humerus    right   Coronary artery disease    Hand, foot and mouth disease (HFMD)    Hearing loss 05/03/2016   High cholesterol    Hypothyroidism    Osteopenia    Pericarditis 01/1999   Pneumothorax 01/1999   Thyroid disorder    Vitamin B 12 deficiency    Past Surgical History:  Procedure Laterality Date   birthmark      removed,left leg   BREAST BIOPSY Left 09/13/2022   [place] LT BREAST BX W LOC DEV 1ST LESION IMG BX SPEC US GUIDE 09/13/2022 [facility]   CARDIAC CATHETERIZATION     CATARACT EXTRACTION  2017   CORONARY STENT INTERVENTION N/A 09/01/2020   Procedure: CORONARY STENT INTERVENTION;  Surgeon: Lyn Records, MD;  Location: [facility];  Service: Cardiovascular;  Laterality: N/A;   LEFT HEART CATH AND CORONARY ANGIOGRAPHY N/A 09/01/2020   Procedure: LEFT HEART CATH AND CORONARY ANGIOGRAPHY;  Surgeon: Lyn Records, MD;  Location: [facility];  Service: Cardiovascular;  Laterality: N/A;   ORIF HUMERUS FRACTURE Right 04/10/2019   Procedure: RIGHT OPEN REDUCTION INTERNAL FIXATION (ORIF) DISTAL HUMERUS FRACTURE WITH EXTENSION;  Surgeon: Bjorn Pippin, MD;  Location: [facility];  Service: Orthopedics;  Laterality: Right;   TONSILLECTOMY  AND ADENOIDECTOMY  1952?   Patient Active Problem List   Diagnosis Date Noted   Malignant neoplasm of upper-outer quadrant of left breast in female, estrogen receptor negative (HCC) 09/21/2022   Cancer determined by biopsy of breast (HCC) 09/12/2022   Atherosclerotic heart disease of native coronary artery with other forms of angina pectoris (HCC) 04/08/2022   Swelling of left foot 04/07/2022   Left hand fracture, sequela 04/07/2022   Depression with anxiety 04/07/2022   NSTEMI (non-ST elevated myocardial infarction) (HCC) 09/01/2020   Closed fracture of right distal humerus 04/10/2019   Dyspnea 05/09/2018   Vitamin B 12 deficiency 05/09/2018   Cough 03/06/2018   Senile osteoporosis 11/20/2017   Iron deficiency 04/26/2017   Lichen sclerosus 04/26/2017   External hemorrhoid 04/26/2017   Weakness of left leg 06/07/2016   Balance disorder 05/24/2016   Memory deficit 05/24/2016   Anemia 05/19/2016   Hearing loss 05/03/2016   Hypothyroidism 07/24/2013   Bradycardia on ECG 07/24/2013   Numbness 08/07/2012   HYPERLIPIDEMIA 12/29/2006    REFERRING PROVIDER: Dr. Harriette Bouillon  REFERRING DIAG: Left breast cancer  THERAPY DIAG:  Malignant neoplasm of upper-outer quadrant of left breast in female, estrogen receptor negative (HCC)  Abnormal posture  Rationale for Evaluation and Treatment: Rehabilitation  ONSET  DATE: 09/13/2022  SUBJECTIVE:                                                                                                                                                                                           SUBJECTIVE STATEMENT: Patient reports she is here today to be seen by her medical team for her newly diagnosed left breast cancer.   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 It measures 2.2 cm and is located in the upper outer quadrant. It is triple negative with a Ki67 of 5%. She has some  cardiac history and had an elbow fracture in 2022 resulting in surgery where pins were placed.  PATIENT GOALS:   reduce lymphedema risk and learn post op HEP.   PAIN:  Are you having pain? No except some mild left breast tingling  PRECAUTIONS: Active CA   RED FLAGS: None   HAND DOMINANCE: right  WEIGHT BEARING RESTRICTIONS: No  FALLS:  Has patient fallen in last 6 months? No  LIVING ENVIRONMENT: Patient lives [facility]: [facility] at [facility] Lives in: House/apartment Has following equipment at home: None  OCCUPATION: Retired  LEISURE: She walks 1/2 - 2 miles per day, does chair yoga once a week and participates in a swimming exercise class  PRIOR LEVEL OF FUNCTION: Independent   OBJECTIVE:  COGNITION: Overall cognitive status: Within functional limits for tasks assessed    POSTURE:  Forward head and rounded shoulders posture  UPPER EXTREMITY AROM/PROM:  A/PROM RIGHT   eval   Shoulder extension 63  Shoulder flexion 128  Shoulder abduction 137  Shoulder internal rotation 68  Shoulder external rotation 73    (Blank rows = not tested)  A/PROM LEFT   eval  Shoulder extension 53  Shoulder flexion 135  Shoulder abduction 146  Shoulder internal rotation 74  Shoulder external rotation 78    (Blank rows = not tested)  CERVICAL AROM: All within normal limits:    Percent limited  Flexion WNL  Extension 75% limited  Right lateral flexion 25% limited  Left lateral flexion 25% limited  Right rotation 25% limited  Left rotation 25% limited    UPPER EXTREMITY STRENGTH: WFL  LYMPHEDEMA ASSESSMENTS: NOT ASSESSED AS PT IS NOT HAVING ANY LYMPH NODES REMOVED  LANDMARK RIGHT   eval  10 cm proximal to olecranon process   Olecranon process   10 cm proximal to ulnar styloid process   Just proximal to ulnar styloid process   Across hand at thumb web space   At base of 2nd digit   (Blank rows = not  tested)  LANDMARK LEFT   eval  10 cm proximal to olecranon  process   Olecranon process   10 cm proximal to ulnar styloid process   Just proximal to ulnar styloid process   Across hand at thumb web space   At base of 2nd digit   (Blank rows = not tested)  L-DEX LYMPHEDEMA SCREENING:  The patient was assessed using the L-Dex machine today to produce a lymphedema index baseline score. The patient will be reassessed on a regular basis (typically every 3 months) to obtain new L-Dex scores. If the score is > 6.5 points away from his/her baseline score indicating onset of subclinical lymphedema, it will be recommended to wear a compression garment for 4 weeks, 12 hours per day and then be reassessed. If the score continues to be > 6.5 points from baseline at reassessment, we will initiate lymphedema treatment. Assessing in this manner has a 95% rate of preventing clinically significant lymphedema.    QUICK DASH SURVEY:  Neldon Mc - 10/17/22 0001     Open a tight or new jar Mild difficulty    Do heavy household chores (wash walls, wash floors) No difficulty    Carry a shopping bag or briefcase No difficulty    Wash your back No difficulty    Use a knife to cut food No difficulty    Recreational activities in which you take some force or impact through your arm, shoulder, or hand (golf, hammering, tennis) No difficulty    During the past week, to what extent has your arm, shoulder or hand problem interfered with your normal social activities with family, friends, neighbors, or groups? Not at all    During the past week, to what extent has your arm, shoulder or hand problem limited your work or other regular daily activities Not at all    Arm, shoulder, or hand pain. None    Tingling (pins and needles) in your arm, shoulder, or hand Mild    Difficulty Sleeping No difficulty    DASH Score 4.55 %              PATIENT EDUCATION:  Education details: Lymphedema risk reduction and post op shoulder/posture HEP Person educated: Patient Education  method: Explanation, Demonstration, Handout Education comprehension: Patient verbalized understanding and returned demonstration  HOME EXERCISE PROGRAM: Patient was instructed today in a home exercise program today for post op shoulder range of motion. These included active assist shoulder flexion in sitting, scapular retraction, wall walking with shoulder abduction, and hands behind head external rotation.  She was encouraged to do these twice a day, holding 3 seconds and repeating 5 times when permitted by her physician.   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 It measures 2.2 cm and is located in the upper outer quadrant. It is triple negative with a Ki67 of 5%. She has some cardiac history and had an elbow fracture in 2022 resulting in surgery where pins were placed.Her multidisciplinary medical team met prior to her assessments to determine a recommended treatment plan. She is planning to have a bilateral mastectomy on 10/18/2022 and then will repeat prognostics to determine next steps. She will benefit from a post op PT reassessment to determine needs and from L-Dex screens every 3 months for 2 years to detect subclinical lymphedema.  Pt will benefit from skilled therapeutic intervention to improve on the following deficits: Decreased knowledge of precautions, impaired UE functional  use, pain, decreased ROM, postural dysfunction.   PT treatment/interventions: ADL/self-care home management, pt/family education, therapeutic exercise  REHAB POTENTIAL: Excellent  CLINICAL DECISION MAKING: Stable/uncomplicated  EVALUATION COMPLEXITY: Low   GOALS: Goals reviewed with patient? YES  LONG TERM GOALS: (STG=LTG)    Name Target Date Goal status  1 Pt will be able to verbalize understanding of pertinent lymphedema risk reduction practices relevant to her dx specifically related to skin care.  Baseline:  No  knowledge 10/17/2022 Achieved at eval  2 Pt will be able to return demo and/or verbalize understanding of the post op HEP related to regaining shoulder ROM. Baseline:  No knowledge 10/17/2022 Achieved at eval  3 Pt will be able to verbalize understanding of the importance of attending the post op After Breast CA Class for further lymphedema risk reduction education and therapeutic exercise.  Baseline:  No knowledge 10/17/2022 Achieved at eval  4 Pt will demo she has regained full shoulder ROM and function post operatively compared to baselines.  Baseline: See objective measurements taken today. 12/12/2022     PLAN:  PT FREQUENCY/DURATION: EVAL and 1 follow up appointment.   PLAN FOR NEXT SESSION: will reassess 3-4 weeks post op to determine needs.   Patient will follow up at outpatient cancer rehab 3-4 weeks following surgery.  If the patient requires physical therapy at that time, a specific plan will be dictated and sent to the referring physician for approval. The patient was educated today on appropriate basic range of motion exercises to begin post operatively and the importance of attending the After Breast Cancer class following surgery.  Patient was educated today on lymphedema risk reduction practices as it pertains to recommendations that will benefit the patient immediately following surgery.  She verbalized good understanding.    Physical Therapy Information for After Breast Cancer Surgery/Treatment:  Lymphedema is a swelling condition that you may be at risk for in your arm if you have lymph nodes removed from the armpit area.  After a sentinel node biopsy, the risk is approximately 5-9% and is higher after an axillary node dissection.  There is treatment available for this condition and it is not life-threatening.  Contact your physician or physical therapist with concerns. You may begin the 4 shoulder/posture exercises (see additional sheet) when permitted by your physician (typically  a week after surgery).  If you have drains, you may need to wait until those are removed before beginning range of motion exercises.  A general recommendation is to not lift your arms above shoulder height until drains are removed.  These exercises should be done to your tolerance and gently.  This is not a "no pain/no gain" type of recovery so listen to your body and stretch into the range of motion that you can tolerate, stopping if you have pain.  If you are having immediate reconstruction, ask your plastic surgeon about doing exercises as he or she may want you to wait. We encourage you to attend the free one time ABC (After Breast Cancer) class offered by [facility].  You will learn information related to lymphedema risk, prevention and treatment and additional exercises to regain mobility following surgery.  You can call [phone] for more information.  This is offered the 1st and 3rd Monday of each month.  You only attend the class one time. While undergoing any medical procedure or treatment, try to avoid blood pressure being taken or needle sticks from occurring on the arm on the side of  cancer.   This recommendation begins after surgery and continues for the rest of your life.  This may help reduce your risk of getting lymphedema (swelling in your arm). An excellent resource for those seeking information on lymphedema is the National Lymphedema Network's web site. It can be accessed at www.lymphnet.org If you notice swelling in your hand, arm or breast at any time following surgery (even if it is many years from now), please contact your doctor or physical therapist to discuss this.  Lymphedema can be treated at any time but it is easier for you if it is treated early on.  If you feel like your shoulder motion is not returning to normal in a reasonable amount of time, please contact your surgeon or physical therapist.  [facility] [phone].  [address], [place] [address]  ABC CLASS After Breast Cancer Class  After Breast Cancer Class is a specially designed exercise class to assist you in a safe recover after having breast cancer surgery.  In this class you will learn how to get back to full function whether your drains were just removed or if you had surgery a month ago.  This one-time class is held the 1st and 3rd Monday of every month from 11:00 a.m. until 12:00 noon virtually.  This class is FREE and space is limited. For more information or to register for the next available class, call [phone].  Class Goals  Understand specific stretches to improve the flexibility of you chest and shoulder. Learn ways to safely strengthen your upper body and improve your posture. Understand the warning signs of infection and why you may be at risk for an arm infection. Learn about Lymphedema and prevention.  ** You do not attend this class until after surgery.  Drains must be removed to participate  Patient was instructed today in a home exercise program today for post op shoulder range of motion. These included active assist shoulder flexion in sitting, scapular retraction, wall walking with shoulder abduction, and hands behind head external rotation.  She was encouraged to do these twice a day, holding 3 seconds and repeating 5 times when permitted by her physician.  Bethann Punches, [place] 10/17/22 2:55 PM

## 2022-10-18 ENCOUNTER — Encounter (HOSPITAL_BASED_OUTPATIENT_CLINIC_OR_DEPARTMENT_OTHER)
Admission: RE | Disposition: A | Discharge status: Home / Self Care | Payer: Self-pay | Source: Home / Self Care | Attending: Surgery

## 2022-10-18 ENCOUNTER — Other Ambulatory Visit: Payer: Self-pay

## 2022-10-18 ENCOUNTER — Ambulatory Visit (HOSPITAL_BASED_OUTPATIENT_CLINIC_OR_DEPARTMENT_OTHER): Payer: Medicare Other | Admitting: Anesthesiology

## 2022-10-18 ENCOUNTER — Ambulatory Visit (HOSPITAL_BASED_OUTPATIENT_CLINIC_OR_DEPARTMENT_OTHER)
Admission: RE | Admit: 2022-10-18 | Discharge: 2022-10-19 | Disposition: A | Discharge status: Home / Self Care | Payer: Medicare Other | Attending: Surgery | Admitting: Surgery

## 2022-10-18 ENCOUNTER — Encounter (HOSPITAL_BASED_OUTPATIENT_CLINIC_OR_DEPARTMENT_OTHER): Payer: Self-pay | Admitting: Surgery

## 2022-10-18 DIAGNOSIS — I252 Old myocardial infarction: Secondary | ICD-10-CM | POA: Diagnosis not present

## 2022-10-18 DIAGNOSIS — Z7902 Long term (current) use of antithrombotics/antiplatelets: Secondary | ICD-10-CM | POA: Insufficient documentation

## 2022-10-18 DIAGNOSIS — C50912 Malignant neoplasm of unspecified site of left female breast: Secondary | ICD-10-CM | POA: Diagnosis not present

## 2022-10-18 DIAGNOSIS — I214 Non-ST elevation (NSTEMI) myocardial infarction: Secondary | ICD-10-CM | POA: Diagnosis not present

## 2022-10-18 DIAGNOSIS — C50412 Malignant neoplasm of upper-outer quadrant of left female breast: Secondary | ICD-10-CM | POA: Insufficient documentation

## 2022-10-18 DIAGNOSIS — I25118 Atherosclerotic heart disease of native coronary artery with other forms of angina pectoris: Secondary | ICD-10-CM

## 2022-10-18 DIAGNOSIS — I2510 Atherosclerotic heart disease of native coronary artery without angina pectoris: Secondary | ICD-10-CM | POA: Insufficient documentation

## 2022-10-18 DIAGNOSIS — C50919 Malignant neoplasm of unspecified site of unspecified female breast: Secondary | ICD-10-CM

## 2022-10-18 DIAGNOSIS — D360 Benign neoplasm of lymph nodes: Secondary | ICD-10-CM | POA: Diagnosis not present

## 2022-10-18 DIAGNOSIS — Z171 Estrogen receptor negative status [ER-]: Secondary | ICD-10-CM | POA: Diagnosis not present

## 2022-10-18 DIAGNOSIS — Z955 Presence of coronary angioplasty implant and graft: Secondary | ICD-10-CM | POA: Diagnosis not present

## 2022-10-18 DIAGNOSIS — Z170 Estrogen receptor positive status [ER+]: Secondary | ICD-10-CM | POA: Diagnosis not present

## 2022-10-18 DIAGNOSIS — E785 Hyperlipidemia, unspecified: Secondary | ICD-10-CM | POA: Diagnosis not present

## 2022-10-18 DIAGNOSIS — G8918 Other acute postprocedural pain: Secondary | ICD-10-CM | POA: Diagnosis not present

## 2022-10-18 DIAGNOSIS — E039 Hypothyroidism, unspecified: Secondary | ICD-10-CM | POA: Diagnosis not present

## 2022-10-18 DIAGNOSIS — C50812 Malignant neoplasm of overlapping sites of left female breast: Secondary | ICD-10-CM | POA: Diagnosis not present

## 2022-10-18 DIAGNOSIS — N189 Chronic kidney disease, unspecified: Secondary | ICD-10-CM | POA: Diagnosis not present

## 2022-10-18 DIAGNOSIS — Z01818 Encounter for other preprocedural examination: Secondary | ICD-10-CM

## 2022-10-18 DIAGNOSIS — N6081 Other benign mammary dysplasias of right breast: Secondary | ICD-10-CM | POA: Diagnosis not present

## 2022-10-18 DIAGNOSIS — D241 Benign neoplasm of right breast: Secondary | ICD-10-CM | POA: Diagnosis not present

## 2022-10-18 HISTORY — PX: SIMPLE MASTECTOMY WITH AXILLARY SENTINEL NODE BIOPSY: SHX6098

## 2022-10-18 SURGERY — SIMPLE MASTECTOMY
Anesthesia: Regional | Site: Breast | Laterality: Bilateral

## 2022-10-18 MED ORDER — SODIUM CHLORIDE 0.9 % IV SOLN
INTRAVENOUS | Status: AC
  Filled 2022-10-18: qty 10

## 2022-10-18 MED ORDER — PHENYLEPHRINE HCL (PRESSORS) 10 MG/ML IV SOLN
INTRAVENOUS | Status: DC | PRN
Start: 2022-10-18 — End: 2022-10-18
  Administered 2022-10-18: 80 ug via INTRAVENOUS

## 2022-10-18 MED ORDER — GABAPENTIN 100 MG PO CAPS
ORAL_CAPSULE | ORAL | Status: AC
  Filled 2022-10-18: qty 1

## 2022-10-18 MED ORDER — SODIUM CHLORIDE 0.9% FLUSH: 3.0000 mL | INTRAVENOUS | Status: DC | PRN

## 2022-10-18 MED ORDER — DEXAMETHASONE SODIUM PHOSPHATE 10 MG/ML IJ SOLN
INTRAMUSCULAR | Status: DC | PRN
Start: 2022-10-18 — End: 2022-10-18
  Administered 2022-10-18 (×2): 5 mg

## 2022-10-18 MED ORDER — ACETAMINOPHEN 500 MG PO TABS
1000.0000 mg | ORAL_TABLET | Freq: Once | ORAL | Status: AC
  Administered 2022-10-18: 1000 mg via ORAL

## 2022-10-18 MED ORDER — ONDANSETRON HCL 4 MG/2ML IJ SOLN: 4.0000 mg | INTRAMUSCULAR | Status: DC | PRN

## 2022-10-18 MED ORDER — LEVOTHYROXINE SODIUM 112 MCG PO TABS
112.0000 ug | ORAL_TABLET | Freq: Every day | ORAL | Status: DC
  Filled 2022-10-18: qty 1

## 2022-10-18 MED ORDER — CEFAZOLIN SODIUM-DEXTROSE 2-4 GM/100ML-% IV SOLN
INTRAVENOUS | Status: AC
  Filled 2022-10-18: qty 100

## 2022-10-18 MED ORDER — OXYCODONE HCL 5 MG PO TABS: 5.0000 mg | ORAL_TABLET | Freq: Four times a day (QID) | ORAL | 0 refills | Status: DC | PRN

## 2022-10-18 MED ORDER — DEXAMETHASONE SODIUM PHOSPHATE 10 MG/ML IJ SOLN
INTRAMUSCULAR | Status: AC
  Filled 2022-10-18: qty 1

## 2022-10-18 MED ORDER — FENTANYL CITRATE (PF) 100 MCG/2ML IJ SOLN
100.0000 ug | Freq: Once | INTRAMUSCULAR | Status: AC
  Administered 2022-10-18: 100 ug via INTRAVENOUS

## 2022-10-18 MED ORDER — ACETAMINOPHEN 500 MG PO TABS
ORAL_TABLET | ORAL | Status: AC
  Filled 2022-10-18: qty 2

## 2022-10-18 MED ORDER — FENTANYL CITRATE (PF) 100 MCG/2ML IJ SOLN
INTRAMUSCULAR | Status: DC | PRN
  Administered 2022-10-18 (×2): 50 ug via INTRAVENOUS

## 2022-10-18 MED ORDER — LORATADINE 10 MG PO TABS: 10.0000 mg | ORAL_TABLET | ORAL | Status: DC | PRN

## 2022-10-18 MED ORDER — ACETAMINOPHEN 325 MG PO TABS
ORAL_TABLET | ORAL | Status: AC
  Filled 2022-10-18: qty 2

## 2022-10-18 MED ORDER — OXYCODONE HCL 5 MG PO TABS: 5.0000 mg | ORAL_TABLET | ORAL | Status: DC | PRN

## 2022-10-18 MED ORDER — GABAPENTIN 100 MG PO CAPS
100.0000 mg | ORAL_CAPSULE | Freq: Every day | ORAL | Status: DC
  Administered 2022-10-18: 100 mg via ORAL

## 2022-10-18 MED ORDER — METHOCARBAMOL 500 MG PO TABS
500.0000 mg | ORAL_TABLET | Freq: Four times a day (QID) | ORAL | Status: DC | PRN
  Administered 2022-10-18: 500 mg via ORAL
  Filled 2022-10-18: qty 1

## 2022-10-18 MED ORDER — TRANEXAMIC ACID 1000 MG/10ML IV SOLN
Status: DC | PRN
  Administered 2022-10-18: 3000 mg via TOPICAL

## 2022-10-18 MED ORDER — LACTATED RINGERS IV SOLN: INTRAVENOUS | Status: DC

## 2022-10-18 MED ORDER — ROPIVACAINE HCL 5 MG/ML IJ SOLN
INTRAMUSCULAR | Status: DC | PRN
  Administered 2022-10-18 (×2): 20 mL via PERINEURAL

## 2022-10-18 MED ORDER — FENTANYL CITRATE (PF) 100 MCG/2ML IJ SOLN
INTRAMUSCULAR | Status: AC
  Filled 2022-10-18: qty 2

## 2022-10-18 MED ORDER — ONDANSETRON HCL 4 MG/2ML IJ SOLN
INTRAMUSCULAR | Status: DC | PRN
  Administered 2022-10-18: 4 mg via INTRAVENOUS

## 2022-10-18 MED ORDER — HEMOSTATIC AGENTS (NO CHARGE) OPTIME
TOPICAL | Status: DC | PRN
  Administered 2022-10-18: 1 via TOPICAL

## 2022-10-18 MED ORDER — EPHEDRINE SULFATE (PRESSORS) 50 MG/ML IJ SOLN
INTRAMUSCULAR | Status: DC | PRN
  Administered 2022-10-18: 5 mg via INTRAVENOUS
  Administered 2022-10-18: 10 mg via INTRAVENOUS

## 2022-10-18 MED ORDER — ONDANSETRON 4 MG PO TBDP: 4.0000 mg | ORAL_TABLET | ORAL | Status: DC | PRN

## 2022-10-18 MED ORDER — LEVOTHYROXINE SODIUM 112 MCG PO TABS
112.0000 ug | ORAL_TABLET | Freq: Every day | ORAL | Status: DC
  Administered 2022-10-19: 112 ug via ORAL
  Filled 2022-10-18: qty 1

## 2022-10-18 MED ORDER — ACETAMINOPHEN 325 MG PO TABS
650.0000 mg | ORAL_TABLET | ORAL | Status: DC | PRN
  Administered 2022-10-18 (×2): 650 mg via ORAL
  Filled 2022-10-18: qty 2

## 2022-10-18 MED ORDER — PROPOFOL 10 MG/ML IV BOLUS
INTRAVENOUS | Status: DC | PRN
Start: 2022-10-18 — End: 2022-10-18
  Administered 2022-10-18: 70 mg via INTRAVENOUS

## 2022-10-18 MED ORDER — FENTANYL CITRATE (PF) 100 MCG/2ML IJ SOLN: 25.0000 ug | INTRAMUSCULAR | Status: DC | PRN

## 2022-10-18 MED ORDER — DEXAMETHASONE SODIUM PHOSPHATE 4 MG/ML IJ SOLN
INTRAMUSCULAR | Status: DC | PRN
  Administered 2022-10-18: 4 mg via INTRAVENOUS

## 2022-10-18 MED ORDER — SODIUM CHLORIDE 0.9% FLUSH
3.0000 mL | Freq: Two times a day (BID) | INTRAVENOUS | Status: DC
  Administered 2022-10-18: 3 mL via INTRAVENOUS

## 2022-10-18 MED ORDER — TRANEXAMIC ACID 1000 MG/10ML IV SOLN
INTRAVENOUS | Status: AC
  Filled 2022-10-18: qty 30

## 2022-10-18 MED ORDER — ROSUVASTATIN CALCIUM 20 MG PO TABS
20.0000 mg | ORAL_TABLET | Freq: Every day | ORAL | Status: DC
  Filled 2022-10-18: qty 1

## 2022-10-18 MED ORDER — PROPOFOL 10 MG/ML IV BOLUS
INTRAVENOUS | Status: AC
  Filled 2022-10-18: qty 20

## 2022-10-18 MED ORDER — ONDANSETRON HCL 4 MG/2ML IJ SOLN
INTRAMUSCULAR | Status: AC
  Filled 2022-10-18: qty 2

## 2022-10-18 MED ORDER — CEFAZOLIN IN SODIUM CHLORIDE 3-0.9 GM/100ML-% IV SOLN: 3.0000 g | INTRAVENOUS | Status: DC

## 2022-10-18 MED ORDER — MELATONIN 5 MG PO TABS
5.0000 mg | ORAL_TABLET | Freq: Every day | ORAL | Status: DC
  Administered 2022-10-18: 5 mg via ORAL
  Filled 2022-10-18: qty 1

## 2022-10-18 MED ORDER — CEFAZOLIN SODIUM-DEXTROSE 2-4 GM/100ML-% IV SOLN
2.0000 g | INTRAVENOUS | Status: AC
  Administered 2022-10-18: 2 g via INTRAVENOUS

## 2022-10-18 MED ORDER — ACETAMINOPHEN 325 MG RE SUPP: 650.0000 mg | RECTAL | Status: DC | PRN

## 2022-10-18 MED ORDER — SUGAMMADEX SODIUM 200 MG/2ML IV SOLN
INTRAVENOUS | Status: DC | PRN
Start: 2022-10-18 — End: 2022-10-18
  Administered 2022-10-18: 200 mg via INTRAVENOUS

## 2022-10-18 MED ORDER — SODIUM CHLORIDE 0.9 % IV SOLN: 250.0000 mL | INTRAVENOUS | Status: DC | PRN

## 2022-10-18 MED ORDER — SODIUM CHLORIDE 0.9 % IV SOLN
INTRAVENOUS | Status: DC | PRN
  Administered 2022-10-18: 500 mL

## 2022-10-18 MED ORDER — LIDOCAINE 2% (20 MG/ML) 5 ML SYRINGE
INTRAMUSCULAR | Status: AC
  Filled 2022-10-18: qty 5

## 2022-10-18 MED ORDER — ROCURONIUM BROMIDE 100 MG/10ML IV SOLN
INTRAVENOUS | Status: DC | PRN
  Administered 2022-10-18: 50 mg via INTRAVENOUS

## 2022-10-18 SURGICAL SUPPLY — 74 items
ADH SKN CLS APL DERMABOND .7 (GAUZE/BANDAGES/DRESSINGS) ×2
APL PRP STRL LF DISP 70% ISPRP (MISCELLANEOUS) ×1
APL SKNCLS STERI-STRIP NONHPOA (GAUZE/BANDAGES/DRESSINGS)
APPLIER CLIP 11 MED OPEN (CLIP)
APPLIER CLIP 9.375 MED OPEN (MISCELLANEOUS) ×2
APR CLP MED 11 20 MLT OPN (CLIP)
APR CLP MED 9.3 20 MLT OPN (MISCELLANEOUS) ×2
BAG DECANTER FOR FLEXI CONT (MISCELLANEOUS) IMPLANT
BENZOIN TINCTURE PRP APPL 2/3 (GAUZE/BANDAGES/DRESSINGS) IMPLANT
BINDER BREAST LRG (GAUZE/BANDAGES/DRESSINGS) IMPLANT
BINDER BREAST MEDIUM (GAUZE/BANDAGES/DRESSINGS) IMPLANT
BINDER BREAST XLRG (GAUZE/BANDAGES/DRESSINGS) IMPLANT
BINDER BREAST XXLRG (GAUZE/BANDAGES/DRESSINGS) IMPLANT
BIOPATCH RED 1 DISK 7.0 (GAUZE/BANDAGES/DRESSINGS) IMPLANT
BLADE HEX COATED 2.75 (ELECTRODE) ×1 IMPLANT
BLADE SURG 10 STRL SS (BLADE) ×1 IMPLANT
BLADE SURG 15 STRL LF DISP TIS (BLADE) ×1 IMPLANT
BLADE SURG 15 STRL SS (BLADE) ×1
BNDG CMPR 6 X 5 YARDS HK CLSR (GAUZE/BANDAGES/DRESSINGS) ×1
BNDG ELASTIC 6INX 5YD STR LF (GAUZE/BANDAGES/DRESSINGS) ×1 IMPLANT
CANISTER SUCT 1200ML W/VALVE (MISCELLANEOUS) ×1 IMPLANT
CHLORAPREP W/TINT 26 (MISCELLANEOUS) ×1 IMPLANT
CLIP APPLIE 11 MED OPEN (CLIP) IMPLANT
CLIP APPLIE 9.375 MED OPEN (MISCELLANEOUS) IMPLANT
COVER BACK TABLE 60X90IN (DRAPES) ×1 IMPLANT
COVER MAYO STAND STRL (DRAPES) ×1 IMPLANT
COVER PROBE CYLINDRICAL 5X96 (MISCELLANEOUS) ×1 IMPLANT
DERMABOND ADVANCED .7 DNX12 (GAUZE/BANDAGES/DRESSINGS) ×2 IMPLANT
DRAIN CHANNEL 19F RND (DRAIN) ×1 IMPLANT
DRAIN RELI 100 BL SUC LF ST (DRAIN) ×1
DRAPE LAPAROSCOPIC ABDOMINAL (DRAPES) ×1 IMPLANT
DRAPE UTILITY XL STRL (DRAPES) ×1 IMPLANT
DRSG TEGADERM 2-3/8X2-3/4 SM (GAUZE/BANDAGES/DRESSINGS) IMPLANT
DRSG TEGADERM 4X10 (GAUZE/BANDAGES/DRESSINGS) ×1 IMPLANT
ELECT COATED BLADE 2.86 ST (ELECTRODE) ×1 IMPLANT
ELECT REM PT RETURN 9FT ADLT (ELECTROSURGICAL) ×1
ELECTRODE REM PT RTRN 9FT ADLT (ELECTROSURGICAL) ×1 IMPLANT
EVACUATOR SILICONE 100CC (DRAIN) ×1 IMPLANT
GAUZE PAD ABD 8X10 STRL (GAUZE/BANDAGES/DRESSINGS) IMPLANT
GAUZE SPONGE 4X4 12PLY STRL LF (GAUZE/BANDAGES/DRESSINGS) IMPLANT
GLOVE BIOGEL PI IND STRL 8 (GLOVE) ×1 IMPLANT
GLOVE ECLIPSE 8.0 STRL XLNG CF (GLOVE) ×1 IMPLANT
GOWN STRL REUS W/ TWL LRG LVL3 (GOWN DISPOSABLE) ×2 IMPLANT
GOWN STRL REUS W/ TWL XL LVL3 (GOWN DISPOSABLE) ×1 IMPLANT
GOWN STRL REUS W/TWL LRG LVL3 (GOWN DISPOSABLE) ×2
GOWN STRL REUS W/TWL XL LVL3 (GOWN DISPOSABLE) ×1
HEMOSTAT ARISTA ABSORB 3G PWDR (HEMOSTASIS) IMPLANT
NDL HYPO 25X1 1.5 SAFETY (NEEDLE) ×2 IMPLANT
NDL SAFETY ECLIP 18X1.5 (MISCELLANEOUS) ×1 IMPLANT
NEEDLE HYPO 25X1 1.5 SAFETY (NEEDLE) ×2
NS IRRIG 1000ML POUR BTL (IV SOLUTION) IMPLANT
PACK BASIN DAY SURGERY FS (CUSTOM PROCEDURE TRAY) ×1 IMPLANT
PENCIL SMOKE EVACUATOR (MISCELLANEOUS) ×1 IMPLANT
PIN SAFETY STERILE (MISCELLANEOUS) ×1 IMPLANT
SLEEVE SCD COMPRESS KNEE MED (STOCKING) ×1 IMPLANT
SPIKE FLUID TRANSFER (MISCELLANEOUS) IMPLANT
SPONGE T-LAP 18X18 ~~LOC~~+RFID (SPONGE) ×1 IMPLANT
SPONGE T-LAP 4X18 ~~LOC~~+RFID (SPONGE) IMPLANT
STAPLER VISISTAT 35W (STAPLE) ×1 IMPLANT
STRIP CLOSURE SKIN 1/2X4 (GAUZE/BANDAGES/DRESSINGS) IMPLANT
SUT CHROMIC 3 0 SH 27 (SUTURE) IMPLANT
SUT ETHILON 2 0 FS 18 (SUTURE) ×1 IMPLANT
SUT MNCRL AB 3-0 PS2 18 (SUTURE) ×1 IMPLANT
SUT MON AB 4-0 PC3 18 (SUTURE) IMPLANT
SUT SILK 2 0 PERMA HAND 18 BK (SUTURE) ×1 IMPLANT
SUT SILK 2 0 SH (SUTURE) IMPLANT
SUT VIC AB 3-0 54X BRD REEL (SUTURE) ×1 IMPLANT
SUT VIC AB 3-0 BRD 54 (SUTURE) ×1
SUT VICRYL 3-0 CR8 SH (SUTURE) ×2 IMPLANT
SYR CONTROL 10ML LL (SYRINGE) ×2 IMPLANT
TOWEL GREEN STERILE FF (TOWEL DISPOSABLE) ×2 IMPLANT
TRAY FAXITRON CT DISP (TRAY / TRAY PROCEDURE) ×1 IMPLANT
TUBE CONNECTING 20X1/4 (TUBING) ×1 IMPLANT
YANKAUER SUCT BULB TIP NO VENT (SUCTIONS) ×1 IMPLANT

## 2022-10-18 NOTE — Anesthesia Procedure Notes (Signed)
Procedure Name: Intubation Date/Time: 10/18/2022 1:40 PM  Performed by: Burna Cash, CRNAPre-anesthesia Checklist: Patient identified, Emergency Drugs available, Suction available and Patient being monitored Patient Re-evaluated:Patient Re-evaluated prior to induction Oxygen Delivery Method: Circle system utilized Preoxygenation: Pre-oxygenation with 100% oxygen Induction Type: IV induction Ventilation: Mask ventilation without difficulty Grade View: Grade III Tube type: Oral Tube size: 7.0 mm Number of attempts: 2 Airway Equipment and Method: Stylet, Oral airway and Video-laryngoscopy Placement Confirmation: ETT inserted through vocal cords under direct vision, positive ETCO2 and breath sounds checked- equal and bilateral Secured at: 20 cm Tube secured with: Tape Dental Injury: Teeth and Oropharynx as per pre-operative assessment  Difficulty Due To: Difficult Airway- due to anterior larynx Comments: Attempt X 1 with MAC 3, poor vusualization. Successful with Glidescope #3 on second attempt. Easy FMV.

## 2022-10-18 NOTE — Discharge Instructions (Addendum)
[facility] [phone]  MASTECTOMY: POST OP INSTRUCTIONS  Always review your discharge instruction sheet given to you by the facility where your surgery was performed. IF YOU HAVE DISABILITY OR FAMILY LEAVE FORMS, YOU MUST BRING THEM TO THE OFFICE FOR PROCESSING.   DO NOT GIVE THEM TO YOUR DOCTOR. A prescription for pain medication may be given to you upon discharge.  Take your pain medication as prescribed, if needed.  If narcotic pain medicine is not needed, then you may take acetaminophen (Tylenol) or ibuprofen (Advil) as needed. Take your usually prescribed medications unless otherwise directed. If you need a refill on your pain medication, please contact your pharmacy.  They will contact our office to request authorization.  Prescriptions will not be filled after 5pm or on week-ends. You should follow a light diet the first few days after arrival home, such as soup and crackers, etc.  Resume your normal diet the day after surgery. Most patients will experience some swelling and bruising on the chest and underarm.  Ice packs will help.  Swelling and bruising can take several days to resolve.  It is common to experience some constipation if taking pain medication after surgery.  Increasing fluid intake and taking a stool softener (such as Colace) will usually help or prevent this problem from occurring.  A mild laxative (Milk of Magnesia or Miralax) should be taken according to package instructions if there are no bowel movements after 48 hours. Unless discharge instructions indicate otherwise, leave your bandage dry and in place until your next appointment in 3-5 days.  You may take a limited sponge bath.  No tube baths or showers until the drains are removed.  You may have steri-strips (small skin tapes) in place directly over the incision.  These strips should be left on the skin for 7-10 days.  If your surgeon used skin glue on the incision, you may shower in 24 hours.   The glue will flake off over the next 2-3 weeks.  Any sutures or staples will be removed at the office during your follow-up visit. DRAINS:  If you have drains in place, it is important to keep a list of the amount of drainage produced each day in your drains.  Before leaving the hospital, you should be instructed on drain care.  Call our office if you have any questions about your drains. ACTIVITIES:  You may resume regular (light) daily activities beginning the next day--such as daily self-care, walking, climbing stairs--gradually increasing activities as tolerated.  You may have sexual intercourse when it is comfortable.  Refrain from any heavy lifting or straining until approved by your doctor. You may drive when you are no longer taking prescription pain medication, you can comfortably wear a seatbelt, and you can safely maneuver your car and apply brakes. RETURN TO WORK:  __________________________________________________________ Bonita Quin should see your doctor in the office for a follow-up appointment approximately 3-5 days after your surgery.  Your doctor's nurse will typically make your follow-up appointment when she calls you with your pathology report.  Expect your pathology report 2-3 business days after your surgery.  You may call to check if you do not hear from [place] after three days.   OTHER INSTRUCTIONS: ______________________________________________________________________________________________ ____________________________________________________________________________________________ WHEN TO CALL YOUR DOCTOR: Fever over 101.0 Nausea and/or vomiting Extreme swelling or bruising Continued bleeding from incision. Increased pain, redness, or drainage from the incision. The clinic staff is available to answer your questions during regular business hours.  Please don't hesitate  to call and ask to speak to one of the nurses for clinical concerns.  If you have a medical emergency, go to the  nearest emergency room or call 911.  A surgeon from [facility] is always on call at the hospital. [address], [address], [place], [place]  [address] ? [address] [address], [place], [place]   [address] [phone] ? [phone] ? FAX [phone] Web site: [email or web address]     Post Anesthesia Home Care Instructions  Activity: Get plenty of rest for the remainder of the day. A responsible individual must stay with you for 24 hours following the procedure.  For the next 24 hours, DO NOT: -Drive a car -Advertising copywriter -Drink alcoholic beverages -Take any medication unless instructed by your physician -Make any legal decisions or sign important papers.  Meals: Start with liquid foods such as gelatin or soup. Progress to regular foods as tolerated. Avoid greasy, spicy, heavy foods. If nausea and/or vomiting occur, drink only clear liquids until the nausea and/or vomiting subsides. Call your physician if vomiting continues.  Special Instructions/Symptoms: Your throat may feel dry or sore from the anesthesia or the breathing tube placed in your throat during surgery. If this causes discomfort, gargle with warm salt water. The discomfort should disappear within 24 hours.  If you had a scopolamine patch placed behind your ear for the management of post- operative nausea and/or vomiting:  1. The medication in the patch is effective for 72 hours, after which it should be removed.  Wrap patch in a tissue and discard in the trash. Wash hands thoroughly with soap and water. 2. You may remove the patch earlier than 72 hours if you experience unpleasant side effects which may include dry mouth, dizziness or visual disturbances. 3. Avoid touching the patch. Wash your hands with soap and water after contact with the patch.       JP Drain [employer] this sheet to all of your post-operative appointments while you have your drains. Please measure your drains by CC's or ML's. Make sure  you drain and measure your JP Drains 2 or 3 times per day. At the end of each day, add up totals for the left side and add up totals for the right side.    ( 9 am )     ( 3 pm )        ( 9 pm )                Date L  R  L  R  L  R  Total L/R  [facility] [place] [phone]  MASTECTOMY: POST OP INSTRUCTIONS Take 400 mg of ibuprofen every 8 hours or 650 mg tylenol every 6 hours for next 72 hours then as needed. Use ice several times daily also. Always review your discharge instruction sheet given to you by the facility where your surgery was performed.  A prescription for pain medication may be given to you upon discharge.  Take your pain medication as prescribed, if needed.  If narcotic pain medicine is not needed, then you may take acetaminophen (Tylenol), naprosyn (Alleve) or ibuprofen (Advil) as needed. Take your usually prescribed medications unless otherwise directed. If you need a refill on your pain medication, please contact your pharmacy.  They will contact our office to request authorization.  Prescriptions will not be filled after 5pm or on week-ends. You should follow a light diet the first 24 hours after surgery.  Resume your normal diet the day after surgery. Most patients will experience some swelling and bruising on the chest and underarm.  Ice packs will help.  Swelling and bruising can take several days to resolve. Wear the binder or [place] bra for 72 hours day and night. After night please wear during the day.  It is common to experience some constipation if taking pain medication after surgery.  Increasing fluid intake and taking a stool softener (such as Colace) will usually help or prevent this problem from occurring.  A mild laxative (Milk of Magnesia or Miralax) should  be taken according to package instructions if there are no bowel movements after 48 hours. There is glue and steristrips on your incision. They will come off in the next few weeks.  You may take a shower 48 hours after surgery.  Any sutures will be removed at an office visit DRAINS:  If you have drains in place, it is important to keep a list of the amount of drainage produced each day in your drains.  Before leaving the hospital, you should be instructed on drain care.  Call our office if you have any questions about your drains. I will remove your drains when they put out less than 30 cc or ml for 2 consecutive days. ACTIVITIES:  You may resume regular (light) daily activities beginning the next day--such as daily self-care, walking, climbing stairs--gradually increasing activities as tolerated.  You may have sexual intercourse when it is comfortable.  Refrain from any heavy lifting or straining until approved by your doctor. You may drive when you are no longer taking prescription pain medication, you can comfortably wear a seatbelt, and you can safely maneuver your car and apply brakes. RETURN TO WORK:  __________________________________________________________ Bonita Quin should see your doctor in the office for a follow-up appointment approximately 3-5 days after your surgery.  Your doctor's nurse will typically make your follow-up appointment when she calls you with your pathology report.  Expect your pathology report 3-4business days after surgery. OTHER INSTRUCTIONS: ______________________________________________________________________________________________ ____________________________________________________________________________________________ WHEN TO CALL YOUR DR [place]: Fever over 101.0 Nausea and/or vomiting Extreme swelling or bruising Continued bleeding from incision. Increased pain, redness, or drainage from the incision. The clinic staff is available to answer your questions during  regular business hours.  Please don't hesitate to call and ask to speak to one of the nurses for clinical concerns.  If you have a medical emergency, go to the nearest emergency room or call 911.  A surgeon from [facility] is always on call at the hospital. [address], [address], [place],  [place]  [address] ? [address] [address], [place], [place]   [address] [phone] ? [phone] ? FAX [phone] Web site: [email or web address]  Regional Anesthesia Blocks  1. You may not be able to move or feel the "blocked" extremity after a regional anesthetic block. This may last may last from 3-48 hours after placement, but it will go away. The length of time depends on the medication injected and your individual response to the medication. As the nerves start to wake up, you may experience tingling as the movement and feeling returns to your extremity. If the numbness and inability to move your extremity has not gone away after 48 hours, please call your surgeon.   2. The extremity that is blocked will need to be protected until the numbness is gone and the strength has returned. Because you cannot feel it, you will need to take extra care to avoid injury. Because it may be weak, you may have difficulty moving it or using it. You may not know what position it is in without looking at it while the block is in effect.  3. For blocks in the legs and feet, returning to weight bearing and walking needs to be done carefully. You will need to wait until the numbness is entirely gone and the strength has returned. You should be able to move your leg and foot normally before you try and bear weight or walk. You will need someone to be with you when you first try to ensure you do not fall and possibly risk injury.  4. Bruising and tenderness at the needle site are common side effects and will resolve in a few days.  5. Persistent numbness or new problems with movement should be  communicated to the surgeon or the [facility] [phone] [facility] [phone]).  Post Anesthesia Home Care Instructions  Activity: Get plenty of rest for the remainder of the day. A responsible individual must stay with you for 24 hours following the procedure.  For the next 24 hours, DO NOT: -Drive a car -Advertising copywriter -Drink alcoholic beverages -Take any medication unless instructed by your physician -Make any legal decisions or sign important papers.  Meals: Start with liquid foods such as gelatin or soup. Progress to regular foods as tolerated. Avoid greasy, spicy, heavy foods. If nausea and/or vomiting occur, drink only clear liquids until the nausea and/or vomiting subsides. Call your physician if vomiting continues.  Special Instructions/Symptoms: Your throat may feel dry or sore from the anesthesia or the breathing tube placed in your throat during surgery. If this causes discomfort, gargle with warm salt water. The discomfort should disappear within 24 hours.  If you had a scopolamine patch placed behind your ear for the management of post- operative nausea and/or vomiting:  1. The medication in the patch is effective for 72 hours, after which it should be removed.  Wrap patch in a tissue and discard in the trash. Wash hands thoroughly with soap and water. 2. You may remove the patch earlier than 72 hours if you experience unpleasant side effects which may include dry mouth, dizziness or visual disturbances. 3. Avoid touching the patch. Wash your hands with soap and water after contact with the patch.

## 2022-10-18 NOTE — Interval H&P Note (Signed)
History and Physical Interval Note:  10/18/2022 12:38 PM  Jessica [name]  has presented today for surgery, with the diagnosis of LEFT BREAST CANCER.  The various methods of treatment have been discussed with the patient and family. After consideration of risks, benefits and other options for treatment, the patient has consented to  Procedure(s) with comments: BILATERAL SIMPLE MASTECTOMY (Bilateral) - PEC BLOCK as a surgical intervention.  The patient's history has been reviewed, patient examined, no change in status, stable for surgery.  I have reviewed the patient's chart and labs.  Questions were answered to the patient's satisfaction.      A 

## 2022-10-18 NOTE — Transfer of Care (Signed)
Immediate Anesthesia Transfer of Care Note  Patient: Jessica [name]  Procedure(s) Performed: BILATERAL SIMPLE MASTECTOMY (Bilateral)  Patient Location: PACU  Anesthesia Type:GA combined with regional for post-op pain  Level of Consciousness: awake, alert , and oriented  Airway & Oxygen Therapy: Patient Spontanous Breathing and Patient connected to face mask oxygen  Post-op Assessment: Report given to RN and Post -op Vital signs reviewed and stable  Post vital signs: Reviewed and stable  Last Vitals:  Vitals Value Taken Time  BP    Temp    Pulse    Resp    SpO2      Last Pain:  Vitals:   10/18/22 1133  PainSc: 0-No pain      Patients Stated Pain Goal: 3 (10/18/22 1133)  Complications: No notable events documented.

## 2022-10-18 NOTE — Anesthesia Postprocedure Evaluation (Signed)
Anesthesia Post Note  Patient: Jessica [name]  Procedure(s) Performed: BILATERAL SIMPLE MASTECTOMY (Bilateral: Breast)     Patient location during evaluation: PACU Anesthesia Type: Regional and General Level of consciousness: awake and alert Pain management: pain level controlled Vital Signs Assessment: post-procedure vital signs reviewed and stable Respiratory status: spontaneous breathing, nonlabored ventilation, respiratory function stable and patient connected to nasal cannula oxygen Cardiovascular status: blood pressure returned to baseline and stable Postop Assessment: no apparent nausea or vomiting Anesthetic complications: no   No notable events documented.  Last Vitals:  Vitals:   10/18/22 1715 10/18/22 1930  BP: (!) 150/81 131/78  Pulse: 70 81  Resp: 16 16  Temp: 36.4 C 36.4 C  SpO2: 97% 94%    Last Pain:  Vitals:   10/18/22 1930  PainSc: 3                  ,

## 2022-10-18 NOTE — Progress Notes (Signed)
Assisted Dr. Armond Hang with left, right, pectoralis block. Side rails up, monitors on throughout procedure. See vital signs in flow sheet. Tolerated Procedure well.

## 2022-10-18 NOTE — Anesthesia Procedure Notes (Signed)
Anesthesia Regional Block: Pectoralis block   Pre-Anesthetic Checklist: , timeout performed,  Correct Patient, Correct Site, Correct Laterality,  Correct Procedure, Correct Position, site marked,  Risks and benefits discussed,  Pre-op evaluation,  At surgeon's request and post-op pain management  Laterality: Right  Prep: Maximum Sterile Barrier Precautions used, chloraprep       Needles:  Injection technique: Single-shot  Needle Type: Echogenic Stimulator Needle     Needle Length: 9cm  Needle Gauge: 21     Additional Needles:   Procedures:,,,, ultrasound used (permanent image in chart),,    Narrative:  Start time: 10/18/2022 1:15 PM End time: 10/18/2022 1:17 PM Injection made incrementally with aspirations every 5 mL. Anesthesiologist: Elmer Picker, MD

## 2022-10-18 NOTE — Anesthesia Preprocedure Evaluation (Addendum)
Anesthesia Evaluation  Patient identified by MRN, date of birth, ID band Patient awake    Reviewed: Allergy & Precautions, NPO status , Patient's Chart, lab work & pertinent test results  Airway Mallampati: III  TM Distance: >3 FB Neck ROM: Full    Dental no notable dental hx. (+) Teeth Intact, Dental Advisory Given   Pulmonary neg pulmonary ROS   Pulmonary exam normal breath sounds clear to auscultation       Cardiovascular + angina  + CAD, + Past MI and + Cardiac Stents  Normal cardiovascular exam Rhythm:Regular Rate:Normal  TTE 2023 1. Left ventricular ejection fraction, by estimation, is 60 to 65%. The  left ventricle has normal function. The left ventricle has no regional  wall motion abnormalities. Left ventricular diastolic parameters were  normal.   2. Right ventricular systolic function is low normal. The right  ventricular size is normal. There is normal pulmonary artery systolic  pressure. The estimated right ventricular systolic pressure is 23.6 mmHg.   3. The mitral valve is normal in structure. Trivial mitral valve  regurgitation. No evidence of mitral stenosis.   4. The aortic valve was not well visualized. Aortic valve regurgitation  is trivial. No aortic stenosis is present.   5. The inferior vena cava is normal in size with greater than 50%  respiratory variability, suggesting right atrial pressure of 3 mmHg.   Cath 2022  Dist LAD lesion is 75% stenosed.    Subtotal occlusion of the mid LAD with distal TIMI grade II flow.  Stenosis is a [place] 010 bifurcation stenosis with the first diagonal.  Successful angioplasty and stenting using a 22 x 2.0 mm Onyx postdilated to 2.25 mm in diameter, avoiding jailing the larger first diagonal.  0% stenosis with TIMI grade III flow was noted.  Tandem 50 and 40% proximal and mid RCA stenoses.  Right coronary is codominant.  Left main is widely patent.    Circumflex  is widely patent.  The circumflex and obtuse marginals are tortuous.  Apical severe hypokinesis/akinesis.  LVEDP 6 mmHg. RECOMMENDATIONS:  Aspirin and Brilinta for 12 months.  Aggressive risk factor modification  High intensity statin therapy     Neuro/Psych  PSYCHIATRIC DISORDERS Anxiety Depression    negative neurological ROS     GI/Hepatic negative GI ROS, Neg liver ROS,,,  Endo/Other  Hypothyroidism    Renal/GU negative Renal ROS  negative genitourinary   Musculoskeletal negative musculoskeletal ROS (+)    Abdominal   Peds  Hematology  (+) Blood dyscrasia (plavix)   Anesthesia Other Findings   Reproductive/Obstetrics                             Anesthesia Physical Anesthesia Plan  ASA: 3  Anesthesia Plan: General and Regional   Post-op Pain Management: Regional block* and Tylenol PO (pre-op)*   Induction: Intravenous  PONV Risk Score and Plan: 3 and Dexamethasone, Ondansetron and Treatment may vary due to age or medical condition  Airway Management Planned: Oral ETT  Additional Equipment:   Intra-op Plan:   Post-operative Plan: Extubation in OR  Informed Consent: I have reviewed the patients History and Physical, chart, labs and discussed the procedure including the risks, benefits and alternatives for the proposed anesthesia with the patient or authorized representative who has indicated his/her understanding and acceptance.     Dental advisory given  Plan Discussed with: CRNA  Anesthesia Plan Comments:  Anesthesia Quick Evaluation

## 2022-10-18 NOTE — Op Note (Addendum)
Preoperative diagnosis: Stage I left breast cancer upper outer quadrant ER positive  Postoperative diagnosis: Same  Procedure: Left simple mastectomy, right risk reducing mastectomy with biopsy of abnormal left axillary lymph node  Surgeon: Harriette Bouillon, MD  Anesthesia: General With bilateral pectoral blocks  EBL: 40 cc  Specimen: Left breast, right breast, left axillary  nodes all sent to pathology  Drains: 19 round 1 to each side  Indications for procedure: The patient is an 82 year old female with stage I left breast lobular carcinoma.  She was seen in consultation in the [facility].  We discussed all of her options including breast conserving surgery.  After lengthy discussion, she desires bilateral mastectomies.  We reviewed the data concerning this approach as well as potential complications of bleeding and infection which would double with risk reducing measures.  We discussed her advanced age and that she certainly can be treated conservatively if she desired.  She was adamant about undergoing bilateral mastectomies and had no interest in reconstruction.  Also, she had no interest in sentinel lymph node mapping after discussing that with her at great length.  I did discuss possibly moving an abnormal lymph node if I felt 1 and she was fine with that.  She had significant concerns about lymphedema and quality of life since she is in assisted living but still was quite independent and active.The surgical and non surgical options have been discussed with the patient.  Risks of surgery include bleeding,  Infection,  Flap necrosis,  Tissue loss,   lymphedema  Chronic pain, death, Numbness,  And the need for additional procedures.  Reconstruction options also have been discussed with the patient as well.  The patient agrees to proceed.   Description of procedure: The patient was met in the holding area questions were answered.  She was then taken back to the operative room placed supine upon the  operating table.  After induction of general anesthesia, both breasts were prepped and draped in sterile fashion timeout performed.  The left side was approached first.  A curvilinear incision was made above and below the nipple areolar complex.  A superior skin flap was taken to the clavicle, inferior skin flap taken to the inframammary fold and a medial skin flap taken the sternum.  Once the breast was dissected free, it was dissected off of the pectoralis fashion and a medial to lateral fashion into the lateral attachments were identified and divided.  This was oriented with this superior stitch and sent to pathology.  Upon examination of axilla I did palpate 1 abnormal node and remove this.  This was in the deep level 1 basin.  Hemostasis was achieved with cautery.  Small vessels were controlled from clips cautery and a TXA soaked sponge was used and placed in the wound.    Gloves were changed.  The right side was then addressed.  A curvilinear incision was made above and below the nipple areolar complex.  In a similar fishmouth configuration a superior skin flap, medial skin flap and inferior skin flaps were developed.  Once the breast was dissected free, it was dissected off the pectoralis fashion to include the fascia in a medial to lateral fashion until the lateral attachments were identified and divided.  Hemostasis done with the  cautery.  A TXA soaked sponge was placed.  Once ensuring adequate hemostasis on both sides, a 19 round drain was placed on each side through a separate stab incision and secured to skin with 2-0 nylon.  1  final inspection for hemostasis was excellent.  I did place Arista in the left axilla where no biopsy was done.  We then closed both sides of the deep layer of 3-0 Vicryl.  4 Monocryl was used to close both sides in a subcuticular fashion.  Dermabond applied.  Breast binder placed.  Bulbs were attached and placed on the drains to suction.  All counts were found to be  correct.  The patient was awoke extubated taken to recovery in satisfactory condition.

## 2022-10-18 NOTE — Anesthesia Procedure Notes (Signed)
Anesthesia Regional Block: Pectoralis block   Pre-Anesthetic Checklist: , timeout performed,  Correct Patient, Correct Site, Correct Laterality,  Correct Procedure, Correct Position, site marked,  Risks and benefits discussed,  Pre-op evaluation,  At surgeon's request and post-op pain management  Laterality: Left  Prep: Maximum Sterile Barrier Precautions used, chloraprep       Needles:  Injection technique: Single-shot  Needle Type: Echogenic Stimulator Needle     Needle Length: 9cm  Needle Gauge: 21     Additional Needles:   Procedures:,,,, ultrasound used (permanent image in chart),,    Narrative:  Start time: 10/18/2022 1:18 PM End time: 10/18/2022 1:21 PM Injection made incrementally with aspirations every 5 mL. Anesthesiologist: Elmer Picker, MD

## 2022-10-19 ENCOUNTER — Encounter: Payer: Self-pay | Admitting: Physical Therapy

## 2022-10-19 ENCOUNTER — Encounter (HOSPITAL_BASED_OUTPATIENT_CLINIC_OR_DEPARTMENT_OTHER): Payer: Self-pay | Admitting: Surgery

## 2022-10-19 DIAGNOSIS — I252 Old myocardial infarction: Secondary | ICD-10-CM | POA: Diagnosis not present

## 2022-10-19 DIAGNOSIS — N189 Chronic kidney disease, unspecified: Secondary | ICD-10-CM | POA: Diagnosis not present

## 2022-10-19 DIAGNOSIS — Z171 Estrogen receptor negative status [ER-]: Secondary | ICD-10-CM | POA: Diagnosis not present

## 2022-10-19 DIAGNOSIS — C50412 Malignant neoplasm of upper-outer quadrant of left female breast: Secondary | ICD-10-CM | POA: Diagnosis not present

## 2022-10-19 DIAGNOSIS — E785 Hyperlipidemia, unspecified: Secondary | ICD-10-CM | POA: Diagnosis not present

## 2022-10-19 DIAGNOSIS — Z7902 Long term (current) use of antithrombotics/antiplatelets: Secondary | ICD-10-CM | POA: Diagnosis not present

## 2022-10-19 NOTE — Discharge Summary (Signed)
Physician Discharge Summary  Patient ID: Jessica [name] MRN: [id number] DOB/AGE: 81-Apr-1942 81 y.o.  Admit date: 10/18/2022 Discharge date: 10/19/2022  Admission Diagnoses: Breast cancer CAD  Discharge Diagnoses:  Active Problems:   * No active hospital problems. *   Discharged Condition: good  Hospital Course: 13 yof s/p left mastectomy, right risk reducing mastectomy with node removal doing well following morning.  She is ready for dc.   Consults: None  Significant Diagnostic Studies: none  Treatments: surgery: Left simple mastectomy, right risk reducing mastectomy with biopsy of abnormal left axillary lymph node   Discharge Exam: Blood pressure 98/64, pulse 61, temperature 98.1 F (36.7 C), resp. rate 16, height 5\' 3"  (1.6 m), weight 70 kg, SpO2 94%. Flaps viable, no hematoma, drain as expected  Disposition: Discharge disposition: 01-Home or Self Care       Discharge Instructions     Diet - low sodium heart healthy   Complete by: As directed    Discharge instructions   Complete by: As directed    Do not restart plavix until sunday   Increase activity slowly   Complete by: As directed       Allergies as of 10/19/2022   No Known Allergies      Medication List     TAKE these medications    acetaminophen 650 MG CR tablet Commonly known as: TYLENOL Take 1,300 mg by mouth every 8 (eight) hours as needed for pain.   Calcium 600+D 600-800 MG-UNIT Tabs Generic drug: Calcium Carb-Cholecalciferol Take 2 tablets by mouth daily.   clopidogrel 75 MG tablet Commonly known as: PLAVIX TAKE 1 TABLET BY MOUTH DAILY   denosumab 60 MG/ML Sosy injection Commonly known as: PROLIA Inject 60 mg into the skin every 6 (six) months.   ferrous sulfate 325 (65 FE) MG tablet Take 325 mg by mouth daily with breakfast.   gabapentin 100 MG capsule Commonly known as: NEURONTIN Take 100 mg by mouth daily.   levothyroxine 112 MCG tablet Commonly known as:  SYNTHROID Take 1 tablet (112 mcg total) by mouth daily.   loratadine 10 MG tablet Commonly known as: CLARITIN Take 10 mg by mouth as needed.   melatonin 5 MG Tabs Take 5 mg by mouth daily.   nitroGLYCERIN 0.4 MG SL tablet Commonly known as: NITROSTAT TAKE 1 TABLET UNDER THE TONGUE EVERY 5 MINUTES AS NEEDED FOR CHEST PAIN What changed: See the new instructions.   [name] olpasiran (AMG 890) or placebo 142 mg/mL SQ injection Inject 142 mg into the skin once. For investigational use only - Inject 1 mL (1 prefilled syringe) subcutaneously into appropriate injection site per protocol. (Approved injection site(s): upper arm, upper thigh, abdomen). Please contact [facility] if any questions.   oxyCODONE 5 MG immediate release tablet Commonly known as: Oxy IR/ROXICODONE Take 1 tablet (5 mg total) by mouth every 6 (six) hours as needed for severe pain.   rosuvastatin 20 MG tablet Commonly known as: CRESTOR TAKE 1 TABLET BY MOUTH DAILY        Follow-up Information     Cornett, Thomas, MD Follow up in 2 week(s).   Specialty: General Surgery Contact information: [address] [place] [address] [phone]                 Signed: Emelia Loron 10/19/2022, 7:33 AM

## 2022-10-24 ENCOUNTER — Telehealth: Payer: Self-pay | Admitting: *Deleted

## 2022-10-24 NOTE — Telephone Encounter (Signed)
Repeat prognostics ordered on final surgical specimen.

## 2022-10-25 ENCOUNTER — Encounter: Payer: Self-pay | Admitting: Surgery

## 2022-10-25 ENCOUNTER — Encounter: Payer: Self-pay | Admitting: *Deleted

## 2022-10-27 ENCOUNTER — Ambulatory Visit: Payer: Medicare Other

## 2022-10-28 ENCOUNTER — Encounter: Payer: Self-pay | Admitting: *Deleted

## 2022-11-03 ENCOUNTER — Encounter: Payer: Self-pay | Admitting: Hematology and Oncology

## 2022-11-03 ENCOUNTER — Ambulatory Visit: Payer: Self-pay | Admitting: Surgery

## 2022-11-03 ENCOUNTER — Inpatient Hospital Stay: Payer: Medicare Other | Attending: Hematology and Oncology | Admitting: Hematology and Oncology

## 2022-11-03 ENCOUNTER — Encounter: Payer: Self-pay | Admitting: *Deleted

## 2022-11-03 VITALS — BP 110/61 | HR 68 | Temp 97.5°F | Resp 18 | Ht 63.0 in | Wt 150.8 lb

## 2022-11-03 DIAGNOSIS — Z9012 Acquired absence of left breast and nipple: Secondary | ICD-10-CM | POA: Insufficient documentation

## 2022-11-03 DIAGNOSIS — C50412 Malignant neoplasm of upper-outer quadrant of left female breast: Secondary | ICD-10-CM

## 2022-11-03 DIAGNOSIS — Z171 Estrogen receptor negative status [ER-]: Secondary | ICD-10-CM

## 2022-11-03 MED ORDER — PROCHLORPERAZINE MALEATE 10 MG PO TABS: 10.0000 mg | ORAL_TABLET | Freq: Four times a day (QID) | ORAL | 1 refills | Status: DC | PRN

## 2022-11-03 MED ORDER — LIDOCAINE-PRILOCAINE 2.5-2.5 % EX CREA
TOPICAL_CREAM | CUTANEOUS | 3 refills | Status: DC
Start: 2022-11-03 — End: 2022-11-09

## 2022-11-03 MED ORDER — ONDANSETRON HCL 8 MG PO TABS: 8.0000 mg | ORAL_TABLET | Freq: Three times a day (TID) | ORAL | 1 refills | Status: DC | PRN

## 2022-11-03 NOTE — Assessment & Plan Note (Signed)
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  Indeterminate 1.2 cm right breast mass   Recommendation: 10/18/2022:Left mastectomy: Grade 2 invasive lobular cancer 2.7 cm with DCIS, margins negative, suspicious for LVI, ER 0%, PR 0%, HER2 negative, Ki-67 5%, 0/7 lymph nodes Right mastectomy: Benign adjuvant CMF x 6 cycles Adjuvant radiation therapy   Return to clinic to start chemo in 3 weeks

## 2022-11-03 NOTE — Progress Notes (Signed)
Patient Care Team: Mahlon Gammon, MD as PCP - General (Internal Medicine) Tonny Bollman, MD as PCP - Cardiology (Cardiology) Bernette Redbird, MD as Consulting Physician (Gastroenterology) Antony Contras, MD as Consulting Physician (Ophthalmology) [place], Amy, MD as Consulting Physician (Dermatology) Mast, Man X, NP as Nurse Practitioner (Internal Medicine) Pershing Proud, RN as Oncology Nurse Navigator Donnelly Angelica, RN as Oncology Nurse Navigator Serena Croissant, MD as Consulting Physician (Hematology and Oncology) Harriette Bouillon, MD as Consulting Physician (General Surgery)  DIAGNOSIS:  Encounter Diagnosis  Name Primary?   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 Indeterminate 1.2 cm right breast mass    09/30/2022 Initial Biopsy   Right breast biopsy demonstrates intraductal papilloma and no evidence of atypia or malignancy.   10/18/2022 Surgery   Left mastectomy scheduled with Dr. Luisa Hart   10/18/2022 Surgery   Left mastectomy: Grade 2 invasive lobular cancer 2.7 cm with DCIS, margins negative, suspicious for LVI, ER 0%, PR 0%, HER2  negative, Ki-67 5%, 0/7 lymph nodes Right mastectomy: Benign     CHIEF COMPLIANT: F/U postoperative  INTERVAL HISTORY: Jessica [name] is a 82 year old above-mentioned history of a screening mammogram detected left breast mass. She presents to the clinic for a follow-up after having surgery.  She is doing extremely well without any pain or discomfort.   ALLERGIES:  has No Known Allergies.  MEDICATIONS:  Current Outpatient Medications  Medication Sig Dispense Refill   acetaminophen (TYLENOL) 650 MG CR tablet Take 1,300 mg by mouth every 8 (eight) hours as needed for pain.     Calcium Carb-Cholecalciferol (CALCIUM 600+D) 600-800 MG-UNIT TABS Take 2 tablets by mouth daily.     clopidogrel (PLAVIX) 75 MG tablet TAKE 1 TABLET BY MOUTH DAILY 90 tablet 2   denosumab (PROLIA) 60 MG/ML SOSY injection Inject 60 mg into the skin every 6 (six) months.     ferrous sulfate 325 (65 FE) MG tablet Take 325 mg by mouth daily with breakfast.     gabapentin (NEURONTIN) 100 MG capsule Take 100 mg by mouth daily.     levothyroxine (SYNTHROID) 112 MCG tablet Take 1 tablet (112 mcg total) by mouth daily. 90 tablet 3   loratadine (CLARITIN) 10 MG tablet Take 10 mg by mouth as needed.     melatonin 5 MG TABS Take 5 mg by mouth daily.     nitroGLYCERIN (NITROSTAT) 0.4 MG SL tablet TAKE 1 TABLET UNDER THE TONGUE EVERY 5 MINUTES AS NEEDED FOR CHEST PAIN (Patient taking differently: Never used) 25 tablet 6   rosuvastatin (CRESTOR) 20 MG tablet TAKE 1 TABLET BY MOUTH DAILY 30 tablet 6   Study - [name] - olpasiran (AMG  890) 142 mg/mL or placebo SQ injection (PI-[place]) Inject 142 mg into the skin once. For investigational use only - Inject 1 mL (1 prefilled syringe) subcutaneously into appropriate injection site per protocol. (Approved injection site(s): upper arm, upper thigh, abdomen). Please contact [facility] if any questions.     oxyCODONE (OXY IR/ROXICODONE) 5 MG immediate release tablet Take 1  tablet (5 mg total) by mouth every 6 (six) hours as needed for severe pain. (Patient not taking: Reported on 11/03/2022) 15 tablet 0   No current facility-administered medications for this visit.    PHYSICAL EXAMINATION: ECOG PERFORMANCE STATUS: 1 - Symptomatic but completely ambulatory  Vitals:   11/03/22 1022  BP: 110/61  Pulse: 68  Resp: 18  Temp: (!) 97.5 F (36.4 C)  SpO2: 96%   Filed Weights   11/03/22 1022  Weight: 150 lb 12.8 oz (68.4 kg)      LABORATORY DATA:  I have reviewed the data as listed    Latest Ref Rng & Units 05/30/2022    8:11 AM 12/30/2021    8:00 AM 09/23/2021    8:10 AM  CMP  Glucose 65 - 99 mg/dL 91  098  79   BUN 7 - 25 mg/dL 16  20  18    Creatinine 0.60 - 0.95 mg/dL 1.19  1.47  8.29   Sodium 135 - 146 mmol/L 140  139  138   Potassium 3.5 - 5.3 mmol/L 4.6  4.0  4.2   Chloride 98 - 110 mmol/L 107  105  105   CO2 20 - 32 mmol/L 24  27  22    Calcium 8.6 - 10.4 mg/dL 8.9  8.6  9.0   Total Protein 6.1 - 8.1 g/dL 6.4  6.0  6.1   Total Bilirubin 0.2 - 1.2 mg/dL 0.5  0.5  0.5   AST 10 - 35 U/L 15  16  16    ALT 6 - 29 U/L 15  14  16      Lab Results  Component Value Date   WBC 7.4 05/30/2022   HGB 13.6 05/30/2022   HCT 39.2 05/30/2022   MCV 94.5 05/30/2022   PLT 257 05/30/2022   NEUTROABS 4,433 05/30/2022    ASSESSMENT & PLAN:  Malignant neoplasm of upper-outer quadrant of left breast in female, estrogen receptor negative (HCC) 09/13/2022: Screening mammogram detected spiculated left breast mass upper outer quadrant 5 o'clock position measuring 2.1 cm, axilla negative, 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  Indeterminate 1.2 cm right breast mass   Recommendation: 10/18/2022:Left mastectomy: Grade 2 invasive lobular cancer 2.7 cm with DCIS, margins negative, suspicious for LVI, ER 0%,  PR 0%, HER2 negative, Ki-67 5%, 0/7 lymph nodes Right mastectomy: Benign adjuvant CMF x 6 cycles Adjuvant radiation therapy   Return to clinic to start chemo on 11/24/2022   No orders of the defined types were placed in this encounter.  The patient has a good understanding of the overall plan. she agrees with it. she will call with any problems that may develop before the next visit here. Total time spent: 30 mins including face to face time and time spent for planning, charting and co-ordination of care   Tamsen Meek, MD 11/03/22    I Janan Ridge am acting as a Neurosurgeon for Dr.Vaniyah Lansky Pamelia Hoit  I  have reviewed the above documentation for accuracy and completeness, and I agree with the above.

## 2022-11-03 NOTE — Progress Notes (Signed)
START OFF PATHWAY REGIMEN - Breast   [id number]:CMF (Cyclophosphamide IV + Methotrexate IV + Fluorouracil IV) q21 Days:   A cycle is every 21 days:     Cyclophosphamide      Fluorouracil      Methotrexate   **Always confirm dose/schedule in your pharmacy ordering system**  Patient Characteristics: Postoperative without Neoadjuvant Therapy, M0 (Pathologic Staging), Invasive Disease, Adjuvant Therapy, HER2 Negative, ER Negative, Node Negative, pT1a-c, N19mi or pT1c or Higher, pN0 Therapeutic Status: Postoperative without Neoadjuvant Therapy, M0 (Pathologic Staging) AJCC Grade: G2 AJCC N Category: pN0 AJCC M Category: cM0 ER Status: Negative (-) AJCC 8 Stage Grouping: IIA HER2 Status: Negative (-) Oncotype Dx Recurrence Score: Not Appropriate AJCC T Category: pT2 PR Status: Negative (-) Intent of Therapy: Curative Intent, Discussed with Patient

## 2022-11-04 ENCOUNTER — Other Ambulatory Visit: Payer: Self-pay

## 2022-11-08 ENCOUNTER — Telehealth: Payer: Self-pay | Admitting: *Deleted

## 2022-11-08 NOTE — Telephone Encounter (Signed)
Spoke with patient regarding her schedule.  Port is scheduled for 9/10. She also had chemo education that same day.  R/S chemo educ for 9/11 at 2pm. Patient aware.  Informed her that her chemo appts had not been scheduled yet and would send a message for an update.  Patient verbalized understanding.

## 2022-11-08 NOTE — Pre-Procedure Instructions (Signed)
Surgical Instructions   Your procedure is scheduled on November 15, 2022. Report to [facility] Main Entrance "A" at 5:30 A.M., then check in with the Admitting office. Any questions or running late day of surgery: call [phone]  Questions prior to your surgery date: call [phone], Monday-Friday, 8am-4pm. If you experience any cold or flu symptoms such as cough, fever, chills, shortness of breath, etc. between now and your scheduled surgery, please notify us at the above number.     Remember:  Do not eat after midnight the night before your surgery   You may drink clear liquids until 4:30 AM the morning of your surgery.   Clear liquids allowed are: Water, Non-Citrus Juices (without pulp), Carbonated Beverages, Clear Tea, Black Coffee Only (NO MILK, CREAM OR POWDERED CREAMER of any kind), and Gatorade.    Take these medicines the morning of surgery with A SIP OF WATER: gabapentin (NEURONTIN)  levothyroxine (SYNTHROID)  rosuvastatin (CRESTOR)   May take these medicines IF NEEDED: acetaminophen (TYLENOL)  loratadine (CLARITIN)  nitroGLYCERIN (NITROSTAT) - please call [phone] if dose taken prior to surgery. prochlorperazine (COMPAZINE)    Follow your surgeon's instructions on when to stop clopidogrel (PLAVIX).  If no instructions were given by your surgeon then you will need to call the office to get those instructions.     One week prior to surgery, STOP taking any Aspirin (unless otherwise instructed by your surgeon) Aleve, Naproxen, Ibuprofen, Motrin, Advil, Goody's, BC's, all herbal medications, fish oil, and non-prescription vitamins.                     Do NOT Smoke (Tobacco/Vaping) for 24 hours prior to your procedure.  If you use a CPAP at night, you may bring your mask/headgear for your overnight stay.   You will be asked to remove any contacts, glasses, piercing's, hearing aid's, dentures/partials prior to surgery. Please bring cases for these items if  needed.    Patients discharged the day of surgery will not be allowed to drive home, and someone needs to stay with them for 24 hours.  SURGICAL WAITING ROOM VISITATION Patients may have no more than 2 support people in the waiting area - these visitors may rotate.   Pre-op nurse will coordinate an appropriate time for 1 ADULT support person, who may not rotate, to accompany patient in pre-op.  Children under the age of 67 must have an adult with them who is not the patient and must remain in the main waiting area with an adult.  If the patient needs to stay at the hospital during part of their recovery, the visitor guidelines for inpatient rooms apply.  Please refer to the [facility] website for the visitor guidelines for any additional information.   If you received a COVID test during your pre-op visit  it is requested that you wear a mask when out in public, stay away from anyone that may not be feeling well and notify your surgeon if you develop symptoms. If you have been in contact with anyone that has tested positive in the last 10 days please notify you surgeon.      Pre-operative CHG Bathing Instructions   You can play a key role in reducing the risk of infection after surgery. Your skin needs to be as free of germs as possible. You can reduce the number of germs on your skin by washing with CHG (chlorhexidine gluconate) soap before surgery. CHG is an antiseptic soap that kills germs  and continues to kill germs even after washing.   DO NOT use if you have an allergy to chlorhexidine/CHG or antibacterial soaps. If your skin becomes reddened or irritated, stop using the CHG and notify one of our RNs at [phone].              TAKE A SHOWER THE NIGHT BEFORE SURGERY AND THE DAY OF SURGERY    Please keep in mind the following:  DO NOT shave, including legs and underarms, 48 hours prior to surgery.   You may shave your face before/day of surgery.  Place clean sheets on your bed  the night before surgery Use a clean washcloth (not used since being washed) for each shower. DO NOT sleep with pet's night before surgery.  CHG Shower Instructions:  If you choose to wash your hair and private area, wash first with your normal shampoo/soap.  After you use shampoo/soap, rinse your hair and body thoroughly to remove shampoo/soap residue.  Turn the water OFF and apply half the bottle of CHG soap to a CLEAN washcloth.  Apply CHG soap ONLY FROM YOUR NECK DOWN TO YOUR TOES (washing for 3-5 minutes)  DO NOT use CHG soap on face, private areas, open wounds, or sores.  Pay special attention to the area where your surgery is being performed.  If you are having back surgery, having someone wash your back for you may be helpful. Wait 2 minutes after CHG soap is applied, then you may rinse off the CHG soap.  Pat dry with a clean towel  Put on clean pajamas    Additional instructions for the day of surgery: DO NOT APPLY any lotions, deodorants, cologne, or perfumes.   Do not wear jewelry or makeup Do not wear nail polish, gel polish, artificial nails, or any other type of covering on natural nails (fingers and toes) Do not bring valuables to the hospital. [facility] is not responsible for valuables/personal belongings. Put on clean/comfortable clothes.  Please brush your teeth.  Ask your nurse before applying any prescription medications to the skin.

## 2022-11-09 ENCOUNTER — Encounter (HOSPITAL_COMMUNITY): Payer: Self-pay

## 2022-11-09 ENCOUNTER — Ambulatory Visit (INDEPENDENT_AMBULATORY_CARE_PROVIDER_SITE_OTHER): Payer: Medicare Other

## 2022-11-09 ENCOUNTER — Other Ambulatory Visit: Payer: Self-pay

## 2022-11-09 ENCOUNTER — Encounter (HOSPITAL_COMMUNITY)
Admission: RE | Admit: 2022-11-09 | Discharge: 2022-11-09 | Disposition: A | Discharge status: Home / Self Care | Payer: Medicare Other | Source: Ambulatory Visit | Attending: Surgery | Admitting: Surgery

## 2022-11-09 VITALS — BP 129/71 | HR 61 | Temp 98.3°F | Resp 18 | Ht 63.0 in | Wt 151.9 lb

## 2022-11-09 DIAGNOSIS — Z01818 Encounter for other preprocedural examination: Secondary | ICD-10-CM

## 2022-11-09 DIAGNOSIS — Z171 Estrogen receptor negative status [ER-]: Secondary | ICD-10-CM | POA: Diagnosis not present

## 2022-11-09 DIAGNOSIS — Z01812 Encounter for preprocedural laboratory examination: Secondary | ICD-10-CM | POA: Diagnosis not present

## 2022-11-09 DIAGNOSIS — C50412 Malignant neoplasm of upper-outer quadrant of left female breast: Secondary | ICD-10-CM | POA: Diagnosis not present

## 2022-11-09 DIAGNOSIS — M810 Age-related osteoporosis without current pathological fracture: Secondary | ICD-10-CM | POA: Diagnosis not present

## 2022-11-09 HISTORY — DX: Acute myocardial infarction, unspecified: I21.9

## 2022-11-09 LAB — BASIC METABOLIC PANEL
Anion gap: 12 (ref 5–15)
BUN: 11 mg/dL (ref 8–23)
CO2: 21 mmol/L — ABNORMAL LOW (ref 22–32)
Calcium: 8.7 mg/dL — ABNORMAL LOW (ref 8.9–10.3)
Chloride: 104 mmol/L (ref 98–111)
Creatinine, Ser: 0.68 mg/dL (ref 0.44–1.00)
GFR, Estimated: >60 mL/min (ref >60)
Glucose, Bld: 104 mg/dL — ABNORMAL HIGH (ref 70–99)
Potassium: 3.8 mmol/L (ref 3.5–5.1)
Sodium: 137 mmol/L (ref 135–145)

## 2022-11-09 LAB — CBC
HCT: 39.3 % (ref 36.0–46.0)
Hemoglobin: 13.2 g/dL (ref 12.0–15.0)
MCH: 32.3 pg (ref 26.0–34.0)
MCHC: 33.6 g/dL (ref 30.0–36.0)
MCV: 96.1 fL (ref 80.0–100.0)
Platelets: 267 10*3/uL (ref 150–400)
RBC: 4.09 MIL/uL (ref 3.87–5.11)
RDW: 12.7 % (ref 11.5–15.5)
WBC: 8.3 10*3/uL (ref 4.0–10.5)
nRBC: 0 % (ref 0.0–0.2)

## 2022-11-09 MED ORDER — DENOSUMAB 60 MG/ML ~~LOC~~ SOSY
60.0000 mg | PREFILLED_SYRINGE | Freq: Once | SUBCUTANEOUS | Status: AC
Start: 2022-11-09 — End: 2022-11-09
  Administered 2022-11-09: 60 mg via SUBCUTANEOUS

## 2022-11-09 NOTE — Progress Notes (Signed)
PCP - Einar Crow Cardiologist - Tonny Bollman  PPM/ICD - denies Device Orders - n/a Rep Notified - n/a  Chest x-ray - denies EKG - 08-03-22 Stress Test - 11-08-13 ECHO - 08-29-22 Cardiac Cath - 09-01-20 stent x 1  Sleep Study - denies CPAP - n/a  DM- Deneis  Blood Thinner Instructions: Plavix instructed patient to follow up with cardiology for instructions Aspirin Instructions:n/a  ERAS Protcol - ERAS no drink   COVID TEST- n/a   Anesthesia review: yes hx of stent placement and on plavix  Patient denies shortness of breath, fever, cough and chest pain at PAT appointment   All instructions explained to the patient, with a verbal understanding of the material. Patient agrees to go over the instructions while at home for a better understanding. Patient also instructed to self quarantine after being tested for COVID-19. The opportunity to ask questions was provided.

## 2022-11-10 ENCOUNTER — Encounter: Payer: Self-pay | Admitting: *Deleted

## 2022-11-10 ENCOUNTER — Other Ambulatory Visit: Payer: Self-pay

## 2022-11-14 ENCOUNTER — Encounter: Payer: Self-pay | Admitting: Physical Therapy

## 2022-11-14 ENCOUNTER — Ambulatory Visit: Payer: Medicare Other | Attending: Surgery | Admitting: Physical Therapy

## 2022-11-14 DIAGNOSIS — M25612 Stiffness of left shoulder, not elsewhere classified: Secondary | ICD-10-CM

## 2022-11-14 DIAGNOSIS — R262 Difficulty in walking, not elsewhere classified: Secondary | ICD-10-CM | POA: Insufficient documentation

## 2022-11-14 DIAGNOSIS — Z171 Estrogen receptor negative status [ER-]: Secondary | ICD-10-CM

## 2022-11-14 DIAGNOSIS — R293 Abnormal posture: Secondary | ICD-10-CM | POA: Diagnosis not present

## 2022-11-14 DIAGNOSIS — M6281 Muscle weakness (generalized): Secondary | ICD-10-CM | POA: Diagnosis not present

## 2022-11-14 DIAGNOSIS — C50412 Malignant neoplasm of upper-outer quadrant of left female breast: Secondary | ICD-10-CM | POA: Insufficient documentation

## 2022-11-14 DIAGNOSIS — M25611 Stiffness of right shoulder, not elsewhere classified: Secondary | ICD-10-CM | POA: Diagnosis not present

## 2022-11-14 DIAGNOSIS — Z483 Aftercare following surgery for neoplasm: Secondary | ICD-10-CM | POA: Diagnosis not present

## 2022-11-14 NOTE — Therapy (Signed)
OUTPATIENT PHYSICAL THERAPY BREAST CANCER POST OP FOLLOW UP   Patient Name: Jessica [name] MRN: [id number] DOB:February 23, 1941, 82 y.o., female Today's Date: 11/14/2022  END OF SESSION:  PT End of Session - 11/14/22 1415     Visit Number 2    Number of Visits 10    Date for PT Re-Evaluation 12/12/22    PT Start Time 1400    PT Stop Time 1530    PT Time Calculation (min) 90 min    Activity Tolerance Patient tolerated treatment well    Behavior During Therapy [facility] for tasks assessed/performed             Past Medical History:  Diagnosis Date   Anemia 12/2015   Bronchitis, chronic (HCC)    in her 39s   Cataract    Chronic kidney disease    in 20s had nephritis   Closed fracture of right distal humerus    right   Coronary artery disease    Hand, foot and mouth disease (HFMD)    Hearing loss 05/03/2016   High cholesterol    Hypothyroidism    Myocardial infarction (HCC)    Osteopenia    Pericarditis 01/1999   Pneumothorax 01/1999   Thyroid disorder    Vitamin B 12 deficiency    Past Surgical History:  Procedure Laterality Date   birthmark      removed,left leg   BREAST BIOPSY Left 09/13/2022   [place] LT BREAST BX W LOC DEV 1ST LESION IMG BX SPEC US GUIDE 09/13/2022 [facility]   CARDIAC CATHETERIZATION     CATARACT EXTRACTION  2017   CORONARY STENT INTERVENTION N/A 09/01/2020   Procedure: CORONARY STENT INTERVENTION;  Surgeon: Lyn Records, MD;  Location: [facility];  Service: Cardiovascular;  Laterality: N/A;   LEFT HEART CATH AND CORONARY ANGIOGRAPHY N/A 09/01/2020   Procedure: LEFT HEART CATH AND CORONARY ANGIOGRAPHY;  Surgeon: Lyn Records, MD;  Location: [facility];  Service: Cardiovascular;  Laterality: N/A;   ORIF HUMERUS FRACTURE Right 04/10/2019   Procedure: RIGHT OPEN REDUCTION INTERNAL FIXATION (ORIF) DISTAL HUMERUS FRACTURE WITH EXTENSION;  Surgeon: Bjorn Pippin, MD;  Location: [facility];  Service: Orthopedics;   Laterality: Right;   SIMPLE MASTECTOMY WITH AXILLARY SENTINEL NODE BIOPSY Bilateral 10/18/2022   Procedure: BILATERAL SIMPLE MASTECTOMY;  Surgeon: Harriette Bouillon, MD;  Location: [facility];  Service: General;  Laterality: Bilateral;  PEC BLOCK   TONSILLECTOMY AND ADENOIDECTOMY  1952?   Patient Active Problem List   Diagnosis Date Noted   Malignant neoplasm of upper-outer quadrant of left breast in female, estrogen receptor negative (HCC) 09/21/2022   Cancer determined by biopsy of breast (HCC) 09/12/2022   Atherosclerotic heart disease of native coronary artery with other forms of angina pectoris (HCC) 04/08/2022   Swelling of left foot 04/07/2022   Left hand fracture, sequela 04/07/2022   Depression with anxiety 04/07/2022   NSTEMI (non-ST elevated myocardial infarction) (HCC) 09/01/2020   Closed fracture of right distal humerus 04/10/2019   Dyspnea 05/09/2018   Vitamin B 12 deficiency 05/09/2018   Cough 03/06/2018   Senile osteoporosis 11/20/2017   Iron deficiency 04/26/2017   Lichen sclerosus 04/26/2017   External hemorrhoid 04/26/2017   Weakness of left leg 06/07/2016   Balance disorder 05/24/2016   Memory deficit 05/24/2016   Anemia 05/19/2016   Hearing loss 05/03/2016   Hypothyroidism 07/24/2013   Bradycardia on ECG 07/24/2013   Numbness 08/07/2012   HYPERLIPIDEMIA  12/29/2006    REFERRING PROVIDER: Dr. Harriette Bouillon  REFERRING DIAG: Left breast cancer  THERAPY DIAG:  Malignant neoplasm of upper-outer quadrant of left breast in female, estrogen receptor negative (HCC)  Abnormal posture  Aftercare following surgery for neoplasm  Rationale for Evaluation and Treatment: Rehabilitation  ONSET DATE: 10/18/2022  SUBJECTIVE:                                                                                                                                                                                           SUBJECTIVE STATEMENT: Patient underwent a  bilateral mastectomy and left sentinel node biopsy (7 negative nodes) on 10/18/2022. Adjuvant chemotherapy is recommended as cancer is triple negative. Pt expressed frustration with the knowledge of having 7 nodes removed because "I only agreed to have 1 removed." She also expressed frustration with not getting a response from her doctor related to questions about anesthesia. PT encouraged her to discuss with him tomorrow before her port placement.  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 She underwent a bilateral mastectomy and left sentinel node biopsy (7 negative nodes) on 10/18/2022. It is triple negative with a Ki67 of 5%. She has some cardiac history and had an elbow fracture in 2022 resulting in surgery where pins were placed.   PATIENT GOALS:  Reassess how my recovery is going related to arm function, pain, and swelling.  PAIN:  Are you having pain? Yes: NPRS scale: 2/10 Pain location: Chest / breast bone Pain description: hard to describe Aggravating factors: worse in the morning Relieving factors: sleeping  PRECAUTIONS: Recent Surgery, left UE Lymphedema risk  RED FLAGS: None   ACTIVITY LEVEL / LEISURE: She is walking half to 1 mile daily   OBJECTIVE:   PATIENT SURVEYS:  QUICK DASH:  Quick Dash - 11/14/22 0001     Open a tight or new jar Mild difficulty    Do heavy household chores (wash walls, wash floors) No difficulty    Carry a shopping bag or briefcase No difficulty    Wash your back No difficulty    Use a knife to cut food No difficulty    Recreational activities in which you take some force or impact through your arm, shoulder, or hand (golf, hammering, tennis) No difficulty    During the past week, to what extent has your arm, shoulder or hand problem interfered with your normal social activities with family, friends, neighbors, or groups? Not at all    During the past week, to what extent has  your arm, shoulder or hand problem limited your work  or other regular daily activities Not at all    Arm, shoulder, or hand pain. Mild    Tingling (pins and needles) in your arm, shoulder, or hand Mild    Difficulty Sleeping Mild difficulty    DASH Score 9.09 %              OBSERVATIONS: Bilateral incisions appear to be healing well. There is skin overlap and edema present with tissue on the lateral aspects on her chest.  POSTURE:  Forward head and rounded shoulders   LYMPHEDEMA ASSESSMENT:   UPPER EXTREMITY AROM/PROM:   A/PROM RIGHT   eval   RIGHT 11/14/2022  Shoulder extension 63 60  Shoulder flexion 128 110  Shoulder abduction 137 136  Shoulder internal rotation 68 75  Shoulder external rotation 73 75                          (Blank rows = not tested)   A/PROM LEFT   eval LEFT 11/14/2022  Shoulder extension 53 49  Shoulder flexion 135 109  Shoulder abduction 146 114  Shoulder internal rotation 74 77  Shoulder external rotation 78 70                          (Blank rows = not tested)     UPPER EXTREMITY STRENGTH: WFL   LYMPHEDEMA ASSESSMENTS: NOT ASSESSED AS PT IS NOT HAVING ANY LYMPH NODES REMOVED   LANDMARK RIGHT   eval RIGHT 11/14/2022  10 cm proximal to olecranon process  Not entered due pt was not originally having nodes removed 27.6  Olecranon process   24.3  10 cm proximal to ulnar styloid process   20.1  Just proximal to ulnar styloid process   15.1  Across hand at thumb web space   16.7  At base of 2nd digit   6.1  (Blank rows = not tested)   LANDMARK LEFT   eval LEFT 11/14/2022  10 cm proximal to olecranon process   Not entered due pt was not originally having nodes removed 26.4  Olecranon process   23.4  10 cm proximal to ulnar styloid process   19.5  Just proximal to ulnar styloid process   13.9  Across hand at thumb web space   16.4  At base of 2nd digit   6.1  (Blank rows = not tested) Surgery type/Date: 10/18/2022 Number of lymph nodes  removed: 7 Current/past treatment (chemo, radiation, hormone therapy): none Other symptoms:  Heaviness/tightness No Pain Yes Pitting edema No Infections No Decreased scar mobility Yes Stemmer sign No  PATIENT EDUCATION:  Education details: HEP Person educated: Patient Education method: Medical illustrator Education comprehension: verbalized understanding and returned demonstration  HOME EXERCISE PROGRAM: Reviewed previously given post op HEP.  ASSESSMENT:  CLINICAL IMPRESSION: Patient is healing well s/p bilateral mastectomy and left sentinel node biopsy (7 negative nodes removed) on 10/18/2022. She has significant edema present on bilateral lateral flank regions and reports being unhappy with the appearance of her chest. We discussed the possible options of plastic surgeon after completion of chemo to assess if that may be an option to improve the appearance. She has limited shoulder ROM bilaterally and hypersensitivity on the skin of her midline chest. Compression foam applied to bilateral flank region just inferior to her axilla and to her midline chest to reduce pain and edema. She will benefit from PT to improve shoulder ROM and function  and address scar tissue when ready.  Pt will benefit from skilled therapeutic intervention to improve on the following deficits: Decreased knowledge of precautions, impaired UE functional use, pain, decreased ROM, postural dysfunction.   PT treatment/interventions: ADL/Self care home management, Therapeutic exercises, Therapeutic activity, Patient/Family education, Self Care, Manual lymph drainage, scar mobilization, Manual therapy, and Re-evaluation   GOALS: Goals reviewed with patient? Yes  LONG TERM GOALS:  (STG=LTG)  GOALS Name Target Date  Goal status  1 Pt will demonstrate she has regained full shoulder ROM and function post operatively compared to baselines.  Baseline: 12/12/2022 IN PROGRESS  2 Patient will report >/= 50%  less pain and sensitivity in her chest midline to tolerate daily tasks with greater ease 12/12/2022 INITIAL  3 Patient will improve her DASH score to be back to baseline (4.55) for improved overall UE function. 12/12/2022 INITIAL  4 Patient will report good understanding of lymphedema risk reduction practices. 12/12/2022 INITIAL     PLAN:  PT FREQUENCY/DURATION: 2x/week for 4 weeks  PLAN FOR NEXT SESSION: Begin ROM exercises bil shoulders; begin scar massage when fully healed (2-3 more weeks)   [facility]  [address]  [place] [address]  [phone]  After Breast Cancer Class It is recommended you attend the ABC class to be educated on lymphedema risk reduction. This class is free of charge and lasts for 1 hour. It is a 1-time class. You will need to download the TEAMS app either on your phone or computer. We will send you a link the night before or the morning of the class. You should be able to click on that link to join the class. This is not a confidential class. You don't have to turn your camera on, but other participants may be able to see your email address. You are scheduled for September 16th at 12:00.  Scar massage You can begin gentle scar massage to you incision sites. Gently place one hand on the incision and move the skin (without sliding on the skin) in various directions. Do this for a few minutes and then you can gently massage either coconut oil or vitamin E cream into the scars. YOU ARE NOT READY TO BEGIN THIS - WAIT FOR YOUR PT TO TELL YOU WHEN TO START.  Compression garment You should continue wearing your compression bra until you feel like you no longer have swelling.  Home exercise Program Continue doing the exercises you were given until you feel like you can do them without feeling any tightness at the end.   Walking Program Studies show that 30 minutes of walking per day (fast enough to elevate your heart rate) can  significantly reduce the risk of a cancer recurrence. If you can't walk due to other medical reasons, we encourage you to find another activity you could do (like a stationary bike or water exercise).  Posture After breast cancer surgery, people frequently sit with rounded shoulders posture because it puts their incisions on slack and feels better. If you sit like this and scar tissue forms in that position, you can become very tight and have pain sitting or standing with good posture. Try to be aware of your posture and sit and stand up tall to heal properly.  Follow up PT: It is recommended you return every 3 months for the first 3 years following surgery to be assessed on the SOZO machine for an L-Dex score. This helps prevent clinically significant lymphedema in 95% of  patients. These follow up screens are 10 minute appointments that you are not billed for.  Ma Munoz,MARTI COOPER, PT 11/14/2022, 3:41 PM

## 2022-11-14 NOTE — Patient Instructions (Signed)
[facility]  [address]  [place] [address]  [phone]  After Breast Cancer Class It is recommended you attend the ABC class to be educated on lymphedema risk reduction. This class is free of charge and lasts for 1 hour. It is a 1-time class. You will need to download the TEAMS app either on your phone or computer. We will send you a link the night before or the morning of the class. You should be able to click on that link to join the class. This is not a confidential class. You don't have to turn your camera on, but other participants may be able to see your email address. You are scheduled for September 16th at 12:00.  Scar massage You can begin gentle scar massage to you incision sites. Gently place one hand on the incision and move the skin (without sliding on the skin) in various directions. Do this for a few minutes and then you can gently massage either coconut oil or vitamin E cream into the scars. YOU ARE NOT READY TO BEGIN THIS - WAIT FOR YOUR PT TO TELL YOU WHEN TO START.  Compression garment You should continue wearing your compression bra until you feel like you no longer have swelling.  Home exercise Program Continue doing the exercises you were given until you feel like you can do them without feeling any tightness at the end.   Walking Program Studies show that 30 minutes of walking per day (fast enough to elevate your heart rate) can significantly reduce the risk of a cancer recurrence. If you can't walk due to other medical reasons, we encourage you to find another activity you could do (like a stationary bike or water exercise).  Posture After breast cancer surgery, people frequently sit with rounded shoulders posture because it puts their incisions on slack and feels better. If you sit like this and scar tissue forms in that position, you can become very tight and have pain sitting or standing with good posture. Try to be aware of your  posture and sit and stand up tall to heal properly.  Follow up PT: It is recommended you return every 3 months for the first 3 years following surgery to be assessed on the SOZO machine for an L-Dex score. This helps prevent clinically significant lymphedema in 95% of patients. These follow up screens are 10 minute appointments that you are not billed for.

## 2022-11-15 ENCOUNTER — Ambulatory Visit (HOSPITAL_COMMUNITY)
Admission: RE | Admit: 2022-11-15 | Discharge: 2022-11-15 | Disposition: A | Discharge status: Home / Self Care | Payer: Medicare Other | Attending: Surgery | Admitting: Surgery

## 2022-11-15 ENCOUNTER — Ambulatory Visit (HOSPITAL_COMMUNITY): Payer: Medicare Other

## 2022-11-15 ENCOUNTER — Ambulatory Visit: Payer: Medicare Other | Admitting: Pharmacist

## 2022-11-15 ENCOUNTER — Other Ambulatory Visit: Payer: Self-pay

## 2022-11-15 ENCOUNTER — Ambulatory Visit (HOSPITAL_COMMUNITY): Payer: Medicare Other | Admitting: Physician Assistant

## 2022-11-15 ENCOUNTER — Encounter (HOSPITAL_COMMUNITY)
Admission: RE | Disposition: A | Discharge status: Home / Self Care | Payer: Self-pay | Source: Home / Self Care | Attending: Surgery

## 2022-11-15 ENCOUNTER — Ambulatory Visit (HOSPITAL_BASED_OUTPATIENT_CLINIC_OR_DEPARTMENT_OTHER): Payer: Medicare Other | Admitting: Certified Registered Nurse Anesthetist

## 2022-11-15 ENCOUNTER — Other Ambulatory Visit: Payer: Medicare Other

## 2022-11-15 ENCOUNTER — Encounter (HOSPITAL_COMMUNITY): Payer: Self-pay | Admitting: Surgery

## 2022-11-15 ENCOUNTER — Encounter: Payer: Self-pay | Admitting: Hematology and Oncology

## 2022-11-15 DIAGNOSIS — I252 Old myocardial infarction: Secondary | ICD-10-CM | POA: Insufficient documentation

## 2022-11-15 DIAGNOSIS — N189 Chronic kidney disease, unspecified: Secondary | ICD-10-CM | POA: Diagnosis not present

## 2022-11-15 DIAGNOSIS — E039 Hypothyroidism, unspecified: Secondary | ICD-10-CM | POA: Diagnosis not present

## 2022-11-15 DIAGNOSIS — C50919 Malignant neoplasm of unspecified site of unspecified female breast: Secondary | ICD-10-CM | POA: Diagnosis not present

## 2022-11-15 DIAGNOSIS — Z452 Encounter for adjustment and management of vascular access device: Secondary | ICD-10-CM

## 2022-11-15 DIAGNOSIS — I2510 Atherosclerotic heart disease of native coronary artery without angina pectoris: Secondary | ICD-10-CM | POA: Insufficient documentation

## 2022-11-15 DIAGNOSIS — I214 Non-ST elevation (NSTEMI) myocardial infarction: Secondary | ICD-10-CM

## 2022-11-15 DIAGNOSIS — C50812 Malignant neoplasm of overlapping sites of left female breast: Secondary | ICD-10-CM | POA: Diagnosis not present

## 2022-11-15 DIAGNOSIS — Z955 Presence of coronary angioplasty implant and graft: Secondary | ICD-10-CM | POA: Diagnosis not present

## 2022-11-15 DIAGNOSIS — E785 Hyperlipidemia, unspecified: Secondary | ICD-10-CM | POA: Diagnosis not present

## 2022-11-15 DIAGNOSIS — I25118 Atherosclerotic heart disease of native coronary artery with other forms of angina pectoris: Secondary | ICD-10-CM | POA: Diagnosis not present

## 2022-11-15 HISTORY — PX: PORTACATH PLACEMENT: SHX2246

## 2022-11-15 SURGERY — INSERTION, TUNNELED CENTRAL VENOUS DEVICE, WITH PORT
Anesthesia: General

## 2022-11-15 MED ORDER — 0.9 % SODIUM CHLORIDE (POUR BTL) OPTIME
TOPICAL | Status: DC | PRN
  Administered 2022-11-15: 1000 mL

## 2022-11-15 MED ORDER — FENTANYL CITRATE (PF) 250 MCG/5ML IJ SOLN
INTRAMUSCULAR | Status: AC
  Filled 2022-11-15: qty 5

## 2022-11-15 MED ORDER — LACTATED RINGERS IV SOLN: INTRAVENOUS | Status: DC

## 2022-11-15 MED ORDER — PHENYLEPHRINE 80 MCG/ML (10ML) SYRINGE FOR IV PUSH (FOR BLOOD PRESSURE SUPPORT)
PREFILLED_SYRINGE | INTRAVENOUS | Status: AC
  Filled 2022-11-15: qty 10

## 2022-11-15 MED ORDER — CEFAZOLIN SODIUM-DEXTROSE 2-4 GM/100ML-% IV SOLN
2.0000 g | INTRAVENOUS | Status: AC
  Administered 2022-11-15: 2 g via INTRAVENOUS
  Filled 2022-11-15: qty 100

## 2022-11-15 MED ORDER — PHENYLEPHRINE 80 MCG/ML (10ML) SYRINGE FOR IV PUSH (FOR BLOOD PRESSURE SUPPORT)
PREFILLED_SYRINGE | INTRAVENOUS | Status: DC | PRN
  Administered 2022-11-15 (×3): 80 ug via INTRAVENOUS

## 2022-11-15 MED ORDER — CHLORHEXIDINE GLUCONATE 0.12 % MT SOLN
15.0000 mL | Freq: Once | OROMUCOSAL | Status: AC
  Administered 2022-11-15: 15 mL via OROMUCOSAL
  Filled 2022-11-15: qty 15

## 2022-11-15 MED ORDER — ORAL CARE MOUTH RINSE: 15.0000 mL | Freq: Once | OROMUCOSAL | Status: AC

## 2022-11-15 MED ORDER — LIDOCAINE 2% (20 MG/ML) 5 ML SYRINGE
INTRAMUSCULAR | Status: DC | PRN
  Administered 2022-11-15: 60 mg via INTRAVENOUS

## 2022-11-15 MED ORDER — FENTANYL CITRATE (PF) 250 MCG/5ML IJ SOLN
INTRAMUSCULAR | Status: DC | PRN
  Administered 2022-11-15 (×2): 50 ug via INTRAVENOUS

## 2022-11-15 MED ORDER — TRAMADOL HCL 50 MG PO TABS
50.0000 mg | ORAL_TABLET | Freq: Four times a day (QID) | ORAL | 0 refills | Status: DC | PRN
Start: 2022-11-15 — End: 2023-11-15

## 2022-11-15 MED ORDER — BUPIVACAINE-EPINEPHRINE 0.25% -1:200000 IJ SOLN
INTRAMUSCULAR | Status: DC | PRN
  Administered 2022-11-15: 17 mL

## 2022-11-15 MED ORDER — CHLORHEXIDINE GLUCONATE CLOTH 2 % EX PADS: 6.0000 | MEDICATED_PAD | Freq: Once | CUTANEOUS | Status: DC

## 2022-11-15 MED ORDER — GABAPENTIN 300 MG PO CAPS
300.0000 mg | ORAL_CAPSULE | ORAL | Status: DC
  Filled 2022-11-15: qty 1

## 2022-11-15 MED ORDER — ONDANSETRON HCL 4 MG/2ML IJ SOLN
INTRAMUSCULAR | Status: AC
  Filled 2022-11-15: qty 2

## 2022-11-15 MED ORDER — DEXAMETHASONE SODIUM PHOSPHATE 10 MG/ML IJ SOLN
INTRAMUSCULAR | Status: DC | PRN
  Administered 2022-11-15: 5 mg via INTRAVENOUS

## 2022-11-15 MED ORDER — ACETAMINOPHEN 500 MG PO TABS
1000.0000 mg | ORAL_TABLET | ORAL | Status: AC
  Administered 2022-11-15: 1000 mg via ORAL
  Filled 2022-11-15: qty 2

## 2022-11-15 MED ORDER — PROPOFOL 10 MG/ML IV BOLUS
INTRAVENOUS | Status: AC
  Filled 2022-11-15: qty 20

## 2022-11-15 MED ORDER — PROPOFOL 10 MG/ML IV BOLUS
INTRAVENOUS | Status: DC | PRN
  Administered 2022-11-15: 150 mg via INTRAVENOUS

## 2022-11-15 MED ORDER — HEPARIN 6000 UNIT IRRIGATION SOLUTION
Status: AC
  Filled 2022-11-15: qty 500

## 2022-11-15 MED ORDER — HEPARIN 6000 UNIT IRRIGATION SOLUTION
Status: DC | PRN
  Administered 2022-11-15: 1

## 2022-11-15 MED ORDER — HEPARIN SOD (PORK) LOCK FLUSH 100 UNIT/ML IV SOLN
INTRAVENOUS | Status: AC
  Filled 2022-11-15: qty 5

## 2022-11-15 MED ORDER — EPHEDRINE SULFATE-NACL 50-0.9 MG/10ML-% IV SOSY
PREFILLED_SYRINGE | INTRAVENOUS | Status: DC | PRN
  Administered 2022-11-15: 5 mg via INTRAVENOUS

## 2022-11-15 MED ORDER — DEXAMETHASONE SODIUM PHOSPHATE 10 MG/ML IJ SOLN
INTRAMUSCULAR | Status: AC
  Filled 2022-11-15: qty 1

## 2022-11-15 MED ORDER — LIDOCAINE 2% (20 MG/ML) 5 ML SYRINGE
INTRAMUSCULAR | Status: AC
  Filled 2022-11-15: qty 5

## 2022-11-15 MED ORDER — EPHEDRINE 5 MG/ML INJ
INTRAVENOUS | Status: AC
  Filled 2022-11-15: qty 5

## 2022-11-15 MED ORDER — BUPIVACAINE-EPINEPHRINE (PF) 0.25% -1:200000 IJ SOLN
INTRAMUSCULAR | Status: AC
  Filled 2022-11-15: qty 30

## 2022-11-15 MED ORDER — ONDANSETRON HCL 4 MG/2ML IJ SOLN
INTRAMUSCULAR | Status: DC | PRN
  Administered 2022-11-15: 4 mg via INTRAVENOUS

## 2022-11-15 MED ORDER — HEPARIN SOD (PORK) LOCK FLUSH 100 UNIT/ML IV SOLN
INTRAVENOUS | Status: DC | PRN
  Administered 2022-11-15: 500 [IU] via INTRAVENOUS

## 2022-11-15 SURGICAL SUPPLY — 40 items
ADH SKN CLS APL DERMABOND .7 (GAUZE/BANDAGES/DRESSINGS) ×1
APL PRP STRL LF DISP 70% ISPRP (MISCELLANEOUS) ×1
BAG COUNTER SPONGE SURGICOUNT (BAG) ×1 IMPLANT
BAG DECANTER FOR FLEXI CONT (MISCELLANEOUS) ×1 IMPLANT
BAG SPNG CNTER NS LX DISP (BAG) ×1
CHLORAPREP W/TINT 26 (MISCELLANEOUS) ×1 IMPLANT
COVER SURGICAL LIGHT HANDLE (MISCELLANEOUS) ×1 IMPLANT
COVER TRANSDUCER ULTRASND GEL (DISPOSABLE) ×1 IMPLANT
DERMABOND ADVANCED .7 DNX12 (GAUZE/BANDAGES/DRESSINGS) ×1 IMPLANT
DRAPE C-ARM 42X120 X-RAY (DRAPES) ×1 IMPLANT
ELECT CAUTERY BLADE 6.4 (BLADE) ×1 IMPLANT
ELECT REM PT RETURN 9FT ADLT (ELECTROSURGICAL) ×1
ELECTRODE REM PT RTRN 9FT ADLT (ELECTROSURGICAL) ×1 IMPLANT
GAUZE 4X4 16PLY ~~LOC~~+RFID DBL (SPONGE) ×1 IMPLANT
GEL ULTRASOUND 20GR AQUASONIC (MISCELLANEOUS) IMPLANT
GLOVE BIO SURGEON STRL SZ8 (GLOVE) ×1 IMPLANT
GLOVE BIOGEL PI IND STRL 8 (GLOVE) ×1 IMPLANT
GOWN STRL REUS W/ TWL LRG LVL3 (GOWN DISPOSABLE) ×1 IMPLANT
GOWN STRL REUS W/ TWL XL LVL3 (GOWN DISPOSABLE) ×1 IMPLANT
GOWN STRL REUS W/TWL LRG LVL3 (GOWN DISPOSABLE) ×1
GOWN STRL REUS W/TWL XL LVL3 (GOWN DISPOSABLE) ×1
INTRODUCER COOK 11FR (CATHETERS) IMPLANT
KIT BASIN OR (CUSTOM PROCEDURE TRAY) ×1 IMPLANT
KIT PORT INFUSION SMART 8FR (Port) IMPLANT
KIT TURNOVER KIT B (KITS) ×1 IMPLANT
NS IRRIG 1000ML POUR BTL (IV SOLUTION) ×1 IMPLANT
PAD ARMBOARD 7.5X6 YLW CONV (MISCELLANEOUS) ×1 IMPLANT
PENCIL BUTTON HOLSTER BLD 10FT (ELECTRODE) ×1 IMPLANT
POSITIONER HEAD DONUT 9IN (MISCELLANEOUS) ×1 IMPLANT
SET INTRODUCER 12FR PACEMAKER (INTRODUCER) IMPLANT
SET SHEATH INTRODUCER 10FR (MISCELLANEOUS) IMPLANT
SHEATH COOK PEEL AWAY SET 9F (SHEATH) IMPLANT
SUT MNCRL AB 4-0 PS2 18 (SUTURE) ×1 IMPLANT
SUT PROLENE 2 0 SH 30 (SUTURE) ×1 IMPLANT
SUT VIC AB 3-0 SH 27 (SUTURE) ×1
SUT VIC AB 3-0 SH 27X BRD (SUTURE) ×1 IMPLANT
SYR 5ML LUER SLIP (SYRINGE) ×1 IMPLANT
TOWEL GREEN STERILE (TOWEL DISPOSABLE) ×1 IMPLANT
TOWEL GREEN STERILE FF (TOWEL DISPOSABLE) ×1 IMPLANT
TRAY LAPAROSCOPIC MC (CUSTOM PROCEDURE TRAY) ×1 IMPLANT

## 2022-11-15 NOTE — Transfer of Care (Signed)
Immediate Anesthesia Transfer of Care Note  Patient: Jessica [name]  Procedure(s) Performed: PORT PLACEMENT WITH ULTRASOUND GUIDANCE  Patient Location: PACU  Anesthesia Type:General  Level of Consciousness: drowsy and patient cooperative  Airway & Oxygen Therapy: Patient Spontanous Breathing and Patient connected to face mask oxygen  Post-op Assessment: Report given to RN and Post -op Vital signs reviewed and stable  Post vital signs: Reviewed and stable  Last Vitals:  Vitals Value Taken Time  BP 113/50 11/15/22 0907  Temp    Pulse 65 11/15/22 0907  Resp 12 11/15/22 0907  SpO2 99 % 11/15/22 0907    Last Pain:  Vitals:   11/15/22 0613  TempSrc:   PainSc: 0-No pain      Patients Stated Pain Goal: 0 (11/15/22 7829)  Complications: No notable events documented.

## 2022-11-15 NOTE — Discharge Instructions (Signed)
PORT-A-CATH: POST OP INSTRUCTIONS  Always review your discharge instruction sheet given to you by the facility where your surgery was performed.   A prescription for pain medication may be given to you upon discharge. Take your pain medication as prescribed, if needed. If narcotic pain medicine is not needed, then you make take acetaminophen (Tylenol) or ibuprofen (Advil) as needed.  Take your usually prescribed medications unless otherwise directed. If you need a refill on your pain medication, please contact our office. All narcotic pain medicine now requires a paper prescription.  Phoned in and fax refills are no longer allowed by law.  Prescriptions will not be filled after 5 pm or on weekends.  You should follow a light diet for the remainder of the day after your procedure. Most patients will experience some mild swelling and/or bruising in the area of the incision. It may take several days to resolve. It is common to experience some constipation if taking pain medication after surgery. Increasing fluid intake and taking a stool softener (such as Colace) will usually help or prevent this problem from occurring. A mild laxative (Milk of Magnesia or Miralax) should be taken according to package directions if there are no bowel movements after 48 hours.  Unless discharge instructions indicate otherwise, you may remove your bandages 48 hours after surgery, and you may shower at that time. You may have steri-strips (small white skin tapes) in place directly over the incision.  These strips should be left on the skin for 7-10 days.  If your surgeon used Dermabond (skin glue) on the incision, you may shower in 24 hours.  The glue will flake off over the next 2-3 weeks.  If your port is left accessed at the end of surgery (needle left in port), the dressing cannot get wet and should only by changed by a healthcare professional. When the port is no longer accessed (when the needle has been removed), follow  step 7.   ACTIVITIES:  Limit activity involving your arms for the next 72 hours. Do no strenuous exercise or activity for 1 week. You may drive when you are no longer taking prescription pain medication, you can comfortably wear a seatbelt, and you can maneuver your car. 10.You may need to see your doctor in the office for a follow-up appointment.  Please       check with your doctor.  11.When you receive a new Port-a-Cath, you will get a product guide and        ID card.  Please keep them in case you need them.  WHEN TO CALL YOUR DOCTOR ([phone]): Fever over 101.0 Chills Continued bleeding from incision Increased redness and tenderness at the site Shortness of breath, difficulty breathing   The clinic staff is available to answer your questions during regular business hours. Please don't hesitate to call and ask to speak to one of the nurses or medical assistants for clinical concerns. If you have a medical emergency, go to the nearest emergency room or call 911.  A surgeon from [facility] is always on call at the hospital.     For further information, please visit [email or web address]      

## 2022-11-15 NOTE — Anesthesia Procedure Notes (Signed)
Procedure Name: LMA Insertion Date/Time: 11/15/2022 8:09 AM  Performed by: Earlene Plater, CRNAPre-anesthesia Checklist: Patient identified, Emergency Drugs available, Suction available, Patient being monitored and Timeout performed Patient Re-evaluated:Patient Re-evaluated prior to induction Preoxygenation: Pre-oxygenation with 100% oxygen Induction Type: IV induction Ventilation: Mask ventilation without difficulty LMA: LMA inserted LMA Size: 4.0 Number of attempts: 1 Placement Confirmation: ETT inserted through vocal cords under direct vision, positive ETCO2, breath sounds checked- equal and bilateral and CO2 detector Tube secured with: Tape Dental Injury: Teeth and Oropharynx as per pre-operative assessment

## 2022-11-15 NOTE — H&P (Signed)
Jessica [name] is an 82 y.o. female.   Chief Complaint: breast cancer HPI: pt presents for venous acces for chemotherapy   Past Medical History:  Diagnosis Date   Anemia 12/2015   Bronchitis, chronic (HCC)    in her 8s   Cataract    Chronic kidney disease    in 20s had nephritis   Closed fracture of right distal humerus    right   Coronary artery disease    Hand, foot and mouth disease (HFMD)    Hearing loss 05/03/2016   High cholesterol    Hypothyroidism    Myocardial infarction (HCC)    Osteopenia    Pericarditis 01/1999   Pneumothorax 01/1999   Thyroid disorder    Vitamin B 12 deficiency     Past Surgical History:  Procedure Laterality Date   birthmark      removed,left leg   BREAST BIOPSY Left 09/13/2022   [place] LT BREAST BX W LOC DEV 1ST LESION IMG BX SPEC US GUIDE 09/13/2022 [facility]   CARDIAC CATHETERIZATION     CATARACT EXTRACTION  2017   CORONARY STENT INTERVENTION N/A 09/01/2020   Procedure: CORONARY STENT INTERVENTION;  Surgeon: Lyn Records, MD;  Location: [facility];  Service: Cardiovascular;  Laterality: N/A;   LEFT HEART CATH AND CORONARY ANGIOGRAPHY N/A 09/01/2020   Procedure: LEFT HEART CATH AND CORONARY ANGIOGRAPHY;  Surgeon: Lyn Records, MD;  Location: [facility];  Service: Cardiovascular;  Laterality: N/A;   ORIF HUMERUS FRACTURE Right 04/10/2019   Procedure: RIGHT OPEN REDUCTION INTERNAL FIXATION (ORIF) DISTAL HUMERUS FRACTURE WITH EXTENSION;  Surgeon: Bjorn Pippin, MD;  Location: [facility];  Service: Orthopedics;  Laterality: Right;   SIMPLE MASTECTOMY WITH AXILLARY SENTINEL NODE BIOPSY Bilateral 10/18/2022   Procedure: BILATERAL SIMPLE MASTECTOMY;  Surgeon: Harriette Bouillon, MD;  Location: [facility];  Service: General;  Laterality: Bilateral;  PEC BLOCK   TONSILLECTOMY AND ADENOIDECTOMY  1951?    Family History  Problem Relation Age of Onset   Diabetes Mother    Asthma Mother    Ataxia  Father    Stroke Father    Cancer - Cervical Sister    Thyroid disease Sister    Diabetes Maternal Grandmother    Arthritis Paternal Grandmother    Diabetes Other    Colon cancer Neg Hx    Breast cancer Neg Hx    Social History:  reports that she has never smoked. She has never used smokeless tobacco. She reports current alcohol use of about 3.0 standard drinks of alcohol per week. She reports that she does not use drugs.  Allergies: No Known Allergies  Medications Prior to Admission  Medication Sig Dispense Refill   acetaminophen (TYLENOL) 650 MG CR tablet Take 650 mg by mouth as needed for pain.     ascorbic acid (VITAMIN C) 500 MG tablet Take 500 mg by mouth daily.     Calcium Carb-Cholecalciferol (CALCIUM 600+D) 600-800 MG-UNIT TABS Take 3 tablets by mouth daily.     clopidogrel (PLAVIX) 75 MG tablet TAKE 1 TABLET BY MOUTH DAILY 90 tablet 2   ferrous sulfate 325 (65 FE) MG tablet Take 325 mg by mouth daily with breakfast.     gabapentin (NEURONTIN) 100 MG capsule Take 100 mg by mouth daily.     levothyroxine (SYNTHROID) 112 MCG tablet Take 1 tablet (112 mcg total) by mouth daily. 90 tablet 3   loratadine (CLARITIN) 10 MG tablet Take  10 mg by mouth as needed for allergies.     melatonin 5 MG TABS Take 5 mg by mouth at bedtime.     nitroGLYCERIN (NITROSTAT) 0.4 MG SL tablet TAKE 1 TABLET UNDER THE TONGUE EVERY 5 MINUTES AS NEEDED FOR CHEST PAIN (Patient taking differently: Place 0.4 mg under the tongue every 5 (five) minutes as needed for chest pain.) 25 tablet 6   rosuvastatin (CRESTOR) 20 MG tablet TAKE 1 TABLET BY MOUTH DAILY 30 tablet 6   Study - [name] - olpasiran (AMG 890) 142 mg/mL or placebo SQ injection (PI-[facility]) Inject 142 mg into the skin once. For investigational use only - Inject 1 mL (1 prefilled syringe) subcutaneously into appropriate injection site per protocol. (Approved injection site(s): upper arm, upper thigh, abdomen). Please contact [facility]  if any questions.     VITAMIN D PO Take 1 capsule by mouth daily.     denosumab (PROLIA) 60 MG/ML SOSY injection Inject 60 mg into the skin every 6 (six) months.     ondansetron (ZOFRAN) 8 MG tablet Take 1 tablet (8 mg total) by mouth every 8 (eight) hours as needed for nausea or vomiting. Start on the third day after chemotherapy. (Patient not taking: Reported on 11/09/2022) 30 tablet 1   oxyCODONE (OXY IR/ROXICODONE) 5 MG immediate release tablet Take 1 tablet (5 mg total) by mouth every 6 (six) hours as needed for severe pain. (Patient not taking: Reported on 11/03/2022) 15 tablet 0   prochlorperazine (COMPAZINE) 10 MG tablet Take 1 tablet (10 mg total) by mouth every 6 (six) hours as needed for nausea or vomiting. 30 tablet 1    No results found for this or any previous visit (from the past 48 hour(s)). No results found.  Review of Systems  All other systems reviewed and are negative.   Blood pressure (!) 121/90, pulse (!) 55, temperature 98.1 F (36.7 C), temperature source Oral, resp. rate 17, height 5\' 3"  (1.6 m), weight 69.4 kg, SpO2 96%. Physical Exam Chest:       Comments: Bilateral mastectomy wounds  Skin:    General: Skin is warm.  Neurological:     General: No focal deficit present.     Mental Status: She is alert.  Psychiatric:        Mood and Affect: Mood normal.      Assessment/Plan Poor venous access Place port for chemo Risk  discussed  The procedure has been discussed with the patient.  Alternative therapies have been discussed with the patient.  Operative risks include bleeding,  Infection,  Organ injury,  Nerve injury,  Blood vessel injury,  DVT,  Pulmonary embolism,  collapselung, organ injury  Death,  And possible reoperation.  Medical management risks include worsening of present situation.  The success of the procedure is 50 -90 % at treating patients symptoms.  The patient understands and agrees to proceed.   Dortha Schwalbe, MD 11/15/2022, 7:31  AM

## 2022-11-15 NOTE — Interval H&P Note (Signed)
History and Physical Interval Note:  11/15/2022 7:33 AM  Jessica [name]  has presented today for surgery, with the diagnosis of POOR VENOUS ACCESS.  The various methods of treatment have been discussed with the patient and family. After consideration of risks, benefits and other options for treatment, the patient has consented to  Procedure(s): PORT PLACEMENT WITH ULTRASOUND GUIDANCE (N/A) as a surgical intervention.  The patient's history has been reviewed, patient examined, no change in status, stable for surgery.  I have reviewed the patient's chart and labs.  Questions were answered to the patient's satisfaction.     [name] A Harrie Cazarez

## 2022-11-15 NOTE — Op Note (Signed)
Preoperative diagnosis: PAC needed for chemotherapy   Postoperative diagnosis: Same  Procedure: Portacath Placement with C arm and U/S guidance   Surgeon: Turner Daniels, MD, FACS  Anesthesia: General and 0.25 % marcaine with epinephrine  Clinical History and Indications: The patient is getting ready to begin chemotherapy for her cancer. She  needs a Port-A-Cath for venous access. Risk of bleeding, infection,  Collapse lung,  Death,  DVT,  Organ injury,  Mediastinal injury,  Injury to heart,  Injury to blood vessels,  Nerves,  Migration of catheter,  Embolization of catheter and the need for more surgery.  Description of Procedure: I have seen the patient in the holding area and confirmed the plans for the procedure as noted above. I reviewed the risks and complications again and the patient has no further questions. She wishes to proceed.   The patient was then taken to the operating room. After satisfactory general  anesthesia had been obtained the upper chest and lower neck were prepped and draped as a sterile field. The timeout was done.  The right internal jugular vein  was entered under U/S guidance  and the guidewire threaded into the superior vena cava right atrial area under fluoroscopic guidance. An incision was then made on the anterior chest wall and a subcutaneous pocket fashioned for the port reservoir.  The port tubing was then brought through a subcutaneous tunnel from the port site to the guidewire site.  The port and catheter were attached, locked  and flushed. The catheter was measured and cut to appropriate length.The dilator and peel-away sheath were then advanced over the guidewire while monitoring this with fluoroscopy. The guidewire and dilator were removed and the tubing threaded to approximately 20  cm. The peel-away sheath was then removed. The catheter aspirated and flushed easily. Using fluoroscopy the tip was in the superior vena cava right atrial junction area. It  aspirated and flushed easily. That aspirated and flushed easily.  The reservoir was secured to the fascia with 1 sutures of 2-0 Prolene. A final check with fluoroscopy was done to make sure we had no kinks and good positioning of the tip of the catheter. Everything appeared to be okay. The catheter was aspirated, flushed with dilute heparin and then concentrated aqueous heparin.  The incision was then closed with interrupted 3-0 Vicryl, and 4-0 Monocryl subcuticular with Dermabond on the skin.  There were no operative complications. Estimated blood loss was minimal. All counts were correct. The patient tolerated the procedure well.  Turner Daniels, MD, FACS

## 2022-11-15 NOTE — Anesthesia Preprocedure Evaluation (Addendum)
Anesthesia Evaluation  Patient identified by MRN, date of birth, ID band Patient awake    Reviewed: Allergy & Precautions, NPO status , Patient's Chart, lab work & pertinent test results  Airway Mallampati: II  TM Distance: >3 FB Neck ROM: Full    Dental  (+) Dental Advisory Given, Chipped,    Pulmonary neg pulmonary ROS   breath sounds clear to auscultation       Cardiovascular + CAD, + Past MI and + Cardiac Stents  Normal cardiovascular exam Rhythm:Regular Rate:Normal  Echo 08/2022  1. Left ventricular ejection fraction, by estimation, is 60 to 65%. The left ventricle has normal function. The left ventricle has no regional wall motion abnormalities. Left ventricular diastolic parameters were normal.   2. Right ventricular systolic function is low normal. The right ventricular size is normal. There is normal pulmonary artery systolic pressure. The estimated right ventricular systolic pressure is 23.6 mmHg.   3. The mitral valve is normal in structure. Trivial mitral valve regurgitation. No evidence of mitral stenosis.   4. The aortic valve was not well visualized. Aortic valve regurgitation is trivial. No aortic stenosis is present.   5. The inferior vena cava is normal in size with greater than 50% respiratory variability, suggesting right atrial pressure of 3 mmHg.     Neuro/Psych negative neurological ROS     GI/Hepatic negative GI ROS, Neg liver ROS,,,  Endo/Other  Hypothyroidism    Renal/GU Renal disease     Musculoskeletal negative musculoskeletal ROS (+)    Abdominal   Peds  Hematology  (+) Blood dyscrasia (Plavix)   Anesthesia Other Findings POOR VENOUS ACCESS  Reproductive/Obstetrics                             Anesthesia Physical Anesthesia Plan  ASA: 3  Anesthesia Plan: General   Post-op Pain Management: Tylenol PO (pre-op)*   Induction:   PONV Risk Score and Plan: 3 and  Ondansetron, Treatment may vary due to age or medical condition and Dexamethasone  Airway Management Planned: LMA  Additional Equipment:   Intra-op Plan:   Post-operative Plan: Extubation in OR  Informed Consent: I have reviewed the patients History and Physical, chart, labs and discussed the procedure including the risks, benefits and alternatives for the proposed anesthesia with the patient or authorized representative who has indicated his/her understanding and acceptance.     Dental advisory given  Plan Discussed with: CRNA  Anesthesia Plan Comments:        Anesthesia Quick Evaluation

## 2022-11-15 NOTE — Progress Notes (Signed)
Patient ready for discharge, placed in phase 2, awaiting CXR results before discharge.

## 2022-11-16 ENCOUNTER — Encounter: Payer: Self-pay | Admitting: *Deleted

## 2022-11-16 ENCOUNTER — Encounter (HOSPITAL_COMMUNITY): Payer: Self-pay | Admitting: Surgery

## 2022-11-16 ENCOUNTER — Inpatient Hospital Stay: Payer: Medicare Other | Attending: Hematology and Oncology

## 2022-11-16 ENCOUNTER — Inpatient Hospital Stay: Payer: Medicare Other | Admitting: Pharmacist

## 2022-11-16 DIAGNOSIS — Z5111 Encounter for antineoplastic chemotherapy: Secondary | ICD-10-CM | POA: Insufficient documentation

## 2022-11-16 DIAGNOSIS — Z9012 Acquired absence of left breast and nipple: Secondary | ICD-10-CM | POA: Diagnosis not present

## 2022-11-16 DIAGNOSIS — C50412 Malignant neoplasm of upper-outer quadrant of left female breast: Secondary | ICD-10-CM | POA: Insufficient documentation

## 2022-11-16 DIAGNOSIS — Z171 Estrogen receptor negative status [ER-]: Secondary | ICD-10-CM | POA: Diagnosis not present

## 2022-11-16 NOTE — Anesthesia Postprocedure Evaluation (Signed)
Anesthesia Post Note  Patient: Jessica [name]  Procedure(s) Performed: PORT PLACEMENT WITH ULTRASOUND GUIDANCE     Patient location during evaluation: PACU Anesthesia Type: General Level of consciousness: awake Pain management: pain level controlled Vital Signs Assessment: post-procedure vital signs reviewed and stable Respiratory status: spontaneous breathing, nonlabored ventilation and respiratory function stable Cardiovascular status: blood pressure returned to baseline and stable Postop Assessment: no apparent nausea or vomiting Anesthetic complications: no   No notable events documented.  Last Vitals:  Vitals:   11/15/22 0922 11/15/22 0937  BP: (!) 140/86 134/71  Pulse: 61 61  Resp: 12 14  Temp:  (!) 36.3 C  SpO2: 97% 93%    Last Pain:  Vitals:   11/15/22 0907  TempSrc:   PainSc: Asleep                 Aleynah Rocchio P Laquandra Carrillo

## 2022-11-16 NOTE — Progress Notes (Signed)
Pharmacist Chemotherapy Monitoring - Initial Assessment    Anticipated start date: 11/23/22   The following has been reviewed per standard work regarding the patient's treatment regimen: The patient's diagnosis, treatment plan and drug doses, and organ/hematologic function Lab orders and baseline tests specific to treatment regimen  The treatment plan start date, drug sequencing, and pre-medications Prior authorization status  Patient's documented medication list, including drug-drug interaction screen and prescriptions for anti-emetics and supportive care specific to the treatment regimen The drug concentrations, fluid compatibility, administration routes, and timing of the medications to be used The patient's access for treatment and lifetime cumulative dose history, if applicable  The patient's medication allergies and previous infusion related reactions, if applicable   Changes made to treatment plan:  N/A  Follow up needed:  N/A   Jessica [name], RPH, 11/16/2022  3:26 PM

## 2022-11-16 NOTE — Progress Notes (Signed)
[facility]       Telephone: [phone]?Fax: [phone]   Oncology Clinical Pharmacist Practitioner Initial Assessment  Jessica [name] is a 82 y.o. female with a diagnosis of breast cancer. They were contacted today via in-person visit. She is accompanied by her friend Jessica [name].  Indication/Regimen CMF: cyclophosphamide, methotrexate, fluorouracil are being used appropriately for treatment of breast cancer by Dr. Serena Croissant.      Wt Readings from Last 1 Encounters:  11/15/22 153 lb (69.4 kg)    Estimated body surface area is 1.76 meters squared as calculated from the following:   Height as of 11/15/22: 5\' 3"  (1.6 m).   Weight as of 11/15/22: 153 lb (69.4 kg).  The dosing regimen is every 21 days for 6 cycles  Cyclophosphamide (600 mg/m2) on Day 1 Methotrexate (40 mg/m2) on Day 1 Fluorouracil (600 mg/m2) on Day 1  Dose Modifications None at this time   Allergies No Known Allergies  Vitals: No vitals or labs were done today for this chemotherapy education visit   Contraindications Contraindications were reviewed? Yes Contraindications to therapy were identified? No   Safety Precautions The following safety precautions for the use of CMF were reviewed:  Fever: reviewed the importance of having a thermometer and the Centers for Disease Control and Prevention (CDC) definition of fever which is 100.85F (38C) or higher. Patient should call 24/7 triage at [phone] if experiencing a fever or any other symptoms Decreased hemoglobin Decreased platelets Decreased white blood cells Fatigue Diarrhea Mucositis Nausea Vomiting Alopecia Skin reactions Kidney injury Liver injury Cardiotoxicity Secondary malignancies Drug-Drug interactions Conditions such as pleural effusions, ascites VTE Pneumonitis Importance of drinking plenty of fluids Handling body fluids and waste Intimacy, sexual activity, contraception, and fertility if  applicable  Medication Reconciliation No current facility-administered medications for this visit.   Current Outpatient Medications  Medication Sig Dispense Refill   lidocaine-prilocaine (EMLA) cream Apply to affected area once (Patient not taking: Reported on 11/16/2022)     Facility-Administered Medications Ordered in Other Visits  Medication Dose Route Frequency Provider Last Rate Last Admin   Chlorhexidine Gluconate Cloth 2 % PADS 6 each  6 each Topical Once Cornett, Thomas, MD       And   Chlorhexidine Gluconate Cloth 2 % PADS 6 each  6 each Topical Once Cornett, Maisie Fus, MD       lactated ringers infusion   Intravenous Continuous Beryle Lathe, MD   New Bag at 11/15/22 [phone]    Medication reconciliation is based on the patient's most recent medication list in the electronic medical record (EMR) including herbal products and OTC medications.   The patient's medication list was reviewed today with the patient? Yes   Drug-drug interactions (DDIs) DDIs were evaluated? Yes Significant DDIs identified? No   Drug-Food Interactions Drug-food interactions were evaluated? No Drug-food interactions identified? No   Follow-up Plan  Treatment start date: 11/23/22 Port placement date: 11/15/22 We reviewed the prescriptions, premedications, and treatment regimen with the patient. Possible side effects of the treatment regimen were reviewed and management strategies were discussed. Clinical pharmacy will assist Dr. Serena Croissant and Julius Bowels on an as needed basis going forward  Julius Bowels participated in the discussion, expressed understanding, and voiced agreement with the above plan. All questions were answered to her satisfaction. The patient was advised to contact the clinic at [phone] with any questions or concerns prior to her return visit.   I spent 60 minutes  assessing the patient.  Haseeb Fiallos A. Odetta Pink, PharmD, BCOP, CPP  Anselm Lis, RPH-CPP, 11/16/2022 3:29  PM  **Disclaimer: This note was dictated with voice recognition software. Similar sounding words can inadvertently be transcribed and this note may contain transcription errors which may not have been corrected upon publication of note.**

## 2022-11-17 ENCOUNTER — Ambulatory Visit: Payer: Medicare Other | Admitting: Pharmacist

## 2022-11-17 ENCOUNTER — Other Ambulatory Visit: Payer: Medicare Other

## 2022-11-17 ENCOUNTER — Encounter: Payer: Self-pay | Admitting: Dietician

## 2022-11-17 NOTE — Progress Notes (Signed)
Patient participated in the Nutrition and Cancer webinar. AICR guidelines were reviewed.  [facility] nutrition resources for survivors were encouraged (Nutrition 101, cooking classes, individual MNT with outpatient RDs in weight management and DM management).   Emailed Plant based protein sources, Soy & Breast Cancer, resource websites for [employer] and contact information provided.  Gennaro Africa, RDN, LDN Registered Dietitian, [facility] Part Time Remote (Usual office hours: Tuesday-Thursday) Mobile: [phone] Remote Office: [phone]

## 2022-11-18 DIAGNOSIS — Z23 Encounter for immunization: Secondary | ICD-10-CM | POA: Diagnosis not present

## 2022-11-21 ENCOUNTER — Ambulatory Visit: Payer: Medicare Other

## 2022-11-21 DIAGNOSIS — C50412 Malignant neoplasm of upper-outer quadrant of left female breast: Secondary | ICD-10-CM | POA: Diagnosis not present

## 2022-11-21 DIAGNOSIS — R293 Abnormal posture: Secondary | ICD-10-CM

## 2022-11-21 DIAGNOSIS — M25612 Stiffness of left shoulder, not elsewhere classified: Secondary | ICD-10-CM | POA: Diagnosis not present

## 2022-11-21 DIAGNOSIS — M25611 Stiffness of right shoulder, not elsewhere classified: Secondary | ICD-10-CM | POA: Diagnosis not present

## 2022-11-21 DIAGNOSIS — Z171 Estrogen receptor negative status [ER-]: Secondary | ICD-10-CM | POA: Diagnosis not present

## 2022-11-21 DIAGNOSIS — Z483 Aftercare following surgery for neoplasm: Secondary | ICD-10-CM | POA: Diagnosis not present

## 2022-11-21 NOTE — Therapy (Signed)
OUTPATIENT PHYSICAL THERAPY BREAST CANCER TREATMENT   Patient Name: Jessica [name] MRN: [id number] DOB:08-03-1940, 82 y.o., female Today's Date: 11/21/2022  END OF SESSION:  PT End of Session - 11/21/22 1507     Visit Number 3    Number of Visits 10    Date for PT Re-Evaluation 12/12/22    PT Start Time 1505    PT Stop Time 1600    PT Time Calculation (min) 55 min    Activity Tolerance Patient tolerated treatment well    Behavior During Therapy [facility] for tasks assessed/performed             Past Medical History:  Diagnosis Date   Anemia 12/2015   Bronchitis, chronic (HCC)    in her 23s   Cataract    Chronic kidney disease    in 20s had nephritis   Closed fracture of right distal humerus    right   Coronary artery disease    Hand, foot and mouth disease (HFMD)    Hearing loss 05/03/2016   High cholesterol    Hypothyroidism    Myocardial infarction (HCC)    Osteopenia    Pericarditis 01/1999   Pneumothorax 01/1999   Thyroid disorder    Vitamin B 12 deficiency    Past Surgical History:  Procedure Laterality Date   birthmark      removed,left leg   BREAST BIOPSY Left 09/13/2022   [place] LT BREAST BX W LOC DEV 1ST LESION IMG BX SPEC US GUIDE 09/13/2022 [facility]   CARDIAC CATHETERIZATION     CATARACT EXTRACTION  2017   CORONARY STENT INTERVENTION N/A 09/01/2020   Procedure: CORONARY STENT INTERVENTION;  Surgeon: Lyn Records, MD;  Location: [facility];  Service: Cardiovascular;  Laterality: N/A;   LEFT HEART CATH AND CORONARY ANGIOGRAPHY N/A 09/01/2020   Procedure: LEFT HEART CATH AND CORONARY ANGIOGRAPHY;  Surgeon: Lyn Records, MD;  Location: [facility];  Service: Cardiovascular;  Laterality: N/A;   ORIF HUMERUS FRACTURE Right 04/10/2019   Procedure: RIGHT OPEN REDUCTION INTERNAL FIXATION (ORIF) DISTAL HUMERUS FRACTURE WITH EXTENSION;  Surgeon: Bjorn Pippin, MD;  Location: [facility];  Service: Orthopedics;  Laterality:  Right;   PORTACATH PLACEMENT N/A 11/15/2022   Procedure: PORT PLACEMENT WITH ULTRASOUND GUIDANCE;  Surgeon: Harriette Bouillon, MD;  Location: [facility];  Service: General;  Laterality: N/A;   SIMPLE MASTECTOMY WITH AXILLARY SENTINEL NODE BIOPSY Bilateral 10/18/2022   Procedure: BILATERAL SIMPLE MASTECTOMY;  Surgeon: Harriette Bouillon, MD;  Location: [facility];  Service: General;  Laterality: Bilateral;  PEC BLOCK   TONSILLECTOMY AND ADENOIDECTOMY  1952?   Patient Active Problem List   Diagnosis Date Noted   Malignant neoplasm of upper-outer quadrant of left breast in female, estrogen receptor negative (HCC) 09/21/2022   Cancer determined by biopsy of breast (HCC) 09/12/2022   Atherosclerotic heart disease of native coronary artery with other forms of angina pectoris (HCC) 04/08/2022   Swelling of left foot 04/07/2022   Left hand fracture, sequela 04/07/2022   Depression with anxiety 04/07/2022   NSTEMI (non-ST elevated myocardial infarction) (HCC) 09/01/2020   Closed fracture of right distal humerus 04/10/2019   Dyspnea 05/09/2018   Vitamin B 12 deficiency 05/09/2018   Cough 03/06/2018   Senile osteoporosis 11/20/2017   Iron deficiency 04/26/2017   Lichen sclerosus 04/26/2017   External hemorrhoid 04/26/2017   Weakness of left leg 06/07/2016   Balance disorder 05/24/2016   Memory deficit 05/24/2016  Anemia 05/19/2016   Hearing loss 05/03/2016   Hypothyroidism 07/24/2013   Bradycardia on ECG 07/24/2013   Numbness 08/07/2012   HYPERLIPIDEMIA 12/29/2006    REFERRING PROVIDER: Dr. Harriette Bouillon  REFERRING DIAG: Left breast cancer  THERAPY DIAG:  Malignant neoplasm of upper-outer quadrant of left breast in female, estrogen receptor negative (HCC)  Abnormal posture  Aftercare following surgery for neoplasm  Stiffness of right shoulder, not elsewhere classified  Stiffness of left shoulder, not elsewhere classified  Rationale for Evaluation and Treatment:  Rehabilitation  ONSET DATE: 10/18/2022  SUBJECTIVE:                                                                                                                                                                                           SUBJECTIVE STATEMENT: I've been doing the exercises Dorothyann Gibbs gave me last time and they are going fine. I saw the surgeon Friday and everything seems to be healing well.   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 She underwent a bilateral mastectomy and left sentinel node biopsy (7 negative nodes) on 10/18/2022. It is triple negative with a Ki67 of 5%. She has some cardiac history and had an elbow fracture in 2022 resulting in surgery where pins were placed.   PATIENT GOALS:  Reassess how my recovery is going related to arm function, pain, and swelling.  PAIN:  Are you having pain? Yes: NPRS scale: 1/10 Pain location: Chest / breast bone Pain description: hard to describe Aggravating factors: worse in the morning Relieving factors: sleeping  PRECAUTIONS: Recent Surgery, left UE Lymphedema risk  RED FLAGS: None   ACTIVITY LEVEL / LEISURE: She is walking half to 1 mile daily   OBJECTIVE:   PATIENT SURVEYS:  QUICK DASH:     OBSERVATIONS: Bilateral incisions appear to be healing well. There is skin overlap and edema present with tissue on the lateral aspects on her chest.  POSTURE:  Forward head and rounded shoulders   LYMPHEDEMA ASSESSMENT:   UPPER EXTREMITY AROM/PROM:   A/PROM RIGHT   eval   RIGHT 11/14/2022  Shoulder extension 63 60  Shoulder flexion 128 110  Shoulder abduction 137 136  Shoulder internal rotation 68 75  Shoulder external rotation 73 75                          (Blank rows = not tested)   A/PROM LEFT   eval LEFT 11/14/2022  Shoulder extension 53 49  Shoulder flexion 135 109  Shoulder abduction 146 114  Shoulder internal rotation 74  77   Shoulder external rotation 78 70                          (Blank rows = not tested)     UPPER EXTREMITY STRENGTH: WFL   LYMPHEDEMA ASSESSMENTS: NOT ASSESSED AS PT IS NOT HAVING ANY LYMPH NODES REMOVED   LANDMARK RIGHT   eval RIGHT 11/14/2022  10 cm proximal to olecranon process  Not entered due pt was not originally having nodes removed 27.6  Olecranon process   24.3  10 cm proximal to ulnar styloid process   20.1  Just proximal to ulnar styloid process   15.1  Across hand at thumb web space   16.7  At base of 2nd digit   6.1  (Blank rows = not tested)   LANDMARK LEFT   eval LEFT 11/14/2022  10 cm proximal to olecranon process   Not entered due pt was not originally having nodes removed 26.4  Olecranon process   23.4  10 cm proximal to ulnar styloid process   19.5  Just proximal to ulnar styloid process   13.9  Across hand at thumb web space   16.4  At base of 2nd digit   6.1  (Blank rows = not tested) Surgery type/Date: 10/18/2022 Number of lymph nodes removed: 7 Current/past treatment (chemo, radiation, hormone therapy): none Other symptoms:  Heaviness/tightness No Pain Yes Pitting edema No Infections No Decreased scar mobility Yes Stemmer sign No   TODAY'S TREATMENT: Date: 11/21/22: Manual Therapy P/ROM to bil shoulders into flexion, abd and D2 to pts tolerance and with scapular depression by therapist throughout MFR to Lt UE at area of cording from axilla to medial upper arm MLD: Rt inguinal nodes, Rt axillo-inguinal anastomosis and then focused on edema inferior to Rt mastectomy instructing pt in this and having her return demo Therapeutic Exercises Pulleys into flex x 2:30 mins with VC's during to decrease scapular compensation  PATIENT EDUCATION:  Education details: HEP Person educated: Patient Education method: Medical illustrator Education comprehension: verbalized understanding and returned demonstration  HOME EXERCISE PROGRAM: Reviewed  previously given post op HEP.  ASSESSMENT:  CLINICAL IMPRESSION: First session of manual therapy working to decrease bil upper quadrant tightness and improve her end P/ROM. Also included Rt lateral trunk MLD and instructed pt in this and that she could do the same for the Lt side. Then ended session with pulleys to focus on instruction of decreasing scapular compensations.   Pt will benefit from skilled therapeutic intervention to improve on the following deficits: Decreased knowledge of precautions, impaired UE functional use, pain, decreased ROM, postural dysfunction.   PT treatment/interventions: ADL/Self care home management, Therapeutic exercises, Therapeutic activity, Patient/Family education, Self Care, Manual lymph drainage, scar mobilization, Manual therapy, and Re-evaluation   GOALS: Goals reviewed with patient? Yes  LONG TERM GOALS:  (STG=LTG)  GOALS Name Target Date  Goal status  1 Pt will demonstrate she has regained full shoulder ROM and function post operatively compared to baselines.  Baseline: 12/12/2022 IN PROGRESS  2 Patient will report >/= 50% less pain and sensitivity in her chest midline to tolerate daily tasks with greater ease 12/12/2022 INITIAL  3 Patient will improve her DASH score to be back to baseline (4.55) for improved overall UE function. 12/12/2022 INITIAL  4 Patient will report good understanding of lymphedema risk reduction practices. 12/12/2022 INITIAL     PLAN:  PT FREQUENCY/DURATION: 2x/week for 4 weeks  PLAN FOR  NEXT SESSION: Cont ROM exercises bil shoulders; begin scar massage when fully healed (2-3 more weeks ~9/30))   [facility]  [address]  [place] [address]  [phone]    Hermenia Bers, PTA 11/21/2022, 4:10 PM

## 2022-11-22 MED FILL — Dexamethasone Sodium Phosphate Inj 100 MG/10ML: INTRAMUSCULAR | Qty: 1 | Status: AC

## 2022-11-23 ENCOUNTER — Inpatient Hospital Stay: Payer: Medicare Other

## 2022-11-23 VITALS — BP 143/75 | HR 61 | Temp 97.7°F | Resp 16 | Wt 152.2 lb

## 2022-11-23 DIAGNOSIS — C50412 Malignant neoplasm of upper-outer quadrant of left female breast: Secondary | ICD-10-CM | POA: Diagnosis not present

## 2022-11-23 DIAGNOSIS — Z171 Estrogen receptor negative status [ER-]: Secondary | ICD-10-CM

## 2022-11-23 DIAGNOSIS — Z9012 Acquired absence of left breast and nipple: Secondary | ICD-10-CM | POA: Diagnosis not present

## 2022-11-23 DIAGNOSIS — Z5111 Encounter for antineoplastic chemotherapy: Secondary | ICD-10-CM | POA: Diagnosis not present

## 2022-11-23 LAB — CMP (CANCER CENTER ONLY)
ALT: 14 U/L (ref 0–44)
AST: 14 U/L — ABNORMAL LOW (ref 15–41)
Albumin: 3.9 g/dL (ref 3.5–5.0)
Alkaline Phosphatase: 64 U/L (ref 38–126)
Anion gap: 6 (ref 5–15)
BUN: 17 mg/dL (ref 8–23)
CO2: 27 mmol/L (ref 22–32)
Calcium: 9.1 mg/dL (ref 8.9–10.3)
Chloride: 106 mmol/L (ref 98–111)
Creatinine: 0.6 mg/dL (ref 0.44–1.00)
GFR, Estimated: >60 mL/min (ref >60)
Glucose, Bld: 98 mg/dL (ref 70–99)
Potassium: 4.1 mmol/L (ref 3.5–5.1)
Sodium: 139 mmol/L (ref 135–145)
Total Bilirubin: 0.4 mg/dL (ref 0.3–1.2)
Total Protein: 6.4 g/dL — ABNORMAL LOW (ref 6.5–8.1)

## 2022-11-23 LAB — CBC WITH DIFFERENTIAL (CANCER CENTER ONLY)
Abs Immature Granulocytes: 0.07 10*3/uL (ref 0.00–0.07)
Basophils Absolute: 0.1 10*3/uL (ref 0.0–0.1)
Basophils Relative: 1 %
Eosinophils Absolute: 0.2 10*3/uL (ref 0.0–0.5)
Eosinophils Relative: 2 %
HCT: 36.7 % (ref 36.0–46.0)
Hemoglobin: 12.5 g/dL (ref 12.0–15.0)
Immature Granulocytes: 1 %
Lymphocytes Relative: 27 %
Lymphs Abs: 2.6 10*3/uL (ref 0.7–4.0)
MCH: 32.8 pg (ref 26.0–34.0)
MCHC: 34.1 g/dL (ref 30.0–36.0)
MCV: 96.3 fL (ref 80.0–100.0)
Monocytes Absolute: 0.6 10*3/uL (ref 0.1–1.0)
Monocytes Relative: 7 %
Neutro Abs: 5.8 10*3/uL (ref 1.7–7.7)
Neutrophils Relative %: 62 %
Platelet Count: 245 10*3/uL (ref 150–400)
RBC: 3.81 MIL/uL — ABNORMAL LOW (ref 3.87–5.11)
RDW: 12.9 % (ref 11.5–15.5)
WBC Count: 9.3 10*3/uL (ref 4.0–10.5)
nRBC: 0 % (ref 0.0–0.2)

## 2022-11-23 MED ORDER — SODIUM CHLORIDE 0.9 % IV SOLN: Freq: Once | INTRAVENOUS | Status: AC

## 2022-11-23 MED ORDER — FLUOROURACIL CHEMO INJECTION 2.5 GM/50ML
600.0000 mg/m2 | Freq: Once | INTRAVENOUS | Status: AC
  Administered 2022-11-23: 1000 mg via INTRAVENOUS
  Filled 2022-11-23: qty 20

## 2022-11-23 MED ORDER — SODIUM CHLORIDE 0.9 % IV SOLN
10.0000 mg | Freq: Once | INTRAVENOUS | Status: AC
  Administered 2022-11-23: 10 mg via INTRAVENOUS
  Filled 2022-11-23: qty 10

## 2022-11-23 MED ORDER — HEPARIN SOD (PORK) LOCK FLUSH 100 UNIT/ML IV SOLN: 500.0000 [IU] | Freq: Once | INTRAVENOUS | Status: DC | PRN

## 2022-11-23 MED ORDER — SODIUM CHLORIDE 0.9 % IV SOLN
600.0000 mg/m2 | Freq: Once | INTRAVENOUS | Status: AC
  Administered 2022-11-23: 1000 mg via INTRAVENOUS
  Filled 2022-11-23: qty 50

## 2022-11-23 MED ORDER — METHOTREXATE SODIUM CHEMO INJECTION (PF) 50 MG/2ML
40.0000 mg/m2 | Freq: Once | INTRAMUSCULAR | Status: AC
  Administered 2022-11-23: 69.5 mg via INTRAVENOUS
  Filled 2022-11-23: qty 2.78

## 2022-11-23 MED ORDER — SODIUM CHLORIDE 0.9% FLUSH
10.0000 mL | INTRAVENOUS | Status: DC | PRN
  Administered 2022-11-23: 10 mL

## 2022-11-23 MED ORDER — SODIUM CHLORIDE 0.9% FLUSH
10.0000 mL | Freq: Once | INTRAVENOUS | Status: AC
  Administered 2022-11-23: 10 mL

## 2022-11-23 MED ORDER — HEPARIN SOD (PORK) LOCK FLUSH 100 UNIT/ML IV SOLN
500.0000 [IU] | Freq: Once | INTRAVENOUS | Status: AC
  Administered 2022-11-23: 500 [IU]

## 2022-11-23 MED ORDER — PALONOSETRON HCL INJECTION 0.25 MG/5ML
0.2500 mg | Freq: Once | INTRAVENOUS | Status: AC
  Administered 2022-11-23: 0.25 mg via INTRAVENOUS
  Filled 2022-11-23: qty 5

## 2022-11-23 NOTE — Patient Instructions (Signed)
[facility]  Discharge Instructions: Thank you for choosing [facility] to provide your oncology and hematology care.   If you have a lab appointment with the Cancer Center, please go directly to the Cancer Center and check in at the registration area.   Wear comfortable clothing and clothing appropriate for easy access to any Portacath or PICC line.   We strive to give you quality time with your provider. You may need to reschedule your appointment if you arrive late (15 or more minutes).  Arriving late affects you and other patients whose appointments are after yours.  Also, if you miss three or more appointments without notifying the office, you may be dismissed from the clinic at the provider's discretion.      For prescription refill requests, have your pharmacy contact our office and allow 72 hours for refills to be completed.    Today you received the following chemotherapy and/or immunotherapy agents: Cyclophosphamide, Methotrexate, Fluorouracil      To help prevent nausea and vomiting after your treatment, we encourage you to take your nausea medication as directed.  BELOW ARE SYMPTOMS THAT SHOULD BE REPORTED IMMEDIATELY: *FEVER GREATER THAN 100.4 F (38 C) OR HIGHER *CHILLS OR SWEATING *NAUSEA AND VOMITING THAT IS NOT CONTROLLED WITH YOUR NAUSEA MEDICATION *UNUSUAL SHORTNESS OF BREATH *UNUSUAL BRUISING OR BLEEDING *URINARY PROBLEMS (pain or burning when urinating, or frequent urination) *BOWEL PROBLEMS (unusual diarrhea, constipation, pain near the anus) TENDERNESS IN MOUTH AND THROAT WITH OR WITHOUT PRESENCE OF ULCERS (sore throat, sores in mouth, or a toothache) UNUSUAL RASH, SWELLING OR PAIN  UNUSUAL VAGINAL DISCHARGE OR ITCHING   Items with * indicate a potential emergency and should be followed up as soon as possible or go to the Emergency Department if any problems should occur.  Please show the CHEMOTHERAPY ALERT CARD  or IMMUNOTHERAPY ALERT CARD at check-in to the Emergency Department and triage nurse.  Should you have questions after your visit or need to cancel or reschedule your appointment, please contact [facility]  Dept: [phone]  and follow the prompts.  Office hours are 8:00 a.m. to 4:30 p.m. Monday - Friday. Please note that voicemails left after 4:00 p.m. may not be returned until the following business day.  We are closed weekends and major holidays. You have access to a nurse at all times for urgent questions. Please call the main number to the clinic Dept: [phone] and follow the prompts.   For any non-urgent questions, you may also contact your provider using MyChart. We now offer e-Visits for anyone 75 and older to request care online for non-urgent symptoms. For details visit [email or web address].   Also download the MyChart app! Go to the app store, search "MyChart", open the app, select [place], and log in with your MyChart username and password.  Cyclophosphamide Injection What is this medication? CYCLOPHOSPHAMIDE (sye kloe FOSS fa mide) treats some types of cancer. It works by slowing down the growth of cancer cells. This medicine may be used for other purposes; ask your health care provider or pharmacist if you have questions. COMMON BRAND NAME(S): Cyclophosphamide, Cytoxan, Neosar What should I tell my care team before I take this medication? They need to know if you have any of these conditions: Heart disease Irregular heartbeat or rhythm Infection Kidney problems Liver disease Low blood cell levels (white cells, platelets, or red blood cells) Lung disease Previous radiation Trouble passing urine  An unusual or allergic reaction to cyclophosphamide, other medications, foods, dyes, or preservatives Pregnant or trying to get pregnant Breast-feeding How should I use this medication? This medication is injected into a vein. It is  given by your care team in a hospital or clinic setting. Talk to your care team about the use of this medication in children. Special care may be needed. Overdosage: If you think you have taken too much of this medicine contact a poison control center or emergency room at once. NOTE: This medicine is only for you. Do not share this medicine with others. What if I miss a dose? Keep appointments for follow-up doses. It is important not to miss your dose. Call your care team if you are unable to keep an appointment. What may interact with this medication? Amphotericin B Amiodarone Azathioprine Certain antivirals for HIV or hepatitis Certain medications for blood pressure, such as enalapril, lisinopril, quinapril Cyclosporine Diuretics Etanercept Indomethacin Medications that relax muscles Metronidazole Natalizumab Tamoxifen Warfarin This list may not describe all possible interactions. Give your health care provider a list of all the medicines, herbs, non-prescription drugs, or dietary supplements you use. Also tell them if you smoke, drink alcohol, or use illegal drugs. Some items may interact with your medicine. What should I watch for while using this medication? This medication may make you feel generally unwell. This is not uncommon as chemotherapy can affect healthy cells as well as cancer cells. Report any side effects. Continue your course of treatment even though you feel ill unless your care team tells you to stop. You may need blood work while you are taking this medication. This medication may increase your risk of getting an infection. Call your care team for advice if you get a fever, chills, sore throat, or other symptoms of a cold or flu. Do not treat yourself. Try to avoid being around people who are sick. Avoid taking medications that contain aspirin, acetaminophen, ibuprofen, naproxen, or ketoprofen unless instructed by your care team. These medications may hide a fever. Be  careful brushing or flossing your teeth or using a toothpick because you may get an infection or bleed more easily. If you have any dental work done, tell your dentist you are receiving this medication. Drink water or other fluids as directed. Urinate often, even at night. Some products may contain alcohol. Ask your care team if this medication contains alcohol. Be sure to tell all care teams you are taking this medicine. Certain medicines, like metronidazole and disulfiram, can cause an unpleasant reaction when taken with alcohol. The reaction includes flushing, headache, nausea, vomiting, sweating, and increased thirst. The reaction can last from 30 minutes to several hours. Talk to your care team if you wish to become pregnant or think you might be pregnant. This medication can cause serious birth defects if taken during pregnancy and for 1 year after the last dose. A negative pregnancy test is required before starting this medication. A reliable form of contraception is recommended while taking this medication and for 1 year after the last dose. Talk to your care team about reliable forms of contraception. Do not father a child while taking this medication and for 4 months after the last dose. Use a condom during this time period. Do not breast-feed while taking this medication or for 1 week after the last dose. This medication may cause infertility. Talk to your care team if you are concerned about your fertility. Talk to your care team about your risk of  cancer. You may be more at risk for certain types of cancer if you take this medication. What side effects may I notice from receiving this medication? Side effects that you should report to your care team as soon as possible: Allergic reactions--skin rash, itching, hives, swelling of the face, lips, tongue, or throat Dry cough, shortness of breath or trouble breathing Heart failure--shortness of breath, swelling of the ankles, feet, or hands,  sudden weight gain, unusual weakness or fatigue Heart muscle inflammation--unusual weakness or fatigue, shortness of breath, chest pain, fast or irregular heartbeat, dizziness, swelling of the ankles, feet, or hands Heart rhythm changes--fast or irregular heartbeat, dizziness, feeling faint or lightheaded, chest pain, trouble breathing Infection--fever, chills, cough, sore throat, wounds that don't heal, pain or trouble when passing urine, general feeling of discomfort or being unwell Kidney injury--decrease in the amount of urine, swelling of the ankles, hands, or feet Liver injury--right upper belly pain, loss of appetite, nausea, light-colored stool, dark yellow or brown urine, yellowing skin or eyes, unusual weakness or fatigue Low red blood cell level--unusual weakness or fatigue, dizziness, headache, trouble breathing Low sodium level--muscle weakness, fatigue, dizziness, headache, confusion Red or dark brown urine Unusual bruising or bleeding Side effects that usually do not require medical attention (report to your care team if they continue or are bothersome): Hair loss Irregular menstrual cycles or spotting Loss of appetite Nausea Pain, redness, or swelling with sores inside the mouth or throat Vomiting This list may not describe all possible side effects. Call your doctor for medical advice about side effects. You may report side effects to FDA at 1-800-FDA-1088. Where should I keep my medication? This medication is given in a hospital or clinic. It will not be stored at home. NOTE: This sheet is a summary. It may not cover all possible information. If you have questions about this medicine, talk to your doctor, pharmacist, or health care provider.  2024 Elsevier/Gold Standard (2021-07-09 00:00:00)  Methotrexate Injection What is this medication? METHOTREXATE (METH oh TREX ate) treats inflammatory conditions such as arthritis and psoriasis. It works by decreasing inflammation,  which can reduce pain and prevent long-term injury to the joints and skin. It may also be used to treat some types of cancer. It works by slowing down the growth of cancer cells. This medicine may be used for other purposes; ask your health care provider or pharmacist if you have questions. What should I tell my care team before I take this medication? They need to know if you have any of these conditions: Fluid in the stomach area or lungs Frequently drink alcohol Infection or immune system problems Kidney disease Liver disease Low blood counts (white cells, platelets, or red blood cells) Lung disease Recent or ongoing radiation Recent or upcoming vaccine Stomach ulcers Ulcerative colitis An unusual or allergic reaction to methotrexate, other medications, foods, dyes, or preservatives Pregnant or trying to get pregnant Breastfeeding How should I use this medication? This medication is for infusion into a vein or for injection into muscle or into the spinal fluid (whichever applies). It is usually given in a hospital or clinic setting. In rare cases, you might get this medication at home. You will be taught how to give this medication. Use exactly as directed. Take your medication at regular intervals. Do not take your medication more often than directed. If this medication is used for arthritis or psoriasis, it should be taken weekly, NOT daily. It is important that you put your used needles and  syringes in a special sharps container. Do not put them in a trash can. If you do not have a sharps container, call your pharmacist or care team to get one. Talk to your care team about the use of this medication in children. While this medication may be prescribed for children as young as 2 years for selected conditions, precautions do apply. Overdosage: If you think you have taken too much of this medicine contact a poison control center or emergency room at once. NOTE: This medicine is only for  you. Do not share this medicine with others. What if I miss a dose? It is important not to miss your dose. Call your care team if you are unable to keep an appointment. If you give yourself the medication, and you miss a dose, talk with your care team. Do not take double or extra doses. What may interact with this medication? Do not take this medication with any of the following: Acitretin Probenecid This medication may also interact with the following: Aspirin or aspirin-like medications Azathioprine Certain antibiotics, such as gentamicin, penicillin, tetracycline, vancomycin Certain medications that treat or prevent blood clots, such as warfarin, apixaban, dabigatran, rivaroxaban Certain medications for stomach problems, such as esomeprazole, omeprazole, pantoprazole Dapsone Hydroxychloroquine Live virus vaccines Medications for viral infections, such as acyclovir, cidofovir, foscarnet, ganciclovir Mercaptopurine NSAIDs, medications for pain and inflammation, such as ibuprofen or naproxen Phenytoin Pyrimethamine Retinoids, such as isotretinoin or tretinoin Sulfonamides, such as sulfasalazine or trimethoprim; sulfamethoxazole Theophylline This list may not describe all possible interactions. Give your health care provider a list of all the medicines, herbs, non-prescription drugs, or dietary supplements you use. Also tell them if you smoke, drink alcohol, or use illegal drugs. Some items may interact with your medicine. What should I watch for while using this medication? This medication may make you feel generally unwell. This is not uncommon as chemotherapy can affect healthy cells as well as cancer cells. Report any side effects. Continue your course of treatment even though you feel ill unless your care team tells you to stop. Your condition will be monitored carefully while you are receiving this medication. Avoid alcoholic drinks. This medication can cause serious side effects.  To reduce the risk, your care team may give you other medications to take before receiving this one. Be sure to follow the directions from your care team. This medication can make you more sensitive to the sun. Keep out of the sun. If you cannot avoid being in the sun, wear protective clothing and use sunscreen. Do not use sun lamps or tanning beds/booths. You may get drowsy or dizzy. Do not drive, use machinery, or do anything that needs mental alertness until you know how this medication affects you. Do not stand or sit up quickly, especially if you are an older patient. This reduces the risk of dizzy or fainting spells. You may need blood work while you are taking this medication. Call your care team for advice if you get a fever, chills or sore throat, or other symptoms of a cold or flu. Do not treat yourself. This medication decreases your body's ability to fight infections. Try to avoid being around people who are sick. This medication may increase your risk to bruise or bleed. Call your care team if you notice any unusual bleeding. Be careful brushing or flossing your teeth or using a toothpick because you may get an infection or bleed more easily. If you have any dental work done, tell your dentist you  are receiving this medication Check with your care team if you get an attack of severe diarrhea, nausea and vomiting, or if you sweat a lot. The loss of too much body fluid can make it dangerous for you to take this medication. Talk to your care team about your risk of cancer. You may be more at risk for certain types of cancers if you take this medication. Do not become pregnant while taking this medication or for 6 months after stopping it. Women should inform their care team if they wish to become pregnant or think they might be pregnant. Men should not father a child while taking this medication and for 3 months after stopping it. There is potential for serious harm to an unborn child. Talk to  your care team for more information. Do not breast-feed an infant while taking this medication or for 1 week after stopping it. This medication may make it more difficult to get pregnant or father a child. Talk to your care team if you are concerned about your fertility. What side effects may I notice from receiving this medication? Side effects that you should report to your care team as soon as possible: Allergic reactions--skin rash, itching, hives, swelling of the face, lips, tongue, or throat Blood clot--pain, swelling, or warmth in the leg, shortness of breath, chest pain Dry cough, shortness of breath or trouble breathing Infection--fever, chills, cough, sore throat, wounds that don't heal, pain or trouble when passing urine, general feeling of discomfort or being unwell Kidney injury--decrease in the amount of urine, swelling of the ankles, hands, or feet Liver injury--right upper belly pain, loss of appetite, nausea, light-colored stool, dark yellow or brown urine, yellowing of the skin or eyes, unusual weakness or fatigue Low red blood cell count--unusual weakness or fatigue, dizziness, headache, trouble breathing Redness, blistering, peeling, or loosening of the skin, including inside the mouth Seizures Unusual bruising or bleeding Side effects that usually do not require medical attention (report to your care team if they continue or are bothersome): Diarrhea Dizziness Hair loss Nausea Pain, redness, or swelling with sores inside the mouth or throat Vomiting This list may not describe all possible side effects. Call your doctor for medical advice about side effects. You may report side effects to FDA at 1-800-FDA-1088. Where should I keep my medication? This medication is given in a hospital or clinic. It will not be stored at home. NOTE: This sheet is a summary. It may not cover all possible information. If you have questions about this medicine, talk to your doctor, pharmacist,  or health care provider.  2024 Elsevier/Gold Standard (2022-07-27 00:00:00)  Fluorouracil Injection What is this medication? FLUOROURACIL (flure oh YOOR a sil) treats some types of cancer. It works by slowing down the growth of cancer cells. This medicine may be used for other purposes; ask your health care provider or pharmacist if you have questions. COMMON BRAND NAME(S): Adrucil What should I tell my care team before I take this medication? They need to know if you have any of these conditions: Blood disorders Dihydropyrimidine dehydrogenase (DPD) deficiency Infection, such as chickenpox, cold sores, herpes Kidney disease Liver disease Poor nutrition Recent or ongoing radiation therapy An unusual or allergic reaction to fluorouracil, other medications, foods, dyes, or preservatives If you or your partner are pregnant or trying to get pregnant Breast-feeding How should I use this medication? This medication is injected into a vein. It is administered by your care team in a hospital or clinic  setting. Talk to your care team about the use of this medication in children. Special care may be needed. Overdosage: If you think you have taken too much of this medicine contact a poison control center or emergency room at once. NOTE: This medicine is only for you. Do not share this medicine with others. What if I miss a dose? Keep appointments for follow-up doses. It is important not to miss your dose. Call your care team if you are unable to keep an appointment. What may interact with this medication? Do not take this medication with any of the following: Live virus vaccines This medication may also interact with the following: Medications that treat or prevent blood clots, such as warfarin, enoxaparin, dalteparin This list may not describe all possible interactions. Give your health care provider a list of all the medicines, herbs, non-prescription drugs, or dietary supplements you use.  Also tell them if you smoke, drink alcohol, or use illegal drugs. Some items may interact with your medicine. What should I watch for while using this medication? Your condition will be monitored carefully while you are receiving this medication. This medication may make you feel generally unwell. This is not uncommon as chemotherapy can affect healthy cells as well as cancer cells. Report any side effects. Continue your course of treatment even though you feel ill unless your care team tells you to stop. In some cases, you may be given additional medications to help with side effects. Follow all directions for their use. This medication may increase your risk of getting an infection. Call your care team for advice if you get a fever, chills, sore throat, or other symptoms of a cold or flu. Do not treat yourself. Try to avoid being around people who are sick. This medication may increase your risk to bruise or bleed. Call your care team if you notice any unusual bleeding. Be careful brushing or flossing your teeth or using a toothpick because you may get an infection or bleed more easily. If you have any dental work done, tell your dentist you are receiving this medication. Avoid taking medications that contain aspirin, acetaminophen, ibuprofen, naproxen, or ketoprofen unless instructed by your care team. These medications may hide a fever. Do not treat diarrhea with over the counter products. Contact your care team if you have diarrhea that lasts more than 2 days or if it is severe and watery. This medication can make you more sensitive to the sun. Keep out of the sun. If you cannot avoid being in the sun, wear protective clothing and sunscreen. Do not use sun lamps, tanning beds, or tanning booths. Talk to your care team if you or your partner wish to become pregnant or think you might be pregnant. This medication can cause serious birth defects if taken during pregnancy and for 3 months after the last  dose. A reliable form of contraception is recommended while taking this medication and for 3 months after the last dose. Talk to your care team about effective forms of contraception. Do not father a child while taking this medication and for 3 months after the last dose. Use a condom while having sex during this time period. Do not breastfeed while taking this medication. This medication may cause infertility. Talk to your care team if you are concerned about your fertility. What side effects may I notice from receiving this medication? Side effects that you should report to your care team as soon as possible: Allergic reactions--skin rash, itching, hives, swelling  of the face, lips, tongue, or throat Heart attack--pain or tightness in the chest, shoulders, arms, or jaw, nausea, shortness of breath, cold or clammy skin, feeling faint or lightheaded Heart failure--shortness of breath, swelling of the ankles, feet, or hands, sudden weight gain, unusual weakness or fatigue Heart rhythm changes--fast or irregular heartbeat, dizziness, feeling faint or lightheaded, chest pain, trouble breathing High ammonia level--unusual weakness or fatigue, confusion, loss of appetite, nausea, vomiting, seizures Infection--fever, chills, cough, sore throat, wounds that don't heal, pain or trouble when passing urine, general feeling of discomfort or being unwell Low red blood cell level--unusual weakness or fatigue, dizziness, headache, trouble breathing Pain, tingling, or numbness in the hands or feet, muscle weakness, change in vision, confusion or trouble speaking, loss of balance or coordination, trouble walking, seizures Redness, swelling, and blistering of the skin over hands and feet Severe or prolonged diarrhea Unusual bruising or bleeding Side effects that usually do not require medical attention (report to your care team if they continue or are bothersome): Dry skin Headache Increased tears Nausea Pain,  redness, or swelling with sores inside the mouth or throat Sensitivity to light Vomiting This list may not describe all possible side effects. Call your doctor for medical advice about side effects. You may report side effects to FDA at 1-800-FDA-1088. Where should I keep my medication? This medication is given in a hospital or clinic. It will not be stored at home. NOTE: This sheet is a summary. It may not cover all possible information. If you have questions about this medicine, talk to your doctor, pharmacist, or health care provider.  2024 Elsevier/Gold Standard (2021-06-29 00:00:00)

## 2022-11-24 ENCOUNTER — Telehealth: Payer: Self-pay

## 2022-11-24 ENCOUNTER — Encounter: Payer: Self-pay | Admitting: Hematology and Oncology

## 2022-11-24 NOTE — Telephone Encounter (Signed)
LM for patient that this nurse was calling to see how they were doing after their treatment. Please call back to Dr. Gudena's nurse at [phone] if they have any questions or concerns regarding the treatment. 

## 2022-11-24 NOTE — Telephone Encounter (Signed)
Dr. Werner Lean -1st time [facility] f/u call - tolerated well Received: Donney Dice, RN  P Onc Triage Nurse [facility] Caller: Unspecified Burgess Estelle,  3:54 PM)

## 2022-11-28 ENCOUNTER — Ambulatory Visit: Payer: Medicare Other | Admitting: Physical Therapy

## 2022-11-28 ENCOUNTER — Encounter: Payer: Self-pay | Admitting: Physical Therapy

## 2022-11-28 DIAGNOSIS — Z171 Estrogen receptor negative status [ER-]: Secondary | ICD-10-CM | POA: Diagnosis not present

## 2022-11-28 DIAGNOSIS — M25612 Stiffness of left shoulder, not elsewhere classified: Secondary | ICD-10-CM | POA: Diagnosis not present

## 2022-11-28 DIAGNOSIS — R293 Abnormal posture: Secondary | ICD-10-CM | POA: Diagnosis not present

## 2022-11-28 DIAGNOSIS — Z483 Aftercare following surgery for neoplasm: Secondary | ICD-10-CM

## 2022-11-28 DIAGNOSIS — M25611 Stiffness of right shoulder, not elsewhere classified: Secondary | ICD-10-CM

## 2022-11-28 DIAGNOSIS — C50412 Malignant neoplasm of upper-outer quadrant of left female breast: Secondary | ICD-10-CM | POA: Diagnosis not present

## 2022-11-28 NOTE — Therapy (Signed)
OUTPATIENT PHYSICAL THERAPY BREAST CANCER TREATMENT   Patient Name: Jessica [name] MRN: [id number] DOB:[birth year], 82 y.o., female Today's Date: 11/28/2022  END OF SESSION:  PT End of Session - 11/28/22 1018     Visit Number 4    Number of Visits 10    Date for PT Re-Evaluation 12/12/22    PT Start Time 1003    PT Stop Time 1105    PT Time Calculation (min) 62 min    Activity Tolerance Patient tolerated treatment well    Behavior During Therapy [facility] for tasks assessed/performed             Past Medical History:  Diagnosis Date   Anemia 12/2015   Bronchitis, chronic (HCC)    in her 41s   Cataract    Chronic kidney disease    in 20s had nephritis   Closed fracture of right distal humerus    right   Coronary artery disease    Hand, foot and mouth disease (HFMD)    Hearing loss 05/03/2016   High cholesterol    Hypothyroidism    Myocardial infarction (HCC)    Osteopenia    Pericarditis 01/1999   Pneumothorax 01/1999   Thyroid disorder    Vitamin B 12 deficiency    Past Surgical History:  Procedure Laterality Date   birthmark      removed,left leg   BREAST BIOPSY Left 09/13/2022   [place] LT BREAST BX W LOC DEV 1ST LESION IMG BX SPEC US GUIDE 09/13/2022 [facility]   CARDIAC CATHETERIZATION     CATARACT EXTRACTION  2017   CORONARY STENT INTERVENTION N/A 09/01/2020   Procedure: CORONARY STENT INTERVENTION;  Surgeon: Lyn Records, MD;  [facility];  Service: Cardiovascular;  Laterality: N/A;   LEFT HEART CATH AND CORONARY ANGIOGRAPHY N/A 09/01/2020   Procedure: LEFT HEART CATH AND CORONARY ANGIOGRAPHY;  Surgeon: Lyn Records, MD;  [facility];  Service: Cardiovascular;  Laterality: N/A;   ORIF HUMERUS FRACTURE Right 04/10/2019   Procedure: RIGHT OPEN REDUCTION INTERNAL FIXATION (ORIF) DISTAL HUMERUS FRACTURE WITH EXTENSION;  Surgeon: Bjorn Pippin, MD;  Location: [facility];  Service: Orthopedics;  Laterality:  Right;   PORTACATH PLACEMENT N/A 11/15/2022   Procedure: PORT PLACEMENT WITH ULTRASOUND GUIDANCE;  Surgeon: Harriette Bouillon, MD;  [facility];  Service: General;  Laterality: N/A;   SIMPLE MASTECTOMY WITH AXILLARY SENTINEL NODE BIOPSY Bilateral 10/18/2022   Procedure: BILATERAL SIMPLE MASTECTOMY;  Surgeon: Harriette Bouillon, MD;  Location: [facility];  Service: General;  Laterality: Bilateral;  PEC BLOCK   TONSILLECTOMY AND ADENOIDECTOMY  1952?   Patient Active Problem List   Diagnosis Date Noted   Malignant neoplasm of upper-outer quadrant of left breast in female, estrogen receptor negative (HCC) 09/21/2022   Cancer determined by biopsy of breast (HCC) 09/12/2022   Atherosclerotic heart disease of native coronary artery with other forms of angina pectoris (HCC) 04/08/2022   Swelling of left foot 04/07/2022   Left hand fracture, sequela 04/07/2022   Depression with anxiety 04/07/2022   NSTEMI (non-ST elevated myocardial infarction) (HCC) 09/01/2020   Closed fracture of right distal humerus 04/10/2019   Dyspnea 05/09/2018   Vitamin B 12 deficiency 05/09/2018   Cough 03/06/2018   Senile osteoporosis 11/20/2017   Iron deficiency 04/26/2017   Lichen sclerosus 04/26/2017   External hemorrhoid 04/26/2017   Weakness of left leg 06/07/2016   Balance disorder 05/24/2016   Memory deficit 05/24/2016  Anemia 05/19/2016   Hearing loss 05/03/2016   Hypothyroidism 07/24/2013   Bradycardia on ECG 07/24/2013   Numbness 08/07/2012   HYPERLIPIDEMIA 12/29/2006    REFERRING PROVIDER: Dr. Harriette Bouillon  REFERRING DIAG: Left breast cancer  THERAPY DIAG:  Stiffness of left shoulder, not elsewhere classified  Stiffness of right shoulder, not elsewhere classified  Aftercare following surgery for neoplasm  Abnormal posture  Malignant neoplasm of upper-outer quadrant of left breast in female, estrogen receptor negative (HCC)  Rationale for Evaluation and Treatment:  Rehabilitation  ONSET DATE: 10/18/2022  SUBJECTIVE:                                                                                                                                                                                           SUBJECTIVE STATEMENT: I have been feeling shaky since starting the chemo.   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 She underwent a bilateral mastectomy and left sentinel node biopsy (7 negative nodes) on 10/18/2022. It is triple negative with a Ki67 of 5%. She has some cardiac history and had an elbow fracture in 2022 resulting in surgery where pins were placed.   PATIENT GOALS:  Reassess how my recovery is going related to arm function, pain, and swelling.  PAIN:  Are you having pain? No just sensitive  PRECAUTIONS: Recent Surgery, left UE Lymphedema risk  RED FLAGS: None   ACTIVITY LEVEL / LEISURE: She is walking half to 1 mile daily   OBJECTIVE:   PATIENT SURVEYS:  QUICK DASH:     OBSERVATIONS: Bilateral incisions appear to be healing well. There is skin overlap and edema present with tissue on the lateral aspects on her chest.  POSTURE:  Forward head and rounded shoulders   LYMPHEDEMA ASSESSMENT:   UPPER EXTREMITY AROM/PROM:   A/PROM RIGHT   eval   RIGHT 11/14/2022  Shoulder extension 63 60  Shoulder flexion 128 110  Shoulder abduction 137 136  Shoulder internal rotation 68 75  Shoulder external rotation 73 75                          (Blank rows = not tested)   A/PROM LEFT   eval LEFT 11/14/2022  Shoulder extension 53 49  Shoulder flexion 135 109  Shoulder abduction 146 114  Shoulder internal rotation 74 77  Shoulder external rotation 78 70                          (Blank rows = not tested)  UPPER EXTREMITY STRENGTH: WFL   LYMPHEDEMA ASSESSMENTS: NOT ASSESSED AS PT IS NOT HAVING ANY LYMPH NODES REMOVED   LANDMARK RIGHT   eval  RIGHT 11/14/2022  10 cm proximal to olecranon process  Not entered due pt was not originally having nodes removed 27.6  Olecranon process   24.3  10 cm proximal to ulnar styloid process   20.1  Just proximal to ulnar styloid process   15.1  Across hand at thumb web space   16.7  At base of 2nd digit   6.1  (Blank rows = not tested)   LANDMARK LEFT   eval LEFT 11/14/2022  10 cm proximal to olecranon process   Not entered due pt was not originally having nodes removed 26.4  Olecranon process   23.4  10 cm proximal to ulnar styloid process   19.5  Just proximal to ulnar styloid process   13.9  Across hand at thumb web space   16.4  At base of 2nd digit   6.1  (Blank rows = not tested) Surgery type/Date: 10/18/2022 Number of lymph nodes removed: 7 Current/past treatment (chemo, radiation, hormone therapy): none Other symptoms:  Heaviness/tightness No Pain Yes Pitting edema No Infections No Decreased scar mobility Yes Stemmer sign No   TODAY'S TREATMENT: Date: 11/28/22: Therapeutic Exercises Pulleys into flexion x 2 mins and then in to abduction x 2 min Ball flexion x 10 on wall with v/c for proper form, abduction x 10 bilaterally with v/c for proper form Manual Therapy P/ROM to bil shoulders into flexion, abd and ER to pts tolerance with therapist able to achieve full PROM MFR to Lt UE at area of cording from axilla to medial upper arm with numerous cords palpable MLD: Rt inguinal nodes, Rt axillo-inguinal anastomosis and then focused on edema inferior to Rt mastectomy instructing pt in this and having her return demo then repeated on L side which appears more swollen today Edema management: cut 1/2 grey foam and placed in small stockinette to wear across bilateral chest to decrease post op edema  11/21/22: Manual Therapy P/ROM to bil shoulders into flexion, abd and D2 to pts tolerance and with scapular depression by therapist throughout MFR to Lt UE at area of cording from axilla  to medial upper arm MLD: Rt inguinal nodes, Rt axillo-inguinal anastomosis and then focused on edema inferior to Rt mastectomy instructing pt in this and having her return demo Therapeutic Exercises Pulleys into flex x 2:30 mins with VC's during to decrease scapular compensation  PATIENT EDUCATION:  Education details: HEP Person educated: Patient Education method: Medical illustrator Education comprehension: verbalized understanding and returned demonstration  HOME EXERCISE PROGRAM: Reviewed previously given post op HEP.  ASSESSMENT:  CLINICAL IMPRESSION: Continued with AAROM exercises today and added ball exercise today to increase end range of motion. Continued to focus on bilateral shoulder PROM and was able to achieve full PROM today. Performed MLD to bilateral chest and lateral trunk to help decrease post op edema and cut 1/2 grey foam in large rectangles to wear across bilateral chest for additional compression.   Pt will benefit from skilled therapeutic intervention to improve on the following deficits: Decreased knowledge of precautions, impaired UE functional use, pain, decreased ROM, postural dysfunction.   PT treatment/interventions: ADL/Self care home management, Therapeutic exercises, Therapeutic activity, Patient/Family education, Self Care, Manual lymph drainage, scar mobilization, Manual therapy, and Re-evaluation   GOALS: Goals reviewed with patient? Yes  LONG TERM GOALS:  (STG=LTG)  GOALS Name Target  Date  Goal status  1 Pt will demonstrate she has regained full shoulder ROM and function post operatively compared to baselines.  Baseline: 12/12/2022 IN PROGRESS  2 Patient will report >/= 50% less pain and sensitivity in her chest midline to tolerate daily tasks with greater ease 12/12/2022 INITIAL  3 Patient will improve her DASH score to be back to baseline (4.55) for improved overall UE function. 12/12/2022 INITIAL  4 Patient will report good understanding  of lymphedema risk reduction practices. 12/12/2022 INITIAL     PLAN:  PT FREQUENCY/DURATION: 2x/week for 4 weeks  PLAN FOR NEXT SESSION: Instruct in supine scap, Cont ROM exercises bil shoulders; begin scar massage when fully healed (2-3 more weeks ~9/30))   [facility]  [address]  [place] [address]  [phone]    Milagros Loll [place], PT 11/28/2022, 11:10 AM

## 2022-11-30 ENCOUNTER — Encounter: Payer: Self-pay | Admitting: Physical Therapy

## 2022-11-30 ENCOUNTER — Ambulatory Visit: Payer: Medicare Other | Admitting: Physical Therapy

## 2022-11-30 DIAGNOSIS — R293 Abnormal posture: Secondary | ICD-10-CM

## 2022-11-30 DIAGNOSIS — C50412 Malignant neoplasm of upper-outer quadrant of left female breast: Secondary | ICD-10-CM | POA: Diagnosis not present

## 2022-11-30 DIAGNOSIS — Z483 Aftercare following surgery for neoplasm: Secondary | ICD-10-CM

## 2022-11-30 DIAGNOSIS — M25611 Stiffness of right shoulder, not elsewhere classified: Secondary | ICD-10-CM | POA: Diagnosis not present

## 2022-11-30 DIAGNOSIS — M25612 Stiffness of left shoulder, not elsewhere classified: Secondary | ICD-10-CM

## 2022-11-30 DIAGNOSIS — Z171 Estrogen receptor negative status [ER-]: Secondary | ICD-10-CM | POA: Diagnosis not present

## 2022-11-30 NOTE — Patient Instructions (Signed)
Over Head Pull: Narrow and Wide Grip   Cancer Rehab [phone]   On back, knees bent, feet flat, band across thighs, elbows straight but relaxed. Pull hands apart (start). Keeping elbows straight, bring arms up and over head, hands toward floor. Keep pull steady on band. Hold momentarily. Return slowly, keeping pull steady, back to start. Then do same with a wider grip on the band (past shoulder width) Repeat _10__ times. Band color __yellow____   Side Pull: Double Arm   On back, knees bent, feet flat. Arms perpendicular to body, shoulder level, elbows straight but relaxed. Pull arms out to sides, elbows straight. Resistance band comes across collarbones, hands toward floor. Hold momentarily. Slowly return to starting position. Repeat _10__ times. Band color _yellow____   Sword   On back, knees bent, feet flat, left hand on left hip, right hand above left. Pull right arm DIAGONALLY (hip to shoulder) across chest. Bring right arm along head toward floor. Hold momentarily. Thumb is pointed down when by opposite hip and is pointed upwards when by head. Slowly return to starting position. Repeat _10__ times. Do with left arm. Band color _yellow_____   Shoulder Rotation: Double Arm   On back, knees bent, feet flat, elbows tucked at sides, bent 90, hands palms up. Pull hands apart and down toward floor, keeping elbows near sides. Hold momentarily. Slowly return to starting position. Repeat _10__ times. Band color __yellow____

## 2022-11-30 NOTE — Therapy (Signed)
OUTPATIENT PHYSICAL THERAPY BREAST CANCER TREATMENT   Patient Name: Jessica [name] MRN: [id number] DOB:12-06-1940, 82 y.o., female Today's Date: 11/30/2022  END OF SESSION:  PT End of Session - 11/30/22 1153     Visit Number 5    Number of Visits 10    Date for PT Re-Evaluation 12/12/22    PT Start Time 1103    PT Stop Time 1155    PT Time Calculation (min) 52 min    Activity Tolerance Patient tolerated treatment well    Behavior During Therapy [facility] for tasks assessed/performed             Past Medical History:  Diagnosis Date   Anemia 12/2015   Bronchitis, chronic (HCC)    in her 75s   Cataract    Chronic kidney disease    in 20s had nephritis   Closed fracture of right distal humerus    right   Coronary artery disease    Hand, foot and mouth disease (HFMD)    Hearing loss 05/03/2016   High cholesterol    Hypothyroidism    Myocardial infarction (HCC)    Osteopenia    Pericarditis 01/1999   Pneumothorax 01/1999   Thyroid disorder    Vitamin B 12 deficiency    Past Surgical History:  Procedure Laterality Date   birthmark      removed,left leg   BREAST BIOPSY Left 09/13/2022   [place] LT BREAST BX W LOC DEV 1ST LESION IMG BX SPEC US GUIDE 09/13/2022 [facility]   CARDIAC CATHETERIZATION     CATARACT EXTRACTION  2017   CORONARY STENT INTERVENTION N/A 09/01/2020   Procedure: CORONARY STENT INTERVENTION;  Surgeon: Lyn Records, MD;  [facility];  Service: Cardiovascular;  Laterality: N/A;   LEFT HEART CATH AND CORONARY ANGIOGRAPHY N/A 09/01/2020   Procedure: LEFT HEART CATH AND CORONARY ANGIOGRAPHY;  Surgeon: Lyn Records, MD;  [facility];  Service: Cardiovascular;  Laterality: N/A;   ORIF HUMERUS FRACTURE Right 04/10/2019   Procedure: RIGHT OPEN REDUCTION INTERNAL FIXATION (ORIF) DISTAL HUMERUS FRACTURE WITH EXTENSION;  Surgeon: Bjorn Pippin, MD;  Location: [facility];  Service: Orthopedics;  Laterality:  Right;   PORTACATH PLACEMENT N/A 11/15/2022   Procedure: PORT PLACEMENT WITH ULTRASOUND GUIDANCE;  Surgeon: Harriette Bouillon, MD;  [facility];  Service: General;  Laterality: N/A;   SIMPLE MASTECTOMY WITH AXILLARY SENTINEL NODE BIOPSY Bilateral 10/18/2022   Procedure: BILATERAL SIMPLE MASTECTOMY;  Surgeon: Harriette Bouillon, MD;  Location: [facility];  Service: General;  Laterality: Bilateral;  PEC BLOCK   TONSILLECTOMY AND ADENOIDECTOMY  1952?   Patient Active Problem List   Diagnosis Date Noted   Malignant neoplasm of upper-outer quadrant of left breast in female, estrogen receptor negative (HCC) 09/21/2022   Cancer determined by biopsy of breast (HCC) 09/12/2022   Atherosclerotic heart disease of native coronary artery with other forms of angina pectoris (HCC) 04/08/2022   Swelling of left foot 04/07/2022   Left hand fracture, sequela 04/07/2022   Depression with anxiety 04/07/2022   NSTEMI (non-ST elevated myocardial infarction) (HCC) 09/01/2020   Closed fracture of right distal humerus 04/10/2019   Dyspnea 05/09/2018   Vitamin B 12 deficiency 05/09/2018   Cough 03/06/2018   Senile osteoporosis 11/20/2017   Iron deficiency 04/26/2017   Lichen sclerosus 04/26/2017   External hemorrhoid 04/26/2017   Weakness of left leg 06/07/2016   Balance disorder 05/24/2016   Memory deficit 05/24/2016  Anemia 05/19/2016   Hearing loss 05/03/2016   Hypothyroidism 07/24/2013   Bradycardia on ECG 07/24/2013   Numbness 08/07/2012   HYPERLIPIDEMIA 12/29/2006    REFERRING PROVIDER: Dr. Harriette Bouillon  REFERRING DIAG: Left breast cancer  THERAPY DIAG:  Stiffness of left shoulder, not elsewhere classified  Stiffness of right shoulder, not elsewhere classified  Aftercare following surgery for neoplasm  Abnormal posture  Malignant neoplasm of upper-outer quadrant of left breast in female, estrogen receptor negative (HCC)  Rationale for Evaluation and Treatment:  Rehabilitation  ONSET DATE: 10/18/2022  SUBJECTIVE:                                                                                                                                                                                           SUBJECTIVE STATEMENT: I have not been sleeping well.   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 She underwent a bilateral mastectomy and left sentinel node biopsy (7 negative nodes) on 10/18/2022. It is triple negative with a Ki67 of 5%. She has some cardiac history and had an elbow fracture in 2022 resulting in surgery where pins were placed.   PATIENT GOALS:  Reassess how my recovery is going related to arm function, pain, and swelling.  PAIN:  Are you having pain? No just sensitive  PRECAUTIONS: Recent Surgery, left UE Lymphedema risk  RED FLAGS: None   ACTIVITY LEVEL / LEISURE: She is walking half to 1 mile daily   OBJECTIVE:   PATIENT SURVEYS:  QUICK DASH:     OBSERVATIONS: Bilateral incisions appear to be healing well. There is skin overlap and edema present with tissue on the lateral aspects on her chest.  POSTURE:  Forward head and rounded shoulders   LYMPHEDEMA ASSESSMENT:   UPPER EXTREMITY AROM/PROM:   A/PROM RIGHT   eval   RIGHT 11/14/2022  Shoulder extension 63 60  Shoulder flexion 128 110  Shoulder abduction 137 136  Shoulder internal rotation 68 75  Shoulder external rotation 73 75                          (Blank rows = not tested)   A/PROM LEFT   eval LEFT 11/14/2022  Shoulder extension 53 49  Shoulder flexion 135 109  Shoulder abduction 146 114  Shoulder internal rotation 74 77  Shoulder external rotation 78 70                          (Blank rows = not tested)     UPPER  EXTREMITY STRENGTH: WFL   LYMPHEDEMA ASSESSMENTS: NOT ASSESSED AS PT IS NOT HAVING ANY LYMPH NODES REMOVED   LANDMARK RIGHT   eval RIGHT 11/14/2022  10 cm proximal  to olecranon process  Not entered due pt was not originally having nodes removed 27.6  Olecranon process   24.3  10 cm proximal to ulnar styloid process   20.1  Just proximal to ulnar styloid process   15.1  Across hand at thumb web space   16.7  At base of 2nd digit   6.1  (Blank rows = not tested)   LANDMARK LEFT   eval LEFT 11/14/2022  10 cm proximal to olecranon process   Not entered due pt was not originally having nodes removed 26.4  Olecranon process   23.4  10 cm proximal to ulnar styloid process   19.5  Just proximal to ulnar styloid process   13.9  Across hand at thumb web space   16.4  At base of 2nd digit   6.1  (Blank rows = not tested) Surgery type/Date: 10/18/2022 Number of lymph nodes removed: 7 Current/past treatment (chemo, radiation, hormone therapy): none Other symptoms:  Heaviness/tightness No Pain Yes Pitting edema No Infections No Decreased scar mobility Yes Stemmer sign No   TODAY'S TREATMENT: Date: 11/30/22: Therapeutic Exercises Pulleys into flexion x 2 mins and then in to abduction x 2 min Ball flexion x 10 on wall with v/c for proper form, abduction x 10 bilaterally with v/c for proper form Instructed pt in supine scapular strengthening exercises with yellow band x 10 reps each with pt returning therapist demo as follows: narrow and wide grip flexion, ER, horizontal abduction, and diagonals - pt did not have any increased pain with these - issued these as part of her HEP Manual Therapy P/ROM to bil shoulders into flexion, abd and ER to pts tolerance with therapist able to achieve full PROM MFR to Lt UE at area of cording from axilla to medial upper arm with numerous cords palpable MLD: Rt inguinal nodes, Rt axillo-inguinal anastomosis and then focused on edema inferior to Rt mastectomy   11/28/22: Therapeutic Exercises Pulleys into flexion x 2 mins and then in to abduction x 2 min Ball flexion x 10 on wall with v/c for proper form, abduction x 10  bilaterally with v/c for proper form Manual Therapy P/ROM to bil shoulders into flexion, abd and ER to pts tolerance with therapist able to achieve full PROM MFR to Lt UE at area of cording from axilla to medial upper arm with numerous cords palpable MLD: Rt inguinal nodes, Rt axillo-inguinal anastomosis and then focused on edema inferior to Rt mastectomy instructing pt in this and having her return demo then repeated on L side which appears more swollen today Edema management: cut 1/2 grey foam and placed in small stockinette to wear across bilateral chest to decrease post op edema  11/21/22: Manual Therapy P/ROM to bil shoulders into flexion, abd and D2 to pts tolerance and with scapular depression by therapist throughout MFR to Lt UE at area of cording from axilla to medial upper arm MLD: Rt inguinal nodes, Rt axillo-inguinal anastomosis and then focused on edema inferior to Rt mastectomy instructing pt in this and having her return demo Therapeutic Exercises Pulleys into flex x 2:30 mins with VC's during to decrease scapular compensation  PATIENT EDUCATION:  Education details: HEP Person educated: Patient Education method: Medical illustrator Education comprehension: verbalized understanding and returned demonstration  HOME EXERCISE PROGRAM: Reviewed  previously given post op HEP.  ASSESSMENT:  CLINICAL IMPRESSION: Continued with AAROM exercises today and progressed pt towards strengthening today. Instructed her in supine scapular strengthening exercises and issued these as part of an HEP. Continued to work on post op swelling which continues to persist especially on the L side.   Pt will benefit from skilled therapeutic intervention to improve on the following deficits: Decreased knowledge of precautions, impaired UE functional use, pain, decreased ROM, postural dysfunction.   PT treatment/interventions: ADL/Self care home management, Therapeutic exercises, Therapeutic  activity, Patient/Family education, Self Care, Manual lymph drainage, scar mobilization, Manual therapy, and Re-evaluation   GOALS: Goals reviewed with patient? Yes  LONG TERM GOALS:  (STG=LTG)  GOALS Name Target Date  Goal status  1 Pt will demonstrate she has regained full shoulder ROM and function post operatively compared to baselines.  Baseline: 12/12/2022 IN PROGRESS  2 Patient will report >/= 50% less pain and sensitivity in her chest midline to tolerate daily tasks with greater ease 12/12/2022 INITIAL  3 Patient will improve her DASH score to be back to baseline (4.55) for improved overall UE function. 12/12/2022 INITIAL  4 Patient will report good understanding of lymphedema risk reduction practices. 12/12/2022 INITIAL     PLAN:  PT FREQUENCY/DURATION: 2x/week for 4 weeks  PLAN FOR NEXT SESSION: Instruct in supine scap, Cont ROM exercises bil shoulders; begin scar massage when fully healed (2-3 more weeks ~9/30))   [facility]  [address]  [place] [address]  [phone] [place], PT 11/30/2022, 12:04 PM

## 2022-12-05 ENCOUNTER — Encounter: Payer: Self-pay | Admitting: Physical Therapy

## 2022-12-05 ENCOUNTER — Ambulatory Visit: Payer: Medicare Other | Admitting: Physical Therapy

## 2022-12-05 DIAGNOSIS — M6281 Muscle weakness (generalized): Secondary | ICD-10-CM

## 2022-12-05 DIAGNOSIS — M25612 Stiffness of left shoulder, not elsewhere classified: Secondary | ICD-10-CM | POA: Diagnosis not present

## 2022-12-05 DIAGNOSIS — M25611 Stiffness of right shoulder, not elsewhere classified: Secondary | ICD-10-CM

## 2022-12-05 DIAGNOSIS — Z171 Estrogen receptor negative status [ER-]: Secondary | ICD-10-CM

## 2022-12-05 DIAGNOSIS — Z483 Aftercare following surgery for neoplasm: Secondary | ICD-10-CM | POA: Diagnosis not present

## 2022-12-05 DIAGNOSIS — R262 Difficulty in walking, not elsewhere classified: Secondary | ICD-10-CM

## 2022-12-05 DIAGNOSIS — C50412 Malignant neoplasm of upper-outer quadrant of left female breast: Secondary | ICD-10-CM | POA: Diagnosis not present

## 2022-12-05 DIAGNOSIS — R293 Abnormal posture: Secondary | ICD-10-CM | POA: Diagnosis not present

## 2022-12-05 NOTE — Therapy (Signed)
OUTPATIENT PHYSICAL THERAPY BREAST CANCER TREATMENT   Patient Name: Jessica [name] MRN: [id number] DOB:[birth year], 82 y.o., female Today's Date: 12/05/2022  END OF SESSION:  PT End of Session - 12/05/22 1113     Visit Number 6    Number of Visits 14    Date for PT Re-Evaluation 01/02/23    PT Start Time 1107    PT Stop Time 1203    PT Time Calculation (min) 56 min    Activity Tolerance Patient tolerated treatment well    Behavior During Therapy [facility] for tasks assessed/performed             Past Medical History:  Diagnosis Date   Anemia 12/2015   Bronchitis, chronic (HCC)    in her 25s   Cataract    Chronic kidney disease    in 20s had nephritis   Closed fracture of right distal humerus    right   Coronary artery disease    Hand, foot and mouth disease (HFMD)    Hearing loss 05/03/2016   High cholesterol    Hypothyroidism    Myocardial infarction (HCC)    Osteopenia    Pericarditis 01/1999   Pneumothorax 01/1999   Thyroid disorder    Vitamin B 12 deficiency    Past Surgical History:  Procedure Laterality Date   birthmark      removed,left leg   BREAST BIOPSY Left 09/13/2022   [place] LT BREAST BX W LOC DEV 1ST LESION IMG BX SPEC US GUIDE 09/13/2022 [facility]   CARDIAC CATHETERIZATION     CATARACT EXTRACTION  2017   CORONARY STENT INTERVENTION N/A 09/01/2020   Procedure: CORONARY STENT INTERVENTION;  Surgeon: Lyn Records, MD;  Location: [facility];  Service: Cardiovascular;  Laterality: N/A;   LEFT HEART CATH AND CORONARY ANGIOGRAPHY N/A 09/01/2020   Procedure: LEFT HEART CATH AND CORONARY ANGIOGRAPHY;  Surgeon: Lyn Records, MD;  Location: [facility];  Service: Cardiovascular;  Laterality: N/A;   ORIF HUMERUS FRACTURE Right 04/10/2019   Procedure: RIGHT OPEN REDUCTION INTERNAL FIXATION (ORIF) DISTAL HUMERUS FRACTURE WITH EXTENSION;  Surgeon: Bjorn Pippin, MD;  Location: [facility];  Service: Orthopedics;  Laterality:  Right;   PORTACATH PLACEMENT N/A 11/15/2022   Procedure: PORT PLACEMENT WITH ULTRASOUND GUIDANCE;  Surgeon: Harriette Bouillon, MD;  Location: [facility];  Service: General;  Laterality: N/A;   SIMPLE MASTECTOMY WITH AXILLARY SENTINEL NODE BIOPSY Bilateral 10/18/2022   Procedure: BILATERAL SIMPLE MASTECTOMY;  Surgeon: Harriette Bouillon, MD;  Location: [facility];  Service: General;  Laterality: Bilateral;  PEC BLOCK   TONSILLECTOMY AND ADENOIDECTOMY  1951?   Patient Active Problem List   Diagnosis Date Noted   Malignant neoplasm of upper-outer quadrant of left breast in female, estrogen receptor negative (HCC) 09/21/2022   Cancer determined by biopsy of breast (HCC) 09/12/2022   Atherosclerotic heart disease of native coronary artery with other forms of angina pectoris (HCC) 04/08/2022   Swelling of left foot 04/07/2022   Left hand fracture, sequela 04/07/2022   Depression with anxiety 04/07/2022   NSTEMI (non-ST elevated myocardial infarction) (HCC) 09/01/2020   Closed fracture of right distal humerus 04/10/2019   Dyspnea 05/09/2018   Vitamin B 12 deficiency 05/09/2018   Cough 03/06/2018   Senile osteoporosis 11/20/2017   Iron deficiency 04/26/2017   Lichen sclerosus 04/26/2017   External hemorrhoid 04/26/2017   Weakness of left leg 06/07/2016   Balance disorder 05/24/2016   Memory deficit 05/24/2016  Anemia 05/19/2016   Hearing loss 05/03/2016   Hypothyroidism 07/24/2013   Bradycardia on ECG 07/24/2013   Numbness 08/07/2012   HYPERLIPIDEMIA 12/29/2006    REFERRING PROVIDER: Dr. Harriette Bouillon  REFERRING DIAG: Left breast cancer  THERAPY DIAG:  Difficulty in walking, not elsewhere classified  Muscle weakness (generalized)  Stiffness of left shoulder, not elsewhere classified  Stiffness of right shoulder, not elsewhere classified  Aftercare following surgery for neoplasm  Abnormal posture  Malignant neoplasm of upper-outer quadrant of left breast in female,  estrogen receptor negative (HCC)  Rationale for Evaluation and Treatment: Rehabilitation  ONSET DATE: 10/18/2022  SUBJECTIVE:                                                                                                                                                                                           SUBJECTIVE STATEMENT: I am not really sore after the exercises, may a little but not much.   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 She underwent a bilateral mastectomy and left sentinel node biopsy (7 negative nodes) on 10/18/2022. It is triple negative with a Ki67 of 5%. She has some cardiac history and had an elbow fracture in 2022 resulting in surgery where pins were placed.   PATIENT GOALS:  Reassess how my recovery is going related to arm function, pain, and swelling.  PAIN:  Are you having pain? No just sensitive  PRECAUTIONS: Recent Surgery, left UE Lymphedema risk  RED FLAGS: None   ACTIVITY LEVEL / LEISURE: She is walking half to 1 mile daily   OBJECTIVE:   PATIENT SURVEYS:  QUICK DASH:  Quick Dash - 12/05/22 0001     Open a tight or new jar Mild difficulty    Do heavy household chores (wash walls, wash floors) No difficulty    Carry a shopping bag or briefcase No difficulty    Wash your back No difficulty    Use a knife to cut food No difficulty    Recreational activities in which you take some force or impact through your arm, shoulder, or hand (golf, hammering, tennis) No difficulty    During the past week, to what extent has your arm, shoulder or hand problem interfered with your normal social activities with family, friends, neighbors, or groups? Not at all    During the past week, to what extent has your arm, shoulder or hand problem limited your work or other regular daily activities Not at all    Arm, shoulder, or hand pain. None    Tingling (pins and needles) in your arm,  shoulder, or hand None    Difficulty Sleeping No difficulty    DASH Score 2.27 %             Quick Dash - 12/05/22 0001     Open a tight or new jar Mild difficulty    Do heavy household chores (wash walls, wash floors) No difficulty    Carry a shopping bag or briefcase No difficulty    Wash your back No difficulty    Use a knife to cut food No difficulty    Recreational activities in which you take some force or impact through your arm, shoulder, or hand (golf, hammering, tennis) No difficulty    During the past week, to what extent has your arm, shoulder or hand problem interfered with your normal social activities with family, friends, neighbors, or groups? Not at all    During the past week, to what extent has your arm, shoulder or hand problem limited your work or other regular daily activities Not at all    Arm, shoulder, or hand pain. None    Tingling (pins and needles) in your arm, shoulder, or hand None    Difficulty Sleeping No difficulty    DASH Score 2.27 %               OBSERVATIONS: Bilateral incisions appear to be healing well. There is skin overlap and edema present with tissue on the lateral aspects on her chest.  POSTURE:  Forward head and rounded shoulders   LYMPHEDEMA ASSESSMENT:   UPPER EXTREMITY AROM/PROM:   A/PROM RIGHT   eval   RIGHT 11/14/2022  Shoulder extension 63 60  Shoulder flexion 128 110  Shoulder abduction 137 136  Shoulder internal rotation 68 75  Shoulder external rotation 73 75                          (Blank rows = not tested)   A/PROM LEFT   eval LEFT 11/14/2022 LET  12/05/22  Shoulder extension 53 49   Shoulder flexion 135 109 154  Shoulder abduction 146 114 161  Shoulder internal rotation 74 77   Shoulder external rotation 78 70                           (Blank rows = not tested)     UPPER EXTREMITY STRENGTH: WFL  LOWER EXTREMITY STRENGTH:   MMT Right eval  Hip flexion 5/5  Hip extension 4/5  Hip abduction 4/5   Hip adduction   Hip internal rotation   Hip external rotation   Knee flexion 5/5  Knee extension 5/5  Ankle dorsiflexion 5/5  Ankle plantarflexion   Ankle inversion   Ankle eversion    (Blank rows = not tested)  A/PROM LEFT eval  Hip flexion 5/5  Hip extension 3/5  Hip abduction 4+/5  Hip adduction   Hip internal rotation   Hip external rotation   Knee flexion 5/5  Knee extension 5/5  Ankle dorsiflexion 5/5  Ankle plantarflexion   Ankle inversion   Ankle eversion    (Blank rows = not tested)  FUNCTIONAL TEST:  30 SEC SIT TO STAND: 9 reps which is below average for her age  BERG Balance Test: 44/56, anything less than 45 is a greater risk of falling  LYMPHEDEMA ASSESSMENTS: NOT ASSESSED AS PT IS NOT HAVING ANY LYMPH NODES REMOVED   LANDMARK RIGHT   eval RIGHT 11/14/2022  10 cm proximal to olecranon process  Not entered due pt was not originally having nodes removed 27.6  Olecranon process   24.3  10 cm proximal to ulnar styloid process   20.1  Just proximal to ulnar styloid process   15.1  Across hand at thumb web space   16.7  At base of 2nd digit   6.1  (Blank rows = not tested)   LANDMARK LEFT   eval LEFT 11/14/2022  10 cm proximal to olecranon process   Not entered due pt was not originally having nodes removed 26.4  Olecranon process   23.4  10 cm proximal to ulnar styloid process   19.5  Just proximal to ulnar styloid process   13.9  Across hand at thumb web space   16.4  At base of 2nd digit   6.1  (Blank rows = not tested) Surgery type/Date: 10/18/2022 Number of lymph nodes removed: 7 Current/past treatment (chemo, radiation, hormone therapy): none Other symptoms:  Heaviness/tightness No Pain Yes Pitting edema No Infections No Decreased scar mobility Yes Stemmer sign No   TODAY'S TREATMENT: Date: 12/05/22: Pulleys x 2 min in direction of flexion and 2 min in direction of abduction Discussed importance of doing supine scapular strengtheing  exercises in supine for now due to compensation patterns in standing Educated pt in lymphedema risk reduction practices and issued handouts  11/30/22: Therapeutic Exercises Pulleys into flexion x 2 mins and then in to abduction x 2 min Ball flexion x 10 on wall with v/c for proper form, abduction x 10 bilaterally with v/c for proper form Instructed pt in supine scapular strengthening exercises with yellow band x 10 reps each with pt returning therapist demo as follows: narrow and wide grip flexion, ER, horizontal abduction, and diagonals - pt did not have any increased pain with these - issued these as part of her HEP Manual Therapy P/ROM to bil shoulders into flexion, abd and ER to pts tolerance with therapist able to achieve full PROM MFR to Lt UE at area of cording from axilla to medial upper arm with numerous cords palpable MLD: Rt inguinal nodes, Rt axillo-inguinal anastomosis and then focused on edema inferior to Rt mastectomy   11/28/22: Therapeutic Exercises Pulleys into flexion x 2 mins and then in to abduction x 2 min Ball flexion x 10 on wall with v/c for proper form, abduction x 10 bilaterally with v/c for proper form Manual Therapy P/ROM to bil shoulders into flexion, abd and ER to pts tolerance with therapist able to achieve full PROM MFR to Lt UE at area of cording from axilla to medial upper arm with numerous cords palpable MLD: Rt inguinal nodes, Rt axillo-inguinal anastomosis and then focused on edema inferior to Rt mastectomy instructing pt in this and having her return demo then repeated on L side which appears more swollen today Edema management: cut 1/2 grey foam and placed in small stockinette to wear across bilateral chest to decrease post op edema  11/21/22: Manual Therapy P/ROM to bil shoulders into flexion, abd and D2 to pts tolerance and with scapular depression by therapist throughout MFR to Lt UE at area of cording from axilla to medial upper arm MLD: Rt inguinal  nodes, Rt axillo-inguinal anastomosis and then focused on edema inferior to Rt mastectomy instructing pt in this and having her return demo Therapeutic Exercises Pulleys into flex x 2:30 mins with VC's during to decrease scapular compensation  PATIENT EDUCATION:  Education details: HEP Person educated: Patient  Education method: Medical illustrator Education comprehension: verbalized understanding and returned demonstration  HOME EXERCISE PROGRAM: Reviewed previously given post op HEP.  ASSESSMENT:  CLINICAL IMPRESSION: Assessed pt's progress towards goals in therapy. She has now met her shoulder ROM goals and has returned to greater than baseline. She reports that since beginning chemo she has been feeling off balance. Assessed LE strength and balance today. Pt is at a high risk of falls per her BERG balance and her 30 sec sit to stand was below average. Added appropriate goals to address this and updated pt's POC today.   Pt will benefit from skilled therapeutic intervention to improve on the following deficits: Decreased knowledge of precautions, impaired UE functional use, pain, decreased ROM, postural dysfunction.   PT treatment/interventions: ADL/Self care home management, Therapeutic exercises, Therapeutic activity, Patient/Family education, Self Care, Manual lymph drainage, scar mobilization, Manual therapy, and Re-evaluation   GOALS: Goals reviewed with patient? Yes  LONG TERM GOALS:  (STG=LTG)  GOALS Name Target Date  Goal status  1 Pt will demonstrate she has regained full shoulder ROM and function post operatively compared to baselines.  Baseline: 12/12/2022 MET  12/05/22  2 Patient will report >/= 50% less pain and sensitivity in her chest midline to tolerate daily tasks with greater ease 12/12/2022 MET 12/05/22 at least 50% improved  3 Patient will improve her DASH score to be back to baseline (4.55) for improved overall UE function. 12/12/2022 INITIAL  4 Patient  will report good understanding of lymphedema risk reduction practices. 12/12/2022 ONGOING 12/05/22- re eductated pt and issued handouts  5 Pt will receive a compression sleeve to wear when she flies for prophylactic use. 01/02/23 NEW  6 Pt will be able to complete 12 sit to stands in 30 sec wihtout UEs to reduce fall risk. 01/02/23 NEW - current is 9  7 Pt will improve BERG balance score to 50 to decrease risk of falls. 01/02/23 NEW - current is 44     PLAN:  PT FREQUENCY/DURATION: 2x/week for 4 weeks  PLAN FOR NEXT SESSION: measure for compression sleeve, begin high level balance and update HEP,  begin scar massage when fully healed (2-3 more weeks ~9/30), MLD as needed   [facility]  [address]  [place] [address]  [phone] [place], PT 12/05/2022, 1:23 PM

## 2022-12-07 ENCOUNTER — Ambulatory Visit: Payer: Medicare Other | Attending: Surgery | Admitting: Physical Therapy

## 2022-12-07 ENCOUNTER — Encounter: Payer: Self-pay | Admitting: Physical Therapy

## 2022-12-07 DIAGNOSIS — M25611 Stiffness of right shoulder, not elsewhere classified: Secondary | ICD-10-CM | POA: Diagnosis not present

## 2022-12-07 DIAGNOSIS — R293 Abnormal posture: Secondary | ICD-10-CM

## 2022-12-07 DIAGNOSIS — R262 Difficulty in walking, not elsewhere classified: Secondary | ICD-10-CM | POA: Diagnosis not present

## 2022-12-07 DIAGNOSIS — M25612 Stiffness of left shoulder, not elsewhere classified: Secondary | ICD-10-CM

## 2022-12-07 DIAGNOSIS — C50412 Malignant neoplasm of upper-outer quadrant of left female breast: Secondary | ICD-10-CM

## 2022-12-07 DIAGNOSIS — Z171 Estrogen receptor negative status [ER-]: Secondary | ICD-10-CM | POA: Insufficient documentation

## 2022-12-07 DIAGNOSIS — M6281 Muscle weakness (generalized): Secondary | ICD-10-CM

## 2022-12-07 DIAGNOSIS — Z483 Aftercare following surgery for neoplasm: Secondary | ICD-10-CM

## 2022-12-07 NOTE — Therapy (Signed)
OUTPATIENT PHYSICAL THERAPY BREAST CANCER TREATMENT   Patient Name: Jessica [name] MRN: [id number] DOB:28-Feb-1941, 82 y.o., female Today's Date: 12/07/2022  END OF SESSION:  PT End of Session - 12/07/22 1108     Visit Number 7    Number of Visits 14    Date for PT Re-Evaluation 01/02/23    PT Start Time 1107    PT Stop Time 1150    PT Time Calculation (min) 43 min    Activity Tolerance Patient tolerated treatment well    Behavior During Therapy [facility] for tasks assessed/performed             Past Medical History:  Diagnosis Date   Anemia 12/2015   Bronchitis, chronic (HCC)    in her 24s   Cataract    Chronic kidney disease    in 20s had nephritis   Closed fracture of right distal humerus    right   Coronary artery disease    Hand, foot and mouth disease (HFMD)    Hearing loss 05/03/2016   High cholesterol    Hypothyroidism    Myocardial infarction (HCC)    Osteopenia    Pericarditis 01/1999   Pneumothorax 01/1999   Thyroid disorder    Vitamin B 12 deficiency    Past Surgical History:  Procedure Laterality Date   birthmark      removed,left leg   BREAST BIOPSY Left 09/13/2022   [place] LT BREAST BX W LOC DEV 1ST LESION IMG BX SPEC US GUIDE 09/13/2022 [facility] MAMMOGRAPHY   CARDIAC CATHETERIZATION     CATARACT EXTRACTION  2017   CORONARY STENT INTERVENTION N/A 09/01/2020   Procedure: CORONARY STENT INTERVENTION;  Surgeon: Lyn Records, MD;  Location: [facility];  Service: Cardiovascular;  Laterality: N/A;   LEFT HEART CATH AND CORONARY ANGIOGRAPHY N/A 09/01/2020   Procedure: LEFT HEART CATH AND CORONARY ANGIOGRAPHY;  Surgeon: Lyn Records, MD;  Location: [facility];  Service: Cardiovascular;  Laterality: N/A;   ORIF HUMERUS FRACTURE Right 04/10/2019   Procedure: RIGHT OPEN REDUCTION INTERNAL FIXATION (ORIF) DISTAL HUMERUS FRACTURE WITH EXTENSION;  Surgeon: Bjorn Pippin, MD;  Location: [facility];  Service: Orthopedics;  Laterality:  Right;   PORTACATH PLACEMENT N/A 11/15/2022   Procedure: PORT PLACEMENT WITH ULTRASOUND GUIDANCE;  Surgeon: Harriette Bouillon, MD;  Location: [facility];  Service: General;  Laterality: N/A;   SIMPLE MASTECTOMY WITH AXILLARY SENTINEL NODE BIOPSY Bilateral 10/18/2022   Procedure: BILATERAL SIMPLE MASTECTOMY;  Surgeon: Harriette Bouillon, MD;  Location: [facility];  Service: General;  Laterality: Bilateral;  PEC BLOCK   TONSILLECTOMY AND ADENOIDECTOMY  1952?   Patient Active Problem List   Diagnosis Date Noted   Malignant neoplasm of upper-outer quadrant of left breast in female, estrogen receptor negative (HCC) 09/21/2022   Cancer determined by biopsy of breast (HCC) 09/12/2022   Atherosclerotic heart disease of native coronary artery with other forms of angina pectoris (HCC) 04/08/2022   Swelling of left foot 04/07/2022   Left hand fracture, sequela 04/07/2022   Depression with anxiety 04/07/2022   NSTEMI (non-ST elevated myocardial infarction) (HCC) 09/01/2020   Closed fracture of right distal humerus 04/10/2019   Dyspnea 05/09/2018   Vitamin B 12 deficiency 05/09/2018   Cough 03/06/2018   Senile osteoporosis 11/20/2017   Iron deficiency 04/26/2017   Lichen sclerosus 04/26/2017   External hemorrhoid 04/26/2017   Weakness of left leg 06/07/2016   Balance disorder 05/24/2016   Memory deficit 05/24/2016  Anemia 05/19/2016   Hearing loss 05/03/2016   Hypothyroidism 07/24/2013   Bradycardia on ECG 07/24/2013   Numbness 08/07/2012   HYPERLIPIDEMIA 12/29/2006    REFERRING PROVIDER: Dr. Harriette Bouillon  REFERRING DIAG: Left breast cancer  THERAPY DIAG:  Difficulty in walking, not elsewhere classified  Muscle weakness (generalized)  Stiffness of left shoulder, not elsewhere classified  Stiffness of right shoulder, not elsewhere classified  Aftercare following surgery for neoplasm  Abnormal posture  Malignant neoplasm of upper-outer quadrant of left breast in female,  estrogen receptor negative (HCC)  Rationale for Evaluation and Treatment: Rehabilitation  ONSET DATE: 10/18/2022  SUBJECTIVE:                                                                                                                                                                                           SUBJECTIVE STATEMENT: I used to be able to stand on one foot for 30 seconds.   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 She underwent a bilateral mastectomy and left sentinel node biopsy (7 negative nodes) on 10/18/2022. It is triple negative with a Ki67 of 5%. She has some cardiac history and had an elbow fracture in 2022 resulting in surgery where pins were placed.   PATIENT GOALS:  Reassess how my recovery is going related to arm function, pain, and swelling.  PAIN:  Are you having pain? No just sensitive  PRECAUTIONS: Recent Surgery, left UE Lymphedema risk  RED FLAGS: None   ACTIVITY LEVEL / LEISURE: She is walking half to 1 mile daily   OBJECTIVE:   PATIENT SURVEYS:  QUICK DASH:    OBSERVATIONS: Bilateral incisions appear to be healing well. There is skin overlap and edema present with tissue on the lateral aspects on her chest.  POSTURE:  Forward head and rounded shoulders   LYMPHEDEMA ASSESSMENT:   UPPER EXTREMITY AROM/PROM:   A/PROM RIGHT   eval   RIGHT 11/14/2022  Shoulder extension 63 60  Shoulder flexion 128 110  Shoulder abduction 137 136  Shoulder internal rotation 68 75  Shoulder external rotation 73 75                          (Blank rows = not tested)   A/PROM LEFT   eval LEFT 11/14/2022 LET  12/05/22  Shoulder extension 53 49   Shoulder flexion 135 109 154  Shoulder abduction 146 114 161  Shoulder internal rotation 74 77   Shoulder external rotation 78 70                           (  Blank rows = not tested)     UPPER EXTREMITY STRENGTH: WFL  LOWER EXTREMITY  STRENGTH:   MMT Right eval  Hip flexion 5/5  Hip extension 4/5  Hip abduction 4/5  Hip adduction   Hip internal rotation   Hip external rotation   Knee flexion 5/5  Knee extension 5/5  Ankle dorsiflexion 5/5  Ankle plantarflexion   Ankle inversion   Ankle eversion    (Blank rows = not tested)  A/PROM LEFT eval  Hip flexion 5/5  Hip extension 3/5  Hip abduction 4+/5  Hip adduction   Hip internal rotation   Hip external rotation   Knee flexion 5/5  Knee extension 5/5  Ankle dorsiflexion 5/5  Ankle plantarflexion   Ankle inversion   Ankle eversion    (Blank rows = not tested)  FUNCTIONAL TEST:  30 SEC SIT TO STAND: 9 reps which is below average for her age  BERG Balance Test: 44/56, anything less than 45 is a greater risk of falling  LYMPHEDEMA ASSESSMENTS: NOT ASSESSED AS PT IS NOT HAVING ANY LYMPH NODES REMOVED   LANDMARK RIGHT   eval RIGHT 11/14/2022  10 cm proximal to olecranon process  Not entered due pt was not originally having nodes removed 27.6  Olecranon process   24.3  10 cm proximal to ulnar styloid process   20.1  Just proximal to ulnar styloid process   15.1  Across hand at thumb web space   16.7  At base of 2nd digit   6.1  (Blank rows = not tested)   LANDMARK LEFT   eval LEFT 11/14/2022  10 cm proximal to olecranon process   Not entered due pt was not originally having nodes removed 26.4  Olecranon process   23.4  10 cm proximal to ulnar styloid process   19.5  Just proximal to ulnar styloid process   13.9  Across hand at thumb web space   16.4  At base of 2nd digit   6.1  (Blank rows = not tested) Surgery type/Date: 10/18/2022 Number of lymph nodes removed: 7 Current/past treatment (chemo, radiation, hormone therapy): none Other symptoms:  Heaviness/tightness No Pain Yes Pitting edema No Infections No Decreased scar mobility Yes Stemmer sign No   TODAY'S TREATMENT: Date: 12/07/22 Nustep level 5 x 10 min - seat at 8 no UEs with v/c to  keep knees from adducting, 490 steps 3 way hip machine: initially started with 10 reps of extension at 25 lbs but this was too easy so increased resistance to 40 lbs x 10 reps bilaterally in to flexion, abduction, and extension with pt feeling challenged by this IT band rolling bilaterally x 5 min Seated hamstring stretch x 30 sec holds x 2 reps bilaterally Quad stretch in // bars x 60 sec holds bilaterally Heel/toe walking on air ex beam x 4 initially with 2 HHA then down to finger tips Standing on 1 leg on air ex with finger tip assist on // bars with pt able to maintain about 1-3 sec holds Staining on air - 3 way hip with no resistance x 10 reps each with fingertip to HHA to challenge balance   12/05/22: Pulleys x 2 min in direction of flexion and 2 min in direction of abduction Discussed importance of doing supine scapular strengtheing exercises in supine for now due to compensation patterns in standing Educated pt in lymphedema risk reduction practices and issued handouts  11/30/22: Therapeutic Exercises Pulleys into flexion x 2 mins and then  in to abduction x 2 min Ball flexion x 10 on wall with v/c for proper form, abduction x 10 bilaterally with v/c for proper form Instructed pt in supine scapular strengthening exercises with yellow band x 10 reps each with pt returning therapist demo as follows: narrow and wide grip flexion, ER, horizontal abduction, and diagonals - pt did not have any increased pain with these - issued these as part of her HEP Manual Therapy P/ROM to bil shoulders into flexion, abd and ER to pts tolerance with therapist able to achieve full PROM MFR to Lt UE at area of cording from axilla to medial upper arm with numerous cords palpable MLD: Rt inguinal nodes, Rt axillo-inguinal anastomosis and then focused on edema inferior to Rt mastectomy   11/28/22: Therapeutic Exercises Pulleys into flexion x 2 mins and then in to abduction x 2 min Ball flexion x 10 on wall  with v/c for proper form, abduction x 10 bilaterally with v/c for proper form Manual Therapy P/ROM to bil shoulders into flexion, abd and ER to pts tolerance with therapist able to achieve full PROM MFR to Lt UE at area of cording from axilla to medial upper arm with numerous cords palpable MLD: Rt inguinal nodes, Rt axillo-inguinal anastomosis and then focused on edema inferior to Rt mastectomy instructing pt in this and having her return demo then repeated on L side which appears more swollen today Edema management: cut 1/2 grey foam and placed in small stockinette to wear across bilateral chest to decrease post op edema  11/21/22: Manual Therapy P/ROM to bil shoulders into flexion, abd and D2 to pts tolerance and with scapular depression by therapist throughout MFR to Lt UE at area of cording from axilla to medial upper arm MLD: Rt inguinal nodes, Rt axillo-inguinal anastomosis and then focused on edema inferior to Rt mastectomy instructing pt in this and having her return demo Therapeutic Exercises Pulleys into flex x 2:30 mins with VC's during to decrease scapular compensation  PATIENT EDUCATION:  Education details: HEP Person educated: Patient Education method: Medical illustrator Education comprehension: verbalized understanding and returned demonstration  HOME EXERCISE PROGRAM: Reviewed previously given post op HEP. SLS and tandem stance at counter   ASSESSMENT:  CLINICAL IMPRESSION: Began working on strengthening and high level balance today. She did very well with hip strengthening but felt challenged. She reported several periods of feeling a little dizzy when changing positions that went away quickly. Pt was very challenged with air ex exercises. Educated pt to work on tandem stance and single limb stance at home at [employer] for support. Will continue to work on decreasing reliance on // bars to improve balance.   Pt will benefit from skilled therapeutic intervention  to improve on the following deficits: Decreased knowledge of precautions, impaired UE functional use, pain, decreased ROM, postural dysfunction.   PT treatment/interventions: ADL/Self care home management, Therapeutic exercises, Therapeutic activity, Patient/Family education, Self Care, Manual lymph drainage, scar mobilization, Manual therapy, and Re-evaluation   GOALS: Goals reviewed with patient? Yes  LONG TERM GOALS:  (STG=LTG)  GOALS Name Target Date  Goal status  1 Pt will demonstrate she has regained full shoulder ROM and function post operatively compared to baselines.  Baseline: 12/12/2022 MET  12/05/22  2 Patient will report >/= 50% less pain and sensitivity in her chest midline to tolerate daily tasks with greater ease 12/12/2022 MET 12/05/22 at least 50% improved  3 Patient will improve her DASH score to be back to  baseline (4.55) for improved overall UE function. 12/12/2022 INITIAL  4 Patient will report good understanding of lymphedema risk reduction practices. 12/12/2022 ONGOING 12/05/22- re eductated pt and issued handouts  5 Pt will receive a compression sleeve to wear when she flies for prophylactic use. 01/02/23 NEW  6 Pt will be able to complete 12 sit to stands in 30 sec wihtout UEs to reduce fall risk. 01/02/23 NEW - current is 9  7 Pt will improve BERG balance score to 50 to decrease risk of falls. 01/02/23 NEW - current is 44     PLAN:  PT FREQUENCY/DURATION: 2x/week for 4 weeks  PLAN FOR NEXT SESSION: measure for compression sleeve, begin high level balance and update HEP,  begin scar massage when fully healed (2-3 more weeks ~9/30), MLD as needed   [facility]  [address]  [place] [address]  [phone]    Milagros Loll [place], PT 12/07/2022, 11:59 AM

## 2022-12-08 DIAGNOSIS — Z006 Encounter for examination for normal comparison and control in clinical research program: Secondary | ICD-10-CM

## 2022-12-08 MED ORDER — STUDY - OCEAN(A) - OLPASIRAN (AMG 890) 142 MG/ML OR PLACEBO SQ INJECTION (PI-HILTY)
142.0000 mg | PREFILLED_SYRINGE | Freq: Once | SUBCUTANEOUS | Status: AC
  Administered 2022-12-08: 142 mg via SUBCUTANEOUS
  Filled 2022-12-08: qty 1

## 2022-12-08 NOTE — Research (Addendum)
Jessica [name]  Week 72   Vitals: [x]  Experience any AE/SAE/Hospitalizations [x]   If yes please explain:   NO LABS AT NEXT 48 WEEK VISIT  Spoke with patient about lipid blinding and the importance.  Non-Fatal Potential Endpoint Assessment Yes  No   Has the subject experienced/undergone any of the following since the last visit/contact?   []   []    Any Coronary Artery Revascularization/Cerebrovascular Revascularization/ Peripheral Artery Revascularization/Amputation Procedure   []   [x]    Myocardial Infarction []   [x]    Stroke   []   [x]    Provide the date for the non-fatal Potential Endpoints status:   []   [x]    IP admin please see MAR (please add Box # to comment section on MAR) [x]   Jessica [name] is here for Week 72 of Jessica([name]). She reports no pain, no visits to the Ed or urgent care. She did have some meds added, but has not taken any of them yet.  These are for pain and nausea related to chemo.VS taken at 1014. Blood drawn at 1019. Injection given in right arm at 1051. Tol well. Scheduled next appointment for Dec 2 at 1000. She updated  genetic research consent with her initials.    Current Outpatient Medications:    acetaminophen (TYLENOL) 650 MG CR tablet, Take 650 mg by mouth as needed for pain., Disp: , Rfl:    ascorbic acid (VITAMIN C) 500 MG tablet, Take 500 mg by mouth daily., Disp: , Rfl:    Calcium Carb-Cholecalciferol (CALCIUM 600+D) 600-800 MG-UNIT TABS, Take 3 tablets by mouth daily., Disp: , Rfl:    clopidogrel (PLAVIX) 75 MG tablet, TAKE 1 TABLET BY MOUTH DAILY, Disp: 90 tablet, Rfl: 2   denosumab (PROLIA) 60 MG/ML SOSY injection, Inject 60 mg into the skin every 6 (six) months. Administered at [facility] q80m, Disp: , Rfl:    ferrous sulfate 325 (65 FE) MG tablet, Take 325 mg by mouth daily with breakfast., Disp: , Rfl:    gabapentin (NEURONTIN) 100 MG capsule, Take 100 mg by mouth daily., Disp: , Rfl:    levothyroxine (SYNTHROID) 112 MCG tablet, Take 1 tablet (112  mcg total) by mouth daily., Disp: 90 tablet, Rfl: 3   melatonin 5 MG TABS, Take 5 mg by mouth at bedtime., Disp: , Rfl:    rosuvastatin (CRESTOR) 20 MG tablet, TAKE 1 TABLET BY MOUTH DAILY, Disp: 30 tablet, Rfl: 6   Study - Jessica([name]) - olpasiran (AMG 890) 142 mg/mL or placebo SQ injection (PI-Hilty), Inject 142 mg into the skin once. For investigational use only - Inject 1 mL (1 prefilled syringe) subcutaneously into appropriate injection site per protocol. (Approved injection site(s): upper arm, upper thigh, abdomen). Please contact  Cardiology Research if any questions., Disp: , Rfl:    VITAMIN D PO, Take 1 capsule by mouth daily., Disp: , Rfl:    lidocaine-prilocaine (EMLA) cream, Apply to affected area once (Patient not taking: Reported on 11/16/2022), Disp: , Rfl:    loratadine (CLARITIN) 10 MG tablet, Take 10 mg by mouth as needed for allergies. (Patient not taking: Reported on 11/16/2022), Disp: , Rfl:    nitroGLYCERIN (NITROSTAT) 0.4 MG SL tablet, TAKE 1 TABLET UNDER THE TONGUE EVERY 5 MINUTES AS NEEDED FOR CHEST PAIN (Patient not taking: Reported on 11/16/2022), Disp: 25 tablet, Rfl: 6   ondansetron (ZOFRAN) 8 MG tablet, Take 1 tablet (8 mg total) by mouth every 8 (eight) hours as needed for nausea or vomiting. Start on the third day after  chemotherapy. (Patient not taking: Reported on 11/09/2022), Disp: 30 tablet, Rfl: 1   oxyCODONE (OXY IR/ROXICODONE) 5 MG immediate release tablet, Take 1 tablet (5 mg total) by mouth every 6 (six) hours as needed for severe pain. (Patient not taking: Reported on 11/03/2022), Disp: 15 tablet, Rfl: 0   prochlorperazine (COMPAZINE) 10 MG tablet, Take 1 tablet (10 mg total) by mouth every 6 (six) hours as needed for nausea or vomiting. (Patient not taking: Reported on 11/16/2022), Disp: 30 tablet, Rfl: 1

## 2022-12-13 ENCOUNTER — Ambulatory Visit: Payer: Medicare Other | Admitting: Physical Therapy

## 2022-12-13 ENCOUNTER — Ambulatory Visit: Payer: Medicare Other | Attending: Surgery | Admitting: Physical Therapy

## 2022-12-13 ENCOUNTER — Encounter: Payer: Self-pay | Admitting: Physical Therapy

## 2022-12-13 DIAGNOSIS — M6281 Muscle weakness (generalized): Secondary | ICD-10-CM

## 2022-12-13 DIAGNOSIS — R293 Abnormal posture: Secondary | ICD-10-CM | POA: Diagnosis not present

## 2022-12-13 DIAGNOSIS — M25612 Stiffness of left shoulder, not elsewhere classified: Secondary | ICD-10-CM

## 2022-12-13 DIAGNOSIS — R262 Difficulty in walking, not elsewhere classified: Secondary | ICD-10-CM

## 2022-12-13 DIAGNOSIS — Z483 Aftercare following surgery for neoplasm: Secondary | ICD-10-CM | POA: Diagnosis not present

## 2022-12-13 DIAGNOSIS — M25611 Stiffness of right shoulder, not elsewhere classified: Secondary | ICD-10-CM | POA: Diagnosis not present

## 2022-12-13 DIAGNOSIS — C50412 Malignant neoplasm of upper-outer quadrant of left female breast: Secondary | ICD-10-CM

## 2022-12-13 MED FILL — Dexamethasone Sodium Phosphate Inj 100 MG/10ML: INTRAMUSCULAR | Qty: 1 | Status: AC

## 2022-12-13 NOTE — Therapy (Signed)
OUTPATIENT PHYSICAL THERAPY BREAST CANCER TREATMENT   Patient Name: Jessica [name] MRN: [id number] DOB:Dec 07, 1940, 82 y.o., female Today's Date: 12/13/2022  END OF SESSION:  PT End of Session - 12/13/22 0849     Visit Number 8    Number of Visits 14    Date for PT Re-Evaluation 01/02/23    PT Start Time 0801    PT Stop Time 0849    PT Time Calculation (min) 48 min    Activity Tolerance Patient tolerated treatment well    Behavior During Therapy [facility] for tasks assessed/performed              Past Medical History:  Diagnosis Date   Anemia 12/2015   Bronchitis, chronic (HCC)    in her 65s   Cataract    Chronic kidney disease    in 20s had nephritis   Closed fracture of right distal humerus    right   Coronary artery disease    Hand, foot and mouth disease (HFMD)    Hearing loss 05/03/2016   High cholesterol    Hypothyroidism    Myocardial infarction (HCC)    Osteopenia    Pericarditis 01/1999   Pneumothorax 01/1999   Thyroid disorder    Vitamin B 12 deficiency    Past Surgical History:  Procedure Laterality Date   birthmark      removed,left leg   BREAST BIOPSY Left 09/13/2022   [place] LT BREAST BX W LOC DEV 1ST LESION IMG BX SPEC US GUIDE 09/13/2022 [facility]   CARDIAC CATHETERIZATION     CATARACT EXTRACTION  2017   CORONARY STENT INTERVENTION N/A 09/01/2020   Procedure: CORONARY STENT INTERVENTION;  Surgeon: Lyn Records, MD;  Location: [facility];  Service: Cardiovascular;  Laterality: N/A;   LEFT HEART CATH AND CORONARY ANGIOGRAPHY N/A 09/01/2020   Procedure: LEFT HEART CATH AND CORONARY ANGIOGRAPHY;  Surgeon: Lyn Records, MD;  Location: [facility];  Service: Cardiovascular;  Laterality: N/A;   ORIF HUMERUS FRACTURE Right 04/10/2019   Procedure: RIGHT OPEN REDUCTION INTERNAL FIXATION (ORIF) DISTAL HUMERUS FRACTURE WITH EXTENSION;  Surgeon: Bjorn Pippin, MD;  Location: [facility];  Service: Orthopedics;  Laterality:  Right;   PORTACATH PLACEMENT N/A 11/15/2022   Procedure: PORT PLACEMENT WITH ULTRASOUND GUIDANCE;  Surgeon: Harriette Bouillon, MD;  Location: [facility];  Service: General;  Laterality: N/A;   SIMPLE MASTECTOMY WITH AXILLARY SENTINEL NODE BIOPSY Bilateral 10/18/2022   Procedure: BILATERAL SIMPLE MASTECTOMY;  Surgeon: Harriette Bouillon, MD;  Location: [facility];  Service: General;  Laterality: Bilateral;  PEC BLOCK   TONSILLECTOMY AND ADENOIDECTOMY  1952?   Patient Active Problem List   Diagnosis Date Noted   Malignant neoplasm of upper-outer quadrant of left breast in female, estrogen receptor negative (HCC) 09/21/2022   Cancer determined by biopsy of breast (HCC) 09/12/2022   Atherosclerotic heart disease of native coronary artery with other forms of angina pectoris (HCC) 04/08/2022   Swelling of left foot 04/07/2022   Left hand fracture, sequela 04/07/2022   Depression with anxiety 04/07/2022   NSTEMI (non-ST elevated myocardial infarction) (HCC) 09/01/2020   Closed fracture of right distal humerus 04/10/2019   Dyspnea 05/09/2018   Vitamin B 12 deficiency 05/09/2018   Cough 03/06/2018   Senile osteoporosis 11/20/2017   Iron deficiency 04/26/2017   Lichen sclerosus 04/26/2017   External hemorrhoid 04/26/2017   Weakness of left leg 06/07/2016   Balance disorder 05/24/2016   Memory deficit  05/24/2016   Anemia 05/19/2016   Hearing loss 05/03/2016   Hypothyroidism 07/24/2013   Bradycardia on ECG 07/24/2013   Numbness 08/07/2012   HYPERLIPIDEMIA 12/29/2006    REFERRING PROVIDER: Dr. Harriette Bouillon  REFERRING DIAG: Left breast cancer  THERAPY DIAG:  Difficulty in walking, not elsewhere classified  Muscle weakness (generalized)  Stiffness of left shoulder, not elsewhere classified  Stiffness of right shoulder, not elsewhere classified  Aftercare following surgery for neoplasm  Abnormal posture  Malignant neoplasm of upper-outer quadrant of left breast in female,  estrogen receptor negative (HCC)  Rationale for Evaluation and Treatment: Rehabilitation  ONSET DATE: 10/18/2022  SUBJECTIVE:                                                                                                                                                                                           SUBJECTIVE STATEMENT: I was only able to sleep for 3.5 hours last night. I am more tired and wobbly.   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 She underwent a bilateral mastectomy and left sentinel node biopsy (7 negative nodes) on 10/18/2022. It is triple negative with a Ki67 of 5%. She has some cardiac history and had an elbow fracture in 2022 resulting in surgery where pins were placed.   PATIENT GOALS:  Reassess how my recovery is going related to arm function, pain, and swelling.  PAIN:  Are you having pain? No pt does report occasional pain near her port and in her breast that just "pings" and then is gone  PRECAUTIONS: Recent Surgery, left UE Lymphedema risk  RED FLAGS: None   ACTIVITY LEVEL / LEISURE: She is walking half to 1 mile daily   OBJECTIVE:   PATIENT SURVEYS:  QUICK DASH:    OBSERVATIONS: Bilateral incisions appear to be healing well. There is skin overlap and edema present with tissue on the lateral aspects on her chest.  POSTURE:  Forward head and rounded shoulders   LYMPHEDEMA ASSESSMENT:   UPPER EXTREMITY AROM/PROM:   A/PROM RIGHT   eval   RIGHT 11/14/2022  Shoulder extension 63 60  Shoulder flexion 128 110  Shoulder abduction 137 136  Shoulder internal rotation 68 75  Shoulder external rotation 73 75                          (Blank rows = not tested)   A/PROM LEFT   eval LEFT 11/14/2022 LET  12/05/22  Shoulder extension 53 49   Shoulder flexion 135 109 154  Shoulder abduction 146 114 161  Shoulder internal rotation 74 77   Shoulder external rotation 78 70                            (Blank rows = not tested)     UPPER EXTREMITY STRENGTH: WFL  LOWER EXTREMITY STRENGTH:   MMT Right eval  Hip flexion 5/5  Hip extension 4/5  Hip abduction 4/5  Hip adduction   Hip internal rotation   Hip external rotation   Knee flexion 5/5  Knee extension 5/5  Ankle dorsiflexion 5/5  Ankle plantarflexion   Ankle inversion   Ankle eversion    (Blank rows = not tested)  A/PROM LEFT eval  Hip flexion 5/5  Hip extension 3/5  Hip abduction 4+/5  Hip adduction   Hip internal rotation   Hip external rotation   Knee flexion 5/5  Knee extension 5/5  Ankle dorsiflexion 5/5  Ankle plantarflexion   Ankle inversion   Ankle eversion    (Blank rows = not tested)  FUNCTIONAL TEST:  30 SEC SIT TO STAND: 9 reps which is below average for her age  BERG Balance Test: 44/56, anything less than 45 is a greater risk of falling  LYMPHEDEMA ASSESSMENTS: NOT ASSESSED AS PT IS NOT HAVING ANY LYMPH NODES REMOVED   LANDMARK RIGHT   eval RIGHT 11/14/2022  10 cm proximal to olecranon process  Not entered due pt was not originally having nodes removed 27.6  Olecranon process   24.3  10 cm proximal to ulnar styloid process   20.1  Just proximal to ulnar styloid process   15.1  Across hand at thumb web space   16.7  At base of 2nd digit   6.1  (Blank rows = not tested)   LANDMARK LEFT   eval LEFT 11/14/2022  10 cm proximal to olecranon process   Not entered due pt was not originally having nodes removed 26.4  Olecranon process   23.4  10 cm proximal to ulnar styloid process   19.5  Just proximal to ulnar styloid process   13.9  Across hand at thumb web space   16.4  At base of 2nd digit   6.1  (Blank rows = not tested) Surgery type/Date: 10/18/2022 Number of lymph nodes removed: 7 Current/past treatment (chemo, radiation, hormone therapy): none Other symptoms:  Heaviness/tightness No Pain Yes Pitting edema No Infections No Decreased scar mobility Yes Stemmer  sign No   TODAY'S TREATMENT: Date: 12/13/22 Nustep level 5 x 10 min - seat at 8 no UEs with v/c to keep knees from adducting, 540 steps 3 way hip machine:  40 lbs x 10 reps bilaterally in to flexion, abduction, and extension with pt feeling challenged by this Leg press x 70 lbs x 20 reps with seat at 5 Heel/toe walking on air ex beam x 6 initially with 1 HHA then down to finger tips then to occasional finger touch on bars for support Sidestepping down airex balance beam x 4 reps with occasional finger tip touch on bars  Forward step ups x 10 reps bilaterally with pt feeling challenged by this with occasional finger touch on // bars Lateral steps ups x 10 reps bilaterally with occasional finger touch on bars with increased challenge with this Backwards step ups x 10 with 1 HHA on // bars with increased difficulty and need for assist to get her foot on step  12/07/22 Nustep level 5 x 10 min - seat at  8 no UEs with v/c to keep knees from adducting, 490 steps 3 way hip machine: initially started with 10 reps of extension at 25 lbs but this was too easy so increased resistance to 40 lbs x 10 reps bilaterally in to flexion, abduction, and extension with pt feeling challenged by this IT band rolling bilaterally x 5 min Seated hamstring stretch x 30 sec holds x 2 reps bilaterally Quad stretch in // bars x 60 sec holds bilaterally Heel/toe walking on air ex beam x 4 initially with 2 HHA then down to finger tips Standing on 1 leg on air ex with finger tip assist on // bars with pt able to maintain about 1-3 sec holds Staining on air - 3 way hip with no resistance x 10 reps each with fingertip to HHA to challenge balance   12/05/22: Pulleys x 2 min in direction of flexion and 2 min in direction of abduction Discussed importance of doing supine scapular strengtheing exercises in supine for now due to compensation patterns in standing Educated pt in lymphedema risk reduction practices and issued  handouts  11/30/22: Therapeutic Exercises Pulleys into flexion x 2 mins and then in to abduction x 2 min Ball flexion x 10 on wall with v/c for proper form, abduction x 10 bilaterally with v/c for proper form Instructed pt in supine scapular strengthening exercises with yellow band x 10 reps each with pt returning therapist demo as follows: narrow and wide grip flexion, ER, horizontal abduction, and diagonals - pt did not have any increased pain with these - issued these as part of her HEP Manual Therapy P/ROM to bil shoulders into flexion, abd and ER to pts tolerance with therapist able to achieve full PROM MFR to Lt UE at area of cording from axilla to medial upper arm with numerous cords palpable MLD: Rt inguinal nodes, Rt axillo-inguinal anastomosis and then focused on edema inferior to Rt mastectomy   11/28/22: Therapeutic Exercises Pulleys into flexion x 2 mins and then in to abduction x 2 min Ball flexion x 10 on wall with v/c for proper form, abduction x 10 bilaterally with v/c for proper form Manual Therapy P/ROM to bil shoulders into flexion, abd and ER to pts tolerance with therapist able to achieve full PROM MFR to Lt UE at area of cording from axilla to medial upper arm with numerous cords palpable MLD: Rt inguinal nodes, Rt axillo-inguinal anastomosis and then focused on edema inferior to Rt mastectomy instructing pt in this and having her return demo then repeated on L side which appears more swollen today Edema management: cut 1/2 grey foam and placed in small stockinette to wear across bilateral chest to decrease post op edema  11/21/22: Manual Therapy P/ROM to bil shoulders into flexion, abd and D2 to pts tolerance and with scapular depression by therapist throughout MFR to Lt UE at area of cording from axilla to medial upper arm MLD: Rt inguinal nodes, Rt axillo-inguinal anastomosis and then focused on edema inferior to Rt mastectomy instructing pt in this and having her  return demo Therapeutic Exercises Pulleys into flex x 2:30 mins with VC's during to decrease scapular compensation  PATIENT EDUCATION:  Education details: HEP Person educated: Patient Education method: Medical illustrator Education comprehension: verbalized understanding and returned demonstration  HOME EXERCISE PROGRAM: Reviewed previously given post op HEP. SLS and tandem stance at counter   ASSESSMENT:  CLINICAL IMPRESSION: Continued to work on strengthening and high level balance exercises today. Pt's tandem  walk on air ex started with 1 HHA on // bars then decreased to occasional finger touch on // for support. She felt very challenged by step ups especially in backwards direction. Next time will add hurdles as pt reports difficulty with stepping over things. She has been more fatigued and off balance since starting chemo and having difficulty sleeping. Educated pt on importance of continuing with exercise and walking to help decrease fatigue.   Pt will benefit from skilled therapeutic intervention to improve on the following deficits: Decreased knowledge of precautions, impaired UE functional use, pain, decreased ROM, postural dysfunction.   PT treatment/interventions: ADL/Self care home management, Therapeutic exercises, Therapeutic activity, Patient/Family education, Self Care, Manual lymph drainage, scar mobilization, Manual therapy, and Re-evaluation   GOALS: Goals reviewed with patient? Yes  LONG TERM GOALS:  (STG=LTG)  GOALS Name Target Date  Goal status  1 Pt will demonstrate she has regained full shoulder ROM and function post operatively compared to baselines.  Baseline: 12/12/2022 MET  12/05/22  2 Patient will report >/= 50% less pain and sensitivity in her chest midline to tolerate daily tasks with greater ease 12/12/2022 MET 12/05/22 at least 50% improved  3 Patient will improve her DASH score to be back to baseline (4.55) for improved overall UE function.  12/12/2022 INITIAL  4 Patient will report good understanding of lymphedema risk reduction practices. 12/12/2022 ONGOING 12/05/22- re eductated pt and issued handouts  5 Pt will receive a compression sleeve to wear when she flies for prophylactic use. 01/02/23 NEW  6 Pt will be able to complete 12 sit to stands in 30 sec wihtout UEs to reduce fall risk. 01/02/23 NEW - current is 9  7 Pt will improve BERG balance score to 50 to decrease risk of falls. 01/02/23 NEW - current is 44     PLAN:  PT FREQUENCY/DURATION: 2x/week for 4 weeks  PLAN FOR NEXT SESSION: add hurdles to balance exercises, measure for compression sleeve, begin high level balance and update HEP,  begin scar massage when fully healed (2-3 more weeks ~9/30), MLD as needed   [facility]  [address]  [place] [address]  [phone]    Milagros Loll [place], PT 12/13/2022, 8:59 AM

## 2022-12-14 ENCOUNTER — Inpatient Hospital Stay: Payer: Medicare Other

## 2022-12-14 ENCOUNTER — Inpatient Hospital Stay (HOSPITAL_BASED_OUTPATIENT_CLINIC_OR_DEPARTMENT_OTHER): Payer: Medicare Other | Admitting: Adult Health

## 2022-12-14 ENCOUNTER — Inpatient Hospital Stay: Payer: Medicare Other | Attending: Hematology and Oncology

## 2022-12-14 ENCOUNTER — Encounter: Payer: Self-pay | Admitting: Adult Health

## 2022-12-14 VITALS — BP 105/62 | HR 63 | Resp 18

## 2022-12-14 DIAGNOSIS — Z9012 Acquired absence of left breast and nipple: Secondary | ICD-10-CM | POA: Insufficient documentation

## 2022-12-14 DIAGNOSIS — R5383 Other fatigue: Secondary | ICD-10-CM | POA: Insufficient documentation

## 2022-12-14 DIAGNOSIS — C50412 Malignant neoplasm of upper-outer quadrant of left female breast: Secondary | ICD-10-CM

## 2022-12-14 DIAGNOSIS — Z171 Estrogen receptor negative status [ER-]: Secondary | ICD-10-CM | POA: Diagnosis not present

## 2022-12-14 DIAGNOSIS — Z5111 Encounter for antineoplastic chemotherapy: Secondary | ICD-10-CM | POA: Insufficient documentation

## 2022-12-14 LAB — CBC WITH DIFFERENTIAL (CANCER CENTER ONLY)
Abs Immature Granulocytes: 0.06 10*3/uL (ref 0.00–0.07)
Basophils Absolute: 0.1 10*3/uL (ref 0.0–0.1)
Basophils Relative: 1 %
Eosinophils Absolute: 0.2 10*3/uL (ref 0.0–0.5)
Eosinophils Relative: 3 %
HCT: 38.2 % (ref 36.0–46.0)
Hemoglobin: 12.8 g/dL (ref 12.0–15.0)
Immature Granulocytes: 1 %
Lymphocytes Relative: 32 %
Lymphs Abs: 1.7 10*3/uL (ref 0.7–4.0)
MCH: 33.2 pg (ref 26.0–34.0)
MCHC: 33.5 g/dL (ref 30.0–36.0)
MCV: 99.2 fL (ref 80.0–100.0)
Monocytes Absolute: 0.6 10*3/uL (ref 0.1–1.0)
Monocytes Relative: 10 %
Neutro Abs: 2.9 10*3/uL (ref 1.7–7.7)
Neutrophils Relative %: 53 %
Platelet Count: 294 10*3/uL (ref 150–400)
RBC: 3.85 MIL/uL — ABNORMAL LOW (ref 3.87–5.11)
RDW: 13 % (ref 11.5–15.5)
WBC Count: 5.5 10*3/uL (ref 4.0–10.5)
nRBC: 0 % (ref 0.0–0.2)

## 2022-12-14 LAB — CMP (CANCER CENTER ONLY)
ALT: 13 U/L (ref 0–44)
AST: 15 U/L (ref 15–41)
Albumin: 4 g/dL (ref 3.5–5.0)
Alkaline Phosphatase: 53 U/L (ref 38–126)
Anion gap: 5 (ref 5–15)
BUN: 11 mg/dL (ref 8–23)
CO2: 26 mmol/L (ref 22–32)
Calcium: 8.8 mg/dL — ABNORMAL LOW (ref 8.9–10.3)
Chloride: 109 mmol/L (ref 98–111)
Creatinine: 0.59 mg/dL (ref 0.44–1.00)
GFR, Estimated: >60 mL/min (ref >60)
Glucose, Bld: 133 mg/dL — ABNORMAL HIGH (ref 70–99)
Potassium: 4 mmol/L (ref 3.5–5.1)
Sodium: 140 mmol/L (ref 135–145)
Total Bilirubin: 0.4 mg/dL (ref 0.3–1.2)
Total Protein: 6.3 g/dL — ABNORMAL LOW (ref 6.5–8.1)

## 2022-12-14 MED ORDER — SODIUM CHLORIDE 0.9% FLUSH
10.0000 mL | Freq: Once | INTRAVENOUS | Status: AC
  Administered 2022-12-14: 10 mL

## 2022-12-14 MED ORDER — SODIUM CHLORIDE 0.9 % IV SOLN
10.0000 mg | Freq: Once | INTRAVENOUS | Status: AC
  Administered 2022-12-14: 10 mg via INTRAVENOUS
  Filled 2022-12-14: qty 10

## 2022-12-14 MED ORDER — SODIUM CHLORIDE 0.9 % IV SOLN
600.0000 mg/m2 | Freq: Once | INTRAVENOUS | Status: AC
  Administered 2022-12-14: 1000 mg via INTRAVENOUS
  Filled 2022-12-14: qty 50

## 2022-12-14 MED ORDER — HEPARIN SOD (PORK) LOCK FLUSH 100 UNIT/ML IV SOLN
500.0000 [IU] | Freq: Once | INTRAVENOUS | Status: AC | PRN
  Administered 2022-12-14: 500 [IU]

## 2022-12-14 MED ORDER — FLUOROURACIL CHEMO INJECTION 2.5 GM/50ML
600.0000 mg/m2 | Freq: Once | INTRAVENOUS | Status: AC
  Administered 2022-12-14: 1000 mg via INTRAVENOUS
  Filled 2022-12-14: qty 20

## 2022-12-14 MED ORDER — SODIUM CHLORIDE 0.9 % IV SOLN: Freq: Once | INTRAVENOUS | Status: AC

## 2022-12-14 MED ORDER — SODIUM CHLORIDE 0.9% FLUSH
10.0000 mL | INTRAVENOUS | Status: DC | PRN
  Administered 2022-12-14: 10 mL

## 2022-12-14 MED ORDER — PALONOSETRON HCL INJECTION 0.25 MG/5ML
0.2500 mg | Freq: Once | INTRAVENOUS | Status: AC
  Administered 2022-12-14: 0.25 mg via INTRAVENOUS
  Filled 2022-12-14: qty 5

## 2022-12-14 MED ORDER — METHOTREXATE SODIUM CHEMO INJECTION (PF) 50 MG/2ML
40.0000 mg/m2 | Freq: Once | INTRAMUSCULAR | Status: AC
  Administered 2022-12-14: 69.5 mg via INTRAVENOUS
  Filled 2022-12-14: qty 2.78

## 2022-12-14 NOTE — Progress Notes (Signed)
[facility] Cancer Follow up:    Jessica Gammon, MD [address] [place] [address]   DIAGNOSIS:  Cancer Staging  Malignant neoplasm of upper-outer quadrant of left breast in female, estrogen receptor negative (HCC) Staging form: Breast, AJCC 8th Edition - Clinical: Stage IIB (cT2, cN0, cM0, G2, ER-, PR-, HER2-) - Signed by Serena Croissant, MD on 09/21/2022 Stage prefix: Initial diagnosis Histologic grading system: 3 grade system   SUMMARY OF ONCOLOGIC HISTORY: Oncology History  Malignant neoplasm of upper-outer quadrant of left breast in female, estrogen receptor negative (HCC)  09/13/2022 Initial Diagnosis   Screening mammogram detected spiculated left breast mass upper outer quadrant 5 o'clock position measuring 2.1 cm, axilla negative, biopsy revealed 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 Indeterminate 1.2 cm right breast mass    09/30/2022 Initial Biopsy   Right breast biopsy demonstrates intraductal papilloma and no evidence of atypia or malignancy.   10/18/2022 Surgery   Left mastectomy scheduled with Dr. Luisa Hart   10/18/2022 Surgery   Left mastectomy: Grade 2 invasive lobular cancer 2.7 cm with DCIS, margins negative, suspicious for LVI, ER 0%, PR 0%, HER2 negative, Ki-67 5%, 0/7 lymph nodes Right mastectomy: Benign   11/23/2022 -  Chemotherapy   Patient is on Treatment Plan : BREAST Adjuvant CMF IV q21d       CURRENT THERAPY: CMF  INTERVAL HISTORY: Jessica [name] 82 y.o. female returns for evaluation prior to receiving her second cycle of  CMF chemotherapy.  She is tolerating it very well.  She has not noted a significant amount of issues with taking the medication.  She brought some questions about her diet and activity.  She has noted some increased fatigue.    Patient Active Problem List   Diagnosis Date Noted   Malignant neoplasm of upper-outer quadrant of left breast in female, estrogen receptor negative (HCC) 09/21/2022   Atherosclerotic heart disease of native coronary artery with other forms of angina pectoris (HCC) 04/08/2022   Swelling of left foot 04/07/2022   Left hand fracture, sequela 04/07/2022   Depression with anxiety 04/07/2022   NSTEMI (non-ST elevated myocardial infarction) (HCC) 09/01/2020   Closed fracture of right distal humerus 04/10/2019   Dyspnea 05/09/2018   Vitamin B 12 deficiency 05/09/2018   Cough 03/06/2018   Senile osteoporosis 11/20/2017   Iron deficiency 04/26/2017   Lichen sclerosus 04/26/2017   External hemorrhoid 04/26/2017   Weakness of left leg 06/07/2016   Balance disorder 05/24/2016   Memory deficit 05/24/2016   Anemia 05/19/2016   Hearing loss 05/03/2016   Hypothyroidism 07/24/2013   Bradycardia on ECG 07/24/2013   Numbness 08/07/2012   HYPERLIPIDEMIA 12/29/2006    has No Known Allergies.  MEDICAL HISTORY: Past Medical History:  Diagnosis Date   Anemia 12/2015   Bronchitis, chronic (HCC)    in her 25s   Cataract    Chronic kidney disease    in 84s had nephritis   Closed fracture of right distal humerus    right   Coronary artery disease  Hand, foot and mouth disease (HFMD)    Hearing loss 05/03/2016   High cholesterol    Hypothyroidism    Myocardial infarction (HCC)    Osteopenia    Pericarditis 01/1999   Pneumothorax 01/1999   Thyroid disorder    Vitamin B 12 deficiency     SURGICAL HISTORY: Past Surgical History:  Procedure Laterality Date   birthmark      removed,left leg   BREAST BIOPSY Left 09/13/2022   [place] LT BREAST BX W LOC DEV 1ST LESION IMG  BX SPEC US GUIDE 09/13/2022 [facility] MAMMOGRAPHY   CARDIAC CATHETERIZATION     CATARACT EXTRACTION  2017   CORONARY STENT INTERVENTION N/A 09/01/2020   Procedure: CORONARY STENT INTERVENTION;  Surgeon: Lyn Records, MD;  Location: [facility];  Service: Cardiovascular;  Laterality: N/A;   LEFT HEART CATH AND CORONARY ANGIOGRAPHY N/A 09/01/2020   Procedure: LEFT HEART CATH AND CORONARY ANGIOGRAPHY;  Surgeon: Lyn Records, MD;  Location: [facility];  Service: Cardiovascular;  Laterality: N/A;   ORIF HUMERUS FRACTURE Right 04/10/2019   Procedure: RIGHT OPEN REDUCTION INTERNAL FIXATION (ORIF) DISTAL HUMERUS FRACTURE WITH EXTENSION;  Surgeon: Bjorn Pippin, MD;  Location: [facility];  Service: Orthopedics;  Laterality: Right;   PORTACATH PLACEMENT N/A 11/15/2022   Procedure: PORT PLACEMENT WITH ULTRASOUND GUIDANCE;  Surgeon: Harriette Bouillon, MD;  Location: [facility] OR;  Service: General;  Laterality: N/A;   SIMPLE MASTECTOMY WITH AXILLARY SENTINEL NODE BIOPSY Bilateral 10/18/2022   Procedure: BILATERAL SIMPLE MASTECTOMY;  Surgeon: Harriette Bouillon, MD;  Location: [facility];  Service: General;  Laterality: Bilateral;  PEC BLOCK   TONSILLECTOMY AND ADENOIDECTOMY  1952?    SOCIAL HISTORY: Social History   Socioeconomic History   Marital status: Divorced    Spouse name: Not on file   Number of children: 1   Years of education: college   Highest education level: Not on file  Occupational History   Occupation: retired  Tobacco Use   Smoking status: Never   Smokeless tobacco: Never  Vaping Use   Vaping status: Never Used  Substance and Sexual Activity   Alcohol use: Yes    Alcohol/week: 3.0 standard drinks of alcohol    Types: 3 Glasses of wine per week    Comment: social   Drug use: No   Sexual activity: Not on file  Other Topics Concern   Not on file  Social History Narrative   Moved to [facility] 01/14/2015   Do you drink/eat things with  caffeine?  yes   Marital status?      divorced                               Do you live in a house, apartment, assisted living, condo, trailer, etc.?  Retirement community   Is it one or more stories? Yes, 3   How many persons live in your home?  1   Do you have any pets in your home? (please list) 0   Current or past profession:  Librarian   Do you exercise?     yes                                 Type & how often? Walk-varies   Do you have a living will? yes   Do you have  a DNR form?         no                         If not, do you want to discuss one? yes   Do you have signed POA/HPOA for forms? yes   Social Determinants of Health   Financial Resource Strain: Low Risk  (07/11/2022)   Overall Financial Resource Strain (CARDIA)    Difficulty of Paying Living Expenses: Not hard at all  Food Insecurity: No Food Insecurity (07/11/2022)   Hunger Vital Sign    Worried About Running Out of Food in the Last Year: Never true    Ran Out of Food in the Last Year: Never true  Transportation Needs: No Transportation Needs (03/29/2017)   PRAPARE - Administrator, Civil Service (Medical): No    Lack of Transportation (Non-Medical): No  Physical Activity: Insufficiently Active (07/11/2022)   Exercise Vital Sign    Days of Exercise per Week: 3 days    Minutes of Exercise per Session: 30 min  Stress: No Stress Concern Present (07/11/2022)   [employer] of Occupational Health - Occupational Stress Questionnaire    Feeling of Stress : Only a little  Social Connections: Moderately Integrated (07/11/2022)   Social Connection and Isolation Panel [NHANES]    Frequency of Communication with Friends and Family: Once a week    Frequency of Social Gatherings with Friends and Family: Three times a week    Attends Religious Services: More than 4 times per year    Active Member of Clubs or Organizations: Yes    Attends Banker Meetings: Never    Marital Status: Widowed  Intimate  Partner Violence: Not At Risk (07/11/2022)   Humiliation, Afraid, Rape, and Kick questionnaire    Fear of Current or Ex-Partner: No    Emotionally Abused: No    Physically Abused: No    Sexually Abused: No    FAMILY HISTORY: Family History  Problem Relation Age of Onset   Diabetes Mother    Asthma Mother    Ataxia Father    Stroke Father    Cancer - Cervical Sister    Thyroid disease Sister    Diabetes Maternal Grandmother    Arthritis Paternal Grandmother    Diabetes Other    Colon cancer Neg Hx    Breast cancer Neg Hx     Review of Systems  Constitutional:  Positive for fatigue. Negative for appetite change, chills, fever and unexpected weight change.  HENT:   Negative for hearing loss, lump/mass and trouble swallowing.   Eyes:  Negative for eye problems and icterus.  Respiratory:  Negative for chest tightness, cough and shortness of breath.   Cardiovascular:  Negative for chest pain, leg swelling and palpitations.  Gastrointestinal:  Negative for abdominal distention, abdominal pain, constipation, diarrhea, nausea and vomiting.  Endocrine: Negative for hot flashes.  Genitourinary:  Negative for difficulty urinating.   Musculoskeletal:  Negative for arthralgias.  Skin:  Negative for itching and rash.  Neurological:  Negative for dizziness, extremity weakness, headaches and numbness.  Hematological:  Negative for adenopathy. Does not bruise/bleed easily.  Psychiatric/Behavioral:  Negative for depression. The patient is not nervous/anxious.       PHYSICAL EXAMINATION    Vitals:   12/14/22 1052  BP: 123/67  Pulse: 63  Resp: 18  Temp: (!) 97.2 F (36.2 C)  SpO2: 97%    Physical Exam Constitutional:  General: She is not in acute distress.    Appearance: Normal appearance. She is not toxic-appearing.  HENT:     Head: Normocephalic and atraumatic.     Mouth/Throat:     Mouth: Mucous membranes are moist.     Pharynx: Oropharynx is clear. No oropharyngeal  exudate or posterior oropharyngeal erythema.  Eyes:     General: No scleral icterus. Cardiovascular:     Rate and Rhythm: Normal rate and regular rhythm.     Pulses: Normal pulses.     Heart sounds: Normal heart sounds.  Pulmonary:     Effort: Pulmonary effort is normal.     Breath sounds: Normal breath sounds.  Abdominal:     General: Abdomen is flat. Bowel sounds are normal. There is no distension.     Palpations: Abdomen is soft.     Tenderness: There is no abdominal tenderness.  Musculoskeletal:        General: No swelling.     Cervical back: Neck supple.  Lymphadenopathy:     Cervical: No cervical adenopathy.  Skin:    General: Skin is warm and dry.     Findings: No rash.  Neurological:     General: No focal deficit present.     Mental Status: She is alert.  Psychiatric:        Mood and Affect: Mood normal.        Behavior: Behavior normal.     LABORATORY DATA:  CBC    Component Value Date/Time   WBC 5.5 12/14/2022 1004   WBC 8.3 11/09/2022 0814   RBC 3.85 (L) 12/14/2022 1004   HGB 12.8 12/14/2022 1004   HGB 13.0 05/06/2021 1548   HGB 13.1 04/27/2017 0000   HCT 38.2 12/14/2022 1004   HCT 38.5 05/06/2021 1548   PLT 294 12/14/2022 1004   PLT 265 05/06/2021 1548   MCV 99.2 12/14/2022 1004   MCV 97 05/06/2021 1548   MCH 33.2 12/14/2022 1004   MCHC 33.5 12/14/2022 1004   RDW 13.0 12/14/2022 1004   RDW 12.9 05/06/2021 1548   LYMPHSABS 1.7 12/14/2022 1004   MONOABS 0.6 12/14/2022 1004   EOSABS 0.2 12/14/2022 1004   BASOSABS 0.1 12/14/2022 1004    CMP     Component Value Date/Time   NA 140 12/14/2022 1004   NA 146 (H) 05/06/2021 1548   NA 140 04/27/2017 0000   K 4.0 12/14/2022 1004   K 4.3 04/27/2017 0000   CL 109 12/14/2022 1004   CO2 26 12/14/2022 1004   GLUCOSE 133 (H) 12/14/2022 1004   BUN 11 12/14/2022 1004   BUN 16 05/06/2021 1548   CREATININE 0.59 12/14/2022 1004   CREATININE 0.58 (L) 05/30/2022 0811   CALCIUM 8.8 (L) 12/14/2022 1004    CALCIUM 9.0 04/27/2017 0000   PROT 6.3 (L) 12/14/2022 1004   PROT 6.1 03/16/2021 0920   ALBUMIN 4.0 12/14/2022 1004   ALBUMIN 4.2 03/16/2021 0920   ALBUMIN 3.9 04/27/2017 0000   AST 15 12/14/2022 1004   ALT 13 12/14/2022 1004   ALT 16 04/27/2017 0000   ALKPHOS 53 12/14/2022 1004   ALKPHOS 64 04/27/2017 0000   BILITOT 0.4 12/14/2022 1004   GFRNONAA >60 12/14/2022 1004   GFRNONAA 89 07/23/2020 0815   GFRAA 103 07/23/2020 0815     ASSESSMENT and THERAPY PLAN:   Malignant neoplasm of upper-outer quadrant of left breast in female, estrogen receptor negative (HCC) 09/13/2022: Screening mammogram detected spiculated left breast mass upper outer quadrant 5  o'clock position measuring 2.1 cm, axilla negative, 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  Indeterminate 1.2 cm right breast mass   Recommendation: 10/18/2022:Left mastectomy: Grade 2 invasive lobular cancer 2.7 cm with DCIS, margins negative, suspicious for LVI, ER 0%, PR 0%, HER2 negative, Ki-67 5%, 0/7 lymph nodes Right mastectomy: Benign adjuvant CMF x 6 cycles Adjuvant radiation therapy   _______________________________________________________________________________________________________________________________________________________________  Current treatment: CMF cycle 2   She is tolerating treatment well.   Treatment related fatigue: managing with energy conservation We reviewed diet, exercise, and lifestyle recommendations during chemotherapy Her labs are stable she will proceed with treatment without dose modifications.    RTC in 3 weeks for labs, f/u, and her next treatment.    All questions were answered. The patient knows to call the clinic with any problems, questions or concerns. We can certainly see the patient much sooner if necessary.  Total  encounter time:40 minutes*in face-to-face visit time, chart review, lab review, care coordination, order entry, and documentation of the encounter time.  Lillard Anes, NP 12/17/22 2:03 PM Medical Oncology and Hematology [facility] [address] [place], [place] [address] Tel. [phone]    Fax. [phone]  *Total Encounter Time as defined by the Centers for Medicare and Medicaid Services includes, in addition to the face-to-face time of a patient visit (documented in the note above) non-face-to-face time: obtaining and reviewing outside history, ordering and reviewing medications, tests or procedures, care coordination (communications with other health care professionals or caregivers) and documentation in the medical record.

## 2022-12-14 NOTE — Patient Instructions (Signed)
[facility]  Discharge Instructions: Thank you for choosing [facility] to provide your oncology and hematology care.   If you have a lab appointment with the Cancer Center, please go directly to the Cancer Center and check in at the registration area.   Wear comfortable clothing and clothing appropriate for easy access to any Portacath or PICC line.   We strive to give you quality time with your provider. You may need to reschedule your appointment if you arrive late (15 or more minutes).  Arriving late affects you and other patients whose appointments are after yours.  Also, if you miss three or more appointments without notifying the office, you may be dismissed from the clinic at the provider's discretion.      For prescription refill requests, have your pharmacy contact our office and allow 72 hours for refills to be completed.    Today you received the following chemotherapy and/or immunotherapy agents: Cytoxan / Methotrexate / Fluorouracil   To help prevent nausea and vomiting after your treatment, we encourage you to take your nausea medication as directed.  BELOW ARE SYMPTOMS THAT SHOULD BE REPORTED IMMEDIATELY: *FEVER GREATER THAN 100.4 F (38 C) OR HIGHER *CHILLS OR SWEATING *NAUSEA AND VOMITING THAT IS NOT CONTROLLED WITH YOUR NAUSEA MEDICATION *UNUSUAL SHORTNESS OF BREATH *UNUSUAL BRUISING OR BLEEDING *URINARY PROBLEMS (pain or burning when urinating, or frequent urination) *BOWEL PROBLEMS (unusual diarrhea, constipation, pain near the anus) TENDERNESS IN MOUTH AND THROAT WITH OR WITHOUT PRESENCE OF ULCERS (sore throat, sores in mouth, or a toothache) UNUSUAL RASH, SWELLING OR PAIN  UNUSUAL VAGINAL DISCHARGE OR ITCHING   Items with * indicate a potential emergency and should be followed up as soon as possible or go to the Emergency Department if any problems should occur.  Please show the CHEMOTHERAPY ALERT CARD or  IMMUNOTHERAPY ALERT CARD at check-in to the Emergency Department and triage nurse.  Should you have questions after your visit or need to cancel or reschedule your appointment, please contact [facility]  Dept: [phone]  and follow the prompts.  Office hours are 8:00 a.m. to 4:30 p.m. Monday - Friday. Please note that voicemails left after 4:00 p.m. may not be returned until the following business day.  We are closed weekends and major holidays. You have access to a nurse at all times for urgent questions. Please call the main number to the clinic Dept: [phone] and follow the prompts.   For any non-urgent questions, you may also contact your provider using MyChart. We now offer e-Visits for anyone 60 and older to request care online for non-urgent symptoms. For details visit [email or web address].   Also download the MyChart app! Go to the app store, search "MyChart", open the app, select [facility], and log in with your MyChart username and password.

## 2022-12-15 ENCOUNTER — Encounter: Payer: Medicare Other | Admitting: Physical Therapy

## 2022-12-15 ENCOUNTER — Telehealth: Payer: Self-pay | Admitting: Adult Health

## 2022-12-15 NOTE — Telephone Encounter (Signed)
Patient is wanting to move infusion date from 10/30 to 10/31 patient already has conflicting appointment, patient stated they would work the other appointments out if possible to reschedule them

## 2022-12-17 ENCOUNTER — Encounter: Payer: Self-pay | Admitting: Hematology and Oncology

## 2022-12-17 NOTE — Assessment & Plan Note (Signed)
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  Indeterminate 1.2 cm right breast mass   Recommendation: 10/18/2022:Left mastectomy: Grade 2 invasive lobular cancer 2.7 cm with DCIS, margins negative, suspicious for LVI, ER 0%, PR 0%, HER2 negative, Ki-67 5%, 0/7 lymph nodes Right mastectomy: Benign adjuvant CMF x 6 cycles Adjuvant radiation therapy   _______________________________________________________________________________________________________________________________________________________________  Current treatment: CMF cycle 2   She is tolerating treatment well.   Treatment related fatigue: managing with energy conservation We reviewed diet, exercise, and lifestyle recommendations during chemotherapy Her labs are stable she will proceed with treatment without dose modifications.    RTC in 3 weeks for labs, f/u, and her next treatment.

## 2022-12-19 ENCOUNTER — Ambulatory Visit: Payer: Medicare Other | Admitting: Physical Therapy

## 2022-12-19 ENCOUNTER — Encounter: Payer: Self-pay | Admitting: Physical Therapy

## 2022-12-19 DIAGNOSIS — R293 Abnormal posture: Secondary | ICD-10-CM | POA: Diagnosis not present

## 2022-12-19 DIAGNOSIS — M25612 Stiffness of left shoulder, not elsewhere classified: Secondary | ICD-10-CM

## 2022-12-19 DIAGNOSIS — C50412 Malignant neoplasm of upper-outer quadrant of left female breast: Secondary | ICD-10-CM

## 2022-12-19 DIAGNOSIS — R262 Difficulty in walking, not elsewhere classified: Secondary | ICD-10-CM

## 2022-12-19 DIAGNOSIS — M25611 Stiffness of right shoulder, not elsewhere classified: Secondary | ICD-10-CM | POA: Diagnosis not present

## 2022-12-19 DIAGNOSIS — Z483 Aftercare following surgery for neoplasm: Secondary | ICD-10-CM | POA: Diagnosis not present

## 2022-12-19 DIAGNOSIS — M6281 Muscle weakness (generalized): Secondary | ICD-10-CM | POA: Diagnosis not present

## 2022-12-19 NOTE — Research (Addendum)
Are there any labs that are clinically significant?  Yes []  OR No[x]   Please FORWARD back to me with any changes or follow up!   Chrystie Nose, MD, [facility], FACP  [place]  [facility]  Medical Director of the [facility] Lipid [facility] &  Cardiovascular Risk Reduction Clinic Diplomate of the American Board of Clinical Lipidology Attending Cardiologist  Direct Dial: [phone]  Fax: [phone]  Website:  [email or web address]     ACCESSION NO. [id number]                                             Page 1 of 1                                                        INVESTIGATOR: ([id number])                          PROTOCOL   [id number]                     Zoila Shutter M.D.                             INVESTIGATOR NO.: [id number]                     c/o Claretta Fraise                              SUBJECT ID: [id number]                     [facility]                 SUBJECT INITIALS NOT COLLECTED:                     [address] [place] 6U440                 VISIT: [place], [place] [id number]                   SPONSOR REPORT TO:                 COLLECTION TIME:10:19 DATE:08-Dec-2022                     Mertha Baars                      DATE RECEIVED IN LABORATORY: 09-Dec-2022                     c/o Sponsor(or Addtl) [employer].Study DATE REPORTED BY LABORATORY: 12-Dec-2022                     [facility]  SEX: F  BIRTHDATE:  04-Sep-1940    AGE: 46W                     [address]                  [place] NO.: [phone], [place] [address]                                                                                        Ref. Ranges               Clinical    Comments                                                                          Significance                                                                            Yes*  No                    [employer]                       APTT           24.8         21.9-29.4 sec                                PT             10.5         9.7-12.3 sec                                 INR            1.0          Patient not taking                                                       oral anticoagulant:  0.8 - 1.2                                                                Patient taking                                                          oral anticoagulant:                                                      2.0 - 3.0                                  ALT & INR                      ALT & INR      Criteria not met                                        AST & INR                      AST & INR      Criteria not met                         ALT > 3 X ULN                      ALT>3XULN      Criteria not met                                        ALT > 5 X ULN                      ALT>5XULN      Criteria not met                                        ALT > 8 X ULN                      ALT>8XULN      Criteria not met                                        ALT & TBIL                      ALT & TBIL  Criteria not met                                        AST > 3 X ULN                      AST>3XULN      Criteria not met                                        AST > 5 X ULN                      AST>5XULN      Criteria not met                                        AST > 8 X ULN                      AST>8XULN      Criteria not met                                        AST & TBIL                      AST & TBIL     Criteria not met                         SM [id number] ANTIBODY COLL D/T                      CDate PreD     08-Dec-2022                                               CTime PreD     10:19                                     CHEMISTRY PANEL                      Total Bili     0.4          0.2-1.2 mg/dL                                 Dir Bili       0.1          0.0-0.4 mg/dL                                Ind Bili       0.3          0.0-1.2 mg/dL  Alk Phos       73           35-104 U/L                                   ALT (SGPT)     14           4-43 U/L                                    AST (SGOT)     15           8-40 U/L                                    Urea Nitr      8            4-34 mg/dL                                   Creatinine     0.62         0.35-1.45 mg/dL                              Calcium        8.9          8.3-10.6 mg/dL                              Total Prot     6.2          6.0-8.0 g/dL                                 Alb BCG        4.1          3.0-4.6 g/dL                                 CK             47           26-192 U/L                                   Sodium         141          135-145 mEq/L                                Potassium      4.4          3.5-5.2 mEq/L                                Bicarb         24.0         19.3-29.3 mEq/L  Chloride       105          94-112 mEq/L                               ADJUSTED CALCIUM                      Adj Calc       8.9          8.3-10.6 mg/dL              HEMATOLOGY&DIFFERENTIAL PANEL                      HGB            13.2         11.5-15.8 g/dL                              HCT            40           34-48 %                                      RBC            4.0          3.9-5.5 x106/uL                              MCH            33           26-34 pg                                    MCHC           33           31-38 g/dL                                   RDW            13.4         12.0-15.0 %                                 RBC Morph      No Review Required                                       MCV            100          80-100 fL                                    WBC            5.43         3.80-10.70 x103/uL  Neutrophil     3.29          1.96-7.23 x103/uL                           Lymphocyte     1.57         0.80-3.00 x103/uL                           Monocytes      0.41         0.12-0.92 x103/uL                           Eosinophil     0.12         0.00-0.57 x103/uL                           Basophils      0.03         0.00-0.20 x103/uL                           Neutrophil     60.7         40.5-75.0 %                                 Lymphocyte     28.9         15.4-48.5%                                   Monocytes      7.6          2.6-10.1 %                                   Eosinophil     2.2          0.0-6.8 %                                    Basophils      0.5          0.0-2.0 %                                    Platelets      235          130-394 x103/uL                            ANC                      ANC            3.29         1.96-7.23 x103/uL         ANION GAP                      Anion Gap      16  7-18 mEq/L      EGFR                      CKDEPI eGF     89           mL/min/1.70m2                                                            No Ref Rng

## 2022-12-19 NOTE — Therapy (Signed)
OUTPATIENT PHYSICAL THERAPY BREAST CANCER TREATMENT   Patient Name: Jessica [name] MRN: [id number] DOB:08/02/1940, 82 y.o., female Today's Date: 12/19/2022  END OF SESSION:  PT End of Session - 12/19/22 0909     Visit Number 9    Number of Visits 14    Date for PT Re-Evaluation 01/02/23    PT Start Time 0903    PT Stop Time 0955    PT Time Calculation (min) 52 min    Activity Tolerance Patient tolerated treatment well    Behavior During Therapy [facility] for tasks assessed/performed              Past Medical History:  Diagnosis Date   Anemia 12/2015   Bronchitis, chronic (HCC)    in her 16s   Cataract    Chronic kidney disease    in 20s had nephritis   Closed fracture of right distal humerus    right   Coronary artery disease    Hand, foot and mouth disease (HFMD)    Hearing loss 05/03/2016   High cholesterol    Hypothyroidism    Myocardial infarction (HCC)    Osteopenia    Pericarditis 01/1999   Pneumothorax 01/1999   Thyroid disorder    Vitamin B 12 deficiency    Past Surgical History:  Procedure Laterality Date   birthmark      removed,left leg   BREAST BIOPSY Left 09/13/2022   [place] LT BREAST BX W LOC DEV 1ST LESION IMG BX SPEC US GUIDE 09/13/2022 [facility] MAMMO[facility]APHY   CARDIAC CATHETERIZATION     CATARACT EXTRACTION  2017   CORONARY STENT INTERVENTION N/A 09/01/2020   Procedure: CORONARY STENT INTERVENTION;  Surgeon: Lyn Records, MD;  Location: [facility];  Service: Cardiovascular;  Laterality: N/A;   LEFT HEART CATH AND CORONARY ANGIOGRAPHY N/A 09/01/2020   Procedure: LEFT HEART CATH AND CORONARY ANGIOGRAPHY;  Surgeon: Lyn Records, MD;  Location: [facility];  Service: Cardiovascular;  Laterality: N/A;   ORIF HUMERUS FRACTURE Right 04/10/2019   Procedure: RIGHT OPEN REDUCTION INTERNAL FIXATION (ORIF) DISTAL HUMERUS FRACTURE WITH EXTENSION;  Surgeon: Bjorn Pippin, MD;  Location: [facility];  Service: Orthopedics;  Laterality:  Right;   PORTACATH PLACEMENT N/A 11/15/2022   Procedure: PORT PLACEMENT WITH ULTRASOUND GUIDANCE;  Surgeon: Harriette Bouillon, MD;  Location: [facility];  Service: General;  Laterality: N/A;   SIMPLE MASTECTOMY WITH AXILLARY SENTINEL NODE BIOPSY Bilateral 10/18/2022   Procedure: BILATERAL SIMPLE MASTECTOMY;  Surgeon: Harriette Bouillon, MD;  Location: [facility];  Service: General;  Laterality: Bilateral;  PEC BLOCK   TONSILLECTOMY AND ADENOIDECTOMY  1952?   Patient Active Problem List   Diagnosis Date Noted   Malignant neoplasm of upper-outer quadrant of left breast in female, estrogen receptor negative (HCC) 09/21/2022   Atherosclerotic heart disease of native coronary artery with other forms of angina pectoris (HCC) 04/08/2022   Swelling of left foot 04/07/2022   Left hand fracture, sequela 04/07/2022   Depression with anxiety 04/07/2022   NSTEMI (non-ST elevated myocardial infarction) (HCC) 09/01/2020   Closed fracture of right distal humerus 04/10/2019   Dyspnea 05/09/2018   Vitamin B 12 deficiency 05/09/2018   Cough 03/06/2018   Senile osteoporosis 11/20/2017   Iron deficiency 04/26/2017   Lichen sclerosus 04/26/2017   External hemorrhoid 04/26/2017   Weakness of left leg 06/07/2016   Balance disorder 05/24/2016   Memory deficit 05/24/2016   Anemia 05/19/2016   Hearing loss 05/03/2016  Hypothyroidism 07/24/2013   Bradycardia on ECG 07/24/2013   Numbness 08/07/2012   HYPERLIPIDEMIA 12/29/2006    REFERRING PROVIDER: Dr. Harriette Bouillon  REFERRING DIAG: Left breast cancer  THERAPY DIAG:  Difficulty in walking, not elsewhere classified  Muscle weakness (generalized)  Stiffness of left shoulder, not elsewhere classified  Stiffness of right shoulder, not elsewhere classified  Aftercare following surgery for neoplasm  Abnormal posture  Malignant neoplasm of upper-outer quadrant of left breast in female, estrogen receptor negative (HCC)  Rationale for Evaluation  and Treatment: Rehabilitation  ONSET DATE: 10/18/2022  SUBJECTIVE:                                                                                                                                                                                           SUBJECTIVE STATEMENT: I am still not sleeping and feeling very wobbly.   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 She underwent a bilateral mastectomy and left sentinel node biopsy (7 negative nodes) on 10/18/2022. It is triple negative with a Ki67 of 5%. She has some cardiac history and had an elbow fracture in 2022 resulting in surgery where pins were placed.   PATIENT GOALS:  Reassess how my recovery is going related to arm function, pain, and swelling.  PAIN:  Are you having pain? No   PRECAUTIONS: Recent Surgery, left UE Lymphedema risk  RED FLAGS: None   ACTIVITY LEVEL / LEISURE: She is walking half to 1 mile daily   OBJECTIVE:   PATIENT SURVEYS:  QUICK DASH:    OBSERVATIONS: Bilateral incisions appear to be healing well. There is skin overlap and edema present with tissue on the lateral aspects on her chest.  POSTURE:  Forward head and rounded shoulders   LYMPHEDEMA ASSESSMENT:   UPPER EXTREMITY AROM/PROM:   A/PROM RIGHT   eval   RIGHT 11/14/2022  Shoulder extension 63 60  Shoulder flexion 128 110  Shoulder abduction 137 136  Shoulder internal rotation 68 75  Shoulder external rotation 73 75                          (Blank rows = not tested)   A/PROM LEFT   eval LEFT 11/14/2022 LET  12/05/22  Shoulder extension 53 49   Shoulder flexion 135 109 154  Shoulder abduction 146 114 161  Shoulder internal rotation 74 77   Shoulder external rotation 78 70                           (  Blank rows = not tested)     UPPER EXTREMITY STRENGTH: WFL  LOWER EXTREMITY STRENGTH:   MMT Right eval  Hip flexion 5/5  Hip extension 4/5  Hip abduction  4/5  Hip adduction   Hip internal rotation   Hip external rotation   Knee flexion 5/5  Knee extension 5/5  Ankle dorsiflexion 5/5  Ankle plantarflexion   Ankle inversion   Ankle eversion    (Blank rows = not tested)  A/PROM LEFT eval  Hip flexion 5/5  Hip extension 3/5  Hip abduction 4+/5  Hip adduction   Hip internal rotation   Hip external rotation   Knee flexion 5/5  Knee extension 5/5  Ankle dorsiflexion 5/5  Ankle plantarflexion   Ankle inversion   Ankle eversion    (Blank rows = not tested)  FUNCTIONAL TEST:  30 SEC SIT TO STAND: 9 reps which is below average for her age  BERG Balance Test: 44/56, anything less than 45 is a greater risk of falling  LYMPHEDEMA ASSESSMENTS: NOT ASSESSED AS PT IS NOT HAVING ANY LYMPH NODES REMOVED   LANDMARK RIGHT   eval RIGHT 11/14/2022  10 cm proximal to olecranon process  Not entered due pt was not originally having nodes removed 27.6  Olecranon process   24.3  10 cm proximal to ulnar styloid process   20.1  Just proximal to ulnar styloid process   15.1  Across hand at thumb web space   16.7  At base of 2nd digit   6.1  (Blank rows = not tested)   LANDMARK LEFT   eval LEFT 11/14/2022  10 cm proximal to olecranon process   Not entered due pt was not originally having nodes removed 26.4  Olecranon process   23.4  10 cm proximal to ulnar styloid process   19.5  Just proximal to ulnar styloid process   13.9  Across hand at thumb web space   16.4  At base of 2nd digit   6.1  (Blank rows = not tested) Surgery type/Date: 10/18/2022 Number of lymph nodes removed: 7 Current/past treatment (chemo, radiation, hormone therapy): none Other symptoms:  Heaviness/tightness No Pain Yes Pitting edema No Infections No Decreased scar mobility Yes Stemmer sign No   TODAY'S TREATMENT: Date: 12/19/22 Nustep level 3 x 10 min - seat at 8 no UEs with v/c to keep knees from adducting, 540 steps 3 way hip machine:  45 lbs x 10 reps  bilaterally in to flexion, abduction, and extension with pt feeling challenged by this Leg press x 70 lbs x 20 reps with seat at 5 Supine knee to chest stretch x 15 sec hold x 3 Supine trunk rotation stretch x 30 sec holds to stretch R side and decrease back pain Seated edge of mat: hamstring stretch x 30 sec holds x 2 reps bilaterally Piriformis stretch x 30 sec holds bilaterally x 2 Stepping over hurdles x 8 with occasional inability to clear hurdles- pt felt challenged by this especially while talking and walking Forward step ups x 10 reps bilaterally with pt with occasional loss of balance and need for CGA Lateral steps ups x 10 reps bilaterally with occasional loss of balance that required min assist to correct and CGA- minA to Assessed bilateral lateral trunk lymphedema and her L is still considerably swollen- assessed compression bra and it was ill fitting so suggested wearing the [employer] bra instead - educated pt to discuss swelling with her surgeon at her appt on 10/21  12/13/22 Nustep level 5 x 10 min - seat at 8 no UEs with v/c to keep knees from adducting, 540 steps 3 way hip machine:  40 lbs x 10 reps bilaterally in to flexion, abduction, and extension with pt feeling challenged by this Leg press x 70 lbs x 20 reps with seat at 5 Heel/toe walking on air ex beam x 6 initially with 1 HHA then down to finger tips then to occasional finger touch on bars for support Sidestepping down airex balance beam x 4 reps with occasional finger tip touch on bars  Forward step ups x 10 reps bilaterally with pt feeling challenged by this with occasional finger touch on // bars Lateral steps ups x 10 reps bilaterally with occasional finger touch on bars with increased challenge with this Backwards step ups x 10 with 1 HHA on // bars with increased difficulty and need for assist to get her foot on step  12/07/22 Nustep level 5 x 10 min - seat at 8 no UEs with v/c to keep knees from adducting, 490  steps 3 way hip machine: initially started with 10 reps of extension at 25 lbs but this was too easy so increased resistance to 40 lbs x 10 reps bilaterally in to flexion, abduction, and extension with pt feeling challenged by this IT band rolling bilaterally x 5 min Seated hamstring stretch x 30 sec holds x 2 reps bilaterally Quad stretch in // bars x 60 sec holds bilaterally Heel/toe walking on air ex beam x 4 initially with 2 HHA then down to finger tips Standing on 1 leg on air ex with finger tip assist on // bars with pt able to maintain about 1-3 sec holds Staining on air - 3 way hip with no resistance x 10 reps each with fingertip to HHA to challenge balance   12/05/22: Pulleys x 2 min in direction of flexion and 2 min in direction of abduction Discussed importance of doing supine scapular strengtheing exercises in supine for now due to compensation patterns in standing Educated pt in lymphedema risk reduction practices and issued handouts  11/30/22: Therapeutic Exercises Pulleys into flexion x 2 mins and then in to abduction x 2 min Ball flexion x 10 on wall with v/c for proper form, abduction x 10 bilaterally with v/c for proper form Instructed pt in supine scapular strengthening exercises with yellow band x 10 reps each with pt returning therapist demo as follows: narrow and wide grip flexion, ER, horizontal abduction, and diagonals - pt did not have any increased pain with these - issued these as part of her HEP Manual Therapy P/ROM to bil shoulders into flexion, abd and ER to pts tolerance with therapist able to achieve full PROM MFR to Lt UE at area of cording from axilla to medial upper arm with numerous cords palpable MLD: Rt inguinal nodes, Rt axillo-inguinal anastomosis and then focused on edema inferior to Rt mastectomy   11/28/22: Therapeutic Exercises Pulleys into flexion x 2 mins and then in to abduction x 2 min Ball flexion x 10 on wall with v/c for proper form,  abduction x 10 bilaterally with v/c for proper form Manual Therapy P/ROM to bil shoulders into flexion, abd and ER to pts tolerance with therapist able to achieve full PROM MFR to Lt UE at area of cording from axilla to medial upper arm with numerous cords palpable MLD: Rt inguinal nodes, Rt axillo-inguinal anastomosis and then focused on edema inferior to Rt mastectomy instructing pt  in this and having her return demo then repeated on L side which appears more swollen today Edema management: cut 1/2 grey foam and placed in small stockinette to wear across bilateral chest to decrease post op edema  11/21/22: Manual Therapy P/ROM to bil shoulders into flexion, abd and D2 to pts tolerance and with scapular depression by therapist throughout MFR to Lt UE at area of cording from axilla to medial upper arm MLD: Rt inguinal nodes, Rt axillo-inguinal anastomosis and then focused on edema inferior to Rt mastectomy instructing pt in this and having her return demo Therapeutic Exercises Pulleys into flex x 2:30 mins with VC's during to decrease scapular compensation  PATIENT EDUCATION:  Education details: HEP Person educated: Patient Education method: Medical illustrator Education comprehension: verbalized understanding and returned demonstration  HOME EXERCISE PROGRAM: Reviewed previously given post op HEP. SLS and tandem stance at counter   ASSESSMENT:  CLINICAL IMPRESSION: Continued to work on strengthening and high level balance exercises today. Pt reports low back pain so added stretches today to see if this helps. Pt reports she still is not sleeping well. Encouraged pt to discuss this with her doctor as pt reports she is only sleeping 3 hours a night. Pt required min A today at times with step ups due to decreased balance. 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 Pt has a follow up with her surgeon in a week so educated pt to  discuss this with him.   Pt will benefit from skilled therapeutic intervention to improve on the following deficits: Decreased knowledge of precautions, impaired UE functional use, pain, decreased ROM, postural dysfunction.   PT treatment/interventions: ADL/Self care home management, Therapeutic exercises, Therapeutic activity, Patient/Family education, Self Care, Manual lymph drainage, scar mobilization, Manual therapy, and Re-evaluation   GOALS: Goals reviewed with patient? Yes  LONG TERM GOALS:  (STG=LTG)  GOALS Name Target Date  Goal status  1 Pt will demonstrate she has regained full shoulder ROM and function post operatively compared to baselines.  Baseline: 12/12/2022 MET  12/05/22  2 Patient will report >/= 50% less pain and sensitivity in her chest midline to tolerate daily tasks with greater ease 12/12/2022 MET 12/05/22 at least 50% improved  3 Patient will improve her DASH score to be back to baseline (4.55) for improved overall UE function. 12/12/2022 INITIAL  4 Patient will report good understanding of lymphedema risk reduction practices. 12/12/2022 ONGOING 12/05/22- re eductated pt and issued handouts  5 Pt will receive a compression sleeve to wear when she flies for prophylactic use. 01/02/23 NEW  6 Pt will be able to complete 12 sit to stands in 30 sec wihtout UEs to reduce fall risk. 01/02/23 NEW - current is 9  7 Pt will improve BERG balance score to 50 to decrease risk of falls. 01/02/23 NEW - current is 44     PLAN:  PT FREQUENCY/DURATION: 2x/week for 4 weeks  PLAN FOR NEXT SESSION: continue hurdles to balance exercises, measure for compression sleeve, begin high level balance and update HEP,  begin scar massage when fully healed (2-3 more weeks ~9/30), MLD as needed   [facility]  [address]  [place] [address]  [phone]    Milagros Loll Greenevers, PT 12/19/2022, 10:12 AM

## 2022-12-21 ENCOUNTER — Ambulatory Visit: Payer: Medicare Other | Admitting: Physical Therapy

## 2022-12-21 ENCOUNTER — Encounter: Payer: Self-pay | Admitting: Physical Therapy

## 2022-12-21 DIAGNOSIS — M25612 Stiffness of left shoulder, not elsewhere classified: Secondary | ICD-10-CM

## 2022-12-21 DIAGNOSIS — R262 Difficulty in walking, not elsewhere classified: Secondary | ICD-10-CM

## 2022-12-21 DIAGNOSIS — M6281 Muscle weakness (generalized): Secondary | ICD-10-CM | POA: Diagnosis not present

## 2022-12-21 DIAGNOSIS — M25611 Stiffness of right shoulder, not elsewhere classified: Secondary | ICD-10-CM

## 2022-12-21 DIAGNOSIS — Z483 Aftercare following surgery for neoplasm: Secondary | ICD-10-CM | POA: Diagnosis not present

## 2022-12-21 DIAGNOSIS — R293 Abnormal posture: Secondary | ICD-10-CM

## 2022-12-21 DIAGNOSIS — Z171 Estrogen receptor negative status [ER-]: Secondary | ICD-10-CM

## 2022-12-21 NOTE — Therapy (Signed)
OUTPATIENT PHYSICAL THERAPY BREAST CANCER TREATMENT  Progress Note Reporting Period 10/17/22 to 12/21/22  See note below for Objective Data and Assessment of Progress/Goals.      Patient Name: Jessica [name] MRN: [id number] DOB:11-21-1940, 82 y.o., female Today's Date: 12/21/2022  END OF SESSION:  PT End of Session - 12/21/22 0906     Visit Number 10    Number of Visits 14    Date for PT Re-Evaluation 01/02/23    PT Start Time 0903    PT Stop Time 0958    PT Time Calculation (min) 55 min    Activity Tolerance Patient tolerated treatment well    Behavior During Therapy [employer] for tasks assessed/performed              Past Medical History:  Diagnosis Date   Anemia 12/2015   Bronchitis, chronic (HCC)    in her 33s   Cataract    Chronic kidney disease    in 20s had nephritis   Closed fracture of right distal humerus    right   Coronary artery disease    Hand, foot and mouth disease (HFMD)    Hearing loss 05/03/2016   High cholesterol    Hypothyroidism    Myocardial infarction (HCC)    Osteopenia    Pericarditis 01/1999   Pneumothorax 01/1999   Thyroid disorder    Vitamin B 12 deficiency    Past Surgical History:  Procedure Laterality Date   birthmark      removed,left leg   BREAST BIOPSY Left 09/13/2022   [place] LT BREAST BX W LOC DEV 1ST LESION IMG BX SPEC US GUIDE 09/13/2022 [facility]   CARDIAC CATHETERIZATION     CATARACT EXTRACTION  2017   CORONARY STENT INTERVENTION N/A 09/01/2020   Procedure: CORONARY STENT INTERVENTION;  Surgeon: Lyn Records, MD;  Location: [facility];  Service: Cardiovascular;  Laterality: N/A;   LEFT HEART CATH AND CORONARY ANGIOGRAPHY N/A 09/01/2020   Procedure: LEFT HEART CATH AND CORONARY ANGIOGRAPHY;  Surgeon: Lyn Records, MD;  Location: [facility];  Service: Cardiovascular;  Laterality: N/A;   ORIF HUMERUS FRACTURE Right 04/10/2019   Procedure: RIGHT OPEN REDUCTION INTERNAL FIXATION (ORIF) DISTAL  HUMERUS FRACTURE WITH EXTENSION;  Surgeon: Bjorn Pippin, MD;  Location: [facility];  Service: Orthopedics;  Laterality: Right;   PORTACATH PLACEMENT N/A 11/15/2022   Procedure: PORT PLACEMENT WITH ULTRASOUND GUIDANCE;  Surgeon: Harriette Bouillon, MD;  Location: [facility];  Service: General;  Laterality: N/A;   SIMPLE MASTECTOMY WITH AXILLARY SENTINEL NODE BIOPSY Bilateral 10/18/2022   Procedure: BILATERAL SIMPLE MASTECTOMY;  Surgeon: Harriette Bouillon, MD;  Location: [facility];  Service: General;  Laterality: Bilateral;  PEC BLOCK   TONSILLECTOMY AND ADENOIDECTOMY  1952?   Patient Active Problem List   Diagnosis Date Noted   Malignant neoplasm of upper-outer quadrant of left breast in female, estrogen receptor negative (HCC) 09/21/2022   Atherosclerotic heart disease of native coronary artery with other forms of angina pectoris (HCC) 04/08/2022   Swelling of left foot 04/07/2022   Left hand fracture, sequela 04/07/2022   Depression with anxiety 04/07/2022   NSTEMI (non-ST elevated myocardial infarction) (HCC) 09/01/2020   Closed fracture of right distal humerus 04/10/2019   Dyspnea 05/09/2018   Vitamin B 12 deficiency 05/09/2018   Cough 03/06/2018   Senile osteoporosis 11/20/2017   Iron deficiency 04/26/2017   Lichen sclerosus 04/26/2017   External hemorrhoid 04/26/2017   Weakness of  left leg 06/07/2016   Balance disorder 05/24/2016   Memory deficit 05/24/2016   Anemia 05/19/2016   Hearing loss 05/03/2016   Hypothyroidism 07/24/2013   Bradycardia on ECG 07/24/2013   Numbness 08/07/2012   HYPERLIPIDEMIA 12/29/2006    REFERRING PROVIDER: Dr. Harriette Bouillon  REFERRING DIAG: Left breast cancer  THERAPY DIAG:  Difficulty in walking, not elsewhere classified  Muscle weakness (generalized)  Stiffness of left shoulder, not elsewhere classified  Stiffness of right shoulder, not elsewhere classified  Aftercare following surgery for neoplasm  Abnormal  posture  Malignant neoplasm of upper-outer quadrant of left breast in female, estrogen receptor negative (HCC)  Rationale for Evaluation and Treatment: Rehabilitation  ONSET DATE: 10/18/2022  SUBJECTIVE:                                                                                                                                                                                           SUBJECTIVE STATEMENT: That pain in my low back/hip is getting worse. It gets better as the day goes on. I accidentally took my night meds this morning. I feel fine because they are low dose. When I am sitting there is not any pain.   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 She underwent a bilateral mastectomy and left sentinel node biopsy (7 negative nodes) on 10/18/2022. It is triple negative with a Ki67 of 5%. She has some cardiac history and had an elbow fracture in 2022 resulting in surgery where pins were placed.   PATIENT GOALS:  Reassess how my recovery is going related to arm function, pain, and swelling.  PAIN:  Are you having pain? Yes: NPRS scale: 2/10 Pain location: R hip area Pain description: sharp Aggravating factors: sit to stand, bed mobility Relieving factors: walking, moving around    PRECAUTIONS: Recent Surgery, left UE Lymphedema risk  RED FLAGS: None   ACTIVITY LEVEL / LEISURE: She is walking half to 1 mile daily   OBJECTIVE:   PATIENT SURVEYS:  QUICK DASH:    OBSERVATIONS: Bilateral incisions appear to be healing well. There is skin overlap and edema present with tissue on the lateral aspects on her chest.  POSTURE:  Forward head and rounded shoulders   LYMPHEDEMA ASSESSMENT:   UPPER EXTREMITY AROM/PROM:   A/PROM RIGHT   eval   RIGHT 11/14/2022  Shoulder extension 63 60  Shoulder flexion 128 110  Shoulder abduction 137 136  Shoulder internal rotation 68 75  Shoulder external rotation  73 75                          (  Blank rows = not tested)   A/PROM LEFT   eval LEFT 11/14/2022 LET  12/05/22  Shoulder extension 53 49   Shoulder flexion 135 109 154  Shoulder abduction 146 114 161  Shoulder internal rotation 74 77   Shoulder external rotation 78 70                           (Blank rows = not tested)     UPPER EXTREMITY STRENGTH: WFL  LOWER EXTREMITY STRENGTH:   MMT Right eval  Hip flexion 5/5  Hip extension 4/5  Hip abduction 4/5  Hip adduction   Hip internal rotation   Hip external rotation   Knee flexion 5/5  Knee extension 5/5  Ankle dorsiflexion 5/5  Ankle plantarflexion   Ankle inversion   Ankle eversion    (Blank rows = not tested)  A/PROM LEFT eval  Hip flexion 5/5  Hip extension 3/5  Hip abduction 4+/5  Hip adduction   Hip internal rotation   Hip external rotation   Knee flexion 5/5  Knee extension 5/5  Ankle dorsiflexion 5/5  Ankle plantarflexion   Ankle inversion   Ankle eversion    (Blank rows = not tested)  FUNCTIONAL TEST:  30 SEC SIT TO STAND: 9 reps which is below average for her age  BERG Balance Test: 44/56, anything less than 45 is a greater risk of falling  LYMPHEDEMA ASSESSMENTS: NOT ASSESSED AS PT IS NOT HAVING ANY LYMPH NODES REMOVED   LANDMARK RIGHT   eval RIGHT 11/14/2022  10 cm proximal to olecranon process  Not entered due pt was not originally having nodes removed 27.6  Olecranon process   24.3  10 cm proximal to ulnar styloid process   20.1  Just proximal to ulnar styloid process   15.1  Across hand at thumb web space   16.7  At base of 2nd digit   6.1  (Blank rows = not tested)   LANDMARK LEFT   eval LEFT 11/14/2022  10 cm proximal to olecranon process   Not entered due pt was not originally having nodes removed 26.4  Olecranon process   23.4  10 cm proximal to ulnar styloid process   19.5  Just proximal to ulnar styloid process   13.9  Across hand at thumb web space   16.4  At base of 2nd digit   6.1   (Blank rows = not tested) Surgery type/Date: 10/18/2022 Number of lymph nodes removed: 7 Current/past treatment (chemo, radiation, hormone therapy): none Other symptoms:  Heaviness/tightness No Pain Yes Pitting edema No Infections No Decreased scar mobility Yes Stemmer sign No   TODAY'S TREATMENT: Date: 12/21/22 Sidelying on L side: STM to R lower back/SI joint area with increased muscle tightness palpable, then use suction cup with cocoa butter moivng in all directions while also pulling upwards to improve blood flow and decrease tightness Supine bridging x 20 reps with v/c to keep knees hip width apart - pt reported decreased discomfort after this Supine in hook lying with folded pillow between knees and gait belt around thighs alternating between isometric hip abduction and adduction with 5 sec holds x 5 reps each  Nustep level 2 x 10 min - seat at 8 no UEs with v/c to keep knees from adducting, 512 steps  12/19/22 Nustep level 3 x 10 min - seat at 8 no UEs with v/c to keep knees from adducting, 540 steps 3 way hip machine:  45 lbs x 10 reps bilaterally in to flexion, abduction, and extension with pt feeling challenged by this Leg press x 70 lbs x 20 reps with seat at 5 Supine knee to chest stretch x 15 sec hold x 3 Supine trunk rotation stretch x 30 sec holds to stretch R side and decrease back pain Seated edge of mat: hamstring stretch x 30 sec holds x 2 reps bilaterally Piriformis stretch x 30 sec holds bilaterally x 2 Stepping over hurdles x 8 with occasional inability to clear hurdles- pt felt challenged by this especially while talking and walking Forward step ups x 10 reps bilaterally with pt with occasional loss of balance and need for CGA Lateral steps ups x 10 reps bilaterally with occasional loss of balance that required min assist to correct and CGA- minA to Assessed bilateral lateral trunk lymphedema and her L is still considerably swollen- assessed compression bra  and it was ill fitting so suggested wearing the [employer] bra instead - educated pt to discuss swelling with her surgeon at her appt on 10/21  12/13/22 Nustep level 5 x 10 min - seat at 8 no UEs with v/c to keep knees from adducting, 540 steps 3 way hip machine:  40 lbs x 10 reps bilaterally in to flexion, abduction, and extension with pt feeling challenged by this Leg press x 70 lbs x 20 reps with seat at 5 Heel/toe walking on air ex beam x 6 initially with 1 HHA then down to finger tips then to occasional finger touch on bars for support Sidestepping down airex balance beam x 4 reps with occasional finger tip touch on bars  Forward step ups x 10 reps bilaterally with pt feeling challenged by this with occasional finger touch on // bars Lateral steps ups x 10 reps bilaterally with occasional finger touch on bars with increased challenge with this Backwards step ups x 10 with 1 HHA on // bars with increased difficulty and need for assist to get her foot on step  12/07/22 Nustep level 5 x 10 min - seat at 8 no UEs with v/c to keep knees from adducting, 490 steps 3 way hip machine: initially started with 10 reps of extension at 25 lbs but this was too easy so increased resistance to 40 lbs x 10 reps bilaterally in to flexion, abduction, and extension with pt feeling challenged by this IT band rolling bilaterally x 5 min Seated hamstring stretch x 30 sec holds x 2 reps bilaterally Quad stretch in // bars x 60 sec holds bilaterally Heel/toe walking on air ex beam x 4 initially with 2 HHA then down to finger tips Standing on 1 leg on air ex with finger tip assist on // bars with pt able to maintain about 1-3 sec holds Staining on air - 3 way hip with no resistance x 10 reps each with fingertip to HHA to challenge balance   12/05/22: Pulleys x 2 min in direction of flexion and 2 min in direction of abduction Discussed importance of doing supine scapular strengtheing exercises in supine for now  due to compensation patterns in standing Educated pt in lymphedema risk reduction practices and issued handouts  11/30/22: Therapeutic Exercises Pulleys into flexion x 2 mins and then in to abduction x 2 min Ball flexion x 10 on wall with v/c for proper form, abduction x 10 bilaterally with v/c for proper form Instructed pt in supine scapular strengthening exercises with yellow band x 10 reps each with pt  returning therapist demo as follows: narrow and wide grip flexion, ER, horizontal abduction, and diagonals - pt did not have any increased pain with these - issued these as part of her HEP Manual Therapy P/ROM to bil shoulders into flexion, abd and ER to pts tolerance with therapist able to achieve full PROM MFR to Lt UE at area of cording from axilla to medial upper arm with numerous cords palpable MLD: Rt inguinal nodes, Rt axillo-inguinal anastomosis and then focused on edema inferior to Rt mastectomy   11/28/22: Therapeutic Exercises Pulleys into flexion x 2 mins and then in to abduction x 2 min Ball flexion x 10 on wall with v/c for proper form, abduction x 10 bilaterally with v/c for proper form Manual Therapy P/ROM to bil shoulders into flexion, abd and ER to pts tolerance with therapist able to achieve full PROM MFR to Lt UE at area of cording from axilla to medial upper arm with numerous cords palpable MLD: Rt inguinal nodes, Rt axillo-inguinal anastomosis and then focused on edema inferior to Rt mastectomy instructing pt in this and having her return demo then repeated on L side which appears more swollen today Edema management: cut 1/2 grey foam and placed in small stockinette to wear across bilateral chest to decrease post op edema  11/21/22: Manual Therapy P/ROM to bil shoulders into flexion, abd and D2 to pts tolerance and with scapular depression by therapist throughout MFR to Lt UE at area of cording from axilla to medial upper arm MLD: Rt inguinal nodes, Rt axillo-inguinal  anastomosis and then focused on edema inferior to Rt mastectomy instructing pt in this and having her return demo Therapeutic Exercises Pulleys into flex x 2:30 mins with VC's during to decrease scapular compensation  PATIENT EDUCATION:  Education details: HEP Person educated: Patient Education method: Medical illustrator Education comprehension: verbalized understanding and returned demonstration  HOME EXERCISE PROGRAM: Reviewed previously given post op HEP. SLS and tandem stance at counter  Bridging SI joint stabilization in to isometric abduction/adduction  ASSESSMENT:  CLINICAL IMPRESSION: Focused today on decreasing R hip pain and SI joint pain that has worsened over the last few weeks. Educated pt in SI joint stabilization exercises today and performed STM to the area as well as gentle suction cupping to improve blood flow.   Pt will benefit from skilled therapeutic intervention to improve on the following deficits: Decreased knowledge of precautions, impaired UE functional use, pain, decreased ROM, postural dysfunction.   PT treatment/interventions: ADL/Self care home management, Therapeutic exercises, Therapeutic activity, Patient/Family education, Self Care, Manual lymph drainage, scar mobilization, Manual therapy, and Re-evaluation   GOALS: Goals reviewed with patient? Yes  LONG TERM GOALS:  (STG=LTG)  GOALS Name Target Date  Goal status  1 Pt will demonstrate she has regained full shoulder ROM and function post operatively compared to baselines.  Baseline: 12/12/2022 MET  12/05/22  2 Patient will report >/= 50% less pain and sensitivity in her chest midline to tolerate daily tasks with greater ease 12/12/2022 MET 12/05/22 at least 50% improved  3 Patient will improve her DASH score to be back to baseline (4.55) for improved overall UE function. 12/12/2022 INITIAL  4 Patient will report good understanding of lymphedema risk reduction practices. 12/12/2022 ONGOING  12/05/22- re eductated pt and issued handouts  5 Pt will receive a compression sleeve to wear when she flies for prophylactic use. 01/02/23 NEW  6 Pt will be able to complete 12 sit to stands in 30 sec wihtout  UEs to reduce fall risk. 01/02/23 NEW - current is 9  7 Pt will improve BERG balance score to 50 to decrease risk of falls. 01/02/23 NEW - current is 44     PLAN:  PT FREQUENCY/DURATION: 2x/week for 4 weeks  PLAN FOR NEXT SESSION: continue hurdles to balance exercises, measure for compression sleeve, begin high level balance and update HEP,  begin scar massage when fully healed (2-3 more weeks ~9/30), MLD as needed   [facility]  [address]  [place] [address]  [phone]    Milagros Loll [place], PT 12/21/2022, 10:02 AM

## 2022-12-26 ENCOUNTER — Encounter: Payer: Self-pay | Admitting: Physical Therapy

## 2022-12-26 ENCOUNTER — Ambulatory Visit: Payer: Medicare Other | Admitting: Physical Therapy

## 2022-12-26 DIAGNOSIS — Z483 Aftercare following surgery for neoplasm: Secondary | ICD-10-CM

## 2022-12-26 DIAGNOSIS — M25612 Stiffness of left shoulder, not elsewhere classified: Secondary | ICD-10-CM | POA: Diagnosis not present

## 2022-12-26 DIAGNOSIS — R293 Abnormal posture: Secondary | ICD-10-CM

## 2022-12-26 DIAGNOSIS — M25611 Stiffness of right shoulder, not elsewhere classified: Secondary | ICD-10-CM | POA: Diagnosis not present

## 2022-12-26 DIAGNOSIS — R262 Difficulty in walking, not elsewhere classified: Secondary | ICD-10-CM | POA: Diagnosis not present

## 2022-12-26 DIAGNOSIS — M6281 Muscle weakness (generalized): Secondary | ICD-10-CM

## 2022-12-26 DIAGNOSIS — Z171 Estrogen receptor negative status [ER-]: Secondary | ICD-10-CM

## 2022-12-26 NOTE — Therapy (Signed)
OUTPATIENT PHYSICAL THERAPY BREAST CANCER TREATMENT   Patient Name: Jessica [name] MRN: [id number] DOB:1940-05-14, 82 y.o., female Today's Date: 12/26/2022  END OF SESSION:  PT End of Session - 12/26/22 1501     Visit Number 11    Number of Visits 14    Date for PT Re-Evaluation 01/02/23    PT Start Time 1401    PT Stop Time 1458    PT Time Calculation (min) 57 min    Activity Tolerance Patient tolerated treatment well    Behavior During Therapy [facility] for tasks assessed/performed               Past Medical History:  Diagnosis Date   Anemia 12/2015   Bronchitis, chronic (HCC)    in her 85s   Cataract    Chronic kidney disease    in 20s had nephritis   Closed fracture of right distal humerus    right   Coronary artery disease    Hand, foot and mouth disease (HFMD)    Hearing loss 05/03/2016   High cholesterol    Hypothyroidism    Myocardial infarction (HCC)    Osteopenia    Pericarditis 01/1999   Pneumothorax 01/1999   Thyroid disorder    Vitamin B 12 deficiency    Past Surgical History:  Procedure Laterality Date   birthmark      removed,left leg   BREAST BIOPSY Left 09/13/2022   [place] LT BREAST BX W LOC DEV 1ST LESION IMG BX SPEC US GUIDE 09/13/2022 [facility]   CARDIAC CATHETERIZATION     CATARACT EXTRACTION  2017   CORONARY STENT INTERVENTION N/A 09/01/2020   Procedure: CORONARY STENT INTERVENTION;  Surgeon: Lyn Records, MD;  Location: [facility];  Service: Cardiovascular;  Laterality: N/A;   LEFT HEART CATH AND CORONARY ANGIOGRAPHY N/A 09/01/2020   Procedure: LEFT HEART CATH AND CORONARY ANGIOGRAPHY;  Surgeon: Lyn Records, MD;  Location: [facility];  Service: Cardiovascular;  Laterality: N/A;   ORIF HUMERUS FRACTURE Right 04/10/2019   Procedure: RIGHT OPEN REDUCTION INTERNAL FIXATION (ORIF) DISTAL HUMERUS FRACTURE WITH EXTENSION;  Surgeon: Bjorn Pippin, MD;  Location: [facility];  Service: Orthopedics;   Laterality: Right;   PORTACATH PLACEMENT N/A 11/15/2022   Procedure: PORT PLACEMENT WITH ULTRASOUND GUIDANCE;  Surgeon: Harriette Bouillon, MD;  Location: [facility];  Service: General;  Laterality: N/A;   SIMPLE MASTECTOMY WITH AXILLARY SENTINEL NODE BIOPSY Bilateral 10/18/2022   Procedure: BILATERAL SIMPLE MASTECTOMY;  Surgeon: Harriette Bouillon, MD;  Location: [facility];  Service: General;  Laterality: Bilateral;  PEC BLOCK   TONSILLECTOMY AND ADENOIDECTOMY  1952?   Patient Active Problem List   Diagnosis Date Noted   Malignant neoplasm of upper-outer quadrant of left breast in female, estrogen receptor negative (HCC) 09/21/2022   Atherosclerotic heart disease of native coronary artery with other forms of angina pectoris (HCC) 04/08/2022   Swelling of left foot 04/07/2022   Left hand fracture, sequela 04/07/2022   Depression with anxiety 04/07/2022   NSTEMI (non-ST elevated myocardial infarction) (HCC) 09/01/2020   Closed fracture of right distal humerus 04/10/2019   Dyspnea 05/09/2018   Vitamin B 12 deficiency 05/09/2018   Cough 03/06/2018   Senile osteoporosis 11/20/2017   Iron deficiency 04/26/2017   Lichen sclerosus 04/26/2017   External hemorrhoid 04/26/2017   Weakness of left leg 06/07/2016   Balance disorder 05/24/2016   Memory deficit 05/24/2016   Anemia 05/19/2016   Hearing loss  05/03/2016   Hypothyroidism 07/24/2013   Bradycardia on ECG 07/24/2013   Numbness 08/07/2012   HYPERLIPIDEMIA 12/29/2006    REFERRING PROVIDER: Dr. Harriette Bouillon  REFERRING DIAG: Left breast cancer  THERAPY DIAG:  Difficulty in walking, not elsewhere classified  Muscle weakness (generalized)  Stiffness of left shoulder, not elsewhere classified  Stiffness of right shoulder, not elsewhere classified  Aftercare following surgery for neoplasm  Abnormal posture  Malignant neoplasm of upper-outer quadrant of left breast in female, estrogen receptor negative (HCC)  Rationale for  Evaluation and Treatment: Rehabilitation  ONSET DATE: 10/18/2022  SUBJECTIVE:                                                                                                                                                                                           SUBJECTIVE STATEMENT: I have just been feeling very tired. I saw the surgeon and he said that I don't have to wear my compression bra. He said everything was just skin and flesh. The SI joint is feeling much better.   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 She underwent a bilateral mastectomy and left sentinel node biopsy (7 negative nodes) on 10/18/2022. It is triple negative with a Ki67 of 5%. She has some cardiac history and had an elbow fracture in 2022 resulting in surgery where pins were placed.   PATIENT GOALS:  Reassess how my recovery is going related to arm function, pain, and swelling.  PAIN:  Are you having pain? No   PRECAUTIONS: Recent Surgery, left UE Lymphedema risk  RED FLAGS: None   ACTIVITY LEVEL / LEISURE: She is walking half to 1 mile daily   OBJECTIVE:   PATIENT SURVEYS:  QUICK DASH:    OBSERVATIONS: Bilateral incisions appear to be healing well. There is skin overlap and edema present with tissue on the lateral aspects on her chest.  POSTURE:  Forward head and rounded shoulders   LYMPHEDEMA ASSESSMENT:   UPPER EXTREMITY AROM/PROM:   A/PROM RIGHT   eval   RIGHT 11/14/2022  Shoulder extension 63 60  Shoulder flexion 128 110  Shoulder abduction 137 136  Shoulder internal rotation 68 75  Shoulder external rotation 73 75                          (Blank rows = not tested)   A/PROM LEFT   eval LEFT 11/14/2022 LET  12/05/22  Shoulder extension 53 49   Shoulder flexion 135 109 154  Shoulder abduction 146 114 161  Shoulder internal rotation 74 77  Shoulder external rotation 78 70                           (Blank  rows = not tested)     UPPER EXTREMITY STRENGTH: WFL  LOWER EXTREMITY STRENGTH:   MMT Right eval  Hip flexion 5/5  Hip extension 4/5  Hip abduction 4/5  Hip adduction   Hip internal rotation   Hip external rotation   Knee flexion 5/5  Knee extension 5/5  Ankle dorsiflexion 5/5  Ankle plantarflexion   Ankle inversion   Ankle eversion    (Blank rows = not tested)  A/PROM LEFT eval  Hip flexion 5/5  Hip extension 3/5  Hip abduction 4+/5  Hip adduction   Hip internal rotation   Hip external rotation   Knee flexion 5/5  Knee extension 5/5  Ankle dorsiflexion 5/5  Ankle plantarflexion   Ankle inversion   Ankle eversion    (Blank rows = not tested)  FUNCTIONAL TEST:  30 SEC SIT TO STAND: 9 reps which is below average for her age  BERG Balance Test: 44/56, anything less than 45 is a greater risk of falling  LYMPHEDEMA ASSESSMENTS: NOT ASSESSED AS PT IS NOT HAVING ANY LYMPH NODES REMOVED   LANDMARK RIGHT   eval RIGHT 11/14/2022  10 cm proximal to olecranon process  Not entered due pt was not originally having nodes removed 27.6  Olecranon process   24.3  10 cm proximal to ulnar styloid process   20.1  Just proximal to ulnar styloid process   15.1  Across hand at thumb web space   16.7  At base of 2nd digit   6.1  (Blank rows = not tested)   LANDMARK LEFT   eval LEFT 11/14/2022  10 cm proximal to olecranon process   Not entered due pt was not originally having nodes removed 26.4  Olecranon process   23.4  10 cm proximal to ulnar styloid process   19.5  Just proximal to ulnar styloid process   13.9  Across hand at thumb web space   16.4  At base of 2nd digit   6.1  (Blank rows = not tested) Surgery type/Date: 10/18/2022 Number of lymph nodes removed: 7 Current/past treatment (chemo, radiation, hormone therapy): none Other symptoms:  Heaviness/tightness No Pain Yes Pitting edema No Infections No Decreased scar mobility Yes Stemmer sign No   TODAY'S  TREATMENT: Date: 12/26/22 Nustep level 4 x 11 min - seat at 8 no UEs with v/c to keep knees from adducting, 546 steps Leg press x 80 lbs x 20 reps 3 way hip machine x 15 reps each 45# with pt returning therapist demo Supine bridging x 10 reps with v/c to keep knees hip width apart  Pelvic tilts x 10 reps with v/c for correct form Pelvic tilts and mini marches x 10 reps each with v/c to move slowly and controlled Pelvic tilts and mini hip abd in hooklying x 10 reps with v/c to keep core engaged to avoid movement at lower trunk Measured pt for a compression sleeve for prophylactic use and assisted her with ordering from compression guru - [employer] S1 beige 20-86mmHg Stepping over hurdles in // bars x 10 reps with pt initially able to clear hurdles and when she became distracted she did not clear them - demo to pt how to step over them intentionally and pt was able to return demo without touching the hurdles; then repeated  going side to side x 6, then backwards x 1    12/21/22 Sidelying on L side: STM to R lower back/SI joint area with increased muscle tightness palpable, then use suction cup with cocoa butter moivng in all directions while also pulling upwards to improve blood flow and decrease tightness Supine bridging x 20 reps with v/c to keep knees hip width apart - pt reported decreased discomfort after this Supine in hook lying with folded pillow between knees and gait belt around thighs alternating between isometric hip abduction and adduction with 5 sec holds x 5 reps each  Nustep level 2 x 10 min - seat at 8 no UEs with v/c to keep knees from adducting, 512 steps  12/19/22 Nustep level 3 x 10 min - seat at 8 no UEs with v/c to keep knees from adducting, 540 steps 3 way hip machine:  45 lbs x 10 reps bilaterally in to flexion, abduction, and extension with pt feeling challenged by this Leg press x 70 lbs x 20 reps with seat at 5 Supine knee to chest stretch x 15 sec hold x  3 Supine trunk rotation stretch x 30 sec holds to stretch R side and decrease back pain Seated edge of mat: hamstring stretch x 30 sec holds x 2 reps bilaterally Piriformis stretch x 30 sec holds bilaterally x 2 Stepping over hurdles x 8 with occasional inability to clear hurdles- pt felt challenged by this especially while talking and walking Forward step ups x 10 reps bilaterally with pt with occasional loss of balance and need for CGA Lateral steps ups x 10 reps bilaterally with occasional loss of balance that required min assist to correct and CGA- minA to Assessed bilateral lateral trunk lymphedema and her L is still considerably swollen- assessed compression bra and it was ill fitting so suggested wearing the [employer] bra instead - educated pt to discuss swelling with her surgeon at her appt on 10/21  12/13/22 Nustep level 5 x 10 min - seat at 8 no UEs with v/c to keep knees from adducting, 540 steps 3 way hip machine:  40 lbs x 10 reps bilaterally in to flexion, abduction, and extension with pt feeling challenged by this Leg press x 70 lbs x 20 reps with seat at 5 Heel/toe walking on air ex beam x 6 initially with 1 HHA then down to finger tips then to occasional finger touch on bars for support Sidestepping down airex balance beam x 4 reps with occasional finger tip touch on bars  Forward step ups x 10 reps bilaterally with pt feeling challenged by this with occasional finger touch on // bars Lateral steps ups x 10 reps bilaterally with occasional finger touch on bars with increased challenge with this Backwards step ups x 10 with 1 HHA on // bars with increased difficulty and need for assist to get her foot on step  12/07/22 Nustep level 5 x 10 min - seat at 8 no UEs with v/c to keep knees from adducting, 490 steps 3 way hip machine: initially started with 10 reps of extension at 25 lbs but this was too easy so increased resistance to 40 lbs x 10 reps bilaterally in to flexion,  abduction, and extension with pt feeling challenged by this IT band rolling bilaterally x 5 min Seated hamstring stretch x 30 sec holds x 2 reps bilaterally Quad stretch in // bars x 60 sec holds bilaterally Heel/toe walking on air ex beam x 4 initially with  2 HHA then down to finger tips Standing on 1 leg on air ex with finger tip assist on // bars with pt able to maintain about 1-3 sec holds Staining on air - 3 way hip with no resistance x 10 reps each with fingertip to HHA to challenge balance   12/05/22: Pulleys x 2 min in direction of flexion and 2 min in direction of abduction Discussed importance of doing supine scapular strengtheing exercises in supine for now due to compensation patterns in standing Educated pt in lymphedema risk reduction practices and issued handouts  11/30/22: Therapeutic Exercises Pulleys into flexion x 2 mins and then in to abduction x 2 min Ball flexion x 10 on wall with v/c for proper form, abduction x 10 bilaterally with v/c for proper form Instructed pt in supine scapular strengthening exercises with yellow band x 10 reps each with pt returning therapist demo as follows: narrow and wide grip flexion, ER, horizontal abduction, and diagonals - pt did not have any increased pain with these - issued these as part of her HEP Manual Therapy P/ROM to bil shoulders into flexion, abd and ER to pts tolerance with therapist able to achieve full PROM MFR to Lt UE at area of cording from axilla to medial upper arm with numerous cords palpable MLD: Rt inguinal nodes, Rt axillo-inguinal anastomosis and then focused on edema inferior to Rt mastectomy   11/28/22: Therapeutic Exercises Pulleys into flexion x 2 mins and then in to abduction x 2 min Ball flexion x 10 on wall with v/c for proper form, abduction x 10 bilaterally with v/c for proper form Manual Therapy P/ROM to bil shoulders into flexion, abd and ER to pts tolerance with therapist able to achieve full PROM MFR  to Lt UE at area of cording from axilla to medial upper arm with numerous cords palpable MLD: Rt inguinal nodes, Rt axillo-inguinal anastomosis and then focused on edema inferior to Rt mastectomy instructing pt in this and having her return demo then repeated on L side which appears more swollen today Edema management: cut 1/2 grey foam and placed in small stockinette to wear across bilateral chest to decrease post op edema  11/21/22: Manual Therapy P/ROM to bil shoulders into flexion, abd and D2 to pts tolerance and with scapular depression by therapist throughout MFR to Lt UE at area of cording from axilla to medial upper arm MLD: Rt inguinal nodes, Rt axillo-inguinal anastomosis and then focused on edema inferior to Rt mastectomy instructing pt in this and having her return demo Therapeutic Exercises Pulleys into flex x 2:30 mins with VC's during to decrease scapular compensation  PATIENT EDUCATION:  Education details: HEP Person educated: Patient Education method: Medical illustrator Education comprehension: verbalized understanding and returned demonstration  HOME EXERCISE PROGRAM: Reviewed previously given post op HEP. SLS and tandem stance at counter  Bridging SI joint stabilization in to isometric abduction/adduction  ASSESSMENT:  CLINICAL IMPRESSION: Continued with LE strengthening exercises today and increased resistance on leg press today. Her SI joint pain has improved since last session and she has been continuing the SI stabilization exercises. Continued with focus on hurdles since pt has difficulty clearing these and difficulty with SLS.   Pt will benefit from skilled therapeutic intervention to improve on the following deficits: Decreased knowledge of precautions, impaired UE functional use, pain, decreased ROM, postural dysfunction.   PT treatment/interventions: ADL/Self care home management, Therapeutic exercises, Therapeutic activity, Patient/Family  education, Self Care, Manual lymph drainage, scar mobilization, Manual  therapy, and Re-evaluation   GOALS: Goals reviewed with patient? Yes  LONG TERM GOALS:  (STG=LTG)  GOALS Name Target Date  Goal status  1 Pt will demonstrate she has regained full shoulder ROM and function post operatively compared to baselines.  Baseline: 12/12/2022 MET  12/05/22  2 Patient will report >/= 50% less pain and sensitivity in her chest midline to tolerate daily tasks with greater ease 12/12/2022 MET 12/05/22 at least 50% improved  3 Patient will improve her DASH score to be back to baseline (4.55) for improved overall UE function. 12/12/2022 INITIAL  4 Patient will report good understanding of lymphedema risk reduction practices. 12/12/2022 ONGOING 12/05/22- re eductated pt and issued handouts  5 Pt will receive a compression sleeve to wear when she flies for prophylactic use. 01/02/23 NEW  6 Pt will be able to complete 12 sit to stands in 30 sec wihtout UEs to reduce fall risk. 01/02/23 NEW - current is 9  7 Pt will improve BERG balance score to 50 to decrease risk of falls. 01/02/23 NEW - current is 44     PLAN:  PT FREQUENCY/DURATION: 2x/week for 4 weeks  PLAN FOR NEXT SESSION: continue hurdles and add foam pads as well as balance exercises, measure for compression sleeve, begin high level balance and update HEP,  begin scar massage when fully healed (2-3 more weeks ~9/30), MLD as needed   [facility]  [address]  [place] [address]  [phone] [place], PT 12/26/2022, 3:02 PM

## 2022-12-28 ENCOUNTER — Encounter: Payer: Medicare Other | Admitting: Physical Therapy

## 2022-12-29 NOTE — Plan of Care (Signed)
[facility] Tonsillectomy/Adenoidectomy, Postoperative PEDS care plan entered in error.

## 2023-01-01 ENCOUNTER — Other Ambulatory Visit: Payer: Self-pay | Admitting: Cardiovascular Disease

## 2023-01-02 ENCOUNTER — Ambulatory Visit: Payer: Medicare Other | Admitting: Physical Therapy

## 2023-01-02 ENCOUNTER — Encounter: Payer: Self-pay | Admitting: Physical Therapy

## 2023-01-02 DIAGNOSIS — Z483 Aftercare following surgery for neoplasm: Secondary | ICD-10-CM | POA: Diagnosis not present

## 2023-01-02 DIAGNOSIS — M6281 Muscle weakness (generalized): Secondary | ICD-10-CM

## 2023-01-02 DIAGNOSIS — C50412 Malignant neoplasm of upper-outer quadrant of left female breast: Secondary | ICD-10-CM

## 2023-01-02 DIAGNOSIS — M25612 Stiffness of left shoulder, not elsewhere classified: Secondary | ICD-10-CM

## 2023-01-02 DIAGNOSIS — R262 Difficulty in walking, not elsewhere classified: Secondary | ICD-10-CM | POA: Diagnosis not present

## 2023-01-02 DIAGNOSIS — R293 Abnormal posture: Secondary | ICD-10-CM | POA: Diagnosis not present

## 2023-01-02 DIAGNOSIS — M25611 Stiffness of right shoulder, not elsewhere classified: Secondary | ICD-10-CM

## 2023-01-02 NOTE — Therapy (Signed)
OUTPATIENT PHYSICAL THERAPY BREAST CANCER TREATMENT   Patient Name: Jessica [name] MRN: [id number] DOB:Apr 10, 1940, 82 y.o., female Today's Date: 01/02/2023  END OF SESSION:  PT End of Session - 01/02/23 1403     Visit Number 12    Number of Visits 14    Date for PT Re-Evaluation 01/02/23    PT Start Time 1401    PT Stop Time 1455    PT Time Calculation (min) 54 min    Activity Tolerance Patient tolerated treatment well    Behavior During Therapy [facility] for tasks assessed/performed               Past Medical History:  Diagnosis Date   Anemia 12/2015   Bronchitis, chronic (HCC)    in her 72s   Cataract    Chronic kidney disease    in 20s had nephritis   Closed fracture of right distal humerus    right   Coronary artery disease    Hand, foot and mouth disease (HFMD)    Hearing loss 05/03/2016   High cholesterol    Hypothyroidism    Myocardial infarction (HCC)    Osteopenia    Pericarditis 01/1999   Pneumothorax 01/1999   Thyroid disorder    Vitamin B 12 deficiency    Past Surgical History:  Procedure Laterality Date   birthmark      removed,left leg   BREAST BIOPSY Left 09/13/2022   [place] LT BREAST BX W LOC DEV 1ST LESION IMG BX SPEC US GUIDE 09/13/2022 [facility]   CARDIAC CATHETERIZATION     CATARACT EXTRACTION  2017   CORONARY STENT INTERVENTION N/A 09/01/2020   Procedure: CORONARY STENT INTERVENTION;  Surgeon: Lyn Records, MD;  Location: [facility];  Service: Cardiovascular;  Laterality: N/A;   LEFT HEART CATH AND CORONARY ANGIOGRAPHY N/A 09/01/2020   Procedure: LEFT HEART CATH AND CORONARY ANGIOGRAPHY;  Surgeon: Lyn Records, MD;  Location: [facility];  Service: Cardiovascular;  Laterality: N/A;   ORIF HUMERUS FRACTURE Right 04/10/2019   Procedure: RIGHT OPEN REDUCTION INTERNAL FIXATION (ORIF) DISTAL HUMERUS FRACTURE WITH EXTENSION;  Surgeon: Bjorn Pippin, MD;  Location: [facility];  Service: Orthopedics;   Laterality: Right;   PORTACATH PLACEMENT N/A 11/15/2022   Procedure: PORT PLACEMENT WITH ULTRASOUND GUIDANCE;  Surgeon: Harriette Bouillon, MD;  Location: [facility];  Service: General;  Laterality: N/A;   SIMPLE MASTECTOMY WITH AXILLARY SENTINEL NODE BIOPSY Bilateral 10/18/2022   Procedure: BILATERAL SIMPLE MASTECTOMY;  Surgeon: Harriette Bouillon, MD;  Location: [facility];  Service: General;  Laterality: Bilateral;  PEC BLOCK   TONSILLECTOMY AND ADENOIDECTOMY  1952?   Patient Active Problem List   Diagnosis Date Noted   Malignant neoplasm of upper-outer quadrant of left breast in female, estrogen receptor negative (HCC) 09/21/2022   Atherosclerotic heart disease of native coronary artery with other forms of angina pectoris (HCC) 04/08/2022   Swelling of left foot 04/07/2022   Left hand fracture, sequela 04/07/2022   Depression with anxiety 04/07/2022   NSTEMI (non-ST elevated myocardial infarction) (HCC) 09/01/2020   Closed fracture of right distal humerus 04/10/2019   Dyspnea 05/09/2018   Vitamin B 12 deficiency 05/09/2018   Cough 03/06/2018   Senile osteoporosis 11/20/2017   Iron deficiency 04/26/2017   Lichen sclerosus 04/26/2017   External hemorrhoid 04/26/2017   Weakness of left leg 06/07/2016   Balance disorder 05/24/2016   Memory deficit 05/24/2016   Anemia 05/19/2016   Hearing loss  05/03/2016   Hypothyroidism 07/24/2013   Bradycardia on ECG 07/24/2013   Numbness 08/07/2012   HYPERLIPIDEMIA 12/29/2006    REFERRING PROVIDER: Dr. Harriette Bouillon  REFERRING DIAG: Left breast cancer  THERAPY DIAG:  Difficulty in walking, not elsewhere classified  Muscle weakness (generalized)  Stiffness of left shoulder, not elsewhere classified  Stiffness of right shoulder, not elsewhere classified  Aftercare following surgery for neoplasm  Abnormal posture  Malignant neoplasm of upper-outer quadrant of left breast in female, estrogen receptor negative (HCC)  Rationale for  Evaluation and Treatment: Rehabilitation  ONSET DATE: 10/18/2022  SUBJECTIVE:                                                                                                                                                                                           SUBJECTIVE STATEMENT: I have still been tired. I have been trying to do my exercises.   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 She underwent a bilateral mastectomy and left sentinel node biopsy (7 negative nodes) on 10/18/2022. It is triple negative with a Ki67 of 5%. She has some cardiac history and had an elbow fracture in 2022 resulting in surgery where pins were placed.   PATIENT GOALS:  Reassess how my recovery is going related to arm function, pain, and swelling.  PAIN:  Are you having pain? No   PRECAUTIONS: Recent Surgery, left UE Lymphedema risk  RED FLAGS: None   ACTIVITY LEVEL / LEISURE: She is walking half to 1 mile daily   OBJECTIVE:   PATIENT SURVEYS:  QUICK DASH:    OBSERVATIONS: Bilateral incisions appear to be healing well. There is skin overlap and edema present with tissue on the lateral aspects on her chest.  POSTURE:  Forward head and rounded shoulders   LYMPHEDEMA ASSESSMENT:   UPPER EXTREMITY AROM/PROM:   A/PROM RIGHT   eval   RIGHT 11/14/2022  Shoulder extension 63 60  Shoulder flexion 128 110  Shoulder abduction 137 136  Shoulder internal rotation 68 75  Shoulder external rotation 73 75                          (Blank rows = not tested)   A/PROM LEFT   eval LEFT 11/14/2022 LET  12/05/22  Shoulder extension 53 49   Shoulder flexion 135 109 154  Shoulder abduction 146 114 161  Shoulder internal rotation 74 77   Shoulder external rotation 78 70                           (  Blank rows = not tested)     UPPER EXTREMITY STRENGTH: WFL  LOWER EXTREMITY STRENGTH:   MMT Right eval  Hip flexion 5/5  Hip  extension 4/5  Hip abduction 4/5  Hip adduction   Hip internal rotation   Hip external rotation   Knee flexion 5/5  Knee extension 5/5  Ankle dorsiflexion 5/5  Ankle plantarflexion   Ankle inversion   Ankle eversion    (Blank rows = not tested)  A/PROM LEFT eval  Hip flexion 5/5  Hip extension 3/5  Hip abduction 4+/5  Hip adduction   Hip internal rotation   Hip external rotation   Knee flexion 5/5  Knee extension 5/5  Ankle dorsiflexion 5/5  Ankle plantarflexion   Ankle inversion   Ankle eversion    (Blank rows = not tested)  FUNCTIONAL TEST:  30 SEC SIT TO STAND: 9 reps which is below average for her age  BERG Balance Test: 44/56, anything less than 45 is a greater risk of falling  LYMPHEDEMA ASSESSMENTS: NOT ASSESSED AS PT IS NOT HAVING ANY LYMPH NODES REMOVED   LANDMARK RIGHT   eval RIGHT 11/14/2022  10 cm proximal to olecranon process  Not entered due pt was not originally having nodes removed 27.6  Olecranon process   24.3  10 cm proximal to ulnar styloid process   20.1  Just proximal to ulnar styloid process   15.1  Across hand at thumb web space   16.7  At base of 2nd digit   6.1  (Blank rows = not tested)   LANDMARK LEFT   eval LEFT 11/14/2022  10 cm proximal to olecranon process   Not entered due pt was not originally having nodes removed 26.4  Olecranon process   23.4  10 cm proximal to ulnar styloid process   19.5  Just proximal to ulnar styloid process   13.9  Across hand at thumb web space   16.4  At base of 2nd digit   6.1  (Blank rows = not tested) Surgery type/Date: 10/18/2022 Number of lymph nodes removed: 7 Current/past treatment (chemo, radiation, hormone therapy): none Other symptoms:  Heaviness/tightness No Pain Yes Pitting edema No Infections No Decreased scar mobility Yes Stemmer sign No   TODAY'S TREATMENT: Date: 01/02/23 Nustep level 3 x 10 min - seat at 8 no UEs with v/c to keep knees from adducting, 579 steps Leg press x 80  lbs x 20 reps 3 way hip machine x 20 reps each 45# with pt returning therapist demo Supine bridging x 10 reps with v/c to keep knees hip width apart  Pelvic tilts x 10 reps with v/c for correct form Pelvic tilts and mini marches x 10 reps each with v/c to move slowly and controlled Pelvic tilts and mini hip abd in hooklying x 10 reps with v/c to keep core engaged to avoid movement at lower trunk Stepping over hurdles in // bars with 1 cobblestone square in between 2 of the hurdles and an airex pad in between 2 of the hurdles with occasional min A and CGA throughout x 6 reps going forward, 4 reps going side to side; then removed the air ex and cobble stone and pt did much better and added x 2 backwards for this - pt found cobblestone and airex challenging and she lost balance 2x and required min A to correct  12/26/22 Nustep level 4 x 11 min - seat at 8 no UEs with v/c to keep knees from adducting,  546 steps Leg press x 80 lbs x 20 reps 3 way hip machine x 15 reps each 45# with pt returning therapist demo Supine bridging x 10 reps with v/c to keep knees hip width apart  Pelvic tilts x 10 reps with v/c for correct form Pelvic tilts and mini marches x 10 reps each with v/c to move slowly and controlled Pelvic tilts and mini hip abd in hooklying x 10 reps with v/c to keep core engaged to avoid movement at lower trunk Measured pt for a compression sleeve for prophylactic use and assisted her with ordering from compression guru - [employer] S1 beige 20-51mmHg Stepping over hurdles in // bars x 10 reps with pt initially able to clear hurdles and when she became distracted she did not clear them - demo to pt how to step over them intentionally and pt was able to return demo without touching the hurdles; then repeated going side to side x 6, then backwards x 1    12/21/22 Sidelying on L side: STM to R lower back/SI joint area with increased muscle tightness palpable, then use suction cup with cocoa  butter moivng in all directions while also pulling upwards to improve blood flow and decrease tightness Supine bridging x 20 reps with v/c to keep knees hip width apart - pt reported decreased discomfort after this Supine in hook lying with folded pillow between knees and gait belt around thighs alternating between isometric hip abduction and adduction with 5 sec holds x 5 reps each  Nustep level 2 x 10 min - seat at 8 no UEs with v/c to keep knees from adducting, 512 steps  12/19/22 Nustep level 3 x 10 min - seat at 8 no UEs with v/c to keep knees from adducting, 540 steps 3 way hip machine:  45 lbs x 10 reps bilaterally in to flexion, abduction, and extension with pt feeling challenged by this Leg press x 70 lbs x 20 reps with seat at 5 Supine knee to chest stretch x 15 sec hold x 3 Supine trunk rotation stretch x 30 sec holds to stretch R side and decrease back pain Seated edge of mat: hamstring stretch x 30 sec holds x 2 reps bilaterally Piriformis stretch x 30 sec holds bilaterally x 2 Stepping over hurdles x 8 with occasional inability to clear hurdles- pt felt challenged by this especially while talking and walking Forward step ups x 10 reps bilaterally with pt with occasional loss of balance and need for CGA Lateral steps ups x 10 reps bilaterally with occasional loss of balance that required min assist to correct and CGA- minA to Assessed bilateral lateral trunk lymphedema and her L is still considerably swollen- assessed compression bra and it was ill fitting so suggested wearing the [employer] bra instead - educated pt to discuss swelling with her surgeon at her appt on 10/21  12/13/22 Nustep level 5 x 10 min - seat at 8 no UEs with v/c to keep knees from adducting, 540 steps 3 way hip machine:  40 lbs x 10 reps bilaterally in to flexion, abduction, and extension with pt feeling challenged by this Leg press x 70 lbs x 20 reps with seat at 5 Heel/toe walking on air ex beam x 6  initially with 1 HHA then down to finger tips then to occasional finger touch on bars for support Sidestepping down airex balance beam x 4 reps with occasional finger tip touch on bars  Forward step ups x 10  reps bilaterally with pt feeling challenged by this with occasional finger touch on // bars Lateral steps ups x 10 reps bilaterally with occasional finger touch on bars with increased challenge with this Backwards step ups x 10 with 1 HHA on // bars with increased difficulty and need for assist to get her foot on step  12/07/22 Nustep level 5 x 10 min - seat at 8 no UEs with v/c to keep knees from adducting, 490 steps 3 way hip machine: initially started with 10 reps of extension at 25 lbs but this was too easy so increased resistance to 40 lbs x 10 reps bilaterally in to flexion, abduction, and extension with pt feeling challenged by this IT band rolling bilaterally x 5 min Seated hamstring stretch x 30 sec holds x 2 reps bilaterally Quad stretch in // bars x 60 sec holds bilaterally Heel/toe walking on air ex beam x 4 initially with 2 HHA then down to finger tips Standing on 1 leg on air ex with finger tip assist on // bars with pt able to maintain about 1-3 sec holds Staining on air - 3 way hip with no resistance x 10 reps each with fingertip to HHA to challenge balance   12/05/22: Pulleys x 2 min in direction of flexion and 2 min in direction of abduction Discussed importance of doing supine scapular strengtheing exercises in supine for now due to compensation patterns in standing Educated pt in lymphedema risk reduction practices and issued handouts  11/30/22: Therapeutic Exercises Pulleys into flexion x 2 mins and then in to abduction x 2 min Ball flexion x 10 on wall with v/c for proper form, abduction x 10 bilaterally with v/c for proper form Instructed pt in supine scapular strengthening exercises with yellow band x 10 reps each with pt returning therapist demo as follows: narrow  and wide grip flexion, ER, horizontal abduction, and diagonals - pt did not have any increased pain with these - issued these as part of her HEP Manual Therapy P/ROM to bil shoulders into flexion, abd and ER to pts tolerance with therapist able to achieve full PROM MFR to Lt UE at area of cording from axilla to medial upper arm with numerous cords palpable MLD: Rt inguinal nodes, Rt axillo-inguinal anastomosis and then focused on edema inferior to Rt mastectomy   11/28/22: Therapeutic Exercises Pulleys into flexion x 2 mins and then in to abduction x 2 min Ball flexion x 10 on wall with v/c for proper form, abduction x 10 bilaterally with v/c for proper form Manual Therapy P/ROM to bil shoulders into flexion, abd and ER to pts tolerance with therapist able to achieve full PROM MFR to Lt UE at area of cording from axilla to medial upper arm with numerous cords palpable MLD: Rt inguinal nodes, Rt axillo-inguinal anastomosis and then focused on edema inferior to Rt mastectomy instructing pt in this and having her return demo then repeated on L side which appears more swollen today Edema management: cut 1/2 grey foam and placed in small stockinette to wear across bilateral chest to decrease post op edema  11/21/22: Manual Therapy P/ROM to bil shoulders into flexion, abd and D2 to pts tolerance and with scapular depression by therapist throughout MFR to Lt UE at area of cording from axilla to medial upper arm MLD: Rt inguinal nodes, Rt axillo-inguinal anastomosis and then focused on edema inferior to Rt mastectomy instructing pt in this and having her return demo Therapeutic Exercises Pulleys into flex  x 2:30 mins with VC's during to decrease scapular compensation  PATIENT EDUCATION:  Education details: HEP Person educated: Patient Education method: Medical illustrator Education comprehension: verbalized understanding and returned demonstration  HOME EXERCISE PROGRAM: Reviewed  previously given post op HEP. SLS and tandem stance at counter  Bridging SI joint stabilization in to isometric abduction/adduction  ASSESSMENT:  CLINICAL IMPRESSION: Continued to work on imporving balance and LE strength. Increased reps on 3 way hip machine today. Continuing to provide v/c for correct form. Also instructed pt in correct way to progress UE exercises to decrease risk of lymphedema. Continued to progress high level balance exercises to reduce fall risk. Pt is now able to stand in SLS for a couple of seconds but is still very challenged with hurdles and soft surfaces.   Pt will benefit from skilled therapeutic intervention to improve on the following deficits: Decreased knowledge of precautions, impaired UE functional use, pain, decreased ROM, postural dysfunction.   PT treatment/interventions: ADL/Self care home management, Therapeutic exercises, Therapeutic activity, Patient/Family education, Self Care, Manual lymph drainage, scar mobilization, Manual therapy, and Re-evaluation   GOALS: Goals reviewed with patient? Yes  LONG TERM GOALS:  (STG=LTG)  GOALS Name Target Date  Goal status  1 Pt will demonstrate she has regained full shoulder ROM and function post operatively compared to baselines.  Baseline: 12/12/2022 MET  12/05/22  2 Patient will report >/= 50% less pain and sensitivity in her chest midline to tolerate daily tasks with greater ease 12/12/2022 MET 12/05/22 at least 50% improved  3 Patient will improve her DASH score to be back to baseline (4.55) for improved overall UE function. 12/12/2022 INITIAL  4 Patient will report good understanding of lymphedema risk reduction practices. 12/12/2022 ONGOING 12/05/22- re eductated pt and issued handouts  5 Pt will receive a compression sleeve to wear when she flies for prophylactic use. 01/02/23 NEW  6 Pt will be able to complete 12 sit to stands in 30 sec wihtout UEs to reduce fall risk. 01/02/23 NEW - current is 9  7 Pt will  improve BERG balance score to 50 to decrease risk of falls. 01/02/23 NEW - current is 44     PLAN:  PT FREQUENCY/DURATION: 2x/week for 4 weeks  PLAN FOR NEXT SESSION: UPDATE POC, continue hurdles and add foam pads as well as balance exercises, measure for compression sleeve, begin high level balance and update HEP,  begin scar massage when fully healed (2-3 more weeks ~9/30), MLD as needed   [facility]  [address], [address]  [place] [address]  [phone] [place], PT 01/02/2023, 3:02 PM

## 2023-01-04 ENCOUNTER — Inpatient Hospital Stay (HOSPITAL_BASED_OUTPATIENT_CLINIC_OR_DEPARTMENT_OTHER): Payer: Medicare Other | Admitting: Hematology and Oncology

## 2023-01-04 ENCOUNTER — Other Ambulatory Visit: Payer: Medicare Other

## 2023-01-04 ENCOUNTER — Inpatient Hospital Stay: Payer: Medicare Other

## 2023-01-04 ENCOUNTER — Ambulatory Visit: Payer: Medicare Other | Admitting: Hematology and Oncology

## 2023-01-04 ENCOUNTER — Encounter: Payer: Self-pay | Admitting: *Deleted

## 2023-01-04 ENCOUNTER — Ambulatory Visit: Payer: Medicare Other

## 2023-01-04 VITALS — BP 139/84 | HR 87 | Temp 97.8°F | Resp 18 | Ht 63.0 in | Wt 153.9 lb

## 2023-01-04 DIAGNOSIS — Z171 Estrogen receptor negative status [ER-]: Secondary | ICD-10-CM | POA: Diagnosis not present

## 2023-01-04 DIAGNOSIS — C50412 Malignant neoplasm of upper-outer quadrant of left female breast: Secondary | ICD-10-CM | POA: Diagnosis not present

## 2023-01-04 DIAGNOSIS — Z9012 Acquired absence of left breast and nipple: Secondary | ICD-10-CM | POA: Diagnosis not present

## 2023-01-04 DIAGNOSIS — Z5111 Encounter for antineoplastic chemotherapy: Secondary | ICD-10-CM | POA: Diagnosis not present

## 2023-01-04 DIAGNOSIS — R5383 Other fatigue: Secondary | ICD-10-CM | POA: Diagnosis not present

## 2023-01-04 LAB — CBC WITH DIFFERENTIAL (CANCER CENTER ONLY)
Abs Immature Granulocytes: 0.06 10*3/uL (ref 0.00–0.07)
Basophils Absolute: 0 10*3/uL (ref 0.0–0.1)
Basophils Relative: 1 %
Eosinophils Absolute: 0.2 10*3/uL (ref 0.0–0.5)
Eosinophils Relative: 4 %
HCT: 36.1 % (ref 36.0–46.0)
Hemoglobin: 12.5 g/dL (ref 12.0–15.0)
Immature Granulocytes: 1 %
Lymphocytes Relative: 35 %
Lymphs Abs: 1.6 10*3/uL (ref 0.7–4.0)
MCH: 33.4 pg (ref 26.0–34.0)
MCHC: 34.6 g/dL (ref 30.0–36.0)
MCV: 96.5 fL (ref 80.0–100.0)
Monocytes Absolute: 0.7 10*3/uL (ref 0.1–1.0)
Monocytes Relative: 15 %
Neutro Abs: 2.1 10*3/uL (ref 1.7–7.7)
Neutrophils Relative %: 44 %
Platelet Count: 236 10*3/uL (ref 150–400)
RBC: 3.74 MIL/uL — ABNORMAL LOW (ref 3.87–5.11)
RDW: 13.1 % (ref 11.5–15.5)
WBC Count: 4.7 10*3/uL (ref 4.0–10.5)
nRBC: 0 % (ref 0.0–0.2)

## 2023-01-04 LAB — CMP (CANCER CENTER ONLY)
ALT: 13 U/L (ref 0–44)
AST: 14 U/L — ABNORMAL LOW (ref 15–41)
Albumin: 4 g/dL (ref 3.5–5.0)
Alkaline Phosphatase: 50 U/L (ref 38–126)
Anion gap: 5 (ref 5–15)
BUN: 12 mg/dL (ref 8–23)
CO2: 26 mmol/L (ref 22–32)
Calcium: 8.6 mg/dL — ABNORMAL LOW (ref 8.9–10.3)
Chloride: 108 mmol/L (ref 98–111)
Creatinine: 0.53 mg/dL (ref 0.44–1.00)
GFR, Estimated: >60 mL/min (ref >60)
Glucose, Bld: 93 mg/dL (ref 70–99)
Potassium: 4 mmol/L (ref 3.5–5.1)
Sodium: 139 mmol/L (ref 135–145)
Total Bilirubin: 0.5 mg/dL (ref 0.3–1.2)
Total Protein: 6.2 g/dL — ABNORMAL LOW (ref 6.5–8.1)

## 2023-01-04 MED ORDER — DEXAMETHASONE SODIUM PHOSPHATE 10 MG/ML IJ SOLN
10.0000 mg | Freq: Once | INTRAMUSCULAR | Status: AC
  Administered 2023-01-04: 10 mg via INTRAVENOUS
  Filled 2023-01-04: qty 1

## 2023-01-04 MED ORDER — SODIUM CHLORIDE 0.9 % IV SOLN: Freq: Once | INTRAVENOUS | Status: AC

## 2023-01-04 MED ORDER — METHOTREXATE SODIUM CHEMO INJECTION (PF) 50 MG/2ML
40.0000 mg/m2 | Freq: Once | INTRAMUSCULAR | Status: AC
  Administered 2023-01-04: 69.5 mg via INTRAVENOUS
  Filled 2023-01-04: qty 2.78

## 2023-01-04 MED ORDER — PALONOSETRON HCL INJECTION 0.25 MG/5ML
0.2500 mg | Freq: Once | INTRAVENOUS | Status: AC
  Administered 2023-01-04: 0.25 mg via INTRAVENOUS
  Filled 2023-01-04: qty 5

## 2023-01-04 MED ORDER — SODIUM CHLORIDE 0.9% FLUSH
10.0000 mL | Freq: Once | INTRAVENOUS | Status: AC
  Administered 2023-01-04: 10 mL

## 2023-01-04 MED ORDER — SODIUM CHLORIDE 0.9 % IV SOLN
600.0000 mg/m2 | Freq: Once | INTRAVENOUS | Status: AC
  Administered 2023-01-04: 1000 mg via INTRAVENOUS
  Filled 2023-01-04: qty 50

## 2023-01-04 MED ORDER — FLUOROURACIL CHEMO INJECTION 2.5 GM/50ML
600.0000 mg/m2 | Freq: Once | INTRAVENOUS | Status: AC
Start: 2023-01-04 — End: 2023-01-04
  Administered 2023-01-04: 1000 mg via INTRAVENOUS
  Filled 2023-01-04: qty 20

## 2023-01-04 NOTE — Progress Notes (Signed)
Patient Care Team: Mahlon Gammon, MD as PCP - General (Internal Medicine) Tonny Bollman, MD as PCP - Cardiology (Cardiology) Bernette Redbird, MD as Consulting Physician (Gastroenterology) Antony Contras, MD as Consulting Physician (Ophthalmology) [place], Amy, MD as Consulting Physician (Dermatology) Mast, Man X, NP as Nurse Practitioner (Internal Medicine) Pershing Proud, RN as Oncology Nurse Navigator Donnelly Angelica, RN as Oncology Nurse Navigator Serena Croissant, MD as Consulting Physician (Hematology and Oncology) Harriette Bouillon, MD as Consulting Physician (General Surgery)  DIAGNOSIS:  Encounter Diagnosis  Name Primary?   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 Indeterminate 1.2 cm right breast mass    09/30/2022 Initial Biopsy   Right breast biopsy demonstrates intraductal papilloma and no evidence of atypia or malignancy.   10/18/2022 Surgery   Left mastectomy scheduled with Dr. Luisa Hart   10/18/2022 Surgery   Left mastectomy: Grade 2 invasive lobular cancer 2.7 cm with DCIS, margins negative, suspicious for LVI, ER 0%, PR 0%, HER2  negative, Ki-67 5%, 0/7 lymph nodes Right mastectomy: Benign   11/23/2022 -  Chemotherapy   Patient is on Treatment Plan : BREAST Adjuvant CMF IV q21d       CHIEF COMPLIANT:   History of Present Illness   The patient, currently undergoing her third cycle of CMF chemotherapy for breast cancer, reports experiencing significant fatigue, which she describes as sporadic and not necessarily linked to the timing of her treatments. She wakes up feeling fine but loses energy quickly throughout the day. She also reports some hair loss, dry mouth, and balance issues, the latter of which had begun prior to the start of her chemotherapy. She has not experienced any falls but has been using a walking stick for support. The patient denies experiencing any nausea. Her appetite and bowel movements are normal. She has a dental appointment scheduled and used to participate in water exercises, both of which she is unsure if she should continue during treatment. She also inquires about the possibility of flying to visit her children. The patient expresses feeling generally well, aside from the fatigue and reduced stamina, and is curious about the potential for increased side effects as treatment progresses.         ALLERGIES:  has No Known Allergies.  MEDICATIONS:  Current Outpatient Medications  Medication Sig Dispense Refill   acetaminophen (TYLENOL) 650 MG CR tablet Take 650 mg by mouth as needed for pain.     ascorbic acid (VITAMIN C) 500 MG tablet Take 500 mg by mouth daily.     Calcium Carb-Cholecalciferol (CALCIUM 600+D) 600-800 MG-UNIT TABS Take 3 tablets by mouth daily.     clopidogrel (PLAVIX) 75 MG tablet TAKE 1  TABLET BY MOUTH DAILY 90 tablet 2   denosumab (PROLIA) 60 MG/ML SOSY injection Inject 60 mg into the skin every 6 (six) months. Administered at [facility] q36m     ferrous sulfate 325 (65 FE) MG tablet Take 325 mg by mouth daily with breakfast.     gabapentin (NEURONTIN) 100 MG  capsule Take 100 mg by mouth daily.     levothyroxine (SYNTHROID) 112 MCG tablet Take 1 tablet (112 mcg total) by mouth daily. 90 tablet 3   lidocaine-prilocaine (EMLA) cream Apply to affected area once (Patient not taking: Reported on 11/16/2022)     loratadine (CLARITIN) 10 MG tablet Take 10 mg by mouth as needed for allergies. (Patient not taking: Reported on 11/16/2022)     melatonin 5 MG TABS Take 5 mg by mouth at bedtime.     nitroGLYCERIN (NITROSTAT) 0.4 MG SL tablet TAKE 1 TABLET UNDER THE TONGUE EVERY 5 MINUTES AS NEEDED FOR CHEST PAIN (Patient not taking: Reported on 11/16/2022) 25 tablet 6   ondansetron (ZOFRAN) 8 MG tablet Take 1 tablet (8 mg total) by mouth every 8 (eight) hours as needed for nausea or vomiting. Start on the third day after chemotherapy. (Patient not taking: Reported on 11/09/2022) 30 tablet 1   oxyCODONE (OXY IR/ROXICODONE) 5 MG immediate release tablet Take 1 tablet (5 mg total) by mouth every 6 (six) hours as needed for severe pain. (Patient not taking: Reported on 11/03/2022) 15 tablet 0   prochlorperazine (COMPAZINE) 10 MG tablet Take 1 tablet (10 mg total) by mouth every 6 (six) hours as needed for nausea or vomiting. (Patient not taking: Reported on 11/16/2022) 30 tablet 1   rosuvastatin (CRESTOR) 20 MG tablet TAKE 1 TABLET BY MOUTH DAILY 90 tablet 3   Study - [name] - olpasiran (AMG 890) 142 mg/mL or placebo SQ injection (PI-Hilty) Inject 142 mg into the skin once. For investigational use only - Inject 1 mL (1 prefilled syringe) subcutaneously into appropriate injection site per protocol. (Approved injection site(s): upper arm, upper thigh, abdomen). Please contact [facility] if any questions.     VITAMIN D PO Take 1 capsule by mouth daily.     No current facility-administered medications for this visit.    PHYSICAL EXAMINATION: ECOG PERFORMANCE STATUS: 1 - Symptomatic but completely ambulatory  Vitals:   01/04/23 1221  BP: 139/84  Pulse: 87   Resp: 18  Temp: 97.8 F (36.6 C)  SpO2: 95%   Filed Weights   01/04/23 1221  Weight: 153 lb 14.4 oz (69.8 kg)    Physical Exam   NEUROLOGICAL: Slight imbalance noted.      (exam performed in the presence of a chaperone)  LABORATORY DATA:  I have reviewed the data as listed    Latest Ref Rng & Units 01/04/2023   11:26 AM 12/14/2022   10:04 AM 11/23/2022   11:39 AM  CMP  Glucose 70 - 99 mg/dL 93  132  98   BUN 8 - 23 mg/dL 12  11  17    Creatinine 0.44 - 1.00 mg/dL 4.40  1.02  7.25   Sodium 135 - 145 mmol/L 139  140  139   Potassium 3.5 - 5.1 mmol/L 4.0  4.0  4.1   Chloride 98 - 111 mmol/L 108  109  106   CO2 22 - 32 mmol/L 26  26  27    Calcium 8.9 - 10.3 mg/dL 8.6  8.8  9.1   Total Protein 6.5 - 8.1  g/dL 6.2  6.3  6.4   Total Bilirubin 0.3 - 1.2 mg/dL 0.5  0.4  0.4   Alkaline Phos 38 - 126 U/L 50  53  64   AST 15 - 41 U/L 14  15  14    ALT 0 - 44 U/L 13  13  14      Lab Results  Component Value Date   WBC 4.7 01/04/2023   HGB 12.5 01/04/2023   HCT 36.1 01/04/2023   MCV 96.5 01/04/2023   PLT 236 01/04/2023   NEUTROABS 2.1 01/04/2023    ASSESSMENT & PLAN:  Malignant neoplasm of upper-outer quadrant of left breast in female, estrogen receptor negative (HCC) 09/13/2022: Screening mammogram detected spiculated left breast mass upper outer quadrant 5 o'clock position measuring 2.1 cm, axilla negative, 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  Indeterminate 1.2 cm right breast mass   Recommendation: 10/18/2022:Left mastectomy: Grade 2 invasive lobular cancer 2.7 cm with DCIS, margins negative, suspicious for LVI, ER 0%, PR 0%, HER2 negative, Ki-67 5%, 0/7 lymph nodes Right mastectomy: Benign adjuvant CMF x 6 cycles Adjuvant radiation  therapy _______________________________________________________________________________________________________________________________________________________________   Current treatment: CMF cycle 3   She is tolerating treatment well.   Treatment related fatigue: managing with energy conservation We reviewed diet, exercise, and lifestyle recommendations during chemotherapy Her labs are stable she will proceed with treatment without dose modifications.     RTC in 3 weeks for labs, f/u, and her next treatment.    ------------------------------------- Assessment and Plan    Breast Cancer on CMF chemotherapy Tolerating treatment well with minimal side effects. Reports fatigue and mild hair loss. No nausea or bowel issues. Blood work shows normal white count, hemoglobin, platelets, electrolytes, kidney and liver function. Slight decrease in calcium and protein, but overall nutritional state is good. -Continue CMF chemotherapy as planned. -Next treatment scheduled for November 20th, followed by December 11th.  Balance Issues Reports worsening balance, but no falls. Currently using a stick for assistance. -Continue to monitor and use assistive device as needed.  Dental Care during Chemotherapy Upcoming dental appointment in November. -Postpone dental appointment until one month after last chemotherapy treatment.  Exercise during Chemotherapy Inquires about water exercise. -Encourage to continue water exercise as tolerated.  Travel during Chemotherapy Inquires about air travel. -Approve air travel as tolerated.  Post-Chemotherapy Radiation Discussed potential for radiation therapy after completion of chemotherapy. Patient is fit and healthy, making her a good candidate for newer short-course radiation techniques. -Plan to discuss further with radiation oncologist after completion of chemotherapy.          No orders of the defined types were placed in this encounter.  The  patient has a good understanding of the overall plan. she agrees with it. she will call with any problems that may develop before the next visit here. Total time spent: 30 mins including face to face time and time spent for planning, charting and co-ordination of care   Tamsen Meek, MD 01/04/23

## 2023-01-04 NOTE — Assessment & Plan Note (Signed)
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  Indeterminate 1.2 cm right breast mass   Recommendation: 10/18/2022:Left mastectomy: Grade 2 invasive lobular cancer 2.7 cm with DCIS, margins negative, suspicious for LVI, ER 0%, PR 0%, HER2 negative, Ki-67 5%, 0/7 lymph nodes Right mastectomy: Benign adjuvant CMF x 6 cycles Adjuvant radiation therapy _______________________________________________________________________________________________________________________________________________________________   Current treatment: CMF cycle 3   She is tolerating treatment well.   Treatment related fatigue: managing with energy conservation We reviewed diet, exercise, and lifestyle recommendations during chemotherapy Her labs are stable she will proceed with treatment without dose modifications.     RTC in 3 weeks for labs, f/u, and her next treatment.

## 2023-01-05 ENCOUNTER — Ambulatory Visit: Payer: Medicare Other | Admitting: Adult Health

## 2023-01-05 ENCOUNTER — Encounter: Payer: Self-pay | Admitting: Physical Therapy

## 2023-01-05 ENCOUNTER — Ambulatory Visit: Payer: Medicare Other | Admitting: Physical Therapy

## 2023-01-05 ENCOUNTER — Ambulatory Visit: Payer: Medicare Other

## 2023-01-05 ENCOUNTER — Other Ambulatory Visit: Payer: Medicare Other

## 2023-01-05 DIAGNOSIS — R262 Difficulty in walking, not elsewhere classified: Secondary | ICD-10-CM | POA: Diagnosis not present

## 2023-01-05 DIAGNOSIS — M25612 Stiffness of left shoulder, not elsewhere classified: Secondary | ICD-10-CM

## 2023-01-05 DIAGNOSIS — R293 Abnormal posture: Secondary | ICD-10-CM

## 2023-01-05 DIAGNOSIS — M6281 Muscle weakness (generalized): Secondary | ICD-10-CM

## 2023-01-05 DIAGNOSIS — Z483 Aftercare following surgery for neoplasm: Secondary | ICD-10-CM | POA: Diagnosis not present

## 2023-01-05 DIAGNOSIS — C50412 Malignant neoplasm of upper-outer quadrant of left female breast: Secondary | ICD-10-CM

## 2023-01-05 DIAGNOSIS — M25611 Stiffness of right shoulder, not elsewhere classified: Secondary | ICD-10-CM | POA: Diagnosis not present

## 2023-01-05 NOTE — Therapy (Signed)
OUTPATIENT PHYSICAL THERAPY BREAST CANCER TREATMENT   Patient Name: Jessica [name] MRN: [id number] DOB:30-Jan-1941, 82 y.o., female Today's Date: 01/05/2023  END OF SESSION:  PT End of Session - 01/05/23 1406     Visit Number 13    Number of Visits 21    Date for PT Re-Evaluation 02/02/23    PT Start Time 1403    PT Stop Time 1454    PT Time Calculation (min) 51 min    Activity Tolerance Patient tolerated treatment well    Behavior During Therapy [facility] for tasks assessed/performed               Past Medical History:  Diagnosis Date   Anemia 12/2015   Bronchitis, chronic (HCC)    in her 32s   Cataract    Chronic kidney disease    in 20s had nephritis   Closed fracture of right distal humerus    right   Coronary artery disease    Hand, foot and mouth disease (HFMD)    Hearing loss 05/03/2016   High cholesterol    Hypothyroidism    Myocardial infarction (HCC)    Osteopenia    Pericarditis 01/1999   Pneumothorax 01/1999   Thyroid disorder    Vitamin B 12 deficiency    Past Surgical History:  Procedure Laterality Date   birthmark      removed,left leg   BREAST BIOPSY Left 09/13/2022   [place] LT BREAST BX W LOC DEV 1ST LESION IMG BX SPEC US GUIDE 09/13/2022 [facility] MAMMOGRAPHY   CARDIAC CATHETERIZATION     CATARACT EXTRACTION  2017   CORONARY STENT INTERVENTION N/A 09/01/2020   Procedure: CORONARY STENT INTERVENTION;  Surgeon: Lyn Records, MD;  Location: [facility];  Service: Cardiovascular;  Laterality: N/A;   LEFT HEART CATH AND CORONARY ANGIOGRAPHY N/A 09/01/2020   Procedure: LEFT HEART CATH AND CORONARY ANGIOGRAPHY;  Surgeon: Lyn Records, MD;  Location: [facility];  Service: Cardiovascular;  Laterality: N/A;   ORIF HUMERUS FRACTURE Right 04/10/2019   Procedure: RIGHT OPEN REDUCTION INTERNAL FIXATION (ORIF) DISTAL HUMERUS FRACTURE WITH EXTENSION;  Surgeon: Bjorn Pippin, MD;  Location: [facility];  Service: Orthopedics;   Laterality: Right;   PORTACATH PLACEMENT N/A 11/15/2022   Procedure: PORT PLACEMENT WITH ULTRASOUND GUIDANCE;  Surgeon: Harriette Bouillon, MD;  Location: [facility];  Service: General;  Laterality: N/A;   SIMPLE MASTECTOMY WITH AXILLARY SENTINEL NODE BIOPSY Bilateral 10/18/2022   Procedure: BILATERAL SIMPLE MASTECTOMY;  Surgeon: Harriette Bouillon, MD;  Location: [facility];  Service: General;  Laterality: Bilateral;  PEC BLOCK   TONSILLECTOMY AND ADENOIDECTOMY  1952?   Patient Active Problem List   Diagnosis Date Noted   Malignant neoplasm of upper-outer quadrant of left breast in female, estrogen receptor negative (HCC) 09/21/2022   Atherosclerotic heart disease of native coronary artery with other forms of angina pectoris (HCC) 04/08/2022   Swelling of left foot 04/07/2022   Left hand fracture, sequela 04/07/2022   Depression with anxiety 04/07/2022   NSTEMI (non-ST elevated myocardial infarction) (HCC) 09/01/2020   Closed fracture of right distal humerus 04/10/2019   Dyspnea 05/09/2018   Vitamin B 12 deficiency 05/09/2018   Cough 03/06/2018   Senile osteoporosis 11/20/2017   Iron deficiency 04/26/2017   Lichen sclerosus 04/26/2017   External hemorrhoid 04/26/2017   Weakness of left leg 06/07/2016   Balance disorder 05/24/2016   Memory deficit 05/24/2016   Anemia 05/19/2016   Hearing loss  05/03/2016   Hypothyroidism 07/24/2013   Bradycardia on ECG 07/24/2013   Numbness 08/07/2012   HYPERLIPIDEMIA 12/29/2006    REFERRING PROVIDER: Dr. Harriette Bouillon  REFERRING DIAG: Left breast cancer  THERAPY DIAG:  Difficulty in walking, not elsewhere classified  Muscle weakness (generalized)  Stiffness of left shoulder, not elsewhere classified  Stiffness of right shoulder, not elsewhere classified  Aftercare following surgery for neoplasm  Abnormal posture  Malignant neoplasm of upper-outer quadrant of left breast in female, estrogen receptor negative (HCC)  Rationale for  Evaluation and Treatment: Rehabilitation  ONSET DATE: 10/18/2022  SUBJECTIVE:                                                                                                                                                                                           SUBJECTIVE STATEMENT: I am feeling good. I am on steroids.   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 She underwent a bilateral mastectomy and left sentinel node biopsy (7 negative nodes) on 10/18/2022. It is triple negative with a Ki67 of 5%. She has some cardiac history and had an elbow fracture in 2022 resulting in surgery where pins were placed.   PATIENT GOALS:  Reassess how my recovery is going related to arm function, pain, and swelling.  PAIN:  Are you having pain? No   PRECAUTIONS: Recent Surgery, left UE Lymphedema risk  RED FLAGS: None   ACTIVITY LEVEL / LEISURE: She is walking half to 1 mile daily   OBJECTIVE:   PATIENT SURVEYS:  QUICK DASH:  Quick Dash - 01/05/23 0001     Open a tight or new jar Mild difficulty    Do heavy household chores (wash walls, wash floors) No difficulty    Carry a shopping bag or briefcase No difficulty    Wash your back No difficulty    Use a knife to cut food No difficulty    Recreational activities in which you take some force or impact through your arm, shoulder, or hand (golf, hammering, tennis) No difficulty    During the past week, to what extent has your arm, shoulder or hand problem interfered with your normal social activities with family, friends, neighbors, or groups? Not at all    During the past week, to what extent has your arm, shoulder or hand problem limited your work or other regular daily activities Not at all    Arm, shoulder, or hand pain. Mild    Tingling (pins and needles) in your arm, shoulder, or hand None    Difficulty Sleeping No difficulty  DASH Score 4.55 %               Quick Dash - 01/05/23 0001     Open a tight or new jar Mild difficulty    Do heavy household chores (wash walls, wash floors) No difficulty    Carry a shopping bag or briefcase No difficulty    Wash your back No difficulty    Use a knife to cut food No difficulty    Recreational activities in which you take some force or impact through your arm, shoulder, or hand (golf, hammering, tennis) No difficulty    During the past week, to what extent has your arm, shoulder or hand problem interfered with your normal social activities with family, friends, neighbors, or groups? Not at all    During the past week, to what extent has your arm, shoulder or hand problem limited your work or other regular daily activities Not at all    Arm, shoulder, or hand pain. Mild    Tingling (pins and needles) in your arm, shoulder, or hand None    Difficulty Sleeping No difficulty    DASH Score 4.55 %            OBSERVATIONS: Bilateral incisions appear to be healing well. There is skin overlap and edema present with tissue on the lateral aspects on her chest.  POSTURE:  Forward head and rounded shoulders   LYMPHEDEMA ASSESSMENT:   UPPER EXTREMITY AROM/PROM:   A/PROM RIGHT   eval   RIGHT 11/14/2022  Shoulder extension 63 60  Shoulder flexion 128 110  Shoulder abduction 137 136  Shoulder internal rotation 68 75  Shoulder external rotation 73 75                          (Blank rows = not tested)   A/PROM LEFT   eval LEFT 11/14/2022 LET  12/05/22  Shoulder extension 53 49   Shoulder flexion 135 109 154  Shoulder abduction 146 114 161  Shoulder internal rotation 74 77   Shoulder external rotation 78 70                           (Blank rows = not tested)     UPPER EXTREMITY STRENGTH: WFL  LOWER EXTREMITY STRENGTH:   MMT Right eval  Hip flexion 5/5  Hip extension 4/5  Hip abduction 4/5  Hip adduction   Hip internal rotation   Hip external rotation   Knee flexion 5/5  Knee  extension 5/5  Ankle dorsiflexion 5/5  Ankle plantarflexion   Ankle inversion   Ankle eversion    (Blank rows = not tested)  A/PROM LEFT eval  Hip flexion 5/5  Hip extension 3/5  Hip abduction 4+/5  Hip adduction   Hip internal rotation   Hip external rotation   Knee flexion 5/5  Knee extension 5/5  Ankle dorsiflexion 5/5  Ankle plantarflexion   Ankle inversion   Ankle eversion    (Blank rows = not tested)  FUNCTIONAL TEST:  30 SEC SIT TO STAND: 9 reps which is below average for her age  BERG Balance Test: 44/56, anything less than 45 is a greater risk of falling  LYMPHEDEMA ASSESSMENTS: NOT ASSESSED AS PT IS NOT HAVING ANY LYMPH NODES REMOVED   LANDMARK RIGHT   eval RIGHT 11/14/2022  10 cm proximal to olecranon process  Not entered due pt was not originally  having nodes removed 27.6  Olecranon process   24.3  10 cm proximal to ulnar styloid process   20.1  Just proximal to ulnar styloid process   15.1  Across hand at thumb web space   16.7  At base of 2nd digit   6.1  (Blank rows = not tested)   LANDMARK LEFT   eval LEFT 11/14/2022  10 cm proximal to olecranon process   Not entered due pt was not originally having nodes removed 26.4  Olecranon process   23.4  10 cm proximal to ulnar styloid process   19.5  Just proximal to ulnar styloid process   13.9  Across hand at thumb web space   16.4  At base of 2nd digit   6.1  (Blank rows = not tested) Surgery type/Date: 10/18/2022 Number of lymph nodes removed: 7 Current/past treatment (chemo, radiation, hormone therapy): none Other symptoms:  Heaviness/tightness No Pain Yes Pitting edema No Infections No Decreased scar mobility Yes Stemmer sign No   TODAY'S TREATMENT: Date: 01/05/23 Nustep level 4 x 10 min - seat at 8 no Ues3, 545 steps Reassessed berg balance and 30 sec sit to stand Reassessed goals Leg press x 85 lbs x 20 reps 3 way hip machine x 20 reps each 45# with pt returning therapist demo Heel/toe  on airex x 8 reps with occasional finger touch assist on // bars then sidestepping on airex x 4 with pt reporting this was less challenging  01/02/23 Nustep level 3 x 10 min - seat at 8 no UEs with v/c to keep knees from adducting, 579 steps Leg press x 80 lbs x 20 reps 3 way hip machine x 20 reps each 45# with pt returning therapist demo Supine bridging x 10 reps with v/c to keep knees hip width apart  Pelvic tilts x 10 reps with v/c for correct form Pelvic tilts and mini marches x 10 reps each with v/c to move slowly and controlled Pelvic tilts and mini hip abd in hooklying x 10 reps with v/c to keep core engaged to avoid movement at lower trunk Stepping over hurdles in // bars with 1 cobblestone square in between 2 of the hurdles and an airex pad in between 2 of the hurdles with occasional min A and CGA throughout x 6 reps going forward, 4 reps going side to side; then removed the air ex and cobble stone and pt did much better and added x 2 backwards for this - pt found cobblestone and airex challenging and she lost balance 2x and required min A to correct  12/26/22 Nustep level 4 x 11 min - seat at 8 no UEs with v/c to keep knees from adducting, 546 steps Leg press x 80 lbs x 20 reps 3 way hip machine x 15 reps each 45# with pt returning therapist demo Supine bridging x 10 reps with v/c to keep knees hip width apart  Pelvic tilts x 10 reps with v/c for correct form Pelvic tilts and mini marches x 10 reps each with v/c to move slowly and controlled Pelvic tilts and mini hip abd in hooklying x 10 reps with v/c to keep core engaged to avoid movement at lower trunk Measured pt for a compression sleeve for prophylactic use and assisted her with ordering from compression guru - [employer] S1 beige 20-24mmHg Stepping over hurdles in // bars x 10 reps with pt initially able to clear hurdles and when she became distracted she did not clear them -  demo to pt how to step over them intentionally  and pt was able to return demo without touching the hurdles; then repeated going side to side x 6, then backwards x 1    12/21/22 Sidelying on L side: STM to R lower back/SI joint area with increased muscle tightness palpable, then use suction cup with cocoa butter moivng in all directions while also pulling upwards to improve blood flow and decrease tightness Supine bridging x 20 reps with v/c to keep knees hip width apart - pt reported decreased discomfort after this Supine in hook lying with folded pillow between knees and gait belt around thighs alternating between isometric hip abduction and adduction with 5 sec holds x 5 reps each  Nustep level 2 x 10 min - seat at 8 no UEs with v/c to keep knees from adducting, 512 steps  12/19/22 Nustep level 3 x 10 min - seat at 8 no UEs with v/c to keep knees from adducting, 540 steps 3 way hip machine:  45 lbs x 10 reps bilaterally in to flexion, abduction, and extension with pt feeling challenged by this Leg press x 70 lbs x 20 reps with seat at 5 Supine knee to chest stretch x 15 sec hold x 3 Supine trunk rotation stretch x 30 sec holds to stretch R side and decrease back pain Seated edge of mat: hamstring stretch x 30 sec holds x 2 reps bilaterally Piriformis stretch x 30 sec holds bilaterally x 2 Stepping over hurdles x 8 with occasional inability to clear hurdles- pt felt challenged by this especially while talking and walking Forward step ups x 10 reps bilaterally with pt with occasional loss of balance and need for CGA Lateral steps ups x 10 reps bilaterally with occasional loss of balance that required min assist to correct and CGA- minA to Assessed bilateral lateral trunk lymphedema and her L is still considerably swollen- assessed compression bra and it was ill fitting so suggested wearing the [employer] bra instead - educated pt to discuss swelling with her surgeon at her appt on 10/21  12/13/22 Nustep level 5 x 10 min - seat at 8 no  UEs with v/c to keep knees from adducting, 540 steps 3 way hip machine:  40 lbs x 10 reps bilaterally in to flexion, abduction, and extension with pt feeling challenged by this Leg press x 70 lbs x 20 reps with seat at 5 Heel/toe walking on air ex beam x 6 initially with 1 HHA then down to finger tips then to occasional finger touch on bars for support Sidestepping down airex balance beam x 4 reps with occasional finger tip touch on bars  Forward step ups x 10 reps bilaterally with pt feeling challenged by this with occasional finger touch on // bars Lateral steps ups x 10 reps bilaterally with occasional finger touch on bars with increased challenge with this Backwards step ups x 10 with 1 HHA on // bars with increased difficulty and need for assist to get her foot on step  12/07/22 Nustep level 5 x 10 min - seat at 8 no UEs with v/c to keep knees from adducting, 490 steps 3 way hip machine: initially started with 10 reps of extension at 25 lbs but this was too easy so increased resistance to 40 lbs x 10 reps bilaterally in to flexion, abduction, and extension with pt feeling challenged by this IT band rolling bilaterally x 5 min Seated hamstring stretch x 30 sec holds x 2  reps bilaterally Quad stretch in // bars x 60 sec holds bilaterally Heel/toe walking on air ex beam x 4 initially with 2 HHA then down to finger tips Standing on 1 leg on air ex with finger tip assist on // bars with pt able to maintain about 1-3 sec holds Staining on air - 3 way hip with no resistance x 10 reps each with fingertip to HHA to challenge balance   12/05/22: Pulleys x 2 min in direction of flexion and 2 min in direction of abduction Discussed importance of doing supine scapular strengtheing exercises in supine for now due to compensation patterns in standing Educated pt in lymphedema risk reduction practices and issued handouts  11/30/22: Therapeutic Exercises Pulleys into flexion x 2 mins and then in to  abduction x 2 min Ball flexion x 10 on wall with v/c for proper form, abduction x 10 bilaterally with v/c for proper form Instructed pt in supine scapular strengthening exercises with yellow band x 10 reps each with pt returning therapist demo as follows: narrow and wide grip flexion, ER, horizontal abduction, and diagonals - pt did not have any increased pain with these - issued these as part of her HEP Manual Therapy P/ROM to bil shoulders into flexion, abd and ER to pts tolerance with therapist able to achieve full PROM MFR to Lt UE at area of cording from axilla to medial upper arm with numerous cords palpable MLD: Rt inguinal nodes, Rt axillo-inguinal anastomosis and then focused on edema inferior to Rt mastectomy   11/28/22: Therapeutic Exercises Pulleys into flexion x 2 mins and then in to abduction x 2 min Ball flexion x 10 on wall with v/c for proper form, abduction x 10 bilaterally with v/c for proper form Manual Therapy P/ROM to bil shoulders into flexion, abd and ER to pts tolerance with therapist able to achieve full PROM MFR to Lt UE at area of cording from axilla to medial upper arm with numerous cords palpable MLD: Rt inguinal nodes, Rt axillo-inguinal anastomosis and then focused on edema inferior to Rt mastectomy instructing pt in this and having her return demo then repeated on L side which appears more swollen today Edema management: cut 1/2 grey foam and placed in small stockinette to wear across bilateral chest to decrease post op edema  11/21/22: Manual Therapy P/ROM to bil shoulders into flexion, abd and D2 to pts tolerance and with scapular depression by therapist throughout MFR to Lt UE at area of cording from axilla to medial upper arm MLD: Rt inguinal nodes, Rt axillo-inguinal anastomosis and then focused on edema inferior to Rt mastectomy instructing pt in this and having her return demo Therapeutic Exercises Pulleys into flex x 2:30 mins with VC's during to  decrease scapular compensation  PATIENT EDUCATION:  Education details: HEP Person educated: Patient Education method: Medical illustrator Education comprehension: verbalized understanding and returned demonstration  HOME EXERCISE PROGRAM: Reviewed previously given post op HEP. SLS and tandem stance at counter  Bridging SI joint stabilization in to isometric abduction/adduction  ASSESSMENT:  CLINICAL IMPRESSION: Assessed pt's progress towards goals in therapy. She is progressing twoards all goals in therapy. Her 30 sec sit to stand has improved as well as her BERG balance score. Added new goal for SLS since this is still very challenging for her. Asked pt to bring photos of gym equipment available to her to help her figure out a routine for her to continue with after discharge. Pt would benefit from additional skilled  PT services to continue to improve high level balance and strength to reduce fall risk and progress pt towards independence with a home exercise program.   Pt will benefit from skilled therapeutic intervention to improve on the following deficits: Decreased knowledge of precautions, impaired UE functional use, pain, decreased ROM, postural dysfunction.   PT treatment/interventions: ADL/Self care home management, Therapeutic exercises, Therapeutic activity, Patient/Family education, Self Care, Manual lymph drainage, scar mobilization, Manual therapy, and Re-evaluation   GOALS: Goals reviewed with patient? Yes  LONG TERM GOALS:  (STG=LTG)  GOALS Name Target Date  Goal status  1 Pt will demonstrate she has regained full shoulder ROM and function post operatively compared to baselines.  Baseline: 12/12/2022 MET  12/05/22  2 Patient will report >/= 50% less pain and sensitivity in her chest midline to tolerate daily tasks with greater ease 12/12/2022 MET 12/05/22 at least 50% improved  3 Patient will improve her DASH score to be back to baseline (4.55) for improved  overall UE function. 12/12/2022 MET 01/05/23: 4.55  4 Patient will report good understanding of lymphedema risk reduction practices. 12/12/2022 ONGOING 01/05/23: still requires some cuieng 12/05/22- re eductated pt and issued handouts  5 Pt will receive a compression sleeve to wear when she flies for prophylactic use. 01/02/23 MET 01/05/23  6 Pt will be able to complete 12 sit to stands in 30 sec wihtout UEs to reduce fall risk. 01/02/23 ONGOING 01/05/23: 10 reps   7 Pt will improve BERG balance score to 50 to decrease risk of falls. 01/02/23 MET 01/05/23 - 51  8 Pt will be able to complete SLS for 20 sec to decrease fall risk. 02/02/23 NEW     PLAN:  PT FREQUENCY/DURATION: 2x/week for 4 weeks  PLAN FOR NEXT SESSION:  continue hurdles and add foam pads as well as balance exercises, measure for compression sleeve, begin high level balance and update HEP,  begin scar massage when fully healed (2-3 more weeks ~9/30), MLD as needed   [facility]  [address]  [place] [address]  [phone]    Milagros Loll [place], PT 01/05/2023, 2:59 PM

## 2023-01-10 ENCOUNTER — Ambulatory Visit: Payer: Medicare Other | Attending: Surgery | Admitting: Physical Therapy

## 2023-01-10 ENCOUNTER — Encounter: Payer: Self-pay | Admitting: Physical Therapy

## 2023-01-10 DIAGNOSIS — M25612 Stiffness of left shoulder, not elsewhere classified: Secondary | ICD-10-CM

## 2023-01-10 DIAGNOSIS — Z171 Estrogen receptor negative status [ER-]: Secondary | ICD-10-CM

## 2023-01-10 DIAGNOSIS — Z483 Aftercare following surgery for neoplasm: Secondary | ICD-10-CM

## 2023-01-10 DIAGNOSIS — M6281 Muscle weakness (generalized): Secondary | ICD-10-CM

## 2023-01-10 DIAGNOSIS — R293 Abnormal posture: Secondary | ICD-10-CM

## 2023-01-10 DIAGNOSIS — R262 Difficulty in walking, not elsewhere classified: Secondary | ICD-10-CM

## 2023-01-10 DIAGNOSIS — M25611 Stiffness of right shoulder, not elsewhere classified: Secondary | ICD-10-CM

## 2023-01-10 DIAGNOSIS — C50412 Malignant neoplasm of upper-outer quadrant of left female breast: Secondary | ICD-10-CM | POA: Diagnosis not present

## 2023-01-10 NOTE — Therapy (Signed)
OUTPATIENT PHYSICAL THERAPY BREAST CANCER TREATMENT   Patient Name: Jessica [name] MRN: [id number] DOB:1940-10-10, 82 y.o., female Today's Date: 01/10/2023  END OF SESSION:  PT End of Session - 01/10/23 1407     Visit Number 14    Number of Visits 21    Date for PT Re-Evaluation 02/02/23    PT Start Time 1404    PT Stop Time 1451    PT Time Calculation (min) 47 min    Activity Tolerance Patient tolerated treatment well    Behavior During Therapy [facility] for tasks assessed/performed                Past Medical History:  Diagnosis Date   Anemia 12/2015   Bronchitis, chronic (HCC)    in her 67s   Cataract    Chronic kidney disease    in 20s had nephritis   Closed fracture of right distal humerus    right   Coronary artery disease    Hand, foot and mouth disease (HFMD)    Hearing loss 05/03/2016   High cholesterol    Hypothyroidism    Myocardial infarction (HCC)    Osteopenia    Pericarditis 01/1999   Pneumothorax 01/1999   Thyroid disorder    Vitamin B 12 deficiency    Past Surgical History:  Procedure Laterality Date   birthmark      removed,left leg   BREAST BIOPSY Left 09/13/2022   [place] LT BREAST BX W LOC DEV 1ST LESION IMG BX SPEC US GUIDE 09/13/2022 [facility]   CARDIAC CATHETERIZATION     CATARACT EXTRACTION  2017   CORONARY STENT INTERVENTION N/A 09/01/2020   Procedure: CORONARY STENT INTERVENTION;  Surgeon: Lyn Records, MD;  Location: [facility];  Service: Cardiovascular;  Laterality: N/A;   LEFT HEART CATH AND CORONARY ANGIOGRAPHY N/A 09/01/2020   Procedure: LEFT HEART CATH AND CORONARY ANGIOGRAPHY;  Surgeon: Lyn Records, MD;  Location: [facility];  Service: Cardiovascular;  Laterality: N/A;   ORIF HUMERUS FRACTURE Right 04/10/2019   Procedure: RIGHT OPEN REDUCTION INTERNAL FIXATION (ORIF) DISTAL HUMERUS FRACTURE WITH EXTENSION;  Surgeon: Bjorn Pippin, MD;  Location: [facility];  Service: Orthopedics;   Laterality: Right;   PORTACATH PLACEMENT N/A 11/15/2022   Procedure: PORT PLACEMENT WITH ULTRASOUND GUIDANCE;  Surgeon: Harriette Bouillon, MD;  Location: [facility];  Service: General;  Laterality: N/A;   SIMPLE MASTECTOMY WITH AXILLARY SENTINEL NODE BIOPSY Bilateral 10/18/2022   Procedure: BILATERAL SIMPLE MASTECTOMY;  Surgeon: Harriette Bouillon, MD;  Location: [facility];  Service: General;  Laterality: Bilateral;  PEC BLOCK   TONSILLECTOMY AND ADENOIDECTOMY  1952?   Patient Active Problem List   Diagnosis Date Noted   Malignant neoplasm of upper-outer quadrant of left breast in female, estrogen receptor negative (HCC) 09/21/2022   Atherosclerotic heart disease of native coronary artery with other forms of angina pectoris (HCC) 04/08/2022   Swelling of left foot 04/07/2022   Left hand fracture, sequela 04/07/2022   Depression with anxiety 04/07/2022   NSTEMI (non-ST elevated myocardial infarction) (HCC) 09/01/2020   Closed fracture of right distal humerus 04/10/2019   Dyspnea 05/09/2018   Vitamin B 12 deficiency 05/09/2018   Cough 03/06/2018   Senile osteoporosis 11/20/2017   Iron deficiency 04/26/2017   Lichen sclerosus 04/26/2017   External hemorrhoid 04/26/2017   Weakness of left leg 06/07/2016   Balance disorder 05/24/2016   Memory deficit 05/24/2016   Anemia 05/19/2016   Hearing  loss 05/03/2016   Hypothyroidism 07/24/2013   Bradycardia on ECG 07/24/2013   Numbness 08/07/2012   HYPERLIPIDEMIA 12/29/2006    REFERRING PROVIDER: Dr. Harriette Bouillon  REFERRING DIAG: Left breast cancer  THERAPY DIAG:  Difficulty in walking, not elsewhere classified  Muscle weakness (generalized)  Stiffness of left shoulder, not elsewhere classified  Stiffness of right shoulder, not elsewhere classified  Aftercare following surgery for neoplasm  Abnormal posture  Malignant neoplasm of upper-outer quadrant of left breast in female, estrogen receptor negative (HCC)  Rationale for  Evaluation and Treatment: Rehabilitation  ONSET DATE: 10/18/2022  SUBJECTIVE:                                                                                                                                                                                           SUBJECTIVE STATEMENT: I feel really zonked today.   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 She underwent a bilateral mastectomy and left sentinel node biopsy (7 negative nodes) on 10/18/2022. It is triple negative with a Ki67 of 5%. She has some cardiac history and had an elbow fracture in 2022 resulting in surgery where pins were placed.   PATIENT GOALS:  Reassess how my recovery is going related to arm function, pain, and swelling.  PAIN:  Are you having pain? No   PRECAUTIONS: Recent Surgery, left UE Lymphedema risk  RED FLAGS: None   ACTIVITY LEVEL / LEISURE: She is walking half to 1 mile daily   OBJECTIVE:   PATIENT SURVEYS:  QUICK DASH:      OBSERVATIONS: Bilateral incisions appear to be healing well. There is skin overlap and edema present with tissue on the lateral aspects on her chest.  POSTURE:  Forward head and rounded shoulders   LYMPHEDEMA ASSESSMENT:   UPPER EXTREMITY AROM/PROM:   A/PROM RIGHT   eval   RIGHT 11/14/2022  Shoulder extension 63 60  Shoulder flexion 128 110  Shoulder abduction 137 136  Shoulder internal rotation 68 75  Shoulder external rotation 73 75                          (Blank rows = not tested)   A/PROM LEFT   eval LEFT 11/14/2022 LET  12/05/22  Shoulder extension 53 49   Shoulder flexion 135 109 154  Shoulder abduction 146 114 161  Shoulder internal rotation 74 77   Shoulder external rotation 78 70                           (  Blank rows = not tested)     UPPER EXTREMITY STRENGTH: WFL  LOWER EXTREMITY STRENGTH:   MMT Right eval  Hip flexion 5/5  Hip extension 4/5  Hip abduction 4/5   Hip adduction   Hip internal rotation   Hip external rotation   Knee flexion 5/5  Knee extension 5/5  Ankle dorsiflexion 5/5  Ankle plantarflexion   Ankle inversion   Ankle eversion    (Blank rows = not tested)  A/PROM LEFT eval  Hip flexion 5/5  Hip extension 3/5  Hip abduction 4+/5  Hip adduction   Hip internal rotation   Hip external rotation   Knee flexion 5/5  Knee extension 5/5  Ankle dorsiflexion 5/5  Ankle plantarflexion   Ankle inversion   Ankle eversion    (Blank rows = not tested)  FUNCTIONAL TEST:  30 SEC SIT TO STAND: 9 reps which is below average for her age  BERG Balance Test: 44/56, anything less than 45 is a greater risk of falling  LYMPHEDEMA ASSESSMENTS: NOT ASSESSED AS PT IS NOT HAVING ANY LYMPH NODES REMOVED   LANDMARK RIGHT   eval RIGHT 11/14/2022  10 cm proximal to olecranon process  Not entered due pt was not originally having nodes removed 27.6  Olecranon process   24.3  10 cm proximal to ulnar styloid process   20.1  Just proximal to ulnar styloid process   15.1  Across hand at thumb web space   16.7  At base of 2nd digit   6.1  (Blank rows = not tested)   LANDMARK LEFT   eval LEFT 11/14/2022  10 cm proximal to olecranon process   Not entered due pt was not originally having nodes removed 26.4  Olecranon process   23.4  10 cm proximal to ulnar styloid process   19.5  Just proximal to ulnar styloid process   13.9  Across hand at thumb web space   16.4  At base of 2nd digit   6.1  (Blank rows = not tested) Surgery type/Date: 10/18/2022 Number of lymph nodes removed: 7 Current/past treatment (chemo, radiation, hormone therapy): none Other symptoms:  Heaviness/tightness No Pain Yes Pitting edema No Infections No Decreased scar mobility Yes Stemmer sign No   TODAY'S TREATMENT: Date: 01/10/23 Nustep level 4 x 10 min - seat at 8 no Ues3, 537 steps Leg press x 90 lbs x 20 reps 3 way hip machine x 10 reps each 40# with therapist  provided v/c for positioning Seated hamstring stretch x 2 reps x 60 sec holds - R side more tight Hurdles in // bars in forward direction x 8 with only 1 time where hurdles were knocked over, sidestepping at 6 with no losses of balance and backwards x 2 with increased difficulty with this Supine over half foam roll with arms outstretched x 3 min for pec stretch to decrease tightness Heel/toe on airex x 8 reps with occasional finger touch assist on // bars  Colored circles with cones on top with pt in middle - said a color name and pt had to tap that color with her foot - she had increased difficulty stepping to the R and backwards   01/05/23 Nustep level 4 x 10 min - seat at 8 no Ues3, 545 steps Reassessed berg balance and 30 sec sit to stand Reassessed goals Leg press x 85 lbs x 20 reps 3 way hip machine x 20 reps each 45# with pt returning therapist demo Heel/toe on airex x  8 reps with occasional finger touch assist on // bars then sidestepping on airex x 4 with pt reporting this was less challenging  01/02/23 Nustep level 3 x 10 min - seat at 8 no UEs with v/c to keep knees from adducting, 579 steps Leg press x 80 lbs x 20 reps 3 way hip machine x 20 reps each 45# with pt returning therapist demo Supine bridging x 10 reps with v/c to keep knees hip width apart  Pelvic tilts x 10 reps with v/c for correct form Pelvic tilts and mini marches x 10 reps each with v/c to move slowly and controlled Pelvic tilts and mini hip abd in hooklying x 10 reps with v/c to keep core engaged to avoid movement at lower trunk Stepping over hurdles in // bars with 1 cobblestone square in between 2 of the hurdles and an airex pad in between 2 of the hurdles with occasional min A and CGA throughout x 6 reps going forward, 4 reps going side to side; then removed the air ex and cobble stone and pt did much better and added x 2 backwards for this - pt found cobblestone and airex challenging and she lost balance 2x  and required min A to correct  12/26/22 Nustep level 4 x 11 min - seat at 8 no UEs with v/c to keep knees from adducting, 546 steps Leg press x 80 lbs x 20 reps 3 way hip machine x 15 reps each 45# with pt returning therapist demo Supine bridging x 10 reps with v/c to keep knees hip width apart  Pelvic tilts x 10 reps with v/c for correct form Pelvic tilts and mini marches x 10 reps each with v/c to move slowly and controlled Pelvic tilts and mini hip abd in hooklying x 10 reps with v/c to keep core engaged to avoid movement at lower trunk Measured pt for a compression sleeve for prophylactic use and assisted her with ordering from compression guru - [employer] S1 beige 20-72mmHg Stepping over hurdles in // bars x 10 reps with pt initially able to clear hurdles and when she became distracted she did not clear them - demo to pt how to step over them intentionally and pt was able to return demo without touching the hurdles; then repeated going side to side x 6, then backwards x 1    12/21/22 Sidelying on L side: STM to R lower back/SI joint area with increased muscle tightness palpable, then use suction cup with cocoa butter moivng in all directions while also pulling upwards to improve blood flow and decrease tightness Supine bridging x 20 reps with v/c to keep knees hip width apart - pt reported decreased discomfort after this Supine in hook lying with folded pillow between knees and gait belt around thighs alternating between isometric hip abduction and adduction with 5 sec holds x 5 reps each  Nustep level 2 x 10 min - seat at 8 no UEs with v/c to keep knees from adducting, 512 steps  12/19/22 Nustep level 3 x 10 min - seat at 8 no UEs with v/c to keep knees from adducting, 540 steps 3 way hip machine:  45 lbs x 10 reps bilaterally in to flexion, abduction, and extension with pt feeling challenged by this Leg press x 70 lbs x 20 reps with seat at 5 Supine knee to chest stretch x 15 sec  hold x 3 Supine trunk rotation stretch x 30 sec holds to stretch R side and  decrease back pain Seated edge of mat: hamstring stretch x 30 sec holds x 2 reps bilaterally Piriformis stretch x 30 sec holds bilaterally x 2 Stepping over hurdles x 8 with occasional inability to clear hurdles- pt felt challenged by this especially while talking and walking Forward step ups x 10 reps bilaterally with pt with occasional loss of balance and need for CGA Lateral steps ups x 10 reps bilaterally with occasional loss of balance that required min assist to correct and CGA- minA to Assessed bilateral lateral trunk lymphedema and her L is still considerably swollen- assessed compression bra and it was ill fitting so suggested wearing the [employer] bra instead - educated pt to discuss swelling with her surgeon at her appt on 10/21  12/13/22 Nustep level 5 x 10 min - seat at 8 no UEs with v/c to keep knees from adducting, 540 steps 3 way hip machine:  40 lbs x 10 reps bilaterally in to flexion, abduction, and extension with pt feeling challenged by this Leg press x 70 lbs x 20 reps with seat at 5 Heel/toe walking on air ex beam x 6 initially with 1 HHA then down to finger tips then to occasional finger touch on bars for support Sidestepping down airex balance beam x 4 reps with occasional finger tip touch on bars  Forward step ups x 10 reps bilaterally with pt feeling challenged by this with occasional finger touch on // bars Lateral steps ups x 10 reps bilaterally with occasional finger touch on bars with increased challenge with this Backwards step ups x 10 with 1 HHA on // bars with increased difficulty and need for assist to get her foot on step  12/07/22 Nustep level 5 x 10 min - seat at 8 no UEs with v/c to keep knees from adducting, 490 steps 3 way hip machine: initially started with 10 reps of extension at 25 lbs but this was too easy so increased resistance to 40 lbs x 10 reps bilaterally in to  flexion, abduction, and extension with pt feeling challenged by this IT band rolling bilaterally x 5 min Seated hamstring stretch x 30 sec holds x 2 reps bilaterally Quad stretch in // bars x 60 sec holds bilaterally Heel/toe walking on air ex beam x 4 initially with 2 HHA then down to finger tips Standing on 1 leg on air ex with finger tip assist on // bars with pt able to maintain about 1-3 sec holds Staining on air - 3 way hip with no resistance x 10 reps each with fingertip to HHA to challenge balance   12/05/22: Pulleys x 2 min in direction of flexion and 2 min in direction of abduction Discussed importance of doing supine scapular strengtheing exercises in supine for now due to compensation patterns in standing Educated pt in lymphedema risk reduction practices and issued handouts  11/30/22: Therapeutic Exercises Pulleys into flexion x 2 mins and then in to abduction x 2 min Ball flexion x 10 on wall with v/c for proper form, abduction x 10 bilaterally with v/c for proper form Instructed pt in supine scapular strengthening exercises with yellow band x 10 reps each with pt returning therapist demo as follows: narrow and wide grip flexion, ER, horizontal abduction, and diagonals - pt did not have any increased pain with these - issued these as part of her HEP Manual Therapy P/ROM to bil shoulders into flexion, abd and ER to pts tolerance with therapist able to achieve full PROM MFR  to Lt UE at area of cording from axilla to medial upper arm with numerous cords palpable MLD: Rt inguinal nodes, Rt axillo-inguinal anastomosis and then focused on edema inferior to Rt mastectomy   11/28/22: Therapeutic Exercises Pulleys into flexion x 2 mins and then in to abduction x 2 min Ball flexion x 10 on wall with v/c for proper form, abduction x 10 bilaterally with v/c for proper form Manual Therapy P/ROM to bil shoulders into flexion, abd and ER to pts tolerance with therapist able to achieve full  PROM MFR to Lt UE at area of cording from axilla to medial upper arm with numerous cords palpable MLD: Rt inguinal nodes, Rt axillo-inguinal anastomosis and then focused on edema inferior to Rt mastectomy instructing pt in this and having her return demo then repeated on L side which appears more swollen today Edema management: cut 1/2 grey foam and placed in small stockinette to wear across bilateral chest to decrease post op edema  11/21/22: Manual Therapy P/ROM to bil shoulders into flexion, abd and D2 to pts tolerance and with scapular depression by therapist throughout MFR to Lt UE at area of cording from axilla to medial upper arm MLD: Rt inguinal nodes, Rt axillo-inguinal anastomosis and then focused on edema inferior to Rt mastectomy instructing pt in this and having her return demo Therapeutic Exercises Pulleys into flex x 2:30 mins with VC's during to decrease scapular compensation  PATIENT EDUCATION:  Education details: HEP Person educated: Patient Education method: Medical illustrator Education comprehension: verbalized understanding and returned demonstration  HOME EXERCISE PROGRAM: Reviewed previously given post op HEP. SLS and tandem stance at counter  Bridging SI joint stabilization in to isometric abduction/adduction  ASSESSMENT:  CLINICAL IMPRESSION: Pt has been feeling more fatigued since starting chemotherapy. Educated pt on importance of walking even when she isn't feeling well. Continued to progress high level balances to decrease fall risk. Decreased reps on 3 way hip machine today due to fatigue.   Pt will benefit from skilled therapeutic intervention to improve on the following deficits: Decreased knowledge of precautions, impaired UE functional use, pain, decreased ROM, postural dysfunction.   PT treatment/interventions: ADL/Self care home management, Therapeutic exercises, Therapeutic activity, Patient/Family education, Self Care, Manual lymph  drainage, scar mobilization, Manual therapy, and Re-evaluation   GOALS: Goals reviewed with patient? Yes  LONG TERM GOALS:  (STG=LTG)  GOALS Name Target Date  Goal status  1 Pt will demonstrate she has regained full shoulder ROM and function post operatively compared to baselines.  Baseline: 12/12/2022 MET  12/05/22  2 Patient will report >/= 50% less pain and sensitivity in her chest midline to tolerate daily tasks with greater ease 12/12/2022 MET 12/05/22 at least 50% improved  3 Patient will improve her DASH score to be back to baseline (4.55) for improved overall UE function. 12/12/2022 MET 01/05/23: 4.55  4 Patient will report good understanding of lymphedema risk reduction practices. 12/12/2022 ONGOING 01/05/23: still requires some cuieng 12/05/22- re eductated pt and issued handouts  5 Pt will receive a compression sleeve to wear when she flies for prophylactic use. 01/02/23 MET 01/05/23  6 Pt will be able to complete 12 sit to stands in 30 sec wihtout UEs to reduce fall risk. 01/02/23 ONGOING 01/05/23: 10 reps   7 Pt will improve BERG balance score to 50 to decrease risk of falls. 01/02/23 MET 01/05/23 - 51  8 Pt will be able to complete SLS for 20 sec to decrease fall risk. 02/02/23  NEW     PLAN:  PT FREQUENCY/DURATION: 2x/week for 4 weeks  PLAN FOR NEXT SESSION:  continue hurdles and add foam pads as well as balance exercises, measure for compression sleeve, begin high level balance and update HEP,  begin scar massage when fully healed (2-3 more weeks ~9/30), MLD as needed   [facility]  [address], [address]  [place] [address]  [phone] [place], PT 01/10/2023, 3:01 PM

## 2023-01-11 ENCOUNTER — Other Ambulatory Visit: Payer: Self-pay | Admitting: Internal Medicine

## 2023-01-11 ENCOUNTER — Ambulatory Visit: Payer: Medicare Other | Admitting: Physical Therapy

## 2023-01-11 DIAGNOSIS — M810 Age-related osteoporosis without current pathological fracture: Secondary | ICD-10-CM

## 2023-01-11 MED ORDER — DENOSUMAB 60 MG/ML ~~LOC~~ SOSY
60.0000 mg | PREFILLED_SYRINGE | Freq: Once | SUBCUTANEOUS | Status: AC
Start: 2023-05-11 — End: 2023-05-11
  Administered 2023-05-11: 60 mg via SUBCUTANEOUS

## 2023-01-12 ENCOUNTER — Ambulatory Visit: Payer: Medicare Other | Admitting: Physical Therapy

## 2023-01-12 DIAGNOSIS — C50412 Malignant neoplasm of upper-outer quadrant of left female breast: Secondary | ICD-10-CM

## 2023-01-12 DIAGNOSIS — R262 Difficulty in walking, not elsewhere classified: Secondary | ICD-10-CM

## 2023-01-12 DIAGNOSIS — M6281 Muscle weakness (generalized): Secondary | ICD-10-CM | POA: Diagnosis not present

## 2023-01-12 DIAGNOSIS — M25612 Stiffness of left shoulder, not elsewhere classified: Secondary | ICD-10-CM

## 2023-01-12 DIAGNOSIS — Z483 Aftercare following surgery for neoplasm: Secondary | ICD-10-CM | POA: Diagnosis not present

## 2023-01-12 DIAGNOSIS — M25611 Stiffness of right shoulder, not elsewhere classified: Secondary | ICD-10-CM | POA: Diagnosis not present

## 2023-01-12 DIAGNOSIS — R293 Abnormal posture: Secondary | ICD-10-CM

## 2023-01-12 NOTE — Therapy (Signed)
OUTPATIENT PHYSICAL THERAPY BREAST CANCER TREATMENT   Patient Name: Jessica [name] MRN: [id number] DOB:11/28/1940, 82 y.o., female Today's Date: 01/12/2023  END OF SESSION:  PT End of Session - 01/12/23 1355     Visit Number 15    Number of Visits 21    Date for PT Re-Evaluation 02/02/23    PT Start Time 1303    PT Stop Time 1350    PT Time Calculation (min) 47 min    Activity Tolerance Patient tolerated treatment well    Behavior During Therapy [facility] for tasks assessed/performed                 Past Medical History:  Diagnosis Date   Anemia 12/2015   Bronchitis, chronic (HCC)    in her 35s   Cataract    Chronic kidney disease    in 20s had nephritis   Closed fracture of right distal humerus    right   Coronary artery disease    Hand, foot and mouth disease (HFMD)    Hearing loss 05/03/2016   High cholesterol    Hypothyroidism    Myocardial infarction (HCC)    Osteopenia    Pericarditis 01/1999   Pneumothorax 01/1999   Thyroid disorder    Vitamin B 12 deficiency    Past Surgical History:  Procedure Laterality Date   birthmark      removed,left leg   BREAST BIOPSY Left 09/13/2022   [place] LT BREAST BX W LOC DEV 1ST LESION IMG BX SPEC US GUIDE 09/13/2022 [facility] MAMMOGRAPHY   CARDIAC CATHETERIZATION     CATARACT EXTRACTION  2017   CORONARY STENT INTERVENTION N/A 09/01/2020   Procedure: CORONARY STENT INTERVENTION;  Surgeon: Lyn Records, MD;  Location: [facility];  Service: Cardiovascular;  Laterality: N/A;   LEFT HEART CATH AND CORONARY ANGIOGRAPHY N/A 09/01/2020   Procedure: LEFT HEART CATH AND CORONARY ANGIOGRAPHY;  Surgeon: Lyn Records, MD;  Location: [facility];  Service: Cardiovascular;  Laterality: N/A;   ORIF HUMERUS FRACTURE Right 04/10/2019   Procedure: RIGHT OPEN REDUCTION INTERNAL FIXATION (ORIF) DISTAL HUMERUS FRACTURE WITH EXTENSION;  Surgeon: Bjorn Pippin, MD;  Location: [facility];  Service: Orthopedics;   Laterality: Right;   PORTACATH PLACEMENT N/A 11/15/2022   Procedure: PORT PLACEMENT WITH ULTRASOUND GUIDANCE;  Surgeon: Harriette Bouillon, MD;  Location: [facility];  Service: General;  Laterality: N/A;   SIMPLE MASTECTOMY WITH AXILLARY SENTINEL NODE BIOPSY Bilateral 10/18/2022   Procedure: BILATERAL SIMPLE MASTECTOMY;  Surgeon: Harriette Bouillon, MD;  Location: [facility];  Service: General;  Laterality: Bilateral;  PEC BLOCK   TONSILLECTOMY AND ADENOIDECTOMY  1952?   Patient Active Problem List   Diagnosis Date Noted   Malignant neoplasm of upper-outer quadrant of left breast in female, estrogen receptor negative (HCC) 09/21/2022   Atherosclerotic heart disease of native coronary artery with other forms of angina pectoris (HCC) 04/08/2022   Swelling of left foot 04/07/2022   Left hand fracture, sequela 04/07/2022   Depression with anxiety 04/07/2022   NSTEMI (non-ST elevated myocardial infarction) (HCC) 09/01/2020   Closed fracture of right distal humerus 04/10/2019   Dyspnea 05/09/2018   Vitamin B 12 deficiency 05/09/2018   Cough 03/06/2018   Senile osteoporosis 11/20/2017   Iron deficiency 04/26/2017   Lichen sclerosus 04/26/2017   External hemorrhoid 04/26/2017   Weakness of left leg 06/07/2016   Balance disorder 05/24/2016   Memory deficit 05/24/2016   Anemia 05/19/2016  Hearing loss 05/03/2016   Hypothyroidism 07/24/2013   Bradycardia on ECG 07/24/2013   Numbness 08/07/2012   HYPERLIPIDEMIA 12/29/2006    REFERRING PROVIDER: Dr. Harriette Bouillon  REFERRING DIAG: Left breast cancer  THERAPY DIAG:  Difficulty in walking, not elsewhere classified  Muscle weakness (generalized)  Stiffness of left shoulder, not elsewhere classified  Stiffness of right shoulder, not elsewhere classified  Aftercare following surgery for neoplasm  Abnormal posture  Malignant neoplasm of upper-outer quadrant of left breast in female, estrogen receptor negative (HCC)  Rationale for  Evaluation and Treatment: Rehabilitation  ONSET DATE: 10/18/2022  SUBJECTIVE:                                                                                                                                                                                           SUBJECTIVE STATEMENT: I have a little more energy today. I have slept for the past few nights. I went to yoga this morning and there was a lot of standing on one leg.  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 She underwent a bilateral mastectomy and left sentinel node biopsy (7 negative nodes) on 10/18/2022. It is triple negative with a Ki67 of 5%. She has some cardiac history and had an elbow fracture in 2022 resulting in surgery where pins were placed.   PATIENT GOALS:  Reassess how my recovery is going related to arm function, pain, and swelling.  PAIN:  Are you having pain? No   PRECAUTIONS: Recent Surgery, left UE Lymphedema risk  RED FLAGS: None   ACTIVITY LEVEL / LEISURE: She is walking half to 1 mile daily   OBJECTIVE:   PATIENT SURVEYS:  QUICK DASH:      OBSERVATIONS: Bilateral incisions appear to be healing well. There is skin overlap and edema present with tissue on the lateral aspects on her chest.  POSTURE:  Forward head and rounded shoulders   LYMPHEDEMA ASSESSMENT:   UPPER EXTREMITY AROM/PROM:   A/PROM RIGHT   eval   RIGHT 11/14/2022  Shoulder extension 63 60  Shoulder flexion 128 110  Shoulder abduction 137 136  Shoulder internal rotation 68 75  Shoulder external rotation 73 75                          (Blank rows = not tested)   A/PROM LEFT   eval LEFT 11/14/2022 LET  12/05/22  Shoulder extension 53 49   Shoulder flexion 135 109 154  Shoulder abduction 146 114 161  Shoulder internal rotation 74 77   Shoulder external  rotation 78 70                           (Blank rows = not tested)     UPPER EXTREMITY  STRENGTH: WFL  LOWER EXTREMITY STRENGTH:   MMT Right eval  Hip flexion 5/5  Hip extension 4/5  Hip abduction 4/5  Hip adduction   Hip internal rotation   Hip external rotation   Knee flexion 5/5  Knee extension 5/5  Ankle dorsiflexion 5/5  Ankle plantarflexion   Ankle inversion   Ankle eversion    (Blank rows = not tested)  A/PROM LEFT eval  Hip flexion 5/5  Hip extension 3/5  Hip abduction 4+/5  Hip adduction   Hip internal rotation   Hip external rotation   Knee flexion 5/5  Knee extension 5/5  Ankle dorsiflexion 5/5  Ankle plantarflexion   Ankle inversion   Ankle eversion    (Blank rows = not tested)  FUNCTIONAL TEST:  30 SEC SIT TO STAND: 9 reps which is below average for her age  BERG Balance Test: 44/56, anything less than 45 is a greater risk of falling  LYMPHEDEMA ASSESSMENTS: NOT ASSESSED AS PT IS NOT HAVING ANY LYMPH NODES REMOVED   LANDMARK RIGHT   eval RIGHT 11/14/2022  10 cm proximal to olecranon process  Not entered due pt was not originally having nodes removed 27.6  Olecranon process   24.3  10 cm proximal to ulnar styloid process   20.1  Just proximal to ulnar styloid process   15.1  Across hand at thumb web space   16.7  At base of 2nd digit   6.1  (Blank rows = not tested)   LANDMARK LEFT   eval LEFT 11/14/2022  10 cm proximal to olecranon process   Not entered due pt was not originally having nodes removed 26.4  Olecranon process   23.4  10 cm proximal to ulnar styloid process   19.5  Just proximal to ulnar styloid process   13.9  Across hand at thumb web space   16.4  At base of 2nd digit   6.1  (Blank rows = not tested) Surgery type/Date: 10/18/2022 Number of lymph nodes removed: 7 Current/past treatment (chemo, radiation, hormone therapy): none Other symptoms:  Heaviness/tightness No Pain Yes Pitting edema No Infections No Decreased scar mobility Yes Stemmer sign No   TODAY'S TREATMENT: Date: 01/12/23 Nustep level 4 x 10  min - seat at 8 no Ues3, 680 steps Leg press x 100 lbs x 20 reps 3 way hip machine x 20 reps each 45# with therapist provided v/c for positioning Seated hamstring stretch x 2 reps x 60 sec holds - R side more tight Hurdles in // bars in forward direction x 8 with only 1 time where hurdles were knocked over, sidestepping at 6 with no losses of balance and backwards x 2 with increased difficulty with this Standing on foam - 3 way hip x 10 reps in each direction with no HHA on // bars but close SBA for safety Hurdles in // bars in forward direction x 8 with only 1 time where hurdles were knocked over, sidestepping at 6 with no losses of balance and backwards x 2 with pt feeling challenged but no loss of balance 10 mini squats with pt returning therapist demo  01/10/23 Nustep level 4 x 10 min - seat at 8 no Ues3, 537 steps Leg press x 90 lbs x  20 reps 3 way hip machine x 10 reps each 40# with therapist provided v/c for positioning Seated hamstring stretch x 2 reps x 60 sec holds - R side more tight Seated piriformis x 2 reps x 60 sec holds  Hurdles in // bars in forward direction x 8 with only 1 time where hurdles were knocked over, sidestepping at 6 with no losses of balance and backwards x 2 with increased difficulty in this direction Supine over half foam roll with arms outstretched x 3 min for pec stretch to decrease tightness Heel/toe on airex x 8 reps with occasional finger touch assist on // bars  Colored circles with cones on top with pt in middle - said a color name and pt had to tap that color with her foot - she had increased difficulty stepping to the R and backwards   01/05/23 Nustep level 4 x 10 min - seat at 8 no Ues3, 545 steps Reassessed berg balance and 30 sec sit to stand Reassessed goals Leg press x 85 lbs x 20 reps 3 way hip machine x 20 reps each 45# with pt returning therapist demo Heel/toe on airex x 8 reps with occasional finger touch assist on // bars then sidestepping  on airex x 4 with pt reporting this was less challenging  01/02/23 Nustep level 3 x 10 min - seat at 8 no UEs with v/c to keep knees from adducting, 579 steps Leg press x 80 lbs x 20 reps 3 way hip machine x 20 reps each 45# with pt returning therapist demo Supine bridging x 10 reps with v/c to keep knees hip width apart  Pelvic tilts x 10 reps with v/c for correct form Pelvic tilts and mini marches x 10 reps each with v/c to move slowly and controlled Pelvic tilts and mini hip abd in hooklying x 10 reps with v/c to keep core engaged to avoid movement at lower trunk Stepping over hurdles in // bars with 1 cobblestone square in between 2 of the hurdles and an airex pad in between 2 of the hurdles with occasional min A and CGA throughout x 6 reps going forward, 4 reps going side to side; then removed the air ex and cobble stone and pt did much better and added x 2 backwards for this - pt found cobblestone and airex challenging and she lost balance 2x and required min A to correct  12/26/22 Nustep level 4 x 11 min - seat at 8 no UEs with v/c to keep knees from adducting, 546 steps Leg press x 80 lbs x 20 reps 3 way hip machine x 15 reps each 45# with pt returning therapist demo Supine bridging x 10 reps with v/c to keep knees hip width apart  Pelvic tilts x 10 reps with v/c for correct form Pelvic tilts and mini marches x 10 reps each with v/c to move slowly and controlled Pelvic tilts and mini hip abd in hooklying x 10 reps with v/c to keep core engaged to avoid movement at lower trunk Measured pt for a compression sleeve for prophylactic use and assisted her with ordering from compression guru - [employer] S1 beige 20-2mmHg Stepping over hurdles in // bars x 10 reps with pt initially able to clear hurdles and when she became distracted she did not clear them - demo to pt how to step over them intentionally and pt was able to return demo without touching the hurdles; then repeated going side  to side x  6, then backwards x 1    12/21/22 Sidelying on L side: STM to R lower back/SI joint area with increased muscle tightness palpable, then use suction cup with cocoa butter moivng in all directions while also pulling upwards to improve blood flow and decrease tightness Supine bridging x 20 reps with v/c to keep knees hip width apart - pt reported decreased discomfort after this Supine in hook lying with folded pillow between knees and gait belt around thighs alternating between isometric hip abduction and adduction with 5 sec holds x 5 reps each  Nustep level 2 x 10 min - seat at 8 no UEs with v/c to keep knees from adducting, 512 steps  12/19/22 Nustep level 3 x 10 min - seat at 8 no UEs with v/c to keep knees from adducting, 540 steps 3 way hip machine:  45 lbs x 10 reps bilaterally in to flexion, abduction, and extension with pt feeling challenged by this Leg press x 70 lbs x 20 reps with seat at 5 Supine knee to chest stretch x 15 sec hold x 3 Supine trunk rotation stretch x 30 sec holds to stretch R side and decrease back pain Seated edge of mat: hamstring stretch x 30 sec holds x 2 reps bilaterally Piriformis stretch x 30 sec holds bilaterally x 2 Stepping over hurdles x 8 with occasional inability to clear hurdles- pt felt challenged by this especially while talking and walking Forward step ups x 10 reps bilaterally with pt with occasional loss of balance and need for CGA Lateral steps ups x 10 reps bilaterally with occasional loss of balance that required min assist to correct and CGA- minA to Assessed bilateral lateral trunk lymphedema and her L is still considerably swollen- assessed compression bra and it was ill fitting so suggested wearing the [employer] bra instead - educated pt to discuss swelling with her surgeon at her appt on 10/21  12/13/22 Nustep level 5 x 10 min - seat at 8 no UEs with v/c to keep knees from adducting, 540 steps 3 way hip machine:  40 lbs x 10  reps bilaterally in to flexion, abduction, and extension with pt feeling challenged by this Leg press x 70 lbs x 20 reps with seat at 5 Heel/toe walking on air ex beam x 6 initially with 1 HHA then down to finger tips then to occasional finger touch on bars for support Sidestepping down airex balance beam x 4 reps with occasional finger tip touch on bars  Forward step ups x 10 reps bilaterally with pt feeling challenged by this with occasional finger touch on // bars Lateral steps ups x 10 reps bilaterally with occasional finger touch on bars with increased challenge with this Backwards step ups x 10 with 1 HHA on // bars with increased difficulty and need for assist to get her foot on step  12/07/22 Nustep level 5 x 10 min - seat at 8 no UEs with v/c to keep knees from adducting, 490 steps 3 way hip machine: initially started with 10 reps of extension at 25 lbs but this was too easy so increased resistance to 40 lbs x 10 reps bilaterally in to flexion, abduction, and extension with pt feeling challenged by this IT band rolling bilaterally x 5 min Seated hamstring stretch x 30 sec holds x 2 reps bilaterally Quad stretch in // bars x 60 sec holds bilaterally Heel/toe walking on air ex beam x 4 initially with 2 HHA then down to finger  tips Standing on 1 leg on air ex with finger tip assist on // bars with pt able to maintain about 1-3 sec holds Staining on air - 3 way hip with no resistance x 10 reps each with fingertip to HHA to challenge balance   12/05/22: Pulleys x 2 min in direction of flexion and 2 min in direction of abduction Discussed importance of doing supine scapular strengtheing exercises in supine for now due to compensation patterns in standing Educated pt in lymphedema risk reduction practices and issued handouts  11/30/22: Therapeutic Exercises Pulleys into flexion x 2 mins and then in to abduction x 2 min Ball flexion x 10 on wall with v/c for proper form, abduction x 10  bilaterally with v/c for proper form Instructed pt in supine scapular strengthening exercises with yellow band x 10 reps each with pt returning therapist demo as follows: narrow and wide grip flexion, ER, horizontal abduction, and diagonals - pt did not have any increased pain with these - issued these as part of her HEP Manual Therapy P/ROM to bil shoulders into flexion, abd and ER to pts tolerance with therapist able to achieve full PROM MFR to Lt UE at area of cording from axilla to medial upper arm with numerous cords palpable MLD: Rt inguinal nodes, Rt axillo-inguinal anastomosis and then focused on edema inferior to Rt mastectomy   11/28/22: Therapeutic Exercises Pulleys into flexion x 2 mins and then in to abduction x 2 min Ball flexion x 10 on wall with v/c for proper form, abduction x 10 bilaterally with v/c for proper form Manual Therapy P/ROM to bil shoulders into flexion, abd and ER to pts tolerance with therapist able to achieve full PROM MFR to Lt UE at area of cording from axilla to medial upper arm with numerous cords palpable MLD: Rt inguinal nodes, Rt axillo-inguinal anastomosis and then focused on edema inferior to Rt mastectomy instructing pt in this and having her return demo then repeated on L side which appears more swollen today Edema management: cut 1/2 grey foam and placed in small stockinette to wear across bilateral chest to decrease post op edema  11/21/22: Manual Therapy P/ROM to bil shoulders into flexion, abd and D2 to pts tolerance and with scapular depression by therapist throughout MFR to Lt UE at area of cording from axilla to medial upper arm MLD: Rt inguinal nodes, Rt axillo-inguinal anastomosis and then focused on edema inferior to Rt mastectomy instructing pt in this and having her return demo Therapeutic Exercises Pulleys into flex x 2:30 mins with VC's during to decrease scapular compensation  PATIENT EDUCATION:  Education details: HEP Person  educated: Patient Education method: Medical illustrator Education comprehension: verbalized understanding and returned demonstration  HOME EXERCISE PROGRAM: Reviewed previously given post op HEP. SLS and tandem stance at counter  Bridging SI joint stabilization in to isometric abduction/adduction  ASSESSMENT:  CLINICAL IMPRESSION: Pt reports she got more sleep over the last few days and is feeling less fatigued. Pt did nearly 100 steps more on the NuStep in the same time frame as last visit. She was able to tolerate increased reps on the hip machine as well as increased resistance and increased resistance on the leg press. Continued to progress high level balance exercises and pt is demonstrating improvements but is still very challenged by soft surfaces and 1 leg stance.   Pt will benefit from skilled therapeutic intervention to improve on the following deficits: Decreased knowledge of precautions, impaired UE functional  use, pain, decreased ROM, postural dysfunction.   PT treatment/interventions: ADL/Self care home management, Therapeutic exercises, Therapeutic activity, Patient/Family education, Self Care, Manual lymph drainage, scar mobilization, Manual therapy, and Re-evaluation   GOALS: Goals reviewed with patient? Yes  LONG TERM GOALS:  (STG=LTG)  GOALS Name Target Date  Goal status  1 Pt will demonstrate she has regained full shoulder ROM and function post operatively compared to baselines.  Baseline: 12/12/2022 MET  12/05/22  2 Patient will report >/= 50% less pain and sensitivity in her chest midline to tolerate daily tasks with greater ease 12/12/2022 MET 12/05/22 at least 50% improved  3 Patient will improve her DASH score to be back to baseline (4.55) for improved overall UE function. 12/12/2022 MET 01/05/23: 4.55  4 Patient will report good understanding of lymphedema risk reduction practices. 12/12/2022 ONGOING 01/05/23: still requires some cuieng 12/05/22- re  eductated pt and issued handouts  5 Pt will receive a compression sleeve to wear when she flies for prophylactic use. 01/02/23 MET 01/05/23  6 Pt will be able to complete 12 sit to stands in 30 sec wihtout UEs to reduce fall risk. 01/02/23 ONGOING 01/05/23: 10 reps   7 Pt will improve BERG balance score to 50 to decrease risk of falls. 01/02/23 MET 01/05/23 - 51  8 Pt will be able to complete SLS for 20 sec to decrease fall risk. 02/02/23 NEW     PLAN:  PT FREQUENCY/DURATION: 2x/week for 4 weeks  PLAN FOR NEXT SESSION:  continue hurdles and add foam pads as well as balance exercises, measure for compression sleeve, begin high level balance and update HEP,  begin scar massage when fully healed (2-3 more weeks ~9/30), MLD as needed   [facility]  [address]  [place] [address]  [phone]    Milagros Loll [place], PT 01/12/2023, 1:57 PM

## 2023-01-13 ENCOUNTER — Encounter: Payer: Self-pay | Admitting: Internal Medicine

## 2023-01-13 NOTE — Telephone Encounter (Signed)
Updated patient's Immunization list with [facility].  Forwarded to Dr. Chales Abrahams.

## 2023-01-16 ENCOUNTER — Encounter: Payer: Medicare Other | Admitting: Physical Therapy

## 2023-01-16 ENCOUNTER — Ambulatory Visit: Payer: Medicare Other

## 2023-01-17 ENCOUNTER — Ambulatory Visit: Payer: Medicare Other

## 2023-01-17 VITALS — Wt 152.2 lb

## 2023-01-17 DIAGNOSIS — M6281 Muscle weakness (generalized): Secondary | ICD-10-CM | POA: Diagnosis not present

## 2023-01-17 DIAGNOSIS — R293 Abnormal posture: Secondary | ICD-10-CM

## 2023-01-17 DIAGNOSIS — Z171 Estrogen receptor negative status [ER-]: Secondary | ICD-10-CM

## 2023-01-17 DIAGNOSIS — Z483 Aftercare following surgery for neoplasm: Secondary | ICD-10-CM | POA: Diagnosis not present

## 2023-01-17 DIAGNOSIS — M25612 Stiffness of left shoulder, not elsewhere classified: Secondary | ICD-10-CM

## 2023-01-17 DIAGNOSIS — R262 Difficulty in walking, not elsewhere classified: Secondary | ICD-10-CM

## 2023-01-17 DIAGNOSIS — C50412 Malignant neoplasm of upper-outer quadrant of left female breast: Secondary | ICD-10-CM

## 2023-01-17 DIAGNOSIS — M25611 Stiffness of right shoulder, not elsewhere classified: Secondary | ICD-10-CM | POA: Diagnosis not present

## 2023-01-17 NOTE — Therapy (Signed)
OUTPATIENT PHYSICAL THERAPY BREAST CANCER TREATMENT   Patient Name: Jessica [name] MRN: [id number] DOB:1941-01-05, 82 y.o., female Today's Date: 01/17/2023  END OF SESSION:  PT End of Session - 01/17/23 1307     Visit Number 16    Number of Visits 21    Date for PT Re-Evaluation 02/02/23    PT Start Time 1305    PT Stop Time 1350    PT Time Calculation (min) 45 min    Activity Tolerance Patient tolerated treatment well    Behavior During Therapy [facility] for tasks assessed/performed                 Past Medical History:  Diagnosis Date   Anemia 12/2015   Bronchitis, chronic (HCC)    in her 69s   Cataract    Chronic kidney disease    in 20s had nephritis   Closed fracture of right distal humerus    right   Coronary artery disease    Hand, foot and mouth disease (HFMD)    Hearing loss 05/03/2016   High cholesterol    Hypothyroidism    Myocardial infarction (HCC)    Osteopenia    Pericarditis 01/1999   Pneumothorax 01/1999   Thyroid disorder    Vitamin B 12 deficiency    Past Surgical History:  Procedure Laterality Date   birthmark      removed,left leg   BREAST BIOPSY Left 09/13/2022   [place] LT BREAST BX W LOC DEV 1ST LESION IMG BX SPEC US GUIDE 09/13/2022 [facility]AMMO[facility]HY   CARDIAC CATHETERIZATION     CATARACT EXTRACTION  2017   CORONARY STENT INTERVENTION N/A 09/01/2020   Procedure: CORONARY STENT INTERVENTION;  Surgeon: Lyn Records, MD;  Location: [facility];  Service: Cardiovascular;  Laterality: N/A;   LEFT HEART CATH AND CORONARY ANGIOGRAPHY N/A 09/01/2020   Procedure: LEFT HEART CATH AND CORONARY ANGIOGRAPHY;  Surgeon: Lyn Records, MD;  Location: [facility];  Service: Cardiovascular;  Laterality: N/A;   ORIF HUMERUS FRACTURE Right 04/10/2019   Procedure: RIGHT OPEN REDUCTION INTERNAL FIXATION (ORIF) DISTAL HUMERUS FRACTURE WITH EXTENSION;  Surgeon: Bjorn Pippin, MD;  Location: [facility];  Service: Orthopedics;   Laterality: Right;   PORTACATH PLACEMENT N/A 11/15/2022   Procedure: PORT PLACEMENT WITH ULTRASOUND GUIDANCE;  Surgeon: Harriette Bouillon, MD;  Location: [facility];  Service: General;  Laterality: N/A;   SIMPLE MASTECTOMY WITH AXILLARY SENTINEL NODE BIOPSY Bilateral 10/18/2022   Procedure: BILATERAL SIMPLE MASTECTOMY;  Surgeon: Harriette Bouillon, MD;  Location: [facility];  Service: General;  Laterality: Bilateral;  PEC BLOCK   TONSILLECTOMY AND ADENOIDECTOMY  1952?   Patient Active Problem List   Diagnosis Date Noted   Malignant neoplasm of upper-outer quadrant of left breast in female, estrogen receptor negative (HCC) 09/21/2022   Atherosclerotic heart disease of native coronary artery with other forms of angina pectoris (HCC) 04/08/2022   Swelling of left foot 04/07/2022   Left hand fracture, sequela 04/07/2022   Depression with anxiety 04/07/2022   NSTEMI (non-ST elevated myocardial infarction) (HCC) 09/01/2020   Closed fracture of right distal humerus 04/10/2019   Dyspnea 05/09/2018   Vitamin B 12 deficiency 05/09/2018   Cough 03/06/2018   Senile osteoporosis 11/20/2017   Iron deficiency 04/26/2017   Lichen sclerosus 04/26/2017   External hemorrhoid 04/26/2017   Weakness of left leg 06/07/2016   Balance disorder 05/24/2016   Memory deficit 05/24/2016   Anemia 05/19/2016  Hearing loss 05/03/2016   Hypothyroidism 07/24/2013   Bradycardia on ECG 07/24/2013   Numbness 08/07/2012   HYPERLIPIDEMIA 12/29/2006    REFERRING PROVIDER: Dr. Harriette Bouillon  REFERRING DIAG: Left breast cancer  THERAPY DIAG:  Difficulty in walking, not elsewhere classified  Muscle weakness (generalized)  Stiffness of left shoulder, not elsewhere classified  Stiffness of right shoulder, not elsewhere classified  Aftercare following surgery for neoplasm  Abnormal posture  Malignant neoplasm of upper-outer quadrant of left breast in female, estrogen receptor negative (HCC)  Rationale for  Evaluation and Treatment: Rehabilitation  ONSET DATE: 10/18/2022  SUBJECTIVE:                                                                                                                                                                                           SUBJECTIVE STATEMENT: My stamina is the slowest thing to come back I'm noticing. But I am getting back to my exercises which feels great. I was able to resume my pool exercises at [facility]. I just started those today and it makes me feel so good, I love being able to do that.   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 She underwent a bilateral mastectomy and left sentinel node biopsy (7 negative nodes) on 10/18/2022. It is triple negative with a Ki67 of 5%. She has some cardiac history and had an elbow fracture in 2022 resulting in surgery where pins were placed.   PATIENT GOALS:  Reassess how my recovery is going related to arm function, pain, and swelling.  PAIN:  Are you having pain? No   PRECAUTIONS: Recent Surgery, left UE Lymphedema risk  RED FLAGS: None   ACTIVITY LEVEL / LEISURE: She is walking half to 1 mile daily   OBJECTIVE:   PATIENT SURVEYS:  QUICK DASH:      OBSERVATIONS: Bilateral incisions appear to be healing well. There is skin overlap and edema present with tissue on the lateral aspects on her chest.  POSTURE:  Forward head and rounded shoulders   LYMPHEDEMA ASSESSMENT:   UPPER EXTREMITY AROM/PROM:   A/PROM RIGHT   eval   RIGHT 11/14/2022  Shoulder extension 63 60  Shoulder flexion 128 110  Shoulder abduction 137 136  Shoulder internal rotation 68 75  Shoulder external rotation 73 75                          (Blank rows = not tested)   A/PROM LEFT   eval LEFT 11/14/2022 LET  12/05/22  Shoulder extension 53 49  Shoulder flexion 135 109 154  Shoulder abduction 146 114 161  Shoulder internal rotation 74 77    Shoulder external rotation 78 70                           (Blank rows = not tested)     UPPER EXTREMITY STRENGTH: WFL  LOWER EXTREMITY STRENGTH:   MMT Right eval  Hip flexion 5/5  Hip extension 4/5  Hip abduction 4/5  Hip adduction   Hip internal rotation   Hip external rotation   Knee flexion 5/5  Knee extension 5/5  Ankle dorsiflexion 5/5  Ankle plantarflexion   Ankle inversion   Ankle eversion    (Blank rows = not tested)  A/PROM LEFT eval  Hip flexion 5/5  Hip extension 3/5  Hip abduction 4+/5  Hip adduction   Hip internal rotation   Hip external rotation   Knee flexion 5/5  Knee extension 5/5  Ankle dorsiflexion 5/5  Ankle plantarflexion   Ankle inversion   Ankle eversion    (Blank rows = not tested)  FUNCTIONAL TEST:  30 SEC SIT TO STAND: 9 reps which is below average for her age  BERG Balance Test: 44/56, anything less than 45 is a greater risk of falling  LYMPHEDEMA ASSESSMENTS: NOT ASSESSED AS PT IS NOT HAVING ANY LYMPH NODES REMOVED   LANDMARK RIGHT   eval RIGHT 11/14/2022  10 cm proximal to olecranon process  Not entered due pt was not originally having nodes removed 27.6  Olecranon process   24.3  10 cm proximal to ulnar styloid process   20.1  Just proximal to ulnar styloid process   15.1  Across hand at thumb web space   16.7  At base of 2nd digit   6.1  (Blank rows = not tested)   LANDMARK LEFT   eval LEFT 11/14/2022  10 cm proximal to olecranon process   Not entered due pt was not originally having nodes removed 26.4  Olecranon process   23.4  10 cm proximal to ulnar styloid process   19.5  Just proximal to ulnar styloid process   13.9  Across hand at thumb web space   16.4  At base of 2nd digit   6.1  (Blank rows = not tested) Surgery type/Date: 10/18/2022 Number of lymph nodes removed: 7 Current/past treatment (chemo, radiation, hormone therapy): none Other symptoms:  Heaviness/tightness No Pain Yes Pitting edema No Infections  No Decreased scar mobility Yes Stemmer sign No   TODAY'S TREATMENT: Date: 01/17/23:  Therapeutic Exercises Nustep level 4 x 10 min - seat at 8 no UE's, 582 steps SOZO redone, pt WNLs and answered her questions about this Bil leg press 100 lbs, 3 x 10 3 way hip machine 40# x 10 each with VC's throughout for technique In // bars for stepping over hurdles in forward direction x 8 knocked over hurdle ~3x; on blue oval for hip 3 way raises x 10 each direction with fingertip to no support throughout Forearms on wall with yellow theraband for wall walking x 3, then scap retract x 5, then with back against wall for bil UE snow angels x 3 returning therapist demo for each (pt asked for exercises for posture so she will do these at home) In // bars for mini squats on purple foam x 10 with cuing to keep chest up Seated for bil HS and piriformis figure 4 stretch x 2 , 20-30 sec  holds each reviewing with pt importance of incorporating stretches into her day, especially as she gets back into her ex routine  01/12/23 Nustep level 4 x 10 min - seat at 8 no Ues3, 680 steps Leg press x 100 lbs x 20 reps 3 way hip machine x 20 reps each 45# with therapist provided v/c for positioning Seated hamstring stretch x 2 reps x 60 sec holds - R side more tight Hurdles in // bars in forward direction x 8 with only 1 time where hurdles were knocked over, sidestepping at 6 with no losses of balance and backwards x 2 with increased difficulty with this Standing on foam - 3 way hip x 10 reps in each direction with no HHA on // bars but close SBA for safety Hurdles in // bars in forward direction x 8 with only 1 time where hurdles were knocked over, sidestepping at 6 with no losses of balance and backwards x 2 with pt feeling challenged but no loss of balance 10 mini squats with pt returning therapist demo  01/10/23 Nustep level 4 x 10 min - seat at 8 no Ues3, 537 steps Leg press x 90 lbs x 20 reps 3 way hip machine x 10  reps each 40# with therapist provided v/c for positioning Seated hamstring stretch x 2 reps x 60 sec holds - R side more tight Seated piriformis x 2 reps x 60 sec holds  Hurdles in // bars in forward direction x 8 with only 1 time where hurdles were knocked over, sidestepping at 6 with no losses of balance and backwards x 2 with increased difficulty in this direction Supine over half foam roll with arms outstretched x 3 min for pec stretch to decrease tightness Heel/toe on airex x 8 reps with occasional finger touch assist on // bars  Colored circles with cones on top with pt in middle - said a color name and pt had to tap that color with her foot - she had increased difficulty stepping to the R and backwards   01/05/23 Nustep level 4 x 10 min - seat at 8 no Ues3, 545 steps Reassessed berg balance and 30 sec sit to stand Reassessed goals Leg press x 85 lbs x 20 reps 3 way hip machine x 20 reps each 45# with pt returning therapist demo Heel/toe on airex x 8 reps with occasional finger touch assist on // bars then sidestepping on airex x 4 with pt reporting this was less challenging  01/02/23 Nustep level 3 x 10 min - seat at 8 no UEs with v/c to keep knees from adducting, 579 steps Leg press x 80 lbs x 20 reps 3 way hip machine x 20 reps each 45# with pt returning therapist demo Supine bridging x 10 reps with v/c to keep knees hip width apart  Pelvic tilts x 10 reps with v/c for correct form Pelvic tilts and mini marches x 10 reps each with v/c to move slowly and controlled Pelvic tilts and mini hip abd in hooklying x 10 reps with v/c to keep core engaged to avoid movement at lower trunk Stepping over hurdles in // bars with 1 cobblestone square in between 2 of the hurdles and an airex pad in between 2 of the hurdles with occasional min A and CGA throughout x 6 reps going forward, 4 reps going side to side; then removed the air ex and cobble stone and pt did much better and added x 2 backwards  for this -  pt found cobblestone and airex challenging and she lost balance 2x and required min A to correct  12/26/22 Nustep level 4 x 11 min - seat at 8 no UEs with v/c to keep knees from adducting, 546 steps Leg press x 80 lbs x 20 reps 3 way hip machine x 15 reps each 45# with pt returning therapist demo Supine bridging x 10 reps with v/c to keep knees hip width apart  Pelvic tilts x 10 reps with v/c for correct form Pelvic tilts and mini marches x 10 reps each with v/c to move slowly and controlled Pelvic tilts and mini hip abd in hooklying x 10 reps with v/c to keep core engaged to avoid movement at lower trunk Measured pt for a compression sleeve for prophylactic use and assisted her with ordering from compression guru - [employer] S1 beige 20-47mmHg Stepping over hurdles in // bars x 10 reps with pt initially able to clear hurdles and when she became distracted she did not clear them - demo to pt how to step over them intentionally and pt was able to return demo without touching the hurdles; then repeated going side to side x 6, then backwards x 1    12/21/22 Sidelying on L side: STM to R lower back/SI joint area with increased muscle tightness palpable, then use suction cup with cocoa butter moivng in all directions while also pulling upwards to improve blood flow and decrease tightness Supine bridging x 20 reps with v/c to keep knees hip width apart - pt reported decreased discomfort after this Supine in hook lying with folded pillow between knees and gait belt around thighs alternating between isometric hip abduction and adduction with 5 sec holds x 5 reps each  Nustep level 2 x 10 min - seat at 8 no UEs with v/c to keep knees from adducting, 512 steps  12/19/22 Nustep level 3 x 10 min - seat at 8 no UEs with v/c to keep knees from adducting, 540 steps 3 way hip machine:  45 lbs x 10 reps bilaterally in to flexion, abduction, and extension with pt feeling challenged by this Leg  press x 70 lbs x 20 reps with seat at 5 Supine knee to chest stretch x 15 sec hold x 3 Supine trunk rotation stretch x 30 sec holds to stretch R side and decrease back pain Seated edge of mat: hamstring stretch x 30 sec holds x 2 reps bilaterally Piriformis stretch x 30 sec holds bilaterally x 2 Stepping over hurdles x 8 with occasional inability to clear hurdles- pt felt challenged by this especially while talking and walking Forward step ups x 10 reps bilaterally with pt with occasional loss of balance and need for CGA Lateral steps ups x 10 reps bilaterally with occasional loss of balance that required min assist to correct and CGA- minA to Assessed bilateral lateral trunk lymphedema and her L is still considerably swollen- assessed compression bra and it was ill fitting so suggested wearing the [employer] bra instead - educated pt to discuss swelling with her surgeon at her appt on 10/21  12/13/22 Nustep level 5 x 10 min - seat at 8 no UEs with v/c to keep knees from adducting, 540 steps 3 way hip machine:  40 lbs x 10 reps bilaterally in to flexion, abduction, and extension with pt feeling challenged by this Leg press x 70 lbs x 20 reps with seat at 5 Heel/toe walking on air ex beam x 6 initially with  1 HHA then down to finger tips then to occasional finger touch on bars for support Sidestepping down airex balance beam x 4 reps with occasional finger tip touch on bars  Forward step ups x 10 reps bilaterally with pt feeling challenged by this with occasional finger touch on // bars Lateral steps ups x 10 reps bilaterally with occasional finger touch on bars with increased challenge with this Backwards step ups x 10 with 1 HHA on // bars with increased difficulty and need for assist to get her foot on step  12/07/22 Nustep level 5 x 10 min - seat at 8 no UEs with v/c to keep knees from adducting, 490 steps 3 way hip machine: initially started with 10 reps of extension at 25 lbs but this  was too easy so increased resistance to 40 lbs x 10 reps bilaterally in to flexion, abduction, and extension with pt feeling challenged by this IT band rolling bilaterally x 5 min Seated hamstring stretch x 30 sec holds x 2 reps bilaterally Quad stretch in // bars x 60 sec holds bilaterally Heel/toe walking on air ex beam x 4 initially with 2 HHA then down to finger tips Standing on 1 leg on air ex with finger tip assist on // bars with pt able to maintain about 1-3 sec holds Staining on air - 3 way hip with no resistance x 10 reps each with fingertip to HHA to challenge balance   12/05/22: Pulleys x 2 min in direction of flexion and 2 min in direction of abduction Discussed importance of doing supine scapular strengtheing exercises in supine for now due to compensation patterns in standing Educated pt in lymphedema risk reduction practices and issued handouts  11/30/22: Therapeutic Exercises Pulleys into flexion x 2 mins and then in to abduction x 2 min Ball flexion x 10 on wall with v/c for proper form, abduction x 10 bilaterally with v/c for proper form Instructed pt in supine scapular strengthening exercises with yellow band x 10 reps each with pt returning therapist demo as follows: narrow and wide grip flexion, ER, horizontal abduction, and diagonals - pt did not have any increased pain with these - issued these as part of her HEP Manual Therapy P/ROM to bil shoulders into flexion, abd and ER to pts tolerance with therapist able to achieve full PROM MFR to Lt UE at area of cording from axilla to medial upper arm with numerous cords palpable MLD: Rt inguinal nodes, Rt axillo-inguinal anastomosis and then focused on edema inferior to Rt mastectomy   11/28/22: Therapeutic Exercises Pulleys into flexion x 2 mins and then in to abduction x 2 min Ball flexion x 10 on wall with v/c for proper form, abduction x 10 bilaterally with v/c for proper form Manual Therapy P/ROM to bil shoulders into  flexion, abd and ER to pts tolerance with therapist able to achieve full PROM MFR to Lt UE at area of cording from axilla to medial upper arm with numerous cords palpable MLD: Rt inguinal nodes, Rt axillo-inguinal anastomosis and then focused on edema inferior to Rt mastectomy instructing pt in this and having her return demo then repeated on L side which appears more swollen today Edema management: cut 1/2 grey foam and placed in small stockinette to wear across bilateral chest to decrease post op edema  11/21/22: Manual Therapy P/ROM to bil shoulders into flexion, abd and D2 to pts tolerance and with scapular depression by therapist throughout MFR to Lt UE at area  of cording from axilla to medial upper arm MLD: Rt inguinal nodes, Rt axillo-inguinal anastomosis and then focused on edema inferior to Rt mastectomy instructing pt in this and having her return demo Therapeutic Exercises Pulleys into flex x 2:30 mins with VC's during to decrease scapular compensation  PATIENT EDUCATION:  Education details: HEP Person educated: Patient Education method: Medical illustrator Education comprehension: verbalized understanding and returned demonstration  HOME EXERCISE PROGRAM: Reviewed previously given post op HEP. SLS and tandem stance at counter  Bridging SI joint stabilization in to isometric abduction/adduction 01/17/23 - Access Code: [email or web address] URL: [email or web address] Date: 01/19/2023 Prepared by: Berna Spare  Exercises - Forearm Walks on Wall with Resistance Band  - 1 x daily - 3-4 x weekly - 10 reps - Serratus Forearm Push Up Plus at Wall with Elbows  - 1 x daily - 3-4 x weekly - 10 reps  ASSESSMENT:  CLINICAL IMPRESSION: Pt came in reporting some LE fatigue as she was able to resume her pool exercises at [facility] this morning. She was did less steps on the NuStep due to LE fatige but was able to increase reps with leg press. She asked for  postural exercises to help with erect posture so instructed her in forearms on wall with yellow theraband as described above.   Pt will benefit from skilled therapeutic intervention to improve on the following deficits: Decreased knowledge of precautions, impaired UE functional use, pain, decreased ROM, postural dysfunction.   PT treatment/interventions: ADL/Self care home management, Therapeutic exercises, Therapeutic activity, Patient/Family education, Self Care, Manual lymph drainage, scar mobilization, Manual therapy, and Re-evaluation   GOALS: Goals reviewed with patient? Yes  LONG TERM GOALS:  (STG=LTG)  GOALS Name Target Date  Goal status  1 Pt will demonstrate she has regained full shoulder ROM and function post operatively compared to baselines.  Baseline: 12/12/2022 MET  12/05/22  2 Patient will report >/= 50% less pain and sensitivity in her chest midline to tolerate daily tasks with greater ease 12/12/2022 MET 12/05/22 at least 50% improved  3 Patient will improve her DASH score to be back to baseline (4.55) for improved overall UE function. 12/12/2022 MET 01/05/23: 4.55  4 Patient will report good understanding of lymphedema risk reduction practices. 12/12/2022 ONGOING 01/05/23: still requires some cuieng 12/05/22- re eductated pt and issued handouts  5 Pt will receive a compression sleeve to wear when she flies for prophylactic use. 01/02/23 MET 01/05/23  6 Pt will be able to complete 12 sit to stands in 30 sec wihtout UEs to reduce fall risk. 01/02/23 ONGOING 01/05/23: 10 reps   7 Pt will improve BERG balance score to 50 to decrease risk of falls. 01/02/23 MET 01/05/23 - 51  8 Pt will be able to complete SLS for 20 sec to decrease fall risk. 02/02/23 NEW     PLAN:  PT FREQUENCY/DURATION: 2x/week for 4 weeks  PLAN FOR NEXT SESSION:  continue hurdles and add foam pads as well as balance exercises, measure for compression sleeve, begin high level balance and update HEP,  begin  scar massage when fully healed (2-3 more weeks ~9/30), MLD as needed   [facility]  [address], [address]  [place] [address]  [phone]    Hermenia Bers, PTA 01/17/2023, 1:54 PM

## 2023-01-18 ENCOUNTER — Encounter: Payer: Self-pay | Admitting: Physical Therapy

## 2023-01-19 ENCOUNTER — Encounter: Payer: Medicare Other | Admitting: Physical Therapy

## 2023-01-19 ENCOUNTER — Encounter: Payer: Medicare Other | Admitting: Rehabilitation

## 2023-01-24 ENCOUNTER — Encounter: Payer: Self-pay | Admitting: Physical Therapy

## 2023-01-24 ENCOUNTER — Ambulatory Visit: Payer: Medicare Other | Admitting: Physical Therapy

## 2023-01-24 DIAGNOSIS — M6281 Muscle weakness (generalized): Secondary | ICD-10-CM | POA: Diagnosis not present

## 2023-01-24 DIAGNOSIS — Z483 Aftercare following surgery for neoplasm: Secondary | ICD-10-CM | POA: Diagnosis not present

## 2023-01-24 DIAGNOSIS — R262 Difficulty in walking, not elsewhere classified: Secondary | ICD-10-CM | POA: Diagnosis not present

## 2023-01-24 DIAGNOSIS — M25612 Stiffness of left shoulder, not elsewhere classified: Secondary | ICD-10-CM | POA: Diagnosis not present

## 2023-01-24 DIAGNOSIS — R293 Abnormal posture: Secondary | ICD-10-CM

## 2023-01-24 DIAGNOSIS — M25611 Stiffness of right shoulder, not elsewhere classified: Secondary | ICD-10-CM

## 2023-01-24 DIAGNOSIS — C50412 Malignant neoplasm of upper-outer quadrant of left female breast: Secondary | ICD-10-CM

## 2023-01-24 NOTE — Therapy (Signed)
OUTPATIENT PHYSICAL THERAPY BREAST CANCER TREATMENT   Patient Name: Jessica [name] MRN: [id number] DOB:04/13/1940, 82 y.o., female Today's Date: 01/24/2023  END OF SESSION:  PT End of Session - 01/24/23 1406     Visit Number 17    Number of Visits 21    Date for PT Re-Evaluation 02/02/23    PT Start Time 1403    PT Stop Time 1457    PT Time Calculation (min) 54 min    Activity Tolerance Patient tolerated treatment well    Behavior During Therapy [facility] for tasks assessed/performed                 Past Medical History:  Diagnosis Date   Anemia 12/2015   Bronchitis, chronic (HCC)    in her 20s   Cataract    Chronic kidney disease    in 20s had nephritis   Closed fracture of right distal humerus    right   Coronary artery disease    Hand, foot and mouth disease (HFMD)    Hearing loss 05/03/2016   High cholesterol    Hypothyroidism    Myocardial infarction (HCC)    Osteopenia    Pericarditis 01/1999   Pneumothorax 01/1999   Thyroid disorder    Vitamin B 12 deficiency    Past Surgical History:  Procedure Laterality Date   birthmark      removed,left leg   BREAST BIOPSY Left 09/13/2022   [place] LT BREAST BX W LOC DEV 1ST LESION IMG BX SPEC US GUIDE 09/13/2022 [facility] MAMMOGRAPHY   CARDIAC CATHETERIZATION     CATARACT EXTRACTION  2017   CORONARY STENT INTERVENTION N/A 09/01/2020   Procedure: CORONARY STENT INTERVENTION;  Surgeon: Lyn Records, MD;  Location: [facility];  Service: Cardiovascular;  Laterality: N/A;   LEFT HEART CATH AND CORONARY ANGIOGRAPHY N/A 09/01/2020   Procedure: LEFT HEART CATH AND CORONARY ANGIOGRAPHY;  Surgeon: Lyn Records, MD;  Location: [facility];  Service: Cardiovascular;  Laterality: N/A;   ORIF HUMERUS FRACTURE Right 04/10/2019   Procedure: RIGHT OPEN REDUCTION INTERNAL FIXATION (ORIF) DISTAL HUMERUS FRACTURE WITH EXTENSION;  Surgeon: Bjorn Pippin, MD;  Location: [facility];  Service: Orthopedics;   Laterality: Right;   PORTACATH PLACEMENT N/A 11/15/2022   Procedure: PORT PLACEMENT WITH ULTRASOUND GUIDANCE;  Surgeon: Harriette Bouillon, MD;  Location: [facility];  Service: General;  Laterality: N/A;   SIMPLE MASTECTOMY WITH AXILLARY SENTINEL NODE BIOPSY Bilateral 10/18/2022   Procedure: BILATERAL SIMPLE MASTECTOMY;  Surgeon: Harriette Bouillon, MD;  Location: [facility];  Service: General;  Laterality: Bilateral;  PEC BLOCK   TONSILLECTOMY AND ADENOIDECTOMY  1952?   Patient Active Problem List   Diagnosis Date Noted   Malignant neoplasm of upper-outer quadrant of left breast in female, estrogen receptor negative (HCC) 09/21/2022   Atherosclerotic heart disease of native coronary artery with other forms of angina pectoris (HCC) 04/08/2022   Swelling of left foot 04/07/2022   Left hand fracture, sequela 04/07/2022   Depression with anxiety 04/07/2022   NSTEMI (non-ST elevated myocardial infarction) (HCC) 09/01/2020   Closed fracture of right distal humerus 04/10/2019   Dyspnea 05/09/2018   Vitamin B 12 deficiency 05/09/2018   Cough 03/06/2018   Senile osteoporosis 11/20/2017   Iron deficiency 04/26/2017   Lichen sclerosus 04/26/2017   External hemorrhoid 04/26/2017   Weakness of left leg 06/07/2016   Balance disorder 05/24/2016   Memory deficit 05/24/2016   Anemia 05/19/2016  Hearing loss 05/03/2016   Hypothyroidism 07/24/2013   Bradycardia on ECG 07/24/2013   Numbness 08/07/2012   HYPERLIPIDEMIA 12/29/2006    REFERRING PROVIDER: Dr. Harriette Bouillon  REFERRING DIAG: Left breast cancer  THERAPY DIAG:  Difficulty in walking, not elsewhere classified  Muscle weakness (generalized)  Stiffness of left shoulder, not elsewhere classified  Stiffness of right shoulder, not elsewhere classified  Aftercare following surgery for neoplasm  Abnormal posture  Malignant neoplasm of upper-outer quadrant of left breast in female, estrogen receptor negative (HCC)  Rationale for  Evaluation and Treatment: Rehabilitation  ONSET DATE: 10/18/2022  SUBJECTIVE:                                                                                                                                                                                           SUBJECTIVE STATEMENT: I was very wobbly after my water aerobics this morning and I laid down and took a nap and I just got up. I bumped my knee and it is swollen and bruised.   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 She underwent a bilateral mastectomy and left sentinel node biopsy (7 negative nodes) on 10/18/2022. It is triple negative with a Ki67 of 5%. She has some cardiac history and had an elbow fracture in 2022 resulting in surgery where pins were placed.   PATIENT GOALS:  Reassess how my recovery is going related to arm function, pain, and swelling.  PAIN:  Are you having pain? Yes: NPRS scale: 1-2/10 Pain location: R knee Pain description: sharp when pressed, sensitive Aggravating factors: pressing it Relieving factors: not pressing it/nothing, being still    PRECAUTIONS: Recent Surgery, left UE Lymphedema risk  RED FLAGS: None   ACTIVITY LEVEL / LEISURE: She is walking half to 1 mile daily   OBJECTIVE:   PATIENT SURVEYS:  QUICK DASH:      OBSERVATIONS: Bilateral incisions appear to be healing well. There is skin overlap and edema present with tissue on the lateral aspects on her chest.  POSTURE:  Forward head and rounded shoulders   LYMPHEDEMA ASSESSMENT:   UPPER EXTREMITY AROM/PROM:   A/PROM RIGHT   eval   RIGHT 11/14/2022  Shoulder extension 63 60  Shoulder flexion 128 110  Shoulder abduction 137 136  Shoulder internal rotation 68 75  Shoulder external rotation 73 75                          (Blank rows = not tested)   A/PROM LEFT   eval LEFT 11/14/2022 LET  12/05/22  Shoulder extension 53 49   Shoulder flexion 135  109 154  Shoulder abduction 146 114 161  Shoulder internal rotation 74 77   Shoulder external rotation 78 70                           (Blank rows = not tested)     UPPER EXTREMITY STRENGTH: WFL  LOWER EXTREMITY STRENGTH:   MMT Right eval  Hip flexion 5/5  Hip extension 4/5  Hip abduction 4/5  Hip adduction   Hip internal rotation   Hip external rotation   Knee flexion 5/5  Knee extension 5/5  Ankle dorsiflexion 5/5  Ankle plantarflexion   Ankle inversion   Ankle eversion    (Blank rows = not tested)  A/PROM LEFT eval  Hip flexion 5/5  Hip extension 3/5  Hip abduction 4+/5  Hip adduction   Hip internal rotation   Hip external rotation   Knee flexion 5/5  Knee extension 5/5  Ankle dorsiflexion 5/5  Ankle plantarflexion   Ankle inversion   Ankle eversion    (Blank rows = not tested)  FUNCTIONAL TEST:  30 SEC SIT TO STAND: 9 reps which is below average for her age  BERG Balance Test: 44/56, anything less than 45 is a greater risk of falling  LYMPHEDEMA ASSESSMENTS: NOT ASSESSED AS PT IS NOT HAVING ANY LYMPH NODES REMOVED   LANDMARK RIGHT   eval RIGHT 11/14/2022  10 cm proximal to olecranon process  Not entered due pt was not originally having nodes removed 27.6  Olecranon process   24.3  10 cm proximal to ulnar styloid process   20.1  Just proximal to ulnar styloid process   15.1  Across hand at thumb web space   16.7  At base of 2nd digit   6.1  (Blank rows = not tested)   LANDMARK LEFT   eval LEFT 11/14/2022  10 cm proximal to olecranon process   Not entered due pt was not originally having nodes removed 26.4  Olecranon process   23.4  10 cm proximal to ulnar styloid process   19.5  Just proximal to ulnar styloid process   13.9  Across hand at thumb web space   16.4  At base of 2nd digit   6.1  (Blank rows = not tested) Surgery type/Date: 10/18/2022 Number of lymph nodes removed: 7 Current/past treatment (chemo, radiation, hormone therapy):  none Other symptoms:  Heaviness/tightness No Pain Yes Pitting edema No Infections No Decreased scar mobility Yes Stemmer sign No   TODAY'S TREATMENT: Date: 01/24/23:  Therapeutic Exercises Nustep level 4 x 10 min - seat at 8 no UE's, 611 steps Bil leg press 100 lbs, 3 x 10 3 way hip machine 40# x 10 each with VC's throughout for technique Supine on large mat for snow angels with head not on pillow but chin tucked x 10 reps with increased tightness, unable to go above 90 degrees with hands still touching mat In // bars for stepping over hurdles in forward direction x 8 knocked over hurdle on first attempt but then did not knock over any more when stepping with intention; on blue oval for hip 3 way raises x 10 each direction with fingertip support when standing on left and only occasional fingertip support on R Forearms on wall with yellow theraband for wall walking x 10, then scap retract x 10, then with back against wall for bil UE snow angels x  3 returning therapist demo for each  In // bars for mini squats on purple foam x 10 with cuing to keep chest up Seated for bil HS and piriformis figure 4 stretch x 2 , 20-30 sec   01/17/23:  Therapeutic Exercises Nustep level 4 x 10 min - seat at 8 no UE's, 582 steps SOZO redone, pt WNLs and answered her questions about this Bil leg press 100 lbs, 3 x 10 3 way hip machine 40# x 10 each with VC's throughout for technique In // bars for stepping over hurdles in forward direction x 8 knocked over hurdle ~3x; on blue oval for hip 3 way raises x 10 each direction with fingertip to no support throughout Forearms on wall with yellow theraband for wall walking x 3, then scap retract x 5, then with back against wall for bil UE snow angels x 3 returning therapist demo for each (pt asked for exercises for posture so she will do these at home) In // bars for mini squats on purple foam x 10 with cuing to keep chest up Seated for bil HS and piriformis  figure 4 stretch x 2 , 20-30 sec holds each reviewing with pt importance of incorporating stretches into her day, especially as she gets back into her ex routine  01/12/23 Nustep level 4 x 10 min - seat at 8 no Ues3, 680 steps Leg press x 100 lbs x 20 reps 3 way hip machine x 20 reps each 45# with therapist provided v/c for positioning Seated hamstring stretch x 2 reps x 60 sec holds - R side more tight Hurdles in // bars in forward direction x 8 with only 1 time where hurdles were knocked over, sidestepping at 6 with no losses of balance and backwards x 2 with increased difficulty with this Standing on foam - 3 way hip x 10 reps in each direction with no HHA on // bars but close SBA for safety Hurdles in // bars in forward direction x 8 with only 1 time where hurdles were knocked over, sidestepping at 6 with no losses of balance and backwards x 2 with pt feeling challenged but no loss of balance 10 mini squats with pt returning therapist demo  01/10/23 Nustep level 4 x 10 min - seat at 8 no Ues3, 537 steps Leg press x 90 lbs x 20 reps 3 way hip machine x 10 reps each 40# with therapist provided v/c for positioning Seated hamstring stretch x 2 reps x 60 sec holds - R side more tight Seated piriformis x 2 reps x 60 sec holds  Hurdles in // bars in forward direction x 8 with only 1 time where hurdles were knocked over, sidestepping at 6 with no losses of balance and backwards x 2 with increased difficulty in this direction Supine over half foam roll with arms outstretched x 3 min for pec stretch to decrease tightness Heel/toe on airex x 8 reps with occasional finger touch assist on // bars  Colored circles with cones on top with pt in middle - said a color name and pt had to tap that color with her foot - she had increased difficulty stepping to the R and backwards   01/05/23 Nustep level 4 x 10 min - seat at 8 no Ues3, 545 steps Reassessed berg balance and 30 sec sit to stand Reassessed  goals Leg press x 85 lbs x 20 reps 3 way hip machine x 20 reps each 45# with pt returning therapist  demo Heel/toe on airex x 8 reps with occasional finger touch assist on // bars then sidestepping on airex x 4 with pt reporting this was less challenging  01/02/23 Nustep level 3 x 10 min - seat at 8 no UEs with v/c to keep knees from adducting, 579 steps Leg press x 80 lbs x 20 reps 3 way hip machine x 20 reps each 45# with pt returning therapist demo Supine bridging x 10 reps with v/c to keep knees hip width apart  Pelvic tilts x 10 reps with v/c for correct form Pelvic tilts and mini marches x 10 reps each with v/c to move slowly and controlled Pelvic tilts and mini hip abd in hooklying x 10 reps with v/c to keep core engaged to avoid movement at lower trunk Stepping over hurdles in // bars with 1 cobblestone square in between 2 of the hurdles and an airex pad in between 2 of the hurdles with occasional min A and CGA throughout x 6 reps going forward, 4 reps going side to side; then removed the air ex and cobble stone and pt did much better and added x 2 backwards for this - pt found cobblestone and airex challenging and she lost balance 2x and required min A to correct  12/26/22 Nustep level 4 x 11 min - seat at 8 no UEs with v/c to keep knees from adducting, 546 steps Leg press x 80 lbs x 20 reps 3 way hip machine x 15 reps each 45# with pt returning therapist demo Supine bridging x 10 reps with v/c to keep knees hip width apart  Pelvic tilts x 10 reps with v/c for correct form Pelvic tilts and mini marches x 10 reps each with v/c to move slowly and controlled Pelvic tilts and mini hip abd in hooklying x 10 reps with v/c to keep core engaged to avoid movement at lower trunk Measured pt for a compression sleeve for prophylactic use and assisted her with ordering from compression guru - [employer] S1 beige 20-41mmHg Stepping over hurdles in // bars x 10 reps with pt initially able to  clear hurdles and when she became distracted she did not clear them - demo to pt how to step over them intentionally and pt was able to return demo without touching the hurdles; then repeated going side to side x 6, then backwards x 1    12/21/22 Sidelying on L side: STM to R lower back/SI joint area with increased muscle tightness palpable, then use suction cup with cocoa butter moivng in all directions while also pulling upwards to improve blood flow and decrease tightness Supine bridging x 20 reps with v/c to keep knees hip width apart - pt reported decreased discomfort after this Supine in hook lying with folded pillow between knees and gait belt around thighs alternating between isometric hip abduction and adduction with 5 sec holds x 5 reps each  Nustep level 2 x 10 min - seat at 8 no UEs with v/c to keep knees from adducting, 512 steps  12/19/22 Nustep level 3 x 10 min - seat at 8 no UEs with v/c to keep knees from adducting, 540 steps 3 way hip machine:  45 lbs x 10 reps bilaterally in to flexion, abduction, and extension with pt feeling challenged by this Leg press x 70 lbs x 20 reps with seat at 5 Supine knee to chest stretch x 15 sec hold x 3 Supine trunk rotation stretch x 30 sec holds to  stretch R side and decrease back pain Seated edge of mat: hamstring stretch x 30 sec holds x 2 reps bilaterally Piriformis stretch x 30 sec holds bilaterally x 2 Stepping over hurdles x 8 with occasional inability to clear hurdles- pt felt challenged by this especially while talking and walking Forward step ups x 10 reps bilaterally with pt with occasional loss of balance and need for CGA Lateral steps ups x 10 reps bilaterally with occasional loss of balance that required min assist to correct and CGA- minA to Assessed bilateral lateral trunk lymphedema and her L is still considerably swollen- assessed compression bra and it was ill fitting so suggested wearing the [employer] bra instead -  educated pt to discuss swelling with her surgeon at her appt on 10/21  12/13/22 Nustep level 5 x 10 min - seat at 8 no UEs with v/c to keep knees from adducting, 540 steps 3 way hip machine:  40 lbs x 10 reps bilaterally in to flexion, abduction, and extension with pt feeling challenged by this Leg press x 70 lbs x 20 reps with seat at 5 Heel/toe walking on air ex beam x 6 initially with 1 HHA then down to finger tips then to occasional finger touch on bars for support Sidestepping down airex balance beam x 4 reps with occasional finger tip touch on bars  Forward step ups x 10 reps bilaterally with pt feeling challenged by this with occasional finger touch on // bars Lateral steps ups x 10 reps bilaterally with occasional finger touch on bars with increased challenge with this Backwards step ups x 10 with 1 HHA on // bars with increased difficulty and need for assist to get her foot on step  12/07/22 Nustep level 5 x 10 min - seat at 8 no UEs with v/c to keep knees from adducting, 490 steps 3 way hip machine: initially started with 10 reps of extension at 25 lbs but this was too easy so increased resistance to 40 lbs x 10 reps bilaterally in to flexion, abduction, and extension with pt feeling challenged by this IT band rolling bilaterally x 5 min Seated hamstring stretch x 30 sec holds x 2 reps bilaterally Quad stretch in // bars x 60 sec holds bilaterally Heel/toe walking on air ex beam x 4 initially with 2 HHA then down to finger tips Standing on 1 leg on air ex with finger tip assist on // bars with pt able to maintain about 1-3 sec holds Staining on air - 3 way hip with no resistance x 10 reps each with fingertip to HHA to challenge balance   12/05/22: Pulleys x 2 min in direction of flexion and 2 min in direction of abduction Discussed importance of doing supine scapular strengtheing exercises in supine for now due to compensation patterns in standing Educated pt in lymphedema risk  reduction practices and issued handouts  11/30/22: Therapeutic Exercises Pulleys into flexion x 2 mins and then in to abduction x 2 min Ball flexion x 10 on wall with v/c for proper form, abduction x 10 bilaterally with v/c for proper form Instructed pt in supine scapular strengthening exercises with yellow band x 10 reps each with pt returning therapist demo as follows: narrow and wide grip flexion, ER, horizontal abduction, and diagonals - pt did not have any increased pain with these - issued these as part of her HEP Manual Therapy P/ROM to bil shoulders into flexion, abd and ER to pts tolerance with therapist able  to achieve full PROM MFR to Lt UE at area of cording from axilla to medial upper arm with numerous cords palpable MLD: Rt inguinal nodes, Rt axillo-inguinal anastomosis and then focused on edema inferior to Rt mastectomy   11/28/22: Therapeutic Exercises Pulleys into flexion x 2 mins and then in to abduction x 2 min Ball flexion x 10 on wall with v/c for proper form, abduction x 10 bilaterally with v/c for proper form Manual Therapy P/ROM to bil shoulders into flexion, abd and ER to pts tolerance with therapist able to achieve full PROM MFR to Lt UE at area of cording from axilla to medial upper arm with numerous cords palpable MLD: Rt inguinal nodes, Rt axillo-inguinal anastomosis and then focused on edema inferior to Rt mastectomy instructing pt in this and having her return demo then repeated on L side which appears more swollen today Edema management: cut 1/2 grey foam and placed in small stockinette to wear across bilateral chest to decrease post op edema  11/21/22: Manual Therapy P/ROM to bil shoulders into flexion, abd and D2 to pts tolerance and with scapular depression by therapist throughout MFR to Lt UE at area of cording from axilla to medial upper arm MLD: Rt inguinal nodes, Rt axillo-inguinal anastomosis and then focused on edema inferior to Rt mastectomy  instructing pt in this and having her return demo Therapeutic Exercises Pulleys into flex x 2:30 mins with VC's during to decrease scapular compensation  PATIENT EDUCATION:  Education details: HEP Person educated: Patient Education method: Medical illustrator Education comprehension: verbalized understanding and returned demonstration  HOME EXERCISE PROGRAM: Reviewed previously given post op HEP. SLS and tandem stance at counter  Bridging SI joint stabilization in to isometric abduction/adduction 01/17/23 - Access Code: [email or web address] URL: [email or web address] Date: 01/19/2023 Prepared by: Berna Spare  Exercises - Forearm Walks on Wall with Resistance Band  - 1 x daily - 3-4 x weekly - 10 reps - Serratus Forearm Push Up Plus at Wall with Elbows  - 1 x daily - 3-4 x weekly - 10 reps  ASSESSMENT:  CLINICAL IMPRESSION: Pt reports increased fatigue today but she already went to water aerobics prior to her visit here. She still gets off balance at times when going from sit to stand and is challenged by the hurdles in the // bars. Encouraged pt to bring in pictures of gym equipment so we can help her transition to a home exercise program in the future.   Pt will benefit from skilled therapeutic intervention to improve on the following deficits: Decreased knowledge of precautions, impaired UE functional use, pain, decreased ROM, postural dysfunction.   PT treatment/interventions: ADL/Self care home management, Therapeutic exercises, Therapeutic activity, Patient/Family education, Self Care, Manual lymph drainage, scar mobilization, Manual therapy, and Re-evaluation   GOALS: Goals reviewed with patient? Yes  LONG TERM GOALS:  (STG=LTG)  GOALS Name Target Date  Goal status  1 Pt will demonstrate she has regained full shoulder ROM and function post operatively compared to baselines.  Baseline: 12/12/2022 MET  12/05/22  2 Patient will report >/= 50% less pain  and sensitivity in her chest midline to tolerate daily tasks with greater ease 12/12/2022 MET 12/05/22 at least 50% improved  3 Patient will improve her DASH score to be back to baseline (4.55) for improved overall UE function. 12/12/2022 MET 01/05/23: 4.55  4 Patient will report good understanding of lymphedema risk reduction practices. 12/12/2022 ONGOING 01/05/23: still requires some cuieng 12/05/22- re eductated pt and issued handouts  5 Pt will receive a compression sleeve to wear when she flies for prophylactic use. 01/02/23 MET 01/05/23  6 Pt will be able to complete 12 sit to stands in 30 sec wihtout UEs to reduce fall risk. 01/02/23 ONGOING 01/05/23: 10 reps   7 Pt will improve BERG balance score to 50 to decrease risk of falls. 01/02/23 MET 01/05/23 - 51  8 Pt will be able to complete SLS for 20 sec to decrease fall risk. 02/02/23 NEW     PLAN:  PT FREQUENCY/DURATION: 2x/week for 4 weeks  PLAN FOR NEXT SESSION:  continue hurdles and add foam pads as well as balance exercises, measure for compression sleeve, begin high level balance and update HEP,  begin scar massage when fully healed (2-3 more weeks ~9/30), MLD as needed   [facility]  [address]  [place] [address]  [phone]    Milagros Loll [place], PT 01/24/2023, 2:59 PM

## 2023-01-24 NOTE — Assessment & Plan Note (Signed)
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  Indeterminate 1.2 cm right breast mass   Recommendation: 10/18/2022:Left mastectomy: Grade 2 invasive lobular cancer 2.7 cm with DCIS, margins negative, suspicious for LVI, ER 0%, PR 0%, HER2 negative, Ki-67 5%, 0/7 lymph nodes Right mastectomy: Benign adjuvant CMF x 6 cycles Adjuvant radiation therapy _______________________________________________________________________________________________________________________________________________________________   Current treatment: CMF cycle 4   She is tolerating treatment well.   Treatment related fatigue: managing with energy conservation We reviewed diet, exercise, and lifestyle recommendations during chemotherapy Her labs are stable she will proceed with treatment without dose modifications.     RTC in 3 weeks for labs, f/u, and her next treatment.

## 2023-01-25 ENCOUNTER — Inpatient Hospital Stay (HOSPITAL_BASED_OUTPATIENT_CLINIC_OR_DEPARTMENT_OTHER): Payer: Medicare Other | Admitting: Hematology and Oncology

## 2023-01-25 ENCOUNTER — Inpatient Hospital Stay: Payer: Medicare Other

## 2023-01-25 ENCOUNTER — Inpatient Hospital Stay: Payer: Medicare Other | Attending: Hematology and Oncology

## 2023-01-25 VITALS — BP 125/70 | HR 67 | Temp 97.8°F | Resp 18 | Ht 63.0 in | Wt 155.6 lb

## 2023-01-25 DIAGNOSIS — Z9012 Acquired absence of left breast and nipple: Secondary | ICD-10-CM | POA: Insufficient documentation

## 2023-01-25 DIAGNOSIS — C50412 Malignant neoplasm of upper-outer quadrant of left female breast: Secondary | ICD-10-CM

## 2023-01-25 DIAGNOSIS — R5383 Other fatigue: Secondary | ICD-10-CM | POA: Insufficient documentation

## 2023-01-25 DIAGNOSIS — Z171 Estrogen receptor negative status [ER-]: Secondary | ICD-10-CM

## 2023-01-25 DIAGNOSIS — Z5111 Encounter for antineoplastic chemotherapy: Secondary | ICD-10-CM | POA: Diagnosis not present

## 2023-01-25 LAB — CMP (CANCER CENTER ONLY)
ALT: 13 U/L (ref 0–44)
AST: 18 U/L (ref 15–41)
Albumin: 4.2 g/dL (ref 3.5–5.0)
Alkaline Phosphatase: 57 U/L (ref 38–126)
Anion gap: 6 (ref 5–15)
BUN: 14 mg/dL (ref 8–23)
CO2: 26 mmol/L (ref 22–32)
Calcium: 9 mg/dL (ref 8.9–10.3)
Chloride: 107 mmol/L (ref 98–111)
Creatinine: 0.61 mg/dL (ref 0.44–1.00)
GFR, Estimated: >60 mL/min (ref >60)
Glucose, Bld: 82 mg/dL (ref 70–99)
Potassium: 4.2 mmol/L (ref 3.5–5.1)
Sodium: 139 mmol/L (ref 135–145)
Total Bilirubin: 0.5 mg/dL (ref <1.2)
Total Protein: 6.6 g/dL (ref 6.5–8.1)

## 2023-01-25 LAB — CBC WITH DIFFERENTIAL (CANCER CENTER ONLY)
Abs Immature Granulocytes: 0.06 10*3/uL (ref 0.00–0.07)
Basophils Absolute: 0.1 10*3/uL (ref 0.0–0.1)
Basophils Relative: 1 %
Eosinophils Absolute: 0.2 10*3/uL (ref 0.0–0.5)
Eosinophils Relative: 4 %
HCT: 37.9 % (ref 36.0–46.0)
Hemoglobin: 13 g/dL (ref 12.0–15.0)
Immature Granulocytes: 1 %
Lymphocytes Relative: 34 %
Lymphs Abs: 1.7 10*3/uL (ref 0.7–4.0)
MCH: 33.6 pg (ref 26.0–34.0)
MCHC: 34.3 g/dL (ref 30.0–36.0)
MCV: 97.9 fL (ref 80.0–100.0)
Monocytes Absolute: 0.6 10*3/uL (ref 0.1–1.0)
Monocytes Relative: 13 %
Neutro Abs: 2.3 10*3/uL (ref 1.7–7.7)
Neutrophils Relative %: 47 %
Platelet Count: 262 10*3/uL (ref 150–400)
RBC: 3.87 MIL/uL (ref 3.87–5.11)
RDW: 13.6 % (ref 11.5–15.5)
WBC Count: 5 10*3/uL (ref 4.0–10.5)
nRBC: 0 % (ref 0.0–0.2)

## 2023-01-25 MED ORDER — SODIUM CHLORIDE 0.9 % IV SOLN: Freq: Once | INTRAVENOUS | Status: AC

## 2023-01-25 MED ORDER — SODIUM CHLORIDE 0.9 % IV SOLN
600.0000 mg/m2 | Freq: Once | INTRAVENOUS | Status: AC
  Administered 2023-01-25: 1000 mg via INTRAVENOUS
  Filled 2023-01-25: qty 50

## 2023-01-25 MED ORDER — FLUOROURACIL CHEMO INJECTION 2.5 GM/50ML
600.0000 mg/m2 | Freq: Once | INTRAVENOUS | Status: AC
  Administered 2023-01-25: 1000 mg via INTRAVENOUS
  Filled 2023-01-25: qty 20

## 2023-01-25 MED ORDER — PALONOSETRON HCL INJECTION 0.25 MG/5ML
0.2500 mg | Freq: Once | INTRAVENOUS | Status: AC
  Administered 2023-01-25: 0.25 mg via INTRAVENOUS
  Filled 2023-01-25: qty 5

## 2023-01-25 MED ORDER — SODIUM CHLORIDE 0.9% FLUSH
10.0000 mL | Freq: Once | INTRAVENOUS | Status: AC
  Administered 2023-01-25: 10 mL

## 2023-01-25 MED ORDER — METHOTREXATE SODIUM CHEMO INJECTION (PF) 50 MG/2ML
40.0000 mg/m2 | Freq: Once | INTRAMUSCULAR | Status: AC
  Administered 2023-01-25: 69.5 mg via INTRAVENOUS
  Filled 2023-01-25: qty 2.78

## 2023-01-25 MED ORDER — HEPARIN SOD (PORK) LOCK FLUSH 100 UNIT/ML IV SOLN
500.0000 [IU] | Freq: Once | INTRAVENOUS | Status: AC | PRN
  Administered 2023-01-25: 500 [IU]

## 2023-01-25 MED ORDER — SODIUM CHLORIDE 0.9% FLUSH
10.0000 mL | INTRAVENOUS | Status: DC | PRN
  Administered 2023-01-25: 10 mL

## 2023-01-25 MED ORDER — DEXAMETHASONE SODIUM PHOSPHATE 10 MG/ML IJ SOLN
10.0000 mg | Freq: Once | INTRAMUSCULAR | Status: AC
  Administered 2023-01-25: 10 mg via INTRAVENOUS
  Filled 2023-01-25: qty 1

## 2023-01-25 NOTE — Patient Instructions (Signed)
[facility] - A DEPT OF [facility]  Discharge Instructions: Thank you for choosing [facility] to provide your oncology and hematology care.   If you have a lab appointment with the Cancer Center, please go directly to the Cancer Center and check in at the registration area.   Wear comfortable clothing and clothing appropriate for easy access to any Portacath or PICC line.   We strive to give you quality time with your provider. You may need to reschedule your appointment if you arrive late (15 or more minutes).  Arriving late affects you and other patients whose appointments are after yours.  Also, if you miss three or more appointments without notifying the office, you may be dismissed from the clinic at the provider's discretion.      For prescription refill requests, have your pharmacy contact our office and allow 72 hours for refills to be completed.    Today you received the following chemotherapy and/or immunotherapy agents: Cytoxan / Methotrexate / Fluorouracil   To help prevent nausea and vomiting after your treatment, we encourage you to take your nausea medication as directed.  BELOW ARE SYMPTOMS THAT SHOULD BE REPORTED IMMEDIATELY: *FEVER GREATER THAN 100.4 F (38 C) OR HIGHER *CHILLS OR SWEATING *NAUSEA AND VOMITING THAT IS NOT CONTROLLED WITH YOUR NAUSEA MEDICATION *UNUSUAL SHORTNESS OF BREATH *UNUSUAL BRUISING OR BLEEDING *URINARY PROBLEMS (pain or burning when urinating, or frequent urination) *BOWEL PROBLEMS (unusual diarrhea, constipation, pain near the anus) TENDERNESS IN MOUTH AND THROAT WITH OR WITHOUT PRESENCE OF ULCERS (sore throat, sores in mouth, or a toothache) UNUSUAL RASH, SWELLING OR PAIN  UNUSUAL VAGINAL DISCHARGE OR ITCHING   Items with * indicate a potential emergency and should be followed up as soon as possible or go to the Emergency Department if any problems should occur.  Please show the CHEMOTHERAPY  ALERT CARD or IMMUNOTHERAPY ALERT CARD at check-in to the Emergency Department and triage nurse.  Should you have questions after your visit or need to cancel or reschedule your appointment, please contact [facility] - A [facility]PT OF [facility]  Dept: [phone]  and follow the prompts.  Office hours are 8:00 a.m. to 4:30 p.m. Monday - Friday. Please note that voicemails left after 4:00 p.m. may not be returned until the following business day.  We are closed weekends and major holidays. You have access to a nurse at all times for urgent questions. Please call the main number to the clinic Dept: [phone] and follow the prompts.   For any non-urgent questions, you may also contact your provider using MyChart. We now offer e-Visits for anyone 38 and older to request care online for non-urgent symptoms. For details visit [email or web address].   Also download the MyChart app! Go to the app store, search "MyChart", open the app, select [place], and log in with your MyChart username and password.

## 2023-01-25 NOTE — Progress Notes (Signed)
Patient Care Team: Mahlon Gammon, MD as PCP - General (Internal Medicine) Tonny Bollman, MD as PCP - Cardiology (Cardiology) Bernette Redbird, MD as Consulting Physician (Gastroenterology) Antony Contras, MD as Consulting Physician (Ophthalmology) [place], Amy, MD as Consulting Physician (Dermatology) Mast, Man X, NP as Nurse Practitioner (Internal Medicine) Pershing Proud, RN as Oncology Nurse Navigator Donnelly Angelica, RN as Oncology Nurse Navigator Serena Croissant, MD as Consulting Physician (Hematology and Oncology) Harriette Bouillon, MD as Consulting Physician (General Surgery)  DIAGNOSIS:  Encounter Diagnosis  Name Primary?   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 Indeterminate 1.2 cm right breast mass    09/30/2022 Initial Biopsy   Right breast biopsy demonstrates intraductal papilloma and no evidence of atypia or malignancy.   10/18/2022 Surgery   Left mastectomy scheduled with Dr. Luisa Hart   10/18/2022 Surgery   Left mastectomy: Grade 2 invasive lobular cancer 2.7 cm with DCIS, margins negative, suspicious for LVI, ER 0%, PR 0%, HER2  negative, Ki-67 5%, 0/7 lymph nodes Right mastectomy: Benign   11/23/2022 -  Chemotherapy   Patient is on Treatment Plan : BREAST Adjuvant CMF IV q21d       CHIEF COMPLIANT: Cycle 4 CMF  HISTORY OF PRESENT ILLNESS: History of Present Illness   The patient, currently undergoing chemotherapy for breast cancer, reports overall good health and well-being. She expresses gratitude for the effectiveness of the treatment and the minimal side effects experienced. The primary side effect reported is fatigue, which the patient describes as serious, impacting her ability to engage in physical activities such as water aerobics and walking. The patient also reports easy bruising and constipation, which she manages effectively.  The patient has a history of heart disease and is under the care of a cardiologist. She also reports a progressive weakness in her legs, which she attributes to both her age and the effects of chemotherapy. The patient is considering radiation therapy but expresses uncertainty due to her age and the potential for recurrence of the cancer.         ALLERGIES:  has No Known Allergies.  MEDICATIONS:  Current Outpatient Medications  Medication Sig Dispense Refill   acetaminophen (TYLENOL) 650 MG CR tablet Take 650 mg by mouth as needed for pain.     ascorbic acid (VITAMIN C) 500 MG tablet Take 500 mg by mouth daily.     Calcium Carb-Cholecalciferol (CALCIUM 600+D) 600-800 MG-UNIT TABS Take 3 tablets by mouth daily.     clopidogrel (PLAVIX) 75 MG tablet TAKE 1 TABLET BY MOUTH DAILY 90 tablet 2   denosumab (PROLIA) 60 MG/ML SOSY injection Inject 60 mg into the skin every 6 (six) months. Administered  at [facility] q83m     ferrous sulfate 325 (65 FE) MG tablet Take 325 mg by mouth daily with breakfast.     gabapentin (NEURONTIN) 100 MG capsule Take 100 mg by mouth daily.     levothyroxine (SYNTHROID) 112 MCG tablet Take 1 tablet (112 mcg total) by mouth daily. 90 tablet 3    lidocaine-prilocaine (EMLA) cream Apply to affected area once (Patient not taking: Reported on 11/16/2022)     loratadine (CLARITIN) 10 MG tablet Take 10 mg by mouth as needed for allergies. (Patient not taking: Reported on 11/16/2022)     melatonin 5 MG TABS Take 5 mg by mouth at bedtime.     nitroGLYCERIN (NITROSTAT) 0.4 MG SL tablet TAKE 1 TABLET UNDER THE TONGUE EVERY 5 MINUTES AS NEEDED FOR CHEST PAIN (Patient not taking: Reported on 11/16/2022) 25 tablet 6   ondansetron (ZOFRAN) 8 MG tablet Take 1 tablet (8 mg total) by mouth every 8 (eight) hours as needed for nausea or vomiting. Start on the third day after chemotherapy. (Patient not taking: Reported on 11/09/2022) 30 tablet 1   oxyCODONE (OXY IR/ROXICODONE) 5 MG immediate release tablet Take 1 tablet (5 mg total) by mouth every 6 (six) hours as needed for severe pain. (Patient not taking: Reported on 11/03/2022) 15 tablet 0   prochlorperazine (COMPAZINE) 10 MG tablet Take 1 tablet (10 mg total) by mouth every 6 (six) hours as needed for nausea or vomiting. (Patient not taking: Reported on 11/16/2022) 30 tablet 1   rosuvastatin (CRESTOR) 20 MG tablet TAKE 1 TABLET BY MOUTH DAILY 90 tablet 3   Study - [name] - olpasiran (AMG 890) 142 mg/mL or placebo SQ injection (PI-Hilty) Inject 142 mg into the skin once. For investigational use only - Inject 1 mL (1 prefilled syringe) subcutaneously into appropriate injection site per protocol. (Approved injection site(s): upper arm, upper thigh, abdomen). Please contact [facility] if any questions.     VITAMIN D PO Take 1 capsule by mouth daily.     Current Facility-Administered Medications  Medication Dose Route Frequency Provider Last Rate Last Admin   [START ON 05/11/2023] denosumab (PROLIA) injection 60 mg  60 mg Subcutaneous Once Mahlon Gammon, MD       Facility-Administered Medications Ordered in Other Visits  Medication Dose Route Frequency Provider Last Rate Last Admin   sodium  chloride flush (NS) 0.9 % injection 10 mL  10 mL Intracatheter PRN Serena Croissant, MD   10 mL at 01/25/23 1406    PHYSICAL EXAMINATION: ECOG PERFORMANCE STATUS: 1 - Symptomatic but completely ambulatory  Vitals:   01/25/23 1041  BP: 125/70  Pulse: 67  Resp: 18  Temp: 97.8 F (36.6 C)  SpO2: 96%   Filed Weights   01/25/23 1041  Weight: 155 lb 9.6 oz (70.6 kg)    LABORATORY DATA:  I have reviewed the data as listed    Latest Ref Rng & Units 01/25/2023   10:20 AM 01/04/2023   11:26 AM 12/14/2022   10:04 AM  CMP  Glucose 70 - 99 mg/dL 82  93  093   BUN 8 - 23 mg/dL 14  12  11    Creatinine 0.44 - 1.00 mg/dL 2.35  5.73  2.20   Sodium 135 - 145 mmol/L 139  139  140   Potassium 3.5 - 5.1 mmol/L 4.2  4.0  4.0   Chloride 98 - 111 mmol/L 107  108  109   CO2 22 - 32  mmol/L 26  26  26    Calcium 8.9 - 10.3 mg/dL 9.0  8.6  8.8   Total Protein 6.5 - 8.1 g/dL 6.6  6.2  6.3   Total Bilirubin <1.2 mg/dL 0.5  0.5  0.4   Alkaline Phos 38 - 126 U/L 57  50  53   AST 15 - 41 U/L 18  14  15    ALT 0 - 44 U/L 13  13  13      Lab Results  Component Value Date   WBC 5.0 01/25/2023   HGB 13.0 01/25/2023   HCT 37.9 01/25/2023   MCV 97.9 01/25/2023   PLT 262 01/25/2023   NEUTROABS 2.3 01/25/2023    ASSESSMENT & PLAN:  Malignant neoplasm of upper-outer quadrant of left breast in female, estrogen receptor negative (HCC) 09/13/2022: Screening mammogram detected spiculated left breast mass upper outer quadrant 5 o'clock position measuring 2.1 cm, axilla negative, 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  Indeterminate 1.2 cm right breast mass   Recommendation: 10/18/2022:Left mastectomy: Grade 2 invasive lobular cancer 2.7 cm with DCIS, margins negative, suspicious for LVI, ER 0%, PR 0%, HER2 negative, Ki-67 5%, 0/7 lymph nodes Right  mastectomy: Benign adjuvant CMF x 6 cycles Adjuvant radiation therapy _______________________________________________________________________________________________________________________________________________________________   Current treatment: CMF cycle 4   She is tolerating treatment well.   Treatment related fatigue: managing with energy conservation We reviewed diet, exercise, and lifestyle recommendations during chemotherapy Her labs are stable she will proceed with treatment without dose modifications.     RTC in 3 weeks for labs, f/u, and her next treatment.  ------------------------------------- Assessment and Plan    Cancer (type unspecified) Patient is currently undergoing treatment and tolerating it well. Reports fatigue and easy bruising. No nausea, diarrhea, or significant constipation. -Continue current treatment regimen. -Advise patient to listen to her body regarding exercise and rest.  Radiation Therapy Patient is considering radiation therapy but is unsure of the benefits given her age and the potential for recurrence. -Discussed the risk/benefit of radiation therapy. -Encourage patient to discuss further with a radiation oncologist.  General Health Maintenance -Next appointment scheduled for February 15, 2023.          No orders of the defined types were placed in this encounter.  The patient has a good understanding of the overall plan. she agrees with it. she will call with any problems that may develop before the next visit here. Total time spent: 30 mins including face to face time and time spent for planning, charting and co-ordination of care   Tamsen Meek, MD 01/25/23

## 2023-01-26 ENCOUNTER — Other Ambulatory Visit: Payer: Self-pay | Admitting: Cardiovascular Disease

## 2023-01-26 ENCOUNTER — Ambulatory Visit: Payer: Medicare Other | Admitting: Rehabilitation

## 2023-01-30 ENCOUNTER — Ambulatory Visit: Payer: Medicare Other | Admitting: Physical Therapy

## 2023-01-30 ENCOUNTER — Encounter: Payer: Self-pay | Admitting: Physical Therapy

## 2023-01-30 DIAGNOSIS — R262 Difficulty in walking, not elsewhere classified: Secondary | ICD-10-CM | POA: Diagnosis not present

## 2023-01-30 DIAGNOSIS — M25612 Stiffness of left shoulder, not elsewhere classified: Secondary | ICD-10-CM

## 2023-01-30 DIAGNOSIS — M25611 Stiffness of right shoulder, not elsewhere classified: Secondary | ICD-10-CM

## 2023-01-30 DIAGNOSIS — R293 Abnormal posture: Secondary | ICD-10-CM | POA: Diagnosis not present

## 2023-01-30 DIAGNOSIS — M6281 Muscle weakness (generalized): Secondary | ICD-10-CM

## 2023-01-30 DIAGNOSIS — Z483 Aftercare following surgery for neoplasm: Secondary | ICD-10-CM

## 2023-01-30 DIAGNOSIS — Z171 Estrogen receptor negative status [ER-]: Secondary | ICD-10-CM

## 2023-01-30 NOTE — Therapy (Signed)
OUTPATIENT PHYSICAL THERAPY BREAST CANCER TREATMENT   Patient Name: Jessica [name] MRN: [id number] DOB:1940-09-01, 82 y.o., female Today's Date: 01/30/2023  END OF SESSION:  PT End of Session - 01/30/23 1510     Visit Number 18    Number of Visits 21    Date for PT Re-Evaluation 02/02/23    PT Start Time 1508    PT Stop Time 1551    PT Time Calculation (min) 43 min    Activity Tolerance Patient tolerated treatment well    Behavior During Therapy [facility] for tasks assessed/performed                 Past Medical History:  Diagnosis Date   Anemia 12/2015   Bronchitis, chronic (HCC)    in her 11s   Cataract    Chronic kidney disease    in 20s had nephritis   Closed fracture of right distal humerus    right   Coronary artery disease    Hand, foot and mouth disease (HFMD)    Hearing loss 05/03/2016   High cholesterol    Hypothyroidism    Myocardial infarction (HCC)    Osteopenia    Pericarditis 01/1999   Pneumothorax 01/1999   Thyroid disorder    Vitamin B 12 deficiency    Past Surgical History:  Procedure Laterality Date   birthmark      removed,left leg   BREAST BIOPSY Left 09/13/2022   [place] LT BREAST BX W LOC DEV 1ST LESION IMG BX SPEC US GUIDE 09/13/2022 [facility] MAMMOGRAPHY   CARDIAC CATHETERIZATION     CATARACT EXTRACTION  2017   CORONARY STENT INTERVENTION N/A 09/01/2020   Procedure: CORONARY STENT INTERVENTION;  Surgeon: Lyn Records, MD;  Location: [facility];  Service: Cardiovascular;  Laterality: N/A;   LEFT HEART CATH AND CORONARY ANGIOGRAPHY N/A 09/01/2020   Procedure: LEFT HEART CATH AND CORONARY ANGIOGRAPHY;  Surgeon: Lyn Records, MD;  Location: [facility];  Service: Cardiovascular;  Laterality: N/A;   ORIF HUMERUS FRACTURE Right 04/10/2019   Procedure: RIGHT OPEN REDUCTION INTERNAL FIXATION (ORIF) DISTAL HUMERUS FRACTURE WITH EXTENSION;  Surgeon: Bjorn Pippin, MD;  Location: [facility];  Service: Orthopedics;   Laterality: Right;   PORTACATH PLACEMENT N/A 11/15/2022   Procedure: PORT PLACEMENT WITH ULTRASOUND GUIDANCE;  Surgeon: Harriette Bouillon, MD;  Location: [facility];  Service: General;  Laterality: N/A;   SIMPLE MASTECTOMY WITH AXILLARY SENTINEL NODE BIOPSY Bilateral 10/18/2022   Procedure: BILATERAL SIMPLE MASTECTOMY;  Surgeon: Harriette Bouillon, MD;  Location: [facility];  Service: General;  Laterality: Bilateral;  PEC BLOCK   TONSILLECTOMY AND ADENOIDECTOMY  1952?   Patient Active Problem List   Diagnosis Date Noted   Malignant neoplasm of upper-outer quadrant of left breast in female, estrogen receptor negative (HCC) 09/21/2022   Atherosclerotic heart disease of native coronary artery with other forms of angina pectoris (HCC) 04/08/2022   Swelling of left foot 04/07/2022   Left hand fracture, sequela 04/07/2022   Depression with anxiety 04/07/2022   NSTEMI (non-ST elevated myocardial infarction) (HCC) 09/01/2020   Closed fracture of right distal humerus 04/10/2019   Dyspnea 05/09/2018   Vitamin B 12 deficiency 05/09/2018   Cough 03/06/2018   Senile osteoporosis 11/20/2017   Iron deficiency 04/26/2017   Lichen sclerosus 04/26/2017   External hemorrhoid 04/26/2017   Weakness of left leg 06/07/2016   Balance disorder 05/24/2016   Memory deficit 05/24/2016   Anemia 05/19/2016  Hearing loss 05/03/2016   Hypothyroidism 07/24/2013   Bradycardia on ECG 07/24/2013   Numbness 08/07/2012   HYPERLIPIDEMIA 12/29/2006    REFERRING PROVIDER: Dr. Harriette Bouillon  REFERRING DIAG: Left breast cancer  THERAPY DIAG:  Difficulty in walking, not elsewhere classified  Muscle weakness (generalized)  Stiffness of left shoulder, not elsewhere classified  Stiffness of right shoulder, not elsewhere classified  Aftercare following surgery for neoplasm  Abnormal posture  Malignant neoplasm of upper-outer quadrant of left breast in female, estrogen receptor negative (HCC)  Rationale for  Evaluation and Treatment: Rehabilitation  ONSET DATE: 10/18/2022  SUBJECTIVE:                                                                                                                                                                                           SUBJECTIVE STATEMENT: The chemo is catching up with me. I have been so sluggish. I just want to sleep. 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 She underwent a bilateral mastectomy and left sentinel node biopsy (7 negative nodes) on 10/18/2022. It is triple negative with a Ki67 of 5%. She has some cardiac history and had an elbow fracture in 2022 resulting in surgery where pins were placed.   PATIENT GOALS:  Reassess how my recovery is going related to arm function, pain, and swelling.  PAIN:  Are you having pain? Yes: NPRS scale: 1-2/10 Pain location: R knee Pain description: sharp when pressed, sensitive Aggravating factors: pressing it Relieving factors: not pressing it/nothing, being still    PRECAUTIONS: Recent Surgery, left UE Lymphedema risk  RED FLAGS: None   ACTIVITY LEVEL / LEISURE: She is walking half to 1 mile daily   OBJECTIVE:   PATIENT SURVEYS:  QUICK DASH:      OBSERVATIONS: Bilateral incisions appear to be healing well. There is skin overlap and edema present with tissue on the lateral aspects on her chest.  POSTURE:  Forward head and rounded shoulders   LYMPHEDEMA ASSESSMENT:   UPPER EXTREMITY AROM/PROM:   A/PROM RIGHT   eval   RIGHT 11/14/2022  Shoulder extension 63 60  Shoulder flexion 128 110  Shoulder abduction 137 136  Shoulder internal rotation 68 75  Shoulder external rotation 73 75                          (Blank rows = not tested)   A/PROM LEFT   eval LEFT 11/14/2022 LET  12/05/22  Shoulder  extension 53 49   Shoulder flexion 135 109 154   Shoulder abduction 146 114 161  Shoulder internal rotation 74 77   Shoulder external rotation 78 70                           (Blank rows = not tested)     UPPER EXTREMITY STRENGTH: WFL  LOWER EXTREMITY STRENGTH:   MMT Right eval  Hip flexion 5/5  Hip extension 4/5  Hip abduction 4/5  Hip adduction   Hip internal rotation   Hip external rotation   Knee flexion 5/5  Knee extension 5/5  Ankle dorsiflexion 5/5  Ankle plantarflexion   Ankle inversion   Ankle eversion    (Blank rows = not tested)  A/PROM LEFT eval  Hip flexion 5/5  Hip extension 3/5  Hip abduction 4+/5  Hip adduction   Hip internal rotation   Hip external rotation   Knee flexion 5/5  Knee extension 5/5  Ankle dorsiflexion 5/5  Ankle plantarflexion   Ankle inversion   Ankle eversion    (Blank rows = not tested)  FUNCTIONAL TEST:  30 SEC SIT TO STAND: 9 reps which is below average for her age  BERG Balance Test: 44/56, anything less than 45 is a greater risk of falling  LYMPHEDEMA ASSESSMENTS: NOT ASSESSED AS PT IS NOT HAVING ANY LYMPH NODES REMOVED at eval - did have lymph nodes removed during surgery   LANDMARK RIGHT   eval RIGHT 11/14/2022  10 cm proximal to olecranon process  Not entered due pt was not originally having nodes removed 27.6  Olecranon process   24.3  10 cm proximal to ulnar styloid process   20.1  Just proximal to ulnar styloid process   15.1  Across hand at thumb web space   16.7  At base of 2nd digit   6.1  (Blank rows = not tested)   LANDMARK LEFT   eval LEFT 11/14/2022  10 cm proximal to olecranon process   Not entered due pt was not originally having nodes removed 26.4  Olecranon process   23.4  10 cm proximal to ulnar styloid process   19.5  Just proximal to ulnar styloid process   13.9  Across hand at thumb web space   16.4  At base of 2nd digit   6.1  (Blank rows = not tested) Surgery type/Date: 10/18/2022 Number of lymph nodes removed: 7 Current/past treatment  (chemo, radiation, hormone therapy): none Other symptoms:  Heaviness/tightness No Pain Yes Pitting edema No Infections No Decreased scar mobility Yes Stemmer sign No   TODAY'S TREATMENT: Date: 01/30/23: Nustep level 4 x 10 min - seat at 8 no UEs, 598 steps Demonstrated how to don/doff compression sleeve and assessed fit, had pt demonstrate correct donning method Looked at pictures of gym equipment available to patient and discussed which machines to use and importance of exercise to decrease fatigue levels during chemo Assessed progress towards goals in therapy  01/24/23:  Therapeutic Exercises Nustep level 4 x 10 min - seat at 8 no UE's, 611 steps Bil leg press 100 lbs, 3 x 10 3 way hip machine 40# x 10 each with VC's throughout for technique Supine on large mat for snow angels with head not on pillow but chin tucked x 10 reps with increased tightness, unable to go above 90 degrees with hands still touching mat In // bars for stepping over hurdles in forward direction x 8  knocked over hurdle on first attempt but then did not knock over any more when stepping with intention; on blue oval for hip 3 way raises x 10 each direction with fingertip support when standing on left and only occasional fingertip support on R Forearms on wall with yellow theraband for wall walking x 10, then scap retract x 10, then with back against wall for bil UE snow angels x 3 returning therapist demo for each  In // bars for mini squats on purple foam x 10 with cuing to keep chest up Seated for bil HS and piriformis figure 4 stretch x 2 , 20-30 sec   01/17/23:  Therapeutic Exercises Nustep level 4 x 10 min - seat at 8 no UE's, 582 steps SOZO redone, pt WNLs and answered her questions about this Bil leg press 100 lbs, 3 x 10 3 way hip machine 40# x 10 each with VC's throughout for technique In // bars for stepping over hurdles in forward direction x 8 knocked over hurdle ~3x; on blue oval for hip 3 way  raises x 10 each direction with fingertip to no support throughout Forearms on wall with yellow theraband for wall walking x 3, then scap retract x 5, then with back against wall for bil UE snow angels x 3 returning therapist demo for each (pt asked for exercises for posture so she will do these at home) In // bars for mini squats on purple foam x 10 with cuing to keep chest up Seated for bil HS and piriformis figure 4 stretch x 2 , 20-30 sec holds each reviewing with pt importance of incorporating stretches into her day, especially as she gets back into her ex routine  01/12/23 Nustep level 4 x 10 min - seat at 8 no Ues3, 680 steps Leg press x 100 lbs x 20 reps 3 way hip machine x 20 reps each 45# with therapist provided v/c for positioning Seated hamstring stretch x 2 reps x 60 sec holds - R side more tight Hurdles in // bars in forward direction x 8 with only 1 time where hurdles were knocked over, sidestepping at 6 with no losses of balance and backwards x 2 with increased difficulty with this Standing on foam - 3 way hip x 10 reps in each direction with no HHA on // bars but close SBA for safety Hurdles in // bars in forward direction x 8 with only 1 time where hurdles were knocked over, sidestepping at 6 with no losses of balance and backwards x 2 with pt feeling challenged but no loss of balance 10 mini squats with pt returning therapist demo  01/10/23 Nustep level 4 x 10 min - seat at 8 no Ues3, 537 steps Leg press x 90 lbs x 20 reps 3 way hip machine x 10 reps each 40# with therapist provided v/c for positioning Seated hamstring stretch x 2 reps x 60 sec holds - R side more tight Seated piriformis x 2 reps x 60 sec holds  Hurdles in // bars in forward direction x 8 with only 1 time where hurdles were knocked over, sidestepping at 6 with no losses of balance and backwards x 2 with increased difficulty in this direction Supine over half foam roll with arms outstretched x 3 min for pec  stretch to decrease tightness Heel/toe on airex x 8 reps with occasional finger touch assist on // bars  Colored circles with cones on top with pt in middle - said a color  name and pt had to tap that color with her foot - she had increased difficulty stepping to the R and backwards   01/05/23 Nustep level 4 x 10 min - seat at 8 no Ues3, 545 steps Reassessed berg balance and 30 sec sit to stand Reassessed goals Leg press x 85 lbs x 20 reps 3 way hip machine x 20 reps each 45# with pt returning therapist demo Heel/toe on airex x 8 reps with occasional finger touch assist on // bars then sidestepping on airex x 4 with pt reporting this was less challenging  01/02/23 Nustep level 3 x 10 min - seat at 8 no UEs with v/c to keep knees from adducting, 579 steps Leg press x 80 lbs x 20 reps 3 way hip machine x 20 reps each 45# with pt returning therapist demo Supine bridging x 10 reps with v/c to keep knees hip width apart  Pelvic tilts x 10 reps with v/c for correct form Pelvic tilts and mini marches x 10 reps each with v/c to move slowly and controlled Pelvic tilts and mini hip abd in hooklying x 10 reps with v/c to keep core engaged to avoid movement at lower trunk Stepping over hurdles in // bars with 1 cobblestone square in between 2 of the hurdles and an airex pad in between 2 of the hurdles with occasional min A and CGA throughout x 6 reps going forward, 4 reps going side to side; then removed the air ex and cobble stone and pt did much better and added x 2 backwards for this - pt found cobblestone and airex challenging and she lost balance 2x and required min A to correct  12/26/22 Nustep level 4 x 11 min - seat at 8 no UEs with v/c to keep knees from adducting, 546 steps Leg press x 80 lbs x 20 reps 3 way hip machine x 15 reps each 45# with pt returning therapist demo Supine bridging x 10 reps with v/c to keep knees hip width apart  Pelvic tilts x 10 reps with v/c for correct form Pelvic  tilts and mini marches x 10 reps each with v/c to move slowly and controlled Pelvic tilts and mini hip abd in hooklying x 10 reps with v/c to keep core engaged to avoid movement at lower trunk Measured pt for a compression sleeve for prophylactic use and assisted her with ordering from compression guru - [employer] S1 beige 20-31mmHg Stepping over hurdles in // bars x 10 reps with pt initially able to clear hurdles and when she became distracted she did not clear them - demo to pt how to step over them intentionally and pt was able to return demo without touching the hurdles; then repeated going side to side x 6, then backwards x 1    12/21/22 Sidelying on L side: STM to R lower back/SI joint area with increased muscle tightness palpable, then use suction cup with cocoa butter moivng in all directions while also pulling upwards to improve blood flow and decrease tightness Supine bridging x 20 reps with v/c to keep knees hip width apart - pt reported decreased discomfort after this Supine in hook lying with folded pillow between knees and gait belt around thighs alternating between isometric hip abduction and adduction with 5 sec holds x 5 reps each  Nustep level 2 x 10 min - seat at 8 no UEs with v/c to keep knees from adducting, 512 steps  12/19/22 Nustep level 3 x 10  min - seat at 8 no UEs with v/c to keep knees from adducting, 540 steps 3 way hip machine:  45 lbs x 10 reps bilaterally in to flexion, abduction, and extension with pt feeling challenged by this Leg press x 70 lbs x 20 reps with seat at 5 Supine knee to chest stretch x 15 sec hold x 3 Supine trunk rotation stretch x 30 sec holds to stretch R side and decrease back pain Seated edge of mat: hamstring stretch x 30 sec holds x 2 reps bilaterally Piriformis stretch x 30 sec holds bilaterally x 2 Stepping over hurdles x 8 with occasional inability to clear hurdles- pt felt challenged by this especially while talking and  walking Forward step ups x 10 reps bilaterally with pt with occasional loss of balance and need for CGA Lateral steps ups x 10 reps bilaterally with occasional loss of balance that required min assist to correct and CGA- minA to Assessed bilateral lateral trunk lymphedema and her L is still considerably swollen- assessed compression bra and it was ill fitting so suggested wearing the [employer] bra instead - educated pt to discuss swelling with her surgeon at her appt on 10/21  12/13/22 Nustep level 5 x 10 min - seat at 8 no UEs with v/c to keep knees from adducting, 540 steps 3 way hip machine:  40 lbs x 10 reps bilaterally in to flexion, abduction, and extension with pt feeling challenged by this Leg press x 70 lbs x 20 reps with seat at 5 Heel/toe walking on air ex beam x 6 initially with 1 HHA then down to finger tips then to occasional finger touch on bars for support Sidestepping down airex balance beam x 4 reps with occasional finger tip touch on bars  Forward step ups x 10 reps bilaterally with pt feeling challenged by this with occasional finger touch on // bars Lateral steps ups x 10 reps bilaterally with occasional finger touch on bars with increased challenge with this Backwards step ups x 10 with 1 HHA on // bars with increased difficulty and need for assist to get her foot on step  12/07/22 Nustep level 5 x 10 min - seat at 8 no UEs with v/c to keep knees from adducting, 490 steps 3 way hip machine: initially started with 10 reps of extension at 25 lbs but this was too easy so increased resistance to 40 lbs x 10 reps bilaterally in to flexion, abduction, and extension with pt feeling challenged by this IT band rolling bilaterally x 5 min Seated hamstring stretch x 30 sec holds x 2 reps bilaterally Quad stretch in // bars x 60 sec holds bilaterally Heel/toe walking on air ex beam x 4 initially with 2 HHA then down to finger tips Standing on 1 leg on air ex with finger tip assist  on // bars with pt able to maintain about 1-3 sec holds Staining on air - 3 way hip with no resistance x 10 reps each with fingertip to HHA to challenge balance   12/05/22: Pulleys x 2 min in direction of flexion and 2 min in direction of abduction Discussed importance of doing supine scapular strengtheing exercises in supine for now due to compensation patterns in standing Educated pt in lymphedema risk reduction practices and issued handouts  11/30/22: Therapeutic Exercises Pulleys into flexion x 2 mins and then in to abduction x 2 min Ball flexion x 10 on wall with v/c for proper form, abduction x 10 bilaterally  with v/c for proper form Instructed pt in supine scapular strengthening exercises with yellow band x 10 reps each with pt returning therapist demo as follows: narrow and wide grip flexion, ER, horizontal abduction, and diagonals - pt did not have any increased pain with these - issued these as part of her HEP Manual Therapy P/ROM to bil shoulders into flexion, abd and ER to pts tolerance with therapist able to achieve full PROM MFR to Lt UE at area of cording from axilla to medial upper arm with numerous cords palpable MLD: Rt inguinal nodes, Rt axillo-inguinal anastomosis and then focused on edema inferior to Rt mastectomy   11/28/22: Therapeutic Exercises Pulleys into flexion x 2 mins and then in to abduction x 2 min Ball flexion x 10 on wall with v/c for proper form, abduction x 10 bilaterally with v/c for proper form Manual Therapy P/ROM to bil shoulders into flexion, abd and ER to pts tolerance with therapist able to achieve full PROM MFR to Lt UE at area of cording from axilla to medial upper arm with numerous cords palpable MLD: Rt inguinal nodes, Rt axillo-inguinal anastomosis and then focused on edema inferior to Rt mastectomy instructing pt in this and having her return demo then repeated on L side which appears more swollen today Edema management: cut 1/2 grey foam and  placed in small stockinette to wear across bilateral chest to decrease post op edema  11/21/22: Manual Therapy P/ROM to bil shoulders into flexion, abd and D2 to pts tolerance and with scapular depression by therapist throughout MFR to Lt UE at area of cording from axilla to medial upper arm MLD: Rt inguinal nodes, Rt axillo-inguinal anastomosis and then focused on edema inferior to Rt mastectomy instructing pt in this and having her return demo Therapeutic Exercises Pulleys into flex x 2:30 mins with VC's during to decrease scapular compensation  PATIENT EDUCATION:  Education details: HEP Person educated: Patient Education method: Medical illustrator Education comprehension: verbalized understanding and returned demonstration  HOME EXERCISE PROGRAM: Reviewed previously given post op HEP. SLS and tandem stance at counter  Bridging SI joint stabilization in to isometric abduction/adduction 01/17/23 - Access Code: [email or web address] URL: [email or web address] Date: 01/19/2023 Prepared by: Berna Spare  Exercises - Forearm Walks on Wall with Resistance Band  - 1 x daily - 3-4 x weekly - 10 reps - Serratus Forearm Push Up Plus at Wall with Elbows  - 1 x daily - 3-4 x weekly - 10 reps  ASSESSMENT:  CLINICAL IMPRESSION: Assessed pt's progress towards goals in therapy. She has now met all goals for therapy. She is feeling very fatigued from chemo and has no more chemo treatments left. She will complete chemo in January. Demonstrated proper donning/doffing of her compression sleeve. Discussed gym equipment available to pt and looked at pictures of equipment and discussed which equipment to use to continue with her exercises independently. Discussed importance of continuing to walk and exercise to decrease fatigue from chemo. Pt will be discharged from skilled PT services at this time.   Pt will benefit from skilled therapeutic intervention to improve on the following  deficits: Decreased knowledge of precautions, impaired UE functional use, pain, decreased ROM, postural dysfunction.   PT treatment/interventions: ADL/Self care home management, Therapeutic exercises, Therapeutic activity, Patient/Family education, Self Care, Manual lymph drainage, scar mobilization, Manual therapy, and Re-evaluation   GOALS: Goals reviewed with patient? Yes  LONG TERM GOALS:  (STG=LTG)  GOALS Name Target Date  Goal status  1 Pt will  demonstrate she has regained full shoulder ROM and function post operatively compared to baselines.  Baseline: 12/12/2022 MET  12/05/22  2 Patient will report >/= 50% less pain and sensitivity in her chest midline to tolerate daily tasks with greater ease 12/12/2022 MET 12/05/22 at least 50% improved  3 Patient will improve her DASH score to be back to baseline (4.55) for improved overall UE function. 12/12/2022 MET 01/05/23: 4.55  4 Patient will report good understanding of lymphedema risk reduction practices. 12/12/2022 MET 01/30/23 01/05/23: still requires some cuieng 12/05/22- re eductated pt and issued handouts  5 Pt will receive a compression sleeve to wear when she flies for prophylactic use. 01/02/23 MET 01/05/23  6 Pt will be able to complete 12 sit to stands in 30 sec wihtout UEs to reduce fall risk. 01/02/23 MET 01/30/23: 13 reps 01/05/23: 10 reps   7 Pt will improve BERG balance score to 50 to decrease risk of falls. 01/02/23 MET 01/05/23 - 51  8 Pt will be able to complete SLS for 20 sec to decrease fall risk. 02/02/23 MET 01/30/23 29 sec     PLAN:  PT FREQUENCY/DURATION: 2x/week for 4 weeks  PLAN FOR NEXT SESSION:  d/c this visit   [facility]  [address], [address]  [place] [address]  [phone]    Milagros Loll [place], [place] 01/30/2023, 3:59 PM  PHYSICAL THERAPY DISCHARGE SUMMARY  Visits from Start of Care: 18  Current functional level related to goals / functional outcomes: All goals  met   Remaining deficits: None   Education / Equipment: HEP, lymphedema education, compression garment   Patient agrees to discharge. Patient goals were met. Patient is being discharged due to meeting the stated rehab goals.  Milagros Loll [place], [place] 01/30/23 4:00 PM

## 2023-02-06 ENCOUNTER — Encounter: Payer: Medicare Other | Admitting: *Deleted

## 2023-02-06 ENCOUNTER — Encounter: Payer: Self-pay | Admitting: Cardiovascular Disease

## 2023-02-06 ENCOUNTER — Other Ambulatory Visit: Payer: Self-pay

## 2023-02-06 ENCOUNTER — Other Ambulatory Visit: Payer: Self-pay | Admitting: Cardiovascular Disease

## 2023-02-06 ENCOUNTER — Ambulatory Visit: Payer: Medicare Other | Attending: Cardiovascular Disease | Admitting: Cardiovascular Disease

## 2023-02-06 ENCOUNTER — Encounter: Payer: Self-pay | Admitting: Hematology and Oncology

## 2023-02-06 VITALS — BP 120/70 | HR 63 | Ht 63.0 in | Wt 158.8 lb

## 2023-02-06 DIAGNOSIS — I2510 Atherosclerotic heart disease of native coronary artery without angina pectoris: Secondary | ICD-10-CM | POA: Diagnosis not present

## 2023-02-06 DIAGNOSIS — Z23 Encounter for immunization: Secondary | ICD-10-CM | POA: Diagnosis not present

## 2023-02-06 DIAGNOSIS — E782 Mixed hyperlipidemia: Secondary | ICD-10-CM | POA: Insufficient documentation

## 2023-02-06 DIAGNOSIS — Z006 Encounter for examination for normal comparison and control in clinical research program: Secondary | ICD-10-CM

## 2023-02-06 MED ORDER — STUDY - OCEAN(A) - OLPASIRAN (AMG 890) 142 MG/ML OR PLACEBO SQ INJECTION (PI-HILTY)
142.0000 mg | PREFILLED_SYRINGE | Freq: Once | SUBCUTANEOUS | Status: AC
  Administered 2023-02-06: 142 mg via SUBCUTANEOUS
  Filled 2023-02-06: qty 1

## 2023-02-06 NOTE — Assessment & Plan Note (Signed)
Treated with rosuvastatin and also participating in the [name] trial.  Last lipids that I have on file from March of this year show an LDL of 75.  Goal LDL cholesterol is less than 70.  Will update labs at the time of her next visit.

## 2023-02-06 NOTE — Research (Signed)
Jessica [name]  Week 84  Vitals: [x]  Experience any AE/SAE/Hospitalizations []  Yes [x]  No  If yes please explain:  Labs collected:  no labs  Discussed with patient about the importance of not letting any one draw cholesterol or lipids. As to we are blinded to results. Reassured patient if we needed to be contacted the study team would reach out to our unblinded person.   ~reminder fasting labs, EKG, MD to see next visit ~  Non-Fatal Potential Endpoint Assessment Yes  No   Has the subject experienced/undergone any of the following since the last visit/contact?   []   [x]    Any Coronary Artery Revascularization/Cerebrovascular Revascularization/ Peripheral Artery Revascularization/Amputation Procedure   []   [x]    Myocardial Infarction []   [x]    Stroke   []   [x]    Provide the date for the non-fatal Potential Endpoints status:   []   [x]     IP admin please see MAR (please add Box # to comment section on MAR) [x]     Current Outpatient Medications:    acetaminophen (TYLENOL) 650 MG CR tablet, Take 650 mg by mouth as needed for pain., Disp: , Rfl:    ascorbic acid (VITAMIN C) 500 MG tablet, Take 500 mg by mouth daily., Disp: , Rfl:    Calcium Carb-Cholecalciferol (CALCIUM 600+D) 600-800 MG-UNIT TABS, Take 3 tablets by mouth daily., Disp: , Rfl:    clopidogrel (PLAVIX) 75 MG tablet, TAKE 1 TABLET BY MOUTH DAILY, Disp: 90 tablet, Rfl: 1   denosumab (PROLIA) 60 MG/ML SOSY injection, Inject 60 mg into the skin every 6 (six) months. Administered at [facility] q13m, Disp: , Rfl:    ferrous sulfate 325 (65 FE) MG tablet, Take 325 mg by mouth daily with breakfast., Disp: , Rfl:    gabapentin (NEURONTIN) 100 MG capsule, Take 100 mg by mouth daily., Disp: , Rfl:    levothyroxine (SYNTHROID) 112 MCG tablet, Take 1 tablet (112 mcg total) by mouth daily., Disp: 90 tablet, Rfl: 3   lidocaine-prilocaine (EMLA) cream, Apply to affected area once, Disp: , Rfl:    loratadine (CLARITIN) 10 MG tablet, Take  10 mg by mouth as needed for allergies., Disp: , Rfl:    melatonin 5 MG TABS, Take 5 mg by mouth at bedtime., Disp: , Rfl:    nitroGLYCERIN (NITROSTAT) 0.4 MG SL tablet, DISSOLVE 1 TAB UNDER TONGUE FOR CHEST PAIN - IF PAIN REMAINS AFTER 5 MIN, CALL 911 AND REPEAT DOSE. MAX 3 TABS IN 15 MINUTES, Disp: 25 tablet, Rfl: 6   rosuvastatin (CRESTOR) 20 MG tablet, TAKE 1 TABLET BY MOUTH DAILY, Disp: 90 tablet, Rfl: 3   Study - Jessica([name]) - olpasiran (AMG 890) 142 mg/mL or placebo SQ injection (PI-Hilty), Inject 142 mg into the skin once. For investigational use only - Inject 1 mL (1 prefilled syringe) subcutaneously into appropriate injection site per protocol. (Approved injection site(s): upper arm, upper thigh, abdomen). Please contact [facility] if any questions., Disp: , Rfl:    VITAMIN D PO, Take 1 capsule by mouth daily., Disp: , Rfl:    ondansetron (ZOFRAN) 8 MG tablet, Take 1 tablet (8 mg total) by mouth every 8 (eight) hours as needed for nausea or vomiting. Start on the third day after chemotherapy. (Patient not taking: Reported on 11/09/2022), Disp: 30 tablet, Rfl: 1   oxyCODONE (OXY IR/ROXICODONE) 5 MG immediate release tablet, Take 1 tablet (5 mg total) by mouth every 6 (six) hours as needed for severe pain. (Patient not taking:  Reported on 11/03/2022), Disp: 15 tablet, Rfl: 0   prochlorperazine (COMPAZINE) 10 MG tablet, Take 1 tablet (10 mg total) by mouth every 6 (six) hours as needed for nausea or vomiting. (Patient not taking: Reported on 11/16/2022), Disp: 30 tablet, Rfl: 1  Current Facility-Administered Medications:    [START ON 05/11/2023] denosumab (PROLIA) injection 60 mg, 60 mg, Subcutaneous, Once, Chales Abrahams, Freddie Breech, MD   Study - Jessica([name]) - olpasiran (AMG 890) 142 mg/mL or placebo SQ injection (PI-Hilty), 142 mg, Subcutaneous, Once,   LOT # [id number]

## 2023-02-06 NOTE — Progress Notes (Signed)
Cardiology Office Note:    Date:  02/06/2023   ID:  Jessica [name], DOB [birth year], MRN [id number]  PCP:  Mahlon Gammon, MD   [facility] Providers Cardiologist:  Tonny Bollman, MD     Referring MD: Mahlon Gammon, MD   Chief Complaint  Patient presents with   Shortness of Breath    History of Present Illness:    Jessica [name] is a 82 y.o. female with a hx of coronary artery disease, presenting for follow-up evaluation.  Patient has a remote history of pericarditis in 2000.  Other medical problems include hyperlipidemia and hypothyroidism.  She presented with a non-ST elevation infarction in 2022 and was found to have subtotal occlusion of the LAD treated with a drug-eluting stent.  She initially had an LVEF in the 40 to 45% range but initially improved to an LVEF of 50 to 55% on follow-up echo assessment, then improved further on an echo in 2023 demonstrating an LVEF of 55 to 60% with no regional wall motion abnormalities.   The patient is here alone today.  Since I saw her last, she has been diagnosed with breast cancer and she is currently undergoing chemotherapy.  She is considering radiation therapy but as of now she is leaning against doing that.  I think she is going to meet with the radiation oncologist to make a final decision.  From a cardiac standpoint, she feels like she is doing okay.  She continues to have some exertional dyspnea and fatigue and she complained of the symptoms even before chemotherapy.  She states it is hard for her to know now whether it is chemotherapy related symptoms or symptoms that she has already had.  An echocardiogram was done earlier this year to evaluate her shortness of breath and it showed normal LV and RV function with no significant valvular disease.  She denies orthopnea, PND, chest pain, or leg swelling.  Current Medications: Current Meds  Medication Sig   acetaminophen (TYLENOL) 650 MG CR tablet Take 650 mg by mouth as  needed for pain.   Calcium Carb-Cholecalciferol (CALCIUM 600+D) 600-800 MG-UNIT TABS Take 3 tablets by mouth daily.   clopidogrel (PLAVIX) 75 MG tablet TAKE 1 TABLET BY MOUTH DAILY   denosumab (PROLIA) 60 MG/ML SOSY injection Inject 60 mg into the skin every 6 (six) months. Administered at [facility] q55m   ferrous sulfate 325 (65 FE) MG tablet Take 325 mg by mouth daily with breakfast.   gabapentin (NEURONTIN) 100 MG capsule Take 100 mg by mouth daily.   levothyroxine (SYNTHROID) 112 MCG tablet Take 1 tablet (112 mcg total) by mouth daily.   lidocaine-prilocaine (EMLA) cream Apply 1 Application topically as needed. 2.5-2.5% as needed.   loratadine (CLARITIN) 10 MG tablet Take 10 mg by mouth as needed for allergies.   melatonin 5 MG TABS Take 5 mg by mouth at bedtime.   nitroGLYCERIN (NITROSTAT) 0.4 MG SL tablet DISSOLVE 1 TAB UNDER TONGUE FOR CHEST PAIN - IF PAIN REMAINS AFTER 5 MIN, CALL 911 AND REPEAT DOSE. MAX 3 TABS IN 15 MINUTES   ondansetron (ZOFRAN) 8 MG tablet Take 1 tablet (8 mg total) by mouth every 8 (eight) hours as needed for nausea or vomiting. Start on the third day after chemotherapy.   oxyCODONE (OXY IR/ROXICODONE) 5 MG immediate release tablet Take 1 tablet (5 mg total) by mouth every 6 (six) hours as needed for severe pain.   prochlorperazine (COMPAZINE) 10 MG tablet Take  1 tablet (10 mg total) by mouth every 6 (six) hours as needed for nausea or vomiting.   rosuvastatin (CRESTOR) 20 MG tablet TAKE 1 TABLET BY MOUTH DAILY   Study - [name] - olpasiran (AMG 890) 142 mg/mL or placebo SQ injection (PI-Hilty) Inject 142 mg into the skin once. For investigational use only - Inject 1 mL (1 prefilled syringe) subcutaneously into appropriate injection site per protocol. (Approved injection site(s): upper arm, upper thigh, abdomen). Please contact [facility] if any questions.   VITAMIN D PO Take 1 capsule by mouth daily.   [DISCONTINUED] ascorbic acid (VITAMIN  C) 500 MG tablet Take 500 mg by mouth daily.   [DISCONTINUED] lidocaine-prilocaine (EMLA) cream Apply to affected area once   Current Facility-Administered Medications for the 02/06/23 encounter (Office Visit) with Tonny Bollman, MD  Medication   [START ON 05/11/2023] denosumab (PROLIA) injection 60 mg     Allergies:   Patient has no known allergies.   ROS:   Please see the history of present illness.    All other systems reviewed and are negative.  EKGs/Labs/Other Studies Reviewed:    The following studies were reviewed today: Cardiac Studies & Procedures   CARDIAC CATHETERIZATION  CARDIAC CATHETERIZATION 09/01/2020  Narrative  Dist LAD lesion is 75% stenosed.   Subtotal occlusion of the mid LAD with distal TIMI grade II flow.  Stenosis is a [place] 010 bifurcation stenosis with the first diagonal.  Successful angioplasty and stenting using a 22 x 2.0 mm Onyx postdilated to 2.25 mm in diameter, avoiding jailing the larger first diagonal.  0% stenosis with TIMI grade III flow was noted.  Tandem 50 and 40% proximal and mid RCA stenoses.  Right coronary is codominant.  Left main is widely patent.  Circumflex is widely patent.  The circumflex and obtuse marginals are tortuous.  Apical severe hypokinesis/akinesis.  LVEDP 6 mmHg.   RECOMMENDATIONS:   Aspirin and Brilinta for 12 months.  Aggressive risk factor modification  High intensity statin therapy  Findings Coronary Findings Diagnostic  Dominance: Co-dominant  Left Anterior Descending Vessel is small. Mid LAD lesion is 99% stenosed. Dist LAD lesion is 75% stenosed.  First Diagonal Branch Vessel is small in size.  Right Coronary Artery Prox RCA-1 lesion is 40% stenosed. Prox RCA-2 lesion is 50% stenosed.  Intervention  Mid LAD lesion Stent A stent was successfully placed. Post-Intervention Lesion Assessment The intervention was successful. There is a 0% residual stenosis post intervention.      ECHOCARDIOGRAM  ECHOCARDIOGRAM COMPLETE 08/29/2022  Narrative ECHOCARDIOGRAM REPORT    Patient Name:   Jessica [name] Date of Exam: 08/29/2022 Medical Rec #:  [id number]         Height:       63.0 in Accession #:    [id number]        Weight:       157.8 lb Date of Birth:  April 11, 1940         BSA:          1.748 m Patient Age:    82 years          BP:           119/64 mmHg Patient Gender: F                 HR:           59 bpm. Exam Location:  [address]  Procedure: 2D Echo, Cardiac Doppler, Color Doppler and 3D Echo  Indications:  Dyspnea R06.00  History:        Patient has prior history of Echocardiogram examinations, most recent 05/14/2021. CAD, Signs/Symptoms:DOE; Risk Factors:Dyslipidemia.  Sonographer:    Thurman Coyer RDCS Referring Phys: [phone] Tishie Altmann  IMPRESSIONS   1. Left ventricular ejection fraction, by estimation, is 60 to 65%. The left ventricle has normal function. The left ventricle has no regional wall motion abnormalities. Left ventricular diastolic parameters were normal. 2. Right ventricular systolic function is low normal. The right ventricular size is normal. There is normal pulmonary artery systolic pressure. The estimated right ventricular systolic pressure is 23.6 mmHg. 3. The mitral valve is normal in structure. Trivial mitral valve regurgitation. No evidence of mitral stenosis. 4. The aortic valve was not well visualized. Aortic valve regurgitation is trivial. No aortic stenosis is present. 5. The inferior vena cava is normal in size with greater than 50% respiratory variability, suggesting right atrial pressure of 3 mmHg.  FINDINGS Left Ventricle: Left ventricular ejection fraction, by estimation, is 60 to 65%. The left ventricle has normal function. The left ventricle has no regional wall motion abnormalities. The left ventricular internal cavity size was small. There is no left ventricular hypertrophy. Left ventricular diastolic  parameters were normal.  Right Ventricle: The right ventricular size is normal. No increase in right ventricular wall thickness. Right ventricular systolic function is low normal. There is normal pulmonary artery systolic pressure. The tricuspid regurgitant velocity is 2.27 m/s, and with an assumed right atrial pressure of 3 mmHg, the estimated right ventricular systolic pressure is 23.6 mmHg.  Left Atrium: Left atrial size was normal in size.  Right Atrium: Right atrial size was normal in size.  Pericardium: There is no evidence of pericardial effusion.  Mitral Valve: The mitral valve is normal in structure. Trivial mitral valve regurgitation. No evidence of mitral valve stenosis.  Tricuspid Valve: The tricuspid valve is normal in structure. Tricuspid valve regurgitation is trivial.  Aortic Valve: The aortic valve was not well visualized. Aortic valve regurgitation is trivial. No aortic stenosis is present.  Pulmonic Valve: The pulmonic valve was not well visualized. Pulmonic valve regurgitation is not visualized.  Aorta: The aortic root is normal in size and structure.  Venous: The inferior vena cava is normal in size with greater than 50% respiratory variability, suggesting right atrial pressure of 3 mmHg.  IAS/Shunts: The interatrial septum was not well visualized.   LEFT VENTRICLE PLAX 2D LVIDd:         3.40 cm   Diastology LVIDs:         2.30 cm   LV e' medial:    7.62 cm/s LV PW:         0.90 cm   LV E/e' medial:  7.9 LV IVS:        0.70 cm   LV e' lateral:   8.59 cm/s LVOT diam:     2.20 cm   LV E/e' lateral: 7.0 LV SV:         93 LV SV Index:   53 LVOT Area:     3.80 cm  3D Volume EF: 3D EF:        68 % LV EDV:       77 ml LV ESV:       24 ml LV SV:        53 ml  RIGHT VENTRICLE RV Basal diam:  3.20 cm RV Mid diam:    3.00 cm RV S prime:     9.68  cm/s TAPSE (M-mode): 1.5 cm  LEFT ATRIUM             Index        RIGHT ATRIUM           Index LA Vol (A2C):    47.2 ml 27.00 ml/m  RA Area:     14.60 cm LA Vol (A4C):   46.0 ml 26.31 ml/m  RA Volume:   34.60 ml  19.79 ml/m LA Biplane Vol: 47.6 ml 27.23 ml/m AORTIC VALVE LVOT Vmax:   95.90 cm/s LVOT Vmean:  63.400 cm/s LVOT VTI:    0.245 m  AORTA Ao Root diam: 3.10 cm  MITRAL VALVE               TRICUSPID VALVE MV Area (PHT): 2.69 cm    TR Peak grad:   20.6 mmHg MV Decel Time: 282 msec    TR Vmax:        227.00 cm/s MV E velocity: 60.30 cm/s MV A velocity: 78.10 cm/s  SHUNTS MV E/A ratio:  0.77        Systemic VTI:  0.24 m Systemic Diam: 2.20 cm  Epifanio Lesches MD Electronically signed by Epifanio Lesches MD Signature Date/Time: 08/29/2022/9:46:18 PM    Final             EKG:        Recent Labs: 05/30/2022: TSH 1.09 01/25/2023: ALT 13; BUN 14; Creatinine 0.61; Hemoglobin 13.0; Platelet Count 262; Potassium 4.2; Sodium 139  Recent Lipid Panel    Component Value Date/Time   CHOL 150 05/30/2022 0811   CHOL 149 03/16/2021 0920   TRIG 90 05/30/2022 0811   HDL 58 05/30/2022 0811   HDL 56 03/16/2021 0920   CHOLHDL 2.6 05/30/2022 0811   VLDL 18 02/24/2014 0926   LDLCALC 75 05/30/2022 0811   LDLDIRECT 135.4 12/26/2006 0847     Risk Assessment/Calculations:                Physical Exam:    VS:  BP 120/70   Pulse 63   Ht 5\' 3"  (1.6 m)   Wt 158 lb 12.8 oz (72 kg)   SpO2 95%   BMI 28.13 kg/m     Wt Readings from Last 3 Encounters:  02/06/23 158 lb 12.8 oz (72 kg)  02/06/23 156 lb (70.8 kg)  01/25/23 155 lb 9.6 oz (70.6 kg)     GEN:  Well nourished, well developed in no acute distress HEENT: Normal NECK: No JVD; No carotid bruits LYMPHATICS: No lymphadenopathy CARDIAC: RRR, no murmurs, rubs, gallops RESPIRATORY:  Clear to auscultation without rales, wheezing or rhonchi  ABDOMEN: Soft, non-tender, non-distended MUSCULOSKELETAL:  No edema; No deformity  SKIN: Warm and dry NEUROLOGIC:  Alert and oriented x 3 PSYCHIATRIC:  Normal affect    Assessment & Plan Coronary artery disease involving native coronary artery of native heart without angina pectoris The patient appears to be getting along well with no anginal symptoms.  She will continue on clopidogrel for antiplatelet therapy and rosuvastatin for lipid lowering.  Her blood pressure is well-controlled.  No indication for beta-blocker.  Normal LV function by echo earlier this year. Mixed hyperlipidemia Treated with rosuvastatin and also participating in the [name] trial.  Last lipids that I have on file from March of this year show an LDL of 75.  Goal LDL cholesterol is less than 70.  Will update labs at the time of her next visit.  Medication Adjustments/Labs and Tests Ordered: Current medicines are reviewed at length with the patient today.  Concerns regarding medicines are outlined above.  No orders of the defined types were placed in this encounter.  No orders of the defined types were placed in this encounter.   Patient Instructions  Follow-Up: At [facility], you and your health needs are our priority.  As part of our continuing mission to provide you with exceptional heart care, we have created designated Provider Care Teams.  These Care Teams include your primary Cardiologist (physician) and Advanced Practice Providers (APPs -  Physician Assistants and Nurse Practitioners) who all work together to provide you with the care you need, when you need it.  We recommend signing up for the patient portal called "MyChart".  Sign up information is provided on this After Visit Summary.  MyChart is used to connect with patients for Virtual Visits (Telemedicine).  Patients are able to view lab/test results, encounter notes, upcoming appointments, etc.  Non-urgent messages can be sent to your provider as well.   To learn more about what you can do with MyChart, go to [email or web address].    Your next appointment:   6 month(s)  Provider:   Tonny Bollman, MD        Signed, Tonny Bollman, MD  02/06/2023 5:52 PM    [facility]

## 2023-02-06 NOTE — Patient Instructions (Signed)
Follow-Up: At [facility], you and your health needs are our priority.  As part of our continuing mission to provide you with exceptional heart care, we have created designated Provider Care Teams.  These Care Teams include your primary Cardiologist (physician) and Advanced Practice Providers (APPs -  Physician Assistants and Nurse Practitioners) who all work together to provide you with the care you need, when you need it.  We recommend signing up for the patient portal called "MyChart".  Sign up information is provided on this After Visit Summary.  MyChart is used to connect with patients for Virtual Visits (Telemedicine).  Patients are able to view lab/test results, encounter notes, upcoming appointments, etc.  Non-urgent messages can be sent to your provider as well.   To learn more about what you can do with MyChart, go to [email or web address].    Your next appointment:   6 month(s)  Provider:   Tonny Bollman, MD

## 2023-02-08 NOTE — Research (Addendum)
Are there any labs that are clinically significant?  Yes []  OR No[x]   Chrystie Nose, MD, Milagros Loll  [place]  [facility]  Medical Director of the Advanced Lipid Disorders &  Cardiovascular Risk Reduction Clinic Diplomate of the American Board of Clinical Lipidology Attending Cardiologist  Direct Dial: [phone]  Fax: [phone]  Website:  [email or web address]  Please FORWARD back to me with any changes or follow up!    ACCESSION NO. [id number]                               Page 1 of 1                                                        INVESTIGATOR: ([id number])                          PROTOCOL   [id number]                     Zoila Shutter M.D.                             INVESTIGATOR NO.: [id number]                     c/o Claretta Fraise                              SUBJECT ID: [id number]                     [facility]                 SUBJECT INITIALS NOT COLLECTED:                     [address]                 VISIT: [place], [place] [id number]                   SPONSOR REPORT TO:                 COLLECTION TIME:10:30 DATE:06-Feb-2023                     Mertha Baars                      DATE RECEIVED IN LABORATORY: 07-Feb-2023                     c/o Sponsor(or Addtl) [facility].Study DATE REPORTED BY LABORATORY: 08-Feb-2023                     [facility]                          SEX: F  BIRTHDATE:  04-Sep-1940    AGE: 28O  [address]                  Wallis Bamberg NO.: [address] [place] [address]                                                                                        Ref. Ranges               Clinical    Comments                                                                          Significance                                                                            Yes*  No                    ALT > 3 X ULN                       ALT>3XULN      Criteria not met                                        ALT > 5 X ULN                      ALT>5XULN      Criteria not met                                        ALT > 8 X ULN                      ALT>8XULN      Criteria not met                                        ALT & TBIL                      ALT & TBIL     Criteria not met  AST > 3 X ULN                      AST>3XULN      Criteria not met                                        AST > 5 X ULN                      AST>5XULN      Criteria not met                                        AST > 8 X ULN                      AST>8XULN      Criteria not met                                        AST & TBIL                      AST & TBIL     Criteria not met        EGFR                      CKDEPI eGF     92           mL/min/1.53m2                                                            No Ref Rng      CHEMISTRY PANEL                      Total Bili     0.4          0.2-1.2 mg/dL                                Dir Bili       0.1          0.0-0.4 mg/dL                                Ind Bili       0.3          0.0-1.2 mg/dL                                Alk Phos       58           35-104 U/L                                   ALT (  SGPT)     14           4-43 U/L                                    AST (SGOT)     16           8-40 U/L                                    Urea Nitr      12           4-34 mg/dL                                   Creatinine     0.56         0.35-1.45 mg/dL                              Calcium        8.8          8.3-10.6 mg/dL                              Total Prot     5.7     L    6.0-8.0 g/dL    =  6.2 drawn 16/12/9602                         Alb BCG        3.9          3.0-4.6 g/dL                                 CK             31           26-192 U/L                                   Sodium         140          135-145 mEq/L                                 Potassium      4.1          3.5-5.2 mEq/L                                Bicarb         25.4         19.3-29.3 mEq/L                              Chloride       104          94-112 mEq/L  ADJUSTED CALCIUM                      Adj Calc       8.9          8.3-10.6 mg/dL                     ANION GAP                      Anion Gap      15           7-18 mEq/L                      IS SUBJECT FASTING?                      Fasting?       Yes

## 2023-02-11 ENCOUNTER — Encounter: Payer: Self-pay | Admitting: Hematology and Oncology

## 2023-02-15 ENCOUNTER — Inpatient Hospital Stay: Payer: Medicare Other

## 2023-02-15 ENCOUNTER — Inpatient Hospital Stay: Payer: Medicare Other | Attending: Hematology and Oncology

## 2023-02-15 ENCOUNTER — Other Ambulatory Visit: Payer: Self-pay | Admitting: *Deleted

## 2023-02-15 ENCOUNTER — Inpatient Hospital Stay (HOSPITAL_BASED_OUTPATIENT_CLINIC_OR_DEPARTMENT_OTHER): Payer: Medicare Other | Admitting: Hematology and Oncology

## 2023-02-15 VITALS — BP 134/61 | HR 65 | Temp 98.2°F | Resp 18 | Ht 63.0 in | Wt 155.1 lb

## 2023-02-15 DIAGNOSIS — R5383 Other fatigue: Secondary | ICD-10-CM | POA: Diagnosis not present

## 2023-02-15 DIAGNOSIS — C50412 Malignant neoplasm of upper-outer quadrant of left female breast: Secondary | ICD-10-CM | POA: Diagnosis not present

## 2023-02-15 DIAGNOSIS — Z171 Estrogen receptor negative status [ER-]: Secondary | ICD-10-CM

## 2023-02-15 DIAGNOSIS — Z9013 Acquired absence of bilateral breasts and nipples: Secondary | ICD-10-CM | POA: Insufficient documentation

## 2023-02-15 DIAGNOSIS — Z5111 Encounter for antineoplastic chemotherapy: Secondary | ICD-10-CM | POA: Insufficient documentation

## 2023-02-15 LAB — CBC WITH DIFFERENTIAL (CANCER CENTER ONLY)
Abs Immature Granulocytes: 0.08 10*3/uL — ABNORMAL HIGH (ref 0.00–0.07)
Basophils Absolute: 0.1 10*3/uL (ref 0.0–0.1)
Basophils Relative: 1 %
Eosinophils Absolute: 0.3 10*3/uL (ref 0.0–0.5)
Eosinophils Relative: 8 %
HCT: 36.7 % (ref 36.0–46.0)
Hemoglobin: 12.6 g/dL (ref 12.0–15.0)
Immature Granulocytes: 2 %
Lymphocytes Relative: 34 %
Lymphs Abs: 1.5 10*3/uL (ref 0.7–4.0)
MCH: 33.7 pg (ref 26.0–34.0)
MCHC: 34.3 g/dL (ref 30.0–36.0)
MCV: 98.1 fL (ref 80.0–100.0)
Monocytes Absolute: 0.6 10*3/uL (ref 0.1–1.0)
Monocytes Relative: 15 %
Neutro Abs: 1.7 10*3/uL (ref 1.7–7.7)
Neutrophils Relative %: 40 %
Platelet Count: 242 10*3/uL (ref 150–400)
RBC: 3.74 MIL/uL — ABNORMAL LOW (ref 3.87–5.11)
RDW: 13.6 % (ref 11.5–15.5)
WBC Count: 4.3 10*3/uL (ref 4.0–10.5)
nRBC: 0 % (ref 0.0–0.2)

## 2023-02-15 LAB — CMP (CANCER CENTER ONLY)
ALT: 15 U/L (ref 0–44)
AST: 16 U/L (ref 15–41)
Albumin: 4.1 g/dL (ref 3.5–5.0)
Alkaline Phosphatase: 58 U/L (ref 38–126)
Anion gap: 5 (ref 5–15)
BUN: 15 mg/dL (ref 8–23)
CO2: 27 mmol/L (ref 22–32)
Calcium: 9.2 mg/dL (ref 8.9–10.3)
Chloride: 108 mmol/L (ref 98–111)
Creatinine: 0.6 mg/dL (ref 0.44–1.00)
GFR, Estimated: >60 mL/min (ref >60)
Glucose, Bld: 111 mg/dL — ABNORMAL HIGH (ref 70–99)
Potassium: 4.1 mmol/L (ref 3.5–5.1)
Sodium: 140 mmol/L (ref 135–145)
Total Bilirubin: 0.5 mg/dL (ref <1.2)
Total Protein: 6.4 g/dL — ABNORMAL LOW (ref 6.5–8.1)

## 2023-02-15 MED ORDER — DEXAMETHASONE SODIUM PHOSPHATE 10 MG/ML IJ SOLN
10.0000 mg | Freq: Once | INTRAMUSCULAR | Status: AC
  Administered 2023-02-15: 10 mg via INTRAVENOUS
  Filled 2023-02-15: qty 1

## 2023-02-15 MED ORDER — FLUOROURACIL CHEMO INJECTION 2.5 GM/50ML
600.0000 mg/m2 | Freq: Once | INTRAVENOUS | Status: AC
  Administered 2023-02-15: 1000 mg via INTRAVENOUS
  Filled 2023-02-15: qty 20

## 2023-02-15 MED ORDER — SODIUM CHLORIDE 0.9 % IV SOLN
600.0000 mg/m2 | Freq: Once | INTRAVENOUS | Status: AC
  Administered 2023-02-15: 1000 mg via INTRAVENOUS
  Filled 2023-02-15: qty 50

## 2023-02-15 MED ORDER — SODIUM CHLORIDE 0.9 % IV SOLN: Freq: Once | INTRAVENOUS | Status: AC

## 2023-02-15 MED ORDER — SODIUM CHLORIDE 0.9% FLUSH
10.0000 mL | Freq: Once | INTRAVENOUS | Status: AC
  Administered 2023-02-15: 10 mL

## 2023-02-15 MED ORDER — PALONOSETRON HCL INJECTION 0.25 MG/5ML
0.2500 mg | Freq: Once | INTRAVENOUS | Status: AC
  Administered 2023-02-15: 0.25 mg via INTRAVENOUS
  Filled 2023-02-15: qty 5

## 2023-02-15 MED ORDER — SODIUM CHLORIDE 0.9% FLUSH
10.0000 mL | INTRAVENOUS | Status: DC | PRN
  Administered 2023-02-15: 10 mL

## 2023-02-15 MED ORDER — METHOTREXATE SODIUM CHEMO INJECTION (PF) 50 MG/2ML
40.0000 mg/m2 | Freq: Once | INTRAMUSCULAR | Status: AC
  Administered 2023-02-15: 69.5 mg via INTRAVENOUS
  Filled 2023-02-15: qty 2.78

## 2023-02-15 MED ORDER — HEPARIN SOD (PORK) LOCK FLUSH 100 UNIT/ML IV SOLN
500.0000 [IU] | Freq: Once | INTRAVENOUS | Status: AC | PRN
  Administered 2023-02-15: 500 [IU]

## 2023-02-15 NOTE — Progress Notes (Signed)
error 

## 2023-02-15 NOTE — Progress Notes (Signed)
Patient Care Team: Mahlon Gammon, MD as PCP - General (Internal Medicine) Tonny Bollman, MD as PCP - Cardiology (Cardiology) Bernette Redbird, MD as Consulting Physician (Gastroenterology) Antony Contras, MD as Consulting Physician (Ophthalmology) [place], Amy, MD as Consulting Physician (Dermatology) Mast, Man X, NP as Nurse Practitioner (Internal Medicine) Pershing Proud, RN as Oncology Nurse Navigator Donnelly Angelica, RN as Oncology Nurse Navigator Serena Croissant, MD as Consulting Physician (Hematology and Oncology) Harriette Bouillon, MD as Consulting Physician (General Surgery)  DIAGNOSIS:  Encounter Diagnosis  Name Primary?   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 Indeterminate 1.2 cm right breast mass    09/30/2022 Initial Biopsy   Right breast biopsy demonstrates intraductal papilloma and no evidence of atypia or malignancy.   10/18/2022 Surgery   Left mastectomy scheduled with Dr. 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 She reports fatigue and hair thinning as the main side effects, but otherwise tolerates the treatment well. She denies nausea, mouth sores, and changes in bowel habits. 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 The patient is also considering radiation therapy after chemotherapy, but expresses concerns about the potential benefits given her age and the possibility of cancer recurrence. She also mentions a dental appointment scheduled for February and a desire to volunteer at the hospital, but is unsure if she can due to her recent cancer treatment.         ALLERGIES:  has No Known Allergies.  MEDICATIONS:  Current Outpatient Medications  Medication Sig Dispense Refill   acetaminophen (TYLENOL) 650 MG CR tablet Take 650 mg by mouth as needed for pain.     Calcium Carb-Cholecalciferol (CALCIUM 600+D) 600-800 MG-UNIT TABS Take 3 tablets by mouth daily.     clopidogrel (PLAVIX) 75 MG tablet TAKE 1 TABLET BY MOUTH DAILY 90 tablet 1   denosumab (PROLIA) 60 MG/ML SOSY injection Inject 60 mg into the skin every 6 (six) months. Administered at [facility] q40m     ferrous sulfate 325 (65 FE) MG tablet Take 325 mg by mouth daily with  breakfast.     gabapentin (NEURONTIN) 100 MG capsule Take 100 mg by mouth daily.     levothyroxine (SYNTHROID) 112 MCG tablet Take 1 tablet (112 mcg total) by mouth daily. 90 tablet 3   lidocaine-prilocaine (EMLA) cream Apply 1 Application topically as needed. 2.5-2.5% as needed.     loratadine (CLARITIN) 10  MG tablet Take 10 mg by mouth as needed for allergies.     melatonin 5 MG TABS Take 5 mg by mouth at bedtime.     nitroGLYCERIN (NITROSTAT) 0.4 MG SL tablet DISSOLVE 1 TAB UNDER TONGUE FOR CHEST PAIN - IF PAIN REMAINS AFTER 5 MIN, CALL 911 AND REPEAT DOSE. MAX 3 TABS IN 15 MINUTES 25 tablet 6   ondansetron (ZOFRAN) 8 MG tablet Take 1 tablet (8 mg total) by mouth every 8 (eight) hours as needed for nausea or vomiting. Start on the third day after chemotherapy. 30 tablet 1   oxyCODONE (OXY IR/ROXICODONE) 5 MG immediate release tablet Take 1 tablet (5 mg total) by mouth every 6 (six) hours as needed for severe pain. 15 tablet 0   prochlorperazine (COMPAZINE) 10 MG tablet Take 1 tablet (10 mg total) by mouth every 6 (six) hours as needed for nausea or vomiting. 30 tablet 1   rosuvastatin (CRESTOR) 20 MG tablet TAKE 1 TABLET BY MOUTH DAILY 90 tablet 3   Study - [name] - olpasiran (AMG 890) 142 mg/mL or placebo SQ injection (PI-Hilty) Inject 142 mg into the skin once. For investigational use only - Inject 1 mL (1 prefilled syringe) subcutaneously into appropriate injection site per protocol. (Approved injection site(s): upper arm, upper thigh, abdomen). Please contact [facility] if any questions.     VITAMIN D PO Take 1 capsule by mouth daily.     Current Facility-Administered Medications  Medication Dose Route Frequency Provider Last Rate Last Admin   [START ON 05/11/2023] denosumab (PROLIA) injection 60 mg  60 mg Subcutaneous Once Mahlon Gammon, MD        PHYSICAL EXAMINATION: ECOG PERFORMANCE STATUS: 1 - Symptomatic but completely ambulatory  Vitals:   02/15/23 1114  BP: 134/61  Pulse: 65  Resp: 18  Temp: 98.2 F (36.8 C)  SpO2: 95%   Filed Weights   02/15/23 1114  Weight: 155 lb 1.6 oz (70.4 kg)    Physical Exam          (exam performed in the presence of a chaperone)  LABORATORY DATA:  I have reviewed the data as listed    Latest Ref Rng & Units 02/15/2023    10:57 AM 01/25/2023   10:20 AM 01/04/2023   11:26 AM  CMP  Glucose 70 - 99 mg/dL 782  82  93   BUN 8 - 23 mg/dL 15  14  12    Creatinine 0.44 - 1.00 mg/dL 9.56  2.13  0.86   Sodium 135 - 145 mmol/L 140  139  139   Potassium 3.5 - 5.1 mmol/L 4.1  4.2  4.0   Chloride 98 - 111 mmol/L 108  107  108   CO2 22 - 32 mmol/L 27  26  26    Calcium 8.9 - 10.3 mg/dL 9.2  9.0  8.6   Total Protein 6.5 - 8.1 g/dL 6.4  6.6  6.2   Total Bilirubin <1.2 mg/dL 0.5  0.5  0.5   Alkaline Phos 38 - 126 U/L 58  57  50   AST 15 - 41 U/L 16  18  14  ALT 0 - 44 U/L 15  13  13      Lab Results  Component Value Date   WBC 4.3 02/15/2023   HGB 12.6 02/15/2023   HCT 36.7 02/15/2023   MCV 98.1 02/15/2023   PLT 242 02/15/2023   NEUTROABS 1.7 02/15/2023    ASSESSMENT & PLAN:  Malignant neoplasm of upper-outer quadrant of left breast in female, estrogen receptor negative (HCC) 09/13/2022: Screening mammogram detected spiculated left breast mass upper outer quadrant 5 o'clock position measuring 2.1 cm, axilla negative, 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  Indeterminate 1.2 cm right breast mass   Recommendation: 10/18/2022:Left mastectomy: Grade 2 invasive lobular cancer 2.7 cm with DCIS, margins negative, suspicious for LVI, ER 0%, PR 0%, HER2 negative, Ki-67 5%, 0/7 lymph nodes Right mastectomy: Benign adjuvant CMF x 6 cycles Adjuvant radiation therapy _______________________________________________________________________________________________________________________________________________________________   Current treatment: CMF cycle 5   She is tolerating treatment well.   Treatment related fatigue: managing with energy conservation We reviewed diet, exercise, and lifestyle recommendations during chemotherapy Her labs are stable she will  proceed with treatment without dose modifications.     RTC in 3 weeks for labs, f/u, and her next treatment.  ------------------------------------- Assessment and Plan    Triple Negative Breast Cancer Patient is tolerating CMF chemotherapy well with fatigue and hair thinning as main side effects. No nausea or mouth sores reported. 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 Patient also reports "chemo brain" but no significant impact on daily life. -Continue with final round of CMF chemotherapy on January 8th, 2025. -Monitor for progression of neuropathy. -Consider cognitive exercises or strategies to manage "chemo brain" symptoms.  Post-Chemotherapy Plan Discussion about potential radiation therapy after chemotherapy completion. Patient has concerns about potential recurrence and the benefits of radiation therapy at her age. -Schedule consultation with radiation oncology after last round of chemotherapy. -Discuss shorter radiation treatments (once a week for five weeks) as a potential option.  Port Removal Port to be removed after last chemotherapy treatment. -Notify Dr. Mignon Pine to schedule port removal after January 8th, 2025.  General Health Maintenance Patient expressed interest in volunteering at the hospital and traveling to [facility]. -Advise patient to inquire about hospital's policy on volunteering post-treatment. -Advise patient that medically there is no contraindication to travel, but to consider physical stamina and potential fatigue post-treatment.          No orders of the defined types were placed in this encounter.  The patient has a good understanding of the overall plan. she agrees with it. she will call with any problems that may develop before the next visit here. Total time spent: 30 mins including face to face time and time spent for planning, charting and co-ordination of care   Tamsen Meek, MD 02/15/23

## 2023-02-15 NOTE — Assessment & Plan Note (Signed)
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  Indeterminate 1.2 cm right breast mass   Recommendation: 10/18/2022:Left mastectomy: Grade 2 invasive lobular cancer 2.7 cm with DCIS, margins negative, suspicious for LVI, ER 0%, PR 0%, HER2 negative, Ki-67 5%, 0/7 lymph nodes Right mastectomy: Benign adjuvant CMF x 6 cycles Adjuvant radiation therapy _______________________________________________________________________________________________________________________________________________________________   Current treatment: CMF cycle 5   She is tolerating treatment well.   Treatment related fatigue: managing with energy conservation We reviewed diet, exercise, and lifestyle recommendations during chemotherapy Her labs are stable she will proceed with treatment without dose modifications.     RTC in 3 weeks for labs, f/u, and her next treatment.

## 2023-02-22 ENCOUNTER — Telehealth: Payer: Self-pay | Admitting: *Deleted

## 2023-02-22 NOTE — Telephone Encounter (Signed)
Received call from pt with complaint of bilateral eye irritation and blurry vision x several weeks. Pt denies any dizziness, double vision, or headache at this time. Pt educated to f/u with opthalmology for eye exam.  If opthalmology is unable to see pt within a week, pt educated to contact our office for [facility] visit.  Pt verbalized understanding.

## 2023-02-27 ENCOUNTER — Encounter: Payer: Self-pay | Admitting: Hematology and Oncology

## 2023-03-14 ENCOUNTER — Encounter: Payer: Self-pay | Admitting: *Deleted

## 2023-03-14 IMAGING — US US THYROID
1 series · 14 of 25 positions shown · non-contrast
Comparison: None.

CLINICAL DATA: Acquired hypothyroidism

EXAM:
THYROID ULTRASOUND
TECHNIQUE: Ultrasound examination of the thyroid gland and adjacent soft
tissues was performed.

[Series 1: us thyroid · 29 acquisitions, 14 frames shown]
[im 1/29]
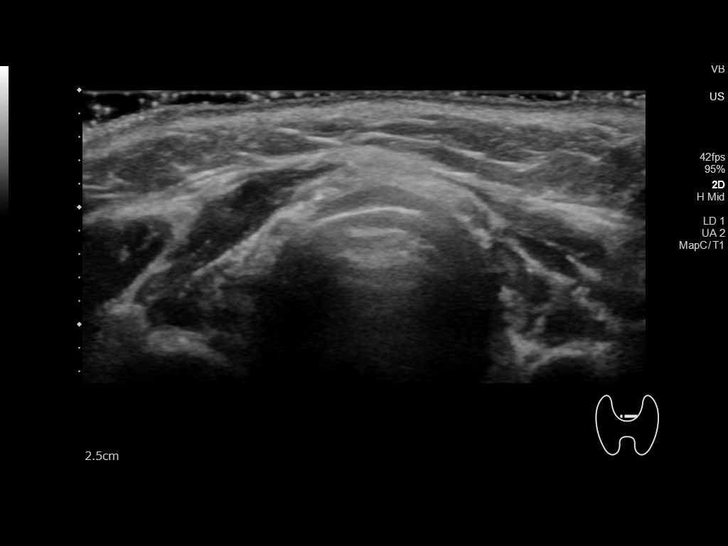
[im 3/29]
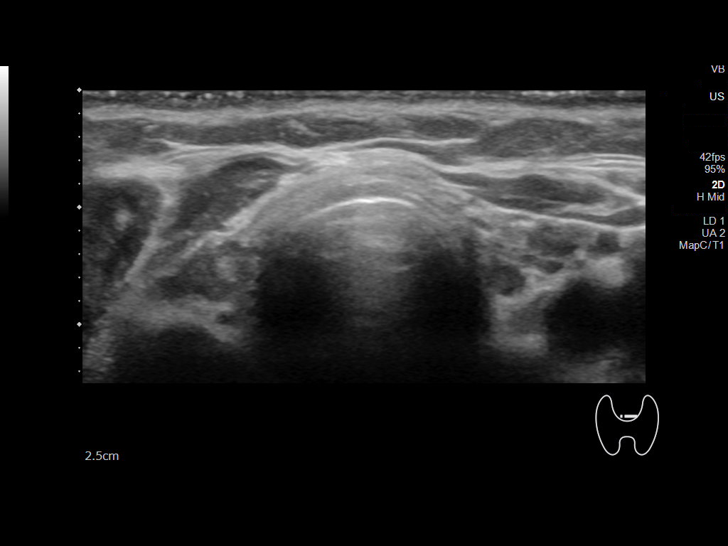
[im 5/29]
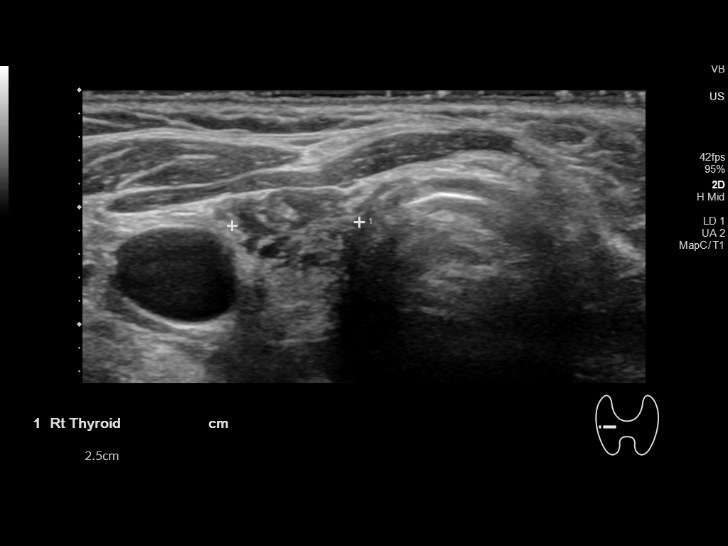
[im 8/29]
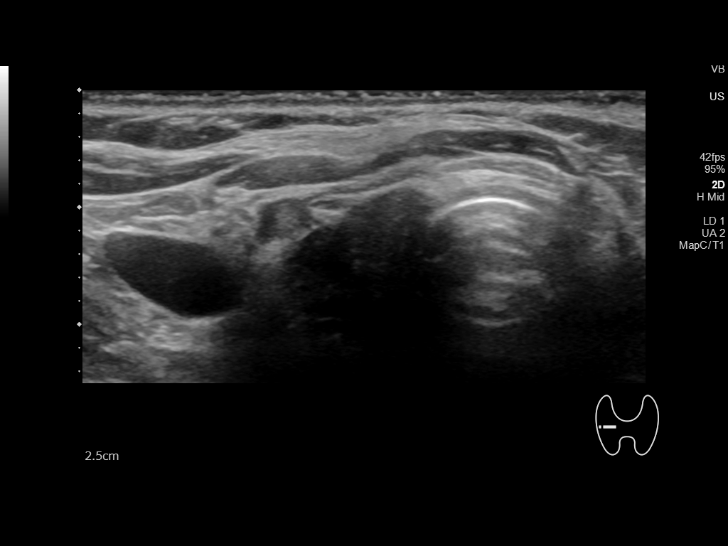
[im 10/29]
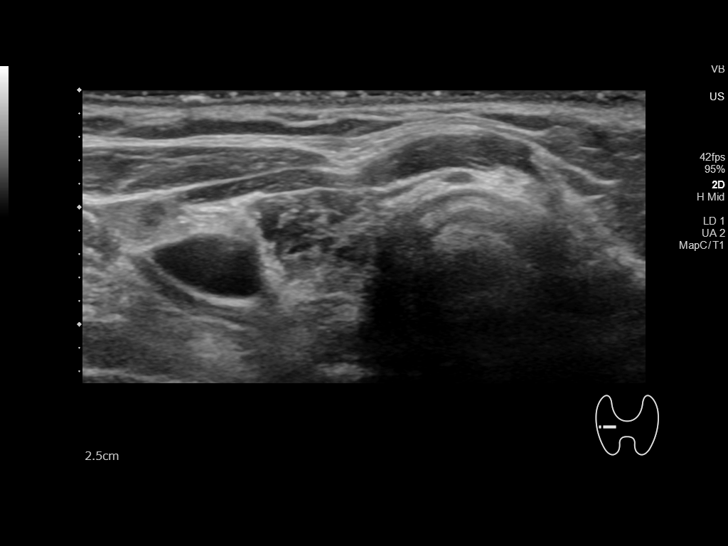
[im 11/29]
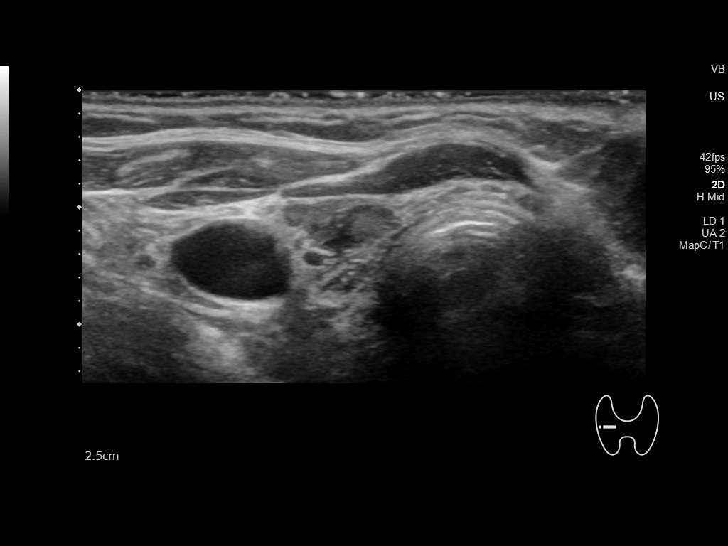
[im 13/29]
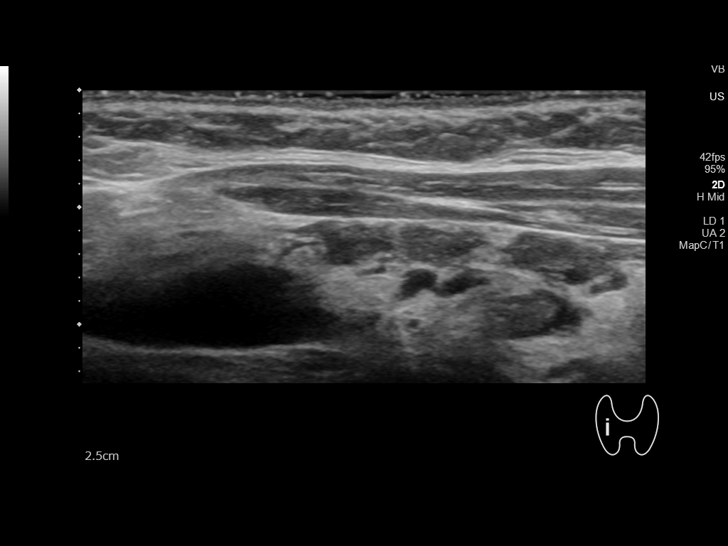
[im 16/29]
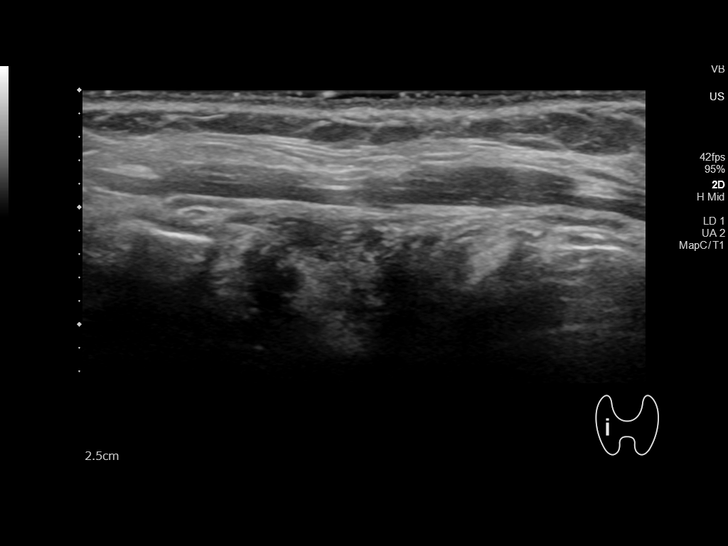
[im 18/29]
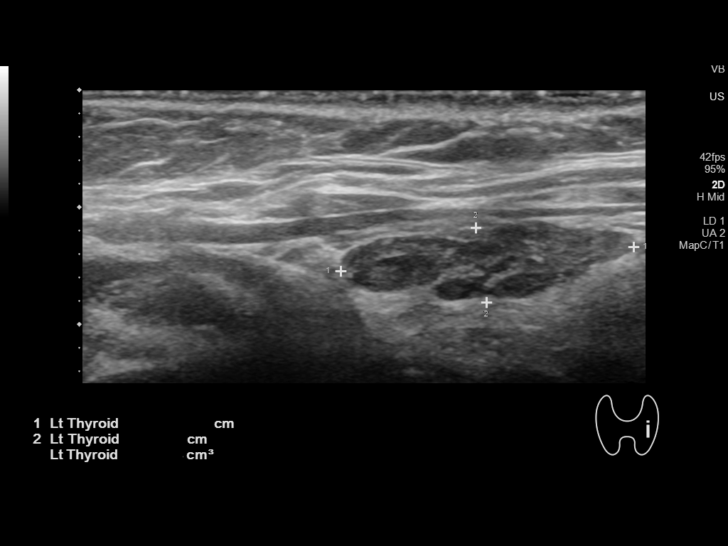
[im 19/29]
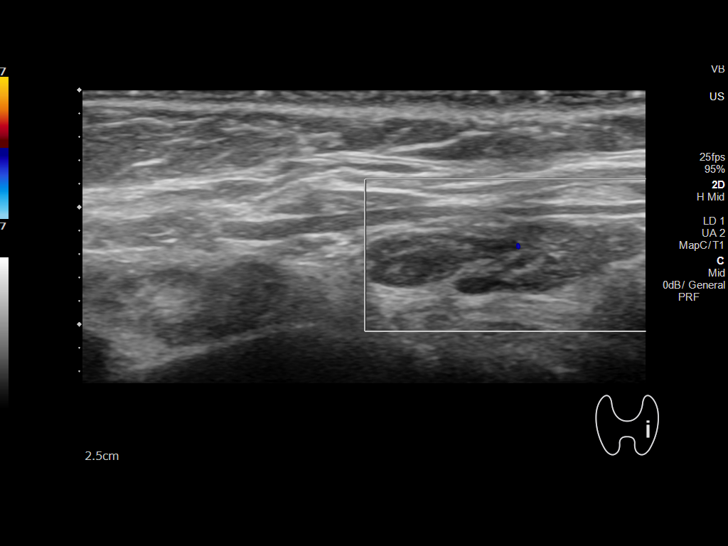
[im 22/29]
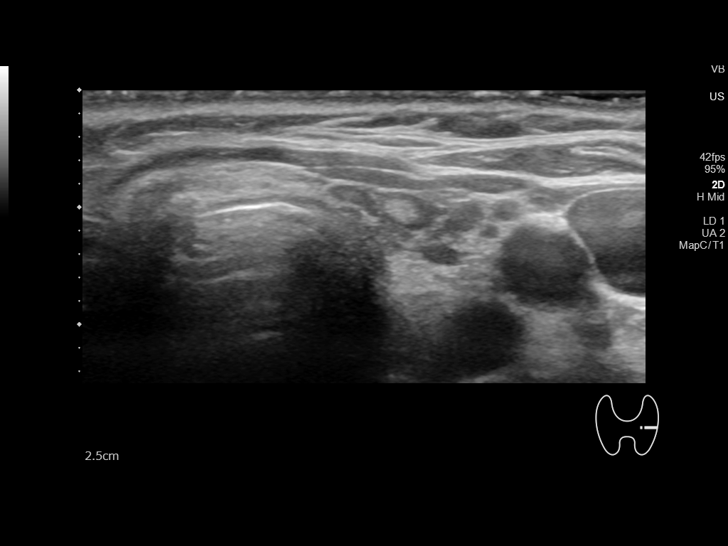
[im 24/29]
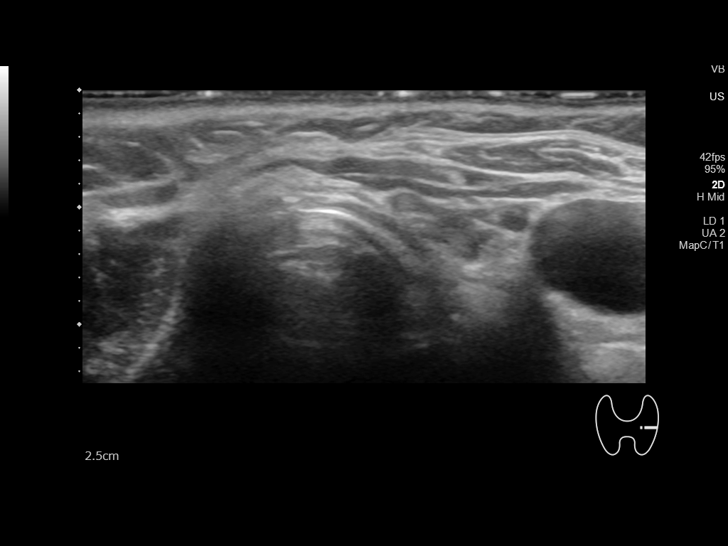
[im 26/29]
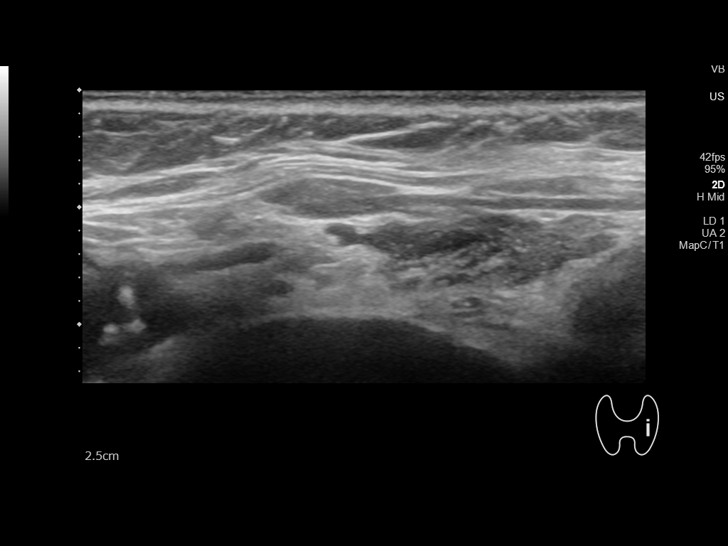
[im 29/29]
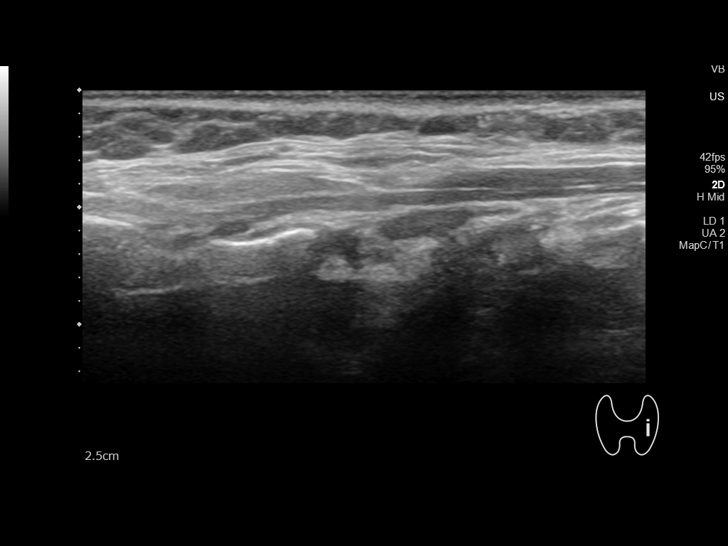

[14 of 25 positions shown; findings below may reference images not displayed]

FINDINGS: Parenchymal Echotexture: Markedly heterogenous

Isthmus: 2 mm

Right lobe: 2.4 x 1.0 x 1.1 cm

Left lobe: 2.5 x 0.6 x 0.9 cm

_________________________________________________________

Estimated total number of nodules >/= 1 cm: 0

Number of spongiform nodules >/=  2 cm not described below (TR1): 0

Number of mixed cystic and solid nodules >/= 1.5 cm not described
below (TR2): 0

_________________________________________________________

Atrophic and markedly heterogeneous thyroid with some areas of
background pseudo nodularity compatible with sequelae from chronic
medical thyroid disease. No significant focal abnormality or nodule.
No hypervascularity or regional adenopathy.
IMPRESSION: Atrophic heterogeneous thyroid compatible with sequelae from chronic
medical thyroid disease.

Negative for nodule.

The above is in keeping with the ACR TI-RADS recommendations - [HOSPITAL] 5038;[DATE].

## 2023-03-15 ENCOUNTER — Inpatient Hospital Stay: Payer: Medicare Other

## 2023-03-15 ENCOUNTER — Inpatient Hospital Stay: Payer: Medicare Other | Attending: Hematology and Oncology

## 2023-03-15 ENCOUNTER — Inpatient Hospital Stay (HOSPITAL_BASED_OUTPATIENT_CLINIC_OR_DEPARTMENT_OTHER): Payer: Medicare Other | Attending: Hematology and Oncology | Admitting: Hematology and Oncology

## 2023-03-15 VITALS — BP 141/63 | HR 66 | Temp 97.5°F | Resp 18 | Ht 63.0 in | Wt 156.3 lb

## 2023-03-15 DIAGNOSIS — C50412 Malignant neoplasm of upper-outer quadrant of left female breast: Secondary | ICD-10-CM

## 2023-03-15 DIAGNOSIS — R5383 Other fatigue: Secondary | ICD-10-CM | POA: Insufficient documentation

## 2023-03-15 DIAGNOSIS — Z171 Estrogen receptor negative status [ER-]: Secondary | ICD-10-CM | POA: Diagnosis not present

## 2023-03-15 DIAGNOSIS — Z5111 Encounter for antineoplastic chemotherapy: Secondary | ICD-10-CM | POA: Diagnosis not present

## 2023-03-15 DIAGNOSIS — Z9013 Acquired absence of bilateral breasts and nipples: Secondary | ICD-10-CM | POA: Diagnosis not present

## 2023-03-15 LAB — CBC WITH DIFFERENTIAL (CANCER CENTER ONLY)
Abs Immature Granulocytes: 0.1 10*3/uL — ABNORMAL HIGH (ref 0.00–0.07)
Basophils Absolute: 0 10*3/uL (ref 0.0–0.1)
Basophils Relative: 1 %
Eosinophils Absolute: 0.2 10*3/uL (ref 0.0–0.5)
Eosinophils Relative: 2 %
HCT: 36.5 % (ref 36.0–46.0)
Hemoglobin: 12.5 g/dL (ref 12.0–15.0)
Immature Granulocytes: 1 %
Lymphocytes Relative: 16 %
Lymphs Abs: 1.3 10*3/uL (ref 0.7–4.0)
MCH: 33.4 pg (ref 26.0–34.0)
MCHC: 34.2 g/dL (ref 30.0–36.0)
MCV: 97.6 fL (ref 80.0–100.0)
Monocytes Absolute: 0.6 10*3/uL (ref 0.1–1.0)
Monocytes Relative: 8 %
Neutro Abs: 5.5 10*3/uL (ref 1.7–7.7)
Neutrophils Relative %: 72 %
Platelet Count: 213 10*3/uL (ref 150–400)
RBC: 3.74 MIL/uL — ABNORMAL LOW (ref 3.87–5.11)
RDW: 13.1 % (ref 11.5–15.5)
WBC Count: 7.7 10*3/uL (ref 4.0–10.5)
nRBC: 0 % (ref 0.0–0.2)

## 2023-03-15 LAB — CMP (CANCER CENTER ONLY)
ALT: 16 U/L (ref 0–44)
AST: 17 U/L (ref 15–41)
Albumin: 4 g/dL (ref 3.5–5.0)
Alkaline Phosphatase: 59 U/L (ref 38–126)
Anion gap: 5 (ref 5–15)
BUN: 12 mg/dL (ref 8–23)
CO2: 26 mmol/L (ref 22–32)
Calcium: 8.8 mg/dL — ABNORMAL LOW (ref 8.9–10.3)
Chloride: 108 mmol/L (ref 98–111)
Creatinine: 0.56 mg/dL (ref 0.44–1.00)
GFR, Estimated: >60 mL/min (ref >60)
Glucose, Bld: 93 mg/dL (ref 70–99)
Potassium: 4.3 mmol/L (ref 3.5–5.1)
Sodium: 139 mmol/L (ref 135–145)
Total Bilirubin: 0.6 mg/dL (ref 0.0–1.2)
Total Protein: 6.3 g/dL — ABNORMAL LOW (ref 6.5–8.1)

## 2023-03-15 MED ORDER — SODIUM CHLORIDE 0.9% FLUSH
10.0000 mL | Freq: Once | INTRAVENOUS | Status: AC
  Administered 2023-03-15: 10 mL

## 2023-03-15 MED ORDER — PALONOSETRON HCL INJECTION 0.25 MG/5ML
0.2500 mg | Freq: Once | INTRAVENOUS | Status: AC
  Administered 2023-03-15: 0.25 mg via INTRAVENOUS
  Filled 2023-03-15: qty 5

## 2023-03-15 MED ORDER — HEPARIN SOD (PORK) LOCK FLUSH 100 UNIT/ML IV SOLN
500.0000 [IU] | Freq: Once | INTRAVENOUS | Status: AC | PRN
Start: 2023-03-15 — End: 2023-03-15
  Administered 2023-03-15: 500 [IU]

## 2023-03-15 MED ORDER — SODIUM CHLORIDE 0.9% FLUSH
10.0000 mL | INTRAVENOUS | Status: DC | PRN
  Administered 2023-03-15: 10 mL

## 2023-03-15 MED ORDER — DEXAMETHASONE SODIUM PHOSPHATE 10 MG/ML IJ SOLN
10.0000 mg | Freq: Once | INTRAMUSCULAR | Status: AC
Start: 2023-03-15 — End: 2023-03-15
  Administered 2023-03-15: 10 mg via INTRAVENOUS
  Filled 2023-03-15: qty 1

## 2023-03-15 MED ORDER — FLUOROURACIL CHEMO INJECTION 2.5 GM/50ML
600.0000 mg/m2 | Freq: Once | INTRAVENOUS | Status: AC
  Administered 2023-03-15: 1000 mg via INTRAVENOUS
  Filled 2023-03-15: qty 20

## 2023-03-15 MED ORDER — SODIUM CHLORIDE 0.9 % IV SOLN
600.0000 mg/m2 | Freq: Once | INTRAVENOUS | Status: AC
  Administered 2023-03-15: 1000 mg via INTRAVENOUS
  Filled 2023-03-15: qty 50

## 2023-03-15 MED ORDER — SODIUM CHLORIDE 0.9 % IV SOLN: Freq: Once | INTRAVENOUS | Status: AC

## 2023-03-15 MED ORDER — METHOTREXATE SODIUM CHEMO INJECTION (PF) 50 MG/2ML
40.0000 mg/m2 | Freq: Once | INTRAMUSCULAR | Status: AC
  Administered 2023-03-15: 69.5 mg via INTRAVENOUS
  Filled 2023-03-15: qty 2.78

## 2023-03-15 NOTE — Progress Notes (Signed)
 Patient Care Team: Charlanne Fredia CROME, MD as PCP - General (Internal Medicine) Wonda Sharper, MD as PCP - Cardiology (Cardiology) Donnald Charleston, MD as Consulting Physician (Gastroenterology) Charmayne Molly, MD as Consulting Physician (Ophthalmology) Jordan, Amy, MD as Consulting Physician (Dermatology) Mast, Man X, NP as Nurse Practitioner (Internal Medicine) Glean Stephane BROCKS, RN as Oncology Nurse Navigator Tyree Nanetta SAILOR, RN as Oncology Nurse Navigator Odean Potts, MD as Consulting Physician (Hematology and Oncology) Vanderbilt Ned, MD as Consulting Physician (General Surgery)  DIAGNOSIS:  Encounter Diagnosis  Name Primary?   Malignant neoplasm of upper-outer quadrant of left breast in female, estrogen receptor negative (HCC) Yes    SUMMARY OF ONCOLOGIC HISTORY: Oncology History  Malignant neoplasm of upper-outer quadrant of left breast in female, estrogen receptor negative (HCC)  09/13/2022 Initial Diagnosis   Screening mammogram detected spiculated left breast mass upper outer quadrant 5 o'clock position measuring 2.1 cm, axilla negative, biopsy revealed grade 2 invasive lobular cancer with LCIS ER 0%, PR 0%, Ki67 5%, HER2 0, negative, T2 N0 M0 stage IIb    09/21/2022 Cancer Staging   Staging form: Breast, AJCC 8th Edition - Clinical: Stage IIB (cT2, cN0, cM0, G2, ER-, PR-, HER2-) - Signed by Odean Potts, MD on 09/21/2022 Stage prefix: Initial diagnosis Histologic grading system: 3 grade system   09/27/2022 Breast MRI   Breast MRI: 2.5 cm left breast mass, ill-defined non-mass enhancement 8 cm. Indeterminate 1.2 cm right breast mass    09/30/2022 Initial Biopsy   Right breast biopsy demonstrates intraductal papilloma and no evidence of atypia or malignancy.   10/18/2022 Surgery   Left mastectomy scheduled with Dr. Vanderbilt   10/18/2022 Surgery   Left mastectomy: Grade 2 invasive lobular cancer 2.7 cm with DCIS, margins negative, suspicious for LVI, ER 0%, PR 0%, HER2  negative, Ki-67 5%, 0/7 lymph nodes Right mastectomy: Benign   11/23/2022 -  Chemotherapy   Patient is on Treatment Plan : BREAST Adjuvant CMF IV q21d       CHIEF COMPLIANT: Cycle 6 cmf  HISTORY OF PRESENT ILLNESS:   History of Present Illness   The patient, with a history of breast cancer, is here for cycle 6 CMF. She reports experiencing vision changes, balance issues, and cognitive difficulties, such as placing items in incorrect locations. The patient has not fallen due to the balance issues but expresses the need to be cautious, particularly when turning corners. She is hopeful that these side effects will resolve now that chemotherapy has ended.  The patient also expresses dissatisfaction with the surgical outcome of her mastectomy. She reports that the scar is not a simple line as she had expected, but instead has excess tissue, referred to as dog ears, which she finds uncomfortable. The patient did not want reconstruction and feels that she was not adequately informed about what to expect post-surgery.  The patient's port for chemotherapy is scheduled to be removed in the near future. She also mentions a family visit in March and expresses concern about how radiation treatment might affect her energy levels during that time.         ALLERGIES:  has no known allergies.  MEDICATIONS:  Current Outpatient Medications  Medication Sig Dispense Refill   acetaminophen  (TYLENOL ) 650 MG CR tablet Take 650 mg by mouth as needed for pain.     Calcium  Carb-Cholecalciferol  (CALCIUM  600+D) 600-800 MG-UNIT TABS Take 3 tablets by mouth daily.     clopidogrel  (PLAVIX ) 75 MG tablet TAKE 1 TABLET BY MOUTH DAILY  90 tablet 1   denosumab  (PROLIA ) 60 MG/ML SOSY injection Inject 60 mg into the skin every 6 (six) months. Administered at [facility] q70m     ferrous sulfate 325 (65 FE) MG tablet Take 325 mg by mouth daily with breakfast.     gabapentin  (NEURONTIN ) 100 MG capsule Take 100 mg by  mouth daily.     levothyroxine  (SYNTHROID ) 112 MCG tablet Take 1 tablet (112 mcg total) by mouth daily. 90 tablet 3   lidocaine -prilocaine  (EMLA ) cream Apply 1 Application topically as needed. 2.5-2.5% as needed.     loratadine  (CLARITIN ) 10 MG tablet Take 10 mg by mouth as needed for allergies.     melatonin 5 MG TABS Take 5 mg by mouth at bedtime.     nitroGLYCERIN  (NITROSTAT ) 0.4 MG SL tablet DISSOLVE 1 TAB UNDER TONGUE FOR CHEST PAIN - IF PAIN REMAINS AFTER 5 MIN, CALL 911 AND REPEAT DOSE. MAX 3 TABS IN 15 MINUTES 25 tablet 6   ondansetron  (ZOFRAN ) 8 MG tablet Take 1 tablet (8 mg total) by mouth every 8 (eight) hours as needed for nausea or vomiting. Start on the third day after chemotherapy. 30 tablet 1   oxyCODONE  (OXY IR/ROXICODONE ) 5 MG immediate release tablet Take 1 tablet (5 mg total) by mouth every 6 (six) hours as needed for severe pain. 15 tablet 0   prochlorperazine  (COMPAZINE ) 10 MG tablet Take 1 tablet (10 mg total) by mouth every 6 (six) hours as needed for nausea or vomiting. 30 tablet 1   rosuvastatin  (CRESTOR ) 20 MG tablet TAKE 1 TABLET BY MOUTH DAILY 90 tablet 3   Study - [name] - olpasiran (AMG 890) 142 mg/mL or placebo SQ injection (PI-Hilty) Inject 142 mg into the skin once. For investigational use only - Inject 1 mL (1 prefilled syringe) subcutaneously into appropriate injection site per protocol. (Approved injection site(s): upper arm, upper thigh, abdomen). Please contact [facility] if any questions.     VITAMIN D  PO Take 1 capsule by mouth daily.     Current Facility-Administered Medications  Medication Dose Route Frequency Provider Last Rate Last Admin   [START ON 05/11/2023] denosumab  (PROLIA ) injection 60 mg  60 mg Subcutaneous Once Gupta, Anjali L, MD       Facility-Administered Medications Ordered in Other Visits  Medication Dose Route Frequency Provider Last Rate Last Admin   cyclophosphamide  (CYTOXAN ) 1,000 mg in sodium chloride  0.9 % 250 mL  chemo infusion  600 mg/m2 (Treatment Plan Recorded) Intravenous Once Iokepa Geffre, MD 600 mL/hr at 03/15/23 1230 1,000 mg at 03/15/23 1230   fluorouracil  (ADRUCIL ) chemo injection 1,000 mg  600 mg/m2 (Treatment Plan Recorded) Intravenous Once Oziah Vitanza, MD       heparin  lock flush 100 unit/mL  500 Units Intracatheter Once PRN Marigrace Mccole, MD       methotrexate  (PF) chemo injection 69.5 mg  40 mg/m2 (Treatment Plan Recorded) Intravenous Once Ronnel Zuercher, MD       sodium chloride  flush (NS) 0.9 % injection 10 mL  10 mL Intracatheter PRN Odean Potts, MD        PHYSICAL EXAMINATION: ECOG PERFORMANCE STATUS: 1 - Symptomatic but completely ambulatory  Vitals:   03/15/23 1059  BP: (!) 141/63  Pulse: 66  Resp: 18  Temp: (!) 97.5 F (36.4 C)  SpO2: 96%   Filed Weights   03/15/23 1059  Weight: 156 lb 4.8 oz (70.9 kg)      LABORATORY DATA:  I have reviewed the  data as listed    Latest Ref Rng & Units 03/15/2023   10:25 AM 02/15/2023   10:57 AM 01/25/2023   10:20 AM  CMP  Glucose 70 - 99 mg/dL 93  888  82   BUN 8 - 23 mg/dL 12  15  14    Creatinine 0.44 - 1.00 mg/dL 9.43  9.39  9.38   Sodium 135 - 145 mmol/L 139  140  139   Potassium 3.5 - 5.1 mmol/L 4.3  4.1  4.2   Chloride 98 - 111 mmol/L 108  108  107   CO2 22 - 32 mmol/L 26  27  26    Calcium  8.9 - 10.3 mg/dL 8.8  9.2  9.0   Total Protein 6.5 - 8.1 g/dL 6.3  6.4  6.6   Total Bilirubin 0.0 - 1.2 mg/dL 0.6  0.5  0.5   Alkaline Phos 38 - 126 U/L 59  58  57   AST 15 - 41 U/L 17  16  18    ALT 0 - 44 U/L 16  15  13      Lab Results  Component Value Date   WBC 7.7 03/15/2023   HGB 12.5 03/15/2023   HCT 36.5 03/15/2023   MCV 97.6 03/15/2023   PLT 213 03/15/2023   NEUTROABS 5.5 03/15/2023    ASSESSMENT & PLAN:  Malignant neoplasm of upper-outer quadrant of left breast in female, estrogen receptor negative (HCC) 09/13/2022: Screening mammogram detected spiculated left breast mass upper outer quadrant 5 o'clock position  measuring 2.1 cm, axilla negative, 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  Indeterminate 1.2 cm right breast mass   Recommendation: 10/18/2022:Left mastectomy: Grade 2 invasive lobular cancer 2.7 cm with DCIS, margins negative, suspicious for LVI, ER 0%, PR 0%, HER2 negative, Ki-67 5%, 0/7 lymph nodes Right mastectomy: Benign adjuvant CMF x 6 cycles Adjuvant radiation therapy _______________________________________________________________________________________________________________________________________________________________   Current treatment: CMF cycle 6   She is tolerating treatment well.   Treatment related fatigue: managing with energy conservation Chemo brain   Return to clinic after radiation is completed  ------------------------------------- Assessment and Plan    Breast Cancer Completed six rounds of chemotherapy with temporary side effects including vision changes, balance issues, and cognitive changes (chemo fog). Blood work post-treatment is normal. Patient expressed dissatisfaction with surgical outcome and will discuss with surgeon in February. -Plan for radiation therapy, discuss possibility of hypofractionation for shorter course. -Set up blood tests every six months with [facility] for early cancer detection post-radiation.  Port Removal Scheduled for March 4th. -Continue to monitor and manage port until removal.  General Health Maintenance -Colorectal screening not necessary due to patient's age. -Annual follow-up appointments to be scheduled post-radiation.          No orders of the defined types were placed in this encounter.  The patient has a good understanding of the overall plan. she agrees with it. she will call with any problems that may develop before the next visit here. Total  time spent: 30 mins including face to face time and time spent for planning, charting and co-ordination of care   Clair MARLA Chad, MD 03/15/23

## 2023-03-15 NOTE — Patient Instructions (Signed)
 [facility] - A DEPT OF [facility]  Discharge Instructions: Thank you for choosing [facility] to provide your oncology and hematology care.   If you have a lab appointment with the Cancer Center, please go directly to the Cancer Center and check in at the registration area.   Wear comfortable clothing and clothing appropriate for easy access to any Portacath or PICC line.   We strive to give you quality time with your provider. You may need to reschedule your appointment if you arrive late (15 or more minutes).  Arriving late affects you and other patients whose appointments are after yours.  Also, if you miss three or more appointments without notifying the office, you may be dismissed from the clinic at the provider's discretion.      For prescription refill requests, have your pharmacy contact our office and allow 72 hours for refills to be completed.    Today you received the following chemotherapy and/or immunotherapy agents Cytoxan, Methotrexate, Fluorouracil    To help prevent nausea and vomiting after your treatment, we encourage you to take your nausea medication as directed.  BELOW ARE SYMPTOMS THAT SHOULD BE REPORTED IMMEDIATELY: *FEVER GREATER THAN 100.4 F (38 C) OR HIGHER *CHILLS OR SWEATING *NAUSEA AND VOMITING THAT IS NOT CONTROLLED WITH YOUR NAUSEA MEDICATION *UNUSUAL SHORTNESS OF BREATH *UNUSUAL BRUISING OR BLEEDING *URINARY PROBLEMS (pain or burning when urinating, or frequent urination) *BOWEL PROBLEMS (unusual diarrhea, constipation, pain near the anus) TENDERNESS IN MOUTH AND THROAT WITH OR WITHOUT PRESENCE OF ULCERS (sore throat, sores in mouth, or a toothache) UNUSUAL RASH, SWELLING OR PAIN  UNUSUAL VAGINAL DISCHARGE OR ITCHING   Items with * indicate a potential emergency and should be followed up as soon as possible or go to the Emergency Department if any problems should occur.  Please show the CHEMOTHERAPY ALERT  CARD or IMMUNOTHERAPY ALERT CARD at check-in to the Emergency Department and triage nurse.  Should you have questions after your visit or need to cancel or reschedule your appointment, please contact [facility] - A DE[facility]  Dept: [phone]  and follow the prompts.  Office hours are 8:00 a.m. to 4:30 p.m. Monday - Friday. Please note that voicemails left after 4:00 p.m. may not be returned until the following business day.  We are closed weekends and major holidays. You have access to a nurse at all times for urgent questions. Please call the main number to the clinic Dept: [phone] and follow the prompts.   For any non-urgent questions, you may also contact your provider using MyChart. We now offer e-Visits for anyone 68 and older to request care online for non-urgent symptoms. For details visit [email or web address].   Also download the MyChart app! Go to the app store, search "MyChart", open the app, select [place], and log in with your MyChart username and password.

## 2023-03-15 NOTE — Assessment & Plan Note (Signed)
 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  Indeterminate 1.2 cm right breast mass   Recommendation: 10/18/2022:Left mastectomy: Grade 2 invasive lobular cancer 2.7 cm with DCIS, margins negative, suspicious for LVI, ER 0%, PR 0%, HER2 negative, Ki-67 5%, 0/7 lymph nodes Right mastectomy: Benign adjuvant CMF x 6 cycles Adjuvant radiation therapy _______________________________________________________________________________________________________________________________________________________________   Current treatment: CMF cycle 5   She is tolerating treatment well.   Treatment related fatigue: managing with energy conservation Chemo brain   Return to clinic after radiation is completed

## 2023-04-03 ENCOUNTER — Encounter: Payer: Medicare Other | Admitting: *Deleted

## 2023-04-03 DIAGNOSIS — Z006 Encounter for examination for normal comparison and control in clinical research program: Secondary | ICD-10-CM

## 2023-04-03 MED ORDER — STUDY - OCEAN(A) - OLPASIRAN (AMG 890) 142 MG/ML OR PLACEBO SQ INJECTION (PI-HILTY)
142.0000 mg | PREFILLED_SYRINGE | Freq: Once | SUBCUTANEOUS | Status: AC
  Administered 2023-04-03: 142 mg via SUBCUTANEOUS
  Filled 2023-04-03: qty 1

## 2023-04-03 NOTE — Research (Signed)
[name]  Week 96  Vitals: [x]  Experience any AE/SAE/Hospitalizations []  Yes [x]  No  If yes please explain:  EKG:[x]  MD to see: TS Labs collected:  [phone]  Discussed with patient about the importance of not letting any one draw cholesterol or lipids. As to we are blinded to results. Reassured patient if we needed to be contacted the study team would reach out to our unblinded person.   No labs at next visit 108.    Non-Fatal Potential Endpoint Assessment Yes  No   Has the subject experienced/undergone any of the following since the last visit/contact?   []   [x]    Any Coronary Artery Revascularization/Cerebrovascular Revascularization/ Peripheral Artery Revascularization/Amputation Procedure   []   [x]    Myocardial Infarction []   [x]    Stroke   []   [x]    Provide the date for the non-fatal Potential Endpoints status:   []   [x]     IP admin please see MAR (please add Box # to comment section on MAR) [x]   Current Outpatient Medications:    acetaminophen (TYLENOL) 650 MG CR tablet, Take 650 mg by mouth as needed for pain., Disp: , Rfl:    Calcium Carb-Cholecalciferol (CALCIUM 600+D) 600-800 MG-UNIT TABS, Take 3 tablets by mouth daily., Disp: , Rfl:    clopidogrel (PLAVIX) 75 MG tablet, TAKE 1 TABLET BY MOUTH DAILY, Disp: 90 tablet, Rfl: 1   denosumab (PROLIA) 60 MG/ML SOSY injection, Inject 60 mg into the skin every 6 (six) months. Administered at [facility] q21m, Disp: , Rfl:    ferrous sulfate 325 (65 FE) MG tablet, Take 325 mg by mouth daily with breakfast., Disp: , Rfl:    gabapentin (NEURONTIN) 100 MG capsule, Take 100 mg by mouth daily., Disp: , Rfl:    levothyroxine (SYNTHROID) 112 MCG tablet, Take 1 tablet (112 mcg total) by mouth daily., Disp: 90 tablet, Rfl: 3   loratadine (CLARITIN) 10 MG tablet, Take 10 mg by mouth as needed for allergies., Disp: , Rfl:    melatonin 5 MG TABS, Take 5 mg by mouth at bedtime., Disp: , Rfl:    nitroGLYCERIN (NITROSTAT) 0.4 MG SL tablet,  DISSOLVE 1 TAB UNDER TONGUE FOR CHEST PAIN - IF PAIN REMAINS AFTER 5 MIN, CALL 911 AND REPEAT DOSE. MAX 3 TABS IN 15 MINUTES, Disp: 25 tablet, Rfl: 6   ondansetron (ZOFRAN) 8 MG tablet, Take 1 tablet (8 mg total) by mouth every 8 (eight) hours as needed for nausea or vomiting. Start on the third day after chemotherapy., Disp: 30 tablet, Rfl: 1   oxyCODONE (OXY IR/ROXICODONE) 5 MG immediate release tablet, Take 1 tablet (5 mg total) by mouth every 6 (six) hours as needed for severe pain., Disp: 15 tablet, Rfl: 0   prochlorperazine (COMPAZINE) 10 MG tablet, Take 1 tablet (10 mg total) by mouth every 6 (six) hours as needed for nausea or vomiting., Disp: 30 tablet, Rfl: 1   rosuvastatin (CRESTOR) 20 MG tablet, TAKE 1 TABLET BY MOUTH DAILY, Disp: 90 tablet, Rfl: 3   Study - [name] - olpasiran (AMG 890) 142 mg/mL or placebo SQ injection (PI-Hilty), Inject 142 mg into the skin once. For investigational use only - Inject 1 mL (1 prefilled syringe) subcutaneously into appropriate injection site per protocol. (Approved injection site(s): upper arm, upper thigh, abdomen). Please contact [facility] if any questions., Disp: , Rfl:    VITAMIN D PO, Take 1 capsule by mouth daily., Disp: , Rfl:    lidocaine-prilocaine (EMLA) cream, Apply 1  Application topically as needed. 2.5-2.5% as needed., Disp: , Rfl:   Current Facility-Administered Medications:    [START ON 05/11/2023] denosumab (PROLIA) injection 60 mg, 60 mg, Subcutaneous, Once, Mahlon Gammon, MD

## 2023-04-03 NOTE — Progress Notes (Signed)
Patient seen today in routine follow up for Jessica [name].  Overall doing well and no complaints.  She has had recent breast cancer, and had double mastectomy and chemotherapy.  Now she is scheduled for radiation therapy.  Currently she feels great, and we had [name] long discussion today and I answered several questions regarding the trial, future of prevention, and her personal outlook.     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 59. Data is abnormal. Taken on 04/03/23 9:07 AM  Temp 97 F (36.1 C)Temp. 97 F (36.1 C). Data is abnormal. Taken on 04/03/23 9:07 AM  Weight 151 lb 9.6 oz (68.8 kg)  Resp 16  SpO2 100 %   Alert, oriented, cognitive, and engaged No JVD No bruits Lungs clear Cor regular without significant murmur Abd  soft Ext  no edema  ECG  SB.  Otherwise normal.  No significant change (despite different reading which may be lead placement) from tracing of 08/03/2022.    Impression   Prior MI  Recent breast cancer  Significant elevation of Lp([name]) currently enrolled in Jessica [name] Dyslipidemia under treatment   Plan   Continue in clinical trial.   Arturo Morton. Jahkai Yandell MD [facility] Program Chair, [facility]. Otherwise normal.

## 2023-04-04 ENCOUNTER — Other Ambulatory Visit: Payer: Self-pay

## 2023-04-05 NOTE — Progress Notes (Signed)
Location of Breast Cancer:  Histology per Pathology Report: 10/18/2022    Receptor Status:  Dr. 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  Indeterminate 1.2 cm right breast mass    Past/Anticipated interventions by surgeon, if any: Bilateral simple masectomy  Past/Anticipated interventions by medical oncology, if any:  Dr. Pamelia Hoit on 03/15/2023.    Lymphedema issues, if any:  None  Pain issues, if any:  None  SAFETY ISSUES: Prior radiation? None Pacemaker/ICD? None Possible current pregnancy?N/A Is the patient on methotrexate? None  Current Complaints / other details:  None

## 2023-04-06 NOTE — Progress Notes (Signed)
Radiation Oncology         [phone]) [phone] ________________________________  Initial Outpatient Consultation  Name: Jessica [name] MRN: [id number]  Date: 04/07/2023  DOB: June 17, 1940  FA:[id number], Jessica Breech, MD  Jessica Croissant, MD   REFERRING PHYSICIAN: Serena Croissant, MD  DIAGNOSIS:    ICD-10-CM   1. Malignant neoplasm of upper-outer quadrant of left breast in female, estrogen receptor negative (HCC)  C50.412    Z17.1        Cancer Staging  Malignant neoplasm of upper-outer quadrant of left breast in female, estrogen receptor negative (HCC) Staging form: Breast, AJCC 8th Edition - Clinical: Stage IIB (cT2, cN0, cM0, G2, ER-, PR-, HER2-) - Signed by Jessica Croissant, MD on 09/21/2022 Stage prefix: Initial diagnosis Histologic grading system: 3 grade system   Stage IIB (cT2, cN0, cM0) Left Breast UOQ. Invasive and in situ lobular carcinoma, ER- / PR- / Her2-, Grade 2: s/p bilateral mastectomies, left axillary SLN evaluation, followed by 6 cycles of adjuvant chemotherapy with CMF  CHIEF COMPLAINT: Here to discuss management of left breast cancer  HISTORY OF PRESENT ILLNESS::Jessica [name] is a 83 y.o. female who presented with several left breast abnormalities on the following imaging: bilateral screening mammogram on the date of 09/06/22. No symptoms, if any, were reported at that time. Left breast diagnostic mammogram and left breast ultrasound on 09/12/22 further demonstrated: a suspicious 2.1 cm mass in the 3 o'clock left breast, located 5 cmfn. No evidence of left axillary lymphadenopathy was demonstrated.     Biopsy of the 3 o'clock left breast on 09/13/22 showed grade 2 invasive lobular carcinoma measuring 9 mm in the greatest linear extent of the sample with LCIS. ER status 0% negative; PR status 0% negative; Proliferation marker Ki67 at 5%; Her2 status negative.   Bilateral breast MRI on 09/24/22 redemonstrated the 2.5 cm biopsy proven ILC in the posterior, slightly outer left  breast, along with a not previously demonstrated ill-defined and nodular non mass-like enhancement extending, anteriorly, laterally, and inferior to the site of biopsy proven malignancy, collectively spanning an area measuring approximately 8 cm in the greatest extent. An indeterminate area of linear enhancement was also demonstrated in the slightly outer central right breast. No abnormal appearing lymph nodes were demonstrated in either axilla.     A biopsy of the outer central right breast linear enhancement was accordingly collected on 09/30/22. Pathology showed no evidence of malignancy, and findings consistent with proliferative fibrocystic changes with a small intraductal papilloma measuring approximately 3 mm.   She was accordingly referred to Dr. Luisa [name] and opted to proceed with bilateral mastectomies with left axillary node excisions (no SLN evaluated per the patient's preference) on 10/18/22. Pathology from the procedure revealed:  -- Left mastectomy: tumor the size of 2.7 cm in the greatest extent; histology of grade 2 invasive lobular carcinoma with LCIS; suspicion for LVI noted (noted as not pathognomic given extensive retraction artifact) all margins negative for invasive and in situ carcinoma; margin status to invasive of disease of 10 mm from the deep margin; margin status to in situ disease not indicated; nodal status of 3/3 left axillary lymph node excisions negative for malignancy. ER status negative; PR status negative; Her2 status negative; Proliferation marker Ki67 at 5%.  -- Right mastectomy: no evidence of malignancy, with benign findings including residual intraductal papilloma with UDH.   She was initially seen on consultation with Dr. Pamelia [name] prior to her breast surgery, and initially expressed hesitancy regarding chemotherapy. However, based on her  final pathology confirming triple negative disease, and upon further discussion with Dr. Pamelia [name], she agreed to proceed with adjuvant  chemotherapy consisting of CMF (Cytoxan, Methotrexate, and Fluorouracil) on 11/23/22. Chemo toxicities encountered by the patient included: fatigue, hair loss, and "chemo-brain". She otherwise tolerated chemotherapy quite well overall, and recently completed her 6th and final cycle of CMF on 03/15/23.   Based on her ER negative status, there is no role for adjuvant anti-estrogen therapy.  PATHOLOGY REPORT 10/18/22: Procedure: Mastectomy Specimen Laterality: Left Histologic Type: Invasive lobular carcinoma (loss of E-cadherin performed on the biopsy) Histologic Grade:      Glandular (Acinar)/Tubular Differentiation: 3/3      Nuclear Pleomorphism: 2/3      Mitotic Rate: 1/3      Overall Grade: II/III Tumor Size: 27 x 19 x 17 mm Ductal Carcinoma In Situ: Not identified; however, there is background lobular carcinoma in situ (LCIS) Lymphatic and/or Vascular Invasion: There are areas suspicious for lymphovascular invasion; however, this is secondary to extensive retraction type artifact. Treatment Effect in the Breast: No known presurgical therapy Margins: All margins negative for invasive carcinoma      Distance from Closest Margin (mm): 10      Specify Closest Margin (required only if <74mm): Deep DCIS Margins: N/A      Distance from Closest Margin (mm): N/A      Specify Closest Margin (required only if <32mm): N/A Regional Lymph Nodes:      Number of Lymph Nodes Examined: 7      Number of Sentinel Nodes Examined: 0      Number of Lymph Nodes with Macrometastases (>2 mm): 0      Number of Lymph Nodes with Micrometastases: 0      Number of Lymph Nodes with Isolated Tumor Cells (=0.2 mm or =200 cells): 0      Size of Largest Metastatic Deposit (mm): N/A      Extranodal Extension: N/A Distant Metastasis:      Distant Site(s) Involved: N/A Breast Biomarker Testing Performed on Previous Biopsy:      Testing Performed on Case Number: [id number]            Estrogen Receptor: Negative,  0%            Progesterone Receptor: Negative, 0%            HER2: Negative, 0 out of 3            Ki-67: 5%   PREVIOUS RADIATION THERAPY: No  PAST MEDICAL HISTORY:  has a past medical history of Anemia (12/2015), Bronchitis, chronic (HCC), Cataract, Chronic kidney disease, Closed fracture of right distal humerus, Coronary artery disease, Hand, foot and mouth disease (HFMD), Hearing loss (05/03/2016), High cholesterol, Hypothyroidism, Myocardial infarction (HCC), Osteopenia, Pericarditis (01/1999), Pneumothorax (01/1999), Thyroid disorder, and Vitamin B 12 deficiency.    PAST SURGICAL HISTORY: Past Surgical History:  Procedure Laterality Date   birthmark      removed,left leg   BREAST BIOPSY Left 09/13/2022   [place] LT BREAST BX W LOC DEV 1ST LESION IMG BX SPEC US GUIDE 09/13/2022 [facility]   CARDIAC CATHETERIZATION     CATARACT EXTRACTION  2017   CORONARY STENT INTERVENTION N/A 09/01/2020   Procedure: CORONARY STENT INTERVENTION;  Surgeon: Lyn Records, MD;  Location: [facility];  Service: Cardiovascular;  Laterality: N/A;   LEFT HEART CATH AND CORONARY ANGIOGRAPHY N/A 09/01/2020   Procedure: LEFT HEART CATH AND CORONARY ANGIOGRAPHY;  Surgeon: Verdis Prime  W, MD;  Location: [facility];  Service: Cardiovascular;  Laterality: N/A;   ORIF HUMERUS FRACTURE Right 04/10/2019   Procedure: RIGHT OPEN REDUCTION INTERNAL FIXATION (ORIF) DISTAL HUMERUS FRACTURE WITH EXTENSION;  Surgeon: Bjorn Pippin, MD;  Location: [facility];  Service: Orthopedics;  Laterality: Right;   PORTACATH PLACEMENT N/A 11/15/2022   Procedure: PORT PLACEMENT WITH ULTRASOUND GUIDANCE;  Surgeon: Harriette Bouillon, MD;  Location: [facility];  Service: General;  Laterality: N/A;   SIMPLE MASTECTOMY WITH AXILLARY SENTINEL NODE BIOPSY Bilateral 10/18/2022   Procedure: BILATERAL SIMPLE MASTECTOMY;  Surgeon: Harriette Bouillon, MD;  Location: [facility];  Service: General;  Laterality:  Bilateral;  PEC BLOCK   TONSILLECTOMY AND ADENOIDECTOMY  1952?    FAMILY HISTORY: family history includes Arthritis in her paternal grandmother; Asthma in her mother; Ataxia in her father; Cancer - Cervical in her sister; Diabetes in her maternal grandmother, mother, and another family member; Stroke in her father; Thyroid disease in her sister.  SOCIAL HISTORY:  reports that she has never smoked. She has never used smokeless tobacco. She reports current alcohol use of about 3.0 standard drinks of alcohol per week. She reports that she does not use drugs.  ALLERGIES: Patient has no known allergies.  MEDICATIONS:  Current Outpatient Medications  Medication Sig Dispense Refill   acetaminophen (TYLENOL) 650 MG CR tablet Take 650 mg by mouth as needed for pain.     Calcium Carb-Cholecalciferol (CALCIUM 600+D) 600-800 MG-UNIT TABS Take 3 tablets by mouth daily.     clopidogrel (PLAVIX) 75 MG tablet TAKE 1 TABLET BY MOUTH DAILY 90 tablet 1   denosumab (PROLIA) 60 MG/ML SOSY injection Inject 60 mg into the skin every 6 (six) months. Administered at [facility] q48m     ferrous sulfate 325 (65 FE) MG tablet Take 325 mg by mouth daily with breakfast.     gabapentin (NEURONTIN) 100 MG capsule Take 100 mg by mouth daily.     levothyroxine (SYNTHROID) 112 MCG tablet Take 1 tablet (112 mcg total) by mouth daily. 90 tablet 3   loratadine (CLARITIN) 10 MG tablet Take 10 mg by mouth as needed for allergies.     melatonin 5 MG TABS Take 5 mg by mouth at bedtime.     nitroGLYCERIN (NITROSTAT) 0.4 MG SL tablet DISSOLVE 1 TAB UNDER TONGUE FOR CHEST PAIN - IF PAIN REMAINS AFTER 5 MIN, CALL 911 AND REPEAT DOSE. MAX 3 TABS IN 15 MINUTES 25 tablet 6   ondansetron (ZOFRAN) 8 MG tablet Take 1 tablet (8 mg total) by mouth every 8 (eight) hours as needed for nausea or vomiting. Start on the third day after chemotherapy. 30 tablet 1   oxyCODONE (OXY IR/ROXICODONE) 5 MG immediate release tablet Take 1 tablet (5 mg  total) by mouth every 6 (six) hours as needed for severe pain. 15 tablet 0   prochlorperazine (COMPAZINE) 10 MG tablet Take 1 tablet (10 mg total) by mouth every 6 (six) hours as needed for nausea or vomiting. 30 tablet 1   rosuvastatin (CRESTOR) 20 MG tablet TAKE 1 TABLET BY MOUTH DAILY 90 tablet 3   Study - [name] - olpasiran (AMG 890) 142 mg/mL or placebo SQ injection (PI-Hilty) Inject 142 mg into the skin once. For investigational use only - Inject 1 mL (1 prefilled syringe) subcutaneously into appropriate injection site per protocol. (Approved injection site(s): upper arm, upper thigh, abdomen). Please contact [facility] if any questions.  VITAMIN D PO Take 1 capsule by mouth daily.     lidocaine-prilocaine (EMLA) cream Apply 1 Application topically as needed. 2.5-2.5% as needed.     Current Facility-Administered Medications  Medication Dose Route Frequency Provider Last Rate Last Admin   [START ON 05/11/2023] denosumab (PROLIA) injection 60 mg  60 mg Subcutaneous Once Mahlon Gammon, MD        REVIEW OF SYSTEMS: As above in HPI.   PHYSICAL EXAM:  height is 5\' 3"  (1.6 m) and weight is 158 lb 2 oz (71.7 kg). Her temporal temperature is 96.4 F (35.8 C) (abnormal). Her blood pressure is 134/78 and her pulse is 58 (abnormal). Her respiration is 18 and oxygen saturation is 99%.   General: Alert and oriented, in no acute distress HEENT: Head is normocephalic. Extraocular movements are intact.  Heart: Regular in rate and rhythm with no murmurs, rubs, or gallops. Chest: Clear to auscultation bilaterally, with no rhonchi, wheezes, or rales. Abdomen: Soft, nontender, nondistended, with no rigidity or guarding. Extremities: No cyanosis or edema. Lymphatics: no axillary masses Skin: No concerning lesions. Musculoskeletal: symmetric strength and muscle tone throughout. Neurologic: Cranial nerves II through XII are grossly intact. No obvious focalities. Speech is fluent.  Coordination is intact. Psychiatric: Judgment and insight are intact. Affect is appropriate. Breasts: no palpable masses appreciated in the chest wall or axillae b/l . No sign of recurrence   ECOG = 1  0 - Asymptomatic (Fully active, able to carry on all predisease activities without restriction)  1 - Symptomatic but completely ambulatory (Restricted in physically strenuous activity but ambulatory and able to carry out work of a light or sedentary nature. For example, light housework, office work)  2 - Symptomatic, <50% in bed during the day (Ambulatory and capable of all self care but unable to carry out any work activities. Up and about more than 50% of waking hours)  3 - Symptomatic, >50% in bed, but not bedbound (Capable of only limited self-care, confined to bed or chair 50% or more of waking hours)  4 - Bedbound (Completely disabled. Cannot carry on any self-care. Totally confined to bed or chair)  5 - Death   Santiago Glad MM, Creech RH, Tormey DC, et al. [phone]). "Toxicity and response criteria of the [facility]". Am. Evlyn Clines. Oncol. 5 (6): 649-55   LABORATORY DATA:  Lab Results  Component Value Date   WBC 7.7 03/15/2023   HGB 12.5 03/15/2023   HCT 36.5 03/15/2023   MCV 97.6 03/15/2023   PLT 213 03/15/2023   CMP     Component Value Date/Time   NA 139 03/15/2023 1025   NA 146 (H) 05/06/2021 1548   NA 140 04/27/2017 0000   K 4.3 03/15/2023 1025   K 4.3 04/27/2017 0000   CL 108 03/15/2023 1025   CO2 26 03/15/2023 1025   GLUCOSE 93 03/15/2023 1025   BUN 12 03/15/2023 1025   BUN 16 05/06/2021 1548   CREATININE 0.56 03/15/2023 1025   CREATININE 0.58 (L) 05/30/2022 0811   CALCIUM 8.8 (L) 03/15/2023 1025   CALCIUM 9.0 04/27/2017 0000   PROT 6.3 (L) 03/15/2023 1025   PROT 6.1 03/16/2021 0920   ALBUMIN 4.0 03/15/2023 1025   ALBUMIN 4.2 03/16/2021 0920   ALBUMIN 3.9 04/27/2017 0000   AST 17 03/15/2023 1025   ALT 16 03/15/2023 1025   ALT 16 04/27/2017  0000   ALKPHOS 59 03/15/2023 1025   ALKPHOS 64 04/27/2017 0000   BILITOT 0.6  03/15/2023 1025   GFRNONAA >60 03/15/2023 1025   GFRNONAA 89 07/23/2020 0815   GFRAA 103 07/23/2020 0815         RADIOGRAPHY:  as above  IMPRESSION/PLAN:  Cancer Staging  Malignant neoplasm of upper-outer quadrant of left breast in female, estrogen receptor negative (HCC) Staging form: Breast, AJCC 8th Edition - Clinical: Stage IIB (cT2, cN0, cM0, G2, ER-, PR-, HER2-) - Signed by Jessica Croissant, MD on 09/21/2022 Stage prefix: Initial diagnosis Histologic grading system: 3 grade system    It was a pleasure meeting the patient today. We discussed the risks, benefits, and side effects of radiotherapy. She understands that radiation therapy is optional and may carry a small benefit to reduce risk of left chest wall recurrence (probably no more than 10% absolute risk reduction).  It is unlikely to improve her life expectancy.  After a lengthy discussion she would like to be cautious and pursue local treatment. Given her age, I recommend standard hypofractionated radiotherapy to the left chest wall to reduce her risk of locoregional recurrence while minimizing acute toxicity.  We discussed that radiation would take approximately 3 weeks to complete  I reviewed the logistics, benefits, risks, and potential side effects of this treatment in detail. Risks may include but not necessary be limited to acute and late injury tissue in the radiation fields such as skin irritation (change in color/pigmentation, itching, dryness, pain, peeling). She may experience fatigue. We also discussed possible risk of long term cosmetic changes or scar tissue. There is also a smaller risk for lung toxicity, cardiac toxicity, brachial plexopathy, lymphedema, musculoskeletal changes, rib fragility or induction of a second malignancy, late chronic non-healing soft tissue wound.    The patient asked good questions which I answered to her  satisfaction. She is enthusiastic about proceeding with treatment. A consent form has been signed and placed in her chart. We will arrange CT simulation/treatment planning soon  On date of service, in total, I spent 60 minutes on this encounter. Patient was seen in person.   __________________________________________   Lonie Peak, MD  This document serves as a record of services personally performed by Lonie Peak, MD. It was created on her behalf by Neena Rhymes, a trained medical scribe. The creation of this record is based on the scribe's personal observations and the provider's statements to them. This document has been checked and approved by the attending provider.

## 2023-04-06 NOTE — Research (Addendum)
Are there any labs that are clinically significant?  Yes []  OR No[x]   Please FORWARD back to me with any changes or follow up!   Chrystie Nose, MD, [facility], FACP  [place]  [facility]  Medical Director of the Advanced Lipid Disorders &  Cardiovascular Risk Reduction Clinic Diplomate of the American Board of Clinical Lipidology Attending Cardiologist  Direct Dial: [phone]  Fax: [phone]  Website:  [email or web address]  c/o Claretta Fraise                              SUBJECT ID: [id number]                     [facility]                 SUBJECT INITIALS NOT COLLECTED:                     [address] [place] 3T517                 VISIT: [phone]                     Ginette Otto [place] [id number]                   SPONSOR REPORT TO:                 COLLECTION TIME:09:45 DATE:03-Apr-2023                     Mertha Baars                      DATE RECEIVED IN LABORATORY: 05-Apr-2023                     c/o Sponsor(or Addtl) [facility].Study DATE REPORTED BY LABORATORY: 05-Apr-2023                     [facility]                          SEX: F  BIRTHDATE:  04-Sep-1940    AGE: 82y                     [address]                  Wallis Bamberg NO.: [address] [place] [address]                                                                                        Ref. Ranges               Clinical    Comments  Significance                                                                            Yes*  No                    SM AMG890 ANTIBODY COLL D/T                      CDate PreD     03-Apr-2023                                               CTime PreD     09:45                     FASTING GLUCOSE                      Glucose        99           70-100 mg/dL                               IS SUBJECT FASTING?                      Fasting?       Yes   ANION GAP                      Anion Gap      17           7-18 mEq/L  EGFR                      CKDEPI eGF     91           mL/min/1.6m2                                                            No Ref Rng   SM AMG890 LPA COLLECTION D/T                      Coll Date      03-Apr-2023                                               Coll Time      09:45   ALT > 3 X ULN                      ALT>3XULN      Criteria not met  ALT > 5 X ULN                      ALT>5XULN      Criteria not met                                        ALT > 8 X ULN                      ALT>8XULN      Criteria not met                                        ALT & TBIL                      ALT & TBIL     Criteria not met                                        AST > 3 X ULN                      AST>3XULN      Criteria not met                                        AST > 5 X ULN                      AST>5XULN      Criteria not met                                        AST > 8 X ULN                      AST>8XULN      Criteria not met                                        AST & TBIL                      AST & TBIL     Criteria not met  HBA1C                      Hgb A1c        5.8          <6.5%    COAGULATION GROUP                      APTT           To follow  PT             To follow                                                INR            To follow                                              ALT & INR                      ALT & INR      To follow                                              AST & INR                      AST & INR      To follow

## 2023-04-07 ENCOUNTER — Encounter: Payer: Self-pay | Admitting: Radiation Oncology

## 2023-04-07 ENCOUNTER — Ambulatory Visit
Admission: RE | Admit: 2023-04-07 | Discharge: 2023-04-07 | Disposition: A | Discharge status: Home / Self Care | Payer: Medicare Other | Source: Ambulatory Visit | Attending: Radiation Oncology | Admitting: Radiation Oncology

## 2023-04-07 VITALS — BP 134/78 | HR 58 | Temp 96.4°F | Resp 18 | Ht 63.0 in | Wt 158.1 lb

## 2023-04-07 DIAGNOSIS — Z9013 Acquired absence of bilateral breasts and nipples: Secondary | ICD-10-CM | POA: Diagnosis not present

## 2023-04-07 DIAGNOSIS — E7800 Pure hypercholesterolemia, unspecified: Secondary | ICD-10-CM | POA: Diagnosis not present

## 2023-04-07 DIAGNOSIS — Z79890 Hormone replacement therapy (postmenopausal): Secondary | ICD-10-CM | POA: Insufficient documentation

## 2023-04-07 DIAGNOSIS — Z8049 Family history of malignant neoplasm of other genital organs: Secondary | ICD-10-CM | POA: Insufficient documentation

## 2023-04-07 DIAGNOSIS — Z9221 Personal history of antineoplastic chemotherapy: Secondary | ICD-10-CM | POA: Insufficient documentation

## 2023-04-07 DIAGNOSIS — E039 Hypothyroidism, unspecified: Secondary | ICD-10-CM | POA: Insufficient documentation

## 2023-04-07 DIAGNOSIS — Z79899 Other long term (current) drug therapy: Secondary | ICD-10-CM | POA: Insufficient documentation

## 2023-04-07 DIAGNOSIS — Z7902 Long term (current) use of antithrombotics/antiplatelets: Secondary | ICD-10-CM | POA: Insufficient documentation

## 2023-04-07 DIAGNOSIS — E538 Deficiency of other specified B group vitamins: Secondary | ICD-10-CM | POA: Insufficient documentation

## 2023-04-07 DIAGNOSIS — C50412 Malignant neoplasm of upper-outer quadrant of left female breast: Secondary | ICD-10-CM

## 2023-04-07 DIAGNOSIS — M8580 Other specified disorders of bone density and structure, unspecified site: Secondary | ICD-10-CM | POA: Insufficient documentation

## 2023-04-07 DIAGNOSIS — Z171 Estrogen receptor negative status [ER-]: Secondary | ICD-10-CM | POA: Insufficient documentation

## 2023-04-07 DIAGNOSIS — Z8781 Personal history of (healed) traumatic fracture: Secondary | ICD-10-CM | POA: Insufficient documentation

## 2023-04-07 DIAGNOSIS — I2510 Atherosclerotic heart disease of native coronary artery without angina pectoris: Secondary | ICD-10-CM | POA: Insufficient documentation

## 2023-04-07 DIAGNOSIS — J42 Unspecified chronic bronchitis: Secondary | ICD-10-CM | POA: Insufficient documentation

## 2023-04-07 DIAGNOSIS — D649 Anemia, unspecified: Secondary | ICD-10-CM | POA: Diagnosis not present

## 2023-04-07 DIAGNOSIS — N189 Chronic kidney disease, unspecified: Secondary | ICD-10-CM | POA: Diagnosis not present

## 2023-04-07 DIAGNOSIS — I252 Old myocardial infarction: Secondary | ICD-10-CM | POA: Insufficient documentation

## 2023-04-07 NOTE — Research (Cosign Needed)
Are there any labs that are clinically significant?  Yes []  OR No[x]   Please FORWARD back to me with any changes or follow up!   Chrystie Nose, MD, [facility], FACP  [place]  [facility]  Medical Director of the Advanced Lipid Disorders &  Cardiovascular Risk Reduction Clinic Diplomate of the American Board of Clinical Lipidology Attending Cardiologist  Direct Dial: [phone]  Fax: [phone]  Website:  [email or web address]   COAGULATION GROUP                      APTT           23.3         21.9-29.4 sec                                PT             9.9          9.7-12.3 sec                                 INR            0.9          Patient not taking                                                       oral anticoagulant:                                                      0.8 - 1.2                                                                Patient taking                                                          oral anticoagulant:                                                      2.0 - 3.0                                  ALT & INR                      ALT & INR      Criteria not met  AST & INR                      AST & INR      Criteria not met

## 2023-04-10 NOTE — Research (Addendum)
Are there any labs that are clinically significant?  Yes []  OR No[x]   Please FORWARD back to me with any changes or follow up!   Chrystie Nose, MD, [facility], FACP  [place]  [facility]  Medical Director of the Advanced Lipid Disorders &  Cardiovascular Risk Reduction Clinic Diplomate of the American Board of Clinical Lipidology Attending Cardiologist  Direct Dial: [phone]  Fax: [phone]  Website:  [email or web address]

## 2023-04-10 NOTE — Research (Addendum)
Are there any labs that are clinically significant?  Yes []  OR No[x]   Please FORWARD back to me with any changes or follow up!   Chrystie Nose, MD, [facility], FACP  [facility]  Medical Director of the Advanced Lipid Disorders &  Cardiovascular Risk Reduction Clinic Diplomate of the American Board of Clinical Lipidology Attending Cardiologist  Direct Dial: [phone]  Fax: [phone]  Website:  [email or web address]   CHEMISTRY PANEL                      Total Bili     0.5          0.2-1.2 mg/dL                                D-BilGen2      0.19         0.00-0.20 mg/dL                              Ind Bili       0.3          0.0-1.2 mg/dL                                Alk Phos       71           35-104 U/L                                   ALT (SGPT)     16           4-43 U/L                                    AST (SGOT)     17           8-40 U/L                                    Urea Nitr      13           4-34 mg/dL                                   Creatinine     0.57         0.35-1.45 mg/dL                              Calcium        9.0          8.3-10.6 mg/dL                              Total Prot     6.3          6.0-8.0 g/dL  Alb BCG        4.3          3.0-4.6 g/dL                                 CK             36           26-192 U/L                                   Sodium         139          135-145 mEq/L                                Potassium      4.3          3.5-5.2 mEq/L                                Bicarb         24.0         19.3-29.3 mEq/L                              Chloride       103          94-112 mEq/L                               ADJUSTED CALCIUM                      Adj Calc       9.0          8.3-10.6 mg/dL   HEMATOLOGY&DIFFERENTIAL PANEL                      HGB            13.2         11.5-15.8 g/dL                              HCT            38           34-48 %                                       RBC            3.8     L    3.9-5.5 x106/uL                              MCH            35      H    26-34 pg  MCHC           35           31-38 g/dL                                   RDW            13.3         12.0-15.0 %                                 RBC Morph      Normocytic                                                MCV            99           80-100 fL                                    WBC            3.64    L    3.80-10.70 x103/uL                           Neutrophil     2.11         1.96-7.23 x103/uL                           Bands          0.11         0.00-0.27 x103/uL                           Lymphocyte     0.95         0.80-3.00 x103/uL                           Monocytes      0.25         0.12-0.92 x103/uL                           Eosinophil     0.18         0.00-0.57 x103/uL                           Basophils      0.04         0.00-0.20 x103/uL                           Neutrophil     58.0         40.5-75.0 %                                 Bands          3.0  0.0-3.0 %                                    Lymphocyte     26.0         15.4-48.5%                                   Monocytes      7.0          2.6-10.1 %                                   Eosinophil     5.0          0.0-6.8 %                                    Basophils      1.0          0.0-2.0 %                                    Platelets      245          130-394 x103/uL                            ANC                      ANC            2.22         1.96-7.23 x103/uL

## 2023-04-12 ENCOUNTER — Encounter: Payer: Self-pay | Admitting: *Deleted

## 2023-04-12 ENCOUNTER — Ambulatory Visit
Admission: RE | Admit: 2023-04-12 | Discharge: 2023-04-12 | Disposition: A | Discharge status: Home / Self Care | Payer: Medicare Other | Source: Ambulatory Visit | Attending: Radiation Oncology | Admitting: Radiation Oncology

## 2023-04-12 DIAGNOSIS — Z510 Encounter for antineoplastic radiation therapy: Secondary | ICD-10-CM | POA: Diagnosis not present

## 2023-04-12 DIAGNOSIS — C50412 Malignant neoplasm of upper-outer quadrant of left female breast: Secondary | ICD-10-CM | POA: Diagnosis not present

## 2023-04-12 DIAGNOSIS — Z171 Estrogen receptor negative status [ER-]: Secondary | ICD-10-CM

## 2023-04-13 ENCOUNTER — Telehealth: Payer: Self-pay | Admitting: Radiation Oncology

## 2023-04-13 NOTE — Telephone Encounter (Signed)
 Pt called asking about r/s a certain tx appt already scheduled. Call transferred to Support RTT to assist with appt.

## 2023-04-17 ENCOUNTER — Encounter: Payer: Self-pay | Admitting: Radiation Oncology

## 2023-04-17 NOTE — Progress Notes (Signed)
 Patient has been contacted by radiation support therapist regarding scheduling questions for radiation appointments

## 2023-04-18 DIAGNOSIS — Z171 Estrogen receptor negative status [ER-]: Secondary | ICD-10-CM | POA: Diagnosis not present

## 2023-04-18 DIAGNOSIS — Z510 Encounter for antineoplastic radiation therapy: Secondary | ICD-10-CM | POA: Diagnosis not present

## 2023-04-18 DIAGNOSIS — C50412 Malignant neoplasm of upper-outer quadrant of left female breast: Secondary | ICD-10-CM | POA: Diagnosis not present

## 2023-04-19 ENCOUNTER — Ambulatory Visit
Admission: RE | Admit: 2023-04-19 | Discharge: 2023-04-19 | Disposition: A | Discharge status: Home / Self Care | Payer: Medicare Other | Source: Ambulatory Visit | Attending: Radiation Oncology | Admitting: Radiation Oncology

## 2023-04-19 ENCOUNTER — Other Ambulatory Visit: Payer: Self-pay

## 2023-04-19 DIAGNOSIS — Z171 Estrogen receptor negative status [ER-]: Secondary | ICD-10-CM | POA: Diagnosis not present

## 2023-04-19 DIAGNOSIS — Z510 Encounter for antineoplastic radiation therapy: Secondary | ICD-10-CM | POA: Diagnosis not present

## 2023-04-19 DIAGNOSIS — C50412 Malignant neoplasm of upper-outer quadrant of left female breast: Secondary | ICD-10-CM | POA: Diagnosis not present

## 2023-04-19 LAB — RAD ONC ARIA SESSION SUMMARY
Course Elapsed Days: 0
Plan Fractions Treated to Date: 1
Plan Prescribed Dose Per Fraction: 2.67 Gy
Plan Total Fractions Prescribed: 8
Plan Total Prescribed Dose: 21.36 Gy
Reference Point Dosage Given to Date: 2.67 Gy
Reference Point Session Dosage Given: 2.67 Gy
Session Number: 1

## 2023-04-20 ENCOUNTER — Other Ambulatory Visit: Payer: Self-pay

## 2023-04-20 ENCOUNTER — Ambulatory Visit
Admission: RE | Admit: 2023-04-20 | Discharge: 2023-04-20 | Disposition: A | Discharge status: Home / Self Care | Payer: Medicare Other | Source: Ambulatory Visit | Attending: Radiation Oncology

## 2023-04-20 DIAGNOSIS — Z171 Estrogen receptor negative status [ER-]: Secondary | ICD-10-CM | POA: Diagnosis not present

## 2023-04-20 DIAGNOSIS — Z510 Encounter for antineoplastic radiation therapy: Secondary | ICD-10-CM | POA: Diagnosis not present

## 2023-04-20 DIAGNOSIS — C50412 Malignant neoplasm of upper-outer quadrant of left female breast: Secondary | ICD-10-CM | POA: Diagnosis not present

## 2023-04-20 LAB — RAD ONC ARIA SESSION SUMMARY
Course Elapsed Days: 1
Plan Fractions Treated to Date: 1
Plan Prescribed Dose Per Fraction: 2.67 Gy
Plan Total Fractions Prescribed: 7
Plan Total Prescribed Dose: 18.69 Gy
Reference Point Dosage Given to Date: 2.67 Gy
Reference Point Session Dosage Given: 2.67 Gy
Session Number: 2

## 2023-04-21 ENCOUNTER — Other Ambulatory Visit: Payer: Self-pay

## 2023-04-21 ENCOUNTER — Ambulatory Visit
Admission: RE | Admit: 2023-04-21 | Discharge: 2023-04-21 | Disposition: A | Discharge status: Home / Self Care | Payer: Medicare Other | Source: Ambulatory Visit | Attending: Radiation Oncology | Admitting: Radiation Oncology

## 2023-04-21 DIAGNOSIS — Z510 Encounter for antineoplastic radiation therapy: Secondary | ICD-10-CM | POA: Diagnosis not present

## 2023-04-21 DIAGNOSIS — C50412 Malignant neoplasm of upper-outer quadrant of left female breast: Secondary | ICD-10-CM | POA: Diagnosis not present

## 2023-04-21 LAB — RAD ONC ARIA SESSION SUMMARY
Course Elapsed Days: 2
Plan Fractions Treated to Date: 2
Plan Prescribed Dose Per Fraction: 2.67 Gy
Plan Total Fractions Prescribed: 8
Plan Total Prescribed Dose: 21.36 Gy
Reference Point Dosage Given to Date: 5.34 Gy
Reference Point Session Dosage Given: 2.67 Gy
Session Number: 3

## 2023-04-24 ENCOUNTER — Other Ambulatory Visit: Payer: Self-pay

## 2023-04-24 ENCOUNTER — Ambulatory Visit
Admission: RE | Admit: 2023-04-24 | Discharge: 2023-04-24 | Disposition: A | Discharge status: Home / Self Care | Payer: Medicare Other | Source: Ambulatory Visit | Attending: Radiation Oncology | Admitting: Radiation Oncology

## 2023-04-24 DIAGNOSIS — Z171 Estrogen receptor negative status [ER-]: Secondary | ICD-10-CM

## 2023-04-24 DIAGNOSIS — Z510 Encounter for antineoplastic radiation therapy: Secondary | ICD-10-CM | POA: Diagnosis not present

## 2023-04-24 DIAGNOSIS — C50412 Malignant neoplasm of upper-outer quadrant of left female breast: Secondary | ICD-10-CM | POA: Diagnosis not present

## 2023-04-24 LAB — RAD ONC ARIA SESSION SUMMARY
Course Elapsed Days: 5
Plan Fractions Treated to Date: 2
Plan Prescribed Dose Per Fraction: 2.67 Gy
Plan Total Fractions Prescribed: 7
Plan Total Prescribed Dose: 18.69 Gy
Reference Point Dosage Given to Date: 5.34 Gy
Reference Point Session Dosage Given: 2.67 Gy
Session Number: 4

## 2023-04-24 MED ORDER — ALRA NON-METALLIC DEODORANT (RAD-ONC)
1.0000 | Freq: Once | TOPICAL | Status: AC
  Administered 2023-04-24: 1 via TOPICAL

## 2023-04-24 MED ORDER — RADIAPLEXRX EX GEL: Freq: Once | CUTANEOUS | Status: AC

## 2023-04-25 ENCOUNTER — Ambulatory Visit
Admission: RE | Admit: 2023-04-25 | Discharge: 2023-04-25 | Disposition: A | Discharge status: Home / Self Care | Payer: Medicare Other | Source: Ambulatory Visit | Attending: Radiation Oncology

## 2023-04-25 ENCOUNTER — Other Ambulatory Visit: Payer: Self-pay

## 2023-04-25 DIAGNOSIS — C50412 Malignant neoplasm of upper-outer quadrant of left female breast: Secondary | ICD-10-CM | POA: Diagnosis not present

## 2023-04-25 DIAGNOSIS — Z510 Encounter for antineoplastic radiation therapy: Secondary | ICD-10-CM | POA: Diagnosis not present

## 2023-04-25 DIAGNOSIS — Z171 Estrogen receptor negative status [ER-]: Secondary | ICD-10-CM | POA: Diagnosis not present

## 2023-04-25 LAB — RAD ONC ARIA SESSION SUMMARY
Course Elapsed Days: 6
Plan Fractions Treated to Date: 3
Plan Prescribed Dose Per Fraction: 2.67 Gy
Plan Total Fractions Prescribed: 8
Plan Total Prescribed Dose: 21.36 Gy
Reference Point Dosage Given to Date: 8.01 Gy
Reference Point Session Dosage Given: 2.67 Gy
Session Number: 5

## 2023-04-26 ENCOUNTER — Other Ambulatory Visit: Payer: Self-pay

## 2023-04-26 ENCOUNTER — Ambulatory Visit
Admission: RE | Admit: 2023-04-26 | Discharge: 2023-04-26 | Disposition: A | Discharge status: Home / Self Care | Payer: Medicare Other | Source: Ambulatory Visit | Attending: Radiation Oncology | Admitting: Radiation Oncology

## 2023-04-26 DIAGNOSIS — C50412 Malignant neoplasm of upper-outer quadrant of left female breast: Secondary | ICD-10-CM | POA: Diagnosis not present

## 2023-04-26 DIAGNOSIS — Z510 Encounter for antineoplastic radiation therapy: Secondary | ICD-10-CM | POA: Diagnosis not present

## 2023-04-26 DIAGNOSIS — Z171 Estrogen receptor negative status [ER-]: Secondary | ICD-10-CM | POA: Diagnosis not present

## 2023-04-26 LAB — RAD ONC ARIA SESSION SUMMARY
Course Elapsed Days: 7
Plan Fractions Treated to Date: 3
Plan Prescribed Dose Per Fraction: 2.67 Gy
Plan Total Fractions Prescribed: 7
Plan Total Prescribed Dose: 18.69 Gy
Reference Point Dosage Given to Date: 8.01 Gy
Reference Point Session Dosage Given: 2.67 Gy
Session Number: 6

## 2023-04-27 ENCOUNTER — Ambulatory Visit: Payer: Medicare Other

## 2023-04-28 ENCOUNTER — Other Ambulatory Visit: Payer: Self-pay

## 2023-04-28 ENCOUNTER — Ambulatory Visit
Admission: RE | Admit: 2023-04-28 | Discharge: 2023-04-28 | Disposition: A | Discharge status: Home / Self Care | Payer: Medicare Other | Source: Ambulatory Visit | Attending: Radiation Oncology | Admitting: Radiation Oncology

## 2023-04-28 ENCOUNTER — Encounter: Payer: Self-pay | Admitting: Hematology and Oncology

## 2023-04-28 DIAGNOSIS — Z510 Encounter for antineoplastic radiation therapy: Secondary | ICD-10-CM | POA: Diagnosis not present

## 2023-04-28 DIAGNOSIS — Z171 Estrogen receptor negative status [ER-]: Secondary | ICD-10-CM | POA: Diagnosis not present

## 2023-04-28 DIAGNOSIS — C50412 Malignant neoplasm of upper-outer quadrant of left female breast: Secondary | ICD-10-CM | POA: Diagnosis not present

## 2023-04-28 LAB — RAD ONC ARIA SESSION SUMMARY
Course Elapsed Days: 9
Plan Fractions Treated to Date: 4
Plan Prescribed Dose Per Fraction: 2.67 Gy
Plan Total Fractions Prescribed: 8
Plan Total Prescribed Dose: 21.36 Gy
Reference Point Dosage Given to Date: 10.68 Gy
Reference Point Session Dosage Given: 2.67 Gy
Session Number: 7

## 2023-05-01 ENCOUNTER — Ambulatory Visit
Admission: RE | Admit: 2023-05-01 | Discharge: 2023-05-01 | Disposition: A | Discharge status: Home / Self Care | Payer: Medicare Other | Source: Ambulatory Visit | Attending: Radiation Oncology | Admitting: Radiation Oncology

## 2023-05-01 ENCOUNTER — Ambulatory Visit: Admission: RE | Admit: 2023-05-01 | Payer: Medicare Other | Source: Ambulatory Visit

## 2023-05-01 ENCOUNTER — Other Ambulatory Visit: Payer: Self-pay

## 2023-05-01 DIAGNOSIS — Z510 Encounter for antineoplastic radiation therapy: Secondary | ICD-10-CM | POA: Diagnosis not present

## 2023-05-01 DIAGNOSIS — Z171 Estrogen receptor negative status [ER-]: Secondary | ICD-10-CM | POA: Diagnosis not present

## 2023-05-01 DIAGNOSIS — C50412 Malignant neoplasm of upper-outer quadrant of left female breast: Secondary | ICD-10-CM | POA: Diagnosis not present

## 2023-05-01 DIAGNOSIS — Z95828 Presence of other vascular implants and grafts: Secondary | ICD-10-CM | POA: Diagnosis not present

## 2023-05-01 LAB — RAD ONC ARIA SESSION SUMMARY
Course Elapsed Days: 12
Plan Fractions Treated to Date: 4
Plan Prescribed Dose Per Fraction: 2.67 Gy
Plan Total Fractions Prescribed: 7
Plan Total Prescribed Dose: 18.69 Gy
Reference Point Dosage Given to Date: 10.68 Gy
Reference Point Session Dosage Given: 2.67 Gy
Session Number: 8

## 2023-05-02 ENCOUNTER — Other Ambulatory Visit: Payer: Self-pay

## 2023-05-02 ENCOUNTER — Ambulatory Visit
Admission: RE | Admit: 2023-05-02 | Discharge: 2023-05-02 | Disposition: A | Discharge status: Home / Self Care | Payer: Medicare Other | Source: Ambulatory Visit | Attending: Radiation Oncology | Admitting: Radiation Oncology

## 2023-05-02 ENCOUNTER — Telehealth: Payer: Self-pay | Admitting: *Deleted

## 2023-05-02 DIAGNOSIS — Z171 Estrogen receptor negative status [ER-]: Secondary | ICD-10-CM | POA: Diagnosis not present

## 2023-05-02 DIAGNOSIS — C50412 Malignant neoplasm of upper-outer quadrant of left female breast: Secondary | ICD-10-CM | POA: Diagnosis not present

## 2023-05-02 DIAGNOSIS — Z510 Encounter for antineoplastic radiation therapy: Secondary | ICD-10-CM | POA: Diagnosis not present

## 2023-05-02 LAB — RAD ONC ARIA SESSION SUMMARY
Course Elapsed Days: 13
Plan Fractions Treated to Date: 5
Plan Prescribed Dose Per Fraction: 2.67 Gy
Plan Total Fractions Prescribed: 8
Plan Total Prescribed Dose: 21.36 Gy
Reference Point Dosage Given to Date: 13.35 Gy
Reference Point Session Dosage Given: 2.67 Gy
Session Number: 9

## 2023-05-02 NOTE — Telephone Encounter (Signed)
 Tried Calling patient, LMOM to return call.

## 2023-05-02 NOTE — Telephone Encounter (Signed)
 [facility] patient Has an appointment 05/11/2023 For Prolia Injection.  Patient needs updated labwork and needs orders placed and a Lab appointment.   Please Advise.

## 2023-05-02 NOTE — Telephone Encounter (Signed)
 She did have Labs done in 01/25 but her Calcium was low Can you schedule her for lab appointment before her Prolia shot and I will order the Lab ? Also she needs to come for follow up with me I have not seen her for more then 6 months

## 2023-05-03 ENCOUNTER — Other Ambulatory Visit: Payer: Self-pay

## 2023-05-03 ENCOUNTER — Telehealth: Payer: Self-pay

## 2023-05-03 ENCOUNTER — Ambulatory Visit
Admission: RE | Admit: 2023-05-03 | Discharge: 2023-05-03 | Disposition: A | Discharge status: Home / Self Care | Payer: Medicare Other | Source: Ambulatory Visit | Attending: Radiation Oncology | Admitting: Radiation Oncology

## 2023-05-03 DIAGNOSIS — C50412 Malignant neoplasm of upper-outer quadrant of left female breast: Secondary | ICD-10-CM | POA: Diagnosis not present

## 2023-05-03 DIAGNOSIS — Z171 Estrogen receptor negative status [ER-]: Secondary | ICD-10-CM | POA: Diagnosis not present

## 2023-05-03 DIAGNOSIS — Z510 Encounter for antineoplastic radiation therapy: Secondary | ICD-10-CM | POA: Diagnosis not present

## 2023-05-03 LAB — RAD ONC ARIA SESSION SUMMARY
Course Elapsed Days: 14
Plan Fractions Treated to Date: 5
Plan Prescribed Dose Per Fraction: 2.67 Gy
Plan Total Fractions Prescribed: 7
Plan Total Prescribed Dose: 18.69 Gy
Reference Point Dosage Given to Date: 13.35 Gy
Reference Point Session Dosage Given: 2.67 Gy
Session Number: 10

## 2023-05-03 NOTE — Telephone Encounter (Signed)
   Pre-operative Risk Assessment    Patient Name: Jessica [name]  DOB: 1940-05-08 MRN: [id number]   Date of last office visit: 02/06/2024 Date of next office visit: NA   Request for Surgical Clearance    Procedure:   Port removal surgery     Date of Surgery:  Clearance TBD                                 Surgeon:  Harriette Bouillon, MD Surgeon's Group or Practice Name:  [facility] Phone number:  [phone] Fax number:  [phone]   Type of Clearance Requested:   - Medical  - Pharmacy:  Hold Clopidogrel (Plavix) preoperatively   Type of Anesthesia:  General    Additional requests/questions:    Elyse Jarvis   05/03/2023, 1:33 PM

## 2023-05-04 ENCOUNTER — Telehealth: Payer: Self-pay

## 2023-05-04 ENCOUNTER — Ambulatory Visit
Admission: RE | Admit: 2023-05-04 | Discharge: 2023-05-04 | Disposition: A | Discharge status: Home / Self Care | Payer: Medicare Other | Source: Ambulatory Visit | Attending: Radiation Oncology | Admitting: Radiation Oncology

## 2023-05-04 ENCOUNTER — Other Ambulatory Visit: Payer: Self-pay

## 2023-05-04 DIAGNOSIS — C50412 Malignant neoplasm of upper-outer quadrant of left female breast: Secondary | ICD-10-CM | POA: Diagnosis not present

## 2023-05-04 DIAGNOSIS — Z510 Encounter for antineoplastic radiation therapy: Secondary | ICD-10-CM | POA: Diagnosis not present

## 2023-05-04 DIAGNOSIS — Z171 Estrogen receptor negative status [ER-]: Secondary | ICD-10-CM | POA: Diagnosis not present

## 2023-05-04 LAB — RAD ONC ARIA SESSION SUMMARY
Course Elapsed Days: 15
Plan Fractions Treated to Date: 6
Plan Prescribed Dose Per Fraction: 2.67 Gy
Plan Total Fractions Prescribed: 8
Plan Total Prescribed Dose: 21.36 Gy
Reference Point Dosage Given to Date: 16.02 Gy
Reference Point Session Dosage Given: 2.67 Gy
Session Number: 11

## 2023-05-04 NOTE — Telephone Encounter (Signed)
   Name: Jessica [name]  DOB: [birth year]  MRN: [id number]  Primary Cardiologist: Tonny Bollman, MD   Preoperative team, please contact this patient and set up a phone call appointment for further preoperative risk assessment. Please obtain consent and complete medication review. Thank you for your help.  I confirm that guidance regarding antiplatelet and oral anticoagulation therapy has been completed and, if necessary, noted below.  Per office protocol, if patient is without any new symptoms or concerns at the time of their virtual visit, she may hold Plavix for 5 days prior to procedure. Please resume Plavix as soon as possible postprocedure, at the discretion of the surgeon.  Patient should take Aspirin 81 mg daily throughout the perioperative period.  She should discontinue aspirin upon resumption of Plavix postprocedure.  I also confirmed the patient resides in the state of [place]. As per [facility] Medical Board telemedicine laws, the patient must reside in the state in which the provider is licensed.   Denyce Robert, NP 05/04/2023, 11:37 AM [facility]

## 2023-05-04 NOTE — Telephone Encounter (Signed)
 Pt scheduled for televisit on 05/05/23 at 10:40am. Med rec and consent on file    Patient Consent for Virtual Visit        Jessica [name] has provided verbal consent on 05/04/2023 for a virtual visit (video or telephone).   CONSENT FOR VIRTUAL VISIT FOR:  Jessica [name]  By participating in this virtual visit I agree to the following:  I hereby voluntarily request, consent and authorize [facility] and its employed or contracted physicians, physician assistants, nurse practitioners or other licensed health care professionals (the Practitioner), to provide me with telemedicine health care services (the "Services") as deemed necessary by the treating Practitioner. I acknowledge and consent to receive the Services by the Practitioner via telemedicine. I understand that the telemedicine visit will involve communicating with the Practitioner through live audiovisual communication technology and the disclosure of certain medical information by electronic transmission. I acknowledge that I have been given the opportunity to request an in-person assessment or other available alternative prior to the telemedicine visit and am voluntarily participating in the telemedicine visit.  I understand that I have the right to withhold or withdraw my consent to the use of telemedicine in the course of my care at any time, without affecting my right to future care or treatment, and that the Practitioner or I may terminate the telemedicine visit at any time. I understand that I have the right to inspect all information obtained and/or recorded in the course of the telemedicine visit and may receive copies of available information for a reasonable fee.  I understand that some of the potential risks of receiving the Services via telemedicine include:  Delay or interruption in medical evaluation due to technological equipment failure or disruption; Information transmitted may not be sufficient (e.g. poor  resolution of images) to allow for appropriate medical decision making by the Practitioner; and/or  In rare instances, security protocols could fail, causing a breach of personal health information.  Furthermore, I acknowledge that it is my responsibility to provide information about my medical history, conditions and care that is complete and accurate to the best of my ability. I acknowledge that Practitioner's advice, recommendations, and/or decision may be based on factors not within their control, such as incomplete or inaccurate data provided by me or distortions of diagnostic images or specimens that may result from electronic transmissions. I understand that the practice of medicine is not an exact science and that Practitioner makes no warranties or guarantees regarding treatment outcomes. I acknowledge that a copy of this consent can be made available to me via my patient portal [facility] MyChart), or I can request a printed copy by calling the office of [facility].    I understand that my insurance will be billed for this visit.   I have read or had this consent read to me. I understand the contents of this consent, which adequately explains the benefits and risks of the Services being provided via telemedicine.  I have been provided ample opportunity to ask questions regarding this consent and the Services and have had my questions answered to my satisfaction. I give my informed consent for the services to be provided through the use of telemedicine in my medical care

## 2023-05-04 NOTE — Telephone Encounter (Signed)
 Pt scheduled for televisit on 05/05/23 at 10:40am. Med rec and consent on file

## 2023-05-05 ENCOUNTER — Ambulatory Visit
Admission: RE | Admit: 2023-05-05 | Discharge: 2023-05-05 | Disposition: A | Discharge status: Home / Self Care | Payer: Medicare Other | Source: Ambulatory Visit | Attending: Radiation Oncology | Admitting: Radiation Oncology

## 2023-05-05 ENCOUNTER — Other Ambulatory Visit: Payer: Self-pay

## 2023-05-05 ENCOUNTER — Ambulatory Visit: Payer: Medicare Other | Attending: Internal Medicine | Admitting: Emergency Medicine

## 2023-05-05 DIAGNOSIS — Z01810 Encounter for preprocedural cardiovascular examination: Secondary | ICD-10-CM | POA: Diagnosis not present

## 2023-05-05 DIAGNOSIS — C50412 Malignant neoplasm of upper-outer quadrant of left female breast: Secondary | ICD-10-CM | POA: Diagnosis not present

## 2023-05-05 DIAGNOSIS — Z510 Encounter for antineoplastic radiation therapy: Secondary | ICD-10-CM | POA: Diagnosis not present

## 2023-05-05 DIAGNOSIS — Z171 Estrogen receptor negative status [ER-]: Secondary | ICD-10-CM | POA: Diagnosis not present

## 2023-05-05 LAB — RAD ONC ARIA SESSION SUMMARY
Course Elapsed Days: 16
Plan Fractions Treated to Date: 6
Plan Prescribed Dose Per Fraction: 2.67 Gy
Plan Total Fractions Prescribed: 7
Plan Total Prescribed Dose: 18.69 Gy
Reference Point Dosage Given to Date: 16.02 Gy
Reference Point Session Dosage Given: 2.67 Gy
Session Number: 12

## 2023-05-05 NOTE — Progress Notes (Signed)
 Virtual Visit via Telephone Note   Because of Jessica [name] co-morbid illnesses, she is at least at moderate risk for complications without adequate follow up.  This format is felt to be most appropriate for this patient at this time.  Due to technical limitations with video connection (technology), today's appointment will be conducted as an audio only telehealth visit, and Jessica [name] verbally agreed to proceed in this manner.   All issues noted in this document were discussed and addressed.  No physical exam could be performed with this format.  Evaluation Performed:  Preoperative cardiovascular risk assessment _____________   Date:  05/05/2023   Patient ID:  Jessica [name], DOB 23-Apr-1940, MRN [id number] Patient Location:  Home Provider location:   Office  Primary Care Provider:  Mahlon Gammon, MD Primary Cardiologist:  Tonny Bollman, MD  Chief Complaint / Patient Profile   83 y.o. y/o female with a h/o coronary artery disease, hyperlipidemia who is pending port removal surgery by [place] [facility] on date 05/09/2023 and presents today for telephonic preoperative cardiovascular risk assessment.  History of Present Illness    Jessica [name] is a 83 y.o. female who presents via audio/video conferencing for a telehealth visit today.  Pt was last seen in cardiology clinic on 02/06/2023 by Dr. Excell Seltzer.  At that time Jessica [name] was doing well.  The patient is now pending procedure as outlined above. Since her last visit, she denies chest pain, shortness of breath, lower extremity edema, fatigue, palpitations, weakness, presyncope, syncope, orthopnea, and PND.  Past Medical History    Past Medical History:  Diagnosis Date   Anemia 12/2015   Bronchitis, chronic (HCC)    in her 47s   Cataract    Chronic kidney disease    in 51s had nephritis   Closed fracture of right distal humerus    right   Coronary artery disease    Hand, foot and mouth disease  (HFMD)    Hearing loss 05/03/2016   High cholesterol    Hypothyroidism    Myocardial infarction (HCC)    Osteopenia    Pericarditis 01/1999   Pneumothorax 01/1999   Thyroid disorder    Vitamin B 12 deficiency    Past Surgical History:  Procedure Laterality Date   birthmark      removed,left leg   BREAST BIOPSY Left 09/13/2022   [place] LT BREAST BX W LOC DEV 1ST LESION IMG BX SPEC US GUIDE 09/13/2022 [facility]   CARDIAC CATHETERIZATION     CATARACT EXTRACTION  2017   CORONARY STENT INTERVENTION N/A 09/01/2020   Procedure: CORONARY STENT INTERVENTION;  Surgeon: Lyn Records, MD;  Location: [facility];  Service: Cardiovascular;  Laterality: N/A;   LEFT HEART CATH AND CORONARY ANGIOGRAPHY N/A 09/01/2020   Procedure: LEFT HEART CATH AND CORONARY ANGIOGRAPHY;  Surgeon: Lyn Records, MD;  Location: [facility];  Service: Cardiovascular;  Laterality: N/A;   ORIF HUMERUS FRACTURE Right 04/10/2019   Procedure: RIGHT OPEN REDUCTION INTERNAL FIXATION (ORIF) DISTAL HUMERUS FRACTURE WITH EXTENSION;  Surgeon: Bjorn Pippin, MD;  Location: [facility];  Service: Orthopedics;  Laterality: Right;   PORTACATH PLACEMENT N/A 11/15/2022   Procedure: PORT PLACEMENT WITH ULTRASOUND GUIDANCE;  Surgeon: Harriette Bouillon, MD;  Location: [facility] OR;  Service: General;  Laterality: N/A;   SIMPLE MASTECTOMY WITH AXILLARY SENTINEL NODE BIOPSY Bilateral 10/18/2022   Procedure: BILATERAL SIMPLE MASTECTOMY;  Surgeon: Harriette Bouillon, MD;  Location:  [facility];  Service: General;  Laterality: Bilateral;  PEC BLOCK   TONSILLECTOMY AND ADENOIDECTOMY  1952?    Allergies  No Known Allergies  Home Medications    Prior to Admission medications   Medication Sig Start Date End Date Taking? Authorizing Provider  acetaminophen (TYLENOL) 650 MG CR tablet Take 650 mg by mouth as needed for pain.    [provider]  Calcium Carb-Cholecalciferol (CALCIUM 600+D) 600-800  MG-UNIT TABS Take 3 tablets by mouth daily.    [provider]  clopidogrel (PLAVIX) 75 MG tablet TAKE 1 TABLET BY MOUTH DAILY 02/06/23   Tonny Bollman, MD  denosumab (PROLIA) 60 MG/ML SOSY injection Inject 60 mg into the skin every 6 (six) months. Administered at [facility] q102m    [provider]  ferrous sulfate 325 (65 FE) MG tablet Take 325 mg by mouth daily with breakfast.    [provider]  gabapentin (NEURONTIN) 100 MG capsule Take 100 mg by mouth daily.    [provider]  levothyroxine (SYNTHROID) 112 MCG tablet Take 1 tablet (112 mcg total) by mouth daily. 06/24/22   Mahlon Gammon, MD  lidocaine-prilocaine (EMLA) cream Apply 1 Application topically as needed. 2.5-2.5% as needed.    [provider]  loratadine (CLARITIN) 10 MG tablet Take 10 mg by mouth as needed for allergies.    [provider]  melatonin 5 MG TABS Take 5 mg by mouth at bedtime. 09/11/22   [provider]  nitroGLYCERIN (NITROSTAT) 0.4 MG SL tablet DISSOLVE 1 TAB UNDER TONGUE FOR CHEST PAIN - IF PAIN REMAINS AFTER 5 MIN, CALL 911 AND REPEAT DOSE. MAX 3 TABS IN 15 MINUTES 01/27/23   Tonny Bollman, MD  ondansetron (ZOFRAN) 8 MG tablet Take 1 tablet (8 mg total) by mouth every 8 (eight) hours as needed for nausea or vomiting. Start on the third day after chemotherapy. 11/03/22   Serena Croissant, MD  oxyCODONE (OXY IR/ROXICODONE) 5 MG immediate release tablet Take 1 tablet (5 mg total) by mouth every 6 (six) hours as needed for severe pain. 10/18/22   Cornett, Maisie Fus, MD  prochlorperazine (COMPAZINE) 10 MG tablet Take 1 tablet (10 mg total) by mouth every 6 (six) hours as needed for nausea or vomiting. 11/03/22   Serena Croissant, MD  rosuvastatin (CRESTOR) 20 MG tablet TAKE 1 TABLET BY MOUTH DAILY 01/02/23   Tonny Bollman, MD  Study - [name] - olpasiran (AMG 890) 142 mg/mL or placebo SQ injection (PI-Hilty) Inject 142 mg into the skin once. For  investigational use only - Inject 1 mL (1 prefilled syringe) subcutaneously into appropriate injection site per protocol. (Approved injection site(s): upper arm, upper thigh, abdomen). Please contact [facility] if any questions.    Chrystie Nose, MD  VITAMIN D PO Take 1 capsule by mouth daily.    [provider]  calcium carbonate (OS-CAL) 1250 (500 Ca) MG chewable tablet Chew 1 tablet (1,250 mg total) by mouth daily. 09/11/17 09/01/20  Oneal Grout, MD    Physical Exam    Vital Signs:  Jessica [name] does not have vital signs available for review today.  Given telephonic nature of communication, physical exam is limited. AAOx3. NAD. Normal affect.  Speech and respirations are unlabored.  Accessory Clinical Findings    None  Assessment & Plan    1.  Preoperative Cardiovascular Risk Assessment: According to the Revised Cardiac Risk Index (RCRI), her Perioperative Risk of Major Cardiac Event is (%):  0.9. Her Functional Capacity in METs is: 5.81 according to the Duke Activity Status Index (DASI). Therefore, based on ACC/AHA guidelines, patient would be at acceptable risk for the planned procedure without further cardiovascular testing.  The patient was advised that if she develops new symptoms prior to surgery to contact our office to arrange for a follow-up visit, and she verbalized understanding.  She may hold Plavix for 5 days prior to procedure. Please resume Plavix as soon as possible postprocedure, at the discretion of the surgeon.  Patient should take Aspirin 81 mg daily throughout the perioperative period.  She should discontinue aspirin upon resumption of Plavix postprocedure.   A copy of this note will be routed to requesting surgeon.  Time:   Today, I have spent 9 minutes with the patient with telehealth technology discussing medical history, symptoms, and management plan.     Denyce Robert, NP  05/05/2023, 8:01 AM

## 2023-05-08 ENCOUNTER — Ambulatory Visit
Admission: RE | Admit: 2023-05-08 | Discharge: 2023-05-08 | Disposition: A | Discharge status: Home / Self Care | Payer: Medicare Other | Source: Ambulatory Visit | Attending: Radiation Oncology | Admitting: Radiation Oncology

## 2023-05-08 ENCOUNTER — Other Ambulatory Visit: Payer: Self-pay

## 2023-05-08 DIAGNOSIS — Z510 Encounter for antineoplastic radiation therapy: Secondary | ICD-10-CM | POA: Diagnosis not present

## 2023-05-08 DIAGNOSIS — C50412 Malignant neoplasm of upper-outer quadrant of left female breast: Secondary | ICD-10-CM | POA: Insufficient documentation

## 2023-05-08 DIAGNOSIS — Z171 Estrogen receptor negative status [ER-]: Secondary | ICD-10-CM | POA: Diagnosis not present

## 2023-05-08 LAB — RAD ONC ARIA SESSION SUMMARY
Course Elapsed Days: 19
Plan Fractions Treated to Date: 7
Plan Prescribed Dose Per Fraction: 2.67 Gy
Plan Total Fractions Prescribed: 8
Plan Total Prescribed Dose: 21.36 Gy
Reference Point Dosage Given to Date: 18.69 Gy
Reference Point Session Dosage Given: 2.67 Gy
Session Number: 13

## 2023-05-09 ENCOUNTER — Ambulatory Visit: Payer: Medicare Other

## 2023-05-09 DIAGNOSIS — Z452 Encounter for adjustment and management of vascular access device: Secondary | ICD-10-CM | POA: Diagnosis not present

## 2023-05-10 ENCOUNTER — Ambulatory Visit
Admission: RE | Admit: 2023-05-10 | Discharge: 2023-05-10 | Disposition: A | Discharge status: Home / Self Care | Payer: Medicare Other | Source: Ambulatory Visit | Attending: Radiation Oncology

## 2023-05-10 ENCOUNTER — Other Ambulatory Visit: Payer: Self-pay

## 2023-05-10 ENCOUNTER — Ambulatory Visit: Payer: Medicare Other

## 2023-05-10 DIAGNOSIS — C50412 Malignant neoplasm of upper-outer quadrant of left female breast: Secondary | ICD-10-CM | POA: Diagnosis not present

## 2023-05-10 DIAGNOSIS — Z171 Estrogen receptor negative status [ER-]: Secondary | ICD-10-CM | POA: Diagnosis not present

## 2023-05-10 DIAGNOSIS — Z510 Encounter for antineoplastic radiation therapy: Secondary | ICD-10-CM | POA: Diagnosis not present

## 2023-05-10 LAB — RAD ONC ARIA SESSION SUMMARY
Course Elapsed Days: 21
Plan Fractions Treated to Date: 7
Plan Prescribed Dose Per Fraction: 2.67 Gy
Plan Total Fractions Prescribed: 7
Plan Total Prescribed Dose: 18.69 Gy
Reference Point Dosage Given to Date: 18.69 Gy
Reference Point Session Dosage Given: 2.67 Gy
Session Number: 14

## 2023-05-11 ENCOUNTER — Ambulatory Visit: Payer: Medicare Other

## 2023-05-11 ENCOUNTER — Ambulatory Visit
Admission: RE | Admit: 2023-05-11 | Discharge: 2023-05-11 | Disposition: A | Discharge status: Home / Self Care | Payer: Medicare Other | Source: Ambulatory Visit | Attending: Radiation Oncology | Admitting: Radiation Oncology

## 2023-05-11 ENCOUNTER — Other Ambulatory Visit: Payer: Self-pay

## 2023-05-11 DIAGNOSIS — M810 Age-related osteoporosis without current pathological fracture: Secondary | ICD-10-CM

## 2023-05-11 DIAGNOSIS — Z171 Estrogen receptor negative status [ER-]: Secondary | ICD-10-CM | POA: Diagnosis not present

## 2023-05-11 DIAGNOSIS — Z510 Encounter for antineoplastic radiation therapy: Secondary | ICD-10-CM | POA: Diagnosis not present

## 2023-05-11 DIAGNOSIS — C50412 Malignant neoplasm of upper-outer quadrant of left female breast: Secondary | ICD-10-CM | POA: Diagnosis not present

## 2023-05-11 LAB — RAD ONC ARIA SESSION SUMMARY
Course Elapsed Days: 22
Plan Fractions Treated to Date: 8
Plan Prescribed Dose Per Fraction: 2.67 Gy
Plan Total Fractions Prescribed: 8
Plan Total Prescribed Dose: 21.36 Gy
Reference Point Dosage Given to Date: 21.36 Gy
Reference Point Session Dosage Given: 2.67 Gy
Session Number: 15

## 2023-05-11 MED ORDER — DENOSUMAB 60 MG/ML ~~LOC~~ SOSY
60.0000 mg | PREFILLED_SYRINGE | SUBCUTANEOUS | Status: AC
  Administered 2023-11-13: 60 mg via SUBCUTANEOUS

## 2023-05-11 NOTE — Telephone Encounter (Signed)
 Tried contacting patient but patient came in for the appointment for Prolia and received today.   Called patient and scheduled a Follow up appointment at [facility] with Dr. Chales Abrahams.

## 2023-05-12 ENCOUNTER — Other Ambulatory Visit: Payer: Self-pay

## 2023-05-12 NOTE — Radiation Completion Notes (Signed)
 Patient Name: Jessica [name], Jessica [name] MRN: [id number] Date of Birth: Jan 31, 1941 Referring Physician: Serena Croissant, M.D. Date of Service: 2023-05-12 Radiation Oncologist: Lonie Peak, M.D. 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 Staging on 2022-09-21: Malignant neoplasm of upper-outer quadrant of left breast in female, estrogen receptor negative (HCC) T=cT2, N=cN0, M=cM0 Intent: Curative     ==========DELIVERED PLANS==========  First Treatment Date: 2023-04-19 Last Treatment Date: 2023-05-11   Plan Name: CW_L_BH_BO Site: Chest Wall, Left Technique: 3D Mode: Photon Dose Per Fraction: 2.67 Gy Prescribed Dose (Delivered / Prescribed): 21.36 Gy / 21.36 Gy Prescribed Fxs (Delivered / Prescribed): 8 / 8   Plan Name: CW_L_BH Site: Chest Wall, Left Technique: 3D Mode: Photon Dose Per Fraction: 2.67 Gy Prescribed Dose (Delivered / Prescribed): 18.69 Gy / 18.69 Gy Prescribed Fxs (Delivered / Prescribed): 7 / 7     ==========ON TREATMENT VISIT DATES========== 2023-04-24, 2023-05-01, 2023-05-08     ==========UPCOMING VISITS========== 07/03/2023 [facility] RESEARCH RESEARCH 45 [facility] 2  06/15/2023 [facility]-RADIATION ONC FOLLOW UP 20 Lonie Peak, MD  05/31/2023 [facility] 30 Mahlon Gammon, MD        ==========APPENDIX - ON TREATMENT VISIT NOTES==========   See weekly On Treatment Notes in Epic for details in the Media tab (listed as Progress notes on the On Treatment Visit Dates listed above).

## 2023-05-16 DIAGNOSIS — Z23 Encounter for immunization: Secondary | ICD-10-CM | POA: Diagnosis not present

## 2023-05-18 ENCOUNTER — Telehealth: Payer: Self-pay | Admitting: Adult Health

## 2023-05-19 ENCOUNTER — Other Ambulatory Visit: Payer: Self-pay | Admitting: Internal Medicine

## 2023-05-19 ENCOUNTER — Other Ambulatory Visit: Payer: Self-pay | Admitting: *Deleted

## 2023-05-19 MED ORDER — GABAPENTIN 100 MG PO CAPS: 100.0000 mg | ORAL_CAPSULE | Freq: Every day | ORAL | 0 refills | Status: DC

## 2023-05-19 NOTE — Telephone Encounter (Signed)
 Pharmacy requested refill

## 2023-05-31 ENCOUNTER — Encounter: Admitting: Internal Medicine

## 2023-06-05 ENCOUNTER — Encounter: Admitting: *Deleted

## 2023-06-05 DIAGNOSIS — Z006 Encounter for examination for normal comparison and control in clinical research program: Secondary | ICD-10-CM

## 2023-06-05 MED ORDER — STUDY - OCEAN(A) - OLPASIRAN (AMG 890) 142 MG/ML OR PLACEBO SQ INJECTION (PI-HILTY)
142.0000 mg | PREFILLED_SYRINGE | Freq: Once | SUBCUTANEOUS | Status: AC
  Administered 2023-06-05: 142 mg via SUBCUTANEOUS
  Filled 2023-06-05: qty 1

## 2023-06-05 NOTE — Research (Signed)
 [name]  Week 108  Vitals: [x]  Experience any AE/SAE/Hospitalizations []  Yes [x]  No  If yes please explain:  Labs collected:  no labs  Discussed with patient about the importance of not letting any one draw cholesterol or lipids. As to we are blinded to results. Reassured patient if we needed to be contacted the study team would reach out to our unblinded person.   ~reminder labs next visit 120 ~  Non-Fatal Potential Endpoint Assessment Yes  No   Has the subject experienced/undergone any of the following since the last visit/contact?   []   [x]    Any Coronary Artery Revascularization/Cerebrovascular Revascularization/ Peripheral Artery Revascularization/Amputation Procedure   []   [x]    Myocardial Infarction []   [x]    Stroke   []   [x]    Provide the date for the non-fatal Potential Endpoints status:   []   [x]     IP admin please see MAR (please add Box # to comment section on MAR) [x]    Current Outpatient Medications:    acetaminophen (TYLENOL) 650 MG CR tablet, Take 650 mg by mouth as needed for pain., Disp: , Rfl:    Calcium Carb-Cholecalciferol (CALCIUM 600+D) 600-800 MG-UNIT TABS, Take 3 tablets by mouth daily., Disp: , Rfl:    clobetasol ointment (TEMOVATE) 0.05 %, Apply 1 Application topically 2 (two) times daily., Disp: , Rfl:    clopidogrel (PLAVIX) 75 MG tablet, TAKE 1 TABLET BY MOUTH DAILY, Disp: 90 tablet, Rfl: 1   denosumab (PROLIA) 60 MG/ML SOSY injection, Inject 60 mg into the skin every 6 (six) months. Administered at [facility] q19m, Disp: , Rfl:    ferrous sulfate 325 (65 FE) MG tablet, Take 325 mg by mouth daily with breakfast., Disp: , Rfl:    gabapentin (NEURONTIN) 100 MG capsule, Take 1 capsule (100 mg total) by mouth daily., Disp: 90 capsule, Rfl: 0   levothyroxine (SYNTHROID) 112 MCG tablet, Take 1 tablet (112 mcg total) by mouth daily., Disp: 90 tablet, Rfl: 3   loratadine (CLARITIN) 10 MG tablet, Take 10 mg by mouth as needed for allergies., Disp: ,  Rfl:    melatonin 5 MG TABS, Take 5 mg by mouth at bedtime., Disp: , Rfl:    nitroGLYCERIN (NITROSTAT) 0.4 MG SL tablet, DISSOLVE 1 TAB UNDER TONGUE FOR CHEST PAIN - IF PAIN REMAINS AFTER 5 MIN, CALL 911 AND REPEAT DOSE. MAX 3 TABS IN 15 MINUTES, Disp: 25 tablet, Rfl: 6   rosuvastatin (CRESTOR) 20 MG tablet, TAKE 1 TABLET BY MOUTH DAILY, Disp: 90 tablet, Rfl: 3   Study - [name] - olpasiran (AMG 890) 142 mg/mL or placebo SQ injection (PI-Hilty), Inject 142 mg into the skin once. For investigational use only - Inject 1 mL (1 prefilled syringe) subcutaneously into appropriate injection site per protocol. (Approved injection site(s): upper arm, upper thigh, abdomen). Please contact  Cardiology Research if any questions., Disp: , Rfl:    VITAMIN D PO, Take 1 capsule by mouth daily., Disp: , Rfl:    lidocaine-prilocaine (EMLA) cream, Apply 1 Application topically as needed. 2.5-2.5% as needed., Disp: , Rfl:   Current Facility-Administered Medications:    [START ON 11/13/2023] denosumab (PROLIA) injection 60 mg, 60 mg, Subcutaneous, Q6 months, Mahlon Gammon, MD

## 2023-06-06 NOTE — Progress Notes (Addendum)
 Jessica [name] is here today for at telephone follow up post radiation to the breast.   Breast Side:05/11/2023  Patient is healing well but is experiencing some fatigue    They completed their radiation on: 05/11/2023   Does the patient complain of any of the following: Post radiation skin issues: Yes, patient Breast Tenderness: Slight tenderness Breast Swelling: Yes Lymphadema: None Range of Motion limitations: Yes, slight limitations on the left arm Fatigue post radiation: Yes, educated patient Appetite good/fair/poor: Patient has a normal diet.  Additional comments if applicable:  None.

## 2023-06-07 ENCOUNTER — Encounter: Payer: Self-pay | Admitting: Internal Medicine

## 2023-06-07 ENCOUNTER — Non-Acute Institutional Stay: Admitting: Internal Medicine

## 2023-06-07 VITALS — BP 122/80 | HR 60 | Temp 96.0°F | Resp 16 | Ht 63.0 in | Wt 154.0 lb

## 2023-06-07 DIAGNOSIS — C50412 Malignant neoplasm of upper-outer quadrant of left female breast: Secondary | ICD-10-CM

## 2023-06-07 DIAGNOSIS — G629 Polyneuropathy, unspecified: Secondary | ICD-10-CM | POA: Diagnosis not present

## 2023-06-07 DIAGNOSIS — E039 Hypothyroidism, unspecified: Secondary | ICD-10-CM

## 2023-06-07 DIAGNOSIS — M810 Age-related osteoporosis without current pathological fracture: Secondary | ICD-10-CM | POA: Diagnosis not present

## 2023-06-07 DIAGNOSIS — I214 Non-ST elevation (NSTEMI) myocardial infarction: Secondary | ICD-10-CM

## 2023-06-07 DIAGNOSIS — E782 Mixed hyperlipidemia: Secondary | ICD-10-CM

## 2023-06-07 DIAGNOSIS — Z171 Estrogen receptor negative status [ER-]: Secondary | ICD-10-CM

## 2023-06-07 DIAGNOSIS — R739 Hyperglycemia, unspecified: Secondary | ICD-10-CM | POA: Diagnosis not present

## 2023-06-07 DIAGNOSIS — K429 Umbilical hernia without obstruction or gangrene: Secondary | ICD-10-CM

## 2023-06-08 NOTE — Progress Notes (Signed)
 Location:  [facility] Service:  Clinic (12)  Provider:   Code Status:  Goals of Care:     06/07/2023   11:04 AM  Advanced Directives  Does Patient Have a Medical Advance Directive? Yes  Type of Estate agent of [place];Living will  Does patient want to make changes to medical advance directive? No - Patient declined  Copy of Healthcare Power of Attorney in Chart? No - copy requested     Chief Complaint  Patient presents with   Medical Management of Chronic Issues    Follow up. Discuss the need for Covid Booster. Patient needs LABS before prolia injection.     HPI: Patient is a 83 y.o. female seen today for medical management of chronic diseases.    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  History of Present Illness  . She recently completed a course of treatment for an aggressive form of breast cancer, which included surgery, chemotherapy, and radiation. She reports that she tolerated the treatments well, with minimal side effects such as nausea or vomiting. She did experience significant weakness and loss of strength, which was exacerbated by the radiation treatment. She is currently concerned about her cancer status and is scheduled to discuss follow-up care with her oncologist's PA.  In addition to her cancer treatment, the patient has been participating in a clinical trial for heart medication. She reports no significant issues with her heart during her cancer treatment.  The patient also reports issues with an umbilical hernia. She has noticed it popping in and out more frequently in the past month, sometimes accompanied by pain. She has been able to push it back  in each time, which alleviates the pain. She is considering surgical intervention for the hernia and is scheduled to see a surgeon for a consultation.  The patient also has a history of osteoporosis and is on Prolia. She is due for her next shot in September.      Past Medical History:  Diagnosis Date   Anemia 12/2015   Bronchitis, chronic (HCC)    in her 75s   Cataract    Chronic kidney disease    in 29s had nephritis   Closed fracture of right distal humerus    right   Coronary artery disease    Hand, foot and mouth disease (HFMD)    Hearing loss 05/03/2016   High cholesterol    Hypothyroidism    Myocardial infarction (HCC)    Osteopenia    Pericarditis 01/1999   Pneumothorax 01/1999   Thyroid disorder    Vitamin B 12 deficiency     Past Surgical History:  Procedure Laterality Date   birthmark      removed,left leg   BREAST BIOPSY Left 09/13/2022   [place] LT BREAST BX W LOC DEV 1ST LESION IMG BX SPEC US GUIDE 09/13/2022 [facility]   CARDIAC CATHETERIZATION     CATARACT EXTRACTION  2017   CORONARY STENT INTERVENTION N/A 09/01/2020   Procedure: CORONARY STENT INTERVENTION;  Surgeon: Lyn Records, MD;  Location: [facility] INVASIVE CV [facility];  Service: Cardiovascular;  Laterality: N/A;   LEFT HEART CATH AND CORONARY ANGIOGRAPHY N/A 09/01/2020  Procedure: LEFT HEART CATH AND CORONARY ANGIOGRAPHY;  Surgeon: Lyn Records, MD;  Location: [facility] INVASIVE CV LAB;  Service: Cardiovascular;  Laterality: N/A;   ORIF HUMERUS FRACTURE Right 04/10/2019   Procedure: RIGHT OPEN REDUCTION INTERNAL FIXATION (ORIF) DISTAL HUMERUS FRACTURE WITH EXTENSION;  Surgeon: Bjorn Pippin, MD;  Location: [facility];  Service: Orthopedics;  Laterality: Right;   PORTACATH PLACEMENT N/A 11/15/2022   Procedure: PORT PLACEMENT WITH ULTRASOUND GUIDANCE;  Surgeon: Harriette Bouillon, MD;  Location: [facility] OR;  Service: General;  Laterality: N/A;   SIMPLE MASTECTOMY WITH AXILLARY SENTINEL NODE BIOPSY Bilateral  10/18/2022   Procedure: BILATERAL SIMPLE MASTECTOMY;  Surgeon: Harriette Bouillon, MD;  Location:  SURGERY CENTER;  Service: General;  Laterality: Bilateral;  PEC BLOCK   TONSILLECTOMY AND ADENOIDECTOMY  1951?    No Known Allergies  Outpatient Encounter Medications as of 06/07/2023  Medication Sig   acetaminophen (TYLENOL) 650 MG CR tablet Take 650 mg by mouth as needed for pain.   Calcium Carb-Cholecalciferol (CALCIUM 600+D) 600-800 MG-UNIT TABS Take 3 tablets by mouth daily.   clobetasol ointment (TEMOVATE) 0.05 % Apply 1 Application topically 2 (two) times daily.   clopidogrel (PLAVIX) 75 MG tablet TAKE 1 TABLET BY MOUTH DAILY   denosumab (PROLIA) 60 MG/ML SOSY injection Inject 60 mg into the skin every 6 (six) months. Administered at [facility] q19m   ferrous sulfate 325 (65 FE) MG tablet Take 325 mg by mouth daily with breakfast.   gabapentin (NEURONTIN) 100 MG capsule Take 1 capsule (100 mg total) by mouth daily.   levothyroxine (SYNTHROID) 112 MCG tablet Take 1 tablet (112 mcg total) by mouth daily.   loratadine (CLARITIN) 10 MG tablet Take 10 mg by mouth as needed for allergies.   melatonin 5 MG TABS Take 5 mg by mouth at bedtime.   nitroGLYCERIN (NITROSTAT) 0.4 MG SL tablet DISSOLVE 1 TAB UNDER TONGUE FOR CHEST PAIN - IF PAIN REMAINS AFTER 5 MIN, CALL 911 AND REPEAT DOSE. MAX 3 TABS IN 15 MINUTES   rosuvastatin (CRESTOR) 20 MG tablet TAKE 1 TABLET BY MOUTH DAILY   Study - [name] - olpasiran (AMG 890) 142 mg/mL or placebo SQ injection (PI-Hilty) Inject 142 mg into the skin once. For investigational use only - Inject 1 mL (1 prefilled syringe) subcutaneously into appropriate injection site per protocol. (Approved injection site(s): upper arm, upper thigh, abdomen). Please contact [facility] if any questions.   VITAMIN D PO Take 1 capsule by mouth daily.   lidocaine-prilocaine (EMLA) cream Apply 1 Application topically as needed. 2.5-2.5% as needed.    [DISCONTINUED] calcium carbonate (OS-CAL) 1250 (500 Ca) MG chewable tablet Chew 1 tablet (1,250 mg total) by mouth daily.   Facility-Administered Encounter Medications as of 06/07/2023  Medication   [START ON 11/13/2023] denosumab (PROLIA) injection 60 mg    Review of Systems:  Review of Systems  Constitutional:  Negative for activity change and appetite change.  HENT: Negative.    Respiratory:  Negative for cough and shortness of breath.   Cardiovascular:  Negative for leg swelling.  Gastrointestinal:  Negative for constipation.  Genitourinary: Negative.   Musculoskeletal:  Negative for arthralgias, gait problem and myalgias.  Skin: Negative.   Neurological:  Negative for dizziness and weakness.  Psychiatric/Behavioral:  Negative for confusion, dysphoric mood and sleep disturbance.     Health Maintenance  Topic Date Due   COVID-19 Vaccine (6 - 2024-25 season) 11/06/2022   Medicare Annual Wellness (AWV)  07/11/2023  Colonoscopy  02/03/2026 (Originally 07/11/2020)   INFLUENZA VACCINE  10/06/2023   DTaP/Tdap/Td (4 - Td or Tdap) 03/24/2032   Pneumonia Vaccine 51+ Years old  Completed   DEXA SCAN  Completed   Zoster Vaccines- Shingrix  Completed   HPV VACCINES  Aged Out    Physical Exam: Vitals:   06/07/23 1057  BP: 122/80  Pulse: 60  Resp: 16  Temp: (!) 96 F (35.6 C)  SpO2: 93%  Weight: 154 lb (69.9 kg)  Height: 5\' 3"  (1.6 m)   Body mass index is 27.28 kg/m. Physical Exam Vitals reviewed.  Constitutional:      Appearance: Normal appearance.  HENT:     Head: Normocephalic.     Nose: Nose normal.     Mouth/Throat:     Mouth: Mucous membranes are moist.     Pharynx: Oropharynx is clear.  Eyes:     Pupils: Pupils are equal, round, and reactive to light.  Cardiovascular:     Rate and Rhythm: Normal rate and regular rhythm.     Pulses: Normal pulses.     Heart sounds: Normal heart sounds. No murmur heard. Pulmonary:     Effort: Pulmonary effort is normal.      Breath sounds: Normal breath sounds.  Abdominal:     General: Abdomen is flat. Bowel sounds are normal.     Palpations: Abdomen is soft.     Hernia: A hernia is present.  Musculoskeletal:        General: No swelling.     Cervical back: Neck supple.  Skin:    General: Skin is warm.  Neurological:     General: No focal deficit present.     Mental Status: She is alert and oriented to person, place, and time.  Psychiatric:        Mood and Affect: Mood normal.        Thought Content: Thought content normal.     Labs reviewed: Basic Metabolic Panel: Recent Labs    01/25/23 1020 02/15/23 1057 03/15/23 1025  NA 139 140 139  K 4.2 4.1 4.3  CL 107 108 108  CO2 26 27 26   GLUCOSE 82 111* 93  BUN 14 15 12   CREATININE 0.61 0.60 0.56  CALCIUM 9.0 9.2 8.8*   Liver Function Tests: Recent Labs    01/25/23 1020 02/15/23 1057 03/15/23 1025  AST 18 16 17   ALT 13 15 16   ALKPHOS 57 58 59  BILITOT 0.5 0.5 0.6  PROT 6.6 6.4* 6.3*  ALBUMIN 4.2 4.1 4.0   No results for input(s): "LIPASE", "AMYLASE" in the last 8760 hours. No results for input(s): "AMMONIA" in the last 8760 hours. CBC: Recent Labs    01/25/23 1020 02/15/23 1057 03/15/23 1025  WBC 5.0 4.3 7.7  NEUTROABS 2.3 1.7 5.5  HGB 13.0 12.6 12.5  HCT 37.9 36.7 36.5  MCV 97.9 98.1 97.6  PLT 262 242 213   Lipid Panel: No results for input(s): "CHOL", "HDL", "LDLCALC", "TRIG", "CHOLHDL", "LDLDIRECT" in the last 8760 hours. Lab Results  Component Value Date   HGBA1C 5.9 (H) 04/11/2022    Procedures since last visit: No results found.  Assessment/Plan 1. Umbilical hernia without obstruction and without gangrene (Primary) Patient is getting episodes of Symptoms of Hernia  - Ambulatory referral to General Surgery  2. Senile osteoporosis Labs before Prolia from our Office  3. Peripheral polyneuropathy Doing well with Gabapentin Low dose  4. Malignant neoplasm of upper-outer quadrant of left breast in female,  estrogen receptor negative (HCC) Follows with Oncology  5. Mixed hyperlipidemia On statin and Research  6. Acquired hypothyroidism   7. NSTEMI (non-ST elevated myocardial infarction) (HCC) Statin and Plavix     Labs/tests ordered:  * No order type specified * Next appt:  09/07/2023  Total time spent in this patient care encounter was  45_  minutes; greater than 50% of the visit spent counseling patient and staff, reviewing records , Labs and coordinating care for problems addressed at this encounter.

## 2023-06-09 ENCOUNTER — Encounter: Payer: Self-pay | Admitting: *Deleted

## 2023-06-12 ENCOUNTER — Inpatient Hospital Stay: Attending: Hematology and Oncology | Admitting: Adult Health

## 2023-06-12 ENCOUNTER — Other Ambulatory Visit: Payer: Self-pay | Admitting: *Deleted

## 2023-06-12 ENCOUNTER — Other Ambulatory Visit (HOSPITAL_COMMUNITY): Payer: Self-pay

## 2023-06-12 ENCOUNTER — Encounter: Payer: Self-pay | Admitting: Adult Health

## 2023-06-12 ENCOUNTER — Inpatient Hospital Stay: Attending: Hematology and Oncology

## 2023-06-12 VITALS — BP 124/66 | HR 67 | Temp 97.4°F | Resp 17 | Ht 63.0 in | Wt 154.6 lb

## 2023-06-12 DIAGNOSIS — C50412 Malignant neoplasm of upper-outer quadrant of left female breast: Secondary | ICD-10-CM | POA: Diagnosis not present

## 2023-06-12 DIAGNOSIS — Z171 Estrogen receptor negative status [ER-]: Secondary | ICD-10-CM | POA: Diagnosis not present

## 2023-06-12 NOTE — Progress Notes (Unsigned)
 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 Indeterminate 1.2 cm right breast mass    09/30/2022 Initial Biopsy   Right breast biopsy demonstrates intraductal papilloma and no evidence of atypia or malignancy.   10/18/2022 Surgery   Left mastectomy scheduled with Dr. Luisa Hart   10/18/2022 Surgery   Left mastectomy: Grade 2 invasive lobular cancer 2.7 cm with DCIS, margins negative, suspicious for LVI, ER 0%, PR 0%, HER2 negative, Ki-67 5%, 0/7 lymph nodes Right mastectomy: Benign   11/23/2022 -  Chemotherapy   Patient is on Treatment Plan : BREAST Adjuvant CMF IV q21d     04/19/2023 - 05/11/2023 Radiation Therapy   Plan Name: CW_L_BH_BO Site: Chest Wall, Left Technique: 3D Mode: Photon Dose Per Fraction: 2.67 Gy Prescribed Dose (Delivered / Prescribed): 21.36 Gy / 21.36 Gy Prescribed Fxs (Delivered / Prescribed): 8 / 8   Plan Name: CW_L_BH Site: Chest Wall, Left Technique: 3D Mode: Photon Dose Per Fraction: 2.67 Gy Prescribed Dose (Delivered / Prescribed): 18.69 Gy / 18.69 Gy Prescribed Fxs (Delivered / Prescribed): 7 / 7     INTERVAL HISTORY:  Ms. Friddle to review her survivorship care plan detailing her treatment course for breast  cancer, as well as monitoring long-term side effects of that treatment, education regarding health maintenance, screening, and overall wellness and health promotion.     Overall, Ms. Kyler reports feeling quite well.    REVIEW OF SYSTEMS:  Review of Systems  Constitutional:  Negative for appetite change, chills, fatigue, fever and unexpected weight change.  HENT:   Negative for hearing loss, lump/mass and trouble swallowing.   Eyes:  Negative for eye problems and icterus.  Respiratory:  Negative for chest tightness, cough and shortness of breath.   Cardiovascular:  Negative for chest pain, leg swelling and palpitations.  Gastrointestinal:  Negative for abdominal distention, abdominal pain, constipation, diarrhea, nausea and vomiting.  Endocrine: Negative for hot flashes.  Genitourinary:  Negative for difficulty urinating.   Musculoskeletal:  Negative for arthralgias.  Skin:  Negative for itching and rash.  Neurological:  Negative for dizziness, extremity weakness, headaches and numbness.  Hematological:  Negative for adenopathy. Does not bruise/bleed easily.  Psychiatric/Behavioral:  Negative for depression. The patient is not nervous/anxious.    Breast: Denies any new nodularity, masses, tenderness, nipple changes, or nipple discharge.       PAST MEDICAL/SURGICAL HISTORY:  Past Medical History:  Diagnosis Date   Anemia 12/2015   Bronchitis, chronic (HCC)    in her 62s   Cataract    Chronic kidney disease    in 89s had nephritis   Closed fracture of right distal humerus    right   Coronary artery disease    Hand, foot and mouth disease (HFMD)    Hearing loss  05/03/2016   High cholesterol    Hypothyroidism    Myocardial infarction (HCC)    Osteopenia    Pericarditis 01/1999   Pneumothorax 01/1999   Thyroid disorder    Vitamin B 12 deficiency    Past Surgical History:  Procedure Laterality Date   birthmark      removed,left leg   BREAST BIOPSY Left 09/13/2022   [place]  LT BREAST BX W LOC DEV 1ST LESION IMG BX SPEC US GUIDE 09/13/2022 [facility]   CARDIAC CATHETERIZATION     CATARACT EXTRACTION  2017   CORONARY STENT INTERVENTION N/A 09/01/2020   Procedure: CORONARY STENT INTERVENTION;  Surgeon: Lyn Records, MD;  Location: [facility];  Service: Cardiovascular;  Laterality: N/A;   LEFT HEART CATH AND CORONARY ANGIOGRAPHY N/A 09/01/2020   Procedure: LEFT HEART CATH AND CORONARY ANGIOGRAPHY;  Surgeon: Lyn Records, MD;  Location: [facility];  Service: Cardiovascular;  Laterality: N/A;   ORIF HUMERUS FRACTURE Right 04/10/2019   Procedure: RIGHT OPEN REDUCTION INTERNAL FIXATION (ORIF) DISTAL HUMERUS FRACTURE WITH EXTENSION;  Surgeon: Bjorn Pippin, MD;  Location: [facility];  Service: Orthopedics;  Laterality: Right;   PORTACATH PLACEMENT N/A 11/15/2022   Procedure: PORT PLACEMENT WITH ULTRASOUND GUIDANCE;  Surgeon: Harriette Bouillon, MD;  Location: [facility];  Service: General;  Laterality: N/A;   SIMPLE MASTECTOMY WITH AXILLARY SENTINEL NODE BIOPSY Bilateral 10/18/2022   Procedure: BILATERAL SIMPLE MASTECTOMY;  Surgeon: Harriette Bouillon, MD;  Location: [facility];  Service: General;  Laterality: Bilateral;  PEC BLOCK   TONSILLECTOMY AND ADENOIDECTOMY  1951?     ALLERGIES:  No Known Allergies   CURRENT MEDICATIONS:  Outpatient Encounter Medications as of 06/12/2023  Medication Sig   acetaminophen (TYLENOL) 650 MG CR tablet Take 650 mg by mouth as needed for pain.   Calcium Carb-Cholecalciferol (CALCIUM 600+D) 600-800 MG-UNIT TABS Take 3 tablets by mouth daily.   clobetasol ointment (TEMOVATE) 0.05 % Apply 1 Application topically 2 (two) times daily.   clopidogrel (PLAVIX) 75 MG tablet TAKE 1 TABLET BY MOUTH DAILY   denosumab (PROLIA) 60 MG/ML SOSY injection Inject 60 mg into the skin every 6 (six) months. Administered at [facility] q62m   ferrous sulfate 325 (65 FE) MG tablet Take 325 mg by mouth daily  with breakfast.   gabapentin (NEURONTIN) 100 MG capsule Take 1 capsule (100 mg total) by mouth daily.   levothyroxine (SYNTHROID) 112 MCG tablet Take 1 tablet (112 mcg total) by mouth daily.   loratadine (CLARITIN) 10 MG tablet Take 10 mg by mouth as needed for allergies.   melatonin 5 MG TABS Take 5 mg by mouth at bedtime.   nitroGLYCERIN (NITROSTAT) 0.4 MG SL tablet DISSOLVE 1 TAB UNDER TONGUE FOR CHEST PAIN - IF PAIN REMAINS AFTER 5 MIN, CALL 911 AND REPEAT DOSE. MAX 3 TABS IN 15 MINUTES   rosuvastatin (CRESTOR) 20 MG tablet TAKE 1 TABLET BY MOUTH DAILY   Study - [name] - olpasiran (AMG 890) 142 mg/mL or placebo SQ injection (PI-Hilty) Inject 142 mg into the skin once. For investigational use only - Inject 1 mL (1 prefilled syringe) subcutaneously into appropriate injection site per protocol. (Approved injection site(s): upper arm, upper thigh, abdomen). Please contact [facility] if any questions.   VITAMIN D PO Take 1 capsule by mouth daily.   [DISCONTINUED] calcium carbonate (OS-CAL) 1250 (500 Ca) MG chewable tablet Chew 1 tablet (1,250 mg total) by mouth daily.  Facility-Administered Encounter Medications as of 06/12/2023  Medication   [START ON 11/13/2023] denosumab (PROLIA) injection 60 mg     ONCOLOGIC FAMILY HISTORY:  Family History  Problem Relation Age of Onset   Diabetes Mother    Asthma Mother    Ataxia Father    Stroke Father    Cancer - Cervical Sister    Thyroid disease Sister    Diabetes Maternal Grandmother    Arthritis Paternal Grandmother    Diabetes Other    Colon cancer Neg Hx    Breast cancer Neg Hx      SOCIAL HISTORY:  Social History   Socioeconomic History   Marital status: Divorced    Spouse name: Not on file   Number of children: 1   Years of education: college   Highest education level: Not on file  Occupational History   Occupation: retired  Tobacco Use   Smoking status: Never   Smokeless tobacco: Never  Vaping Use    Vaping status: Never Used  Substance and Sexual Activity   Alcohol use: Yes    Alcohol/week: 3.0 standard drinks of alcohol    Types: 3 Glasses of wine per week    Comment: Occasional   Drug use: No   Sexual activity: Not on file  Other Topics Concern   Not on file  Social History Narrative   Moved to [facility] 01/14/2015   Do you drink/eat things with caffeine?  yes   Marital status?      divorced                               Do you live in a house, apartment, assisted living, condo, trailer, etc.?  Retirement community   Is it one or more stories? Yes, 3   How many persons live in your home?  1   Do you have any pets in your home? (please list) 0   Current or past profession:  Librarian   Do you exercise?     yes                                 Type & how often? Walk-varies   Do you have a living will? yes   Do you have a DNR form?         no                         If not, do you want to discuss one? yes   Do you have signed POA/HPOA for forms? yes   Social Drivers of Corporate investment banker Strain: Low Risk  (07/11/2022)   Overall Financial Resource Strain (CARDIA)    Difficulty of Paying Living Expenses: Not hard at all  Food Insecurity: No Food Insecurity (04/07/2023)   Hunger Vital Sign    Worried About Running Out of Food in the Last Year: Never true    Ran Out of Food in the Last Year: Never true  Transportation Needs: No Transportation Needs (04/07/2023)   PRAPARE - Administrator, Civil Service (Medical): No    Lack of Transportation (Non-Medical): No  Physical Activity: Insufficiently Active (07/11/2022)   Exercise Vital Sign    Days of Exercise per Week: 3 days    Minutes of Exercise per Session: 30 min  Stress: No  Stress Concern Present (07/11/2022)   [employer] of Occupational Health - Occupational Stress Questionnaire    Feeling of Stress : Only a little  Social Connections: Moderately Integrated (06/07/2023)   Social Connection and  Isolation Panel [NHANES]    Frequency of Communication with Friends and Family: More than three times a week    Frequency of Social Gatherings with Friends and Family: More than three times a week    Attends Religious Services: More than 4 times per year    Active Member of [employer] or Organizations: Yes    Attends Banker Meetings: 1 to 4 times per year    Marital Status: Widowed  Intimate Partner Violence: Not At Risk (04/07/2023)   Humiliation, Afraid, Rape, and Kick questionnaire    Fear of Current or Ex-Partner: No    Emotionally Abused: No    Physically Abused: No    Sexually Abused: No     OBSERVATIONS/OBJECTIVE:  BP 124/66 (BP Location: Right Arm, Patient Position: Sitting)   Pulse 67   Temp (!) 97.4 F (36.3 C) (Tympanic)   Resp 17   Ht 5\' 3"  (1.6 m)   Wt 154 lb 9.6 oz (70.1 kg)   SpO2 98%   BMI 27.39 kg/m  GENERAL: Patient is a well appearing female in no acute distress HEENT:  Sclerae anicteric.  Oropharynx clear and moist. No ulcerations or evidence of oropharyngeal candidiasis. Neck is supple.  NODES:  No cervical, supraclavicular, or axillary lymphadenopathy palpated.  BREAST EXAM:  s/p bilateral mastectomies, no sign of local recurrence. LUNGS:  Clear to auscultation bilaterally.  No wheezes or rhonchi. HEART:  Regular rate and rhythm. No murmur appreciated. ABDOMEN:  Soft, nontender.  Positive, normoactive bowel sounds. No organomegaly palpated. MSK:  No focal spinal tenderness to palpation. Full range of motion bilaterally in the upper extremities. EXTREMITIES:  No peripheral edema.   SKIN:  Clear with no obvious rashes or skin changes. No nail dyscrasia. NEURO:  Nonfocal. Well oriented.  Appropriate affect.   LABORATORY DATA:  None for this visit.  DIAGNOSTIC IMAGING:  None for this visit.      ASSESSMENT AND PLAN:  Ms.. Barrell is a pleasant 83 y.o. 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  She presents to the Survivorship Clinic for our initial meeting and routine follow-up post-completion of treatment for breast cancer.    1. Stage IIB left breast cancer:  Ms. Randle is continuing to recover from definitive treatment for breast cancer. She will follow-up with her medical oncologist, Dr.  Pamelia Hoit in 6 months with history and physical exam per surveillance protocol.  Today, a comprehensive survivorship care plan and treatment summary was reviewed with the patient today detailing her breast cancer diagnosis, treatment course, potential late/long-term effects of treatment, appropriate follow-up care with recommendations for the future, and patient education resources.  A copy of this summary, along with a letter will be sent to the patient's primary care provider via mail/fax/In Basket message after today's visit.    2. Bone health:    She was given education on specific activities to promote bone health.  3. Cancer screening:  Due to Ms. Dau's history and her age, she should receive screening for skin cancers, colon cancer, and gynecologic cancers.  The information and recommendations are listed on the patient's comprehensive care plan/treatment summary and were reviewed in detail with the patient.  4. Health maintenance and wellness promotion: Ms. Piech was encouraged to consume 5-7 servings of fruits and vegetables per day. We reviewed the "Nutrition Rainbow" handout.  She was also encouraged to engage in moderate to vigorous exercise for 30 minutes per day most days of the week.  She was instructed to limit her alcohol consumption and continue to abstain from tobacco use.     5. Support services/counseling: It is not uncommon for this period of the patient's cancer care trajectory to be one of many emotions and stressors.   She was given information regarding our available  services and encouraged to contact me with any questions or for help enrolling in any of our support group/programs.    Follow up instructions:    -Return to cancer center in 6months for f/u with Dr. Al Pimple  - She is welcome to return back to the Survivorship Clinic at any time; no additional follow-up needed at this time.  -Consider referral back to survivorship as a long-term survivor for continued surveillance  The patient was provided an opportunity to ask questions and all were answered. The patient agreed with the plan and demonstrated an understanding of the instructions.   Total encounter time:40 minutes*in face-to-face visit time, chart review, lab review, care coordination, order entry, and documentation of the encounter time.    Lillard Anes, NP 06/12/23 2:05 PM Medical Oncology and Hematology [facility] [address] [place], [place] [address] Tel. [phone]    Fax. [phone]  *Total Encounter Time as defined by the Centers for Medicare and Medicaid Services includes, in addition to the face-to-face time of a patient visit (documented in the note above) non-face-to-face time: obtaining and reviewing outside history, ordering and reviewing medications, tests or procedures, care coordination (communications with other health care professionals or caregivers) and documentation in the medical record.

## 2023-06-13 ENCOUNTER — Telehealth: Payer: Self-pay | Admitting: Hematology and Oncology

## 2023-06-13 NOTE — Telephone Encounter (Signed)
 Spoke with pt about scheduled appt date and time.

## 2023-06-15 ENCOUNTER — Ambulatory Visit
Admission: RE | Admit: 2023-06-15 | Discharge: 2023-06-15 | Disposition: A | Discharge status: Home / Self Care | Source: Ambulatory Visit | Attending: Radiation Oncology | Admitting: Radiation Oncology

## 2023-06-15 ENCOUNTER — Encounter: Payer: Self-pay | Admitting: Hematology and Oncology

## 2023-06-15 DIAGNOSIS — C50412 Malignant neoplasm of upper-outer quadrant of left female breast: Secondary | ICD-10-CM

## 2023-06-19 DIAGNOSIS — H26492 Other secondary cataract, left eye: Secondary | ICD-10-CM | POA: Diagnosis not present

## 2023-06-22 ENCOUNTER — Encounter: Payer: Self-pay | Admitting: Adult Health

## 2023-06-27 ENCOUNTER — Other Ambulatory Visit: Payer: Self-pay | Admitting: Internal Medicine

## 2023-06-29 LAB — GUARDANT REVEAL

## 2023-07-12 ENCOUNTER — Telehealth (HOSPITAL_BASED_OUTPATIENT_CLINIC_OR_DEPARTMENT_OTHER): Payer: Self-pay | Admitting: *Deleted

## 2023-07-12 DIAGNOSIS — K429 Umbilical hernia without obstruction or gangrene: Secondary | ICD-10-CM | POA: Diagnosis not present

## 2023-07-12 NOTE — Telephone Encounter (Signed)
   Pre-operative Risk Assessment    Patient Name: Jessica [name]  DOB: [birth year] MRN: [id number]   Date of last office visit: 02/06/2023 Date of next office visit: 08/17/2023  Request for Surgical Clearance    Procedure:  Umbilical Surgery  Date of Surgery:  Clearance TBD                                 Surgeon:  Dr. Teddie Favre Surgeon's Group or Practice Name:  [facility] Phone number:  [phone] Fax number:  [phone]   Type of Clearance Requested:   - Medical  - Pharmacy:  Hold Clopidogrel  (Plavix ) Not indicated   Type of Anesthesia:  General    Additional requests/questions:    Signed, Lauris Port   07/12/2023, 4:40 PM

## 2023-07-13 NOTE — Telephone Encounter (Signed)
   Name: Jessica [name]  DOB: 08/14/1940  MRN: [id number]  Primary Cardiologist: Arnoldo Lapping, MD   Preoperative team, please contact this patient and set up a phone call appointment for further preoperative risk assessment. Please obtain consent and complete medication review. Thank you for your help.  I confirm that guidance regarding antiplatelet and oral anticoagulation therapy has been completed and, if necessary, noted below.  Per office protocol, he may hold Plavix  for 5 days prior to procedure and should resume as soon as hemodynamically stable postoperatively. Patient should take aspirin  81 mg daily if not contraindicated from a bleeding standpoint. She will stop aspirin  with resumption of Plavix .   I also confirmed the patient resides in the state of [place] . As per [facility] Medical Board telemedicine laws, the patient must reside in the state in which the provider is licensed.   Morey Ar, NP 07/13/2023, 4:11 PM [facility]

## 2023-07-14 NOTE — Telephone Encounter (Signed)
 Tried to call the pt to set up tele per preop APP. Pt is scheduled to see Marcie Sever, [facility] 08/14/23

## 2023-07-14 NOTE — Telephone Encounter (Signed)
 Correction appt with Marcie Sever, PAC is 08/17/23.

## 2023-07-17 ENCOUNTER — Telehealth: Payer: Self-pay

## 2023-07-17 NOTE — Telephone Encounter (Signed)
 Pt called office and stated that even through she has an in office appt she would like to go ahead and schedule the TELE Preop appt to complete preop clearance.  Pt is scheduled for TELE Preop appt 07/19/23.  med rec and consent done

## 2023-07-17 NOTE — Telephone Encounter (Signed)
 I called the pt to schedule a tele preop appt, but we got disconnected. I called back and left message to call back to set up tele appt for preop. We can also ask the pt if she wants to wait until; her appt with Marcie Sever, [facility] 08/17/23 for her 6 month f/u and discuss the preop clearance at that time.   Clearance request states TBD.

## 2023-07-17 NOTE — Telephone Encounter (Signed)
 med rec and consent done     Patient Consent for Virtual Visit        Jessica [name] has provided verbal consent on 07/17/2023 for a virtual visit (video or telephone).   CONSENT FOR VIRTUAL VISIT FOR:  Jessica [name]  By participating in this virtual visit I agree to the following:  I hereby voluntarily request, consent and authorize [facility] and its employed or contracted physicians, physician assistants, nurse practitioners or other licensed health care professionals (the Practitioner), to provide me with telemedicine health care services (the "Services") as deemed necessary by the treating Practitioner. I acknowledge and consent to receive the Services by the Practitioner via telemedicine. I understand that the telemedicine visit will involve communicating with the Practitioner through live audiovisual communication technology and the disclosure of certain medical information by electronic transmission. I acknowledge that I have been given the opportunity to request an in-person assessment or other available alternative prior to the telemedicine visit and am voluntarily participating in the telemedicine visit.  I understand that I have the right to withhold or withdraw my consent to the use of telemedicine in the course of my care at any time, without affecting my right to future care or treatment, and that the Practitioner or I may terminate the telemedicine visit at any time. I understand that I have the right to inspect all information obtained and/or recorded in the course of the telemedicine visit and may receive copies of available information for a reasonable fee.  I understand that some of the potential risks of receiving the Services via telemedicine include:  Delay or interruption in medical evaluation due to technological equipment failure or disruption; Information transmitted may not be sufficient (e.g. poor resolution of images) to allow for appropriate medical  decision making by the Practitioner; and/or  In rare instances, security protocols could fail, causing a breach of personal health information.  Furthermore, I acknowledge that it is my responsibility to provide information about my medical history, conditions and care that is complete and accurate to the best of my ability. I acknowledge that Practitioner's advice, recommendations, and/or decision may be based on factors not within their control, such as incomplete or inaccurate data provided by me or distortions of diagnostic images or specimens that may result from electronic transmissions. I understand that the practice of medicine is not an exact science and that Practitioner makes no warranties or guarantees regarding treatment outcomes. I acknowledge that a copy of this consent can be made available to me via my patient portal [facility] MyChart), or I can request a printed copy by calling the office of  [facility].    I understand that my insurance will be billed for this visit.   I have read or had this consent read to me. I understand the contents of this consent, which adequately explains the benefits and risks of the Services being provided via telemedicine.  I have been provided ample opportunity to ask questions regarding this consent and the Services and have had my questions answered to my satisfaction. I give my informed consent for the services to be provided through the use of telemedicine in my medical care

## 2023-07-19 ENCOUNTER — Ambulatory Visit: Attending: Cardiology | Admitting: Emergency Medicine

## 2023-07-19 DIAGNOSIS — Z01810 Encounter for preprocedural cardiovascular examination: Secondary | ICD-10-CM | POA: Diagnosis not present

## 2023-07-19 NOTE — Progress Notes (Signed)
 Virtual Visit via Telephone Note   Because of [name] co-morbid illnesses, she is at least at moderate risk for complications without adequate follow up.  This format is felt to be most appropriate for this patient at this time.  Due to technical limitations with video connection (technology), today's appointment will be conducted as an audio only telehealth visit, and Jessica [name] verbally agreed to proceed in this manner.   All issues noted in this document were discussed and addressed.  No physical exam could be performed with this format.  Evaluation Performed:  Preoperative cardiovascular risk assessment _____________   Date:  07/19/2023   Patient ID:  Jessica [name], DOB Apr 15, 1940, MRN [id number] Patient Location:  Home Provider location:   Office  Primary Care Provider:  Marguerite Shiley, MD Primary Cardiologist:  Arnoldo Lapping, MD  Chief Complaint / Patient Profile   83 y.o. y/o female with a h/o coronary artery disease, remote history of pericarditis, hyperlipidemia, hypothyroidism who is pending umbilical surgery on date TBD by [place] surgery by Dr. Marny Sires and presents today for telephonic preoperative cardiovascular risk assessment.  History of Present Illness    Jessica [name] is a 83 y.o. female who presents via audio/video conferencing for a telehealth visit today.  Pt was last seen in cardiology clinic on 02/06/2023 by Dr. Arlester Ladd.  At that time Jessica [name] was doing well.  The patient is now pending procedure as outlined above. Since her last visit, she denies chest pain, shortness of breath, lower extremity edema, fatigue, palpitations, melena, hematuria, hemoptysis, diaphoresis, weakness, presyncope, syncope, orthopnea, and PND.  Today patient notes that she is doing well.  She is without any acute cardiovascular concerns or complaints.  She is fairly active and walks daily.  She is without any exertional symptoms during her  activities.  She can complete greater than 4 METS.  Past Medical History    Past Medical History:  Diagnosis Date   Anemia 12/2015   Bronchitis, chronic (HCC)    in her 55s   Cataract    Chronic kidney disease    in 41s had nephritis   Closed fracture of right distal humerus    right   Coronary artery disease    Hand, foot and mouth disease (HFMD)    Hearing loss 05/03/2016   High cholesterol    Hypothyroidism    Myocardial infarction (HCC)    Osteopenia    Pericarditis 01/1999   Pneumothorax 01/1999   Thyroid  disorder    Vitamin B 12 deficiency    Past Surgical History:  Procedure Laterality Date   birthmark      removed,left leg   BREAST BIOPSY Left 09/13/2022   US  LT BREAST BX W LOC DEV 1ST LESION IMG BX SPEC US  GUIDE 09/13/2022 [facility] MAMMOGRAPHY   CARDIAC CATHETERIZATION     CATARACT EXTRACTION  2017   CORONARY STENT INTERVENTION N/A 09/01/2020   Procedure: CORONARY STENT INTERVENTION;  Surgeon: Arty Binning, MD;  [facility];  Service: Cardiovascular;  Laterality: N/A;   LEFT HEART CATH AND CORONARY ANGIOGRAPHY N/A 09/01/2020   Procedure: LEFT HEART CATH AND CORONARY ANGIOGRAPHY;  Surgeon: Arty Binning, MD;  [facility];  Service: Cardiovascular;  Laterality: N/A;   ORIF HUMERUS FRACTURE Right 04/10/2019   Procedure: RIGHT OPEN REDUCTION INTERNAL FIXATION (ORIF) DISTAL HUMERUS FRACTURE WITH EXTENSION;  Surgeon: Micheline Ahr, MD;  Location: [facility];  Service: Orthopedics;  Laterality: Right;   PORTACATH PLACEMENT N/A 11/15/2022   Procedure: PORT PLACEMENT WITH ULTRASOUND GUIDANCE;  Surgeon: Sim Dryer, MD;  [facility]: [facility];  Service: General;  Laterality: N/A;   SIMPLE MASTECTOMY WITH AXILLARY SENTINEL NODE BIOPSY Bilateral 10/18/2022   Procedure: BILATERAL SIMPLE MASTECTOMY;  Surgeon: Sim Dryer, MD;  Location: [facility];  Service: General;  Laterality: Bilateral;  PEC BLOCK   TONSILLECTOMY  AND ADENOIDECTOMY  1952?    Allergies  No Known Allergies  Home Medications    Prior to Admission medications   Medication Sig Start Date End Date Taking? Authorizing Provider  acetaminophen  (TYLENOL ) 650 MG CR tablet Take 650 mg by mouth as needed for pain.    [provider]  Calcium  Carb-Cholecalciferol  (CALCIUM  600+D) 600-800 MG-UNIT TABS Take 3 tablets by mouth daily.    [provider]  clobetasol ointment (TEMOVATE) 0.05 % Apply 1 Application topically 2 (two) times daily. 12/20/22   [provider]  clopidogrel  (PLAVIX ) 75 MG tablet TAKE 1 TABLET BY MOUTH DAILY 02/06/23   Arnoldo Lapping, MD  denosumab  (PROLIA ) 60 MG/ML SOSY injection Inject 60 mg into the skin every 6 (six) months. Administered at [facility] q49m    [provider]  ferrous sulfate 325 (65 FE) MG tablet Take 325 mg by mouth daily with breakfast.    [provider]  gabapentin  (NEURONTIN ) 100 MG capsule Take 1 capsule (100 mg total) by mouth daily. 05/19/23   Marguerite Shiley, MD  levothyroxine  (SYNTHROID ) 112 MCG tablet TAKE 1 TABLET BY MOUTH DAILY 06/27/23   Marguerite Shiley, MD  loratadine  (CLARITIN ) 10 MG tablet Take 10 mg by mouth as needed for allergies.    [provider]  melatonin 5 MG TABS Take 5 mg by mouth at bedtime. 09/11/22   [provider]  nitroGLYCERIN  (NITROSTAT ) 0.4 MG SL tablet DISSOLVE 1 TAB UNDER TONGUE FOR CHEST PAIN - IF PAIN REMAINS AFTER 5 MIN, CALL 911 AND REPEAT DOSE. MAX 3 TABS IN 15 MINUTES 01/27/23   Arnoldo Lapping, MD  rosuvastatin  (CRESTOR ) 20 MG tablet TAKE 1 TABLET BY MOUTH DAILY 01/02/23   Arnoldo Lapping, MD  Study - [name] - olpasiran (AMG 890) 142 mg/mL or placebo SQ injection (PI-Hilty) Inject 142 mg into the skin once. For investigational use only - Inject 1 mL (1 prefilled syringe) subcutaneously into appropriate injection site per protocol. (Approved injection site(s): upper arm, upper thigh, abdomen).  Please contact [facility] if any questions.    Hazle Lites, MD  VITAMIN D  PO Take 1 capsule by mouth daily.    [provider]  calcium  carbonate (OS-CAL) 1250 (500 Ca) MG chewable tablet Chew 1 tablet (1,250 mg total) by mouth daily. 09/11/17 09/01/20  Burnette Carte, MD    Physical Exam    Vital Signs:  Jessica [name] does not have vital signs available for review today.  Given telephonic nature of communication, physical exam is limited. AAOx3. NAD. Normal affect.  Speech and respirations are unlabored.  Accessory Clinical Findings    None  Assessment & Plan    1.  Preoperative Cardiovascular Risk Assessment: According to the Revised Cardiac Risk Index (RCRI), her Perioperative Risk of Major Cardiac Event is (%): 0.4; Her Functional Capacity in METs is: 5.07 according to the Duke Activity Status Index (DASI). Therefore, based on ACC/AHA guidelines, patient would be at acceptable risk for the planned procedure without further cardiovascular testing.   The patient was advised  that if she develops new symptoms prior to surgery to contact our office to arrange for a follow-up visit, and she verbalized understanding.  Per office protocol, he may hold Plavix  for 5 days prior to procedure and should resume as soon as hemodynamically stable postoperatively. Patient should take aspirin  81 mg daily if not contraindicated from a bleeding standpoint. She will stop aspirin  with resumption of Plavix .   A copy of this note will be routed to requesting surgeon.  Time:   Today, I have spent 7 minutes with the patient with telehealth technology discussing medical history, symptoms, and management plan.     Ava Boatman, NP  07/19/2023, 2:50 PM

## 2023-08-14 ENCOUNTER — Encounter: Payer: Self-pay | Admitting: *Deleted

## 2023-08-14 ENCOUNTER — Ambulatory Visit: Attending: Surgery

## 2023-08-14 VITALS — Wt 155.4 lb

## 2023-08-14 DIAGNOSIS — R2689 Other abnormalities of gait and mobility: Secondary | ICD-10-CM

## 2023-08-14 DIAGNOSIS — Z483 Aftercare following surgery for neoplasm: Secondary | ICD-10-CM | POA: Insufficient documentation

## 2023-08-14 NOTE — Therapy (Signed)
 OUTPATIENT PHYSICAL THERAPY SOZO SCREENING NOTE   Patient Name: Jessica [name] MRN: [id number] DOB:1941-01-13, 83 y.o., female Today's Date: 08/14/2023  PCP: Marguerite Shiley, MD REFERRING PROVIDER: Sim Dryer, MD   PT End of Session - 08/14/23 1043     Visit Number 16   # unchanged due to screen only   PT Start Time 1041    PT Stop Time 1045    PT Time Calculation (min) 4 min    Activity Tolerance Patient tolerated treatment well    Behavior During Therapy [facility] for tasks assessed/performed             Past Medical History:  Diagnosis Date   Anemia 12/2015   Bronchitis, chronic (HCC)    in her 36s   Cataract    Chronic kidney disease    in 20s had nephritis   Closed fracture of right distal humerus    right   Coronary artery disease    Hand, foot and mouth disease (HFMD)    Hearing loss 05/03/2016   High cholesterol    Hypothyroidism    Myocardial infarction (HCC)    Osteopenia    Pericarditis 01/1999   Pneumothorax 01/1999   Thyroid  disorder    Vitamin B 12 deficiency    Past Surgical History:  Procedure Laterality Date   birthmark      removed,left leg   BREAST BIOPSY Left 09/13/2022   US  LT BREAST BX W LOC DEV 1ST LESION IMG BX SPEC US  GUIDE 09/13/2022 [facility] MAMMOGRAPHY   CARDIAC CATHETERIZATION     CATARACT EXTRACTION  2017   CORONARY STENT INTERVENTION N/A 09/01/2020   Procedure: CORONARY STENT INTERVENTION;  Surgeon: Arty Binning, MD;  [facility];  Service: Cardiovascular;  Laterality: N/A;   LEFT HEART CATH AND CORONARY ANGIOGRAPHY N/A 09/01/2020   Procedure: LEFT HEART CATH AND CORONARY ANGIOGRAPHY;  Surgeon: Arty Binning, MD;  [facility];  Service: Cardiovascular;  Laterality: N/A;   ORIF HUMERUS FRACTURE Right 04/10/2019   Procedure: RIGHT OPEN REDUCTION INTERNAL FIXATION (ORIF) DISTAL HUMERUS FRACTURE WITH EXTENSION;  Surgeon: Micheline Ahr, MD;  Location: [facility];  Service: Orthopedics;   Laterality: Right;   PORTACATH PLACEMENT N/A 11/15/2022   Procedure: PORT PLACEMENT WITH ULTRASOUND GUIDANCE;  Surgeon: Sim Dryer, MD;  [facility];  Service: General;  Laterality: N/A;   SIMPLE MASTECTOMY WITH AXILLARY SENTINEL NODE BIOPSY Bilateral 10/18/2022   Procedure: BILATERAL SIMPLE MASTECTOMY;  Surgeon: Sim Dryer, MD;  Location: [facility];  Service: General;  Laterality: Bilateral;  PEC BLOCK   TONSILLECTOMY AND ADENOIDECTOMY  1952?   Patient Active Problem List   Diagnosis Date Noted   Malignant neoplasm of upper-outer quadrant of left breast in female, estrogen receptor negative (HCC) 09/21/2022   Atherosclerotic heart disease of native coronary artery with other forms of angina pectoris (HCC) 04/08/2022   Swelling of left foot 04/07/2022   Left hand fracture, sequela 04/07/2022   Depression with anxiety 04/07/2022   NSTEMI (non-ST elevated myocardial infarction) (HCC) 09/01/2020   Closed fracture of right distal humerus 04/10/2019   Dyspnea 05/09/2018   Vitamin B 12 deficiency 05/09/2018   Cough 03/06/2018   Senile osteoporosis 11/20/2017   Iron deficiency 04/26/2017   Lichen sclerosus 04/26/2017   External hemorrhoid 04/26/2017   Weakness of left leg 06/07/2016   Balance disorder 05/24/2016   Memory deficit 05/24/2016   Anemia 05/19/2016   Hearing loss 05/03/2016  Hypothyroidism 07/24/2013   Bradycardia on ECG 07/24/2013   Numbness 08/07/2012   HYPERLIPIDEMIA 12/29/2006    REFERRING DIAG: left breast cancer at risk for lymphedema  THERAPY DIAG: Aftercare following surgery for neoplasm  PERTINENT HISTORY: Patient was diagnosed on 09/13/2022 with 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 She underwent a bilateral mastectomy and left sentinel node biopsy (7 negative nodes) on 10/18/2022. It is triple negative with a Ki67 of 5%. She has some cardiac history and had an elbow fracture in 2022 resulting in surgery where pins were  placed.   PRECAUTIONS: left UE Lymphedema risk, None  SUBJECTIVE: Pt returns for her 3 month L-Dex screen.  "I think I want to come back for PT. I stopped doing some of my exercises and my balance feels worse."  PAIN:  Are you having pain? No  SOZO SCREENING: Patient was assessed today using the SOZO machine to determine the lymphedema index score. This was compared to her baseline score. It was determined that she is within the recommended range when compared to her baseline and no further action is needed at this time. She will continue SOZO screenings. These are done every 3 months for 2 years post operatively followed by every 6 months for 2 years, and then annually.  Patient reported a change in status to PTA which initiated the PTA consulting with a PT. PT determined it would be appropriate to initiate therapy at this time. PT requested a referral from patient's provider.    L-DEX FLOWSHEETS - 08/14/23 1000       L-DEX LYMPHEDEMA SCREENING   Measurement Type Unilateral    L-DEX MEASUREMENT EXTREMITY Upper Extremity    POSITION  Standing    DOMINANT SIDE Right    At Risk Side Left    BASELINE SCORE (UNILATERAL) -2.2    L-DEX SCORE (UNILATERAL) 3.9    VALUE CHANGE (UNILAT) 6.1            P: Cont every 3 month L-Dex screens until 2 years from surgery, then transition to 6 months x 2 yrs. Eval for balance and postural deficits. Possibly redo SOZO x 1 month due to having near subclinical levels.    Denyce Flank, PTA 08/14/2023, 10:44 AM

## 2023-08-14 NOTE — Progress Notes (Unsigned)
 Cardiology Office Note:    Date:  08/14/2023   ID:  Jessica [name], DOB [birth year], MRN [id number]  PCP:  Marguerite Shiley, MD   [facility] Providers Cardiologist:  Arnoldo Lapping, MD { Click to update primary MD,subspecialty MD or APP then REFRESH:1}    Referring MD: Marguerite Shiley, MD   No chief complaint on file. ***  History of Present Illness:    Jessica [name] is a 83 y.o. female with a hx of breast cancer, CAD, HLD, pericarditis (2000), and hypothyroidism.  She had an NSTEMI 2022 with subtotal occlusion of the LAD treated with DES.  LVEF 40-45% improved to 50-55% on follow-up echo with reperfusion on GDMT.  Echocardiogram 2023 with an LVEF 55-60% and no RWMA. Last echo 08/2022 with preserved LVEF 60-65%, normal RV size and function, and no significant valvular disease.  She presents for cardiac risk evaluation prior to umbilical surgery ([facility] fax [phone]) with plavix  hold.     CAD DES-LAD - on plavix  and crestor    Hyperlipidemia with LDL goal < 70 - who follows this - on crestor    Preoperative risk evaluation for MAC       Past Medical History:  Diagnosis Date   Anemia 12/2015   Bronchitis, chronic (HCC)    in her 92s   Cataract    Chronic kidney disease    in 20s had nephritis   Closed fracture of right distal humerus    right   Coronary artery disease    Hand, foot and mouth disease (HFMD)    Hearing loss 05/03/2016   High cholesterol    Hypothyroidism    Myocardial infarction (HCC)    Osteopenia    Pericarditis 01/1999   Pneumothorax 01/1999   Thyroid  disorder    Vitamin B 12 deficiency     Past Surgical History:  Procedure Laterality Date   birthmark      removed,left leg   BREAST BIOPSY Left 09/13/2022   US  LT BREAST BX W LOC DEV 1ST LESION IMG BX SPEC US  GUIDE 09/13/2022 [facility] MAMMOGRAPHY   CARDIAC CATHETERIZATION     CATARACT EXTRACTION  2017   CORONARY STENT INTERVENTION N/A 09/01/2020   Procedure: CORONARY STENT  INTERVENTION;  Surgeon: Arty Binning, MD;  [facility];  Service: Cardiovascular;  Laterality: N/A;   LEFT HEART CATH AND CORONARY ANGIOGRAPHY N/A 09/01/2020   Procedure: LEFT HEART CATH AND CORONARY ANGIOGRAPHY;  Surgeon: Arty Binning, MD;  [facility];  Service: Cardiovascular;  Laterality: N/A;   ORIF HUMERUS FRACTURE Right 04/10/2019   Procedure: RIGHT OPEN REDUCTION INTERNAL FIXATION (ORIF) DISTAL HUMERUS FRACTURE WITH EXTENSION;  Surgeon: Micheline Ahr, MD;  Location: [facility];  Service: Orthopedics;  Laterality: Right;   PORTACATH PLACEMENT N/A 11/15/2022   Procedure: PORT PLACEMENT WITH ULTRASOUND GUIDANCE;  Surgeon: Sim Dryer, MD;  [facility];  Service: General;  Laterality: N/A;   SIMPLE MASTECTOMY WITH AXILLARY SENTINEL NODE BIOPSY Bilateral 10/18/2022   Procedure: BILATERAL SIMPLE MASTECTOMY;  Surgeon: Sim Dryer, MD;  [facility];  Service: General;  Laterality: Bilateral;  PEC BLOCK   TONSILLECTOMY AND ADENOIDECTOMY  1952?    Current Medications: No outpatient medications have been marked as taking for the 08/17/23 encounter (Appointment) with Lamond Pilot, PA.   Current Facility-Administered Medications for the 08/17/23 encounter (Appointment) with Lamond Pilot, PA  Medication   [START ON 11/13/2023] denosumab  (PROLIA ) injection 60  mg     Allergies:   Patient has no known allergies.   Social History   Socioeconomic History   Marital status: Divorced    Spouse name: Not on file   Number of children: 1   Years of education: college   Highest education level: Not on file  Occupational History   Occupation: retired  Tobacco Use   Smoking status: Never   Smokeless tobacco: Never  Vaping Use   Vaping status: Never Used  Substance and Sexual Activity   Alcohol use: Yes    Alcohol/week: 3.0 standard drinks of alcohol    Types: 3 Glasses of wine per week    Comment:  Occasional   Drug use: No   Sexual activity: Not on file  Other Topics Concern   Not on file  Social History Narrative   Moved to [facility] 01/14/2015   Do you drink/eat things with caffeine?  yes   Marital status?      divorced                               Do you live in a house, apartment, assisted living, condo, trailer, etc.?  Retirement community   Is it one or more stories? Yes, 3   How many persons live in your home?  1   Do you have any pets in your home? (please list) 0   Current or past profession:  Librarian   Do you exercise?     yes                                 Type & how often? Walk-varies   Do you have a living will? yes   Do you have a DNR form?         no                         If not, do you want to discuss one? yes   Do you have signed POA/HPOA for forms? yes   Social Drivers of Corporate investment banker Strain: Low Risk  (07/11/2022)   Overall Financial Resource Strain (CARDIA)    Difficulty of Paying Living Expenses: Not hard at all  Food Insecurity: No Food Insecurity (04/07/2023)   Hunger Vital Sign    Worried About Running Out of Food in the Last Year: Never true    Ran Out of Food in the Last Year: Never true  Transportation Needs: No Transportation Needs (04/07/2023)   PRAPARE - Administrator, Civil Service (Medical): No    Lack of Transportation (Non-Medical): No  Physical Activity: Insufficiently Active (07/11/2022)   Exercise Vital Sign    Days of Exercise per Week: 3 days    Minutes of Exercise per Session: 30 min  Stress: No Stress Concern Present (07/11/2022)   [employer] of Occupational Health - Occupational Stress Questionnaire    Feeling of Stress : Only a little  Social Connections: Moderately Integrated (06/07/2023)   Social Connection and Isolation Panel [NHANES]    Frequency of Communication with Friends and Family: More than three times a week    Frequency of Social Gatherings with Friends and Family: More than  three times a week    Attends Religious Services: More than 4 times per year    Active Member  of Clubs or Organizations: Yes    Attends Banker Meetings: 1 to 4 times per year    Marital Status: Widowed     Family History: The patient's ***family history includes Arthritis in her paternal grandmother; Asthma in her mother; Ataxia in her father; Cancer - Cervical in her sister; Diabetes in her maternal grandmother, mother, and another family member; Stroke in her father; Thyroid  disease in her sister. There is no history of Colon cancer or Breast cancer.  ROS:   Please see the history of present illness.    *** All other systems reviewed and are negative.  EKGs/Labs/Other Studies Reviewed:    The following studies were reviewed today: ***      Recent Labs: 03/15/2023: ALT 16; BUN 12; Creatinine 0.56; Hemoglobin 12.5; Platelet Count 213; Potassium 4.3; Sodium 139  Recent Lipid Panel    Component Value Date/Time   CHOL 150 05/30/2022 0811   CHOL 149 03/16/2021 0920   TRIG 90 05/30/2022 0811   HDL 58 05/30/2022 0811   HDL 56 03/16/2021 0920   CHOLHDL 2.6 05/30/2022 0811   VLDL 18 02/24/2014 0926   LDLCALC 75 05/30/2022 0811   LDLDIRECT 135.4 12/26/2006 0847     Risk Assessment/Calculations:   {Does this patient have ATRIAL FIBRILLATION?:[phone]}  No BP recorded.  {Refresh Note OR Click here to enter BP  :1}***         Physical Exam:    VS:  There were no vitals taken for this visit.    Wt Readings from Last 3 Encounters:  08/14/23 155 lb 6 oz (70.5 kg)  06/12/23 154 lb 9.6 oz (70.1 kg)  06/07/23 154 lb (69.9 kg)     GEN: *** Well nourished, well developed in no acute distress HEENT: Normal NECK: No JVD; No carotid bruits LYMPHATICS: No lymphadenopathy CARDIAC: ***RRR, no murmurs, rubs, gallops RESPIRATORY:  Clear to auscultation without rales, wheezing or rhonchi  ABDOMEN: Soft, non-tender, non-distended MUSCULOSKELETAL:  No edema; No deformity   SKIN: Warm and dry NEUROLOGIC:  Alert and oriented x 3 PSYCHIATRIC:  Normal affect   ASSESSMENT:    No diagnosis found. PLAN:    In order of problems listed above:  ***      {Are you ordering a CV Procedure (e.g. stress test, cath, DCCV, TEE, etc)?   Press F2        :[id number]}    Medication Adjustments/Labs and Tests Ordered: Current medicines are reviewed at length with the patient today.  Concerns regarding medicines are outlined above.  No orders of the defined types were placed in this encounter.  No orders of the defined types were placed in this encounter.   There are no Patient Instructions on file for this visit.   Signed, Warren Haber Lyra Alaimo, PA  08/14/2023 3:03 PM     [facility]

## 2023-08-16 ENCOUNTER — Encounter (HOSPITAL_COMMUNITY)

## 2023-08-17 ENCOUNTER — Ambulatory Visit: Attending: Physician Assistant | Admitting: Physician Assistant

## 2023-08-17 ENCOUNTER — Encounter: Payer: Self-pay | Admitting: Physician Assistant

## 2023-08-17 VITALS — BP 114/66 | HR 68 | Ht 63.0 in | Wt 156.0 lb

## 2023-08-17 DIAGNOSIS — Z01810 Encounter for preprocedural cardiovascular examination: Secondary | ICD-10-CM | POA: Insufficient documentation

## 2023-08-17 DIAGNOSIS — E785 Hyperlipidemia, unspecified: Secondary | ICD-10-CM | POA: Insufficient documentation

## 2023-08-17 DIAGNOSIS — E782 Mixed hyperlipidemia: Secondary | ICD-10-CM | POA: Diagnosis not present

## 2023-08-17 DIAGNOSIS — I2510 Atherosclerotic heart disease of native coronary artery without angina pectoris: Secondary | ICD-10-CM | POA: Insufficient documentation

## 2023-08-17 DIAGNOSIS — R293 Abnormal posture: Secondary | ICD-10-CM | POA: Diagnosis not present

## 2023-08-17 DIAGNOSIS — H53123 Transient visual loss, bilateral: Secondary | ICD-10-CM | POA: Insufficient documentation

## 2023-08-17 DIAGNOSIS — R2689 Other abnormalities of gait and mobility: Secondary | ICD-10-CM | POA: Diagnosis not present

## 2023-08-17 DIAGNOSIS — I255 Ischemic cardiomyopathy: Secondary | ICD-10-CM | POA: Diagnosis not present

## 2023-08-17 NOTE — Patient Instructions (Signed)
 Medication Instructions:  Your physician recommends that you continue on your current medications as directed. Please refer to the Current Medication list given to you today.  *If you need a refill on your cardiac medications before your next appointment, please call your pharmacy*  Lab Work: NONE ordered at this time of appointment   Testing/Procedures: NONE ordered at this time of appointment   Follow-Up: At [facility], you and your health needs are our priority.  As part of our continuing mission to provide you with exceptional heart care, our providers are all part of one team.  This team includes your primary Cardiologist (physician) and Advanced Practice Providers or APPs (Physician Assistants and Nurse Practitioners) who all work together to provide you with the care you need, when you need it.  Your next appointment:   7-8 month(s)  Provider:   Arnoldo Lapping, MD or Marcie Sever, PA-C          We recommend signing up for the patient portal called MyChart.  Sign up information is provided on this After Visit Summary.  MyChart is used to connect with patients for Virtual Visits (Telemedicine).  Patients are able to view lab/test results, encounter notes, upcoming appointments, etc.  Non-urgent messages can be sent to your provider as well.   To learn more about what you can do with MyChart, go to [email or web address].

## 2023-08-19 ENCOUNTER — Other Ambulatory Visit: Payer: Self-pay | Admitting: Cardiovascular Disease

## 2023-08-21 ENCOUNTER — Encounter: Payer: Self-pay | Admitting: Internal Medicine

## 2023-08-23 ENCOUNTER — Encounter: Payer: Self-pay | Admitting: Cardiovascular Disease

## 2023-08-23 ENCOUNTER — Other Ambulatory Visit: Payer: Self-pay | Admitting: Internal Medicine

## 2023-08-23 NOTE — Telephone Encounter (Signed)
 Patient has request refill on medication Gabapentin  100mg . Patient medication was refilled with 90 tablets and zero refills. Medication pend and sent to PCP Marguerite Shiley, MD for additional refilled/approval.

## 2023-08-28 ENCOUNTER — Ambulatory Visit: Payer: Self-pay | Admitting: Surgery

## 2023-08-29 ENCOUNTER — Ambulatory Visit: Attending: Hematology and Oncology | Admitting: Physical Therapy

## 2023-08-29 DIAGNOSIS — C50412 Malignant neoplasm of upper-outer quadrant of left female breast: Secondary | ICD-10-CM | POA: Diagnosis not present

## 2023-08-29 DIAGNOSIS — R262 Difficulty in walking, not elsewhere classified: Secondary | ICD-10-CM | POA: Diagnosis not present

## 2023-08-29 DIAGNOSIS — R2689 Other abnormalities of gait and mobility: Secondary | ICD-10-CM | POA: Insufficient documentation

## 2023-08-29 DIAGNOSIS — M6281 Muscle weakness (generalized): Secondary | ICD-10-CM | POA: Diagnosis not present

## 2023-08-29 DIAGNOSIS — Z171 Estrogen receptor negative status [ER-]: Secondary | ICD-10-CM | POA: Insufficient documentation

## 2023-08-29 DIAGNOSIS — R293 Abnormal posture: Secondary | ICD-10-CM | POA: Insufficient documentation

## 2023-08-29 NOTE — Therapy (Signed)
 OUTPATIENT PHYSICAL THERAPY ONCOLOGY EVALUATION  Patient Name: Jessica [name] MRN: [id number] DOB:1940-07-03, 83 y.o., female Today's Date: 08/29/2023  END OF SESSION:  PT End of Session - 08/29/23 1554     Visit Number 1    Number of Visits 9    Date for PT Re-Evaluation 09/26/23    PT Start Time 1503    PT Stop Time 1546    PT Time Calculation (min) 43 min    Activity Tolerance Patient tolerated treatment well    Behavior During Therapy [facility] for tasks assessed/performed          Past Medical History:  Diagnosis Date   Anemia 12/2015   Bronchitis, chronic (HCC)    in her 94s   Cataract    Chronic kidney disease    in 20s had nephritis   Closed fracture of right distal humerus    right   Coronary artery disease    Hand, foot and mouth disease (HFMD)    Hearing loss 05/03/2016   High cholesterol    Hypothyroidism    Myocardial infarction (HCC)    Osteopenia    Pericarditis 01/1999   Pneumothorax 01/1999   Thyroid  disorder    Vitamin B 12 deficiency    Past Surgical History:  Procedure Laterality Date   birthmark      removed,left leg   BREAST BIOPSY Left 09/13/2022   US  LT BREAST BX W LOC DEV 1ST LESION IMG BX SPEC US  GUIDE 09/13/2022 [facility] MAMMO[facility]AP[facility]   CARDIAC CATHETERIZATION     CATARACT EXTRACTION  2017   CORONARY STENT INTERVENTION N/A 09/01/2020   Procedure: CORONARY STENT INTERVENTION;  Surgeon: Claudene Victory ORN, MD;  [facility];  Service: Cardiovascular;  Laterality: N/A;   LEFT HEART CATH AND CORONARY ANGIOGRAPHY N/A 09/01/2020   Procedure: LEFT HEART CATH AND CORONARY ANGIOGRAPHY;  Surgeon: Claudene Victory ORN, MD;  [facility]: [facility];  Service: Cardiovascular;  Laterality: N/A;   ORIF HUMERUS FRACTURE Right 04/10/2019   Procedure: RIGHT OPEN REDUCTION INTERNAL FIXATION (ORIF) DISTAL HUMERUS FRACTURE WITH EXTENSION;  Surgeon: Cristy Bonner DASEN, MD;  Location: [facility];  Service: Orthopedics;  Laterality: Right;    PORTACATH PLACEMENT N/A 11/15/2022   Procedure: PORT PLACEMENT WITH ULTRASOUND GUIDANCE;  Surgeon: Vanderbilt Ned, MD;  Location: [facility];  Service: General;  Laterality: N/A;   SIMPLE MASTECTOMY WITH AXILLARY SENTINEL NODE BIOPSY Bilateral 10/18/2022   Procedure: BILATERAL SIMPLE MASTECTOMY;  Surgeon: Vanderbilt Ned, MD;  Location: [facility];  Service: General;  Laterality: Bilateral;  PEC BLOCK   TONSILLECTOMY AND ADENOIDECTOMY  1952?   Patient Active Problem List   Diagnosis Date Noted   Malignant neoplasm of upper-outer quadrant of left breast in female, estrogen receptor negative (HCC) 09/21/2022   Atherosclerotic heart disease of native coronary artery with other forms of angina pectoris (HCC) 04/08/2022   Swelling of left foot 04/07/2022   Left hand fracture, sequela 04/07/2022   Depression with anxiety 04/07/2022   NSTEMI (non-ST elevated myocardial infarction) (HCC) 09/01/2020   Closed fracture of right distal humerus 04/10/2019   Dyspnea 05/09/2018   Vitamin B 12 deficiency 05/09/2018   Cough 03/06/2018   Senile osteoporosis 11/20/2017   Iron deficiency 04/26/2017   Lichen sclerosus 04/26/2017   External hemorrhoid 04/26/2017   Weakness of left leg 06/07/2016   Balance disorder 05/24/2016   Memory deficit 05/24/2016   Anemia 05/19/2016   Hearing loss 05/03/2016   Hypothyroidism 07/24/2013  Bradycardia on ECG 07/24/2013   Numbness 08/07/2012   HYPERLIPIDEMIA 12/29/2006    PCP: Fredia Bring, MD  REFERRING PROVIDER: Odean Potts, MD  REFERRING DIAG: R26.89 (ICD-10-CM) - Balance problem  THERAPY DIAG:  Muscle weakness (generalized)  Difficulty in walking, not elsewhere classified  Abnormal posture  Malignant neoplasm of upper-outer quadrant of left breast in female, estrogen receptor negative (HCC)  ONSET DATE: 10/18/22  Rationale for Evaluation and Treatment: Rehabilitation  SUBJECTIVE:                                                                                                                                                                                            SUBJECTIVE STATEMENT: My balance has gotten bad and I have noticed by posture has gotten terrible. I stopped going to the gym. I stopped walking. I don't know why but I just did.   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 She underwent a bilateral mastectomy and left sentinel node biopsy (7 negative nodes) on 10/18/2022. It is triple negative with a Ki67 of 5%. She has some cardiac history and had an elbow fracture in 2022 resulting in surgery where pins were placed.   PAIN:  Are you having pain? No no pain but I do feel tightness across my chest  PRECAUTIONS: Other: L UE lymphedema risk  WEIGHT BEARING RESTRICTIONS: No  FALLS:  Has patient fallen in last 6 months? No  LIVING ENVIRONMENT: Lives with: lives alone but at [facility] Lives in: House/apartment Stairs: No Has following equipment at home: Single point cane  OCCUPATION: retired  LEISURE: goes to water exercise 1x/wk, tries to do some walking  HAND DOMINANCE: right   PRIOR LEVEL OF FUNCTION: Independent  PATIENT GOALS: to become more stable, to have more strength   OBJECTIVE: Note: Objective measures were completed at Evaluation unless otherwise noted.  COGNITION: Overall cognitive status: Within functional limits for tasks assessed   POSTURE: forward head, rounded shoulders  LOWER EXTREMITY STRENGTH:   MMT Right Eval on 12/05/22 RIGHT  08/29/23 on EVAL  Hip flexion 5/5 4/5  Hip extension 4/5 4+/5  Hip abduction 4/5 5/5  Hip adduction     Hip internal rotation     Hip external rotation     Knee flexion 5/5 5/5  Knee extension 5/5 5/5  Ankle dorsiflexion 5/5 5/5  Ankle plantarflexion     Ankle inversion     Ankle eversion      (Blank rows = not tested)   A/PROM LEFT Eval on 12/05/22 LEFT 08/29/23 EVAL   Hip flexion 5/5 4/5  Hip extension 3/5 4/5  Hip abduction 4+/5 5/5  Hip adduction     Hip internal rotation     Hip external rotation     Knee flexion 5/5 5/5  Knee extension 5/5 5/5  Ankle dorsiflexion 5/5 5/5  Ankle plantarflexion     Ankle inversion     Ankle eversion      (Blank rows = not tested)   FUNCTIONAL TEST:             08/29/23: 30 SEC SIT TO STAND: 11 reps which is good for her age 03/31/22: 30 SEC SIT TO STAND: 13 reps which is below average for her age 48/24/25: BERG balance test: 48/56, anything less than 45 is a greater risk of falling             01/05/23: BERG Balance Test: 51/56, anything less than 45 is a greater risk of falling   GAIT: Distance walked: 20 feet Assistive device utilized: None Level of assistance: Modified independence Comments: decreased arm swing bilaterally, occasional weaving                                                                                                                            TREATMENT DATE:   08/29/23- none today due to time constraints   PATIENT EDUCATION:  Education details: goals for therapy, how she is currently vs at d/c last episode Person educated: Patient Education method: Explanation Education comprehension: verbalized understanding  HOME EXERCISE PROGRAM: To be determined  ASSESSMENT:  CLINICAL IMPRESSION: Patient is a 83 y.o. female who was seen today for physical therapy evaluation and treatment for balance deficits and weakness. Pt was seen at this facility last year for balance and weakness following radiation for treatment of breast cancer. She reports after she was discharged from skilled PT services she stopped going to the gym and stopped walking. She reports she has had some visual disturbances and has an appointment to follow up with a doctor regarding this. She reports feeling off balance and being fearful of falling especially while going down stairs or curbs. Her BERG balance was lower  than it was at discharge last episode. She had difficulty with single limb stance. She has weakness in bilateral hip flexors and extensors. She has increased tightness across her chest following bilateral mastectomy and SLNB. She would benefit from skilled PT services to improve bilateral hip strength, improve balance with narrow base of support and SLS, decrease chest tightness and progress pt towards independence with a  home exercise program.    OBJECTIVE IMPAIRMENTS: difficulty walking, decreased strength, increased fascial restrictions, impaired flexibility, and postural dysfunction.   ACTIVITY LIMITATIONS: squatting, stairs, bed mobility, and locomotion level  PARTICIPATION LIMITATIONS: shopping and community activity  PERSONAL FACTORS: Age, Fitness, and Time since onset of injury/illness/exacerbation are also affecting patient's functional outcome, visual disturbances.   REHAB POTENTIAL: Good  CLINICAL DECISION MAKING: Evolving/moderate complexity  EVALUATION COMPLEXITY: Moderate  GOALS: Goals reviewed with  patient? Yes  SHORT TERM GOALS: Target date: 09/12/23  Pt will be independent with initial HEP for stretching and strengthening.  Baseline: Goal status: INITIAL  2.  Pt will be able to complete 12 sit to stands in 30 sec without use of UEs to decrease fall risk.  Baseline:  Goal status: INITIAL  LONG TERM GOALS: Target date: 09/26/23  Pt will be able to complete 13 sit to stands in 30 sec without use of UEs to decrease fall risk. Baseline:  Goal status: INITIAL  2.  Pt will demonstrate 5/5 bilateral hip flexor strength to decrease fall risk.  Baseline:  Goal status: INITIAL  3.  Pt will score at least 51 on the BERG balance to decrease fall risk.  Baseline:  Goal status: INITIAL  4.  Pt will report she is able to go down a flight a steps without fear of falling to decrease risk of falling.  Baseline:  Goal status: INITIAL  5.  Pt will be independent in a final  home exercise program for continued stretching and strengthening independently.  Baseline:  Goal status: INITIAL   PLAN:  PT FREQUENCY: 2x/week  PT DURATION: 4 weeks  PLANNED INTERVENTIONS: 97164- PT Re-evaluation, 97750- Physical Performance Testing, 97110-Therapeutic exercises, 97530- Therapeutic activity, [id number]- Neuromuscular re-education, 97535- Self Care, [id number]- Manual therapy, and 97116- Gait training  PLAN FOR NEXT SESSION: work on SLS and narrow base of support, hip strengthening, pec stretching   [employer], PT 08/29/2023, 4:06 PM

## 2023-08-30 ENCOUNTER — Encounter: Payer: Self-pay | Admitting: Neurology

## 2023-08-30 ENCOUNTER — Encounter: Payer: Self-pay | Admitting: Physical Therapy

## 2023-08-30 ENCOUNTER — Ambulatory Visit (INDEPENDENT_AMBULATORY_CARE_PROVIDER_SITE_OTHER): Admitting: Neurology

## 2023-08-30 ENCOUNTER — Ambulatory Visit: Admitting: Physical Therapy

## 2023-08-30 ENCOUNTER — Telehealth: Payer: Self-pay | Admitting: Neurology

## 2023-08-30 VITALS — BP 130/73 | HR 66 | Resp 17 | Ht 63.0 in | Wt 154.5 lb

## 2023-08-30 DIAGNOSIS — R202 Paresthesia of skin: Secondary | ICD-10-CM | POA: Insufficient documentation

## 2023-08-30 DIAGNOSIS — C50412 Malignant neoplasm of upper-outer quadrant of left female breast: Secondary | ICD-10-CM | POA: Diagnosis not present

## 2023-08-30 DIAGNOSIS — Z171 Estrogen receptor negative status [ER-]: Secondary | ICD-10-CM

## 2023-08-30 DIAGNOSIS — R293 Abnormal posture: Secondary | ICD-10-CM | POA: Diagnosis not present

## 2023-08-30 DIAGNOSIS — H538 Other visual disturbances: Secondary | ICD-10-CM | POA: Diagnosis not present

## 2023-08-30 DIAGNOSIS — M6281 Muscle weakness (generalized): Secondary | ICD-10-CM

## 2023-08-30 DIAGNOSIS — R262 Difficulty in walking, not elsewhere classified: Secondary | ICD-10-CM

## 2023-08-30 DIAGNOSIS — R2689 Other abnormalities of gait and mobility: Secondary | ICD-10-CM | POA: Diagnosis not present

## 2023-08-30 NOTE — Telephone Encounter (Signed)
 She is going to have elective surgery on July 16th 2025.  I ordered MRI brain, US  carotid, make it happen before that.

## 2023-08-30 NOTE — Therapy (Signed)
 OUTPATIENT PHYSICAL THERAPY ONCOLOGY TREATMENT  Patient Name: Jessica [name] MRN: [id number] DOB:11/26/1940, 83 y.o., female Today's Date: 08/30/2023  END OF SESSION:  PT End of Session - 08/30/23 0807     Visit Number 2    Number of Visits 9    Date for PT Re-Evaluation 09/26/23    PT Start Time 0803    PT Stop Time 0848    PT Time Calculation (min) 45 min    Activity Tolerance Patient tolerated treatment well    Behavior During Therapy [facility] for tasks assessed/performed          Past Medical History:  Diagnosis Date   Anemia 12/2015   Bronchitis, chronic (HCC)    in her 21s   Cataract    Chronic kidney disease    in 20s had nephritis   Closed fracture of right distal humerus    right   Coronary artery disease    Hand, foot and mouth disease (HFMD)    Hearing loss 05/03/2016   High cholesterol    Hypothyroidism    Myocardial infarction (HCC)    Osteopenia    Pericarditis 01/1999   Pneumothorax 01/1999   Thyroid  disorder    Vitamin B 12 deficiency    Past Surgical History:  Procedure Laterality Date   birthmark      removed,left leg   BREAST BIOPSY Left 09/13/2022   US  LT BREAST BX W LOC DEV 1ST LESION IMG BX SPEC US  GUIDE 09/13/2022 [facility] MAMMOGRAPHY   CARDIAC CATHETERIZATION     CATARACT EXTRACTION  2017   CORONARY STENT INTERVENTION N/A 09/01/2020   Procedure: CORONARY STENT INTERVENTION;  Surgeon: Claudene Victory ORN, MD;  [facility];  Service: Cardiovascular;  Laterality: N/A;   LEFT HEART CATH AND CORONARY ANGIOGRAPHY N/A 09/01/2020   Procedure: LEFT HEART CATH AND CORONARY ANGIOGRAPHY;  Surgeon: Claudene Victory ORN, MD;  [facility];  Service: Cardiovascular;  Laterality: N/A;   ORIF HUMERUS FRACTURE Right 04/10/2019   Procedure: RIGHT OPEN REDUCTION INTERNAL FIXATION (ORIF) DISTAL HUMERUS FRACTURE WITH EXTENSION;  Surgeon: Cristy Bonner DASEN, MD;  Location: [facility];  Service: Orthopedics;  Laterality: Right;    PORTACATH PLACEMENT N/A 11/15/2022   Procedure: PORT PLACEMENT WITH ULTRASOUND GUIDANCE;  Surgeon: Vanderbilt Ned, MD;  [facility];  Service: General;  Laterality: N/A;   SIMPLE MASTECTOMY WITH AXILLARY SENTINEL NODE BIOPSY Bilateral 10/18/2022   Procedure: BILATERAL SIMPLE MASTECTOMY;  Surgeon: Vanderbilt Ned, MD;  Location: [facility];  Service: General;  Laterality: Bilateral;  PEC BLOCK   TONSILLECTOMY AND ADENOIDECTOMY  1952?   Patient Active Problem List   Diagnosis Date Noted   Malignant neoplasm of upper-outer quadrant of left breast in female, estrogen receptor negative (HCC) 09/21/2022   Atherosclerotic heart disease of native coronary artery with other forms of angina pectoris (HCC) 04/08/2022   Swelling of left foot 04/07/2022   Left hand fracture, sequela 04/07/2022   Depression with anxiety 04/07/2022   NSTEMI (non-ST elevated myocardial infarction) (HCC) 09/01/2020   Closed fracture of right distal humerus 04/10/2019   Dyspnea 05/09/2018   Vitamin B 12 deficiency 05/09/2018   Cough 03/06/2018   Senile osteoporosis 11/20/2017   Iron deficiency 04/26/2017   Lichen sclerosus 04/26/2017   External hemorrhoid 04/26/2017   Weakness of left leg 06/07/2016   Balance disorder 05/24/2016   Memory deficit 05/24/2016   Anemia 05/19/2016   Hearing loss 05/03/2016   Hypothyroidism 07/24/2013  Bradycardia on ECG 07/24/2013   Numbness 08/07/2012   HYPERLIPIDEMIA 12/29/2006    PCP: Fredia Bring, MD  REFERRING PROVIDER: Odean Potts, MD  REFERRING DIAG: R26.89 (ICD-10-CM) - Balance problem  THERAPY DIAG:  Muscle weakness (generalized)  Difficulty in walking, not elsewhere classified  Abnormal posture  Malignant neoplasm of upper-outer quadrant of left breast in female, estrogen receptor negative (HCC)  ONSET DATE: 10/18/22  Rationale for Evaluation and Treatment: Rehabilitation  SUBJECTIVE:                                                                                                                                                                                            SUBJECTIVE STATEMENT: My balance is still a little wobbly morning since I just got up.   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 She underwent a bilateral mastectomy and left sentinel node biopsy (7 negative nodes) on 10/18/2022. It is triple negative with a Ki67 of 5%. She has some cardiac history and had an elbow fracture in 2022 resulting in surgery where pins were placed.   PAIN:  Are you having pain? No   PRECAUTIONS: Other: L UE lymphedema risk  WEIGHT BEARING RESTRICTIONS: No  FALLS:  Has patient fallen in last 6 months? No  LIVING ENVIRONMENT: Lives with: lives alone but at [facility] Lives in: House/apartment Stairs: No Has following equipment at home: Single point cane  OCCUPATION: retired  LEISURE: goes to water exercise 1x/wk, tries to do some walking  HAND DOMINANCE: right   PRIOR LEVEL OF FUNCTION: Independent  PATIENT GOALS: to become more stable, to have more strength   OBJECTIVE: Note: Objective measures were completed at Evaluation unless otherwise noted.  COGNITION: Overall cognitive status: Within functional limits for tasks assessed   POSTURE: forward head, rounded shoulders  LOWER EXTREMITY STRENGTH:   MMT Right Eval on 12/05/22 RIGHT  08/29/23 on EVAL  Hip flexion 5/5 4/5  Hip extension 4/5 4+/5  Hip abduction 4/5 5/5  Hip adduction     Hip internal rotation     Hip external rotation     Knee flexion 5/5 5/5  Knee extension 5/5 5/5  Ankle dorsiflexion 5/5 5/5  Ankle plantarflexion     Ankle inversion     Ankle eversion      (Blank rows = not tested)   A/PROM LEFT Eval on 12/05/22 LEFT 08/29/23 EVAL  Hip flexion 5/5 4/5  Hip extension 3/5 4/5  Hip abduction 4+/5 5/5  Hip adduction     Hip internal rotation     Hip external rotation  Knee flexion 5/5 5/5  Knee extension 5/5 5/5  Ankle dorsiflexion 5/5 5/5  Ankle plantarflexion     Ankle inversion     Ankle eversion      (Blank rows = not tested)   FUNCTIONAL TEST:             08/29/23: 30 SEC SIT TO STAND: 11 reps which is good for her age 908/25/24: 30 SEC SIT TO STAND: 13 reps which is below average for her age 90/24/25: BERG balance test: 48/56, anything less than 45 is a greater risk of falling             01/05/23: BERG Balance Test: 51/56, anything less than 45 is a greater risk of falling   GAIT: Distance walked: 20 feet Assistive device utilized: None Level of assistance: Modified independence Comments: decreased arm swing bilaterally, occasional weaving                                                                                                                            TREATMENT DATE:  08/30/23: NuStep seat at 8, level 3, steps 658 for 10:13 to warm up with v/c to avoid hip adduction while educating pt on awareness of posture and neck retraction exercise to help with posture Neuro Re Ed: In // bars:  Standing on blue oval air ex on R LE while doing 3 way hip on L LE to keep movement slow and controlled throughout with hand held assist to maintain balance x 10 reps in all directions then repeated on opposite side  Tandem walking on air ex x 6 reps  Step ups on 6 in step: step up/up then down/down then stepping up and over and then stepping backwards up and over all x 10 each Standing on air ex: mini squats x 10 with v/c to avoid hip adduction Practicing ascending/descending 1 flight of steps first with 1 HHA then with no HHA with v/c on form and importance of activation of muscles in feet for balance  08/29/23- none today due to time constraints   PATIENT EDUCATION:  Education details: goals for therapy, how she is currently vs at d/c last episode Person educated: Patient Education method: Explanation Education comprehension: verbalized  understanding  HOME EXERCISE PROGRAM: To be determined  ASSESSMENT:  CLINICAL IMPRESSION: Began strengthening and balance exercises today. Pt feels most challenged by her balance with doing activities that require single limb stance like stairs and curbs. Focused on therapeutic activities to help improve balance in single limb stance. Educated pt on importance of knee position when going from sit to stand.   OBJECTIVE IMPAIRMENTS: difficulty walking, decreased strength, increased fascial restrictions, impaired flexibility, and postural dysfunction.   ACTIVITY LIMITATIONS: squatting, stairs, bed mobility, and locomotion level  PARTICIPATION LIMITATIONS: shopping and community activity  PERSONAL FACTORS: Age, Fitness, and Time since onset of injury/illness/exacerbation are also affecting patient's functional outcome, visual disturbances.   REHAB POTENTIAL: Good  CLINICAL DECISION MAKING: Evolving/moderate  complexity  EVALUATION COMPLEXITY: Moderate  GOALS: Goals reviewed with patient? Yes  SHORT TERM GOALS: Target date: 09/12/23  Pt will be independent with initial HEP for stretching and strengthening.  Baseline: Goal status: INITIAL  2.  Pt will be able to complete 12 sit to stands in 30 sec without use of UEs to decrease fall risk.  Baseline:  Goal status: INITIAL  LONG TERM GOALS: Target date: 09/26/23  Pt will be able to complete 13 sit to stands in 30 sec without use of UEs to decrease fall risk. Baseline:  Goal status: INITIAL  2.  Pt will demonstrate 5/5 bilateral hip flexor strength to decrease fall risk.  Baseline:  Goal status: INITIAL  3.  Pt will score at least 51 on the BERG balance to decrease fall risk.  Baseline:  Goal status: INITIAL  4.  Pt will report she is able to go down a flight a steps without fear of falling to decrease risk of falling.  Baseline:  Goal status: INITIAL  5.  Pt will be independent in a final home exercise program for continued  stretching and strengthening independently.  Baseline:  Goal status: INITIAL   PLAN:  PT FREQUENCY: 2x/week  PT DURATION: 4 weeks  PLANNED INTERVENTIONS: 97164- PT Re-evaluation, 97750- Physical Performance Testing, 97110-Therapeutic exercises, 97530- Therapeutic activity, [id number]- Neuromuscular re-education, 97535- Self Care, 02859- Manual therapy, and 97116- Gait training  PLAN FOR NEXT SESSION: work on SLS and narrow base of support, hip strengthening, pec stretching   [employer], PT 08/30/2023, 8:58 AM

## 2023-08-30 NOTE — Progress Notes (Signed)
 Chief Complaint  Patient presents with   New Patient (Initial Visit)    Rm14, alone, Internal referral for possible TIA - blurry vision: pt stated that she has had 3 episodes of blurry vision. Pt also mentioned having bilateral foot numbness and pain       ASSESSMENT AND PLAN  Jessica [name] is a 83 y.o. female   Transient blurry vision  Multiple vascular risk factors, including aging, hypertension hyperlipidemia, history of coronary artery disease,  She has pending elective hernia repair surgery on September 20, 2023, discussed with patient, decided to proceed with MRI of the brain, ultrasound of carotid artery for vascular stratification  Differentiation diagnosis all of visual complaints also including dry eye   Keep Plavix  75 mg daily  DIAGNOSTIC DATA (LABS, IMAGING, TESTING) - I reviewed patient records, labs, notes, testing and imaging myself where available.   MEDICAL HISTORY:  Jessica [name], is a 83 year old female, seen in request by her primary care from [facility] Dr. Charlanne, Fredia for evaluation of visual change, initial evaluation was on August 30, 2023  History is obtained from the patient and review of electronic medical records. I personally reviewed pertinent available imaging films in PACS.   PMHx of  Hypothyrodism HLD CAD Right humerus fracture, required surgery Vit B12 deficiency Left breast cancer, s/p bilateral mastectomy in 2025,  She has dry eyes, in May 2025, she got a new pair of glasses just for her piano, when she first wear it, works very well for her  Shortly afterwards when she tried again, she noticed blurry vision bilaterally, with few blinks, her vision recovered,  She also described few episode since, looking people on the stage, everything was blurry, again with improvement with few eyeblink  Since 2024, she also has a rare occasional transient double vision  She denies droopy eyelid, no bulbar weakness, no muscle  weakness  She has pending umbilical hernia surgery on September 20, 2023, with her visual symptoms, she is concerned about the possibility of TIA, she do has multiple vascular risk factors including aging, hypertension, hyperlipidemia, coronary artery disease, on Plavix  75 mg daily  She also had a long history of bilateral feet paresthesia, mainly stay at toes, no significant gait abnormality, gabapentin  low-dose as needed every night was helpful   PHYSICAL EXAM:   Vitals:   08/30/23 1524  BP: 130/73  Pulse: 66  Resp: 17  Weight: 154 lb 8 oz (70.1 kg)  Height: 5' 3 (1.6 m)     Body mass index is 27.37 kg/m.  PHYSICAL EXAMNIATION:  Gen: NAD, conversant, well nourised, well groomed                     Cardiovascular: Regular rate rhythm, no peripheral edema, warm, nontender. Eyes: Conjunctivae clear without exudates or hemorrhage Neck: Supple, no carotid bruits. Pulmonary: Clear to auscultation bilaterally   NEUROLOGICAL EXAM:  MENTAL STATUS: Speech/cognition: Awake, alert, oriented to history taking and casual conversation CRANIAL NERVES: CN II: Visual fields are full to confrontation. Pupils are round equal and briskly reactive to light. CN III, IV, VI: extraocular movement are normal. No ptosis. CN V: Facial sensation is intact to light touch CN VII: Face is symmetric with normal eye closure  CN VIII: Hearing is normal to causal conversation. CN IX, X: Phonation is normal. CN XI: Head turning and shoulder shrug are intact  MOTOR: There is no pronator drift of out-stretched arms. Muscle bulk and tone are normal.  Muscle strength is normal.  REFLEXES: Reflexes are 1  and symmetric at the biceps, triceps, knees, and trace at ankles. Plantar responses are flexor.  SENSORY: Decreased vibratory sensation at toes, preserved light touch pinprick  COORDINATION: There is no trunk or limb dysmetria noted.  GAIT/STANCE: Posture is normal. Gait is steady    REVIEW OF SYSTEMS:   Full 14 system review of systems performed and notable only for as above All other review of systems were negative.   ALLERGIES: No Known Allergies  HOME MEDICATIONS: Current Outpatient Medications  Medication Sig Dispense Refill   acetaminophen  (TYLENOL ) 650 MG CR tablet Take 650 mg by mouth as needed for pain.     Calcium  Carb-Cholecalciferol  (CALCIUM  600+D) 600-800 MG-UNIT TABS Take 3 tablets by mouth daily.     clobetasol ointment (TEMOVATE) 0.05 % Apply 1 Application topically 2 (two) times daily.     clopidogrel  (PLAVIX ) 75 MG tablet TAKE 1 TABLET BY MOUTH DAILY 90 tablet 3   denosumab  (PROLIA ) 60 MG/ML SOSY injection Inject 60 mg into the skin every 6 (six) months. Administered at [facility] q16m     ferrous sulfate 325 (65 FE) MG tablet Take 325 mg by mouth daily with breakfast.     gabapentin  (NEURONTIN ) 100 MG capsule TAKE 1 CAPSULE BY MOUTH DAILY 90 capsule 1   levothyroxine  (SYNTHROID ) 112 MCG tablet TAKE 1 TABLET BY MOUTH DAILY 90 tablet 3   loratadine  (CLARITIN ) 10 MG tablet Take 10 mg by mouth as needed for allergies.     melatonin 5 MG TABS Take 5 mg by mouth at bedtime.     nitroGLYCERIN  (NITROSTAT ) 0.4 MG SL tablet DISSOLVE 1 TAB UNDER TONGUE FOR CHEST PAIN - IF PAIN REMAINS AFTER 5 MIN, CALL 911 AND REPEAT DOSE. MAX 3 TABS IN 15 MINUTES 25 tablet 6   rosuvastatin  (CRESTOR ) 20 MG tablet TAKE 1 TABLET BY MOUTH DAILY 90 tablet 3   Study - [name] - olpasiran (AMG 890) 142 mg/mL or placebo SQ injection (PI-Hilty) Inject 142 mg into the skin once. For investigational use only - Inject 1 mL (1 prefilled syringe) subcutaneously into appropriate injection site per protocol. (Approved injection site(s): upper arm, upper thigh, abdomen). Please contact [facility] if any questions.     VITAMIN D  PO Take 1 capsule by mouth daily.     Current Facility-Administered Medications  Medication Dose Route Frequency Provider Last Rate Last Admin   [START ON  11/13/2023] denosumab  (PROLIA ) injection 60 mg  60 mg Subcutaneous Q6 months Charlanne Fredia CROME, MD        PAST MEDICAL HISTORY: Past Medical History:  Diagnosis Date   Anemia 12/2015   Bronchitis, chronic (HCC)    in her 70s   Cataract    Chronic kidney disease    in 20s had nephritis   Closed fracture of right distal humerus    right   Coronary artery disease    Hand, foot and mouth disease (HFMD)    Hearing loss 05/03/2016   High cholesterol    Hypothyroidism    Myocardial infarction (HCC)    Osteopenia    Pericarditis 01/1999   Pneumothorax 01/1999   Thyroid  disorder    Vitamin B 12 deficiency     PAST SURGICAL HISTORY: Past Surgical History:  Procedure Laterality Date   birthmark      removed,left leg   BREAST BIOPSY Left 09/13/2022   US  LT BREAST BX W LOC DEV 1ST LESION IMG BX SPEC  US  GUIDE 09/13/2022 [facility] MAMMO[facility]APHY   CARDIAC CATHETERIZATION     CATARACT EXTRACTION  2017   CORONARY STENT INTERVENTION N/A 09/01/2020   Procedure: CORONARY STENT INTERVENTION;  Surgeon: Claudene Victory ORN, MD;  [facility];  Service: Cardiovascular;  Laterality: N/A;   LEFT HEART CATH AND CORONARY ANGIOGRAPHY N/A 09/01/2020   Procedure: LEFT HEART CATH AND CORONARY ANGIOGRAPHY;  Surgeon: Claudene Victory ORN, MD;  [facility];  Service: Cardiovascular;  Laterality: N/A;   ORIF HUMERUS FRACTURE Right 04/10/2019   Procedure: RIGHT OPEN REDUCTION INTERNAL FIXATION (ORIF) DISTAL HUMERUS FRACTURE WITH EXTENSION;  Surgeon: Cristy Bonner DASEN, MD;  Location: [facility];  Service: Orthopedics;  Laterality: Right;   PORTACATH PLACEMENT N/A 11/15/2022   Procedure: PORT PLACEMENT WITH ULTRASOUND GUIDANCE;  Surgeon: Vanderbilt Ned, MD;  Location: [facility];  Service: General;  Laterality: N/A;   SIMPLE MASTECTOMY WITH AXILLARY SENTINEL NODE BIOPSY Bilateral 10/18/2022   Procedure: BILATERAL SIMPLE MASTECTOMY;  Surgeon: Vanderbilt Ned, MD;  Location: [facility];  Service: General;  Laterality: Bilateral;  PEC BLOCK   TONSILLECTOMY AND ADENOIDECTOMY  1952?    FAMILY HISTORY: Family History  Problem Relation Age of Onset   Diabetes Mother    Asthma Mother    Ataxia Father    Stroke Father    Cancer - Cervical Sister    Thyroid  disease Sister    Diabetes Maternal Grandmother    Arthritis Paternal Grandmother    Diabetes Other    Colon cancer Neg Hx    Breast cancer Neg Hx     SOCIAL HISTORY: Social History   Socioeconomic History   Marital status: Divorced    Spouse name: Not on file   Number of children: 1   Years of education: college   Highest education level: Not on file  Occupational History   Occupation: retired  Tobacco Use   Smoking status: Never   Smokeless tobacco: Never  Vaping Use   Vaping status: Never Used  Substance and Sexual Activity   Alcohol use: Yes    Alcohol/week: 3.0 standard drinks of alcohol    Types: 3 Glasses of wine per week    Comment: Occasional   Drug use: No   Sexual activity: Not on file  Other Topics Concern   Not on file  Social History Narrative   Moved to [facility] 01/14/2015   Do you drink/eat things with caffeine?  yes   Marital status?      divorced                               Do you live in a house, apartment, assisted living, condo, trailer, etc.?  Retirement community   Is it one or more stories? Yes, 3   How many persons live in your home?  1   Do you have any pets in your home? (please list) 0   Current or past profession:  Librarian   Do you exercise?     yes                                 Type & how often? Walk-varies   Do you have a living will? yes   Do you have a DNR form?         no  If not, do you want to discuss one? yes   Do you have signed POA/HPOA for forms? yes   Social Drivers of Corporate investment banker Strain: Low Risk  (07/11/2022)   Overall Financial Resource Strain (CARDIA)    Difficulty of Paying Living  Expenses: Not hard at all  Food Insecurity: No Food Insecurity (04/07/2023)   Hunger Vital Sign    Worried About Running Out of Food in the Last Year: Never true    Ran Out of Food in the Last Year: Never true  Transportation Needs: No Transportation Needs (04/07/2023)   PRAPARE - Administrator, Civil Service (Medical): No    Lack of Transportation (Non-Medical): No  Physical Activity: Insufficiently Active (07/11/2022)   Exercise Vital Sign    Days of Exercise per Week: 3 days    Minutes of Exercise per Session: 30 min  Stress: No Stress Concern Present (07/11/2022)   [employer] of Occupational Health - Occupational Stress Questionnaire    Feeling of Stress : Only a little  Social Connections: Moderately Integrated (06/07/2023)   Social Connection and Isolation Panel    Frequency of Communication with Friends and Family: More than three times a week    Frequency of Social Gatherings with Friends and Family: More than three times a week    Attends Religious Services: More than 4 times per year    Active Member of [employer] or Organizations: Yes    Attends Banker Meetings: 1 to 4 times per year    Marital Status: Widowed  Intimate Partner Violence: Not At Risk (04/07/2023)   Humiliation, Afraid, Rape, and Kick questionnaire    Fear of Current or Ex-Partner: No    Emotionally Abused: No    Physically Abused: No    Sexually Abused: No      Modena Callander, M.D. Ph.D.  [facility] [address], [address] [place], [place] [address] Ph: [phone] Fax: [phone]  CC:  Madie Jon Garre, [place] [address] [place],  [place] [address]  Charlanne Fredia CROME, MD

## 2023-08-31 ENCOUNTER — Ambulatory Visit

## 2023-08-31 NOTE — Telephone Encounter (Signed)
 no auth required sent to [facility] [phone]

## 2023-09-05 ENCOUNTER — Encounter: Payer: Self-pay | Admitting: Physical Therapy

## 2023-09-05 ENCOUNTER — Ambulatory Visit: Attending: Hematology and Oncology | Admitting: Physical Therapy

## 2023-09-05 DIAGNOSIS — C50412 Malignant neoplasm of upper-outer quadrant of left female breast: Secondary | ICD-10-CM | POA: Diagnosis not present

## 2023-09-05 DIAGNOSIS — M25611 Stiffness of right shoulder, not elsewhere classified: Secondary | ICD-10-CM | POA: Insufficient documentation

## 2023-09-05 DIAGNOSIS — Z483 Aftercare following surgery for neoplasm: Secondary | ICD-10-CM | POA: Insufficient documentation

## 2023-09-05 DIAGNOSIS — M25612 Stiffness of left shoulder, not elsewhere classified: Secondary | ICD-10-CM | POA: Insufficient documentation

## 2023-09-05 DIAGNOSIS — M6281 Muscle weakness (generalized): Secondary | ICD-10-CM | POA: Insufficient documentation

## 2023-09-05 DIAGNOSIS — R293 Abnormal posture: Secondary | ICD-10-CM | POA: Diagnosis not present

## 2023-09-05 DIAGNOSIS — Z171 Estrogen receptor negative status [ER-]: Secondary | ICD-10-CM | POA: Diagnosis not present

## 2023-09-05 DIAGNOSIS — R262 Difficulty in walking, not elsewhere classified: Secondary | ICD-10-CM | POA: Insufficient documentation

## 2023-09-05 NOTE — Therapy (Signed)
 OUTPATIENT PHYSICAL THERAPY ONCOLOGY TREATMENT  Patient Name: Jessica [name] MRN: [id number] DOB:10/27/1940, 83 y.o., female Today's Date: 09/05/2023  END OF SESSION:  PT End of Session - 09/05/23 1600     Visit Number 3    Number of Visits 9    Date for PT Re-Evaluation 09/26/23    PT Start Time 1502    PT Stop Time 1550    PT Time Calculation (min) 48 min    Activity Tolerance Patient tolerated treatment well    Behavior During Therapy [facility] for tasks assessed/performed           Past Medical History:  Diagnosis Date   Anemia 12/2015   Bronchitis, chronic (HCC)    in her 19s   Cataract    Chronic kidney disease    in 20s had nephritis   Closed fracture of right distal humerus    right   Coronary artery disease    Hand, foot and mouth disease (HFMD)    Hearing loss 05/03/2016   High cholesterol    Hypothyroidism    Myocardial infarction (HCC)    Osteopenia    Pericarditis 01/1999   Pneumothorax 01/1999   Thyroid  disorder    Vitamin B 12 deficiency    Past Surgical History:  Procedure Laterality Date   birthmark      removed,left leg   BREAST BIOPSY Left 09/13/2022   US  LT BREAST BX W LOC DEV 1ST LESION IMG BX SPEC US  GUIDE 09/13/2022 [facility] MAMMOGRAPHY   CARDIAC CATHETERIZATION     CATARACT EXTRACTION  2017   CORONARY STENT INTERVENTION N/A 09/01/2020   Procedure: CORONARY STENT INTERVENTION;  Surgeon: Claudene Victory ORN, MD;  [facility];  Service: Cardiovascular;  Laterality: N/A;   LEFT HEART CATH AND CORONARY ANGIOGRAPHY N/A 09/01/2020   Procedure: LEFT HEART CATH AND CORONARY ANGIOGRAPHY;  Surgeon: Claudene Victory ORN, MD;  [facility];  Service: Cardiovascular;  Laterality: N/A;   ORIF HUMERUS FRACTURE Right 04/10/2019   Procedure: RIGHT OPEN REDUCTION INTERNAL FIXATION (ORIF) DISTAL HUMERUS FRACTURE WITH EXTENSION;  Surgeon: Cristy Bonner DASEN, MD;  Location: [facility];  Service: Orthopedics;  Laterality: Right;    PORTACATH PLACEMENT N/A 11/15/2022   Procedure: PORT PLACEMENT WITH ULTRASOUND GUIDANCE;  Surgeon: Vanderbilt Ned, MD;  [facility];  Service: General;  Laterality: N/A;   SIMPLE MASTECTOMY WITH AXILLARY SENTINEL NODE BIOPSY Bilateral 10/18/2022   Procedure: BILATERAL SIMPLE MASTECTOMY;  Surgeon: Vanderbilt Ned, MD;  Location: [facility];  Service: General;  Laterality: Bilateral;  PEC BLOCK   TONSILLECTOMY AND ADENOIDECTOMY  1952?   Patient Active Problem List   Diagnosis Date Noted   Blurry vision, bilateral 08/30/2023   Paresthesia 08/30/2023   Malignant neoplasm of upper-outer quadrant of left breast in female, estrogen receptor negative (HCC) 09/21/2022   Atherosclerotic heart disease of native coronary artery with other forms of angina pectoris (HCC) 04/08/2022   Swelling of left foot 04/07/2022   Left hand fracture, sequela 04/07/2022   Depression with anxiety 04/07/2022   NSTEMI (non-ST elevated myocardial infarction) (HCC) 09/01/2020   Closed fracture of right distal humerus 04/10/2019   Dyspnea 05/09/2018   Vitamin B 12 deficiency 05/09/2018   Cough 03/06/2018   Senile osteoporosis 11/20/2017   Iron deficiency 04/26/2017   Lichen sclerosus 04/26/2017   External hemorrhoid 04/26/2017   Weakness of left leg 06/07/2016   Balance disorder 05/24/2016   Memory deficit 05/24/2016   Anemia 05/19/2016  Hearing loss 05/03/2016   Hypothyroidism 07/24/2013   Bradycardia on ECG 07/24/2013   Numbness 08/07/2012   HYPERLIPIDEMIA 12/29/2006    PCP: Fredia Bring, MD  REFERRING PROVIDER: Odean Potts, MD  REFERRING DIAG: R26.89 (ICD-10-CM) - Balance problem  THERAPY DIAG:  Muscle weakness (generalized)  Difficulty in walking, not elsewhere classified  Abnormal posture  Malignant neoplasm of upper-outer quadrant of left breast in female, estrogen receptor negative (HCC)  Aftercare following surgery for neoplasm  Stiffness of left shoulder, not elsewhere  classified  Stiffness of right shoulder, not elsewhere classified  ONSET DATE: 10/18/22  Rationale for Evaluation and Treatment: Rehabilitation  SUBJECTIVE:                                                                                                                                                                                           SUBJECTIVE STATEMENT: My balance is still a little wobbly morning since I just got up.   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 She underwent a bilateral mastectomy and left sentinel node biopsy (7 negative nodes) on 10/18/2022. It is triple negative with a Ki67 of 5%. She has some cardiac history and had an elbow fracture in 2022 resulting in surgery where pins were placed.   PAIN:  Are you having pain? No   PRECAUTIONS: Other: L UE lymphedema risk  WEIGHT BEARING RESTRICTIONS: No  FALLS:  Has patient fallen in last 6 months? No  LIVING ENVIRONMENT: Lives with: lives alone but at [facility] Lives in: House/apartment Stairs: No Has following equipment at home: Single point cane  OCCUPATION: retired  LEISURE: goes to water exercise 1x/wk, tries to do some walking  HAND DOMINANCE: right   PRIOR LEVEL OF FUNCTION: Independent  PATIENT GOALS: to become more stable, to have more strength   OBJECTIVE: Note: Objective measures were completed at Evaluation unless otherwise noted.  COGNITION: Overall cognitive status: Within functional limits for tasks assessed   POSTURE: forward head, rounded shoulders  LOWER EXTREMITY STRENGTH:   MMT Right Eval on 12/05/22 RIGHT  08/29/23 on EVAL  Hip flexion 5/5 4/5  Hip extension 4/5 4+/5  Hip abduction 4/5 5/5  Hip adduction     Hip internal rotation     Hip external rotation     Knee flexion 5/5 5/5  Knee extension 5/5 5/5  Ankle dorsiflexion 5/5 5/5  Ankle plantarflexion     Ankle inversion     Ankle eversion       (Blank rows = not tested)   A/PROM LEFT Eval on 12/05/22 LEFT 08/29/23 EVAL  Hip flexion 5/5 4/5  Hip extension 3/5 4/5  Hip abduction 4+/5 5/5  Hip adduction     Hip internal rotation     Hip external rotation     Knee flexion 5/5 5/5  Knee extension 5/5 5/5  Ankle dorsiflexion 5/5 5/5  Ankle plantarflexion     Ankle inversion     Ankle eversion      (Blank rows = not tested)   FUNCTIONAL TEST:             08/29/23: 30 SEC SIT TO STAND: 11 reps which is good for her age 06/30/22: 30 SEC SIT TO STAND: 13 reps which is below average for her age 62/24/25: BERG balance test: 48/56, anything less than 45 is a greater risk of falling             01/05/23: BERG Balance Test: 51/56, anything less than 45 is a greater risk of falling   GAIT: Distance walked: 20 feet Assistive device utilized: None Level of assistance: Modified independence Comments: decreased arm swing bilaterally, occasional weaving                                                                                                                            TREATMENT DATE:  09/05/23:  Neuro Re Ed: In // bars:  Standing on blue oval air ex on R LE while doing 3 way hip using 2 lb ankle weight on L LE to keep movement slow and controlled throughout with hand held assist to maintain balance x 10 reps in all directions then repeated on opposite side  Tandem walking on air ex x 6 reps  Step ups on 6 in step: step up/up then down/down then stepping up and over and then stepping backwards up and over all x 10 each NuStep seat at 8, level 4, steps 638 x 10 min 7 sec to warm up with v/c to avoid hip adduction while educating pt on awareness of posture and neck retraction exercise to help with posture Standing on air ex: mini squats x 10 with v/c to avoid hip adduction with no HHA Hip machine x 25 lbs x 10 reps in direction of flexion, abduction and ER with pt returning therapist demo Leg press seat at 5 x 70 lbs x 20  reps Standing with purple ball behind head doing chin tucks while performing 3 way shoulder with 1 lbs weight x 10 reps in each direction Scapular retraction on dual cable machine x 7 lbs x 10 reps with pt returning therapist demo In supine over 1/2 foam roll: horizontal abduction x 10 to stretch chest, snow angels x 10 with pt very limited in ROM due to tightness, lying with arms outstretched x 3 min  08/30/23: NuStep seat at 8, level 3, steps 658 for 10:13 to warm up with v/c to avoid hip adduction while educating pt on awareness of posture and neck retraction exercise to help with posture Neuro Re  Ed: In // bars:  Standing on blue oval air ex on R LE while doing 3 way hip on L LE to keep movement slow and controlled throughout with hand held assist to maintain balance x 10 reps in all directions then repeated on opposite side  Tandem walking on air ex x 6 reps  Step ups on 6 in step: step up/up then down/down then stepping up and over and then stepping backwards up and over all x 10 each Standing on air ex: mini squats x 10 with v/c to avoid hip adduction Practicing ascending/descending 1 flight of steps first with 1 HHA then with no HHA with v/c on form and importance of activation of muscles in feet for balance 08/29/23- none today due to time constraints   PATIENT EDUCATION:  Education details: goals for therapy, how she is currently vs at d/c last episode Person educated: Patient Education method: Explanation Education comprehension: verbalized understanding  HOME EXERCISE PROGRAM: To be determined  ASSESSMENT:  CLINICAL IMPRESSION: Pt reports she had a little soreness after last session but it did not last long. Added new strengthening exercises today including hip machine and leg press. Pt did very well with these. Began posture exercises and pec stretches.   OBJECTIVE IMPAIRMENTS: difficulty walking, decreased strength, increased fascial restrictions, impaired flexibility, and  postural dysfunction.   ACTIVITY LIMITATIONS: squatting, stairs, bed mobility, and locomotion level  PARTICIPATION LIMITATIONS: shopping and community activity  PERSONAL FACTORS: Age, Fitness, and Time since onset of injury/illness/exacerbation are also affecting patient's functional outcome, visual disturbances.   REHAB POTENTIAL: Good  CLINICAL DECISION MAKING: Evolving/moderate complexity  EVALUATION COMPLEXITY: Moderate  GOALS: Goals reviewed with patient? Yes  SHORT TERM GOALS: Target date: 09/12/23  Pt will be independent with initial HEP for stretching and strengthening.  Baseline: Goal status: INITIAL  2.  Pt will be able to complete 12 sit to stands in 30 sec without use of UEs to decrease fall risk.  Baseline:  Goal status: INITIAL  LONG TERM GOALS: Target date: 09/26/23  Pt will be able to complete 13 sit to stands in 30 sec without use of UEs to decrease fall risk. Baseline:  Goal status: INITIAL  2.  Pt will demonstrate 5/5 bilateral hip flexor strength to decrease fall risk.  Baseline:  Goal status: INITIAL  3.  Pt will score at least 51 on the BERG balance to decrease fall risk.  Baseline:  Goal status: INITIAL  4.  Pt will report she is able to go down a flight a steps without fear of falling to decrease risk of falling.  Baseline:  Goal status: INITIAL  5.  Pt will be independent in a final home exercise program for continued stretching and strengthening independently.  Baseline:  Goal status: INITIAL   PLAN:  PT FREQUENCY: 2x/week  PT DURATION: 4 weeks  PLANNED INTERVENTIONS: 97164- PT Re-evaluation, 97750- Physical Performance Testing, 97110-Therapeutic exercises, 97530- Therapeutic activity, [id number]- Neuromuscular re-education, 97535- Self Care, [id number]- Manual therapy, and 97116- Gait training  PLAN FOR NEXT SESSION: work on SLS and narrow base of support, hip strengthening, pec stretching   [employer], PT 09/05/2023, 4:06 PM

## 2023-09-06 ENCOUNTER — Ambulatory Visit (HOSPITAL_COMMUNITY)
Admission: RE | Admit: 2023-09-06 | Discharge: 2023-09-06 | Disposition: A | Discharge status: Home / Self Care | Source: Ambulatory Visit | Attending: Neurology | Admitting: Neurology

## 2023-09-06 DIAGNOSIS — H538 Other visual disturbances: Secondary | ICD-10-CM | POA: Insufficient documentation

## 2023-09-06 DIAGNOSIS — R202 Paresthesia of skin: Secondary | ICD-10-CM | POA: Insufficient documentation

## 2023-09-07 ENCOUNTER — Other Ambulatory Visit

## 2023-09-07 ENCOUNTER — Ambulatory Visit: Admitting: Physical Therapy

## 2023-09-07 ENCOUNTER — Encounter: Payer: Self-pay | Admitting: Physical Therapy

## 2023-09-07 DIAGNOSIS — E782 Mixed hyperlipidemia: Secondary | ICD-10-CM | POA: Diagnosis not present

## 2023-09-07 DIAGNOSIS — R262 Difficulty in walking, not elsewhere classified: Secondary | ICD-10-CM

## 2023-09-07 DIAGNOSIS — R293 Abnormal posture: Secondary | ICD-10-CM | POA: Diagnosis not present

## 2023-09-07 DIAGNOSIS — E039 Hypothyroidism, unspecified: Secondary | ICD-10-CM | POA: Diagnosis not present

## 2023-09-07 DIAGNOSIS — Z171 Estrogen receptor negative status [ER-]: Secondary | ICD-10-CM | POA: Diagnosis not present

## 2023-09-07 DIAGNOSIS — I214 Non-ST elevation (NSTEMI) myocardial infarction: Secondary | ICD-10-CM | POA: Diagnosis not present

## 2023-09-07 DIAGNOSIS — M810 Age-related osteoporosis without current pathological fracture: Secondary | ICD-10-CM | POA: Diagnosis not present

## 2023-09-07 DIAGNOSIS — C50412 Malignant neoplasm of upper-outer quadrant of left female breast: Secondary | ICD-10-CM | POA: Diagnosis not present

## 2023-09-07 DIAGNOSIS — M6281 Muscle weakness (generalized): Secondary | ICD-10-CM

## 2023-09-07 DIAGNOSIS — Z483 Aftercare following surgery for neoplasm: Secondary | ICD-10-CM | POA: Diagnosis not present

## 2023-09-07 NOTE — Therapy (Signed)
 OUTPATIENT PHYSICAL THERAPY ONCOLOGY TREATMENT  Patient Name: Jessica [name] MRN: [id number] DOB:1940-10-19, 83 y.o., female Today's Date: 09/07/2023  END OF SESSION:  PT End of Session - 09/07/23 1506     Visit Number 4    Number of Visits 9    Date for PT Re-Evaluation 09/26/23    PT Start Time 1504    PT Stop Time 1555    PT Time Calculation (min) 51 min    Activity Tolerance Patient tolerated treatment well    Behavior During Therapy [facility] for tasks assessed/performed           Past Medical History:  Diagnosis Date   Anemia 12/2015   Bronchitis, chronic (HCC)    in her 58s   Cataract    Chronic kidney disease    in 20s had nephritis   Closed fracture of right distal humerus    right   Coronary artery disease    Hand, foot and mouth disease (HFMD)    Hearing loss 05/03/2016   High cholesterol    Hypothyroidism    Myocardial infarction (HCC)    Osteopenia    Pericarditis 01/1999   Pneumothorax 01/1999   Thyroid  disorder    Vitamin B 12 deficiency    Past Surgical History:  Procedure Laterality Date   birthmark      removed,left leg   BREAST BIOPSY Left 09/13/2022   US  LT BREAST BX W LOC DEV 1ST LESION IMG BX SPEC US  GUIDE 09/13/2022 [facility] MAMMOGRAPHY   CARDIAC CATHETERIZATION     CATARACT EXTRACTION  2017   CORONARY STENT INTERVENTION N/A 09/01/2020   Procedure: CORONARY STENT INTERVENTION;  Surgeon: Claudene Victory ORN, MD;  Location: [facility] INVASIVE CV [facility];  Service: Cardiovascular;  Laterality: N/A;   LEFT HEART CATH AND CORONARY ANGIOGRAPHY N/A 09/01/2020   Procedure: LEFT HEART CATH AND CORONARY ANGIOGRAPHY;  Surgeon: Claudene Victory ORN, MD;  Location: [facility] INV[facility] CV [facility];  Service: Cardiovascular;  Laterality: N/A;   ORIF HUMERUS FRACTURE Right 04/10/2019   Procedure: RIGHT OPEN REDUCTION INTERNAL FIXATION (ORIF) DISTAL HUMERUS FRACTURE WITH EXTENSION;  Surgeon: Cristy Bonner DASEN, MD;  Location: [facility];  Service: Orthopedics;  Laterality: Right;    PORTACATH PLACEMENT N/A 11/15/2022   Procedure: PORT PLACEMENT WITH ULTRASOUND GUIDANCE;  Surgeon: Vanderbilt Ned, MD;  Location: [facility] OR;  Service: General;  Laterality: N/A;   SIMPLE MASTECTOMY WITH AXILLARY SENTINEL NODE BIOPSY Bilateral 10/18/2022   Procedure: BILATERAL SIMPLE MASTECTOMY;  Surgeon: Vanderbilt Ned, MD;  Location: [facility];  Service: General;  Laterality: Bilateral;  PEC BLOCK   TONSILLECTOMY AND ADENOIDECTOMY  1952?   Patient Active Problem List   Diagnosis Date Noted   Blurry vision, bilateral 08/30/2023   Paresthesia 08/30/2023   Malignant neoplasm of upper-outer quadrant of left breast in female, estrogen receptor negative (HCC) 09/21/2022   Atherosclerotic heart disease of native coronary artery with other forms of angina pectoris (HCC) 04/08/2022   Swelling of left foot 04/07/2022   Left hand fracture, sequela 04/07/2022   Depression with anxiety 04/07/2022   NSTEMI (non-ST elevated myocardial infarction) (HCC) 09/01/2020   Closed fracture of right distal humerus 04/10/2019   Dyspnea 05/09/2018   Vitamin B 12 deficiency 05/09/2018   Cough 03/06/2018   Senile osteoporosis 11/20/2017   Iron deficiency 04/26/2017   Lichen sclerosus 04/26/2017   External hemorrhoid 04/26/2017   Weakness of left leg 06/07/2016   Balance disorder 05/24/2016   Memory deficit 05/24/2016   Anemia 05/19/2016  Hearing loss 05/03/2016   Hypothyroidism 07/24/2013   Bradycardia on ECG 07/24/2013   Numbness 08/07/2012   HYPERLIPIDEMIA 12/29/2006    PCP: Fredia Bring, MD  REFERRING PROVIDER: Odean Potts, MD  REFERRING DIAG: R26.89 (ICD-10-CM) - Balance problem  THERAPY DIAG:  Muscle weakness (generalized)  Difficulty in walking, not elsewhere classified  Abnormal posture  Malignant neoplasm of upper-outer quadrant of left breast in female, estrogen receptor negative (HCC)  ONSET DATE: 10/18/22  Rationale for Evaluation and Treatment:  Rehabilitation  SUBJECTIVE:                                                                                                                                                                                           SUBJECTIVE STATEMENT: I was not sore at all after last session.   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 She underwent a bilateral mastectomy and left sentinel node biopsy (7 negative nodes) on 10/18/2022. It is triple negative with a Ki67 of 5%. She has some cardiac history and had an elbow fracture in 2022 resulting in surgery where pins were placed.   PAIN:  Are you having pain? No   PRECAUTIONS: Other: L UE lymphedema risk  WEIGHT BEARING RESTRICTIONS: No  FALLS:  Has patient fallen in last 6 months? No  LIVING ENVIRONMENT: Lives with: lives alone but at [facility] Lives in: House/apartment Stairs: No Has following equipment at home: Single point cane  OCCUPATION: retired  LEISURE: goes to water exercise 1x/wk, tries to do some walking  HAND DOMINANCE: right   PRIOR LEVEL OF FUNCTION: Independent  PATIENT GOALS: to become more stable, to have more strength   OBJECTIVE: Note: Objective measures were completed at Evaluation unless otherwise noted.  COGNITION: Overall cognitive status: Within functional limits for tasks assessed   POSTURE: forward head, rounded shoulders  LOWER EXTREMITY STRENGTH:   MMT Right Eval on 12/05/22 RIGHT  08/29/23 on EVAL  Hip flexion 5/5 4/5  Hip extension 4/5 4+/5  Hip abduction 4/5 5/5  Hip adduction     Hip internal rotation     Hip external rotation     Knee flexion 5/5 5/5  Knee extension 5/5 5/5  Ankle dorsiflexion 5/5 5/5  Ankle plantarflexion     Ankle inversion     Ankle eversion      (Blank rows = not tested)   A/PROM LEFT Eval on 12/05/22 LEFT 08/29/23 EVAL  Hip flexion 5/5 4/5  Hip extension 3/5 4/5  Hip abduction 4+/5 5/5  Hip  adduction     Hip internal rotation  Hip external rotation     Knee flexion 5/5 5/5  Knee extension 5/5 5/5  Ankle dorsiflexion 5/5 5/5  Ankle plantarflexion     Ankle inversion     Ankle eversion      (Blank rows = not tested)   FUNCTIONAL TEST:             08/29/23: 30 SEC SIT TO STAND: 11 reps which is good for her age 71/25/24: 30 SEC SIT TO STAND: 13 reps which is below average for her age 35/24/25: BERG balance test: 48/56, anything less than 45 is a greater risk of falling             01/05/23: BERG Balance Test: 51/56, anything less than 45 is a greater risk of falling   GAIT: Distance walked: 20 feet Assistive device utilized: None Level of assistance: Modified independence Comments: decreased arm swing bilaterally, occasional weaving                                                                                                                            TREATMENT DATE:  09/07/23:  NuStep seat at 8, level 5, steps 638 x 10 min 10 sec 438 steps to warm up  Neuro Re Ed: In // bars:  Standing on blue oval air ex on R LE while doing 3 way hip with no resistance but focus on keeping balance with no HHA today x 10 bilaterally Tandem walking on air ex x 6 reps with minimal use of // bars Step ups on 6 in step: step up/up then down/down then stepping up and over and then stepping backwards up and over all x 10 each Hip machine x 40 lbs x 10 reps in direction of flexion, abduction and ER with pt returning therapist demo Leg press seat at 5 x 90 lbs x 2 sets of 10 reps reps Standing with purple ball behind head doing chin tucks while performing 3 way shoulder with 1 lbs weight x 10 reps in each direction Scapular retraction on dual cable machine x 7 lbs x 10 reps with pt returning therapist demo, then push/pull x 3 lbs x 10 reps bilaterally with pt returning therapist demo In supine over 1/2 foam roll: horizontal abduction x 10 to stretch chest, snow angels x 10 with pt very  limited in ROM due to tightness, lying with arms outstretched x 3 min  09/05/23:  Neuro Re Ed: In // bars:  Standing on blue oval air ex on R LE while doing 3 way hip using 2 lb ankle weight on L LE to keep movement slow and controlled throughout with hand held assist to maintain balance x 10 reps in all directions then repeated on opposite side  Tandem walking on air ex x 6 reps  Step ups on 6 in step: step up/up then down/down then stepping up and over and then stepping backwards up and over all x 10 each NuStep seat at 8,  level 4, steps 638 x 10 min 7 sec to warm up with v/c to avoid hip adduction while educating pt on awareness of posture and neck retraction exercise to help with posture Standing on air ex: mini squats x 10 with v/c to avoid hip adduction with no HHA Hip machine x 25 lbs x 10 reps in direction of flexion, abduction and ER with pt returning therapist demo Leg press seat at 5 x 70 lbs x 20 reps Standing with purple ball behind head doing chin tucks while performing 3 way shoulder with 1 lbs weight x 10 reps in each direction Scapular retraction on dual cable machine x 7 lbs x 10 reps with pt returning therapist demo In supine over 1/2 foam roll: horizontal abduction x 10 to stretch chest, snow angels x 10 with pt very limited in ROM due to tightness, lying with arms outstretched x 3 min  08/30/23: NuStep seat at 8, level 3, steps 658 for 10:13 to warm up with v/c to avoid hip adduction while educating pt on awareness of posture and neck retraction exercise to help with posture Neuro Re Ed: In // bars:  Standing on blue oval air ex on R LE while doing 3 way hip on L LE to keep movement slow and controlled throughout with hand held assist to maintain balance x 10 reps in all directions then repeated on opposite side  Tandem walking on air ex x 6 reps  Step ups on 6 in step: step up/up then down/down then stepping up and over and then stepping backwards up and over all x 10  each Standing on air ex: mini squats x 10 with v/c to avoid hip adduction Practicing ascending/descending 1 flight of steps first with 1 HHA then with no HHA with v/c on form and importance of activation of muscles in feet for balance 08/29/23- none today due to time constraints   PATIENT EDUCATION:  Education details: goals for therapy, how she is currently vs at d/c last episode Person educated: Patient Education method: Explanation Education comprehension: verbalized understanding  HOME EXERCISE PROGRAM: To be determined  ASSESSMENT:  CLINICAL IMPRESSION: Pt did not have any soreness after last session. Increased resistance on hip machine, leg press and NuStep today and pt did very well. She required less use of // bars for balance during airex tandem walk and 3 way hip with standing leg on foam. She still requires frequent cues for forward head and is able to correct but fatigues quickly.   OBJECTIVE IMPAIRMENTS: difficulty walking, decreased strength, increased fascial restrictions, impaired flexibility, and postural dysfunction.   ACTIVITY LIMITATIONS: squatting, stairs, bed mobility, and locomotion level  PARTICIPATION LIMITATIONS: shopping and community activity  PERSONAL FACTORS: Age, Fitness, and Time since onset of injury/illness/exacerbation are also affecting patient's functional outcome, visual disturbances.   REHAB POTENTIAL: Good  CLINICAL DECISION MAKING: Evolving/moderate complexity  EVALUATION COMPLEXITY: Moderate  GOALS: Goals reviewed with patient? Yes  SHORT TERM GOALS: Target date: 09/12/23  Pt will be independent with initial HEP for stretching and strengthening.  Baseline: Goal status: INITIAL  2.  Pt will be able to complete 12 sit to stands in 30 sec without use of UEs to decrease fall risk.  Baseline:  Goal status: INITIAL  LONG TERM GOALS: Target date: 09/26/23  Pt will be able to complete 13 sit to stands in 30 sec without use of UEs to  decrease fall risk. Baseline:  Goal status: INITIAL  2.  Pt will demonstrate 5/5  bilateral hip flexor strength to decrease fall risk.  Baseline:  Goal status: INITIAL  3.  Pt will score at least 51 on the BERG balance to decrease fall risk.  Baseline:  Goal status: INITIAL  4.  Pt will report she is able to go down a flight a steps without fear of falling to decrease risk of falling.  Baseline:  Goal status: INITIAL  5.  Pt will be independent in a final home exercise program for continued stretching and strengthening independently.  Baseline:  Goal status: INITIAL   PLAN:  PT FREQUENCY: 2x/week  PT DURATION: 4 weeks  PLANNED INTERVENTIONS: 97164- PT Re-evaluation, 97750- Physical Performance Testing, 97110-Therapeutic exercises, 97530- Therapeutic activity, [id number]- Neuromuscular re-education, 97535- Self Care, [id number]- Manual therapy, and 97116- Gait training  PLAN FOR NEXT SESSION: work on SLS and narrow base of support, hip strengthening, pec stretching   [employer], PT 09/07/2023, 3:57 PM

## 2023-09-08 ENCOUNTER — Ambulatory Visit: Payer: Self-pay | Admitting: Neurology

## 2023-09-08 LAB — COMPLETE METABOLIC PANEL WITHOUT GFR
AG Ratio: 1.9 (calc) (ref 1.0–2.5)
ALT: 14 U/L (ref 6–29)
AST: 15 U/L (ref 10–35)
Albumin: 3.9 g/dL (ref 3.6–5.1)
Alkaline phosphatase (APISO): 41 U/L (ref 37–153)
BUN/Creatinine Ratio: 25 (calc) — ABNORMAL HIGH (ref 6–22)
BUN: 15 mg/dL (ref 7–25)
CO2: 28 mmol/L (ref 20–32)
Calcium: 8.7 mg/dL (ref 8.6–10.4)
Chloride: 107 mmol/L (ref 98–110)
Creat: 0.59 mg/dL — ABNORMAL LOW (ref 0.60–0.95)
Globulin: 2.1 g/dL (ref 1.9–3.7)
Glucose, Bld: 94 mg/dL (ref 65–99)
Potassium: 4.1 mmol/L (ref 3.5–5.3)
Sodium: 139 mmol/L (ref 135–146)
Total Bilirubin: 0.5 mg/dL (ref 0.2–1.2)
Total Protein: 6 g/dL — ABNORMAL LOW (ref 6.1–8.1)

## 2023-09-08 LAB — LIPID PANEL
Cholesterol: 131 mg/dL (ref <200)
HDL: 52 mg/dL (ref >=50)
LDL Cholesterol (Calc): 61 mg/dL
Non-HDL Cholesterol (Calc): 79 mg/dL (ref <130)
Total CHOL/HDL Ratio: 2.5 (calc) (ref <5.0)
Triglycerides: 96 mg/dL (ref <150)

## 2023-09-08 LAB — CBC WITH DIFFERENTIAL/PLATELET
Absolute Lymphocytes: 1277 {cells}/uL (ref 850–3900)
Absolute Monocytes: 490 {cells}/uL (ref 200–950)
Basophils Absolute: 37 {cells}/uL (ref 0–200)
Basophils Relative: 0.6 %
Eosinophils Absolute: 217 {cells}/uL (ref 15–500)
Eosinophils Relative: 3.5 %
HCT: 39.4 % (ref 35.0–45.0)
Hemoglobin: 13.3 g/dL (ref 11.7–15.5)
MCH: 33.3 pg — ABNORMAL HIGH (ref 27.0–33.0)
MCHC: 33.8 g/dL (ref 32.0–36.0)
MCV: 98.5 fL (ref 80.0–100.0)
MPV: 10.9 fL (ref 7.5–12.5)
Monocytes Relative: 7.9 %
Neutro Abs: 4179 {cells}/uL (ref 1500–7800)
Neutrophils Relative %: 67.4 %
Platelets: 194 Thousand/uL (ref 140–400)
RBC: 4 Million/uL (ref 3.80–5.10)
RDW: 13.1 % (ref 11.0–15.0)
Total Lymphocyte: 20.6 %
WBC: 6.2 Thousand/uL (ref 3.8–10.8)

## 2023-09-08 LAB — TSH: TSH: 1.39 m[IU]/L (ref 0.40–4.50)

## 2023-09-11 NOTE — Patient Instructions (Signed)
 SURGICAL WAITING ROOM VISITATION  Patients having surgery or a procedure may have no more than 2 support people in the waiting area - these visitors may rotate.    Children under the age of 74 must have an adult with them who is not the patient.  Visitors with respiratory illnesses are discouraged from visiting and should remain at home.  If the patient needs to stay at the hospital during part of their recovery, the visitor guidelines for inpatient rooms apply. Pre-op nurse will coordinate an appropriate time for 1 support person to accompany patient in pre-op.  This support person may not rotate.    Please refer to the [facility] website for the visitor guidelines for Inpatients (after your surgery is over and you are in a regular room).    Your procedure is scheduled on: 09/20/23   Report to [facility] Main Entrance    Report to admitting at 6:15 AM   Call this number if you have problems the morning of surgery [phone]   Do not eat food :After Midnight.   After Midnight you may have the following liquids until 5:30 AM DAY OF SURGERY  Water Non-Citrus Juices (without pulp, NO RED-Apple, White grape, White cranberry) Black Coffee (NO MILK/CREAM OR CREAMERS, sugar ok)  Clear Tea (NO MILK/CREAM OR CREAMERS, sugar ok) regular and decaf                             Plain Jell-O (NO RED)                                           Fruit ices (not with fruit pulp, NO RED)                                     Popsicles (NO RED)                                                               Sports drinks like Gatorade (NO RED)           If you have questions, please contact your surgeon's office.   FOLLOW BOWEL PREP AND ANY ADDITIONAL PRE OP INSTRUCTIONS YOU RECEIVED FROM YOUR SURGEON'S OFFICE!!!     Oral Hygiene is also important to reduce your risk of infection.                                    Remember - BRUSH YOUR TEETH THE MORNING OF SURGERY WITH YOUR REGULAR  TOOTHPASTE  DENTURES WILL BE REMOVED PRIOR TO SURGERY PLEASE DO NOT APPLY Poly grip OR ADHESIVES!!!   Stop all vitamins and herbal supplements 7 days before surgery.   Take these medicines the morning of surgery with A SIP OF WATER: Tylenol , Levothyroxine , Claritin                                You may not have any  metal on your body including hair pins, jewelry, and body piercing             Do not wear make-up, lotions, powders, perfumes, or deodorant  Do not wear nail polish including gel and S&S, artificial/acrylic nails, or any other type of covering on natural nails including finger and toenails. If you have artificial nails, gel coating, etc. that needs to be removed by a nail salon please have this removed prior to surgery or surgery may need to be canceled/ delayed if the surgeon/ anesthesia feels like they are unable to be safely monitored.   Do not shave  48 hours prior to surgery.    Do not bring valuables to the hospital. [place] [facility] IS NOT             RESPONSIBLE   FOR VALUABLES.   Contacts, glasses, dentures or bridgework may not be worn into surgery.  DO NOT BRING YOUR HOME MEDICATIONS TO THE HOSPITAL. PHARMACY WILL DISPENSE MEDICATIONS LISTED ON YOUR MEDICATION LIST TO YOU DURING YOUR ADMISSION IN THE HOSPITAL!    Patients discharged on the day of surgery will not be allowed to drive home.  Someone NEEDS to stay with you for the first 24 hours after anesthesia.              Please read over the following fact sheets you were given: IF YOU HAVE QUESTIONS ABOUT YOUR PRE-OP INSTRUCTIONS PLEASE CALL [phone] [place]   If you received a COVID test during your pre-op visit  it is requested that you wear a mask when out in public, stay away from anyone that may not be feeling well and notify your surgeon if you develop symptoms. If you test positive for Covid or have been in contact with anyone that has tested positive in the last 10 days please notify you surgeon.     [place] - Preparing for Surgery Before surgery, you can play an important role.  Because skin is not sterile, your skin needs to be as free of germs as possible.  You can reduce the number of germs on your skin by washing with CHG (chlorahexidine gluconate) soap before surgery.  CHG is an antiseptic cleaner which kills germs and bonds with the skin to continue killing germs even after washing. Please DO NOT use if you have an allergy to CHG or antibacterial soaps.  If your skin becomes reddened/irritated stop using the CHG and inform your nurse when you arrive at Short Stay. Do not shave (including legs and underarms) for at least 48 hours prior to the first CHG shower.  You may shave your face/neck.  Please follow these instructions carefully:  1.  Shower with CHG Soap the night before surgery and the  morning of surgery.  2.  If you choose to wash your hair, wash your hair first as usual with your normal  shampoo.  3.  After you shampoo, rinse your hair and body thoroughly to remove the shampoo.                             4.  Use CHG as you would any other liquid soap.  You can apply chg directly to the skin and wash.  Gently with a scrungie or clean washcloth.  5.  Apply the CHG Soap to your body ONLY FROM THE NECK DOWN.   Do   not use on face/ open  Wound or open sores. Avoid contact with eyes, ears mouth and   genitals (private parts).                       Wash face,  Genitals (private parts) with your normal soap.             6.  Wash thoroughly, paying special attention to the area where your    surgery  will be performed.  7.  Thoroughly rinse your body with warm water from the neck down.  8.  DO NOT shower/wash with your normal soap after using and rinsing off the CHG Soap.                9.  Pat yourself dry with a clean towel.            10.  Wear clean pajamas.            11.  Place clean sheets on your bed the night of your first shower and do not  sleep  with pets. Day of Surgery : Do not apply any lotions/deodorants the morning of surgery.  Please wear clean clothes to the hospital/surgery center.  FAILURE TO FOLLOW THESE INSTRUCTIONS MAY RESULT IN THE CANCELLATION OF YOUR SURGERY  PATIENT SIGNATURE_________________________________  NURSE SIGNATURE__________________________________  ________________________________________________________________________

## 2023-09-11 NOTE — Progress Notes (Signed)
 COVID Vaccine Completed: yes  Date of COVID positive in last 90 days:  PCP - Fredia Bring, MD Cardiologist - Ozell Fell, MD Neurologist- Lady Callander, MD  Cardiac clearance by Jon Hails, PA 08/17/23  Chest x-ray - 08/15/22 Epic EKG - 08/17/23 Epic Stress Test - 11/08/13 Epic ECHO - 08/29/22 Epic Cardiac Cath - 09/01/20 Epic Pacemaker/ICD device last checked: Spinal Cord Stimulator:  Bowel Prep -   Sleep Study -  CPAP -   Fasting Blood Sugar -  Checks Blood Sugar _____ times a day  Last dose of GLP1 agonist-  N/A GLP1 instructions:  Do not take after     Last dose of SGLT-2 inhibitors-  N/A SGLT-2 instructions:  Do not take after     Blood Thinner Instructions:  Plavix  hold 5-7 days. Prefer  to take ASA while off Plavix  per cards Aspirin  Instructions: Last Dose:  Activity level:  Can go up a flight of stairs and perform activities of daily living without stopping and without symptoms of chest pain or shortness of breath.  Able to exercise without symptoms  Unable to go up a flight of stairs without symptoms of     Anesthesia review: NSTEMI, atherosclerotic heart disease, anemia, CAD, CKD  Patient denies shortness of breath, fever, cough and chest pain at PAT appointment  Patient verbalized understanding of instructions that were given to them at the PAT appointment. Patient was also instructed that they will need to review over the PAT instructions again at home before surgery.

## 2023-09-12 ENCOUNTER — Other Ambulatory Visit: Payer: Self-pay

## 2023-09-12 ENCOUNTER — Encounter: Payer: Self-pay | Admitting: Physical Therapy

## 2023-09-12 ENCOUNTER — Encounter (HOSPITAL_COMMUNITY): Payer: Self-pay

## 2023-09-12 ENCOUNTER — Ambulatory Visit: Admitting: Physical Therapy

## 2023-09-12 ENCOUNTER — Encounter (HOSPITAL_COMMUNITY)
Admission: RE | Admit: 2023-09-12 | Discharge: 2023-09-12 | Disposition: A | Discharge status: Home / Self Care | Source: Ambulatory Visit | Attending: Surgery | Admitting: Surgery

## 2023-09-12 DIAGNOSIS — M6281 Muscle weakness (generalized): Secondary | ICD-10-CM | POA: Diagnosis not present

## 2023-09-12 DIAGNOSIS — Z01812 Encounter for preprocedural laboratory examination: Secondary | ICD-10-CM | POA: Diagnosis not present

## 2023-09-12 DIAGNOSIS — C50412 Malignant neoplasm of upper-outer quadrant of left female breast: Secondary | ICD-10-CM | POA: Diagnosis not present

## 2023-09-12 DIAGNOSIS — Z01818 Encounter for other preprocedural examination: Secondary | ICD-10-CM | POA: Diagnosis present

## 2023-09-12 DIAGNOSIS — Z171 Estrogen receptor negative status [ER-]: Secondary | ICD-10-CM | POA: Diagnosis not present

## 2023-09-12 DIAGNOSIS — R262 Difficulty in walking, not elsewhere classified: Secondary | ICD-10-CM | POA: Diagnosis not present

## 2023-09-12 DIAGNOSIS — R293 Abnormal posture: Secondary | ICD-10-CM

## 2023-09-12 DIAGNOSIS — Z483 Aftercare following surgery for neoplasm: Secondary | ICD-10-CM | POA: Diagnosis not present

## 2023-09-12 HISTORY — DX: Malignant (primary) neoplasm, unspecified: C80.1

## 2023-09-12 HISTORY — DX: Elevated lipoprotein(a): E78.41

## 2023-09-12 HISTORY — DX: Polyneuropathy, unspecified: G62.9

## 2023-09-12 NOTE — Therapy (Signed)
 OUTPATIENT PHYSICAL THERAPY ONCOLOGY TREATMENT  Patient Name: Jessica [name] MRN: [id number] DOB:25-Jul-1940, 83 y.o., female Today's Date: 09/12/2023  END OF SESSION:  PT End of Session - 09/12/23 0808     Visit Number 5    Number of Visits 9    Date for PT Re-Evaluation 09/26/23    PT Start Time 0803    PT Stop Time 0854    PT Time Calculation (min) 51 min    Activity Tolerance Patient tolerated treatment well    Behavior During Therapy [facility] for tasks assessed/performed            Past Medical History:  Diagnosis Date   Anemia 12/2015   Bronchitis, chronic (HCC)    in her 80s   Cataract    Chronic kidney disease    in 20s had nephritis   Closed fracture of right distal humerus    right   Coronary artery disease    Hand, foot and mouth disease (HFMD)    Hearing loss 05/03/2016   High cholesterol    Hypothyroidism    Myocardial infarction (HCC)    Osteopenia    Pericarditis 01/1999   Pneumothorax 01/1999   Thyroid  disorder    Vitamin B 12 deficiency    Past Surgical History:  Procedure Laterality Date   birthmark      removed,left leg   BREAST BIOPSY Left 09/13/2022   US  LT BREAST BX W LOC DEV 1ST LESION IMG BX SPEC US  GUIDE 09/13/2022 [facility] MAMMOGRAPHY   CARDIAC CATHETERIZATION     CATARACT EXTRACTION  2017   CORONARY STENT INTERVENTION N/A 09/01/2020   Procedure: CORONARY STENT INTERVENTION;  Surgeon: Claudene Victory ORN, MD;  [facility]: [facility];  Service: Cardiovascular;  Laterality: N/A;   LEFT HEART CATH AND CORONARY ANGIOGRAPHY N/A 09/01/2020   Procedure: LEFT HEART CATH AND CORONARY ANGIOGRAPHY;  Surgeon: Claudene Victory ORN, MD;  Location: [facility];  Service: Cardiovascular;  Laterality: N/A;   ORIF HUMERUS FRACTURE Right 04/10/2019   Procedure: RIGHT OPEN REDUCTION INTERNAL FIXATION (ORIF) DISTAL HUMERUS FRACTURE WITH EXTENSION;  Surgeon: Cristy Bonner DASEN, MD;  Location: [facility];  Service: Orthopedics;  Laterality: Right;    PORTACATH PLACEMENT N/A 11/15/2022   Procedure: PORT PLACEMENT WITH ULTRASOUND GUIDANCE;  Surgeon: Vanderbilt Ned, MD;  Location: [facility];  Service: General;  Laterality: N/A;   SIMPLE MASTECTOMY WITH AXILLARY SENTINEL NODE BIOPSY Bilateral 10/18/2022   Procedure: BILATERAL SIMPLE MASTECTOMY;  Surgeon: Vanderbilt Ned, MD;  Location: [facility];  Service: General;  Laterality: Bilateral;  PEC BLOCK   TONSILLECTOMY AND ADENOIDECTOMY  1952?   Patient Active Problem List   Diagnosis Date Noted   Blurry vision, bilateral 08/30/2023   Paresthesia 08/30/2023   Malignant neoplasm of upper-outer quadrant of left breast in female, estrogen receptor negative (HCC) 09/21/2022   Atherosclerotic heart disease of native coronary artery with other forms of angina pectoris (HCC) 04/08/2022   Swelling of left foot 04/07/2022   Left hand fracture, sequela 04/07/2022   Depression with anxiety 04/07/2022   NSTEMI (non-ST elevated myocardial infarction) (HCC) 09/01/2020   Closed fracture of right distal humerus 04/10/2019   Dyspnea 05/09/2018   Vitamin B 12 deficiency 05/09/2018   Cough 03/06/2018   Senile osteoporosis 11/20/2017   Iron deficiency 04/26/2017   Lichen sclerosus 04/26/2017   External hemorrhoid 04/26/2017   Weakness of left leg 06/07/2016   Balance disorder 05/24/2016   Memory deficit 05/24/2016   Anemia  05/19/2016   Hearing loss 05/03/2016   Hypothyroidism 07/24/2013   Bradycardia on ECG 07/24/2013   Numbness 08/07/2012   HYPERLIPIDEMIA 12/29/2006    PCP: Fredia Bring, MD  REFERRING PROVIDER: Odean Potts, MD  REFERRING DIAG: R26.89 (ICD-10-CM) - Balance problem  THERAPY DIAG:  Muscle weakness (generalized)  Difficulty in walking, not elsewhere classified  Abnormal posture  Malignant neoplasm of upper-outer quadrant of left breast in female, estrogen receptor negative (HCC)  ONSET DATE: 10/18/22  Rationale for Evaluation and Treatment:  Rehabilitation  SUBJECTIVE:                                                                                                                                                                                           SUBJECTIVE STATEMENT: I did not have any soreness.   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 She underwent a bilateral mastectomy and left sentinel node biopsy (7 negative nodes) on 10/18/2022. It is triple negative with a Ki67 of 5%. She has some cardiac history and had an elbow fracture in 2022 resulting in surgery where pins were placed.   PAIN:  Are you having pain? No   PRECAUTIONS: Other: L UE lymphedema risk  WEIGHT BEARING RESTRICTIONS: No  FALLS:  Has patient fallen in last 6 months? No  LIVING ENVIRONMENT: Lives with: lives alone but at [facility] Lives in: House/apartment Stairs: No Has following equipment at home: Single point cane  OCCUPATION: retired  LEISURE: goes to water exercise 1x/wk, tries to do some walking  HAND DOMINANCE: right   PRIOR LEVEL OF FUNCTION: Independent  PATIENT GOALS: to become more stable, to have more strength   OBJECTIVE: Note: Objective measures were completed at Evaluation unless otherwise noted.  COGNITION: Overall cognitive status: Within functional limits for tasks assessed   POSTURE: forward head, rounded shoulders  LOWER EXTREMITY STRENGTH:   MMT Right Eval on 12/05/22 RIGHT  08/29/23 on EVAL  Hip flexion 5/5 4/5  Hip extension 4/5 4+/5  Hip abduction 4/5 5/5  Hip adduction     Hip internal rotation     Hip external rotation     Knee flexion 5/5 5/5  Knee extension 5/5 5/5  Ankle dorsiflexion 5/5 5/5  Ankle plantarflexion     Ankle inversion     Ankle eversion      (Blank rows = not tested)   A/PROM LEFT Eval on 12/05/22 LEFT 08/29/23 EVAL  Hip flexion 5/5 4/5  Hip extension 3/5 4/5  Hip abduction 4+/5 5/5  Hip adduction      Hip internal rotation  Hip external rotation     Knee flexion 5/5 5/5  Knee extension 5/5 5/5  Ankle dorsiflexion 5/5 5/5  Ankle plantarflexion     Ankle inversion     Ankle eversion      (Blank rows = not tested)   FUNCTIONAL TEST:             08/29/23: 30 SEC SIT TO STAND: 11 reps which is good for her age 19/25/24: 30 SEC SIT TO STAND: 13 reps which is below average for her age 17/24/25: BERG balance test: 48/56, anything less than 45 is a greater risk of falling             01/05/23: BERG Balance Test: 51/56, anything less than 45 is a greater risk of falling   GAIT: Distance walked: 20 feet Assistive device utilized: None Level of assistance: Modified independence Comments: decreased arm swing bilaterally, occasional weaving                                                                                                                            TREATMENT DATE:  09/12/23:  NuStep seat at 7, level 5 x 10 min 25 sec 539 steps to warm up  Neuro Re Ed: In // bars:  Standing on blue oval air ex on R LE while doing 3 way hip with no resistance but focus on keeping balance with no HHA today or loss of balance x 10 bilaterally Mini squats x 15 reps on air ex pad with no use of // bars for balance Tandem walking on air ex x 6 reps with very little use of finger tips on // bars for balance Hip machine x 45 lbs x 15 reps in direction of flexion, abduction and ER with pt returning therapist demo Leg press seat at 5 x 90 lbs x 20 reps reps Scapular retraction on dual cable machine x 7 lbs x 20 reps (attempted 10 lbs but it was too much) with pt returning therapist demo, then push/pull x 3 lbs x 20 reps bilaterally with pt returning therapist demo (attempted 7lbs but was too much), ER returning therapist demo bilaterally 3 lbs, shoulder extension x 3 lbs x 10 reps bilaterally  09/07/23:  NuStep seat at 8, level 5, steps 638 x 10 min 10 sec 438 steps to warm up  Neuro Re Ed: In // bars:   Standing on blue oval air ex on R LE while doing 3 way hip with no resistance but focus on keeping balance with no HHA today x 10 bilaterally Tandem walking on air ex x 6 reps with minimal use of // bars Step ups on 6 in step: step up/up then down/down then stepping up and over and then stepping backwards up and over all x 10 each Hip machine x 40 lbs x 10 reps in direction of flexion, abduction and ER with pt returning therapist demo Leg press seat at 5 x 90 lbs x 2  sets of 10 reps reps Standing with purple ball behind head doing chin tucks while performing 3 way shoulder with 1 lbs weight x 10 reps in each direction Scapular retraction on dual cable machine x 7 lbs x 10 reps with pt returning therapist demo, then push/pull x 3 lbs x 10 reps bilaterally with pt returning therapist demo In supine over 1/2 foam roll: horizontal abduction x 10 to stretch chest, snow angels x 10 with pt very limited in ROM due to tightness, lying with arms outstretched x 3 min  09/05/23:  Neuro Re Ed: In // bars:  Standing on blue oval air ex on R LE while doing 3 way hip using 2 lb ankle weight on L LE to keep movement slow and controlled throughout with hand held assist to maintain balance x 10 reps in all directions then repeated on opposite side  Tandem walking on air ex x 6 reps  Step ups on 6 in step: step up/up then down/down then stepping up and over and then stepping backwards up and over all x 10 each NuStep seat at 8, level 4, steps 638 x 10 min 7 sec to warm up with v/c to avoid hip adduction while educating pt on awareness of posture and neck retraction exercise to help with posture Standing on air ex: mini squats x 10 with v/c to avoid hip adduction with no HHA Hip machine x 25 lbs x 10 reps in direction of flexion, abduction and ER with pt returning therapist demo Leg press seat at 5 x 70 lbs x 20 reps Standing with purple ball behind head doing chin tucks while performing 3 way shoulder with 1 lbs  weight x 10 reps in each direction Scapular retraction on dual cable machine x 7 lbs x 10 reps with pt returning therapist demo In supine over 1/2 foam roll: horizontal abduction x 10 to stretch chest, snow angels x 10 with pt very limited in ROM due to tightness, lying with arms outstretched x 3 min  08/30/23: NuStep seat at 8, level 3, steps 658 for 10:13 to warm up with v/c to avoid hip adduction while educating pt on awareness of posture and neck retraction exercise to help with posture Neuro Re Ed: In // bars:  Standing on blue oval air ex on R LE while doing 3 way hip on L LE to keep movement slow and controlled throughout with hand held assist to maintain balance x 10 reps in all directions then repeated on opposite side  Tandem walking on air ex x 6 reps  Step ups on 6 in step: step up/up then down/down then stepping up and over and then stepping backwards up and over all x 10 each Standing on air ex: mini squats x 10 with v/c to avoid hip adduction Practicing ascending/descending 1 flight of steps first with 1 HHA then with no HHA with v/c on form and importance of activation of muscles in feet for balance 08/29/23- none today due to time constraints   PATIENT EDUCATION:  Education details: goals for therapy, how she is currently vs at d/c last episode Person educated: Patient Education method: Explanation Education comprehension: verbalized understanding  HOME EXERCISE PROGRAM: To be determined  ASSESSMENT:  CLINICAL IMPRESSION: Pt demonstrated great improvement in balance today with tandem walking and 3 way hip on air ex blue oval. She did not need to // bars for balance nearly as much this session as the last and her movements were more controlled. Added  additional UE exercises on cable machine for posture. Pt will be out for the next approximately 4 weeks while she undergoes hernia surgery.   OBJECTIVE IMPAIRMENTS: difficulty walking, decreased strength, increased fascial  restrictions, impaired flexibility, and postural dysfunction.   ACTIVITY LIMITATIONS: squatting, stairs, bed mobility, and locomotion level  PARTICIPATION LIMITATIONS: shopping and community activity  PERSONAL FACTORS: Age, Fitness, and Time since onset of injury/illness/exacerbation are also affecting patient's functional outcome, visual disturbances.   REHAB POTENTIAL: Good  CLINICAL DECISION MAKING: Evolving/moderate complexity  EVALUATION COMPLEXITY: Moderate  GOALS: Goals reviewed with patient? Yes  SHORT TERM GOALS: Target date: 09/12/23  Pt will be independent with initial HEP for stretching and strengthening.  Baseline: Goal status: INITIAL  2.  Pt will be able to complete 12 sit to stands in 30 sec without use of UEs to decrease fall risk.  Baseline:  Goal status: INITIAL  LONG TERM GOALS: Target date: 09/26/23  Pt will be able to complete 13 sit to stands in 30 sec without use of UEs to decrease fall risk. Baseline:  Goal status: INITIAL  2.  Pt will demonstrate 5/5 bilateral hip flexor strength to decrease fall risk.  Baseline:  Goal status: INITIAL  3.  Pt will score at least 51 on the BERG balance to decrease fall risk.  Baseline:  Goal status: INITIAL  4.  Pt will report she is able to go down a flight a steps without fear of falling to decrease risk of falling.  Baseline:  Goal status: INITIAL  5.  Pt will be independent in a final home exercise program for continued stretching and strengthening independently.  Baseline:  Goal status: INITIAL   PLAN:  PT FREQUENCY: 2x/week  PT DURATION: 4 weeks  PLANNED INTERVENTIONS: 97164- PT Re-evaluation, 97750- Physical Performance Testing, 97110-Therapeutic exercises, 97530- Therapeutic activity, [id number]- Neuromuscular re-education, 97535- Self Care, [id number]- Manual therapy, and 97116- Gait training  PLAN FOR NEXT SESSION: reassess goals, continue to progress UE and LE strengthening exercises. work on [employer] and  narrow base of support, hip strengthening, pec stretching   [employer], PT 09/12/2023, 9:02 AM

## 2023-09-13 ENCOUNTER — Ambulatory Visit: Admitting: Internal Medicine

## 2023-09-13 ENCOUNTER — Encounter: Payer: Self-pay | Admitting: Internal Medicine

## 2023-09-13 VITALS — BP 108/74 | HR 60 | Temp 97.3°F | Ht 63.0 in | Wt 154.1 lb

## 2023-09-13 DIAGNOSIS — E039 Hypothyroidism, unspecified: Secondary | ICD-10-CM | POA: Diagnosis not present

## 2023-09-13 DIAGNOSIS — R739 Hyperglycemia, unspecified: Secondary | ICD-10-CM | POA: Diagnosis not present

## 2023-09-13 DIAGNOSIS — M810 Age-related osteoporosis without current pathological fracture: Secondary | ICD-10-CM | POA: Diagnosis not present

## 2023-09-13 DIAGNOSIS — E782 Mixed hyperlipidemia: Secondary | ICD-10-CM

## 2023-09-13 DIAGNOSIS — K429 Umbilical hernia without obstruction or gangrene: Secondary | ICD-10-CM

## 2023-09-13 DIAGNOSIS — G629 Polyneuropathy, unspecified: Secondary | ICD-10-CM

## 2023-09-13 NOTE — Progress Notes (Unsigned)
 Location:   [facility]:   Clinic  Provider:   Code Status:  Goals of Care:     09/12/2023    1:14 PM  Advanced Directives  Does Patient Have a Medical Advance Directive? Yes  Type of Advance Directive Healthcare Power of Attorney  Copy of Healthcare Power of Attorney in Chart? Yes - validated most recent copy scanned in chart (See row information)     Chief Complaint  Patient presents with   Medical Management of Chronic Issues    3 month follow up. Patient does have some things to speak with you about. Due for Covid vaccine and AWV.    HPI: Patient is a 83 y.o. female seen today for medical management of chronic diseases.   [place]ives in [place] in [facility]  Her History includes Diagnosed with Triple Negative Invasive Lobular Cancer in her Left Breast She decided to undergo Bilateral Mastectomy Followed by Chemo and Radiation therapy Finished in 3/25   Neuropathy Has seen Dr Onita Gartner Worsened since Chemo    Patient h/o  NSTEMI 06/22   Was seen by Dr onita recently for Double vision/Blurry vision MRI negative  Umbilical Hernia Plan for surgery next week       Past Medical History:  Diagnosis Date   Anemia 12/2015   Bronchitis, chronic (HCC)    in her 67s   Cancer (HCC)    breast   Cataract    Chronic kidney disease    in 20s had nephritis   Closed fracture of right distal humerus    right   Coronary artery disease    Elevated Lp(a)    Hand, foot and mouth disease (HFMD)    Hearing loss 05/03/2016   High cholesterol    Hypothyroidism    Myocardial infarction (HCC)    Neuropathy    toes   Osteopenia    Pericarditis 01/1999   Pneumothorax 01/1999   Thyroid  disorder    Vitamin B 12 deficiency     Past Surgical History:  Procedure Laterality Date   birthmark      removed,left leg   BREAST BIOPSY Left 09/13/2022   US  LT BREAST BX W LOC DEV 1ST LESION IMG BX SPEC US  GUIDE 09/13/2022 [facility] MAMMOGRAPHY   CARDIAC CATHETERIZATION     CATARACT  EXTRACTION  2017   CORONARY STENT INTERVENTION N/A 09/01/2020   Procedure: CORONARY STENT INTERVENTION;  Surgeon: Claudene Victory ORN, MD;  [facility];  Service: Cardiovascular;  Laterality: N/A;   LEFT HEART CATH AND CORONARY ANGIOGRAPHY N/A 09/01/2020   Procedure: LEFT HEART CATH AND CORONARY ANGIOGRAPHY;  Surgeon: Claudene Victory ORN, MD;  [facility];  Service: Cardiovascular;  Laterality: N/A;   ORIF HUMERUS FRACTURE Right 04/10/2019   Procedure: RIGHT OPEN REDUCTION INTERNAL FIXATION (ORIF) DISTAL HUMERUS FRACTURE WITH EXTENSION;  Surgeon: Cristy Bonner DASEN, MD;  Location: [facility];  Service: Orthopedics;  Laterality: Right;   PORTACATH PLACEMENT N/A 11/15/2022   Procedure: PORT PLACEMENT WITH ULTRASOUND GUIDANCE;  Surgeon: Vanderbilt Ned, MD;  Location[facility];  Service: General;  Laterality: N/A;   SIMPLE MASTECTOMY WITH AXILLARY SENTINEL NODE BIOPSY Bilateral 10/18/2022   Procedure: BILATERAL SIMPLE MASTECTOMY;  Surgeon: Vanderbilt Ned, MD;  Location: [facility];  Service: General;  Laterality: Bilateral;  PEC BLOCK   TONSILLECTOMY AND ADENOIDECTOMY  1952?    No Known Allergies  Outpatient Encounter Medications as of 09/13/2023  Medication Sig  acetaminophen  (TYLENOL ) 500 MG tablet Take 500-1,000 mg by mouth every 6 (six) hours as needed (pain.).   ascorbic acid  (VITAMIN C) 500 MG tablet Take 500 mg by mouth daily as needed (immune support (sore throat)).   Calcium  Carbonate-Vitamin D  (CALCIUM  600+D PO) Take 1 tablet by mouth in the morning.   carboxymethylcellulose (REFRESH PLUS) 0.5 % SOLN Place 1 drop into both eyes 3 (three) times daily as needed (dry/irritated eyes.).   cholecalciferol  (VITAMIN D3) 25 MCG (1000 UNIT) tablet Take 1,000 Units by mouth in the morning.   clobetasol ointment (TEMOVATE) 0.05 % Apply 1 Application topically 4 days.   clopidogrel  (PLAVIX ) 75 MG tablet TAKE 1 TABLET BY MOUTH DAILY   denosumab  (PROLIA )  60 MG/ML SOSY injection Inject 60 mg into the skin every 6 (six) months. Administered at [facility] q35m   ferrous sulfate 325 (65 FE) MG tablet Take 325 mg by mouth every evening.   gabapentin  (NEURONTIN ) 100 MG capsule TAKE 1 CAPSULE BY MOUTH DAILY   levothyroxine  (SYNTHROID ) 112 MCG tablet TAKE 1 TABLET BY MOUTH DAILY   loratadine  (CLARITIN ) 10 MG tablet Take 10 mg by mouth as needed for allergies.   melatonin 5 MG TABS Take 5 mg by mouth at bedtime.   Multiple Vitamin (MULTIVITAMIN WITH MINERALS) TABS tablet Take 1 tablet by mouth every evening.   nitroGLYCERIN  (NITROSTAT ) 0.4 MG SL tablet DISSOLVE 1 TAB UNDER TONGUE FOR CHEST PAIN - IF PAIN REMAINS AFTER 5 MIN, CALL 911 AND REPEAT DOSE. MAX 3 TABS IN 15 MINUTES   rosuvastatin  (CRESTOR ) 20 MG tablet TAKE 1 TABLET BY MOUTH DAILY   Study - [name] - olpasiran (AMG 890) 142 mg/mL or placebo SQ injection (PI-Hilty) Inject 142 mg into the skin once. For investigational use only - Inject 1 mL (1 prefilled syringe) subcutaneously into appropriate injection site per protocol. (Approved injection site(s): upper arm, upper thigh, abdomen). Please contact [facility] if any questions.   Facility-Administered Encounter Medications as of 09/13/2023  Medication   [START ON 11/13/2023] denosumab  (PROLIA ) injection 60 mg    Review of Systems:  Review of Systems  Health Maintenance  Topic Date Due   COVID-19 Vaccine (6 - 2024-25 season) 11/06/2022   Medicare Annual Wellness (AWV)  07/11/2023   Colonoscopy  02/03/2026 (Originally 07/11/2020)   INFLUENZA VACCINE  10/06/2023   DTaP/Tdap/Td (4 - Td or Tdap) 03/24/2032   Pneumococcal Vaccine: 50+ Years  Completed   Hepatitis B Vaccines  Completed   DEXA SCAN  Completed   Zoster Vaccines- Shingrix  Completed   HPV VACCINES  Aged Out   Meningococcal B Vaccine  Aged Out    Physical Exam: Vitals:   09/13/23 1118  BP: 108/74  Pulse: 60  Temp: (!) 97.3 F (36.3 C)  SpO2: 95%   Weight: 154 lb 1.6 oz (69.9 kg)  Height: 5' 3 (1.6 m)   Body mass index is 27.3 kg/m. Physical Exam  Labs reviewed: Basic Metabolic Panel: Recent Labs    02/15/23 1057 03/15/23 1025 09/07/23 0807  NA 140 139 139  K 4.1 4.3 4.1  CL 108 108 107  CO2 27 26 28   GLUCOSE 111* 93 94  BUN 15 12 15   CREATININE 0.60 0.56 0.59*  CALCIUM  9.2 8.8* 8.7  TSH  --   --  1.39   Liver Function Tests: Recent Labs    01/25/23 1020 02/15/23 1057 03/15/23 1025 09/07/23 0807  AST 18 16 17 15   ALT 13 15  16 14  ALKPHOS 57 58 59  --   BILITOT 0.5 0.5 0.6 0.5  PROT 6.6 6.4* 6.3* 6.0*  ALBUMIN 4.2 4.1 4.0  --    No results for input(s): LIPASE, AMYLASE in the last 8760 hours. No results for input(s): AMMONIA in the last 8760 hours. CBC: Recent Labs    02/15/23 1057 03/15/23 1025 09/07/23 0807  WBC 4.3 7.7 6.2  NEUTROABS 1.7 5.5 4,179  HGB 12.6 12.5 13.3  HCT 36.7 36.5 39.4  MCV 98.1 97.6 98.5  PLT 242 213 194   Lipid Panel: Recent Labs    09/07/23 0807  CHOL 131  HDL 52  LDLCALC 61  TRIG 96  CHOLHDL 2.5   Lab Results  Component Value Date   HGBA1C 5.9 (H) 04/11/2022    Procedures since last visit: MR BRAIN WO CONTRAST Result Date: 09/08/2023 EXAM DESCRIPTION: MR BRAIN WO CONTRAST CLINICAL HISTORY: Vision impairment (Ped 0-17y) COMPARISON: None available TECHNIQUE: Non contrast MRI of the brain is performed according to our usual protocol including multiplanar multi sequence technique. FINDINGS: No acute intracranial hemorrhage, mass, edema, or hydrocephalus. Mild cortical atrophy. Small chronic infarct in the anterior right basal ganglia. No other encephalomalacia defects. Mild white matter disease suggesting chronic small vessel ischemic change. The vascular flow voids are unremarkable. No significant sinus disease. IMPRESSION: Mild age-related change without acute intracranial pathology. 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 Electronically signed by: Jessica Frees MD 09/08/2023 02:41 AM EDT RP Workstation: [id number]    Assessment/Plan 1. Umbilical hernia without obstruction and without gangrene (Primary) ***  2. Senile osteoporosis ***  3. Peripheral polyneuropathy ***  4. Mixed hyperlipidemia ***  5. Acquired hypothyroidism ***  6. Hyperglycemia ***    Labs/tests ordered:  * No order type specified * Next appt:  11/13/2023

## 2023-09-13 NOTE — Patient Instructions (Signed)
 Please have your lab work done here at [facility] on Thursday, November 20 from 7:45-8:15

## 2023-09-18 ENCOUNTER — Ambulatory Visit (HOSPITAL_COMMUNITY)
Admission: RE | Admit: 2023-09-18 | Discharge: 2023-09-18 | Disposition: A | Discharge status: Home / Self Care | Source: Ambulatory Visit | Attending: Neurology | Admitting: Neurology

## 2023-09-18 DIAGNOSIS — H538 Other visual disturbances: Secondary | ICD-10-CM | POA: Insufficient documentation

## 2023-09-18 DIAGNOSIS — R202 Paresthesia of skin: Secondary | ICD-10-CM | POA: Diagnosis not present

## 2023-09-18 NOTE — Progress Notes (Signed)
 VASCULAR LAB    Carotid duplex has been performed.  See CV proc for preliminary results.   Towanda Hornstein, RVT 09/18/2023, 11:50 AM

## 2023-09-19 NOTE — Anesthesia Preprocedure Evaluation (Signed)
 Anesthesia Evaluation  Patient identified by MRN, date of birth, ID band Patient awake    Reviewed: Allergy & Precautions, NPO status , Patient's Chart, lab work & pertinent test results  Airway Mallampati: III  TM Distance: >3 FB Neck ROM: Full    Dental  (+) Caps, Dental Advisory Given   Pulmonary neg pulmonary ROS   Pulmonary exam normal breath sounds clear to auscultation       Cardiovascular + angina  + CAD, + Past MI and + Cardiac Stents  Normal cardiovascular exam Rhythm:Regular Rate:Normal  TTE 2024 1. Left ventricular ejection fraction, by estimation, is 60 to 65%. The  left ventricle has normal function. The left ventricle has no regional  wall motion abnormalities. Left ventricular diastolic parameters were  normal.   2. Right ventricular systolic function is low normal. The right  ventricular size is normal. There is normal pulmonary artery systolic  pressure. The estimated right ventricular systolic pressure is 23.6 mmHg.   3. The mitral valve is normal in structure. Trivial mitral valve  regurgitation. No evidence of mitral stenosis.   4. The aortic valve was not well visualized. Aortic valve regurgitation  is trivial. No aortic stenosis is present.   5. The inferior vena cava is normal in size with greater than 50%  respiratory variability, suggesting right atrial pressure of 3 mmHg.   Cath 2022  Dist LAD lesion is 75% stenosed.    Subtotal occlusion of the mid LAD with distal TIMI grade II flow.  Stenosis is a Medina 010 bifurcation stenosis with the first diagonal.  Successful angioplasty and stenting using a 22 x 2.0 mm Onyx postdilated to 2.25 mm in diameter, avoiding jailing the larger first diagonal.  0% stenosis with TIMI grade III flow was noted.  Tandem 50 and 40% proximal and mid RCA stenoses.  Right coronary is codominant.  Left main is widely patent.    Circumflex is widely patent.  The  circumflex and obtuse marginals are tortuous.  Apical severe hypokinesis/akinesis.  LVEDP 6 mmHg.    Neuro/Psych  PSYCHIATRIC DISORDERS Anxiety Depression    negative neurological ROS     GI/Hepatic negative GI ROS, Neg liver ROS,,,  Endo/Other  Hypothyroidism    Renal/GU Renal disease  negative genitourinary   Musculoskeletal negative musculoskeletal ROS (+)    Abdominal   Peds  Hematology  (+) Blood dyscrasia (plavix )   Anesthesia Other Findings   Reproductive/Obstetrics                              Anesthesia Physical Anesthesia Plan  ASA: 3  Anesthesia Plan: General   Post-op Pain Management: Tylenol  PO (pre-op)*   Induction: Intravenous  PONV Risk Score and Plan: 3 and Dexamethasone , Ondansetron  and Treatment may vary due to age or medical condition  Airway Management Planned: Oral ETT  Additional Equipment:   Intra-op Plan:   Post-operative Plan: Extubation in OR  Informed Consent: I have reviewed the patients History and Physical, chart, labs and discussed the procedure including the risks, benefits and alternatives for the proposed anesthesia with the patient or authorized representative who has indicated his/her understanding and acceptance.     Dental advisory given  Plan Discussed with: CRNA  Anesthesia Plan Comments: (See PAT note 09/12/2023)         Anesthesia Quick Evaluation

## 2023-09-19 NOTE — Progress Notes (Signed)
 Anesthesia Chart Review   Case: [id number] Date/Time: 09/20/23 0815   Procedure: REPAIR, HERNIA, UMBILICAL, ADULT - POSS MESH   Anesthesia type: General   Diagnosis: Umbilical hernia without obstruction or gangrene [K42.9]   Pre-op diagnosis: UMBILICAL HERNIA   Location: [facility] ROOM 04 / [facility]   Surgeons: Lyndel Deward PARAS, MD       DISCUSSION:83 y.o. never smoker with h/o hypothyroidism on synthroid , CKD, CAD s/p DES 2022, breast cancer, umbilical hernia scheduled for above procedure 09/20/2023 with Dr. Deward Lyndel.   Pt last seen by cardiology 08/17/23.  Pt denies chest pain or shortness of breath.  She is active and walks 1-2 miles 3-4 times per week. Last echo 08/2022 with preserved LVEF 60-65%, normal RV size and function, and no significant valvular disease. Per OV note, She can complete 4.0 METS without angina. She has a history of ischemic heart disease.  As above, questionable TIA event, although difficult to determine given scenario.  Will refer to neurology.  From a cardiology standpoint, she has an RCRI of 0.9% risk of Mace.  She has no unstable cardiac conditions.  She is at acceptable risk to proceed with surgery without further cardiovascular testing.  She may hold Plavix  for 5 to 7 days prior to surgery.  We prefer that she take aspirin  while holding Plavix  throughout the perioperative period.  She can switch aspirin  back to Plavix  after surgery when safe to do so per surgeon.  Pt reports last dose of Plavix  09/14/2023 PM dose.  Seen by neurology 08/30/2023 for evaluation of transient blurry vision. Given upcoming surgery MRI brain and carotid US  ordered.  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   Carotid US  09/18/2023 Summary:  Right Carotid: Elevated proximal ICA velocities possibly secondary to  vessel  tortuosity.  Left Carotid: The extracranial vessels were near-normal  with only minimal  wall thickening or plaque. Question duplicated carotid artery.  VS: BP 129/72   Pulse 61   Temp 36.8 C (Oral)   Resp 14   Ht 5' 3 (1.6 m)   Wt 68.9 kg   SpO2 94%   BMI 26.93 kg/m   PROVIDERS: Charlanne Fredia CROME, MD is PCP  Cardiologist - Ozell Fell, MD Neurologist- Lady Callander, MD LABS: Labs reviewed: Acceptable for surgery. (all labs ordered are listed, but only abnormal results are displayed)  Labs Reviewed - No data to display   IMAGES:   EKG:   CV: Echo 08/29/22 1. Left ventricular ejection fraction, by estimation, is 60 to 65%. The  left ventricle has normal function. The left ventricle has no regional  wall motion abnormalities. Left ventricular diastolic parameters were  normal.   2. Right ventricular systolic function is low normal. The right  ventricular size is normal. There is normal pulmonary artery systolic  pressure. The estimated right ventricular systolic pressure is 23.6 mmHg.   3. The mitral valve is normal in structure. Trivial mitral valve  regurgitation. No evidence of mitral stenosis.   4. The aortic valve was not well visualized. Aortic valve regurgitation  is trivial. No aortic stenosis is present.   5. The inferior vena cava is normal in size with greater than 50%  respiratory variability, suggesting right atrial pressure of 3 mmHg.  Past Medical History:  Diagnosis Date   Anemia 12/2015   Bronchitis, chronic (HCC)    in her 24s   Cancer [facility])    breast  Cataract    Chronic kidney disease    in 20s had nephritis   Closed fracture of right distal humerus    right   Coronary artery disease    Elevated Lp(a)    Hand, foot and mouth disease (HFMD)    Hearing loss 05/03/2016   High cholesterol    Hypothyroidism    Myocardial infarction (HCC)    Neuropathy    toes   Osteopenia    Pericarditis 01/1999   Pneumothorax 01/1999   Thyroid  disorder    Vitamin B 12 deficiency     Past Surgical History:  Procedure  Laterality Date   birthmark      removed,left leg   BREAST BIOPSY Left 09/13/2022   US  LT BREAST BX W LOC DEV 1ST LESION IMG BX SPEC US  GUIDE 09/13/2022 [facility] MAMMOGRAPHY   CARDIAC CATHETERIZATION     CATARACT EXTRACTION  2017   CORONARY STENT INTERVENTION N/A 09/01/2020   Procedure: CORONARY STENT INTERVENTION;  Surgeon: Claudene Victory ORN, MD;  [facility];  Service: Cardiovascular;  Laterality: N/A;   LEFT HEART CATH AND CORONARY ANGIOGRAPHY N/A 09/01/2020   Procedure: LEFT HEART CATH AND CORONARY ANGIOGRAPHY;  Surgeon: Claudene Victory ORN, MD;  [facility];  Service: Cardiovascular;  Laterality: N/A;   ORIF HUMERUS FRACTURE Right 04/10/2019   Procedure: RIGHT OPEN REDUCTION INTERNAL FIXATION (ORIF) DISTAL HUMERUS FRACTURE WITH EXTENSION;  Surgeon: Cristy Bonner DASEN, MD;  Location: [facility];  Service: Orthopedics;  Laterality: Right;   PORTACATH PLACEMENT N/A 11/15/2022   Procedure: PORT PLACEMENT WITH ULTRASOUND GUIDANCE;  Surgeon: Vanderbilt Ned, MD;  Location[facility];  Service: General;  Laterality: N/A;   SIMPLE MASTECTOMY WITH AXILLARY SENTINEL NODE BIOPSY Bilateral 10/18/2022   Procedure: BILATERAL SIMPLE MASTECTOMY;  Surgeon: Vanderbilt Ned, MD;  Location: [facility];  Service: General;  Laterality: Bilateral;  PEC BLOCK   TONSILLECTOMY AND ADENOIDECTOMY  1952?    MEDICATIONS:  acetaminophen  (TYLENOL ) 500 MG tablet   ascorbic acid  (VITAMIN C) 500 MG tablet   Calcium  Carbonate-Vitamin D  (CALCIUM  600+D PO)   carboxymethylcellulose (REFRESH PLUS) 0.5 % SOLN   cholecalciferol  (VITAMIN D3) 25 MCG (1000 UNIT) tablet   clobetasol ointment (TEMOVATE) 0.05 %   clopidogrel  (PLAVIX ) 75 MG tablet   denosumab  (PROLIA ) 60 MG/ML SOSY injection   ferrous sulfate 325 (65 FE) MG tablet   gabapentin  (NEURONTIN ) 100 MG capsule   levothyroxine  (SYNTHROID ) 112 MCG tablet   loratadine  (CLARITIN ) 10 MG tablet   melatonin 5 MG TABS   Multiple  Vitamin (MULTIVITAMIN WITH MINERALS) TABS tablet   nitroGLYCERIN  (NITROSTAT ) 0.4 MG SL tablet   rosuvastatin  (CRESTOR ) 20 MG tablet   Study - [name] - olpasiran (AMG 890) 142 mg/mL or placebo SQ injection ([facility])    [START ON 11/13/2023] denosumab  (PROLIA ) injection 60 mg    Deondray Ospina Ward, PA-C [facility] [phone]

## 2023-09-20 ENCOUNTER — Ambulatory Visit (HOSPITAL_COMMUNITY): Payer: Self-pay | Admitting: Physician Assistant

## 2023-09-20 ENCOUNTER — Encounter (HOSPITAL_COMMUNITY): Payer: Self-pay | Admitting: Surgery

## 2023-09-20 ENCOUNTER — Other Ambulatory Visit: Payer: Self-pay

## 2023-09-20 ENCOUNTER — Ambulatory Visit (HOSPITAL_COMMUNITY)
Admission: RE | Admit: 2023-09-20 | Discharge: 2023-09-20 | Disposition: A | Discharge status: Home / Self Care | Attending: Surgery | Admitting: Surgery

## 2023-09-20 ENCOUNTER — Ambulatory Visit (HOSPITAL_COMMUNITY): Admitting: Anesthesiology

## 2023-09-20 ENCOUNTER — Encounter (HOSPITAL_COMMUNITY)
Admission: RE | Disposition: A | Discharge status: Home / Self Care | Payer: Self-pay | Source: Home / Self Care | Attending: Surgery

## 2023-09-20 DIAGNOSIS — F419 Anxiety disorder, unspecified: Secondary | ICD-10-CM

## 2023-09-20 DIAGNOSIS — N189 Chronic kidney disease, unspecified: Secondary | ICD-10-CM | POA: Diagnosis not present

## 2023-09-20 DIAGNOSIS — I2510 Atherosclerotic heart disease of native coronary artery without angina pectoris: Secondary | ICD-10-CM | POA: Diagnosis not present

## 2023-09-20 DIAGNOSIS — E039 Hypothyroidism, unspecified: Secondary | ICD-10-CM

## 2023-09-20 DIAGNOSIS — Z955 Presence of coronary angioplasty implant and graft: Secondary | ICD-10-CM | POA: Diagnosis not present

## 2023-09-20 DIAGNOSIS — I25119 Atherosclerotic heart disease of native coronary artery with unspecified angina pectoris: Secondary | ICD-10-CM | POA: Diagnosis not present

## 2023-09-20 DIAGNOSIS — I252 Old myocardial infarction: Secondary | ICD-10-CM | POA: Insufficient documentation

## 2023-09-20 DIAGNOSIS — K429 Umbilical hernia without obstruction or gangrene: Secondary | ICD-10-CM | POA: Insufficient documentation

## 2023-09-20 DIAGNOSIS — Z7902 Long term (current) use of antithrombotics/antiplatelets: Secondary | ICD-10-CM | POA: Insufficient documentation

## 2023-09-20 SURGERY — REPAIR, HERNIA, UMBILICAL, ADULT
Anesthesia: General

## 2023-09-20 MED ORDER — LACTATED RINGERS IV SOLN: INTRAVENOUS | Status: DC

## 2023-09-20 MED ORDER — FENTANYL CITRATE (PF) 100 MCG/2ML IJ SOLN
INTRAMUSCULAR | Status: AC
  Filled 2023-09-20: qty 2

## 2023-09-20 MED ORDER — VASOPRESSIN 20 UNIT/ML IV SOLN
INTRAVENOUS | Status: AC
  Filled 2023-09-20: qty 1

## 2023-09-20 MED ORDER — ORAL CARE MOUTH RINSE: 15.0000 mL | Freq: Once | OROMUCOSAL | Status: AC

## 2023-09-20 MED ORDER — ROCURONIUM BROMIDE 100 MG/10ML IV SOLN
INTRAVENOUS | Status: DC | PRN
  Administered 2023-09-20: 50 mg via INTRAVENOUS

## 2023-09-20 MED ORDER — PHENYLEPHRINE HCL (PRESSORS) 10 MG/ML IV SOLN
INTRAVENOUS | Status: AC
Start: 2023-09-20 — End: 2023-09-20
  Filled 2023-09-20: qty 1

## 2023-09-20 MED ORDER — PROPOFOL 10 MG/ML IV BOLUS
INTRAVENOUS | Status: AC
Start: 2023-09-20 — End: 2023-09-20
  Filled 2023-09-20: qty 20

## 2023-09-20 MED ORDER — ONDANSETRON HCL 4 MG/2ML IJ SOLN
INTRAMUSCULAR | Status: AC
  Filled 2023-09-20: qty 2

## 2023-09-20 MED ORDER — BUPIVACAINE-EPINEPHRINE 0.25% -1:200000 IJ SOLN
INTRAMUSCULAR | Status: DC | PRN
  Administered 2023-09-20: 23 mL

## 2023-09-20 MED ORDER — LIDOCAINE HCL (PF) 2 % IJ SOLN
INTRAMUSCULAR | Status: AC
  Filled 2023-09-20: qty 5

## 2023-09-20 MED ORDER — FENTANYL CITRATE (PF) 100 MCG/2ML IJ SOLN
INTRAMUSCULAR | Status: DC | PRN
  Administered 2023-09-20 (×2): 25 ug via INTRAVENOUS
  Administered 2023-09-20: 50 ug via INTRAVENOUS

## 2023-09-20 MED ORDER — CHLORHEXIDINE GLUCONATE CLOTH 2 % EX PADS: 6.0000 | MEDICATED_PAD | Freq: Once | CUTANEOUS | Status: DC

## 2023-09-20 MED ORDER — CHLORHEXIDINE GLUCONATE 0.12 % MT SOLN
15.0000 mL | Freq: Once | OROMUCOSAL | Status: AC
  Administered 2023-09-20: 15 mL via OROMUCOSAL

## 2023-09-20 MED ORDER — DEXAMETHASONE SODIUM PHOSPHATE 10 MG/ML IJ SOLN
INTRAMUSCULAR | Status: AC
  Filled 2023-09-20: qty 1

## 2023-09-20 MED ORDER — CEFAZOLIN SODIUM-DEXTROSE 2-4 GM/100ML-% IV SOLN
2.0000 g | INTRAVENOUS | Status: AC
  Administered 2023-09-20: 2 g via INTRAVENOUS
  Filled 2023-09-20: qty 100

## 2023-09-20 MED ORDER — FENTANYL CITRATE PF 50 MCG/ML IJ SOSY: 25.0000 ug | PREFILLED_SYRINGE | INTRAMUSCULAR | Status: DC | PRN

## 2023-09-20 MED ORDER — AMISULPRIDE (ANTIEMETIC) 5 MG/2ML IV SOLN
10.0000 mg | Freq: Once | INTRAVENOUS | Status: AC | PRN
  Administered 2023-09-20: 10 mg via INTRAVENOUS

## 2023-09-20 MED ORDER — 0.9 % SODIUM CHLORIDE (POUR BTL) OPTIME
TOPICAL | Status: DC | PRN
Start: 2023-09-20 — End: 2023-09-20
  Administered 2023-09-20: 1000 mL

## 2023-09-20 MED ORDER — PROPOFOL 10 MG/ML IV BOLUS
INTRAVENOUS | Status: DC | PRN
  Administered 2023-09-20: 80 mg via INTRAVENOUS

## 2023-09-20 MED ORDER — LIDOCAINE HCL (PF) 2 % IJ SOLN
INTRAMUSCULAR | Status: DC | PRN
  Administered 2023-09-20: 50 mg via INTRADERMAL

## 2023-09-20 MED ORDER — PHENYLEPHRINE 80 MCG/ML (10ML) SYRINGE FOR IV PUSH (FOR BLOOD PRESSURE SUPPORT)
PREFILLED_SYRINGE | INTRAVENOUS | Status: AC
  Filled 2023-09-20: qty 10

## 2023-09-20 MED ORDER — AMISULPRIDE (ANTIEMETIC) 5 MG/2ML IV SOLN
INTRAVENOUS | Status: AC
  Filled 2023-09-20: qty 4

## 2023-09-20 MED ORDER — PHENYLEPHRINE HCL (PRESSORS) 10 MG/ML IV SOLN
INTRAVENOUS | Status: AC
  Filled 2023-09-20: qty 1

## 2023-09-20 MED ORDER — ROCURONIUM BROMIDE 10 MG/ML (PF) SYRINGE
PREFILLED_SYRINGE | INTRAVENOUS | Status: AC
  Filled 2023-09-20: qty 10

## 2023-09-20 MED ORDER — OXYCODONE-ACETAMINOPHEN 5-325 MG PO TABS: 1.0000 | ORAL_TABLET | ORAL | 0 refills | Status: DC | PRN

## 2023-09-20 MED ORDER — OXYCODONE HCL 5 MG/5ML PO SOLN: 5.0000 mg | Freq: Once | ORAL | Status: DC | PRN

## 2023-09-20 MED ORDER — BUPIVACAINE-EPINEPHRINE (PF) 0.25% -1:200000 IJ SOLN
INTRAMUSCULAR | Status: AC
  Filled 2023-09-20: qty 30

## 2023-09-20 MED ORDER — LACTATED RINGERS IV SOLN: INTRAVENOUS | Status: DC | PRN

## 2023-09-20 MED ORDER — OXYCODONE HCL 5 MG PO TABS: 5.0000 mg | ORAL_TABLET | Freq: Once | ORAL | Status: DC | PRN

## 2023-09-20 MED ORDER — ACETAMINOPHEN 500 MG PO TABS
1000.0000 mg | ORAL_TABLET | ORAL | Status: AC
  Administered 2023-09-20: 1000 mg via ORAL
  Filled 2023-09-20: qty 2

## 2023-09-20 MED ORDER — ONDANSETRON HCL 4 MG/2ML IJ SOLN
INTRAMUSCULAR | Status: DC | PRN
  Administered 2023-09-20: 4 mg via INTRAVENOUS

## 2023-09-20 MED ORDER — HEPARIN SODIUM (PORCINE) 5000 UNIT/ML IJ SOLN
5000.0000 [IU] | Freq: Once | INTRAMUSCULAR | Status: AC
  Administered 2023-09-20: 5000 [IU] via SUBCUTANEOUS
  Filled 2023-09-20: qty 1

## 2023-09-20 MED ORDER — PHENYLEPHRINE 80 MCG/ML (10ML) SYRINGE FOR IV PUSH (FOR BLOOD PRESSURE SUPPORT)
PREFILLED_SYRINGE | INTRAVENOUS | Status: DC | PRN
Start: 2023-09-20 — End: 2023-09-20
  Administered 2023-09-20 (×2): 40 ug via INTRAVENOUS

## 2023-09-20 MED ORDER — DEXAMETHASONE SODIUM PHOSPHATE 10 MG/ML IJ SOLN
INTRAMUSCULAR | Status: DC | PRN
  Administered 2023-09-20: 8 mg via INTRAVENOUS

## 2023-09-20 MED ORDER — SUGAMMADEX SODIUM 200 MG/2ML IV SOLN
INTRAVENOUS | Status: DC | PRN
  Administered 2023-09-20: 160 mg via INTRAVENOUS

## 2023-09-20 SURGICAL SUPPLY — 22 items
COTTONBALL LRG STERILE PKG (GAUZE/BANDAGES/DRESSINGS) ×1 IMPLANT
COVER SURGICAL LIGHT HANDLE (MISCELLANEOUS) ×1 IMPLANT
DERMABOND ADVANCED .7 DNX12 (GAUZE/BANDAGES/DRESSINGS) ×1 IMPLANT
DRAPE LAPAROTOMY T 98X78 PEDS (DRAPES) ×1 IMPLANT
DRSG TEGADERM 4X4.75 (GAUZE/BANDAGES/DRESSINGS) ×1 IMPLANT
ELECT REM PT RETURN 15FT ADLT (MISCELLANEOUS) ×1 IMPLANT
GLOVE BIO SURGEON STRL SZ7.5 (GLOVE) ×1 IMPLANT
GLOVE INDICATOR 8.0 STRL GRN (GLOVE) ×1 IMPLANT
GOWN STRL REUS W/ TWL XL LVL3 (GOWN DISPOSABLE) ×1 IMPLANT
KIT BASIN OR (CUSTOM PROCEDURE TRAY) ×1 IMPLANT
KIT TURNOVER KIT A (KITS) ×1 IMPLANT
NDL HYPO 20X1 EYE RM 11 (NEEDLE) ×1 IMPLANT
NEEDLE HYPO 20X1 EYE RM 11 (NEEDLE) ×1 IMPLANT
NS IRRIG 1000ML POUR BTL (IV SOLUTION) ×1 IMPLANT
PACK GENERAL/GYN (CUSTOM PROCEDURE TRAY) ×1 IMPLANT
SPIKE FLUID TRANSFER (MISCELLANEOUS) ×1 IMPLANT
SUT MNCRL AB 4-0 PS2 18 (SUTURE) ×1 IMPLANT
SUT NOVA NAB GS-21 0 18 T12 DT (SUTURE) IMPLANT
SUT PDS AB 2-0 CT2 27 (SUTURE) IMPLANT
SUT VIC AB 3-0 SH 8-18 (SUTURE) ×1 IMPLANT
SYR CONTROL 10ML LL (SYRINGE) ×1 IMPLANT
TOWEL OR 17X26 10 PK STRL BLUE (TOWEL DISPOSABLE) ×1 IMPLANT

## 2023-09-20 NOTE — Anesthesia Postprocedure Evaluation (Signed)
 Anesthesia Post Note  Patient: Jessica [name]  Procedure(s) Performed: REPAIR, HERNIA, UMBILICAL, ADULT     Patient location during evaluation: PACU Anesthesia Type: General Level of consciousness: awake and alert Pain management: pain level controlled Vital Signs Assessment: post-procedure vital signs reviewed and stable Respiratory status: spontaneous breathing, nonlabored ventilation, respiratory function stable and patient connected to nasal cannula oxygen Cardiovascular status: blood pressure returned to baseline and stable Postop Assessment: no apparent nausea or vomiting Anesthetic complications: no   No notable events documented.  Last Vitals:  Vitals:   09/20/23 1044 09/20/23 1045  BP: 131/72 129/72  Pulse: (!) 56   Resp: (!) 8   Temp: 36.6 C   SpO2: 93%     Last Pain:  Vitals:   09/20/23 1044  TempSrc:   PainSc: 0-No pain                 [name]

## 2023-09-20 NOTE — H&P (Signed)
 Admitting Physician: Deward PARAS Bengie Kaucher  Service: General Surgery  CC: Umbilical hernia  Subjective   HPI: Jessica [name] is an 83 y.o. female who is here for umbilical hernia repair  Past Medical History:  Diagnosis Date   Anemia 12/2015   Bronchitis, chronic (HCC)    in her 10s   Cancer (HCC)    breast   Cataract    Chronic kidney disease    in 20s had nephritis   Closed fracture of right distal humerus    right   Coronary artery disease    Elevated Lp(a)    Hand, foot and mouth disease (HFMD)    Hearing loss 05/03/2016   High cholesterol    Hypothyroidism    Myocardial infarction (HCC)    Neuropathy    toes   Osteopenia    Pericarditis 01/1999   Pneumothorax 01/1999   Thyroid  disorder    Vitamin B 12 deficiency     Past Surgical History:  Procedure Laterality Date   birthmark      removed,left leg   BREAST BIOPSY Left 09/13/2022   US  LT BREAST BX W LOC DEV 1ST LESION IMG BX SPEC US  GUIDE 09/13/2022 [facility] MAMMOGRAPHY   CARDIAC CATHETERIZATION     CATARACT EXTRACTION  2017   CORONARY STENT INTERVENTION N/A 09/01/2020   Procedure: CORONARY STENT INTERVENTION;  Surgeon: Claudene Victory ORN, MD;  [facility];  Service: Cardiovascular;  Laterality: N/A;   LEFT HEART CATH AND CORONARY ANGIOGRAPHY N/A 09/01/2020   Procedure: LEFT HEART CATH AND CORONARY ANGIOGRAPHY;  Surgeon: Claudene Victory ORN, MD;  [facility];  Service: Cardiovascular;  Laterality: N/A;   ORIF HUMERUS FRACTURE Right 04/10/2019   Procedure: RIGHT OPEN REDUCTION INTERNAL FIXATION (ORIF) DISTAL HUMERUS FRACTURE WITH EXTENSION;  Surgeon: Cristy Bonner DASEN, MD;  Location: [facility];  Service: Orthopedics;  Laterality: Right;   PORTACATH PLACEMENT N/A 11/15/2022   Procedure: PORT PLACEMENT WITH ULTRASOUND GUIDANCE;  Surgeon: Vanderbilt Ned, MD;  [facility];  Service: General;  Laterality: N/A;   SIMPLE MASTECTOMY WITH AXILLARY SENTINEL NODE BIOPSY Bilateral  10/18/2022   Procedure: BILATERAL SIMPLE MASTECTOMY;  Surgeon: Vanderbilt Ned, MD;  Location: [facility];  Service: General;  Laterality: Bilateral;  PEC BLOCK   TONSILLECTOMY AND ADENOIDECTOMY  1952?    Family History  Problem Relation Age of Onset   Diabetes Mother    Asthma Mother    Ataxia Father    Stroke Father    Cancer - Cervical Sister    Thyroid  disease Sister    Diabetes Maternal Grandmother    Arthritis Paternal Grandmother    Diabetes Other    Colon cancer Neg Hx    Breast cancer Neg Hx     Social:  reports that she has never smoked. She has never used smokeless tobacco. She reports that she does not currently use alcohol. She reports that she does not use drugs.  Allergies: No Known Allergies  Medications: Current Outpatient Medications  Medication Instructions   acetaminophen  (TYLENOL ) 500-1,000 mg, Every 6 hours PRN   ascorbic acid  (VITAMIN C) 500 mg, Daily PRN   Calcium  Carbonate-Vitamin D  (CALCIUM  600+D PO) 1 tablet, Every morning   carboxymethylcellulose (REFRESH PLUS) 0.5 % SOLN 1 drop, 3 times daily PRN   cholecalciferol  (VITAMIN D3) 1,000 Units, Every morning   clobetasol ointment (TEMOVATE) 0.05 % 1 Application, 4D (96 hours )   clopidogrel  (PLAVIX ) 75 mg, Oral, Daily  denosumab  (PROLIA ) 60 mg, Every 6 months   ferrous sulfate 325 mg, Every evening   gabapentin  (NEURONTIN ) 100 mg, Oral, Daily   levothyroxine  (SYNTHROID ) 112 mcg, Oral, Daily   loratadine  (CLARITIN ) 10 mg, As needed   melatonin 5 mg, Daily at bedtime   Multiple Vitamin (MULTIVITAMIN WITH MINERALS) TABS tablet 1 tablet, Every evening   nitroGLYCERIN  (NITROSTAT ) 0.4 MG SL tablet DISSOLVE 1 TAB UNDER TONGUE FOR CHEST PAIN - IF PAIN REMAINS AFTER 5 MIN, CALL 911 AND REPEAT DOSE. MAX 3 TABS IN 15 MINUTES   [name] olpasiran (AMG 890) or placebo 142 mg,  Once   rosuvastatin  (CRESTOR ) 20 mg, Oral, Daily    ROS - all of the below systems have been reviewed with the patient  and positives are indicated with bold text General: chills, fever or night sweats Eyes: blurry vision or double vision ENT: epistaxis or sore throat Allergy/Immunology: itchy/watery eyes or nasal congestion Hematologic/Lymphatic: bleeding problems, blood clots or swollen lymph nodes Endocrine: temperature intolerance or unexpected weight changes Breast: new or changing breast lumps or nipple discharge Resp: cough, shortness of breath, or wheezing CV: chest pain or dyspnea on exertion GI: as per HPI GU: dysuria, trouble voiding, or hematuria MSK: joint pain or joint stiffness Neuro: TIA or stroke symptoms Derm: pruritus and skin lesion changes Psych: anxiety and depression  Objective   PE Blood pressure (!) 149/77, pulse (!) 57, temperature 97.6 F (36.4 C), temperature source Oral, resp. rate 16, height 5' 3 (1.6 m), weight 69.9 kg, SpO2 96%. Constitutional: NAD; conversant; no deformities Eyes: Moist conjunctiva; no lid lag; anicteric; PERRL Neck: Trachea midline; no thyromegaly Lungs: Normal respiratory effort; no tactile fremitus CV: RRR; no palpable thrills; no pitting edema GI: Abd umbilical hernia; 1 cm x 1 cm fascial defect on exam, no palpable hepatosplenomegaly MSK: Normal range of motion of extremities; no clubbing/cyanosis Psychiatric: Appropriate affect; alert and oriented x3 Lymphatic: No palpable cervical or axillary lymphadenopathy  No results found for this or any previous visit (from the past 24 hours).  Imaging Orders  No imaging studies ordered today     Assessment and Plan   Jessica [name] is an 83 y.o. female with an umbilical hernia.  1 cm x 1 cm fascial defect on exam   I recommended open umbilical hernia repair possibly with mesh. We discussed the procedure itself as well as its risk, benefits, and alternatives. After full discussion all questions answered the patient granted consent to proceed.    Deward JINNY Foy, MD  [facility] Use [email or web address] to contact on call provider

## 2023-09-20 NOTE — Op Note (Signed)
   Patient: Jessica [name] (04-Oct-1940, [id number])  Date of Surgery: 09/20/2023  Preoperative Diagnosis: UMBILICAL HERNIA (1cm x 1cm based on preoperative exam)  Postoperative Diagnosis: UMBILICAL HERNIA (1cm x 1cm)  Surgical Procedure: Primary Umbilical Hernia Repair using 0 Novafil suture   Operative Team Members:  Surgeons and Role:    * Allysha Tryon, Deward PARAS, MD - Primary   Mae Flock, MD - [facility] Resident Assistant  Anesthesiologist: Niels Marien CROME, MD CRNA: Obadiah Reyes BROCKS, CRNA   Anesthesia: General   Fluids:  No intake/output data recorded.  Complications: * No complications entered in OR log *  Drains:  none   Specimen: * No specimens in log *   Disposition:  PACU - hemodynamically stable.  Plan of Care: Discharge to home after PACU    Indications for Procedure: [name] is a 83 y.o. female who presented with umbilical hernia.  It measured 1cm x 1cm on preoperative exam.    I recommended open umbilical hernia repair possibly with mesh. We discussed the procedure itself as well as its risk, benefits, and alternatives. After full discussion all questions answered the patient granted consent to proceed.    Findings:  Hernia Location: Umbilical Hernia Size:  1 cm x 1 cm  Mesh Size &Type:  None  Description of Procedure:  The patient was positioned supine, padded and secured to the bed.  The abdomen was widely prepped and draped.  A time out procedure was performed.    A curvilinear incision was made above the umbilicus and dissection was carried down through the subcutaneous tissue to the level of the fascia.  The umbilical stalk was encircled and the hernia sac amputated off the umbilical skin.  The hernia defect was measured as 1 cm wide by 1 cm in vertical dimension.     A sutured umbilical hernia repair was performed utilizing figure of eight 0 Novafil suture. The defect was closed horizontally under no tension.  The wound was irrigated  with saline.  The umbilical skin was tacked down to the fascial repair with 4-0 Monocryl suture.  The skin was closed with 4-0 Monocryl subcuticular suture and skin glue.     Deward Foy, MD General, Bariatric, & Minimally Invasive Surgery [facility], [place]

## 2023-09-20 NOTE — Anesthesia Procedure Notes (Signed)
 Procedure Name: Intubation Date/Time: 09/20/2023 8:43 AM  Performed by: Obadiah Reyes BROCKS, CRNAPre-anesthesia Checklist: Patient identified, Emergency Drugs available, Suction available and Patient being monitored Patient Re-evaluated:Patient Re-evaluated prior to induction Oxygen Delivery Method: Circle System Utilized Preoxygenation: Pre-oxygenation with 100% oxygen Induction Type: IV induction Ventilation: Mask ventilation without difficulty Laryngoscope Size: Miller and 2 Grade View: Grade II Tube type: Oral Number of attempts: 1 Airway Equipment and Method: Stylet and Oral airway Placement Confirmation: ETT inserted through vocal cords under direct vision, positive ETCO2 and breath sounds checked- equal and bilateral Secured at: 22 cm Tube secured with: Tape Dental Injury: Teeth and Oropharynx as per pre-operative assessment

## 2023-09-20 NOTE — Discharge Instructions (Signed)
 VENTRAL HERNIA REPAIR POST OPERATIVE INSTRUCTIONS  Thinking Clearly  The anesthesia may cause you to feel different for 1 or 2 days. Do not drive, drink alcohol, or make any big decisions for at least 2 days.  Nutrition When you wake up, you will be able to drink small amounts of liquid. If you do not feel sick, you can slowly advance your diet to regular foods. Continue to drink lots of fluids, usually about 8 to 10 glasses per day. Eat a high-fiber diet so you don't strain during bowel movements. High-Fiber Foods Foods high in fiber include beans, bran cereals and whole-grain breads, peas, dried fruit (figs, apricots, and dates), raspberries, blackberries, strawberries, sweet corn, broccoli, baked potatoes with skin, plums, pears, apples, greens, and nuts. Activity Slowly increase your activity. Be sure to get up and walk every hour or so to prevent blood clots. No heavy lifting or strenuous activity for 4 weeks following surgery to prevent hernias at your incision sites or recurrence of your hernia. It is normal to feel tired. You may need more sleep than usual.  Get your rest but make sure to get up and move around frequently to prevent blood clots and pneumonia.  Work and Return to [employer] can go back to work when you feel well enough. Discuss the timing with your surgeon. You can usually go back to school or work 1 week or less after an laparoscopic or an open repair. If your work requires heavy lifting or strenuous activity you need to be placed on light duty for 4 weeks following surgery. You can return to gym class, sports or other physical activities 4 weeks after surgery.  Wound Care You may experience significant bruising throughout the abdominal wall that may track down into the groin including into the scrotum in males.  Rest, elevating the groin and scrotum above the level of the heart, ice and compression with tight fitting underwear or an abdominal binder can help.   Always wash your hands before and after touching near your incision site. Do not soak in a bathtub until cleared at your follow up appointment. You may take a shower 24 hours after surgery. A small amount of drainage from the incision is normal. If the drainage is thick and yellow or the site is red, you may have an infection, so call your surgeon. If you have a drain in one of your incisions, it will be taken out in office when the drainage stops. Steri-Strips will fall off in 7 to 10 days or they will be removed during your first office visit. If you have dermabond glue covering over the incision, allow the glue to flake off on its own. Protect the new skin, especially from the sun. The sun can burn and cause darker scarring. Your scar will heal in about 4 to 6 weeks and will become softer and continue to fade over the next year.  The cosmetic appearance of the incisions will improve over the course of the first year after surgery. Sensation around your incision will return in a few weeks or months.  Bowel Movements After intestinal surgery, you may have loose watery stools for several days. If watery diarrhea lasts longer than 3 days, contact your surgeon. Pain medication (narcotics) can cause constipation. Increase the fiber in your diet with high-fiber foods if you are constipated. You can take an over the counter stool softener like Colace to avoid constipation.  Additional over the counter medications can also be used  if Colace isn't sufficient (for example, Milk of Magnesia or Miralax).  Pain The amount of pain is different for each person. Some people need only 1 to 3 doses of pain control medication, while others need more. Take alternating doses of tylenol and ibuprofen around the clock for the first five days following surgery.  This will provide a baseline of pain control and help with inflammation.  Take the narcotic pain medication in addition if needed for severe pain.  Contact  Your Surgeon at [phone], if you have: Pain that will not go away Pain that gets worse A fever of more than 101F (38.3C) Repeated vomiting Swelling, redness, bleeding, or bad-smelling drainage from your wound site Strong abdominal pain No bowel movement or unable to pass gas for 3 days Watery diarrhea lasting longer than 3 days  Pain Control The goal of pain control is to minimize pain, keep you moving and help you heal. Your surgical team will work with you on your pain plan. Most often a combination of therapies and medications are used to control your pain. You may also be given medication (local anesthetic) at the surgical site. This may help control your pain for several days. Extreme pain puts extra stress on your body at a time when your body needs to focus on healing. Do not wait until your pain has reached a level "10" or is unbearable before telling your doctor or nurse. It is much easier to control pain before it becomes severe. Following a laparoscopic procedure, pain is sometimes felt in the shoulder. This is due to the gas inserted into your abdomen during the procedure. Moving and walking helps to decrease the gas and the right shoulder pain.  Use the guide below for ways to manage your post-operative pain. Learn more by going to facs.org/safepaincontrol.  How Intense Is My Pain Common Therapies to Feel Better       I hardly notice my pain, and it does not interfere with my activities.  I notice my pain and it distracts me, but I can still do activities (sitting up, walking, standing).  Non-Medication Therapies  Ice (in a bag, applied over clothing at the surgical site), elevation, rest, meditation, massage, distraction (music, TV, play) walking and mild exercise Splinting the abdomen with pillows +  Non-Opioid Medications Acetaminophen (Tylenol) Non-steroidal anti-inflammatory drugs (NSAIDS) Aspirin, Ibuprofen (Motrin, Advil) Naproxen (Aleve) Take these as  needed, when you feel pain. Both acetaminophen and NSAIDs help to decrease pain and swelling (inflammation).      My pain is hard to ignore and is more noticeable even when I rest.  My pain interferes with my usual activities.  Non-Medication Therapies  +  Non-Opioid medications  Take on a regular schedule (around-the-clock) instead of as needed. (For example, Tylenol every 6 hours at 9:00 am, 3:00 pm, 9:00 pm, 3:00 am and Motrin every 6 hours at 12:00 am, 6:00 am, 12:00 pm, 6:00 pm)         I am focused on my pain, and I am not doing my daily activities.  I am groaning in pain, and I cannot sleep. I am unable to do anything.  My pain is as bad as it could be, and nothing else matters.  Non-Medication Therapies  +  Around-the-Clock Non-Opioid Medications  +  Short-acting opioids  Opioids should be used with other medications to manage severe pain. Opioids block pain and give a feeling of euphoria (feel high). Addiction, a serious side effect of opioids, is  rare with short-term (a few days) use.  Examples of short-acting opioids include: Tramadol (Ultram), Hydrocodone (Norco, Vicodin), Hydromorphone (Dilaudid), Oxycodone (Oxycontin)     The above directions have been adapted from the [employer] Surgical Patient Education Program.  Please refer to the ACS website if needed: http://kaiser.net/   Ivar Drape, MD [facility], [place] [address], [address], [place], [place]  [address] ?  [address] [address], [place], [place]   [address] [phone] ? [phone] ? FAX [phone] Web site: [email or web address]

## 2023-09-20 NOTE — Transfer of Care (Signed)
 Immediate Anesthesia Transfer of Care Note  Patient: Jessica [name]  Procedure(s) Performed: REPAIR, HERNIA, UMBILICAL, ADULT  Patient Location: PACU  Anesthesia Type:General  Level of Consciousness: sedated and responds to stimulation  Airway & Oxygen Therapy: Patient Spontanous Breathing and Patient connected to face mask oxygen  Post-op Assessment: Report given to RN and Post -op Vital signs reviewed and stable  Post vital signs: Reviewed and stable  Last Vitals:  Vitals Value Taken Time  BP 114/71 09/20/23 09:22  Temp    Pulse 57 09/20/23 09:27  Resp 8 09/20/23 09:27  SpO2 99 % 09/20/23 09:27  Vitals shown include unfiled device data.  Last Pain:  Vitals:   09/20/23 0702  TempSrc:   PainSc: 0-No pain         Complications: No notable events documented.

## 2023-09-21 ENCOUNTER — Encounter (HOSPITAL_COMMUNITY): Payer: Self-pay | Admitting: Surgery

## 2023-09-23 ENCOUNTER — Encounter: Payer: Self-pay | Admitting: Physical Therapy

## 2023-09-25 ENCOUNTER — Encounter: Admitting: *Deleted

## 2023-09-25 DIAGNOSIS — Z006 Encounter for examination for normal comparison and control in clinical research program: Secondary | ICD-10-CM

## 2023-09-25 MED ORDER — STUDY - OCEAN(A) - OLPASIRAN (AMG 890) 142 MG/ML OR PLACEBO SQ INJECTION (PI-HILTY)
142.0000 mg | PREFILLED_SYRINGE | Freq: Once | SUBCUTANEOUS | Status: AC
  Administered 2023-09-25: 142 mg via SUBCUTANEOUS
  Filled 2023-09-25: qty 1

## 2023-09-25 NOTE — Research (Signed)
 [name]  Week 120  Vitals: [x]  Med reviews: [x]  Labs: (week 24): [x]    0955 Experience any AE/SAE/Hospitalizations []  Yes [x]  No  If yes please explain:   Discussed with patient about the importance of not letting any one draw cholesterol or lipids. As to we are blinded to results. Reassured patient if we needed to be contacted the study team would reach out to our unblinded person.   ~no labs next visit 132 ~  Non-Fatal Potential Endpoint Assessment Yes  No   Has the subject experienced/undergone any of the following since the last visit/contact?   []   [x]    Any Coronary Artery Revascularization/Cerebrovascular Revascularization/ Peripheral Artery Revascularization/Amputation Procedure   []   [x]    Myocardial Infarction []   [x]    Stroke   []   [x]    Provide the date for the non-fatal Potential Endpoints status as of today visit date    []   [x]     IP admin please see MAR (please add Box # to comment section on MAR) [x]     Current Outpatient Medications:    acetaminophen  (TYLENOL ) 500 MG tablet, Take 500-1,000 mg by mouth every 6 (six) hours as needed (pain.)., Disp: , Rfl:    ascorbic acid  (VITAMIN C) 500 MG tablet, Take 500 mg by mouth daily as needed (immune support (sore throat))., Disp: , Rfl:    Calcium  Carbonate-Vitamin D  (CALCIUM  600+D PO), Take 1 tablet by mouth in the morning., Disp: , Rfl:    carboxymethylcellulose (REFRESH PLUS) 0.5 % SOLN, Place 1 drop into both eyes 3 (three) times daily as needed (dry/irritated eyes.)., Disp: , Rfl:    cholecalciferol  (VITAMIN D3) 25 MCG (1000 UNIT) tablet, Take 1,000 Units by mouth in the morning., Disp: , Rfl:    clobetasol ointment (TEMOVATE) 0.05 %, Apply 1 Application topically 4 days., Disp: , Rfl:    clopidogrel  (PLAVIX ) 75 MG tablet, TAKE 1 TABLET BY MOUTH DAILY, Disp: 90 tablet, Rfl: 3   denosumab  (PROLIA ) 60 MG/ML SOSY injection, Inject 60 mg into the skin every 6 (six) months. Administered at [facility] q82m, Disp:  , Rfl:    ferrous sulfate 325 (65 FE) MG tablet, Take 325 mg by mouth every evening., Disp: , Rfl:    gabapentin  (NEURONTIN ) 100 MG capsule, TAKE 1 CAPSULE BY MOUTH DAILY, Disp: 90 capsule, Rfl: 1   levothyroxine  (SYNTHROID ) 112 MCG tablet, TAKE 1 TABLET BY MOUTH DAILY, Disp: 90 tablet, Rfl: 3   loratadine  (CLARITIN ) 10 MG tablet, Take 10 mg by mouth as needed for allergies., Disp: , Rfl:    melatonin 5 MG TABS, Take 5 mg by mouth at bedtime., Disp: , Rfl:    Multiple Vitamin (MULTIVITAMIN WITH MINERALS) TABS tablet, Take 1 tablet by mouth every evening., Disp: , Rfl:    nitroGLYCERIN  (NITROSTAT ) 0.4 MG SL tablet, DISSOLVE 1 TAB UNDER TONGUE FOR CHEST PAIN - IF PAIN REMAINS AFTER 5 MIN, CALL 911 AND REPEAT DOSE. MAX 3 TABS IN 15 MINUTES, Disp: 25 tablet, Rfl: 6   rosuvastatin  (CRESTOR ) 20 MG tablet, TAKE 1 TABLET BY MOUTH DAILY, Disp: 90 tablet, Rfl: 3   oxyCODONE -acetaminophen  (PERCOCET) 5-325 MG tablet, Take 1 tablet by mouth every 4 (four) hours as needed for severe pain (pain score 7-10). (Patient not taking: Reported on 09/25/2023), Disp: 20 tablet, Rfl: 0   Study - [name] - olpasiran (AMG 890) 142 mg/mL or placebo SQ injection (PI-Hilty), Inject 142 mg into the skin once. For investigational use only - Inject 1  mL (1 prefilled syringe) subcutaneously into appropriate injection site per protocol. (Approved injection site(s): upper arm, upper thigh, abdomen). Please contact [facility] if any questions., Disp: , Rfl:   Current Facility-Administered Medications:    [START ON 11/13/2023] denosumab  (PROLIA ) injection 60 mg, 60 mg, Subcutaneous, Q6 months, Charlanne, Anjali L, MD   Study - [name] - olpasiran (AMG 890) 142 mg/mL or placebo SQ injection (PI-Hilty), 142 mg, Subcutaneous, Once,

## 2023-09-25 NOTE — Research (Signed)
 [name] Informed Consent   Subject Name: Jessica [name]  Subject met inclusion and exclusion criteria.  The informed consent form, study requirements and expectations were reviewed with the subject and questions and concerns were addressed prior to the signing of the consent form.  The subject verbalized understanding of the trial requirements.  The subject agreed to participate in the [place] A trial and signed the informed consent on 09/25/2023.  The informed consent was obtained prior to performance of any protocol-specific procedures for the subject.  A copy of the signed informed consent was given to the subject and a copy was placed in the subject's medical record.     Subject re-consented to  Version: 7 IRB approved: [id number]  NORITA IHA D

## 2023-09-26 ENCOUNTER — Ambulatory Visit: Admitting: Physical Therapy

## 2023-09-28 ENCOUNTER — Encounter: Admitting: Physical Therapy

## 2023-09-29 NOTE — Research (Addendum)
 Are there any labs that are clinically significant?  Yes []  OR No[x]   Please FORWARD back to me with any changes or follow up!   Jessica KYM Maxcy, MD, [facility], FNLA, FACP  [place]  [facility]  Medical Director of the [facility] Lipid [facility] &  Cardiovascular Risk Reduction Clinic Diplomate of the American Board of Clinical Lipidology Attending Cardiologist  Direct Dial: [phone]  Fax: [phone]  Website:  [email or web address]   ACCESSION NO. [id number]                                             Page 1 of 1                                                        INVESTIGATOR: ([id number])                          PROTOCOL   [id number]                     Jessica [name] M.D.                             INVESTIGATOR NO.: [id number]                     c/o [name]                              SUBJECT ID: [id number]                     [facility]                 SUBJECT INITIALS NOT COLLECTED:                     [address] [place] [address]                 VISIT: [place], [place]  [id number]                   SPONSOR REPORT TO:                 COLLECTION TIME:09:55 DATE:25-Sep-2023                     [name]                      DATE RECEIVED IN LABORATORY: 26-Sep-2023                     c/o Sponsor(or Addtl) [employer].Study DATE REPORTED BY LABORATORY: 26-Sep-2023                     [facility]                          SEX:  F  BIRTHDATE:  04-Sep-1940    AGE: 83y                     [address]                  SELMA NO.: [id number]                   [place], [place]  [phone]                                                                                        Ref. Ranges               Clinical    Comments                                                                          Significance                                                                            Yes*  No                    SM [id number]  ANTIBODY COLL D/T                      CDate PreD     25-Sep-2023                                               CTime PreD     09:55                                                    ANION GAP                      Anion Gap      17           7-18 mEq/L                         EGFR                      CKDEPI eGF     88           mL/min/1.2m2  No Ref Rng                      CHEMISTRY PANEL                      Total Bili     0.4          0.2-1.2 mg/dL                                D-BilGen2      0.16         0.00-0.36 mg/dL                              Ind Bili       0.2          0.0-1.2 mg/dL                                Alk Phos       57           35-104 U/L                                   ALT (SGPT)     15           4-43 U/L                                    AST (SGOT)     19           8-40 U/L                                    Urea Nitr      15           4-34 mg/dL                                   Creatinine     0.66         0.35-1.45 mg/dL                              Calcium         8.9          8.3-10.6 mg/dL                              Total Prot     6.0          6.0-8.0 g/dL                                 Alb BCG        4.1          3.0-4.6 g/dL  CK             29           26-192 U/L                                   Sodium         140          135-145 mEq/L                                Potassium      4.1          3.5-5.2 mEq/L                                Bicarb         20.8         19.3-29.3 mEq/L                              Chloride       106          94-112 mEq/L                               ADJUSTED CALCIUM                       Adj Calc       8.9          8.3-10.6 mg/dL                     HEMATOLOGY&DIFFERENTIAL PANEL                      HGB            13.2         11.5-15.8 g/dL                              HCT            38           34-48 %                                       RBC            3.9          3.9-5.5 x106/uL                              MCH            34           26-34 pg                                    MCHC           35           31-38 g/dL  RDW            13.6         12.0-15.0 %                                 RBC Morph      No Review Required                                       MCV            99           80-100 fL                                    WBC            8.14         3.80-10.70 x103/uL                           Neutrophil     5.74         1.96-7.23 x103/uL                           Lymphocyte     1.65         0.80-3.00 x103/uL                           Monocytes      0.46         0.12-0.92 x103/uL                           Eosinophil     0.24         0.00-0.57 x103/uL                           Basophils      0.06         0.00-0.20 x103/uL                           Neutrophil     70.5         40.5-75.0 %                                 Lymphocyte     20.2         15.4-48.5%                                   Monocytes      5.6          2.6-10.1 %                                   Eosinophil     2.9          0.0-6.8 %  Basophils      0.8          0.0-2.0 %                                    Platelets      228          130-394 x103/uL                            ANC                      ANC            5.74         1.96-7.23 x103/uL                      ALT > 3 X ULN                      ALT>3XULN      Criteria not met                                        ALT > 5 X ULN                      ALT>5XULN      Criteria not met                                        ALT > 8 X ULN                      ALT>8XULN      Criteria not met                                        ALT & TBIL                      ALT & TBIL     Criteria not met                                        AST > 3 X ULN                      AST>3XULN      Criteria not met                                         AST > 5 X ULN                      AST>5XULN      Criteria not met  AST > 8 X ULN                      AST>8XULN      Criteria not met                                        AST & TBIL                      AST & TBIL     Criteria not met                     DECREASE >/= EGFR 50%                      eGFR > 50%     Criteria not met                   COAGULATION GROUP                      APTT           23.3         21.9-29.4 sec                                PT             9.8          9.7-12.3 sec                                 INR            0.9          Patient not taking                                                       oral anticoagulant:                                                      0.8 - 1.2                                                                Patient taking                                                          oral anticoagulant:  2.0 - 3.0                       ALT & INR                      ALT & INR      Criteria not met                                        AST & INR                      AST & INR      Criteria not met                        HBA1C                      Hgb A1c        5.4          <6.5%

## 2023-10-02 ENCOUNTER — Encounter: Admitting: Rehabilitation

## 2023-10-04 ENCOUNTER — Encounter: Admitting: Rehabilitation

## 2023-10-13 DIAGNOSIS — Z8719 Personal history of other diseases of the digestive system: Secondary | ICD-10-CM | POA: Diagnosis not present

## 2023-10-13 DIAGNOSIS — Z4889 Encounter for other specified surgical aftercare: Secondary | ICD-10-CM | POA: Diagnosis not present

## 2023-10-17 ENCOUNTER — Ambulatory Visit: Attending: Hematology and Oncology | Admitting: Physical Therapy

## 2023-10-17 DIAGNOSIS — M6281 Muscle weakness (generalized): Secondary | ICD-10-CM | POA: Diagnosis not present

## 2023-10-17 DIAGNOSIS — Z171 Estrogen receptor negative status [ER-]: Secondary | ICD-10-CM | POA: Diagnosis not present

## 2023-10-17 DIAGNOSIS — R262 Difficulty in walking, not elsewhere classified: Secondary | ICD-10-CM | POA: Diagnosis not present

## 2023-10-17 DIAGNOSIS — R293 Abnormal posture: Secondary | ICD-10-CM | POA: Insufficient documentation

## 2023-10-17 DIAGNOSIS — C50412 Malignant neoplasm of upper-outer quadrant of left female breast: Secondary | ICD-10-CM | POA: Diagnosis not present

## 2023-10-17 NOTE — Therapy (Addendum)
 OUTPATIENT PHYSICAL THERAPY ONCOLOGY TREATMENT  Patient Name: Jessica [name] MRN: [id number] DOB:05-Jun-1940, 83 y.o., female Today's Date: 10/17/2023  END OF SESSION:  PT End of Session - 10/17/23 1648     Visit Number 6    Number of Visits 14    Date for PT Re-Evaluation 11/14/23    PT Start Time 1600    PT Stop Time 1657    PT Time Calculation (min) 57 min    Activity Tolerance Patient tolerated treatment well    Behavior During Therapy [facility] for tasks assessed/performed             Past Medical History:  Diagnosis Date   Anemia 12/2015   Bronchitis, chronic (HCC)    in her 74s   Cancer (HCC)    breast   Cataract    Chronic kidney disease    in 20s had nephritis   Closed fracture of right distal humerus    right   Coronary artery disease    Elevated Lp(a)    Hand, foot and mouth disease (HFMD)    Hearing loss 05/03/2016   High cholesterol    Hypothyroidism    Myocardial infarction (HCC)    Neuropathy    toes   Osteopenia    Pericarditis 01/1999   Pneumothorax 01/1999   Thyroid  disorder    Vitamin B 12 deficiency    Past Surgical History:  Procedure Laterality Date   birthmark      removed,left leg   BREAST BIOPSY Left 09/13/2022   US  LT BREAST BX W LOC DEV 1ST LESION IMG BX SPEC US  GUIDE 09/13/2022 [facility] MAMMO[facility]APHY   CARDIAC CATHETERIZATION     CATARACT EXTRACTION  2017   CORONARY STENT INTERVENTION N/A 09/01/2020   Procedure: CORONARY STENT INTERVENTION;  Surgeon: Claudene Victory ORN, MD;  Location: [facility];  Service: Cardiovascular;  Laterality: N/A;   LEFT HEART CATH AND CORONARY ANGIOGRAPHY N/A 09/01/2020   Procedure: LEFT HEART CATH AND CORONARY ANGIOGRAPHY;  Surgeon: Claudene Victory ORN, MD;  Location: [facility];  Service: Cardiovascular;  Laterality: N/A;   ORIF HUMERUS FRACTURE Right 04/10/2019   Procedure: RIGHT OPEN REDUCTION INTERNAL FIXATION (ORIF) DISTAL HUMERUS FRACTURE WITH EXTENSION;  Surgeon: Cristy Bonner DASEN, MD;  Location:  [facility];  Service: Orthopedics;  Laterality: Right;   PORTACATH PLACEMENT N/A 11/15/2022   Procedure: PORT PLACEMENT WITH ULTRASOUND GUIDANCE;  Surgeon: Vanderbilt Ned, MD;  Location: [facility];  Service: General;  Laterality: N/A;   SIMPLE MASTECTOMY WITH AXILLARY SENTINEL NODE BIOPSY Bilateral 10/18/2022   Procedure: BILATERAL SIMPLE MASTECTOMY;  Surgeon: Vanderbilt Ned, MD;  Location: [facility];  Service: General;  Laterality: Bilateral;  PEC BLOCK   TONSILLECTOMY AND ADENOIDECTOMY  1952?   UMBILICAL HERNIA REPAIR N/A 09/20/2023   Procedure: REPAIR, HERNIA, UMBILICAL, ADULT;  Surgeon: Lyndel Deward PARAS, MD;  Location: [facility];  Service: General;  Laterality: N/A;   Patient Active Problem List   Diagnosis Date Noted   Blurry vision, bilateral 08/30/2023   Paresthesia 08/30/2023   Malignant neoplasm of upper-outer quadrant of left breast in female, estrogen receptor negative (HCC) 09/21/2022   Atherosclerotic heart disease of native coronary artery with other forms of angina pectoris (HCC) 04/08/2022   Swelling of left foot 04/07/2022   Left hand fracture, sequela 04/07/2022   Depression with anxiety 04/07/2022   NSTEMI (non-ST elevated myocardial infarction) (HCC) 09/01/2020   Closed fracture of right distal humerus 04/10/2019   Dyspnea 05/09/2018  Vitamin B 12 deficiency 05/09/2018   Cough 03/06/2018   Senile osteoporosis 11/20/2017   Iron deficiency 04/26/2017   Lichen sclerosus 04/26/2017   External hemorrhoid 04/26/2017   Weakness of left leg 06/07/2016   Balance disorder 05/24/2016   Memory deficit 05/24/2016   Anemia 05/19/2016   Hearing loss 05/03/2016   Hypothyroidism 07/24/2013   Bradycardia on ECG 07/24/2013   Numbness 08/07/2012   HYPERLIPIDEMIA 12/29/2006    PCP: Fredia Bring, MD  REFERRING PROVIDER: Odean Potts, MD  REFERRING DIAG: R26.89 (ICD-10-CM) - Balance problem  THERAPY DIAG:  Muscle weakness  (generalized)  Difficulty in walking, not elsewhere classified  Abnormal posture  Malignant neoplasm of upper-outer quadrant of left breast in female, estrogen receptor negative (HCC)  ONSET DATE: 10/18/22  Rationale for Evaluation and Treatment: Rehabilitation  SUBJECTIVE:                                                                                                                                                                                           SUBJECTIVE STATEMENT: The surgery was fine. I just feel like every time I have anaesthesia I have a harder time bouncing back. I feel weaker.   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 She underwent a bilateral mastectomy and left sentinel node biopsy (7 negative nodes) on 10/18/2022. It is triple negative with a Ki67 of 5%. She has some cardiac history and had an elbow fracture in 2022 resulting in surgery where pins were placed.   PAIN:  Are you having pain? No   PRECAUTIONS: Other: L UE lymphedema risk  WEIGHT BEARING RESTRICTIONS: No  FALLS:  Has patient fallen in last 6 months? No  LIVING ENVIRONMENT: Lives with: lives alone but at [facility] Lives in: House/apartment Stairs: No Has following equipment at home: Single point cane  OCCUPATION: retired  LEISURE: goes to water exercise 1x/wk, tries to do some walking  HAND DOMINANCE: right   PRIOR LEVEL OF FUNCTION: Independent  PATIENT GOALS: to become more stable, to have more strength   OBJECTIVE: Note: Objective measures were completed at Evaluation unless otherwise noted.  COGNITION: Overall cognitive status: Within functional limits for tasks assessed   POSTURE: forward head, rounded shoulders  LOWER EXTREMITY STRENGTH:   MMT Right Eval on 12/05/22 RIGHT  08/29/23 on EVAL RIGHT 10/17/23  Hip flexion 5/5 4/5 5/5  Hip extension 4/5 4+/5 5/5  Hip abduction 4/5 5/5 5/5  Hip  adduction      Hip internal rotation      Hip external rotation      Knee flexion  5/5 5/5 5/5  Knee extension 5/5 5/5 5/5  Ankle dorsiflexion 5/5 5/5   Ankle plantarflexion      Ankle inversion      Ankle eversion       (Blank rows = not tested)   A/PROM LEFT Eval on 12/05/22 LEFT 08/29/23 EVAL LEFT 10/17/23  Hip flexion 5/5 4/5 5/5  Hip extension 3/5 4/5 3+/5  Hip abduction 4+/5 5/5 5/5  Hip adduction      Hip internal rotation      Hip external rotation      Knee flexion 5/5 5/5 5/5  Knee extension 5/5 5/5 5/5  Ankle dorsiflexion 5/5 5/5   Ankle plantarflexion      Ankle inversion      Ankle eversion       (Blank rows = not tested)   FUNCTIONAL TEST: 10/17/23: 30 sec sit to stand: 10 reps without use of UEs which is average for her age       4 square step test: 12.72 sec SLS on R: 10 sec, SLS on L: 5 sec        08/29/23: 30 SEC SIT TO STAND: 11 reps which is good for her age 43/25/24: 30 SEC SIT TO STAND: 13 reps which is below average for her age 70/24/25: BERG balance test: 48/56, anything less than 45 is a greater risk of falling             01/05/23: BERG Balance Test: 51/56, anything less than 45 is a greater risk of falling   GAIT: Distance walked: 20 feet Assistive device utilized: None Level of assistance: Modified independence Comments: decreased arm swing bilaterally, occasional weaving                                                                                                                            TREATMENT DATE:  10/17/23 Reassessed goals and did functional tests Scapular retraction on dual cable machine x 7 lbs x 2 sets of 10 reps with pt returning therapist demo, then push/pull x 3 lbs x 10 reps bilaterally with pt returning therapist demo (attempted 7lbs but was too much), ER returning therapist demo bilaterally 3 lbs, lat pull down x 7 lbs x 10 reps bilaterally 3 way shoulder raises with 2 lb weights with neck in retraction and axial extension  standing against wall with pillow behind head NuStep seat at 7, level 5 x 10 min 25 sec 561 steps   09/12/23:  NuStep seat at 7, level 5 x 10 min 25 sec 539 steps to warm up  Neuro Re Ed: In // bars:  Standing on blue oval air ex on R LE while doing 3 way hip with no resistance but focus on keeping balance with no HHA today or loss of balance x 10 bilaterally Mini squats x 15 reps on air ex pad with no use of // bars for balance Tandem walking on air ex x 6 reps with  very little use of finger tips on // bars for balance Hip machine x 45 lbs x 15 reps in direction of flexion, abduction and ER with pt returning therapist demo Leg press seat at 5 x 90 lbs x 20 reps reps Scapular retraction on dual cable machine x 7 lbs x 20 reps (attempted 10 lbs but it was too much) with pt returning therapist demo, then push/pull x 3 lbs x 20 reps bilaterally with pt returning therapist demo (attempted 7lbs but was too much), ER returning therapist demo bilaterally 3 lbs, shoulder extension x 3 lbs x 10 reps bilaterally  09/07/23:  NuStep seat at 8, level 5, steps 638 x 10 min 10 sec 438 steps to warm up  Neuro Re Ed: In // bars:  Standing on blue oval air ex on R LE while doing 3 way hip with no resistance but focus on keeping balance with no HHA today x 10 bilaterally Tandem walking on air ex x 6 reps with minimal use of // bars Step ups on 6 in step: step up/up then down/down then stepping up and over and then stepping backwards up and over all x 10 each Hip machine x 40 lbs x 10 reps in direction of flexion, abduction and ER with pt returning therapist demo Leg press seat at 5 x 90 lbs x 2 sets of 10 reps reps Standing with purple ball behind head doing chin tucks while performing 3 way shoulder with 1 lbs weight x 10 reps in each direction Scapular retraction on dual cable machine x 7 lbs x 10 reps with pt returning therapist demo, then push/pull x 3 lbs x 10 reps bilaterally with pt returning therapist  demo In supine over 1/2 foam roll: horizontal abduction x 10 to stretch chest, snow angels x 10 with pt very limited in ROM due to tightness, lying with arms outstretched x 3 min  09/05/23:  Neuro Re Ed: In // bars:  Standing on blue oval air ex on R LE while doing 3 way hip using 2 lb ankle weight on L LE to keep movement slow and controlled throughout with hand held assist to maintain balance x 10 reps in all directions then repeated on opposite side  Tandem walking on air ex x 6 reps  Step ups on 6 in step: step up/up then down/down then stepping up and over and then stepping backwards up and over all x 10 each NuStep seat at 8, level 4, steps 638 x 10 min 7 sec to warm up with v/c to avoid hip adduction while educating pt on awareness of posture and neck retraction exercise to help with posture Standing on air ex: mini squats x 10 with v/c to avoid hip adduction with no HHA Hip machine x 25 lbs x 10 reps in direction of flexion, abduction and ER with pt returning therapist demo Leg press seat at 5 x 70 lbs x 20 reps Standing with purple ball behind head doing chin tucks while performing 3 way shoulder with 1 lbs weight x 10 reps in each direction Scapular retraction on dual cable machine x 7 lbs x 10 reps with pt returning therapist demo In supine over 1/2 foam roll: horizontal abduction x 10 to stretch chest, snow angels x 10 with pt very limited in ROM due to tightness, lying with arms outstretched x 3 min  08/30/23: NuStep seat at 8, level 3, steps 658 for 10:13 to warm up with v/c to avoid hip adduction while  educating pt on awareness of posture and neck retraction exercise to help with posture Neuro Re Ed: In // bars:  Standing on blue oval air ex on R LE while doing 3 way hip on L LE to keep movement slow and controlled throughout with hand held assist to maintain balance x 10 reps in all directions then repeated on opposite side  Tandem walking on air ex x 6 reps  Step ups on 6 in step:  step up/up then down/down then stepping up and over and then stepping backwards up and over all x 10 each Standing on air ex: mini squats x 10 with v/c to avoid hip adduction Practicing ascending/descending 1 flight of steps first with 1 HHA then with no HHA with v/c on form and importance of activation of muscles in feet for balance 08/29/23- none today due to time constraints   PATIENT EDUCATION:  Education details: goals for therapy, how she is currently vs at d/c last episode Person educated: Patient Education method: Explanation Education comprehension: verbalized understanding  HOME EXERCISE PROGRAM: To be determined  ASSESSMENT:  CLINICAL IMPRESSION: Assessed pt's progress towards goals in therapy. She has met her hip flexion goal but her L hip extension is only 3/5 and she had pain in the hip when this was tested. She still feels unsafe when ambulating on curbs and steps. Her SLS is limited especially on L (5 sec). She continues to demonstrate poor posture characterized by forward head and rounded shoulders. She would benefit from additional skilled PT services to continue to improve balance, strength, posture to decrease fall risk.   OBJECTIVE IMPAIRMENTS: difficulty walking, decreased strength, increased fascial restrictions, impaired flexibility, and postural dysfunction.   ACTIVITY LIMITATIONS: squatting, stairs, bed mobility, and locomotion level  PARTICIPATION LIMITATIONS: shopping and community activity  PERSONAL FACTORS: Age, Fitness, and Time since onset of injury/illness/exacerbation are also affecting patient's functional outcome, visual disturbances.   REHAB POTENTIAL: Good  CLINICAL DECISION MAKING: Evolving/moderate complexity  EVALUATION COMPLEXITY: Moderate  GOALS: Goals reviewed with patient? Yes  SHORT TERM GOALS: Target date: 09/12/23  Pt will be independent with initial HEP for stretching and strengthening.  Baseline: Goal status: IN PROGRESS  2.   Pt will be able to complete 12 sit to stands in 30 sec without use of UEs to decrease fall risk.  Baseline:  Goal status: IN PROGRESS 10/17/23- pt is able to complete 10 in 30 sec  LONG TERM GOALS: Target date: 09/26/23  Pt will be able to complete 13 sit to stands in 30 sec without use of UEs to decrease fall risk. Baseline:  Goal status: IN PROGRESS 10/17/23- 10 reps  2.  Pt will demonstrate 5/5 bilateral hip flexor strength to decrease fall risk.  Baseline:  Goal status: MET 10/17/23 bilaterally  3.  Pt will score at least 51 on the BERG balance to decrease fall risk.  Baseline:  Goal status: IN PROGRESS 10/17/23  4.  Pt will report she is able to go down a flight a steps without fear of falling to decrease risk of falling.  Baseline:  Goal status: IN PROGRESS 10/17/23- still fearful of falling, pt still has decreased SLS time  5.  Pt will be independent in a final home exercise program for continued stretching and strengthening independently.  Baseline:  Goal status: IN PROGRESS   PLAN:  PT FREQUENCY: 2x/week  PT DURATION: 4 weeks  PLANNED INTERVENTIONS: [id number]- PT Re-evaluation, 97750- Physical Performance Testing, 97110-Therapeutic exercises, 97530- Therapeutic activity, [id number]- Neuromuscular  re-education, [phone]- Self Care, [id number]- Manual therapy, and [id number]- Gait training  PLAN FOR NEXT SESSION: continue to progress UE and LE strengthening exercises. work on [employer] and narrow base of support, hip strengthening, pec stretching, posture    [employer], PT 10/17/2023, 5:01 PM

## 2023-10-19 ENCOUNTER — Encounter: Payer: Self-pay | Admitting: Physical Therapy

## 2023-10-19 ENCOUNTER — Ambulatory Visit: Admitting: Physical Therapy

## 2023-10-19 DIAGNOSIS — M6281 Muscle weakness (generalized): Secondary | ICD-10-CM

## 2023-10-19 DIAGNOSIS — R293 Abnormal posture: Secondary | ICD-10-CM | POA: Diagnosis not present

## 2023-10-19 DIAGNOSIS — R262 Difficulty in walking, not elsewhere classified: Secondary | ICD-10-CM

## 2023-10-19 DIAGNOSIS — Z171 Estrogen receptor negative status [ER-]: Secondary | ICD-10-CM

## 2023-10-19 DIAGNOSIS — C50412 Malignant neoplasm of upper-outer quadrant of left female breast: Secondary | ICD-10-CM | POA: Diagnosis not present

## 2023-10-19 NOTE — Therapy (Signed)
 OUTPATIENT PHYSICAL THERAPY ONCOLOGY TREATMENT  Patient Name: Jessica [name] MRN: [id number] DOB:08-23-1940, 83 y.o., female Today's Date: 10/19/2023  END OF SESSION:  PT End of Session - 10/19/23 1301     Visit Number 7    Number of Visits 14    Date for PT Re-Evaluation 11/14/23    PT Start Time 1205    PT Stop Time 1253    PT Time Calculation (min) 48 min    Activity Tolerance Patient tolerated treatment well    Behavior During Therapy [facility] for tasks assessed/performed              Past Medical History:  Diagnosis Date   Anemia 12/2015   Bronchitis, chronic (HCC)    in her 79s   Cancer (HCC)    breast   Cataract    Chronic kidney disease    in 20s had nephritis   Closed fracture of right distal humerus    right   Coronary artery disease    Elevated Lp(a)    Hand, foot and mouth disease (HFMD)    Hearing loss 05/03/2016   High cholesterol    Hypothyroidism    Myocardial infarction (HCC)    Neuropathy    toes   Osteopenia    Pericarditis 01/1999   Pneumothorax 01/1999   Thyroid  disorder    Vitamin B 12 deficiency    Past Surgical History:  Procedure Laterality Date   birthmark      removed,left leg   BREAST BIOPSY Left 09/13/2022   US  LT BREAST BX W LOC DEV 1ST LESION IMG BX SPEC US  GUIDE 09/13/2022 [facility]AMMO[facility]AP[facility]   CARDIAC CATHETERIZATION     CATARACT EXTRACTION  2017   CORONARY STENT INTERVENTION N/A 09/01/2020   Procedure: CORONARY STENT INTERVENTION;  Surgeon: Claudene Victory ORN, MD;  Location: [facility];  Service: Cardiovascular;  Laterality: N/A;   LEFT HEART CATH AND CORONARY ANGIOGRAPHY N/A 09/01/2020   Procedure: LEFT HEART CATH AND CORONARY ANGIOGRAPHY;  Surgeon: Claudene Victory ORN, MD;  Location: [facility];  Service: Cardiovascular;  Laterality: N/A;   ORIF HUMERUS FRACTURE Right 04/10/2019   Procedure: RIGHT OPEN REDUCTION INTERNAL FIXATION (ORIF) DISTAL HUMERUS FRACTURE WITH EXTENSION;  Surgeon: Cristy Bonner DASEN, MD;  Location:  [facility];  Service: Orthopedics;  Laterality: Right;   PORTACATH PLACEMENT N/A 11/15/2022   Procedure: PORT PLACEMENT WITH ULTRASOUND GUIDANCE;  Surgeon: Vanderbilt Ned, MD;  Location: [facility];  Service: General;  Laterality: N/A;   SIMPLE MASTECTOMY WITH AXILLARY SENTINEL NODE BIOPSY Bilateral 10/18/2022   Procedure: BILATERAL SIMPLE MASTECTOMY;  Surgeon: Vanderbilt Ned, MD;  Location: [facility];  Service: General;  Laterality: Bilateral;  PEC BLOCK   TONSILLECTOMY AND ADENOIDECTOMY  1952?   UMBILICAL HERNIA REPAIR N/A 09/20/2023   Procedure: REPAIR, HERNIA, UMBILICAL, ADULT;  Surgeon: Lyndel Deward PARAS, MD;  Location: [facility];  Service: General;  Laterality: N/A;   Patient Active Problem List   Diagnosis Date Noted   Blurry vision, bilateral 08/30/2023   Paresthesia 08/30/2023   Malignant neoplasm of upper-outer quadrant of left breast in female, estrogen receptor negative (HCC) 09/21/2022   Atherosclerotic heart disease of native coronary artery with other forms of angina pectoris (HCC) 04/08/2022   Swelling of left foot 04/07/2022   Left hand fracture, sequela 04/07/2022   Depression with anxiety 04/07/2022   NSTEMI (non-ST elevated myocardial infarction) (HCC) 09/01/2020   Closed fracture of right distal humerus 04/10/2019   Dyspnea  05/09/2018   Vitamin B 12 deficiency 05/09/2018   Cough 03/06/2018   Senile osteoporosis 11/20/2017   Iron deficiency 04/26/2017   Lichen sclerosus 04/26/2017   External hemorrhoid 04/26/2017   Weakness of left leg 06/07/2016   Balance disorder 05/24/2016   Memory deficit 05/24/2016   Anemia 05/19/2016   Hearing loss 05/03/2016   Hypothyroidism 07/24/2013   Bradycardia on ECG 07/24/2013   Numbness 08/07/2012   HYPERLIPIDEMIA 12/29/2006    PCP: Fredia Bring, MD  REFERRING PROVIDER: Odean Potts, MD  REFERRING DIAG: R26.89 (ICD-10-CM) - Balance problem  THERAPY DIAG:  Muscle weakness  (generalized)  Difficulty in walking, not elsewhere classified  Abnormal posture  Malignant neoplasm of upper-outer quadrant of left breast in female, estrogen receptor negative (HCC)  ONSET DATE: 10/18/22  Rationale for Evaluation and Treatment: Rehabilitation  SUBJECTIVE:                                                                                                                                                                                           SUBJECTIVE STATEMENT: I think my hip was hurting because I was stretching too much at home.   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 She underwent a bilateral mastectomy and left sentinel node biopsy (7 negative nodes) on 10/18/2022. It is triple negative with a Ki67 of 5%. She has some cardiac history and had an elbow fracture in 2022 resulting in surgery where pins were placed.   PAIN:  Are you having pain? No   PRECAUTIONS: Other: L UE lymphedema risk  WEIGHT BEARING RESTRICTIONS: No  FALLS:  Has patient fallen in last 6 months? No  LIVING ENVIRONMENT: Lives with: lives alone but at [facility] Lives in: House/apartment Stairs: No Has following equipment at home: Single point cane  OCCUPATION: retired  LEISURE: goes to water exercise 1x/wk, tries to do some walking  HAND DOMINANCE: right   PRIOR LEVEL OF FUNCTION: Independent  PATIENT GOALS: to become more stable, to have more strength   OBJECTIVE: Note: Objective measures were completed at Evaluation unless otherwise noted.  COGNITION: Overall cognitive status: Within functional limits for tasks assessed   POSTURE: forward head, rounded shoulders  LOWER EXTREMITY STRENGTH:   MMT Right Eval on 12/05/22 RIGHT  08/29/23 on EVAL RIGHT 10/17/23  Hip flexion 5/5 4/5 5/5  Hip extension 4/5 4+/5 5/5  Hip abduction 4/5 5/5 5/5  Hip adduction      Hip internal rotation      Hip external  rotation      Knee flexion 5/5 5/5 5/5  Knee extension  5/5 5/5 5/5  Ankle dorsiflexion 5/5 5/5   Ankle plantarflexion      Ankle inversion      Ankle eversion       (Blank rows = not tested)   A/PROM LEFT Eval on 12/05/22 LEFT 08/29/23 EVAL LEFT 10/17/23  Hip flexion 5/5 4/5 5/5  Hip extension 3/5 4/5 3+/5  Hip abduction 4+/5 5/5 5/5  Hip adduction      Hip internal rotation      Hip external rotation      Knee flexion 5/5 5/5 5/5  Knee extension 5/5 5/5 5/5  Ankle dorsiflexion 5/5 5/5   Ankle plantarflexion      Ankle inversion      Ankle eversion       (Blank rows = not tested)   FUNCTIONAL TEST: 10/17/23: 30 sec sit to stand: 10 reps without use of UEs which is average for her age       4 square step test: 12.72 sec SLS on R: 10 sec, SLS on L: 5 sec        08/29/23: 30 SEC SIT TO STAND: 11 reps which is good for her age 72/25/24: 30 SEC SIT TO STAND: 13 reps which is below average for her age 27/24/25: BERG balance test: 48/56, anything less than 45 is a greater risk of falling             01/05/23: BERG Balance Test: 51/56, anything less than 45 is a greater risk of falling   GAIT: Distance walked: 20 feet Assistive device utilized: None Level of assistance: Modified independence Comments: decreased arm swing bilaterally, occasional weaving                                                                                                                            TREATMENT DATE:  10/19/23 NuStep seat at 7, level 5 x 10 min 25 sec 571 steps  Scapular retraction on dual cable machine x 7 lbs x 2 sets of 15 reps with pt returning therapist demo, then push/pull x 3 lbs x 10 reps bilaterally with pt returning therapist demo (attempted 7lbs but was too much), ER returning therapist demo bilaterally 3 lbs, lat pull down x 7 lbs x 15 reps x 2 sets bilaterally 3 way shoulder raises with 2 lb weights with neck in retraction and axial extension standing against wall with pillow  behind head with frequent v/c for posture Standing on 10 inch step slowly lowering leg as if to stand down x 10 bilaterally with pt having increased difficulty with this  10/17/23 Reassessed goals and did functional tests Scapular retraction on dual cable machine x 7 lbs x 2 sets of 10 reps with pt returning therapist demo, then push/pull x 3 lbs x 10 reps bilaterally with pt returning therapist demo (attempted 7lbs but was too much), ER returning therapist demo bilaterally 3 lbs, lat pull down x 7 lbs x 10 reps bilaterally 3 way  shoulder raises with 2 lb weights with neck in retraction and axial extension standing against wall with pillow behind head NuStep seat at 7, level 5 x 10 min 25 sec 561 steps   09/12/23:  NuStep seat at 7, level 5 x 10 min 25 sec 539 steps to warm up  Neuro Re Ed: In // bars:  Standing on blue oval air ex on R LE while doing 3 way hip with no resistance but focus on keeping balance with no HHA today or loss of balance x 10 bilaterally Mini squats x 15 reps on air ex pad with no use of // bars for balance Tandem walking on air ex x 6 reps with very little use of finger tips on // bars for balance Hip machine x 45 lbs x 15 reps in direction of flexion, abduction and ER with pt returning therapist demo Leg press seat at 5 x 90 lbs x 20 reps reps Scapular retraction on dual cable machine x 7 lbs x 20 reps (attempted 10 lbs but it was too much) with pt returning therapist demo, then push/pull x 3 lbs x 20 reps bilaterally with pt returning therapist demo (attempted 7lbs but was too much), ER returning therapist demo bilaterally 3 lbs, shoulder extension x 3 lbs x 10 reps bilaterally  09/07/23:  NuStep seat at 8, level 5, steps 638 x 10 min 10 sec 438 steps to warm up  Neuro Re Ed: In // bars:  Standing on blue oval air ex on R LE while doing 3 way hip with no resistance but focus on keeping balance with no HHA today x 10 bilaterally Tandem walking on air ex x 6 reps with  minimal use of // bars Step ups on 6 in step: step up/up then down/down then stepping up and over and then stepping backwards up and over all x 10 each Hip machine x 40 lbs x 10 reps in direction of flexion, abduction and ER with pt returning therapist demo Leg press seat at 5 x 90 lbs x 2 sets of 10 reps reps Standing with purple ball behind head doing chin tucks while performing 3 way shoulder with 1 lbs weight x 10 reps in each direction Scapular retraction on dual cable machine x 7 lbs x 10 reps with pt returning therapist demo, then push/pull x 3 lbs x 10 reps bilaterally with pt returning therapist demo In supine over 1/2 foam roll: horizontal abduction x 10 to stretch chest, snow angels x 10 with pt very limited in ROM due to tightness, lying with arms outstretched x 3 min  09/05/23:  Neuro Re Ed: In // bars:  Standing on blue oval air ex on R LE while doing 3 way hip using 2 lb ankle weight on L LE to keep movement slow and controlled throughout with hand held assist to maintain balance x 10 reps in all directions then repeated on opposite side  Tandem walking on air ex x 6 reps  Step ups on 6 in step: step up/up then down/down then stepping up and over and then stepping backwards up and over all x 10 each NuStep seat at 8, level 4, steps 638 x 10 min 7 sec to warm up with v/c to avoid hip adduction while educating pt on awareness of posture and neck retraction exercise to help with posture Standing on air ex: mini squats x 10 with v/c to avoid hip adduction with no HHA Hip machine x 25 lbs x 10  reps in direction of flexion, abduction and ER with pt returning therapist demo Leg press seat at 5 x 70 lbs x 20 reps Standing with purple ball behind head doing chin tucks while performing 3 way shoulder with 1 lbs weight x 10 reps in each direction Scapular retraction on dual cable machine x 7 lbs x 10 reps with pt returning therapist demo In supine over 1/2 foam roll: horizontal abduction x 10  to stretch chest, snow angels x 10 with pt very limited in ROM due to tightness, lying with arms outstretched x 3 min  08/30/23: NuStep seat at 8, level 3, steps 658 for 10:13 to warm up with v/c to avoid hip adduction while educating pt on awareness of posture and neck retraction exercise to help with posture Neuro Re Ed: In // bars:  Standing on blue oval air ex on R LE while doing 3 way hip on L LE to keep movement slow and controlled throughout with hand held assist to maintain balance x 10 reps in all directions then repeated on opposite side  Tandem walking on air ex x 6 reps  Step ups on 6 in step: step up/up then down/down then stepping up and over and then stepping backwards up and over all x 10 each Standing on air ex: mini squats x 10 with v/c to avoid hip adduction Practicing ascending/descending 1 flight of steps first with 1 HHA then with no HHA with v/c on form and importance of activation of muscles in feet for balance 08/29/23- none today due to time constraints   PATIENT EDUCATION:  Education details: goals for therapy, how she is currently vs at d/c last episode Person educated: Patient Education method: Explanation Education comprehension: verbalized understanding  HOME EXERCISE PROGRAM: To be determined  ASSESSMENT:  CLINICAL IMPRESSION: Continued with postural strengthening exercises today to help decrease forward head and rounded shoulders. Encouraged pt to bring photos of her gym where she lives so we can give her an HEP to incorporate the equipment she has available. Began working on eccentric strengthening of her quads to help with stair ambulation.   OBJECTIVE IMPAIRMENTS: difficulty walking, decreased strength, increased fascial restrictions, impaired flexibility, and postural dysfunction.   ACTIVITY LIMITATIONS: squatting, stairs, bed mobility, and locomotion level  PARTICIPATION LIMITATIONS: shopping and community activity  PERSONAL FACTORS: Age, Fitness,  and Time since onset of injury/illness/exacerbation are also affecting patient's functional outcome, visual disturbances.   REHAB POTENTIAL: Good  CLINICAL DECISION MAKING: Evolving/moderate complexity  EVALUATION COMPLEXITY: Moderate  GOALS: Goals reviewed with patient? Yes  SHORT TERM GOALS: Target date: 09/12/23  Pt will be independent with initial HEP for stretching and strengthening.  Baseline: Goal status: IN PROGRESS  2.  Pt will be able to complete 12 sit to stands in 30 sec without use of UEs to decrease fall risk.  Baseline:  Goal status: IN PROGRESS 10/17/23- pt is able to complete 10 in 30 sec  LONG TERM GOALS: Target date: 09/26/23  Pt will be able to complete 13 sit to stands in 30 sec without use of UEs to decrease fall risk. Baseline:  Goal status: IN PROGRESS 10/17/23- 10 reps  2.  Pt will demonstrate 5/5 bilateral hip flexor strength to decrease fall risk.  Baseline:  Goal status: MET 10/17/23 bilaterally  3.  Pt will score at least 51 on the BERG balance to decrease fall risk.  Baseline:  Goal status: IN PROGRESS 10/17/23  4.  Pt will report she is able to  go down a flight a steps without fear of falling to decrease risk of falling.  Baseline:  Goal status: IN PROGRESS 10/17/23- still fearful of falling, pt still has decreased SLS time  5.  Pt will be independent in a final home exercise program for continued stretching and strengthening independently.  Baseline:  Goal status: IN PROGRESS   PLAN:  PT FREQUENCY: 2x/week  PT DURATION: 4 weeks  PLANNED INTERVENTIONS: 97164- PT Re-evaluation, 97750- Physical Performance Testing, 97110-Therapeutic exercises, 97530- Therapeutic activity, [id number]- Neuromuscular re-education, 97535- Self Care, [id number]- Manual therapy, and 97116- Gait training  PLAN FOR NEXT SESSION: continue to progress UE and LE strengthening exercises. work on [employer] and narrow base of support, hip strengthening, pec stretching, posture    [employer], PT 10/19/2023, 1:06 PM

## 2023-10-24 ENCOUNTER — Encounter: Payer: Self-pay | Admitting: Physical Therapy

## 2023-10-24 ENCOUNTER — Ambulatory Visit: Admitting: Physical Therapy

## 2023-10-24 DIAGNOSIS — R293 Abnormal posture: Secondary | ICD-10-CM | POA: Diagnosis not present

## 2023-10-24 DIAGNOSIS — R262 Difficulty in walking, not elsewhere classified: Secondary | ICD-10-CM

## 2023-10-24 DIAGNOSIS — M6281 Muscle weakness (generalized): Secondary | ICD-10-CM | POA: Diagnosis not present

## 2023-10-24 DIAGNOSIS — Z961 Presence of intraocular lens: Secondary | ICD-10-CM | POA: Diagnosis not present

## 2023-10-24 DIAGNOSIS — C50412 Malignant neoplasm of upper-outer quadrant of left female breast: Secondary | ICD-10-CM

## 2023-10-24 DIAGNOSIS — H26493 Other secondary cataract, bilateral: Secondary | ICD-10-CM | POA: Diagnosis not present

## 2023-10-24 DIAGNOSIS — Z171 Estrogen receptor negative status [ER-]: Secondary | ICD-10-CM | POA: Diagnosis not present

## 2023-10-24 NOTE — Therapy (Signed)
 OUTPATIENT PHYSICAL THERAPY ONCOLOGY TREATMENT  Patient Name: Jessica [name] MRN: [id number] DOB:09-21-1940, 83 y.o., female Today's Date: 10/24/2023  END OF SESSION:  PT End of Session - 10/24/23 1409     Visit Number 8    Number of Visits 14    Date for PT Re-Evaluation 11/14/23    PT Start Time 1407    PT Stop Time 1440    PT Time Calculation (min) 33 min    Activity Tolerance Patient tolerated treatment well    Behavior During Therapy [facility] for tasks assessed/performed              Past Medical History:  Diagnosis Date   Anemia 12/2015   Bronchitis, chronic (HCC)    in her 63s   Cancer (HCC)    breast   Cataract    Chronic kidney disease    in 20s had nephritis   Closed fracture of right distal humerus    right   Coronary artery disease    Elevated Lp(a)    Hand, foot and mouth disease (HFMD)    Hearing loss 05/03/2016   High cholesterol    Hypothyroidism    Myocardial infarction (HCC)    Neuropathy    toes   Osteopenia    Pericarditis 01/1999   Pneumothorax 01/1999   Thyroid  disorder    Vitamin B 12 deficiency    Past Surgical History:  Procedure Laterality Date   birthmark      removed,left leg   BREAST BIOPSY Left 09/13/2022   US  LT BREAST BX W LOC DEV 1ST LESION IMG BX SPEC US  GUIDE 09/13/2022 [facility] MAMMOGRAPHY   CARDIAC CATHETERIZATION     CATARACT EXTRACTION  2017   CORONARY STENT INTERVENTION N/A 09/01/2020   Procedure: CORONARY STENT INTERVENTION;  Surgeon: Claudene Victory ORN, MD;  Location: [facility];  Service: Cardiovascular;  Laterality: N/A;   LEFT HEART CATH AND CORONARY ANGIOGRAPHY N/A 09/01/2020   Procedure: LEFT HEART CATH AND CORONARY ANGIOGRAPHY;  Surgeon: Claudene Victory ORN, MD;  Location: [facility];  Service: Cardiovascular;  Laterality: N/A;   ORIF HUMERUS FRACTURE Right 04/10/2019   Procedure: RIGHT OPEN REDUCTION INTERNAL FIXATION (ORIF) DISTAL HUMERUS FRACTURE WITH EXTENSION;  Surgeon: Cristy Bonner DASEN, MD;  Location:  [facility];  Service: Orthopedics;  Laterality: Right;   PORTACATH PLACEMENT N/A 11/15/2022   Procedure: PORT PLACEMENT WITH ULTRASOUND GUIDANCE;  Surgeon: Vanderbilt Ned, MD;  Location: [facility];  Service: General;  Laterality: N/A;   SIMPLE MASTECTOMY WITH AXILLARY SENTINEL NODE BIOPSY Bilateral 10/18/2022   Procedure: BILATERAL SIMPLE MASTECTOMY;  Surgeon: Vanderbilt Ned, MD;  Location: [facility];  Service: General;  Laterality: Bilateral;  PEC BLOCK   TONSILLECTOMY AND ADENOIDECTOMY  1952?   UMBILICAL HERNIA REPAIR N/A 09/20/2023   Procedure: REPAIR, HERNIA, UMBILICAL, ADULT;  Surgeon: Lyndel Deward PARAS, MD;  Location: [facility];  Service: General;  Laterality: N/A;   Patient Active Problem List   Diagnosis Date Noted   Blurry vision, bilateral 08/30/2023   Paresthesia 08/30/2023   Malignant neoplasm of upper-outer quadrant of left breast in female, estrogen receptor negative (HCC) 09/21/2022   Atherosclerotic heart disease of native coronary artery with other forms of angina pectoris (HCC) 04/08/2022   Swelling of left foot 04/07/2022   Left hand fracture, sequela 04/07/2022   Depression with anxiety 04/07/2022   NSTEMI (non-ST elevated myocardial infarction) (HCC) 09/01/2020   Closed fracture of right distal humerus 04/10/2019   Dyspnea  05/09/2018   Vitamin B 12 deficiency 05/09/2018   Cough 03/06/2018   Senile osteoporosis 11/20/2017   Iron deficiency 04/26/2017   Lichen sclerosus 04/26/2017   External hemorrhoid 04/26/2017   Weakness of left leg 06/07/2016   Balance disorder 05/24/2016   Memory deficit 05/24/2016   Anemia 05/19/2016   Hearing loss 05/03/2016   Hypothyroidism 07/24/2013   Bradycardia on ECG 07/24/2013   Numbness 08/07/2012   HYPERLIPIDEMIA 12/29/2006    PCP: Fredia Bring, MD  REFERRING PROVIDER: Odean Potts, MD  REFERRING DIAG: R26.89 (ICD-10-CM) - Balance problem  THERAPY DIAG:  Muscle weakness  (generalized)  Difficulty in walking, not elsewhere classified  Abnormal posture  Malignant neoplasm of upper-outer quadrant of left breast in female, estrogen receptor negative (HCC)  ONSET DATE: 10/18/22  Rationale for Evaluation and Treatment: Rehabilitation  SUBJECTIVE:                                                                                                                                                                                           SUBJECTIVE STATEMENT: I really over did it yesterday. I should have probably cancelled today. I used the leg press machine on 60 lbs and I can't remember how many I did. I also did arm stuff. I did not keep track of resistance or reps. I used the resisted pulleys.   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 She underwent a bilateral mastectomy and left sentinel node biopsy (7 negative nodes) on 10/18/2022. It is triple negative with a Ki67 of 5%. She has some cardiac history and had an elbow fracture in 2022 resulting in surgery where pins were placed.   PAIN:  Are you having pain? Yes low back and upper R hip area, 5/10, getting up makes it worse, leaning forward/curling up makes it feel better   PRECAUTIONS: Other: L UE lymphedema risk  WEIGHT BEARING RESTRICTIONS: No  FALLS:  Has patient fallen in last 6 months? No  LIVING ENVIRONMENT: Lives with: lives alone but at [facility] Lives in: House/apartment Stairs: No Has following equipment at home: Single point cane  OCCUPATION: retired  LEISURE: goes to water exercise 1x/wk, tries to do some walking  HAND DOMINANCE: right   PRIOR LEVEL OF FUNCTION: Independent  PATIENT GOALS: to become more stable, to have more strength   OBJECTIVE: Note: Objective measures were completed at Evaluation unless otherwise noted.  COGNITION: Overall cognitive status: Within functional limits for tasks  assessed   POSTURE: forward head, rounded shoulders  LOWER EXTREMITY STRENGTH:   MMT Right Eval on 12/05/22 RIGHT  08/29/23  on EVAL RIGHT 10/17/23  Hip flexion 5/5 4/5 5/5  Hip extension 4/5 4+/5 5/5  Hip abduction 4/5 5/5 5/5  Hip adduction      Hip internal rotation      Hip external rotation      Knee flexion 5/5 5/5 5/5  Knee extension 5/5 5/5 5/5  Ankle dorsiflexion 5/5 5/5   Ankle plantarflexion      Ankle inversion      Ankle eversion       (Blank rows = not tested)   A/PROM LEFT Eval on 12/05/22 LEFT 08/29/23 EVAL LEFT 10/17/23  Hip flexion 5/5 4/5 5/5  Hip extension 3/5 4/5 3+/5  Hip abduction 4+/5 5/5 5/5  Hip adduction      Hip internal rotation      Hip external rotation      Knee flexion 5/5 5/5 5/5  Knee extension 5/5 5/5 5/5  Ankle dorsiflexion 5/5 5/5   Ankle plantarflexion      Ankle inversion      Ankle eversion       (Blank rows = not tested)   FUNCTIONAL TEST: 10/17/23: 30 sec sit to stand: 10 reps without use of UEs which is average for her age       4 square step test: 12.72 sec SLS on R: 10 sec, SLS on L: 5 sec        08/29/23: 30 SEC SIT TO STAND: 11 reps which is good for her age 32/25/24: 30 SEC SIT TO STAND: 13 reps which is below average for her age 13/24/25: BERG balance test: 48/56, anything less than 45 is a greater risk of falling             01/05/23: BERG Balance Test: 51/56, anything less than 45 is a greater risk of falling   GAIT: Distance walked: 20 feet Assistive device utilized: None Level of assistance: Modified independence Comments: decreased arm swing bilaterally, occasional weaving                                                                                                                            TREATMENT DATE:  10/24/23 NuStep seat at 7, level 5 x 10 min 16 sec 439 steps  Went through pt's past HEP handouts - she does not have any strengthening handouts currently - she only had ROM Went through pictures of  pt's gym equipment to begin to create an HEP that she can follow at the gym Discussed importance of keeping up with reps and resistance at the gym since she over did it and was too sore today to do exercises so session was shortened  10/19/23 NuStep seat at 7, level 5 x 10 min 25 sec 571 steps  Scapular retraction on dual cable machine x 7 lbs x 2 sets of 15 reps with pt returning therapist demo, then push/pull x 3 lbs x 10 reps bilaterally with pt returning therapist demo (attempted 7lbs but was  too much), ER returning therapist demo bilaterally 3 lbs, lat pull down x 7 lbs x 15 reps x 2 sets bilaterally 3 way shoulder raises with 2 lb weights with neck in retraction and axial extension standing against wall with pillow behind head with frequent v/c for posture Standing on 10 inch step slowly lowering leg as if to stand down x 10 bilaterally with pt having increased difficulty with this  10/17/23 Reassessed goals and did functional tests Scapular retraction on dual cable machine x 7 lbs x 2 sets of 10 reps with pt returning therapist demo, then push/pull x 3 lbs x 10 reps bilaterally with pt returning therapist demo (attempted 7lbs but was too much), ER returning therapist demo bilaterally 3 lbs, lat pull down x 7 lbs x 10 reps bilaterally 3 way shoulder raises with 2 lb weights with neck in retraction and axial extension standing against wall with pillow behind head NuStep seat at 7, level 5 x 10 min 25 sec 561 steps   09/12/23:  NuStep seat at 7, level 5 x 10 min 25 sec 539 steps to warm up  Neuro Re Ed: In // bars:  Standing on blue oval air ex on R LE while doing 3 way hip with no resistance but focus on keeping balance with no HHA today or loss of balance x 10 bilaterally Mini squats x 15 reps on air ex pad with no use of // bars for balance Tandem walking on air ex x 6 reps with very little use of finger tips on // bars for balance Hip machine x 45 lbs x 15 reps in direction of flexion,  abduction and ER with pt returning therapist demo Leg press seat at 5 x 90 lbs x 20 reps reps Scapular retraction on dual cable machine x 7 lbs x 20 reps (attempted 10 lbs but it was too much) with pt returning therapist demo, then push/pull x 3 lbs x 20 reps bilaterally with pt returning therapist demo (attempted 7lbs but was too much), ER returning therapist demo bilaterally 3 lbs, shoulder extension x 3 lbs x 10 reps bilaterally  09/07/23:  NuStep seat at 8, level 5, steps 638 x 10 min 10 sec 438 steps to warm up  Neuro Re Ed: In // bars:  Standing on blue oval air ex on R LE while doing 3 way hip with no resistance but focus on keeping balance with no HHA today x 10 bilaterally Tandem walking on air ex x 6 reps with minimal use of // bars Step ups on 6 in step: step up/up then down/down then stepping up and over and then stepping backwards up and over all x 10 each Hip machine x 40 lbs x 10 reps in direction of flexion, abduction and ER with pt returning therapist demo Leg press seat at 5 x 90 lbs x 2 sets of 10 reps reps Standing with purple ball behind head doing chin tucks while performing 3 way shoulder with 1 lbs weight x 10 reps in each direction Scapular retraction on dual cable machine x 7 lbs x 10 reps with pt returning therapist demo, then push/pull x 3 lbs x 10 reps bilaterally with pt returning therapist demo In supine over 1/2 foam roll: horizontal abduction x 10 to stretch chest, snow angels x 10 with pt very limited in ROM due to tightness, lying with arms outstretched x 3 min  09/05/23:  Neuro Re Ed: In // bars:  Standing on blue oval air  ex on R LE while doing 3 way hip using 2 lb ankle weight on L LE to keep movement slow and controlled throughout with hand held assist to maintain balance x 10 reps in all directions then repeated on opposite side  Tandem walking on air ex x 6 reps  Step ups on 6 in step: step up/up then down/down then stepping up and over and then stepping  backwards up and over all x 10 each NuStep seat at 8, level 4, steps 638 x 10 min 7 sec to warm up with v/c to avoid hip adduction while educating pt on awareness of posture and neck retraction exercise to help with posture Standing on air ex: mini squats x 10 with v/c to avoid hip adduction with no HHA Hip machine x 25 lbs x 10 reps in direction of flexion, abduction and ER with pt returning therapist demo Leg press seat at 5 x 70 lbs x 20 reps Standing with purple ball behind head doing chin tucks while performing 3 way shoulder with 1 lbs weight x 10 reps in each direction Scapular retraction on dual cable machine x 7 lbs x 10 reps with pt returning therapist demo In supine over 1/2 foam roll: horizontal abduction x 10 to stretch chest, snow angels x 10 with pt very limited in ROM due to tightness, lying with arms outstretched x 3 min  08/30/23: NuStep seat at 8, level 3, steps 658 for 10:13 to warm up with v/c to avoid hip adduction while educating pt on awareness of posture and neck retraction exercise to help with posture Neuro Re Ed: In // bars:  Standing on blue oval air ex on R LE while doing 3 way hip on L LE to keep movement slow and controlled throughout with hand held assist to maintain balance x 10 reps in all directions then repeated on opposite side  Tandem walking on air ex x 6 reps  Step ups on 6 in step: step up/up then down/down then stepping up and over and then stepping backwards up and over all x 10 each Standing on air ex: mini squats x 10 with v/c to avoid hip adduction Practicing ascending/descending 1 flight of steps first with 1 HHA then with no HHA with v/c on form and importance of activation of muscles in feet for balance 08/29/23- none today due to time constraints   PATIENT EDUCATION:  Education details: goals for therapy, how she is currently vs at d/c last episode Person educated: Patient Education method: Explanation Education comprehension: verbalized  understanding  HOME EXERCISE PROGRAM: Access Code: [email or web address] URL: [email or web address] Date: 10/24/2023 Prepared by: Florina Lanis Carbon  Exercises - Forearm Walks on Wall with Resistance Band  - 1 x daily - 3-4 x weekly - 10 reps - Serratus Forearm Push Up Plus at Wall with Elbows  - 1 x daily - 3-4 x weekly - 10 reps - Eccentric Hamstring Curl with Weight Machine  - 1 x daily - 7 x weekly - 1 sets - 10 reps - Knee Extension with Weight Machine  - 1 x daily - 7 x weekly - 1 sets - 10 reps - Shoulder Press with Weight Machine  - 1 x daily - 7 x weekly - 1 sets - 10 reps - Scapular Retraction with Resistance  - 1 x daily - 7 x weekly - 1 sets - 10 reps - Standing Lat Pull Down with Resistance - Arms Straight  - 1 x daily - 7 x  weekly - 1 sets - 10 reps  ASSESSMENT:  CLINICAL IMPRESSION: Pt did strengthening exercises at the gym yesterday using the machines. She reports being very sore today and probably should have cancelled her appointment. She has increased back pain today most likely due to poor core strength and over doing it at the gym. Began looking through pictures of the equipment at the gym to help develop and exercise program that she can follow. Educated pt to not do any strengthening prior to next session and to jsut walk. Will begin core strengthening next session to help decrease back pain.   OBJECTIVE IMPAIRMENTS: difficulty walking, decreased strength, increased fascial restrictions, impaired flexibility, and postural dysfunction.   ACTIVITY LIMITATIONS: squatting, stairs, bed mobility, and locomotion level  PARTICIPATION LIMITATIONS: shopping and community activity  PERSONAL FACTORS: Age, Fitness, and Time since onset of injury/illness/exacerbation are also affecting patient's functional outcome, visual disturbances.   REHAB POTENTIAL: Good  CLINICAL DECISION MAKING: Evolving/moderate complexity  EVALUATION COMPLEXITY: Moderate  GOALS: Goals  reviewed with patient? Yes  SHORT TERM GOALS: Target date: 09/12/23  Pt will be independent with initial HEP for stretching and strengthening.  Baseline: Goal status: IN PROGRESS  2.  Pt will be able to complete 12 sit to stands in 30 sec without use of UEs to decrease fall risk.  Baseline:  Goal status: IN PROGRESS 10/17/23- pt is able to complete 10 in 30 sec  LONG TERM GOALS: Target date: 09/26/23  Pt will be able to complete 13 sit to stands in 30 sec without use of UEs to decrease fall risk. Baseline:  Goal status: IN PROGRESS 10/17/23- 10 reps  2.  Pt will demonstrate 5/5 bilateral hip flexor strength to decrease fall risk.  Baseline:  Goal status: MET 10/17/23 bilaterally  3.  Pt will score at least 51 on the BERG balance to decrease fall risk.  Baseline:  Goal status: IN PROGRESS 10/17/23  4.  Pt will report she is able to go down a flight a steps without fear of falling to decrease risk of falling.  Baseline:  Goal status: IN PROGRESS 10/17/23- still fearful of falling, pt still has decreased SLS time  5.  Pt will be independent in a final home exercise program for continued stretching and strengthening independently.  Baseline:  Goal status: IN PROGRESS   PLAN:  PT FREQUENCY: 2x/week  PT DURATION: 4 weeks  PLANNED INTERVENTIONS: 97164- PT Re-evaluation, 97750- Physical Performance Testing, 97110-Therapeutic exercises, 97530- Therapeutic activity, [id number]- Neuromuscular re-education, 97535- Self Care, [id number]- Manual therapy, and 97116- Gait training  PLAN FOR NEXT SESSION: continue to progress UE and LE strengthening exercises. work on [employer] and narrow base of support, hip strengthening, pec stretching, posture    [employer], PT 10/24/2023, 2:57 PM

## 2023-10-26 ENCOUNTER — Telehealth: Payer: Self-pay | Admitting: *Deleted

## 2023-10-26 ENCOUNTER — Encounter: Payer: Self-pay | Admitting: Internal Medicine

## 2023-10-26 ENCOUNTER — Ambulatory Visit: Admitting: Physical Therapy

## 2023-10-26 DIAGNOSIS — M810 Age-related osteoporosis without current pathological fracture: Secondary | ICD-10-CM

## 2023-10-26 NOTE — Telephone Encounter (Signed)
 Received [employer] Verification for Prolia  No Copay, NO PA

## 2023-10-26 NOTE — Telephone Encounter (Signed)
Tried calling patient, LMOM to return call.

## 2023-10-26 NOTE — Telephone Encounter (Signed)
 I have placed a Order if someone can call her and tell her to come to [facility] clinic for Lab work on 11/07/2023

## 2023-10-26 NOTE — Progress Notes (Signed)
 err

## 2023-10-26 NOTE — Telephone Encounter (Signed)
 Patient has a Prolia  appointment scheduled for 11/13/2023 and needs labwork done. Please place orders.

## 2023-10-27 NOTE — Telephone Encounter (Signed)
 Called patient and no answer. Voicemail was left with office call back number. Message routed to admin incase patient calls the office back.

## 2023-10-31 ENCOUNTER — Encounter: Payer: Self-pay | Admitting: Physical Therapy

## 2023-10-31 ENCOUNTER — Ambulatory Visit: Admitting: Physical Therapy

## 2023-10-31 DIAGNOSIS — C50412 Malignant neoplasm of upper-outer quadrant of left female breast: Secondary | ICD-10-CM | POA: Diagnosis not present

## 2023-10-31 DIAGNOSIS — M6281 Muscle weakness (generalized): Secondary | ICD-10-CM

## 2023-10-31 DIAGNOSIS — R262 Difficulty in walking, not elsewhere classified: Secondary | ICD-10-CM | POA: Diagnosis not present

## 2023-10-31 DIAGNOSIS — R293 Abnormal posture: Secondary | ICD-10-CM | POA: Diagnosis not present

## 2023-10-31 DIAGNOSIS — Z171 Estrogen receptor negative status [ER-]: Secondary | ICD-10-CM | POA: Diagnosis not present

## 2023-10-31 NOTE — Therapy (Signed)
 OUTPATIENT PHYSICAL THERAPY ONCOLOGY TREATMENT  Patient Name: Jessica [name] MRN: [id number] DOB:1941/02/01, 83 y.o., female Today's Date: 10/31/2023  END OF SESSION:  PT End of Session - 10/31/23 1208     Visit Number 9    Number of Visits 14    Date for PT Re-Evaluation 11/14/23    PT Start Time 1207    PT Stop Time 1300    PT Time Calculation (min) 53 min    Activity Tolerance Patient tolerated treatment well    Behavior During Therapy [facility] for tasks assessed/performed              Past Medical History:  Diagnosis Date   Anemia 12/2015   Bronchitis, chronic (HCC)    in her 68s   Cancer (HCC)    breast   Cataract    Chronic kidney disease    in 20s had nephritis   Closed fracture of right distal humerus    right   Coronary artery disease    Elevated Lp(a)    Hand, foot and mouth disease (HFMD)    Hearing loss 05/03/2016   High cholesterol    Hypothyroidism    Myocardial infarction (HCC)    Neuropathy    toes   Osteopenia    Pericarditis 01/1999   Pneumothorax 01/1999   Thyroid  disorder    Vitamin B 12 deficiency    Past Surgical History:  Procedure Laterality Date   birthmark      removed,left leg   BREAST BIOPSY Left 09/13/2022   US  LT BREAST BX W LOC DEV 1ST LESION IMG BX SPEC US  GUIDE 09/13/2022 [facility] MAMMOGRAPHY   CARDIAC CATHETERIZATION     CATARACT EXTRACTION  2017   CORONARY STENT INTERVENTION N/A 09/01/2020   Procedure: CORONARY STENT INTERVENTION;  Surgeon: Claudene Victory ORN, MD;  Location: [facility];  Service: Cardiovascular;  Laterality: N/A;   LEFT HEART CATH AND CORONARY ANGIOGRAPHY N/A 09/01/2020   Procedure: LEFT HEART CATH AND CORONARY ANGIOGRAPHY;  Surgeon: Claudene Victory ORN, MD;  Location: [facility];  Service: Cardiovascular;  Laterality: N/A;   ORIF HUMERUS FRACTURE Right 04/10/2019   Procedure: RIGHT OPEN REDUCTION INTERNAL FIXATION (ORIF) DISTAL HUMERUS FRACTURE WITH EXTENSION;  Surgeon: Cristy Bonner DASEN, MD;  Location:  [facility];  Service: Orthopedics;  Laterality: Right;   PORTACATH PLACEMENT N/A 11/15/2022   Procedure: PORT PLACEMENT WITH ULTRASOUND GUIDANCE;  Surgeon: Vanderbilt Ned, MD;  Location: [facility];  Service: General;  Laterality: N/A;   SIMPLE MASTECTOMY WITH AXILLARY SENTINEL NODE BIOPSY Bilateral 10/18/2022   Procedure: BILATERAL SIMPLE MASTECTOMY;  Surgeon: Vanderbilt Ned, MD;  Location: [facility];  Service: General;  Laterality: Bilateral;  PEC BLOCK   TONSILLECTOMY AND ADENOIDECTOMY  1952?   UMBILICAL HERNIA REPAIR N/A 09/20/2023   Procedure: REPAIR, HERNIA, UMBILICAL, ADULT;  Surgeon: Lyndel Deward PARAS, MD;  Location: [facility];  Service: General;  Laterality: N/A;   Patient Active Problem List   Diagnosis Date Noted   Blurry vision, bilateral 08/30/2023   Paresthesia 08/30/2023   Malignant neoplasm of upper-outer quadrant of left breast in female, estrogen receptor negative (HCC) 09/21/2022   Atherosclerotic heart disease of native coronary artery with other forms of angina pectoris (HCC) 04/08/2022   Swelling of left foot 04/07/2022   Left hand fracture, sequela 04/07/2022   Depression with anxiety 04/07/2022   NSTEMI (non-ST elevated myocardial infarction) (HCC) 09/01/2020   Closed fracture of right distal humerus 04/10/2019   Dyspnea  05/09/2018   Vitamin B 12 deficiency 05/09/2018   Cough 03/06/2018   Senile osteoporosis 11/20/2017   Iron deficiency 04/26/2017   Lichen sclerosus 04/26/2017   External hemorrhoid 04/26/2017   Weakness of left leg 06/07/2016   Balance disorder 05/24/2016   Memory deficit 05/24/2016   Anemia 05/19/2016   Hearing loss 05/03/2016   Hypothyroidism 07/24/2013   Bradycardia on ECG 07/24/2013   Numbness 08/07/2012   HYPERLIPIDEMIA 12/29/2006    PCP: Fredia Bring, MD  REFERRING PROVIDER: Odean Potts, MD  REFERRING DIAG: R26.89 (ICD-10-CM) - Balance problem  THERAPY DIAG:  Muscle weakness  (generalized)  Difficulty in walking, not elsewhere classified  Abnormal posture  Malignant neoplasm of upper-outer quadrant of left breast in female, estrogen receptor negative (HCC)  ONSET DATE: 10/18/22  Rationale for Evaluation and Treatment: Rehabilitation  SUBJECTIVE:                                                                                                                                                                                           SUBJECTIVE STATEMENT: I am still having some back pain after that leg press that I did at the gym on my own. I over did it. It is getting better. I also did yoga and water aerobics.   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 She underwent a bilateral mastectomy and left sentinel node biopsy (7 negative nodes) on 10/18/2022. It is triple negative with a Ki67 of 5%. She has some cardiac history and had an elbow fracture in 2022 resulting in surgery where pins were placed.   PAIN:  Are you having pain? Yes low back and upper R hip area, 1/10, getting up makes it worse, leaning forward/curling up makes it feel better   PRECAUTIONS: Other: L UE lymphedema risk  WEIGHT BEARING RESTRICTIONS: No  FALLS:  Has patient fallen in last 6 months? No  LIVING ENVIRONMENT: Lives with: lives alone but at [facility] Lives in: House/apartment Stairs: No Has following equipment at home: Single point cane  OCCUPATION: retired  LEISURE: goes to water exercise 1x/wk, tries to do some walking  HAND DOMINANCE: right   PRIOR LEVEL OF FUNCTION: Independent  PATIENT GOALS: to become more stable, to have more strength   OBJECTIVE: Note: Objective measures were completed at Evaluation unless otherwise noted.  COGNITION: Overall cognitive status: Within functional limits for tasks assessed   POSTURE: forward head, rounded shoulders  LOWER EXTREMITY STRENGTH:   MMT  Right Eval on 12/05/22 RIGHT  08/29/23 on EVAL RIGHT 10/17/23  Hip flexion 5/5 4/5 5/5  Hip extension  4/5 4+/5 5/5  Hip abduction 4/5 5/5 5/5  Hip adduction      Hip internal rotation      Hip external rotation      Knee flexion 5/5 5/5 5/5  Knee extension 5/5 5/5 5/5  Ankle dorsiflexion 5/5 5/5   Ankle plantarflexion      Ankle inversion      Ankle eversion       (Blank rows = not tested)   A/PROM LEFT Eval on 12/05/22 LEFT 08/29/23 EVAL LEFT 10/17/23  Hip flexion 5/5 4/5 5/5  Hip extension 3/5 4/5 3+/5  Hip abduction 4+/5 5/5 5/5  Hip adduction      Hip internal rotation      Hip external rotation      Knee flexion 5/5 5/5 5/5  Knee extension 5/5 5/5 5/5  Ankle dorsiflexion 5/5 5/5   Ankle plantarflexion      Ankle inversion      Ankle eversion       (Blank rows = not tested)   FUNCTIONAL TEST: 10/17/23: 30 sec sit to stand: 10 reps without use of UEs which is average for her age       4 square step test: 12.72 sec SLS on R: 10 sec, SLS on L: 5 sec        08/29/23: 30 SEC SIT TO STAND: 11 reps which is good for her age 52/25/24: 30 SEC SIT TO STAND: 13 reps which is below average for her age 53/24/25: BERG balance test: 48/56, anything less than 45 is a greater risk of falling             01/05/23: BERG Balance Test: 51/56, anything less than 45 is a greater risk of falling   GAIT: Distance walked: 20 feet Assistive device utilized: None Level of assistance: Modified independence Comments: decreased arm swing bilaterally, occasional weaving                                                                                                                            TREATMENT DATE:  10/31/23: Discussed proper way to progress exercises to decrease risk of lymphedema and to avoid injury Had pt send therapist pictures of all machines and exercises and therapist printed each to create a home exercise program specific to the equipment she has available to her at her gym [employer]  Strengthening: Pelvic tilts x 10 reps and 5 sec holds with v/c for form Pelvic tilts with marching x 10 bilaterally Pelvic tilts with hip abduction with v/c to keep pelvis stable throughout x 10 reps Pelvic tilts with straight leg raises x 10 with pt found more challenging Attempted superman but pt had difficulty with balance and pain in wrist, gave dumbbells to help with wrist position but decided to stop exercise  10/24/23 NuStep seat at 7, level 5 x 10 min 16 sec 439 steps  Went through pt's past HEP handouts - she does not have any strengthening  handouts currently - she only had ROM Went through pictures of pt's gym equipment to begin to create an HEP that she can follow at the gym Discussed importance of keeping up with reps and resistance at the gym since she over did it and was too sore today to do exercises so session was shortened  10/19/23 NuStep seat at 7, level 5 x 10 min 25 sec 571 steps  Scapular retraction on dual cable machine x 7 lbs x 2 sets of 15 reps with pt returning therapist demo, then push/pull x 3 lbs x 10 reps bilaterally with pt returning therapist demo (attempted 7lbs but was too much), ER returning therapist demo bilaterally 3 lbs, lat pull down x 7 lbs x 15 reps x 2 sets bilaterally 3 way shoulder raises with 2 lb weights with neck in retraction and axial extension standing against wall with pillow behind head with frequent v/c for posture Standing on 10 inch step slowly lowering leg as if to stand down x 10 bilaterally with pt having increased difficulty with this  10/17/23 Reassessed goals and did functional tests Scapular retraction on dual cable machine x 7 lbs x 2 sets of 10 reps with pt returning therapist demo, then push/pull x 3 lbs x 10 reps bilaterally with pt returning therapist demo (attempted 7lbs but was too much), ER returning therapist demo bilaterally 3 lbs, lat pull down x 7 lbs x 10 reps bilaterally 3 way shoulder raises with 2 lb weights with neck  in retraction and axial extension standing against wall with pillow behind head NuStep seat at 7, level 5 x 10 min 25 sec 561 steps   09/12/23:  NuStep seat at 7, level 5 x 10 min 25 sec 539 steps to warm up  Neuro Re Ed: In // bars:  Standing on blue oval air ex on R LE while doing 3 way hip with no resistance but focus on keeping balance with no HHA today or loss of balance x 10 bilaterally Mini squats x 15 reps on air ex pad with no use of // bars for balance Tandem walking on air ex x 6 reps with very little use of finger tips on // bars for balance Hip machine x 45 lbs x 15 reps in direction of flexion, abduction and ER with pt returning therapist demo Leg press seat at 5 x 90 lbs x 20 reps reps Scapular retraction on dual cable machine x 7 lbs x 20 reps (attempted 10 lbs but it was too much) with pt returning therapist demo, then push/pull x 3 lbs x 20 reps bilaterally with pt returning therapist demo (attempted 7lbs but was too much), ER returning therapist demo bilaterally 3 lbs, shoulder extension x 3 lbs x 10 reps bilaterally  09/07/23:  NuStep seat at 8, level 5, steps 638 x 10 min 10 sec 438 steps to warm up  Neuro Re Ed: In // bars:  Standing on blue oval air ex on R LE while doing 3 way hip with no resistance but focus on keeping balance with no HHA today x 10 bilaterally Tandem walking on air ex x 6 reps with minimal use of // bars Step ups on 6 in step: step up/up then down/down then stepping up and over and then stepping backwards up and over all x 10 each Hip machine x 40 lbs x 10 reps in direction of flexion, abduction and ER with pt returning therapist demo Leg press seat at 5 x 90 lbs  x 2 sets of 10 reps reps Standing with purple ball behind head doing chin tucks while performing 3 way shoulder with 1 lbs weight x 10 reps in each direction Scapular retraction on dual cable machine x 7 lbs x 10 reps with pt returning therapist demo, then push/pull x 3 lbs x 10 reps bilaterally  with pt returning therapist demo In supine over 1/2 foam roll: horizontal abduction x 10 to stretch chest, snow angels x 10 with pt very limited in ROM due to tightness, lying with arms outstretched x 3 min  09/05/23:  Neuro Re Ed: In // bars:  Standing on blue oval air ex on R LE while doing 3 way hip using 2 lb ankle weight on L LE to keep movement slow and controlled throughout with hand held assist to maintain balance x 10 reps in all directions then repeated on opposite side  Tandem walking on air ex x 6 reps  Step ups on 6 in step: step up/up then down/down then stepping up and over and then stepping backwards up and over all x 10 each NuStep seat at 8, level 4, steps 638 x 10 min 7 sec to warm up with v/c to avoid hip adduction while educating pt on awareness of posture and neck retraction exercise to help with posture Standing on air ex: mini squats x 10 with v/c to avoid hip adduction with no HHA Hip machine x 25 lbs x 10 reps in direction of flexion, abduction and ER with pt returning therapist demo Leg press seat at 5 x 70 lbs x 20 reps Standing with purple ball behind head doing chin tucks while performing 3 way shoulder with 1 lbs weight x 10 reps in each direction Scapular retraction on dual cable machine x 7 lbs x 10 reps with pt returning therapist demo In supine over 1/2 foam roll: horizontal abduction x 10 to stretch chest, snow angels x 10 with pt very limited in ROM due to tightness, lying with arms outstretched x 3 min  08/30/23: NuStep seat at 8, level 3, steps 658 for 10:13 to warm up with v/c to avoid hip adduction while educating pt on awareness of posture and neck retraction exercise to help with posture Neuro Re Ed: In // bars:  Standing on blue oval air ex on R LE while doing 3 way hip on L LE to keep movement slow and controlled throughout with hand held assist to maintain balance x 10 reps in all directions then repeated on opposite side  Tandem walking on air ex x 6  reps  Step ups on 6 in step: step up/up then down/down then stepping up and over and then stepping backwards up and over all x 10 each Standing on air ex: mini squats x 10 with v/c to avoid hip adduction Practicing ascending/descending 1 flight of steps first with 1 HHA then with no HHA with v/c on form and importance of activation of muscles in feet for balance 08/29/23- none today due to time constraints   PATIENT EDUCATION:  Education details: goals for therapy, how she is currently vs at d/c last episode Person educated: Patient Education method: Explanation Education comprehension: verbalized understanding  HOME EXERCISE PROGRAM: Access Code: [email or web address] URL: [email or web address] Date: 10/24/2023 Prepared by: Florina Lanis Carbon  Exercises - Forearm Walks on Wall with Resistance Band  - 1 x daily - 3-4 x weekly - 10 reps - Serratus Forearm Push Up Plus at Wall with Elbows  - 1 x  daily - 3-4 x weekly - 10 reps - Eccentric Hamstring Curl with Weight Machine  - 1 x daily - 7 x weekly - 1 sets - 10 reps - Knee Extension with Weight Machine  - 1 x daily - 7 x weekly - 1 sets - 10 reps - Shoulder Press with Weight Machine  - 1 x daily - 7 x weekly - 1 sets - 10 reps - Scapular Retraction with Resistance  - 1 x daily - 7 x weekly - 1 sets - 10 reps - Standing Lat Pull Down with Resistance - Arms Straight  - 1 x daily - 7 x weekly - 1 sets - 10 reps  ASSESSMENT:  CLINICAL IMPRESSION: Began core strengthening exercises today to help decrease back pain and improve posture. Will practice pt getting up/down from floor next session since she would be doing her core exercises on the floor. Began creating home exercise program for pt's specific equipment at her gym.   OBJECTIVE IMPAIRMENTS: difficulty walking, decreased strength, increased fascial restrictions, impaired flexibility, and postural dysfunction.   ACTIVITY LIMITATIONS: squatting, stairs, bed mobility, and locomotion  level  PARTICIPATION LIMITATIONS: shopping and community activity  PERSONAL FACTORS: Age, Fitness, and Time since onset of injury/illness/exacerbation are also affecting patient's functional outcome, visual disturbances.   REHAB POTENTIAL: Good  CLINICAL DECISION MAKING: Evolving/moderate complexity  EVALUATION COMPLEXITY: Moderate  GOALS: Goals reviewed with patient? Yes  SHORT TERM GOALS: Target date: 09/12/23  Pt will be independent with initial HEP for stretching and strengthening.  Baseline: Goal status: IN PROGRESS  2.  Pt will be able to complete 12 sit to stands in 30 sec without use of UEs to decrease fall risk.  Baseline:  Goal status: IN PROGRESS 10/17/23- pt is able to complete 10 in 30 sec  LONG TERM GOALS: Target date: 09/26/23  Pt will be able to complete 13 sit to stands in 30 sec without use of UEs to decrease fall risk. Baseline:  Goal status: IN PROGRESS 10/17/23- 10 reps  2.  Pt will demonstrate 5/5 bilateral hip flexor strength to decrease fall risk.  Baseline:  Goal status: MET 10/17/23 bilaterally  3.  Pt will score at least 51 on the BERG balance to decrease fall risk.  Baseline:  Goal status: IN PROGRESS 10/17/23  4.  Pt will report she is able to go down a flight a steps without fear of falling to decrease risk of falling.  Baseline:  Goal status: IN PROGRESS 10/17/23- still fearful of falling, pt still has decreased SLS time  5.  Pt will be independent in a final home exercise program for continued stretching and strengthening independently.  Baseline:  Goal status: IN PROGRESS   PLAN:  PT FREQUENCY: 2x/week  PT DURATION: 4 weeks  PLANNED INTERVENTIONS: 97164- PT Re-evaluation, 97750- Physical Performance Testing, 97110-Therapeutic exercises, 97530- Therapeutic activity, [id number]- Neuromuscular re-education, 97535- Self Care, [id number]- Manual therapy, and 97116- Gait training  PLAN FOR NEXT SESSION: finish HEP, cont core exercises and practicing  getting up/down from floor, continue to progress UE and LE strengthening exercises. work on [employer] and narrow base of support, hip strengthening, pec stretching, posture    [employer], PT 10/31/2023, 1:12 PM

## 2023-11-02 ENCOUNTER — Ambulatory Visit: Admitting: Physical Therapy

## 2023-11-02 ENCOUNTER — Encounter: Payer: Self-pay | Admitting: Physical Therapy

## 2023-11-02 DIAGNOSIS — M6281 Muscle weakness (generalized): Secondary | ICD-10-CM

## 2023-11-02 DIAGNOSIS — R262 Difficulty in walking, not elsewhere classified: Secondary | ICD-10-CM

## 2023-11-02 DIAGNOSIS — C50412 Malignant neoplasm of upper-outer quadrant of left female breast: Secondary | ICD-10-CM

## 2023-11-02 DIAGNOSIS — R293 Abnormal posture: Secondary | ICD-10-CM | POA: Diagnosis not present

## 2023-11-02 DIAGNOSIS — Z171 Estrogen receptor negative status [ER-]: Secondary | ICD-10-CM | POA: Diagnosis not present

## 2023-11-02 NOTE — Therapy (Signed)
 Progress Note Reporting Period 08/29/23 to 11/02/23   See note below for Objective Data and Assessment of Progress/Goals.         OUTPATIENT PHYSICAL THERAPY ONCOLOGY TREATMENT  Patient Name: Jessica [name] MRN: [id number] DOB:[birth year], 83 y.o., female Today's Date: 11/02/2023  END OF SESSION:  PT End of Session - 11/02/23 0953     Visit Number 10    Number of Visits 14    Date for PT Re-Evaluation 11/14/23    PT Start Time 0903    PT Stop Time 0953    PT Time Calculation (min) 50 min    Activity Tolerance Patient tolerated treatment well    Behavior During Therapy [facility] for tasks assessed/performed               Past Medical History:  Diagnosis Date   Anemia 12/2015   Bronchitis, chronic (HCC)    in her 23s   Cancer (HCC)    breast   Cataract    Chronic kidney disease    in 20s had nephritis   Closed fracture of right distal humerus    right   Coronary artery disease    Elevated Lp(a)    Hand, foot and mouth disease (HFMD)    Hearing loss 05/03/2016   High cholesterol    Hypothyroidism    Myocardial infarction (HCC)    Neuropathy    toes   Osteopenia    Pericarditis 01/1999   Pneumothorax 01/1999   Thyroid  disorder    Vitamin B 12 deficiency    Past Surgical History:  Procedure Laterality Date   birthmark      removed,left leg   BREAST BIOPSY Left 09/13/2022   US  LT BREAST BX W LOC DEV 1ST LESION IMG BX SPEC US  GUIDE 09/13/2022 [facility] MAMMOGRAPHY   CARDIAC CATHETERIZATION     CATARACT EXTRACTION  2017   CORONARY STENT INTERVENTION N/A 09/01/2020   Procedure: CORONARY STENT INTERVENTION;  Surgeon: Claudene Victory ORN, MD;  Location: [facility];  Service: Cardiovascular;  Laterality: N/A;   LEFT HEART CATH AND CORONARY ANGIOGRAPHY N/A 09/01/2020   Procedure: LEFT HEART CATH AND CORONARY ANGIOGRAPHY;  Surgeon: Claudene Victory ORN, MD;  Location: [facility];  Service: Cardiovascular;  Laterality: N/A;   ORIF HUMERUS FRACTURE Right  04/10/2019   Procedure: RIGHT OPEN REDUCTION INTERNAL FIXATION (ORIF) DISTAL HUMERUS FRACTURE WITH EXTENSION;  Surgeon: Cristy Bonner DASEN, MD;  Location: [facility];  Service: Orthopedics;  Laterality: Right;   PORTACATH PLACEMENT N/A 11/15/2022   Procedure: PORT PLACEMENT WITH ULTRASOUND GUIDANCE;  Surgeon: Vanderbilt Ned, MD;  Location: [facility];  Service: General;  Laterality: N/A;   SIMPLE MASTECTOMY WITH AXILLARY SENTINEL NODE BIOPSY Bilateral 10/18/2022   Procedure: BILATERAL SIMPLE MASTECTOMY;  Surgeon: Vanderbilt Ned, MD;  Location: [facility];  Service: General;  Laterality: Bilateral;  PEC BLOCK   TONSILLECTOMY AND ADENOIDECTOMY  1952?   UMBILICAL HERNIA REPAIR N/A 09/20/2023   Procedure: REPAIR, HERNIA, UMBILICAL, ADULT;  Surgeon: Lyndel Deward PARAS, MD;  Location: [facility];  Service: General;  Laterality: N/A;   Patient Active Problem List   Diagnosis Date Noted   Blurry vision, bilateral 08/30/2023   Paresthesia 08/30/2023   Malignant neoplasm of upper-outer quadrant of left breast in female, estrogen receptor negative (HCC) 09/21/2022   Atherosclerotic heart disease of native coronary artery with other forms of angina pectoris (HCC) 04/08/2022   Swelling of left foot 04/07/2022   Left hand fracture, sequela  04/07/2022   Depression with anxiety 04/07/2022   NSTEMI (non-ST elevated myocardial infarction) (HCC) 09/01/2020   Closed fracture of right distal humerus 04/10/2019   Dyspnea 05/09/2018   Vitamin B 12 deficiency 05/09/2018   Cough 03/06/2018   Senile osteoporosis 11/20/2017   Iron deficiency 04/26/2017   Lichen sclerosus 04/26/2017   External hemorrhoid 04/26/2017   Weakness of left leg 06/07/2016   Balance disorder 05/24/2016   Memory deficit 05/24/2016   Anemia 05/19/2016   Hearing loss 05/03/2016   Hypothyroidism 07/24/2013   Bradycardia on ECG 07/24/2013   Numbness 08/07/2012   HYPERLIPIDEMIA 12/29/2006    PCP: Fredia Bring,  MD  REFERRING PROVIDER: Odean Potts, MD  REFERRING DIAG: R26.89 (ICD-10-CM) - Balance problem  THERAPY DIAG:  Muscle weakness (generalized)  Difficulty in walking, not elsewhere classified  Abnormal posture  Malignant neoplasm of upper-outer quadrant of left breast in female, estrogen receptor negative (HCC)  ONSET DATE: 10/18/22  Rationale for Evaluation and Treatment: Rehabilitation  SUBJECTIVE:                                                                                                                                                                                           SUBJECTIVE STATEMENT: I still have a little pain above the hip. I did not have any soreness after last session.   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 She underwent a bilateral mastectomy and left sentinel node biopsy (7 negative nodes) on 10/18/2022. It is triple negative with a Ki67 of 5%. She has some cardiac history and had an elbow fracture in 2022 resulting in surgery where pins were placed.   PAIN:  Are you having pain? Yes low back and upper R hip area, 1/10, getting up makes it worse, leaning forward/curling up makes it feel better   PRECAUTIONS: Other: L UE lymphedema risk  WEIGHT BEARING RESTRICTIONS: No  FALLS:  Has patient fallen in last 6 months? No  LIVING ENVIRONMENT: Lives with: lives alone but at [facility] Lives in: House/apartment Stairs: No Has following equipment at home: Single point cane  OCCUPATION: retired  LEISURE: goes to water exercise 1x/wk, tries to do some walking  HAND DOMINANCE: right   PRIOR LEVEL OF FUNCTION: Independent  PATIENT GOALS: to become more stable, to have more strength   OBJECTIVE: Note: Objective measures were completed at Evaluation unless otherwise noted.  COGNITION: Overall cognitive status: Within functional limits for tasks assessed   POSTURE: forward  head, rounded shoulders  LOWER EXTREMITY STRENGTH:   MMT Right Eval on 12/05/22 RIGHT  08/29/23 on EVAL  RIGHT 10/17/23  Hip flexion 5/5 4/5 5/5  Hip extension 4/5 4+/5 5/5  Hip abduction 4/5 5/5 5/5  Hip adduction      Hip internal rotation      Hip external rotation      Knee flexion 5/5 5/5 5/5  Knee extension 5/5 5/5 5/5  Ankle dorsiflexion 5/5 5/5   Ankle plantarflexion      Ankle inversion      Ankle eversion       (Blank rows = not tested)   A/PROM LEFT Eval on 12/05/22 LEFT 08/29/23 EVAL LEFT 10/17/23  Hip flexion 5/5 4/5 5/5  Hip extension 3/5 4/5 3+/5  Hip abduction 4+/5 5/5 5/5  Hip adduction      Hip internal rotation      Hip external rotation      Knee flexion 5/5 5/5 5/5  Knee extension 5/5 5/5 5/5  Ankle dorsiflexion 5/5 5/5   Ankle plantarflexion      Ankle inversion      Ankle eversion       (Blank rows = not tested)   FUNCTIONAL TEST: 10/17/23: 30 sec sit to stand: 10 reps without use of UEs which is average for her age       4 square step test: 12.72 sec SLS on R: 10 sec, SLS on L: 5 sec        08/29/23: 30 SEC SIT TO STAND: 11 reps which is good for her age 06/30/22: 30 SEC SIT TO STAND: 13 reps which is below average for her age 16/24/25: BERG balance test: 48/56, anything less than 45 is a greater risk of falling             01/05/23: BERG Balance Test: 51/56, anything less than 45 is a greater risk of falling   GAIT: Distance walked: 20 feet Assistive device utilized: None Level of assistance: Modified independence Comments: decreased arm swing bilaterally, occasional weaving                                                                                                                            TREATMENT DATE:  11/02/23: Therapeutic Exercise: Nustep level 5, seat at 7, no UEs x 11 min 38 sec, 527 steps (therapist finalized HEP for gym and core exercises and instructed pt as she used the NuStep) Supine pelvic tilts with heel touch downs  with therapist demo and pt return demo x 10 reps with v/c to keep core engaged Hip machine with 10 lbs x 10 reps bilaterally in direction of hip abduction, hip flexion and hip extension Therapeutic Activity: Worked on getting up/down from the floor using large mat beside mat table Initially tried to get pt to half knee and push up without use of large mat table to push up from but pt unable Educated pt to crawl over to couch and get one leg in half knee and then use couch to push up from floor Practiced this  x 3 with pt demonstrating improvement each time - educated pt to hold off on core exercises on floor for now until she becomes more independent getting up/down from floor   10/31/23: Discussed proper way to progress exercises to decrease risk of lymphedema and to avoid injury Had pt send therapist pictures of all machines and exercises and therapist printed each to create a home exercise program specific to the equipment she has available to her at her gym Core Strengthening: Pelvic tilts x 10 reps and 5 sec holds with v/c for form Pelvic tilts with marching x 10 bilaterally Pelvic tilts with hip abduction with v/c to keep pelvis stable throughout x 10 reps Pelvic tilts with straight leg raises x 10 with pt found more challenging Attempted superman but pt had difficulty with balance and pain in wrist, gave dumbbells to help with wrist position but decided to stop exercise  10/24/23 NuStep seat at 7, level 5 x 10 min 16 sec 439 steps  Went through pt's past HEP handouts - she does not have any strengthening handouts currently - she only had ROM Went through pictures of pt's gym equipment to begin to create an HEP that she can follow at the gym Discussed importance of keeping up with reps and resistance at the gym since she over did it and was too sore today to do exercises so session was shortened  10/19/23 NuStep seat at 7, level 5 x 10 min 25 sec 571 steps  Scapular retraction on dual  cable machine x 7 lbs x 2 sets of 15 reps with pt returning therapist demo, then push/pull x 3 lbs x 10 reps bilaterally with pt returning therapist demo (attempted 7lbs but was too much), ER returning therapist demo bilaterally 3 lbs, lat pull down x 7 lbs x 15 reps x 2 sets bilaterally 3 way shoulder raises with 2 lb weights with neck in retraction and axial extension standing against wall with pillow behind head with frequent v/c for posture Standing on 10 inch step slowly lowering leg as if to stand down x 10 bilaterally with pt having increased difficulty with this  10/17/23 Reassessed goals and did functional tests Scapular retraction on dual cable machine x 7 lbs x 2 sets of 10 reps with pt returning therapist demo, then push/pull x 3 lbs x 10 reps bilaterally with pt returning therapist demo (attempted 7lbs but was too much), ER returning therapist demo bilaterally 3 lbs, lat pull down x 7 lbs x 10 reps bilaterally 3 way shoulder raises with 2 lb weights with neck in retraction and axial extension standing against wall with pillow behind head NuStep seat at 7, level 5 x 10 min 25 sec 561 steps   09/12/23:  NuStep seat at 7, level 5 x 10 min 25 sec 539 steps to warm up  Neuro Re Ed: In // bars:  Standing on blue oval air ex on R LE while doing 3 way hip with no resistance but focus on keeping balance with no HHA today or loss of balance x 10 bilaterally Mini squats x 15 reps on air ex pad with no use of // bars for balance Tandem walking on air ex x 6 reps with very little use of finger tips on // bars for balance Hip machine x 45 lbs x 15 reps in direction of flexion, abduction and ER with pt returning therapist demo Leg press seat at 5 x 90 lbs x 20 reps reps Scapular retraction on dual  cable machine x 7 lbs x 20 reps (attempted 10 lbs but it was too much) with pt returning therapist demo, then push/pull x 3 lbs x 20 reps bilaterally with pt returning therapist demo (attempted 7lbs but was  too much), ER returning therapist demo bilaterally 3 lbs, shoulder extension x 3 lbs x 10 reps bilaterally  09/07/23:  NuStep seat at 8, level 5, steps 638 x 10 min 10 sec 438 steps to warm up  Neuro Re Ed: In // bars:  Standing on blue oval air ex on R LE while doing 3 way hip with no resistance but focus on keeping balance with no HHA today x 10 bilaterally Tandem walking on air ex x 6 reps with minimal use of // bars Step ups on 6 in step: step up/up then down/down then stepping up and over and then stepping backwards up and over all x 10 each Hip machine x 40 lbs x 10 reps in direction of flexion, abduction and ER with pt returning therapist demo Leg press seat at 5 x 90 lbs x 2 sets of 10 reps reps Standing with purple ball behind head doing chin tucks while performing 3 way shoulder with 1 lbs weight x 10 reps in each direction Scapular retraction on dual cable machine x 7 lbs x 10 reps with pt returning therapist demo, then push/pull x 3 lbs x 10 reps bilaterally with pt returning therapist demo In supine over 1/2 foam roll: horizontal abduction x 10 to stretch chest, snow angels x 10 with pt very limited in ROM due to tightness, lying with arms outstretched x 3 min  09/05/23:  Neuro Re Ed: In // bars:  Standing on blue oval air ex on R LE while doing 3 way hip using 2 lb ankle weight on L LE to keep movement slow and controlled throughout with hand held assist to maintain balance x 10 reps in all directions then repeated on opposite side  Tandem walking on air ex x 6 reps  Step ups on 6 in step: step up/up then down/down then stepping up and over and then stepping backwards up and over all x 10 each NuStep seat at 8, level 4, steps 638 x 10 min 7 sec to warm up with v/c to avoid hip adduction while educating pt on awareness of posture and neck retraction exercise to help with posture Standing on air ex: mini squats x 10 with v/c to avoid hip adduction with no HHA Hip machine x 25 lbs x 10  reps in direction of flexion, abduction and ER with pt returning therapist demo Leg press seat at 5 x 70 lbs x 20 reps Standing with purple ball behind head doing chin tucks while performing 3 way shoulder with 1 lbs weight x 10 reps in each direction Scapular retraction on dual cable machine x 7 lbs x 10 reps with pt returning therapist demo In supine over 1/2 foam roll: horizontal abduction x 10 to stretch chest, snow angels x 10 with pt very limited in ROM due to tightness, lying with arms outstretched x 3 min  08/30/23: NuStep seat at 8, level 3, steps 658 for 10:13 to warm up with v/c to avoid hip adduction while educating pt on awareness of posture and neck retraction exercise to help with posture Neuro Re Ed: In // bars:  Standing on blue oval air ex on R LE while doing 3 way hip on L LE to keep movement slow and controlled throughout with  hand held assist to maintain balance x 10 reps in all directions then repeated on opposite side  Tandem walking on air ex x 6 reps  Step ups on 6 in step: step up/up then down/down then stepping up and over and then stepping backwards up and over all x 10 each Standing on air ex: mini squats x 10 with v/c to avoid hip adduction Practicing ascending/descending 1 flight of steps first with 1 HHA then with no HHA with v/c on form and importance of activation of muscles in feet for balance 08/29/23- none today due to time constraints   PATIENT EDUCATION:  Education details: goals for therapy, how she is currently vs at d/c last episode Person educated: Patient Education method: Explanation Education comprehension: verbalized understanding  HOME EXERCISE PROGRAM: Access Code: [email or web address] URL: [email or web address] Date: 11/02/2023 Prepared by: Florina Lanis Carbon  Exercises - Forearm Walks on Wall with Resistance Band  - 1 x daily - 3-4 x weekly - 10 reps - Serratus Forearm Push Up Plus at Wall with Elbows  - 1 x daily - 3-4 x weekly -  10 reps - Diaphragmatic Breathing with Pelvic Tilt in [place]  - 1 x daily - 3-4 x weekly - 1-3 sets - 10 reps - Supine March with Posterior Pelvic Tilt  - 1 x daily - 3-4 x weekly - 1-3 sets - 10 reps - Supine Pelvic Tilt with Straight Leg Raise  - 1 x daily - 3-4 x weekly - 1-3 sets - 10 reps - Supine 90/90 Alternating Heel Touches with Posterior Pelvic Tilt  - 1 x daily - 3-4 x weekly - 1-3 sets - 10 reps - Supine Posterior Pelvic Tilt with Knee Rocks  - 1 x daily - 3-4 x weekly - 1-3 sets - 10 reps  Also created an HEP with weight machines at pt's gym with instructions  ASSESSMENT:  CLINICAL IMPRESSION: Finalized pt's HEP for the gym where she lives with included pictures of her equipment and instructions for how to progress exercises slowly and to reduce risk of lymphedema. Also created HEP with walking program and core strengthening. Began working with pt on getting up/down from the floor as she is fearful if she falls she won't be able to get up. She would also benefit from doing her core exercises on the floor where the surface is more firm.   OBJECTIVE IMPAIRMENTS: difficulty walking, decreased strength, increased fascial restrictions, impaired flexibility, and postural dysfunction.   ACTIVITY LIMITATIONS: squatting, stairs, bed mobility, and locomotion level  PARTICIPATION LIMITATIONS: shopping and community activity  PERSONAL FACTORS: Age, Fitness, and Time since onset of injury/illness/exacerbation are also affecting patient's functional outcome, visual disturbances.   REHAB POTENTIAL: Good  CLINICAL DECISION MAKING: Evolving/moderate complexity  EVALUATION COMPLEXITY: Moderate  GOALS: Goals reviewed with patient? Yes  SHORT TERM GOALS: Target date: 09/12/23  Pt will be independent with initial HEP for stretching and strengthening.  Baseline: Goal status: IN PROGRESS  2.  Pt will be able to complete 12 sit to stands in 30 sec without use of UEs to decrease fall risk.   Baseline:  Goal status: IN PROGRESS 10/17/23- pt is able to complete 10 in 30 sec  LONG TERM GOALS: Target date: 09/26/23  Pt will be able to complete 13 sit to stands in 30 sec without use of UEs to decrease fall risk. Baseline:  Goal status: IN PROGRESS 10/17/23- 10 reps  2.  Pt will demonstrate 5/5 bilateral hip flexor strength to decrease fall  risk.  Baseline:  Goal status: MET 10/17/23 bilaterally  3.  Pt will score at least 51 on the BERG balance to decrease fall risk.  Baseline:  Goal status: IN PROGRESS 10/17/23  4.  Pt will report she is able to go down a flight a steps without fear of falling to decrease risk of falling.  Baseline:  Goal status: IN PROGRESS 10/17/23- still fearful of falling, pt still has decreased SLS time  5.  Pt will be independent in a final home exercise program for continued stretching and strengthening independently.  Baseline:  Goal status: IN PROGRESS   PLAN:  PT FREQUENCY: 2x/week  PT DURATION: 4 weeks  PLANNED INTERVENTIONS: 97164- PT Re-evaluation, 97750- Physical Performance Testing, 97110-Therapeutic exercises, 97530- Therapeutic activity, [id number]- Neuromuscular re-education, 97535- Self Care, [id number]- Manual therapy, and 97116- Gait training  PLAN FOR NEXT SESSION: continue practicing getting up/down from floor, continue to progress UE and LE strengthening exercises. work on [employer] and narrow base of support, hip strengthening, pec stretching, posture    [employer], PT 11/02/2023, 10:02 AM

## 2023-11-03 ENCOUNTER — Telehealth: Payer: Self-pay | Admitting: Genetic Counselor

## 2023-11-03 NOTE — Telephone Encounter (Signed)
 I messaged Dr. Gudena to let them know that Jessica [name] is recommended to have genetic testing due to their personal history of triple negative breast cancer.  Honesty Menta, MS, [facility] Genetic Counselor [email or web address] (P) [phone]

## 2023-11-07 ENCOUNTER — Ambulatory Visit: Admitting: Physical Therapy

## 2023-11-09 ENCOUNTER — Encounter: Admitting: Physical Therapy

## 2023-11-13 ENCOUNTER — Ambulatory Visit: Admitting: Internal Medicine

## 2023-11-13 ENCOUNTER — Encounter: Payer: Self-pay | Admitting: Physical Therapy

## 2023-11-13 DIAGNOSIS — M810 Age-related osteoporosis without current pathological fracture: Secondary | ICD-10-CM | POA: Diagnosis not present

## 2023-11-13 MED ORDER — DENOSUMAB 60 MG/ML ~~LOC~~ SOSY: 60.0000 mg | PREFILLED_SYRINGE | SUBCUTANEOUS | Status: AC

## 2023-11-13 NOTE — Progress Notes (Unsigned)
 Patient is in office today for a nurse visit for  Prolia Injection . Patient Injection was given in the  Right arm. Patient tolerated injection well.

## 2023-11-14 ENCOUNTER — Ambulatory Visit: Admitting: Physical Therapy

## 2023-11-22 ENCOUNTER — Ambulatory Visit: Attending: Hematology and Oncology | Admitting: Physical Therapy

## 2023-11-22 DIAGNOSIS — Z171 Estrogen receptor negative status [ER-]: Secondary | ICD-10-CM | POA: Diagnosis not present

## 2023-11-22 DIAGNOSIS — R262 Difficulty in walking, not elsewhere classified: Secondary | ICD-10-CM | POA: Diagnosis not present

## 2023-11-22 DIAGNOSIS — C50412 Malignant neoplasm of upper-outer quadrant of left female breast: Secondary | ICD-10-CM | POA: Diagnosis not present

## 2023-11-22 DIAGNOSIS — Z483 Aftercare following surgery for neoplasm: Secondary | ICD-10-CM | POA: Insufficient documentation

## 2023-11-22 DIAGNOSIS — R293 Abnormal posture: Secondary | ICD-10-CM | POA: Insufficient documentation

## 2023-11-22 DIAGNOSIS — M6281 Muscle weakness (generalized): Secondary | ICD-10-CM | POA: Insufficient documentation

## 2023-11-22 NOTE — Therapy (Addendum)
 OUTPATIENT PHYSICAL THERAPY ONCOLOGY TREATMENT  Patient Name: Jessica [name] MRN: [id number] DOB:12/14/1940, 83 y.o., female Today's Date: 11/22/2023  END OF SESSION:  PT End of Session - 11/22/23 1042     Visit Number 11    Number of Visits 12    Date for PT Re-Evaluation 03/13/24    PT Start Time 1001    PT Stop Time 1040    PT Time Calculation (min) 39 min    Activity Tolerance Patient tolerated treatment well    Behavior During Therapy [facility] for tasks assessed/performed                Past Medical History:  Diagnosis Date   Anemia 12/2015   Bronchitis, chronic (HCC)    in her 43s   Cancer (HCC)    breast   Cataract    Chronic kidney disease    in 20s had nephritis   Closed fracture of right distal humerus    right   Coronary artery disease    Elevated Lp(a)    Hand, foot and mouth disease (HFMD)    Hearing loss 05/03/2016   High cholesterol    Hypothyroidism    Myocardial infarction (HCC)    Neuropathy    toes   Osteopenia    Pericarditis 01/1999   Pneumothorax 01/1999   Thyroid  disorder    Vitamin B 12 deficiency    Past Surgical History:  Procedure Laterality Date   birthmark      removed,left leg   BREAST BIOPSY Left 09/13/2022   US  LT BREAST BX W LOC DEV 1ST LESION IMG BX SPEC US  GUIDE 09/13/2022 [facility] MAMMOGRAPHY   CARDIAC CATHETERIZATION     CATARACT EXTRACTION  2017   CORONARY STENT INTERVENTION N/A 09/01/2020   Procedure: CORONARY STENT INTERVENTION;  Surgeon: Claudene Victory ORN, MD;  Location: [facility];  Service: Cardiovascular;  Laterality: N/A;   LEFT HEART CATH AND CORONARY ANGIOGRAPHY N/A 09/01/2020   Procedure: LEFT HEART CATH AND CORONARY ANGIOGRAPHY;  Surgeon: Claudene Victory ORN, MD;  Location: [facility];  Service: Cardiovascular;  Laterality: N/A;   ORIF HUMERUS FRACTURE Right 04/10/2019   Procedure: RIGHT OPEN REDUCTION INTERNAL FIXATION (ORIF) DISTAL HUMERUS FRACTURE WITH EXTENSION;  Surgeon: Cristy Bonner DASEN, MD;   Location: [facility];  Service: Orthopedics;  Laterality: Right;   PORTACATH PLACEMENT N/A 11/15/2022   Procedure: PORT PLACEMENT WITH ULTRASOUND GUIDANCE;  Surgeon: Vanderbilt Ned, MD;  Location: [facility];  Service: General;  Laterality: N/A;   SIMPLE MASTECTOMY WITH AXILLARY SENTINEL NODE BIOPSY Bilateral 10/18/2022   Procedure: BILATERAL SIMPLE MASTECTOMY;  Surgeon: Vanderbilt Ned, MD;  Location: [facility];  Service: General;  Laterality: Bilateral;  PEC BLOCK   TONSILLECTOMY AND ADENOIDECTOMY  1952?   UMBILICAL HERNIA REPAIR N/A 09/20/2023   Procedure: REPAIR, HERNIA, UMBILICAL, ADULT;  Surgeon: Lyndel Deward PARAS, MD;  Location: [facility];  Service: General;  Laterality: N/A;   Patient Active Problem List   Diagnosis Date Noted   Blurry vision, bilateral 08/30/2023   Paresthesia 08/30/2023   Malignant neoplasm of upper-outer quadrant of left breast in female, estrogen receptor negative (HCC) 09/21/2022   Atherosclerotic heart disease of native coronary artery with other forms of angina pectoris (HCC) 04/08/2022   Swelling of left foot 04/07/2022   Left hand fracture, sequela 04/07/2022   Depression with anxiety 04/07/2022   NSTEMI (non-ST elevated myocardial infarction) (HCC) 09/01/2020   Closed fracture of right distal humerus 04/10/2019  Dyspnea 05/09/2018   Vitamin B 12 deficiency 05/09/2018   Cough 03/06/2018   Senile osteoporosis 11/20/2017   Iron deficiency 04/26/2017   Lichen sclerosus 04/26/2017   External hemorrhoid 04/26/2017   Weakness of left leg 06/07/2016   Balance disorder 05/24/2016   Memory deficit 05/24/2016   Anemia 05/19/2016   Hearing loss 05/03/2016   Hypothyroidism 07/24/2013   Bradycardia on ECG 07/24/2013   Numbness 08/07/2012   HYPERLIPIDEMIA 12/29/2006    PCP: Fredia Bring, MD  REFERRING PROVIDER: Odean Potts, MD  REFERRING DIAG: R26.89 (ICD-10-CM) - Balance problem  THERAPY DIAG:  Muscle weakness  (generalized)  Difficulty in walking, not elsewhere classified  Abnormal posture  Malignant neoplasm of upper-outer quadrant of left breast in female, estrogen receptor negative (HCC)  ONSET DATE: 10/18/22  Rationale for Evaluation and Treatment: Rehabilitation  SUBJECTIVE:                                                                                                                                                                                           SUBJECTIVE STATEMENT: I did some heavy furniture rearranging after my last appointment and I have not felt well. I have lost so much energy. I am achy. My hip is bothering me. The water exercise is too much. I got the [employer] and tried to get off the floor. It was hard to get off the floor. I did this after the furniture moving. I could not do sit to stands at all but now I can do it sometimes. I am still very unbalanced. I feel this great need to rest. I am not getting good sleep.   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 She underwent a bilateral mastectomy and left sentinel node biopsy (7 negative nodes) on 10/18/2022. It is triple negative with a Ki67 of 5%. She has some cardiac history and had an elbow fracture in 2022 resulting in surgery where pins were placed.   PAIN:  Are you having pain? Yes  upper R hip area, 1/10, getting up makes it worse, leaning forward/curling up makes it feel better, gets better in the day time but gets worse with walking for a while  PRECAUTIONS: Other: L UE lymphedema risk  WEIGHT BEARING RESTRICTIONS: No  FALLS:  Has patient fallen in last 6 months? No  LIVING ENVIRONMENT: Lives with: lives alone but at [facility] Lives in: House/apartment Stairs: No Has following equipment at home: Single point cane  OCCUPATION: retired  LEISURE: goes to water exercise 1x/wk, tries to do some walking  HAND DOMINANCE: right   PRIOR  LEVEL OF FUNCTION: Independent  PATIENT GOALS: to become more stable, to have more strength   OBJECTIVE: Note: Objective measures were completed at Evaluation unless otherwise noted.  COGNITION: Overall cognitive status: Within functional limits for tasks assessed   POSTURE: forward head, rounded shoulders  LOWER EXTREMITY STRENGTH:   MMT Right Eval on 12/05/22 RIGHT  08/29/23 on EVAL RIGHT 10/17/23 RIGHT 11/22/23  Hip flexion 5/5 4/5 5/5 4/5  Hip extension 4/5 4+/5 5/5 2+/5  Hip abduction 4/5 5/5 5/5 5/5  Hip adduction       Hip internal rotation       Hip external rotation       Knee flexion 5/5 5/5 5/5 5/5  Knee extension 5/5 5/5 5/5 5/5  Ankle dorsiflexion 5/5 5/5  5/5  Ankle plantarflexion       Ankle inversion       Ankle eversion        (Blank rows = not tested)   A/PROM LEFT Eval on 12/05/22 LEFT 08/29/23 EVAL LEFT 10/17/23 LEFT 11/22/23  Hip flexion 5/5 4/5 5/5 3+/5  Hip extension 3/5 4/5 3+/5 2+/5  Hip abduction 4+/5 5/5 5/5 3/5  Hip adduction       Hip internal rotation       Hip external rotation       Knee flexion 5/5 5/5 5/5   Knee extension 5/5 5/5 5/5   Ankle dorsiflexion 5/5 5/5    Ankle plantarflexion       Ankle inversion       Ankle eversion        (Blank rows = not tested)   FUNCTIONAL TEST: 30 SEC SIT TO STAND: 11/22/23: 30 sec sit to stand: 8 reps without use of UEs which is below average for her age  24/12/25: 30 sec sit to stand: 10 reps without use of UEs which is average for her age      08/29/23: 30 SEC SIT TO STAND: 11 reps which is good for her age 54/25/24: 30 SEC SIT TO STAND: 13 reps which is below average for her age  71 BALANCE TEST 08/29/23: BERG balance test: 48/56, anything less than 45 is a greater risk of falling 01/05/23: BERG Balance Test: 51/56, anything less than 45 is a greater risk of falling  SINGLE LIMB STANCE 11/22/23: SLS on R 13 sec, SLS on L: 6 sec 10/17/23: SLS on R: 10 sec, SLS on L: 5 sec  4 SQUARE STEP  TEST 4 square step test: 12.72 sec  GAIT: Distance walked: 20 feet Assistive device utilized: None Level of assistance: Modified independence Comments: decreased arm swing bilaterally, occasional weaving                                                                                                                            TREATMENT DATE:  11/22/23: Retested strength Retested sit to stand Retested SLS See Assessment for more information - Self Care discussion on need to  address fatigue and hip pain issues  11/02/23: Therapeutic Exercise: Nustep level 5, seat at 7, no UEs x 11 min 38 sec, 527 steps (therapist finalized HEP for gym and core exercises and instructed pt as she used the NuStep) Supine pelvic tilts with heel touch downs with therapist demo and pt return demo x 10 reps with v/c to keep core engaged Hip machine with 10 lbs x 10 reps bilaterally in direction of hip abduction, hip flexion and hip extension Therapeutic Activity: Worked on getting up/down from the floor using large mat beside mat table Initially tried to get pt to half knee and push up without use of large mat table to push up from but pt unable Educated pt to crawl over to couch and get one leg in half knee and then use couch to push up from floor Practiced this x 3 with pt demonstrating improvement each time - educated pt to hold off on core exercises on floor for now until she becomes more independent getting up/down from floor   10/31/23: Discussed proper way to progress exercises to decrease risk of lymphedema and to avoid injury Had pt send therapist pictures of all machines and exercises and therapist printed each to create a home exercise program specific to the equipment she has available to her at her gym Core Strengthening: Pelvic tilts x 10 reps and 5 sec holds with v/c for form Pelvic tilts with marching x 10 bilaterally Pelvic tilts with hip abduction with v/c to keep pelvis stable throughout x 10  reps Pelvic tilts with straight leg raises x 10 with pt found more challenging Attempted superman but pt had difficulty with balance and pain in wrist, gave dumbbells to help with wrist position but decided to stop exercise  10/24/23 NuStep seat at 7, level 5 x 10 min 16 sec 439 steps  Went through pt's past HEP handouts - she does not have any strengthening handouts currently - she only had ROM Went through pictures of pt's gym equipment to begin to create an HEP that she can follow at the gym Discussed importance of keeping up with reps and resistance at the gym since she over did it and was too sore today to do exercises so session was shortened  10/19/23 NuStep seat at 7, level 5 x 10 min 25 sec 571 steps  Scapular retraction on dual cable machine x 7 lbs x 2 sets of 15 reps with pt returning therapist demo, then push/pull x 3 lbs x 10 reps bilaterally with pt returning therapist demo (attempted 7lbs but was too much), ER returning therapist demo bilaterally 3 lbs, lat pull down x 7 lbs x 15 reps x 2 sets bilaterally 3 way shoulder raises with 2 lb weights with neck in retraction and axial extension standing against wall with pillow behind head with frequent v/c for posture Standing on 10 inch step slowly lowering leg as if to stand down x 10 bilaterally with pt having increased difficulty with this  10/17/23 Reassessed goals and did functional tests Scapular retraction on dual cable machine x 7 lbs x 2 sets of 10 reps with pt returning therapist demo, then push/pull x 3 lbs x 10 reps bilaterally with pt returning therapist demo (attempted 7lbs but was too much), ER returning therapist demo bilaterally 3 lbs, lat pull down x 7 lbs x 10 reps bilaterally 3 way shoulder raises with 2 lb weights with neck in retraction and axial extension standing against wall with pillow behind head  NuStep seat at 7, level 5 x 10 min 25 sec 561 steps   09/12/23:  NuStep seat at 7, level 5 x 10 min 25 sec 539 steps  to warm up  Neuro Re Ed: In // bars:  Standing on blue oval air ex on R LE while doing 3 way hip with no resistance but focus on keeping balance with no HHA today or loss of balance x 10 bilaterally Mini squats x 15 reps on air ex pad with no use of // bars for balance Tandem walking on air ex x 6 reps with very little use of finger tips on // bars for balance Hip machine x 45 lbs x 15 reps in direction of flexion, abduction and ER with pt returning therapist demo Leg press seat at 5 x 90 lbs x 20 reps reps Scapular retraction on dual cable machine x 7 lbs x 20 reps (attempted 10 lbs but it was too much) with pt returning therapist demo, then push/pull x 3 lbs x 20 reps bilaterally with pt returning therapist demo (attempted 7lbs but was too much), ER returning therapist demo bilaterally 3 lbs, shoulder extension x 3 lbs x 10 reps bilaterally  09/07/23:  NuStep seat at 8, level 5, steps 638 x 10 min 10 sec 438 steps to warm up  Neuro Re Ed: In // bars:  Standing on blue oval air ex on R LE while doing 3 way hip with no resistance but focus on keeping balance with no HHA today x 10 bilaterally Tandem walking on air ex x 6 reps with minimal use of // bars Step ups on 6 in step: step up/up then down/down then stepping up and over and then stepping backwards up and over all x 10 each Hip machine x 40 lbs x 10 reps in direction of flexion, abduction and ER with pt returning therapist demo Leg press seat at 5 x 90 lbs x 2 sets of 10 reps reps Standing with purple ball behind head doing chin tucks while performing 3 way shoulder with 1 lbs weight x 10 reps in each direction Scapular retraction on dual cable machine x 7 lbs x 10 reps with pt returning therapist demo, then push/pull x 3 lbs x 10 reps bilaterally with pt returning therapist demo In supine over 1/2 foam roll: horizontal abduction x 10 to stretch chest, snow angels x 10 with pt very limited in ROM due to tightness, lying with arms  outstretched x 3 min  09/05/23:  Neuro Re Ed: In // bars:  Standing on blue oval air ex on R LE while doing 3 way hip using 2 lb ankle weight on L LE to keep movement slow and controlled throughout with hand held assist to maintain balance x 10 reps in all directions then repeated on opposite side  Tandem walking on air ex x 6 reps  Step ups on 6 in step: step up/up then down/down then stepping up and over and then stepping backwards up and over all x 10 each NuStep seat at 8, level 4, steps 638 x 10 min 7 sec to warm up with v/c to avoid hip adduction while educating pt on awareness of posture and neck retraction exercise to help with posture Standing on air ex: mini squats x 10 with v/c to avoid hip adduction with no HHA Hip machine x 25 lbs x 10 reps in direction of flexion, abduction and ER with pt returning therapist demo Leg press seat at 5 x  70 lbs x 20 reps Standing with purple ball behind head doing chin tucks while performing 3 way shoulder with 1 lbs weight x 10 reps in each direction Scapular retraction on dual cable machine x 7 lbs x 10 reps with pt returning therapist demo In supine over 1/2 foam roll: horizontal abduction x 10 to stretch chest, snow angels x 10 with pt very limited in ROM due to tightness, lying with arms outstretched x 3 min  08/30/23: NuStep seat at 8, level 3, steps 658 for 10:13 to warm up with v/c to avoid hip adduction while educating pt on awareness of posture and neck retraction exercise to help with posture Neuro Re Ed: In // bars:  Standing on blue oval air ex on R LE while doing 3 way hip on L LE to keep movement slow and controlled throughout with hand held assist to maintain balance x 10 reps in all directions then repeated on opposite side  Tandem walking on air ex x 6 reps  Step ups on 6 in step: step up/up then down/down then stepping up and over and then stepping backwards up and over all x 10 each Standing on air ex: mini squats x 10 with v/c to  avoid hip adduction Practicing ascending/descending 1 flight of steps first with 1 HHA then with no HHA with v/c on form and importance of activation of muscles in feet for balance 08/29/23- none today due to time constraints   PATIENT EDUCATION:  Education details: goals for therapy, how she is currently vs at d/c last episode Person educated: Patient Education method: Explanation Education comprehension: verbalized understanding  HOME EXERCISE PROGRAM: Access Code: [email or web address] URL: [email or web address] Date: 11/02/2023 Prepared by: Florina Lanis Carbon  Exercises - Forearm Walks on Wall with Resistance Band  - 1 x daily - 3-4 x weekly - 10 reps - Serratus Forearm Push Up Plus at Wall with Elbows  - 1 x daily - 3-4 x weekly - 10 reps - Diaphragmatic Breathing with Pelvic Tilt in [place]  - 1 x daily - 3-4 x weekly - 1-3 sets - 10 reps - Supine March with Posterior Pelvic Tilt  - 1 x daily - 3-4 x weekly - 1-3 sets - 10 reps - Supine Pelvic Tilt with Straight Leg Raise  - 1 x daily - 3-4 x weekly - 1-3 sets - 10 reps - Supine 90/90 Alternating Heel Touches with Posterior Pelvic Tilt  - 1 x daily - 3-4 x weekly - 1-3 sets - 10 reps - Supine Posterior Pelvic Tilt with [place]nee [place]  - 1 x daily - 3-4 x weekly - 1-3 sets - 10 reps  Also created an HEP with weight machines at pt's gym with instructions  ASSESSMENT:  CLINICAL IMPRESSION: Pt returns to PT after cancelling multiple visits. She reports after her last session she rearranged furniture and was so fatigued she was unable to come to therapy. She also attempted to get up off the floor at home and had difficulty with this. She has been having nagging R hip pain now for weeks that has worsened at time. She reports increased fatigue to the point she does not feel like coming to appointments. This has worsened since her eval. Since her eval her 30 sec sit to stand has decreased every time it was tested. She also has increased  weakness in bilateral LEs since she began therapy. Educated pt on importance of following pt on following up with her doctor regarding her increased fatigue  to see what is the cause of this. Educated pt to also see her cardiologist to see if this could be causing her fatigue. Encouraged pt to see an ortho doctor to have her hip evaluated. Once pt is cleared by her doctors encouraged pt to return to therapy for continued strengthening. Discussed need to hold therapy for now to avoid any injury to her hip since the cause of the pain is unknown and also to have her fatigue evaluated. Pt felt the fatigue was normal for older age but discussed that her level of fatigue is not normal and is something that has changed recently. Pt would benefit from another skilled PT visit to reassess once she is cleared by her doctors.   OBJECTIVE IMPAIRMENTS: difficulty walking, decreased strength, increased fascial restrictions, impaired flexibility, and postural dysfunction.   ACTIVITY LIMITATIONS: squatting, stairs, bed mobility, and locomotion level  PARTICIPATION LIMITATIONS: shopping and community activity  PERSONAL FACTORS: Age, Fitness, and Time since onset of injury/illness/exacerbation are also affecting patient's functional outcome, visual disturbances.   REHAB POTENTIAL: Good  CLINICAL DECISION MAKING: Evolving/moderate complexity  EVALUATION COMPLEXITY: Moderate  GOALS: Goals reviewed with patient? Yes  SHORT TERM GOALS: Target date: 09/12/23  Pt will be independent with initial HEP for stretching and strengthening.  Baseline: Goal status: MET   2.  Pt will be able to complete 12 sit to stands in 30 sec without use of UEs to decrease fall risk.  Baseline:  Goal status: IN PROGRESS 10/17/23- pt is able to complete 10 in 30 sec  LONG TERM GOALS: Target date: 09/26/23  Pt will be able to complete 13 sit to stands in 30 sec without use of UEs to decrease fall risk. Baseline:  Goal status: IN  PROGRESS 10/17/23- 10 reps, 11/22/23: 8 reps in 30 sec  2.  Pt will demonstrate 5/5 bilateral hip flexor strength to decrease fall risk.  Baseline:  Goal status: IN PROGRESS 10/17/23 bilaterally was 5/5 but changed back to in progress due to recent worsening of weakness  3.  Pt will score at least 51 on the BERG balance to decrease fall risk.  Baseline:  Goal status: IN PROGRESS 10/17/23  4.  Pt will report she is able to go down a flight a steps without fear of falling to decrease risk of falling.  Baseline:  Goal status: IN PROGRESS9/17/25- pt still fearful of falling, 10/17/23- still fearful of falling, pt still has decreased SLS time  5.  Pt will be independent in a final home exercise program for continued stretching and strengthening independently.  Baseline:  Goal status: IN PROGRESS   PLAN:  PT FREQUENCY: 1 visit within 16 weeks to reassess once cleared by her doctors  PT DURATION: other: 16 weeks  PLANNED INTERVENTIONS: 97164- PT Re-evaluation, 97750- Physical Performance Testing, 97110-Therapeutic exercises, 97530- Therapeutic activity, [id number]- Neuromuscular re-education, 97535- Self Care, [id number]- Manual therapy, and 97116- Gait training  PLAN FOR NEXT SESSION: reassess after clearance from doctors, continue practicing getting up/down from floor, continue to progress UE and LE strengthening exercises. work on [employer] and narrow base of support, hip strengthening, pec stretching, posture    [employer], PT 11/22/2023, 10:53 AM  PHYSICAL THERAPY DISCHARGE SUMMARY  Visits from Start of Care: 11  Current functional level related to goals / functional outcomes: See above   Remaining deficits: See above   Education / Equipment: HEP   Patient agrees to discharge. Patient goals were not met. Patient is being discharged due to not  returning since the last visit.  Florina Sever Grady, [place] 02/13/24 11:47 AM

## 2023-11-23 ENCOUNTER — Encounter: Payer: Self-pay | Admitting: Cardiovascular Disease

## 2023-11-23 NOTE — Progress Notes (Unsigned)
 OFFICE NOTE:    Date:  11/24/2023  ID:  Jessica [name], DOB 11-Nov-1940, MRN [id number] PCP: Charlanne Fredia CROME, MD  [facility] Providers Cardiologist:  Ozell Fell, MD        Coronary artery disease  NSTEMI in 08/2020 s/p 2 x 22 mm DES to LAD  LHC 09/01/2020: LAD mid 99, distal 75; RCA proximal 40, 50 Ischemic CM TTE 09/01/2020: EF 40-45 TTE 10/07/2020: EF 50-55  TTE 08/29/2022: EF 60-65, no RWMA, normal RVSF SP 23.6, trivial MR, trivial AI Hypertension  Hyperlipidemia  Pericarditis in 2000 Breast CA  Hypothyroidism  Hx of stroke MRI 09/2023: mild age related changes; chronic infarct anterior R basal ganglia, no acute CVA  Carotid US  09/18/2023: No significant ICA stenosis bilaterally        Discussed the use of AI scribe software for clinical note transcription with the patient, who gave verbal consent to proceed. History of Present Illness Jessica [name] is a 83 y.o. female who returns for evaluation of fatigue. She was last seen in 08/2023 by Mercy Hails, PA-C for surgical clearance prior to umbilical herniorrhaphy. Pt noted transient blurry vision. Symptoms were concerning for TIA. She was referred to Neuro. she saw Dr. Onita.  Cardiac MRI demonstrated old, chronic infarct but nothing acute.  Carotid ultrasound was negative for significant ICA stenosis.  She had her umbilical hernia surgery in July 2025  She is here for weakness and balance issues. She was advised by her physical therapist to seek medical evaluation due to a steady decline in strength. She has experienced a significant decrease in strength and stamina following an episode of heavy furniture moving two weeks ago. Since then, she has persistent weakness, particularly in her legs, and a general lack of stamina. She describes feeling 'very wobbly' and notes that her balance is 'terrible'.  She has not had chest pain, pressure, heaviness, or tightness, and no shortness of breath when lying flat. No episodes of  passing out or feeling close to passing out. There is no swelling in her legs, no changes in weight, and no changes in skin or hair texture. Her bowel movements are normal, and she denies any bloody or black stools.    ROS-See HPI    Studies Reviewed:  EKG Interpretation Date/Time:  Friday November 24 2023 12:24:13 EDT Ventricular Rate:  58 PR Interval:  162 QRS Duration:  72 QT Interval:  450 QTC Calculation: 441 R Axis:   14  Text Interpretation: Sinus bradycardia When compared with ECG of 17-Aug-2023 15:06, No significant change was found Confirmed by Lelon Hamilton [phone]) on 11/24/2023 12:27:08 PM    Labs-chart review 09/07/2023: K 4.1, creatinine 0.59, ALT 14, total cholesterol 131, HDL 52, LDL 61, triglycerides 96, Hgb 13.3 Results         Physical Exam:  VS:  BP 120/70   Pulse 68   Ht 5' 3 (1.6 m)   Wt 152 lb 12.8 oz (69.3 kg)   SpO2 97%   BMI 27.07 kg/m        Wt Readings from Last 3 Encounters:  11/24/23 152 lb 12.8 oz (69.3 kg)  09/20/23 154 lb 1.6 oz (69.9 kg)  09/13/23 154 lb 1.6 oz (69.9 kg)    Constitutional:      Appearance: Healthy appearance. Not in distress.  Neck:     Vascular: No carotid bruit. JVD normal.  Pulmonary:     Breath sounds: Normal breath sounds. No wheezing. No rales.  Cardiovascular:     Normal rate. Regular rhythm.     Murmurs: There is no murmur.  Edema:    Peripheral edema absent.  Abdominal:     Palpations: Abdomen is soft.        Assessment and Plan:    Assessment & Plan Other fatigue Coronary artery disease involving native coronary artery of native heart without angina pectoris Ischemic cardiomyopathy History of non-STEMI in 2022 treated with a DES to the LAD.  She had ischemic cardiomyopathy with EF 40-45.  This is improved to normal. She has noted fatigue over the past 2 weeks. She seems to be describing exercise intolerance. She has not had chest pain or significant shortness of breath but question if her symptoms  could be an anginal equivalent. She has recorded HRs 80-100s with walking on her Apple watch. Therefore, I don't think she has chronotropic incompetence. Her EKG shows sinus rhythm and no evidence of heart block.  She has not had syncope.  Blood pressure seems to be stable.  Etiology not entirely clear.  I have recommended proceeding with stress testing and echocardiogram to rule out cardiac causes.  I have also recommended obtaining blood work to rule out thyroid  disease, anemia or metabolic disorder.  I have also recommended that she reach out to primary care for continued evaluation. -Arrange echocardiogram -Arrange Lexiscan MPI -CMET, CBC, TSH -Arrange follow-up with primary care -Follow-up with cardiology 6 months -Continue Plavix  75 mg daily, nitroglycerin  as needed, rosuvastatin  20 mg daily Pure hypercholesterolemia Labs in July 2025 demonstrated LDL 61.  LDL optimal.  -Continue rosuvastatin  20 mg daily.  Informed Consent   Shared Decision Making/Informed Consent The risks [chest pain, shortness of breath, cardiac arrhythmias, dizziness, blood pressure fluctuations, myocardial infarction, stroke/transient ischemic attack, nausea, vomiting, allergic reaction, radiation exposure, metallic taste sensation and life-threatening complications (estimated to be 1 in 10,000)], benefits (risk stratification, diagnosing coronary artery disease, treatment guidance) and alternatives of a nuclear stress test were discussed in detail with Ms. Bordeau and she agrees to proceed.            Dispo:  Return in about 6 months (around 05/23/2024) for Routine Follow Up, w/ Dr. Wonda.  Signed, Glendia Ferrier, PA-C

## 2023-11-23 NOTE — Assessment & Plan Note (Signed)
 History of non-STEMI in 2022 treated with a DES to the LAD.  She had ischemic cardiomyopathy with EF 40-45.  This is improved to normal.***

## 2023-11-24 ENCOUNTER — Encounter: Payer: Self-pay | Admitting: Physician Assistant

## 2023-11-24 ENCOUNTER — Ambulatory Visit: Attending: Physician Assistant | Admitting: Physician Assistant

## 2023-11-24 VITALS — BP 120/70 | HR 68 | Ht 63.0 in | Wt 152.8 lb

## 2023-11-24 DIAGNOSIS — E785 Hyperlipidemia, unspecified: Secondary | ICD-10-CM | POA: Diagnosis not present

## 2023-11-24 DIAGNOSIS — I252 Old myocardial infarction: Secondary | ICD-10-CM | POA: Insufficient documentation

## 2023-11-24 DIAGNOSIS — Z955 Presence of coronary angioplasty implant and graft: Secondary | ICD-10-CM | POA: Diagnosis not present

## 2023-11-24 DIAGNOSIS — I2510 Atherosclerotic heart disease of native coronary artery without angina pectoris: Secondary | ICD-10-CM | POA: Diagnosis not present

## 2023-11-24 DIAGNOSIS — Z8673 Personal history of transient ischemic attack (TIA), and cerebral infarction without residual deficits: Secondary | ICD-10-CM | POA: Insufficient documentation

## 2023-11-24 DIAGNOSIS — Z7902 Long term (current) use of antithrombotics/antiplatelets: Secondary | ICD-10-CM | POA: Insufficient documentation

## 2023-11-24 DIAGNOSIS — I10 Essential (primary) hypertension: Secondary | ICD-10-CM | POA: Diagnosis not present

## 2023-11-24 DIAGNOSIS — E039 Hypothyroidism, unspecified: Secondary | ICD-10-CM | POA: Diagnosis not present

## 2023-11-24 DIAGNOSIS — R5383 Other fatigue: Secondary | ICD-10-CM | POA: Insufficient documentation

## 2023-11-24 DIAGNOSIS — Z853 Personal history of malignant neoplasm of breast: Secondary | ICD-10-CM | POA: Diagnosis not present

## 2023-11-24 DIAGNOSIS — I255 Ischemic cardiomyopathy: Secondary | ICD-10-CM | POA: Diagnosis not present

## 2023-11-24 DIAGNOSIS — E7800 Pure hypercholesterolemia, unspecified: Secondary | ICD-10-CM | POA: Insufficient documentation

## 2023-11-24 NOTE — Assessment & Plan Note (Signed)
 Labs in July 2025 demonstrated LDL 61.  LDL optimal.  -Continue rosuvastatin  20 mg daily.

## 2023-11-24 NOTE — Patient Instructions (Addendum)
 Medication Instructions:  Your physician recommends that you continue on your current medications as directed. Please refer to the Current Medication list given to you today.  *If you need a refill on your cardiac medications before your next appointment, please call your pharmacy*  Lab Work: TODAY: BMET, CBC, & TSH  If you have labs (blood work) drawn today and your tests are completely normal, you will receive your results only by: MyChart Message (if you have MyChart) OR A paper copy in the mail If you have any lab test that is abnormal or we need to change your treatment, we will call you to review the results.  Testing/Procedures: Your physician has requested that you have a lexiscan myoview . For further information please visit [email or web address] Please follow instruction sheet, BELOW:   You are scheduled for a Myocardial Perfusion Imaging Study Please arrive 15 minutes prior to your appointment time for registration and insurance purposes.  The test will take approximately 3 to 4 hours to complete; you may bring reading material.  If someone comes with you to your appointment, they will need to remain in the main lobby due to limited space in the testing area. **If you are pregnant or breastfeeding, please notify the nuclear lab prior to your appointment**  How to prepare for your Myocardial Perfusion Test: Do not eat or drink 3 hours prior to your test, except you may have water. Do not consume products containing caffeine (regular or decaffeinated) 12 hours prior to your test. (ex: coffee, chocolate, sodas, tea). Do bring a list of your current medications with you.  If not listed below, you may take your medications as normal. Do wear comfortable clothes (no dresses or overalls) and walking shoes, tennis shoes preferred (No heels or open toe shoes are allowed). Do NOT wear cologne, perfume, aftershave, or lotions (deodorant is allowed). If these instructions are not followed,  your test will have to be rescheduled.     Your physician has requested that you have an echocardiogram. Echocardiography is a painless test that uses sound waves to create images of your heart. It provides your doctor with information about the size and shape of your heart and how well your heart's chambers and valves are working. This procedure takes approximately one hour. There are no restrictions for this procedure. Please do NOT wear cologne, perfume, aftershave, or lotions (deodorant is allowed). Please arrive 15 minutes prior to your appointment time.  Please note: We ask at that you not bring children with you during ultrasound (echo/ vascular) testing. Due to room size and safety concerns, children are not allowed in the ultrasound rooms during exams. Our front office staff cannot provide observation of children in our lobby area while testing is being conducted. An adult accompanying a patient to their appointment will only be allowed in the ultrasound room at the discretion of the ultrasound technician under special circumstances. We apologize for any inconvenience.   Follow-Up: At [facility], you and your health needs are our priority.  As part of our continuing mission to provide you with exceptional heart care, our providers are all part of one team.  This team includes your primary Cardiologist (physician) and Advanced Practice Providers or APPs (Physician Assistants and Nurse Practitioners) who all work together to provide you with the care you need, when you need it.  Your next appointment:   6 month(s)  Provider:   Ozell Fell, MD or Glendia Ferrier, PA-C  We recommend signing up for the patient portal called MyChart.  Sign up information is provided on this After Visit Summary.  MyChart is used to connect with patients for Virtual Visits (Telemedicine).  Patients are able to view lab/test results, encounter notes, upcoming appointments, etc.  Non-urgent  messages can be sent to your provider as well.   To learn more about what you can do with MyChart, go to [email or web address].   Other Instructions

## 2023-11-25 LAB — CBC
Hematocrit: 39.7 % (ref 34.0–46.6)
Hemoglobin: 13.2 g/dL (ref 11.1–15.9)
MCH: 33.5 pg — ABNORMAL HIGH (ref 26.6–33.0)
MCHC: 33.2 g/dL (ref 31.5–35.7)
MCV: 101 fL — ABNORMAL HIGH (ref 79–97)
Platelets: 227 x10E3/uL (ref 150–450)
RBC: 3.94 x10E6/uL (ref 3.77–5.28)
RDW: 12.6 % (ref 11.7–15.4)
WBC: 7.3 x10E3/uL (ref 3.4–10.8)

## 2023-11-25 LAB — COMPREHENSIVE METABOLIC PANEL WITH GFR
ALT: 14 IU/L (ref 0–32)
AST: 17 IU/L (ref 0–40)
Albumin: 4.3 g/dL (ref 3.7–4.7)
Alkaline Phosphatase: 63 IU/L (ref 48–129)
BUN/Creatinine Ratio: 28 (ref 12–28)
BUN: 16 mg/dL (ref 8–27)
Bilirubin Total: 0.4 mg/dL (ref 0.0–1.2)
CO2: 22 mmol/L (ref 20–29)
Calcium: 9.2 mg/dL (ref 8.7–10.3)
Chloride: 103 mmol/L (ref 96–106)
Creatinine, Ser: 0.57 mg/dL (ref 0.57–1.00)
Globulin, Total: 1.7 g/dL (ref 1.5–4.5)
Glucose: 73 mg/dL (ref 70–99)
Potassium: 4.3 mmol/L (ref 3.5–5.2)
Sodium: 140 mmol/L (ref 134–144)
Total Protein: 6 g/dL (ref 6.0–8.5)
eGFR: 91 mL/min/1.73 (ref >59)

## 2023-11-25 LAB — TSH: TSH: 1.4 u[IU]/mL (ref 0.450–4.500)

## 2023-11-26 ENCOUNTER — Ambulatory Visit: Payer: Self-pay | Admitting: Physician Assistant

## 2023-11-27 ENCOUNTER — Ambulatory Visit

## 2023-11-27 VITALS — Wt 156.0 lb

## 2023-11-27 DIAGNOSIS — Z483 Aftercare following surgery for neoplasm: Secondary | ICD-10-CM

## 2023-11-27 NOTE — Therapy (Signed)
 OUTPATIENT PHYSICAL THERAPY SOZO SCREENING NOTE   Patient Name: Jessica [name] MRN: [id number] DOB:April 19, 1940, 83 y.o., female Today's Date: 11/27/2023  PCP: Charlanne Fredia CROME, MD REFERRING PROVIDER: Odean Potts, MD   PT End of Session - 11/27/23 1025     Visit Number 11   # unchnaged due to screen only   PT Start Time 1022    PT Stop Time 1026    PT Time Calculation (min) 4 min    Activity Tolerance Patient tolerated treatment well    Behavior During Therapy [facility] for tasks assessed/performed          Past Medical History:  Diagnosis Date   Anemia 12/2015   Bronchitis, chronic (HCC)    in her 64s   Cancer (HCC)    breast   Cataract    Chronic kidney disease    in 20s had nephritis   Closed fracture of right distal humerus    right   Coronary artery disease    Elevated Lp(a)    Hand, foot and mouth disease (HFMD)    Hearing loss 05/03/2016   High cholesterol    Hypothyroidism    Myocardial infarction (HCC)    Neuropathy    toes   Osteopenia    Pericarditis 01/1999   Pneumothorax 01/1999   Thyroid  disorder    Vitamin B 12 deficiency    Past Surgical History:  Procedure Laterality Date   birthmark      removed,left leg   BREAST BIOPSY Left 09/13/2022   US  LT BREAST BX W LOC DEV 1ST LESION IMG BX SPEC US  GUIDE 09/13/2022 [facility] MAMMOGRAPHY   CARDIAC CATHETERIZATION     CATARACT EXTRACTION  2017   CORONARY STENT INTERVENTION N/A 09/01/2020   Procedure: CORONARY STENT INTERVENTION;  Surgeon: Claudene Victory ORN, MD;  Location: [facility];  Service: Cardiovascular;  Laterality: N/A;   LEFT HEART CATH AND CORONARY ANGIOGRAPHY N/A 09/01/2020   Procedure: LEFT HEART CATH AND CORONARY ANGIOGRAPHY;  Surgeon: Claudene Victory ORN, MD;  Location: [facility];  Service: Cardiovascular;  Laterality: N/A;   ORIF HUMERUS FRACTURE Right 04/10/2019   Procedure: RIGHT OPEN REDUCTION INTERNAL FIXATION (ORIF) DISTAL HUMERUS FRACTURE WITH EXTENSION;  Surgeon: Cristy Bonner DASEN,  MD;  Location: [facility];  Service: Orthopedics;  Laterality: Right;   PORTACATH PLACEMENT N/A 11/15/2022   Procedure: PORT PLACEMENT WITH ULTRASOUND GUIDANCE;  Surgeon: Vanderbilt Ned, MD;  Location: [facility] OR;  Service: General;  Laterality: N/A;   SIMPLE MASTECTOMY WITH AXILLARY SENTINEL NODE BIOPSY Bilateral 10/18/2022   Procedure: BILATERAL SIMPLE MASTECTOMY;  Surgeon: Vanderbilt Ned, MD;  Location: [facility];  Service: General;  Laterality: Bilateral;  PEC BLOCK   TONSILLECTOMY AND ADENOIDECTOMY  1952?   UMBILICAL HERNIA REPAIR N/A 09/20/2023   Procedure: REPAIR, HERNIA, UMBILICAL, ADULT;  Surgeon: Lyndel Deward PARAS, MD;  Location: [facility];  Service: General;  Laterality: N/A;   Patient Active Problem List   Diagnosis Date Noted   Blurry vision, bilateral 08/30/2023   Paresthesia 08/30/2023   Malignant neoplasm of upper-outer quadrant of left breast in female, estrogen receptor negative (HCC) 09/21/2022   CAD (coronary artery disease) 04/08/2022   Swelling of left foot 04/07/2022   Left hand fracture, sequela 04/07/2022   Depression with anxiety 04/07/2022   NSTEMI (non-ST elevated myocardial infarction) (HCC) 09/01/2020   Closed fracture of right distal humerus 04/10/2019   Dyspnea 05/09/2018   Vitamin B 12 deficiency 05/09/2018   Cough 03/06/2018  Senile osteoporosis 11/20/2017   Iron deficiency 04/26/2017   Lichen sclerosus 04/26/2017   External hemorrhoid 04/26/2017   Weakness of left leg 06/07/2016   Balance disorder 05/24/2016   Memory deficit 05/24/2016   Anemia 05/19/2016   Hearing loss 05/03/2016   Hypothyroidism 07/24/2013   Bradycardia on ECG 07/24/2013   Numbness 08/07/2012   Pure hypercholesterolemia 12/29/2006    REFERRING DIAG: left breast cancer at risk for lymphedema  THERAPY DIAG: Aftercare following surgery for neoplasm  PERTINENT HISTORY: Patient was diagnosed on 09/13/2022 with 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 She underwent a bilateral mastectomy and left sentinel node biopsy (7 negative nodes) on 10/18/2022. It is triple negative with a Ki67 of 5%. She has some cardiac history and had an elbow fracture in 2022 resulting in surgery where pins were placed.   PRECAUTIONS: left UE Lymphedema risk, None  SUBJECTIVE: Pt returns for her 3 month L-Dex screen.   PAIN:  Are you having pain? No  SOZO SCREENING: Patient was assessed today using the SOZO machine to determine the lymphedema index score. This was compared to her baseline score. It was determined that she is within the recommended range when compared to her baseline and no further action is needed at this time. She will continue SOZO screenings. These are done every 3 months for 2 years post operatively followed by every 6 months for 2 years, and then annually.   L-DEX FLOWSHEETS - 11/27/23 1000       L-DEX LYMPHEDEMA SCREENING   Measurement Type Unilateral    L-DEX MEASUREMENT EXTREMITY Upper Extremity    POSITION  Standing    DOMINANT SIDE Right    At Risk Side Left    BASELINE SCORE (UNILATERAL) -2.2    L-DEX SCORE (UNILATERAL) 0.5    VALUE CHANGE (UNILAT) 2.7           Aden Berwyn Caldron, PTA 11/27/2023, 10:27 AM

## 2023-11-28 DIAGNOSIS — Z23 Encounter for immunization: Secondary | ICD-10-CM | POA: Diagnosis not present

## 2023-11-28 NOTE — Patient Instructions (Incomplete)
 1) Our records indicate you are due for an annual wellness visit, stop at check out to schedule (video or in-person).    2) Please visit your local pharmacy to receive your  flu and Covid vaccine or to sign up at [facility], if you have not already received.    3) Labs will be done on October 6th at [facility] at 7:45 am

## 2023-11-29 ENCOUNTER — Encounter: Payer: Self-pay | Admitting: Internal Medicine

## 2023-11-29 ENCOUNTER — Non-Acute Institutional Stay: Admitting: Internal Medicine

## 2023-11-29 VITALS — BP 118/76 | HR 60 | Temp 97.0°F | Resp 18 | Wt 154.8 lb

## 2023-11-29 DIAGNOSIS — R5383 Other fatigue: Secondary | ICD-10-CM | POA: Diagnosis not present

## 2023-11-29 DIAGNOSIS — R739 Hyperglycemia, unspecified: Secondary | ICD-10-CM | POA: Diagnosis not present

## 2023-11-29 DIAGNOSIS — E538 Deficiency of other specified B group vitamins: Secondary | ICD-10-CM

## 2023-11-29 DIAGNOSIS — M810 Age-related osteoporosis without current pathological fracture: Secondary | ICD-10-CM | POA: Diagnosis not present

## 2023-11-29 DIAGNOSIS — F5101 Primary insomnia: Secondary | ICD-10-CM | POA: Diagnosis not present

## 2023-11-29 MED ORDER — TRAZODONE HCL 50 MG PO TABS
25.0000 mg | ORAL_TABLET | Freq: Every evening | ORAL | 3 refills | Status: DC | PRN
Start: 2023-11-29 — End: 2023-12-13

## 2023-11-29 NOTE — Progress Notes (Signed)
 [facility]: [facility] Service:  Clinic (12)  Provider:   Code Status:  Goals of Care:     09/20/2023    6:57 AM  Advanced Directives  Does Patient Have a Medical Advance Directive? Yes  Type of Advance Directive Living will;Healthcare Power of Attorney     Chief Complaint  Patient presents with   Fatigue    Patient stated that she has being having weakness for a month, Patient denies chill or diaherra     HPI: Patient is a 83 y.o. female seen today for an acute visit for c/o Fatigue   Discussed the use of AI scribe software for clinical note transcription with the patient, who gave verbal consent to proceed.  History of Present Illness   Jessica [name] is an 83 year old female who presents with fatigue and weakness.  She experiences significant fatigue and weakness, especially after physical exertion, affecting her ability to perform daily activities such as standing, sitting, and walking. Her stamina has decreased, leading to canceled therapy sessions. Hip arthritis causes pain during ambulation, previously manageable by walking. Neuropathy is managed with gabapentin  100 mg at night.  Had Detail Work up by Cardiology and was told every thing is good Plan For Stress test soon   Sleep disturbances include a pattern of insufficient sleep followed by exhaustion and a night of good sleep. Melatonin has not improved her sleep quality. Denies Depression  Current medications include calcium , vitamin D , Plavix , Prolia , gabapentin , Synthroid , and rosuvastatin . She participates in a clinical trial with injections for her Cholesterol  She underwent surgery for her hernia in July, initially causing fatigue, but current symptoms are more recent. She also has a cataract issue requiring a laser procedure.      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 She decided to undergo Bilateral Mastectomy Followed by Chemo and  Radiation therapy Finished in 3/25    Neuropathy Has seen Dr Onita Gartner Worsened since Chemo    Patient h/o  NSTEMI 06/22   Was seen by Dr onita recently for Double vision/Blurry vision MRI negative   Umbilical Hernia Repair on 09/20/2023 Recovered Past Medical History:  Diagnosis Date   Anemia 12/2015   Bronchitis, chronic (HCC)    in her 53s   Cancer (HCC)    breast   Cataract    Chronic kidney disease    in 20s had nephritis   Closed fracture of right distal humerus    right   Coronary artery disease    Elevated Lp(a)    Hand, foot and mouth disease (HFMD)    Hearing loss 05/03/2016   High cholesterol    Hypothyroidism    Myocardial infarction (HCC)    Neuropathy    toes   Osteopenia    Pericarditis 01/1999   Pneumothorax 01/1999   Thyroid  disorder    Vitamin B 12 deficiency     Past Surgical History:  Procedure Laterality Date   birthmark      removed,left leg   BREAST BIOPSY Left 09/13/2022   US  LT BREAST BX W LOC DEV 1ST LESION IMG BX SPEC US  GUIDE 09/13/2022 [facility]   CARDIAC CATHETERIZATION     CATARACT EXTRACTION  2017   CORONARY STENT INTERVENTION N/A 09/01/2020   Procedure: CORONARY STENT INTERVENTION;  Surgeon: Claudene Victory ORN, MD;  Location: [facility] INVASIVE CV LAB;  Service: Cardiovascular;  Laterality: N/A;   LEFT HEART CATH AND  CORONARY ANGIOGRAPHY N/A 09/01/2020   Procedure: LEFT HEART CATH AND CORONARY ANGIOGRAPHY;  Surgeon: Claudene Victory ORN, MD;  Location: [facility] INVASIVE CV LAB;  Service: Cardiovascular;  Laterality: N/A;   ORIF HUMERUS FRACTURE Right 04/10/2019   Procedure: RIGHT OPEN REDUCTION INTERNAL FIXATION (ORIF) DISTAL HUMERUS FRACTURE WITH EXTENSION;  Surgeon: Cristy Bonner DASEN, MD;  Location: [facility];  Service: Orthopedics;  Laterality: Right;   PORTACATH PLACEMENT N/A 11/15/2022   Procedure: PORT PLACEMENT WITH ULTRASOUND GUIDANCE;  Surgeon: Vanderbilt Ned, MD;  Location: [facility] OR;  Service: General;  Laterality: N/A;   SIMPLE  MASTECTOMY WITH AXILLARY SENTINEL NODE BIOPSY Bilateral 10/18/2022   Procedure: BILATERAL SIMPLE MASTECTOMY;  Surgeon: Vanderbilt Ned, MD;  Location: [facility];  Service: General;  Laterality: Bilateral;  PEC BLOCK   TONSILLECTOMY AND ADENOIDECTOMY  1951?   UMBILICAL HERNIA REPAIR N/A 09/20/2023   Procedure: REPAIR, HERNIA, UMBILICAL, ADULT;  Surgeon: Lyndel Deward PARAS, MD;  Location: [facility];  Service: General;  Laterality: N/A;    No Known Allergies  Outpatient Encounter Medications as of 11/29/2023  Medication Sig   acetaminophen  (TYLENOL ) 500 MG tablet Take 500-1,000 mg by mouth every 6 (six) hours as needed (pain.).   ascorbic acid  (VITAMIN C) 500 MG tablet Take 500 mg by mouth daily as needed (immune support (sore throat)).   Calcium  Carbonate-Vitamin D  (CALCIUM  600+D PO) Take 1 tablet by mouth in the morning.   carboxymethylcellulose (REFRESH PLUS) 0.5 % SOLN Place 1 drop into both eyes 3 (three) times daily as needed (dry/irritated eyes.).   cholecalciferol  (VITAMIN D3) 25 MCG (1000 UNIT) tablet Take 1,000 Units by mouth in the morning.   clobetasol ointment (TEMOVATE) 0.05 % Apply 1 Application topically 4 days.   clopidogrel  (PLAVIX ) 75 MG tablet TAKE 1 TABLET BY MOUTH DAILY   denosumab  (PROLIA ) 60 MG/ML SOSY injection Inject 60 mg into the skin every 6 (six) months. Administered at [facility] q2m   ferrous sulfate 325 (65 FE) MG tablet Take 325 mg by mouth every evening.   gabapentin  (NEURONTIN ) 100 MG capsule TAKE 1 CAPSULE BY MOUTH DAILY   levothyroxine  (SYNTHROID ) 112 MCG tablet TAKE 1 TABLET BY MOUTH DAILY   melatonin 5 MG TABS Take 5 mg by mouth at bedtime.   Multiple Vitamin (MULTIVITAMIN WITH MINERALS) TABS tablet Take 1 tablet by mouth every evening.   nitroGLYCERIN  (NITROSTAT ) 0.4 MG SL tablet DISSOLVE 1 TAB UNDER TONGUE FOR CHEST PAIN - IF PAIN REMAINS AFTER 5 MIN, CALL 911 AND REPEAT DOSE. MAX 3 TABS IN 15 MINUTES   rosuvastatin  (CRESTOR ) 20 MG  tablet TAKE 1 TABLET BY MOUTH DAILY   Study - [name] - olpasiran (AMG 890) 142 mg/mL or placebo SQ injection (PI-Hilty) Inject 142 mg into the skin once. For investigational use only - Inject 1 mL (1 prefilled syringe) subcutaneously into appropriate injection site per protocol. (Approved injection site(s): upper arm, upper thigh, abdomen). Please contact [facility] if any questions.   traZODone  (DESYREL ) 50 MG tablet Take 0.5 tablets (25 mg total) by mouth at bedtime as needed for sleep.   loratadine  (CLARITIN ) 10 MG tablet Take 10 mg by mouth as needed for allergies.   [DISCONTINUED] oxyCODONE -acetaminophen  (PERCOCET) 5-325 MG tablet Take 1 tablet by mouth every 4 (four) hours as needed for severe pain (pain score 7-10). (Patient not taking: Reported on 09/25/2023)   Facility-Administered Encounter Medications as of 11/29/2023  Medication   denosumab  (PROLIA ) injection 60 mg  Review of Systems:  Review of Systems  Constitutional:  Positive for activity change and fatigue. Negative for appetite change.  HENT: Negative.    Respiratory:  Negative for cough and shortness of breath.   Cardiovascular:  Negative for leg swelling.  Gastrointestinal:  Negative for constipation.  Genitourinary: Negative.   Musculoskeletal:  Negative for arthralgias, gait problem and myalgias.  Skin: Negative.   Neurological:  Positive for weakness. Negative for dizziness.  Psychiatric/Behavioral:  Positive for sleep disturbance. Negative for confusion and dysphoric mood.     Health Maintenance  Topic Date Due   Medicare Annual Wellness (AWV)  07/11/2023   Influenza Vaccine  10/06/2023   COVID-19 Vaccine (6 - 2025-26 season) 11/06/2023   Colonoscopy  02/03/2026 (Originally 07/11/2020)   DTaP/Tdap/Td (4 - Td or Tdap) 03/24/2032   Pneumococcal Vaccine: 50+ Years  Completed   DEXA SCAN  Completed   Zoster Vaccines- Shingrix  Completed   HPV VACCINES  Aged Out   Meningococcal B Vaccine  Aged  Out   Hepatitis B Vaccines 19-59 Average Risk  Discontinued    Physical Exam: Vitals:   11/29/23 0911  BP: 118/76  Pulse: 60  Resp: 18  Temp: (!) 97 F (36.1 C)  SpO2: 97%  Weight: 154 lb 12.8 oz (70.2 kg)   Body mass index is 27.42 kg/m. Physical Exam Vitals reviewed.  Constitutional:      Appearance: Normal appearance.  HENT:     Head: Normocephalic.     Nose: Nose normal.     Mouth/Throat:     Mouth: Mucous membranes are moist.     Pharynx: Oropharynx is clear.  Eyes:     Pupils: Pupils are equal, round, and reactive to light.  Cardiovascular:     Rate and Rhythm: Normal rate and regular rhythm.     Pulses: Normal pulses.     Heart sounds: Normal heart sounds. No murmur heard. Pulmonary:     Effort: Pulmonary effort is normal.     Breath sounds: Normal breath sounds.  Abdominal:     General: Abdomen is flat. Bowel sounds are normal.     Palpations: Abdomen is soft.  Musculoskeletal:        General: No swelling.     Cervical back: Neck supple.     Comments: Hip Exam with no pain  Skin:    General: Skin is warm.  Neurological:     General: No focal deficit present.     Mental Status: She is alert and oriented to person, place, and time.  Psychiatric:        Mood and Affect: Mood normal.        Thought Content: Thought content normal.     Labs reviewed: Basic Metabolic Panel: Recent Labs    03/15/23 1025 09/07/23 0807 11/24/23 1317  NA 139 139 140  K 4.3 4.1 4.3  CL 108 107 103  CO2 26 28 22   GLUCOSE 93 94 73  BUN 12 15 16   CREATININE 0.56 0.59* 0.57  CALCIUM  8.8* 8.7 9.2  TSH  --  1.39 1.400   Liver Function Tests: Recent Labs    02/15/23 1057 03/15/23 1025 09/07/23 0807 11/24/23 1317  AST 16 17 15 17   ALT 15 16 14 14   ALKPHOS 58 59  --  63  BILITOT 0.5 0.6 0.5 0.4  PROT 6.4* 6.3* 6.0* 6.0  ALBUMIN 4.1 4.0  --  4.3   No results for input(s): LIPASE, AMYLASE in the last 8760 hours.  No results for input(s): AMMONIA in the last  8760 hours. CBC: Recent Labs    02/15/23 1057 03/15/23 1025 09/07/23 0807 11/24/23 1317  WBC 4.3 7.7 6.2 7.3  NEUTROABS 1.7 5.5 4,179  --   HGB 12.6 12.5 13.3 13.2  HCT 36.7 36.5 39.4 39.7  MCV 98.1 97.6 98.5 101*  PLT 242 213 194 227   Lipid Panel: Recent Labs    09/07/23 0807  CHOL 131  HDL 52  LDLCALC 61  TRIG 96  CHOLHDL 2.5   Lab Results  Component Value Date   HGBA1C 5.9 (H) 04/11/2022    Procedures since last visit: No results found.  Assessment/Plan   Assessment and Plan    Insomnia Chronic insomnia persists despite melatonin. Trazodone  considered for sleep regulation. - Prescribe trazodone  25 mg, start with 25 mg and adjust as needed. - Discussed potential side effects of trazodone , including dry mouth and lingering drowsiness.  Peripheral neuropathy Chronic neuropathy managed with gabapentin , possibly contributing to weakness and fatigue. - Continue gabapentin  100 mg at night.  Generalized weakness and fatigue Persistent weakness and fatigue post-surgery, with no cardiology concerns. Possible neuropathy progression. Slightly elevated MCV noted. - Order vitamin B12 and magnesium levels. - Order hemoglobin A1c to rule out diabetes. - Review results of upcoming medical stress test and echocardiogram.  Hip pain likely due to osteoarthritis Intermittent hip pain likely from osteoarthritis, manageable without intervention. - Monitor hip pain, no immediate intervention required.       Labs/tests ordered:  * No order type specified * Next appt:  12/13/2023

## 2023-11-30 ENCOUNTER — Telehealth: Payer: Self-pay

## 2023-11-30 NOTE — Telephone Encounter (Signed)
 Copied from CRM [id number]. Topic: Clinical - Request for Lab/Test Order >> Nov 30, 2023 11:37 AM Zane F wrote: Reason for CRM:   Patient is calling in to figure out when she is suppose to have her labs done that were recently requested by Dr. Charlanne. The patient was under the impression the labs would be completed on October 6th. Patient is also unclear of the specific labs Dr. Charlanne would like her to have done prior to her visit on Oct 8th at 8:20 am.   Please confirm with Dr. Charlanne the labs she is requesting, place those labs and contact the patient to provide her with an update.  Callback Number: [phone]

## 2023-11-30 NOTE — Telephone Encounter (Signed)
 Spoke with patient and informed her that we do not schedule lab appointments for [facility] or [place], however we note the appointment date and time on the after visit summary.   Patient is now aware of process and date/time of her labs. Information was also sent via mychart

## 2023-12-05 ENCOUNTER — Encounter (HOSPITAL_COMMUNITY): Payer: Self-pay

## 2023-12-08 ENCOUNTER — Other Ambulatory Visit: Payer: Self-pay | Admitting: Physician Assistant

## 2023-12-08 DIAGNOSIS — I2510 Atherosclerotic heart disease of native coronary artery without angina pectoris: Secondary | ICD-10-CM

## 2023-12-08 DIAGNOSIS — Z23 Encounter for immunization: Secondary | ICD-10-CM | POA: Diagnosis not present

## 2023-12-08 DIAGNOSIS — R5383 Other fatigue: Secondary | ICD-10-CM

## 2023-12-08 DIAGNOSIS — E7800 Pure hypercholesterolemia, unspecified: Secondary | ICD-10-CM

## 2023-12-11 ENCOUNTER — Inpatient Hospital Stay: Attending: Hematology and Oncology | Admitting: Hematology and Oncology

## 2023-12-11 VITALS — BP 124/61 | HR 62 | Temp 97.3°F | Resp 16 | Wt 156.0 lb

## 2023-12-11 DIAGNOSIS — R739 Hyperglycemia, unspecified: Secondary | ICD-10-CM | POA: Diagnosis not present

## 2023-12-11 DIAGNOSIS — M810 Age-related osteoporosis without current pathological fracture: Secondary | ICD-10-CM | POA: Diagnosis not present

## 2023-12-11 DIAGNOSIS — Z9013 Acquired absence of bilateral breasts and nipples: Secondary | ICD-10-CM | POA: Diagnosis not present

## 2023-12-11 DIAGNOSIS — Z171 Estrogen receptor negative status [ER-]: Secondary | ICD-10-CM | POA: Diagnosis not present

## 2023-12-11 DIAGNOSIS — E538 Deficiency of other specified B group vitamins: Secondary | ICD-10-CM | POA: Diagnosis not present

## 2023-12-11 DIAGNOSIS — Z853 Personal history of malignant neoplasm of breast: Secondary | ICD-10-CM | POA: Insufficient documentation

## 2023-12-11 DIAGNOSIS — Z9221 Personal history of antineoplastic chemotherapy: Secondary | ICD-10-CM | POA: Insufficient documentation

## 2023-12-11 DIAGNOSIS — Z08 Encounter for follow-up examination after completed treatment for malignant neoplasm: Secondary | ICD-10-CM | POA: Insufficient documentation

## 2023-12-11 DIAGNOSIS — C50412 Malignant neoplasm of upper-outer quadrant of left female breast: Secondary | ICD-10-CM | POA: Diagnosis not present

## 2023-12-11 DIAGNOSIS — Z923 Personal history of irradiation: Secondary | ICD-10-CM | POA: Diagnosis not present

## 2023-12-11 NOTE — Progress Notes (Signed)
 Patient Care Team: Charlanne Fredia CROME, MD as PCP - General (Internal Medicine) Wonda Sharper, MD as PCP - Cardiology (Cardiology) Donnald Charleston, MD as Consulting Physician (Gastroenterology) Charmayne Molly, MD as Consulting Physician (Ophthalmology) [place], Amy, MD as Consulting Physician (Dermatology) Mast, Man X, NP as Nurse Practitioner (Internal Medicine) Odean Potts, MD as Consulting Physician (Hematology and Oncology) Vanderbilt Ned, MD as Consulting Physician (General Surgery)  DIAGNOSIS:  Encounter Diagnosis  Name Primary?   Malignant neoplasm of upper-outer quadrant of left breast in female, estrogen receptor negative (HCC) Yes    SUMMARY OF ONCOLOGIC HISTORY: Oncology History  Malignant neoplasm of upper-outer quadrant of left breast in female, estrogen receptor negative (HCC)  09/13/2022 Initial Diagnosis   Screening mammogram detected spiculated left breast mass upper outer quadrant 5 o'clock position measuring 2.1 cm, axilla negative, biopsy revealed grade 2 invasive lobular cancer with LCIS ER 0%, PR 0%, Ki67 5%, HER2 0, negative, T2 N0 M0 stage IIb    09/21/2022 Cancer Staging   Staging form: Breast, AJCC 8th Edition - Clinical: Stage IIB (cT2, cN0, cM0, G2, ER-, PR-, HER2-) - Signed by Odean Potts, MD on 09/21/2022 Stage prefix: Initial diagnosis Histologic grading system: 3 grade system   09/27/2022 Breast MRI   Breast MRI: 2.5 cm left breast mass, ill-defined non-mass enhancement 8 cm. Indeterminate 1.2 cm right breast mass    09/30/2022 Initial Biopsy   Right breast biopsy demonstrates intraductal papilloma and no evidence of atypia or malignancy.   10/18/2022 Surgery   Left mastectomy: Grade 2 invasive lobular cancer 2.7 cm with DCIS, margins negative, suspicious for LVI, ER 0%, PR 0%, HER2 negative, Ki-67 5%, 0/7 lymph nodes Right mastectomy: Benign   11/23/2022 - 03/15/2023 Chemotherapy   Patient is on Treatment Plan : BREAST Adjuvant CMF IV q21d      04/19/2023 - 05/11/2023 Radiation Therapy   Plan Name: CW_L_BH_BO Site: Chest Wall, Left Technique: 3D Mode: Photon Dose Per Fraction: 2.67 Gy Prescribed Dose (Delivered / Prescribed): 21.36 Gy / 21.36 Gy Prescribed Fxs (Delivered / Prescribed): 8 / 8   Plan Name: CW_L_BH Site: Chest Wall, Left Technique: 3D Mode: Photon Dose Per Fraction: 2.67 Gy Prescribed Dose (Delivered / Prescribed): 18.69 Gy / 18.69 Gy Prescribed Fxs (Delivered / Prescribed): 7 / 7     CHIEF COMPLIANT: Surveillance of breast cancer  HISTORY OF PRESENT ILLNESS:   History of Present Illness Jessica [name] is an 83 year old female who presents for a follow-up visit post-treatment for breast cancer.  She completed her last chemotherapy treatment on January 8th of this year, followed by radiation therapy. She is actively participating in a study involving LP(a) cholesterol, receiving injections every three to four months.     ALLERGIES:  has no known allergies.  MEDICATIONS:  Current Outpatient Medications  Medication Sig Dispense Refill   acetaminophen  (TYLENOL ) 500 MG tablet Take 500-1,000 mg by mouth every 6 (six) hours as needed (pain.).     ascorbic acid  (VITAMIN C) 500 MG tablet Take 500 mg by mouth daily as needed (immune support (sore throat)).     Calcium  Carbonate-Vitamin D  (CALCIUM  600+D PO) Take 1 tablet by mouth in the morning.     carboxymethylcellulose (REFRESH PLUS) 0.5 % SOLN Place 1 drop into both eyes 3 (three) times daily as needed (dry/irritated eyes.).     cholecalciferol  (VITAMIN D3) 25 MCG (1000 UNIT) tablet Take 1,000 Units by mouth in the morning.     clopidogrel  (PLAVIX ) 75 MG tablet TAKE  1 TABLET BY MOUTH DAILY 90 tablet 3   denosumab  (PROLIA ) 60 MG/ML SOSY injection Inject 60 mg into the skin every 6 (six) months. Administered at [facility] q27m     ferrous sulfate 325 (65 FE) MG tablet Take 325 mg by mouth every evening.     gabapentin  (NEURONTIN ) 100 MG capsule  TAKE 1 CAPSULE BY MOUTH DAILY 90 capsule 1   levothyroxine  (SYNTHROID ) 112 MCG tablet TAKE 1 TABLET BY MOUTH DAILY 90 tablet 3   loratadine  (CLARITIN ) 10 MG tablet Take 10 mg by mouth as needed for allergies.     melatonin 5 MG TABS Take 5 mg by mouth at bedtime.     Multiple Vitamin (MULTIVITAMIN WITH MINERALS) TABS tablet Take 1 tablet by mouth every evening.     rosuvastatin  (CRESTOR ) 20 MG tablet TAKE 1 TABLET BY MOUTH DAILY 90 tablet 3   Study - [name] - olpasiran (AMG 890) 142 mg/mL or placebo SQ injection (PI-Hilty) Inject 142 mg into the skin once. For investigational use only - Inject 1 mL (1 prefilled syringe) subcutaneously into appropriate injection site per protocol. (Approved injection site(s): upper arm, upper thigh, abdomen). Please contact [facility] if any questions.     traZODone  (DESYREL ) 50 MG tablet Take 0.5 tablets (25 mg total) by mouth at bedtime as needed for sleep. 30 tablet 3   clobetasol ointment (TEMOVATE) 0.05 % Apply 1 Application topically 4 days. (Patient not taking: Reported on 12/11/2023)     nitroGLYCERIN  (NITROSTAT ) 0.4 MG SL tablet DISSOLVE 1 TAB UNDER TONGUE FOR CHEST PAIN - IF PAIN REMAINS AFTER 5 MIN, CALL 911 AND REPEAT DOSE. MAX 3 TABS IN 15 MINUTES (Patient not taking: Reported on 12/11/2023) 25 tablet 6   Current Facility-Administered Medications  Medication Dose Route Frequency Provider Last Rate Last Admin   denosumab  (PROLIA ) injection 60 mg  60 mg Subcutaneous Q6 months Charlanne Fredia CROME, MD        PHYSICAL EXAMINATION: ECOG PERFORMANCE STATUS: 1 - Symptomatic but completely ambulatory  Vitals:   12/11/23 0959  BP: 124/61  Pulse: 62  Resp: 16  Temp: (!) 97.3 F (36.3 C)  SpO2: 95%   Filed Weights   12/11/23 0959  Weight: 156 lb (70.8 kg)   Breast exam: No palpable lumps or nodules of bilateral chest wall or axilla  LABORATORY DATA:  I have reviewed the data as listed    Latest Ref Rng & Units 11/24/2023    1:17 PM  09/07/2023    8:07 AM 03/15/2023   10:25 AM  CMP  Glucose 70 - 99 mg/dL 73  94  93   BUN 8 - 27 mg/dL 16  15  12    Creatinine 0.57 - 1.00 mg/dL 9.42  9.40  9.43   Sodium 134 - 144 mmol/L 140  139  139   Potassium 3.5 - 5.2 mmol/L 4.3  4.1  4.3   Chloride 96 - 106 mmol/L 103  107  108   CO2 20 - 29 mmol/L 22  28  26    Calcium  8.7 - 10.3 mg/dL 9.2  8.7  8.8   Total Protein 6.0 - 8.5 g/dL 6.0  6.0  6.3   Total Bilirubin 0.0 - 1.2 mg/dL 0.4  0.5  0.6   Alkaline Phos 48 - 129 IU/L 63   59   AST 0 - 40 IU/L 17  15  17    ALT 0 - 32 IU/L 14  14  16  Lab Results  Component Value Date   WBC 7.3 11/24/2023   HGB 13.2 11/24/2023   HCT 39.7 11/24/2023   MCV 101 (H) 11/24/2023   PLT 227 11/24/2023   NEUTROABS 4,179 09/07/2023    ASSESSMENT & PLAN:  Malignant neoplasm of upper-outer quadrant of left breast in female, estrogen receptor negative (HCC) 09/13/2022: Screening mammogram detected spiculated left breast mass upper outer quadrant 5 o'clock position measuring 2.1 cm, axilla negative, 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  Indeterminate 1.2 cm right breast mass   Recommendation: 10/18/2022:Left mastectomy: Grade 2 invasive lobular cancer 2.7 cm with DCIS, margins negative, suspicious for LVI, ER 0%, PR 0%, HER2 negative, Ki-67 5%, 0/7 lymph nodes Right mastectomy: Benign adjuvant CMF x 6 cycles completed on 10/25/2023 Adjuvant radiation therapy _______________________________________________________________________________________________________________________________________________________________   Current treatment: Surveillance Breast cancer surveillance: No role of imaging since she had bilateral mastectomies We discussed role of guardant reveal for MRD detection.  Return to clinic in 1 year for  follow-up ------------------------------------- Assessment and Plan Assessment & Plan Malignant neoplasm of upper-outer quadrant of left breast Post-treatment surveillance after chemotherapy and radiation therapy completed. No routine scans or imaging required. - Continue post-treatment surveillance without routine scans or imaging. - Use Guardant blood test for monitoring recurrence. - Consider multi-cancer panels as more data becomes available.      No orders of the defined types were placed in this encounter.  The patient has a good understanding of the overall plan. she agrees with it. she will call with any problems that may develop before the next visit here. Total time spent: 30 mins including face to face time and time spent for planning, charting and co-ordination of care   Indy MARLA Chad, MD 12/11/23

## 2023-12-11 NOTE — Assessment & Plan Note (Signed)
 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  Indeterminate 1.2 cm right breast mass   Recommendation: 10/18/2022:Left mastectomy: Grade 2 invasive lobular cancer 2.7 cm with DCIS, margins negative, suspicious for LVI, ER 0%, PR 0%, HER2 negative, Ki-67 5%, 0/7 lymph nodes Right mastectomy: Benign adjuvant CMF x 6 cycles completed on 10/25/2023 Adjuvant radiation therapy _______________________________________________________________________________________________________________________________________________________________   Current treatment: Surveillance Breast cancer surveillance: No role of imaging since she had bilateral mastectomies We discussed role of guardant reveal for MRD detection.  Return to clinic in 1 year for follow-up

## 2023-12-12 LAB — HEMOGLOBIN A1C
Hgb A1c MFr Bld: 5.8 % — ABNORMAL HIGH (ref <5.7)
Mean Plasma Glucose: 120 mg/dL
eAG (mmol/L): 6.6 mmol/L

## 2023-12-12 LAB — VITAMIN D 25 HYDROXY (VIT D DEFICIENCY, FRACTURES): Vit D, 25-Hydroxy: 58 ng/mL (ref 30–100)

## 2023-12-12 LAB — VITAMIN B12: Vitamin B-12: 163 pg/mL — ABNORMAL LOW (ref 200–1100)

## 2023-12-12 NOTE — Patient Instructions (Signed)
 1) Our records indicate you are due for an annual wellness visit, stop at check out to schedule (video or in-person).     2) Please visit your local pharmacy to receive your flu and covid vaccine, if you have not already received.

## 2023-12-13 ENCOUNTER — Encounter: Payer: Self-pay | Admitting: Internal Medicine

## 2023-12-13 ENCOUNTER — Non-Acute Institutional Stay: Admitting: Internal Medicine

## 2023-12-13 ENCOUNTER — Ambulatory Visit: Admitting: Hematology and Oncology

## 2023-12-13 VITALS — BP 124/68 | HR 68 | Temp 96.6°F | Resp 18 | Ht 63.0 in | Wt 157.3 lb

## 2023-12-13 DIAGNOSIS — E039 Hypothyroidism, unspecified: Secondary | ICD-10-CM

## 2023-12-13 DIAGNOSIS — M810 Age-related osteoporosis without current pathological fracture: Secondary | ICD-10-CM | POA: Diagnosis not present

## 2023-12-13 DIAGNOSIS — G629 Polyneuropathy, unspecified: Secondary | ICD-10-CM | POA: Diagnosis not present

## 2023-12-13 DIAGNOSIS — E538 Deficiency of other specified B group vitamins: Secondary | ICD-10-CM

## 2023-12-13 DIAGNOSIS — E782 Mixed hyperlipidemia: Secondary | ICD-10-CM | POA: Diagnosis not present

## 2023-12-13 DIAGNOSIS — R739 Hyperglycemia, unspecified: Secondary | ICD-10-CM | POA: Diagnosis not present

## 2023-12-13 DIAGNOSIS — F5101 Primary insomnia: Secondary | ICD-10-CM

## 2023-12-13 MED ORDER — TRAZODONE HCL 50 MG PO TABS: 25.0000 mg | ORAL_TABLET | Freq: Every evening | ORAL | 3 refills | Status: DC | PRN

## 2023-12-13 MED ORDER — TRAZODONE HCL 50 MG PO TABS: 25.0000 mg | ORAL_TABLET | Freq: Every evening | ORAL | Status: DC | PRN

## 2023-12-13 MED ORDER — VITAMIN B-12 1000 MCG PO TABS: 1000.0000 ug | ORAL_TABLET | Freq: Every day | ORAL | Status: AC

## 2023-12-13 NOTE — Progress Notes (Signed)
 [facility]: [facility] of Service:  Clinic (12)  Provider:   Code Status:  Goals of Care:     12/11/2023   10:06 AM  Advanced Directives  Does Patient Have a Medical Advance Directive? Yes  Type of Estate agent of [place];Living will  Does patient want to make changes to medical advance directive? No - Patient declined  Copy of Healthcare Power of Attorney in Chart? Yes - validated most recent copy scanned in chart (See row information)     Chief Complaint  Patient presents with   Medical Management of Chronic Issues    Two weeks follow-up with labs, needs to discuss medicare annual wellness visit, flu and covid vaccine.     HPI: Patient is a 83 y.o. female seen today for an acute visit for Follow up   She had started Trazodone  2 weeks ago to help with her insomnia   She has started takes trazodone  25 mg, she feels it is working Did c/o Dry Mouth but feels she can live with it   She  has low vitamin B12 levels, with a recent measurement of 163. Her diet is almost vegetarian, consuming fish only occasionally. He experiences numbness and weakness in his legs.    reports recent weight gain without changes in diet or exercise. No swelling in her legs.  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 She decided to undergo Bilateral Mastectomy Followed by Chemo and Radiation therapy Finished in 3/25 Neuropathy Has seen Dr Onita Gartner Worsened since Chemo    Patient h/o  NSTEMI 06/22   Was seen by Dr onita recently for Double vision/Blurry vision MRI negative Past Medical History:  Diagnosis Date   Anemia 12/2015   Bronchitis, chronic (HCC)    in her 61s   Cancer (HCC)    breast   Cataract    Chronic kidney disease    in 20s had nephritis    Closed fracture of right distal humerus    right   Coronary artery disease    Elevated Lp(a)    Hand, foot and mouth disease (HFMD)    Hearing loss 05/03/2016   High cholesterol    Hypothyroidism    Myocardial infarction (HCC)    Neuropathy    toes   Osteopenia    Pericarditis 01/1999   Pneumothorax 01/1999   Thyroid  disorder    Vitamin B 12 deficiency     Past Surgical History:  Procedure Laterality Date   birthmark      removed,left leg   BREAST BIOPSY Left 09/13/2022   US  LT BREAST BX W LOC DEV 1ST LESION IMG BX SPEC US  GUIDE 09/13/2022 [facility] MAMMOGRAPHY   CARDIAC CATHETERIZATION     CATARACT EXTRACTION  2017   CORONARY STENT INTERVENTION N/A 09/01/2020   Procedure: CORONARY STENT INTERVENTION;  Surgeon: Claudene Victory ORN, MD;  [facility]: [facility];  Service: Cardiovascular;  Laterality: N/A;   LEFT HEART CATH AND CORONARY ANGIOGRAPHY N/A 09/01/2020   Procedure: LEFT HEART CATH AND CORONARY ANGIOGRAPHY;  Surgeon: Claudene Victory ORN, MD;  [facility]: [facility];  Service: Cardiovascular;  Laterality: N/A;   ORIF HUMERUS FRACTURE Right 04/10/2019   Procedure: RIGHT OPEN REDUCTION INTERNAL FIXATION (ORIF) DISTAL HUMERUS FRACTURE WITH EXTENSION;  Surgeon: Cristy Bonner DASEN, MD;  Location: [facility];  Service: Orthopedics;  Laterality: Right;   PORTACATH PLACEMENT N/A 11/15/2022   Procedure: PORT PLACEMENT WITH ULTRASOUND GUIDANCE;  Surgeon: Vanderbilt Ned, MD;  Location: [facility];  Service: General;  Laterality: N/A;   SIMPLE MASTECTOMY WITH AXILLARY SENTINEL NODE BIOPSY Bilateral 10/18/2022   Procedure: BILATERAL SIMPLE MASTECTOMY;  Surgeon: Vanderbilt Ned, MD;  Location: [facility];  Service: General;  Laterality: Bilateral;  PEC BLOCK   TONSILLECTOMY AND ADENOIDECTOMY  1952?   UMBILICAL HERNIA REPAIR N/A 09/20/2023   Procedure: REPAIR, HERNIA, UMBILICAL, ADULT;  Surgeon: Lyndel Deward PARAS, MD;  Location: [facility];  Service: General;  Laterality:  N/A;    No Known Allergies  Outpatient Encounter Medications as of 12/13/2023  Medication Sig   acetaminophen  (TYLENOL ) 500 MG tablet Take 500-1,000 mg by mouth every 6 (six) hours as needed (pain.).   ascorbic acid  (VITAMIN C) 500 MG tablet Take 500 mg by mouth daily as needed (immune support (sore throat)).   Calcium  Carbonate-Vitamin D  (CALCIUM  600+D PO) Take 1 tablet by mouth in the morning.   carboxymethylcellulose (REFRESH PLUS) 0.5 % SOLN Place 1 drop into both eyes 3 (three) times daily as needed (dry/irritated eyes.).   cholecalciferol  (VITAMIN D3) 25 MCG (1000 UNIT) tablet Take 1,000 Units by mouth in the morning.   clobetasol ointment (TEMOVATE) 0.05 % Apply 1 Application topically 4 days.   clopidogrel  (PLAVIX ) 75 MG tablet TAKE 1 TABLET BY MOUTH DAILY   cyanocobalamin (VITAMIN B12) 1000 MCG tablet Take 1 tablet (1,000 mcg total) by mouth daily.   denosumab  (PROLIA ) 60 MG/ML SOSY injection Inject 60 mg into the skin every 6 (six) months. Administered at [facility] q7m   ferrous sulfate 325 (65 FE) MG tablet Take 325 mg by mouth every evening.   gabapentin  (NEURONTIN ) 100 MG capsule TAKE 1 CAPSULE BY MOUTH DAILY   levothyroxine  (SYNTHROID ) 112 MCG tablet TAKE 1 TABLET BY MOUTH DAILY   loratadine  (CLARITIN ) 10 MG tablet Take 10 mg by mouth as needed for allergies.   melatonin 5 MG TABS Take 5 mg by mouth at bedtime.   Multiple Vitamin (MULTIVITAMIN WITH MINERALS) TABS tablet Take 1 tablet by mouth every evening.   nitroGLYCERIN  (NITROSTAT ) 0.4 MG SL tablet DISSOLVE 1 TAB UNDER TONGUE FOR CHEST PAIN - IF PAIN REMAINS AFTER 5 MIN, CALL 911 AND REPEAT DOSE. MAX 3 TABS IN 15 MINUTES   rosuvastatin  (CRESTOR ) 20 MG tablet TAKE 1 TABLET BY MOUTH DAILY   Study - [name] - olpasiran (AMG 890) 142 mg/mL or placebo SQ injection (PI-Hilty) Inject 142 mg into the skin once. For investigational use only - Inject 1 mL (1 prefilled syringe) subcutaneously into appropriate injection site  per protocol. (Approved injection site(s): upper arm, upper thigh, abdomen). Please contact [facility] if any questions.   [DISCONTINUED] traZODone  (DESYREL ) 50 MG tablet Take 0.5 tablets (25 mg total) by mouth at bedtime as needed for sleep.   traZODone  (DESYREL ) 50 MG tablet Take 0.5 tablets (25 mg total) by mouth at bedtime as needed for sleep. Can take 25 -50 Mg t night as needed   [DISCONTINUED] traZODone  (DESYREL ) 50 MG tablet Take 0.5 tablets (25 mg total) by mouth at bedtime as needed for sleep. Can take 25 -50 Mg t night as needed   Facility-Administered Encounter Medications as of 12/13/2023  Medication   denosumab  (PROLIA ) injection 60 mg    Review of Systems:  Review  of Systems  Constitutional:  Positive for fatigue. Negative for activity change and appetite change.  HENT: Negative.    Respiratory:  Negative for cough and shortness of breath.   Cardiovascular:  Negative for leg swelling.  Gastrointestinal:  Negative for constipation.  Genitourinary: Negative.   Musculoskeletal:  Positive for gait problem. Negative for arthralgias and myalgias.  Skin: Negative.   Neurological:  Positive for numbness. Negative for dizziness and weakness.  Psychiatric/Behavioral:  Negative for confusion, dysphoric mood and sleep disturbance.     Health Maintenance  Topic Date Due   Medicare Annual Wellness (AWV)  07/11/2023   Influenza Vaccine  10/06/2023   COVID-19 Vaccine (6 - 2025-26 season) 11/06/2023   Colonoscopy  02/03/2026 (Originally 07/11/2020)   DTaP/Tdap/Td (4 - Td or Tdap) 03/24/2032   Pneumococcal Vaccine: 50+ Years  Completed   DEXA SCAN  Completed   Zoster Vaccines- Shingrix  Completed   Meningococcal B Vaccine  Aged Out   Hepatitis B Vaccines 19-59 Average Risk  Discontinued    Physical Exam: Vitals:   12/13/23 0827  BP: 124/68  Pulse: 68  Resp: 18  Temp: (!) 96.6 F (35.9 C)  SpO2: 98%  Weight: 157 lb 4.8 oz (71.4 kg)  Height: 5' 3 (1.6 m)    Body mass index is 27.86 kg/m. Physical Exam Vitals reviewed.  Constitutional:      Appearance: Normal appearance.  HENT:     Head: Normocephalic.     Nose: Nose normal.     Mouth/Throat:     Mouth: Mucous membranes are moist.     Pharynx: Oropharynx is clear.  Eyes:     Pupils: Pupils are equal, round, and reactive to light.  Cardiovascular:     Heart sounds: No murmur heard. Musculoskeletal:        General: No swelling.     Cervical back: Neck supple.  Skin:    General: Skin is warm.  Neurological:     General: No focal deficit present.     Mental Status: She is alert and oriented to person, place, and time.  Psychiatric:        Mood and Affect: Mood normal.        Thought Content: Thought content normal.     Labs reviewed: Basic Metabolic Panel: Recent Labs    03/15/23 1025 09/07/23 0807 11/24/23 1317  NA 139 139 140  K 4.3 4.1 4.3  CL 108 107 103  CO2 26 28 22   GLUCOSE 93 94 73  BUN 12 15 16   CREATININE 0.56 0.59* 0.57  CALCIUM  8.8* 8.7 9.2  TSH  --  1.39 1.400   Liver Function Tests: Recent Labs    02/15/23 1057 03/15/23 1025 09/07/23 0807 11/24/23 1317  AST 16 17 15 17   ALT 15 16 14 14   ALKPHOS 58 59  --  63  BILITOT 0.5 0.6 0.5 0.4  PROT 6.4* 6.3* 6.0* 6.0  ALBUMIN 4.1 4.0  --  4.3   No results for input(s): LIPASE, AMYLASE in the last 8760 hours. No results for input(s): AMMONIA in the last 8760 hours. CBC: Recent Labs    02/15/23 1057 03/15/23 1025 09/07/23 0807 11/24/23 1317  WBC 4.3 7.7 6.2 7.3  NEUTROABS 1.7 5.5 4,179  --   HGB 12.6 12.5 13.3 13.2  HCT 36.7 36.5 39.4 39.7  MCV 98.1 97.6 98.5 101*  PLT 242 213 194 227   Lipid Panel: Recent Labs    09/07/23 0807  CHOL 131  HDL  52  LDLCALC 61  TRIG 96  CHOLHDL 2.5   Lab Results  Component Value Date   HGBA1C 5.8 (H) 12/11/2023    Procedures since last visit: No results found.  Assessment/Plan 1. Primary insomnia (Primary) Trazodone  seems to be helping  with Melatonin Can take Half to full tablet as needed   2. Vitamin B 12 deficiency Restart B 12 - CBC with Differential/Platelet - Vitamin B12  3. Elevated blood sugar A1C is good  4. Senile osteoporosis Prolia  in 03/26 - COMPLETE METABOLIC PANEL WITHOUT GFR  5. Mixed hyperlipidemia Statin - Lipid panel - Vitamin B12  6. Acquired hypothyroidism  - TSH - Vitamin B12  7. 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

## 2023-12-14 ENCOUNTER — Encounter (HOSPITAL_COMMUNITY)

## 2023-12-14 ENCOUNTER — Encounter: Payer: Self-pay | Admitting: Internal Medicine

## 2023-12-15 ENCOUNTER — Ambulatory Visit (HOSPITAL_COMMUNITY)
Admission: RE | Admit: 2023-12-15 | Discharge: 2023-12-15 | Disposition: A | Discharge status: Home / Self Care | Source: Ambulatory Visit | Attending: Physician Assistant | Admitting: Physician Assistant

## 2023-12-15 ENCOUNTER — Encounter (HOSPITAL_COMMUNITY)

## 2023-12-15 DIAGNOSIS — R5383 Other fatigue: Secondary | ICD-10-CM | POA: Diagnosis not present

## 2023-12-15 DIAGNOSIS — E7800 Pure hypercholesterolemia, unspecified: Secondary | ICD-10-CM | POA: Diagnosis not present

## 2023-12-15 DIAGNOSIS — I2510 Atherosclerotic heart disease of native coronary artery without angina pectoris: Secondary | ICD-10-CM | POA: Insufficient documentation

## 2023-12-15 LAB — MYOCARDIAL PERFUSION IMAGING
LV dias vol: 58 mL (ref 46–106)
LV sys vol: 10 mL (ref 3.8–5.2)
Nuc Stress EF: 83 %
Peak HR: 85 {beats}/min
Rest HR: 59 {beats}/min
Rest Nuclear Isotope Dose: 10.9 mCi
SDS: 2
SRS: 1
SSS: 3
ST Depression (mm): 0 mm
Stress Nuclear Isotope Dose: 30.7 mCi
TID: 1.04

## 2023-12-15 MED ORDER — REGADENOSON 0.4 MG/5ML IV SOLN
0.4000 mg | Freq: Once | INTRAVENOUS | Status: AC
  Administered 2023-12-15: 0.4 mg via INTRAVENOUS

## 2023-12-15 MED ORDER — TECHNETIUM TC 99M TETROFOSMIN IV KIT
10.9000 | PACK | Freq: Once | INTRAVENOUS | Status: AC | PRN
  Administered 2023-12-15: 10.9 via INTRAVENOUS

## 2023-12-15 MED ORDER — TECHNETIUM TC 99M TETROFOSMIN IV KIT
30.7000 | PACK | Freq: Once | INTRAVENOUS | Status: AC | PRN
  Administered 2023-12-15: 30.7 via INTRAVENOUS

## 2023-12-15 MED ORDER — REGADENOSON 0.4 MG/5ML IV SOLN
INTRAVENOUS | Status: AC
  Filled 2023-12-15: qty 5

## 2023-12-19 ENCOUNTER — Ambulatory Visit: Payer: Self-pay | Admitting: Physician Assistant

## 2023-12-19 DIAGNOSIS — R9389 Abnormal findings on diagnostic imaging of other specified body structures: Secondary | ICD-10-CM

## 2023-12-19 DIAGNOSIS — R918 Other nonspecific abnormal finding of lung field: Secondary | ICD-10-CM

## 2023-12-20 ENCOUNTER — Encounter

## 2023-12-21 ENCOUNTER — Encounter

## 2023-12-21 VITALS — BP 134/73 | HR 57 | Temp 98.1°F | Resp 16 | Wt 156.0 lb

## 2023-12-21 DIAGNOSIS — Z006 Encounter for examination for normal comparison and control in clinical research program: Secondary | ICD-10-CM

## 2023-12-21 MED ORDER — STUDY - OCEAN(A) - OLPASIRAN (AMG 890) 142 MG/ML OR PLACEBO SQ INJECTION (PI-HILTY)
142.0000 mg | PREFILLED_SYRINGE | Freq: Once | SUBCUTANEOUS | Status: AC
  Administered 2023-12-21: 142 mg via SUBCUTANEOUS
  Filled 2023-12-21: qty 1

## 2023-12-21 NOTE — Research (Signed)
 [name]  Week 132  Vitals: [x]  Med reviews: [x]  Experience any AE/SAE/Hospitalizations []  Yes [x]  No  If yes please explain:   Discussed with patient about the importance of not letting any one draw cholesterol or lipids. As to we are blinded to results. Reassured patient if we needed to be contacted the study team would reach out to our unblinded person.   Added trazodone  to her med list, and Vitamin B12.  She is not sleeping   ~MD to see and fasting labs next visit 144~  Non-Fatal Potential Endpoint Assessment Yes  No   Has the subject experienced/undergone any of the following since the last visit/contact?   []   [x]    Any Coronary Artery Revascularization/Cerebrovascular Revascularization/ Peripheral Artery Revascularization/Amputation Procedure   []   [x]    Myocardial Infarction []   [x]    Stroke   []   [x]    Provide the date for the non-fatal Potential Endpoints status as of today visit date    []   [x]     IP admin please see MAR (please add Box # to comment section on MAR) [x]     Current Outpatient Medications:    acetaminophen  (TYLENOL ) 500 MG tablet, Take 500-1,000 mg by mouth every 6 (six) hours as needed (pain.)., Disp: , Rfl:    ascorbic acid  (VITAMIN C) 500 MG tablet, Take 500 mg by mouth daily as needed (immune support (sore throat))., Disp: , Rfl:    Calcium  Carbonate-Vitamin D  (CALCIUM  600+D PO), Take 1 tablet by mouth in the morning., Disp: , Rfl:    carboxymethylcellulose (REFRESH PLUS) 0.5 % SOLN, Place 1 drop into both eyes 3 (three) times daily as needed (dry/irritated eyes.)., Disp: , Rfl:    cholecalciferol  (VITAMIN D3) 25 MCG (1000 UNIT) tablet, Take 1,000 Units by mouth in the morning., Disp: , Rfl:    clopidogrel  (PLAVIX ) 75 MG tablet, TAKE 1 TABLET BY MOUTH DAILY, Disp: 90 tablet, Rfl: 3   cyanocobalamin (VITAMIN B12) 1000 MCG tablet, Take 1 tablet (1,000 mcg total) by mouth daily., Disp: , Rfl:    denosumab  (PROLIA ) 60 MG/ML SOSY injection, Inject 60 mg  into the skin every 6 (six) months. Administered at [facility] q55m, Disp: , Rfl:    ferrous sulfate 325 (65 FE) MG tablet, Take 325 mg by mouth every evening., Disp: , Rfl:    gabapentin  (NEURONTIN ) 100 MG capsule, TAKE 1 CAPSULE BY MOUTH DAILY, Disp: 90 capsule, Rfl: 1   levothyroxine  (SYNTHROID ) 112 MCG tablet, TAKE 1 TABLET BY MOUTH DAILY, Disp: 90 tablet, Rfl: 3   loratadine  (CLARITIN ) 10 MG tablet, Take 10 mg by mouth as needed for allergies., Disp: , Rfl:    melatonin 5 MG TABS, Take 5 mg by mouth at bedtime., Disp: , Rfl:    Multiple Vitamin (MULTIVITAMIN WITH MINERALS) TABS tablet, Take 1 tablet by mouth every evening., Disp: , Rfl:    nitroGLYCERIN  (NITROSTAT ) 0.4 MG SL tablet, DISSOLVE 1 TAB UNDER TONGUE FOR CHEST PAIN - IF PAIN REMAINS AFTER 5 MIN, CALL 911 AND REPEAT DOSE. MAX 3 TABS IN 15 MINUTES, Disp: 25 tablet, Rfl: 6   rosuvastatin  (CRESTOR ) 20 MG tablet, TAKE 1 TABLET BY MOUTH DAILY, Disp: 90 tablet, Rfl: 3   Study - [name] - olpasiran (AMG 890) 142 mg/mL or placebo SQ injection (PI-Hilty), Inject 142 mg into the skin once. For investigational use only - Inject 1 mL (1 prefilled syringe) subcutaneously into appropriate injection site per protocol. (Approved injection site(s): upper arm, upper thigh, abdomen).  Please contact [facility] if any questions., Disp: , Rfl:    traZODone  (DESYREL ) 50 MG tablet, Take 0.5 tablets (25 mg total) by mouth at bedtime as needed for sleep. Can take 25 -50 Mg t night as needed, Disp: 90 tablet, Rfl: 3   clobetasol ointment (TEMOVATE) 0.05 %, Apply 1 Application topically 4 days., Disp: , Rfl:   Current Facility-Administered Medications:    denosumab  (PROLIA ) injection 60 mg, 60 mg, Subcutaneous, Q6 months, Charlanne, Anjali L, MD   Study - [name] - olpasiran (AMG 890) 142 mg/mL or placebo SQ injection (PI-Hilty), 142 mg, Subcutaneous, Once,

## 2023-12-25 ENCOUNTER — Encounter: Payer: Self-pay | Admitting: Adult Health

## 2024-01-03 ENCOUNTER — Ambulatory Visit (HOSPITAL_COMMUNITY)

## 2024-01-08 ENCOUNTER — Ambulatory Visit (HOSPITAL_COMMUNITY)
Admission: RE | Admit: 2024-01-08 | Discharge: 2024-01-08 | Disposition: A | Source: Ambulatory Visit | Attending: Physician Assistant | Admitting: Physician Assistant

## 2024-01-08 DIAGNOSIS — R9389 Abnormal findings on diagnostic imaging of other specified body structures: Secondary | ICD-10-CM | POA: Insufficient documentation

## 2024-01-08 DIAGNOSIS — K449 Diaphragmatic hernia without obstruction or gangrene: Secondary | ICD-10-CM | POA: Diagnosis not present

## 2024-01-08 DIAGNOSIS — R911 Solitary pulmonary nodule: Secondary | ICD-10-CM | POA: Insufficient documentation

## 2024-01-08 DIAGNOSIS — J9811 Atelectasis: Secondary | ICD-10-CM | POA: Diagnosis not present

## 2024-01-08 DIAGNOSIS — R918 Other nonspecific abnormal finding of lung field: Secondary | ICD-10-CM | POA: Insufficient documentation

## 2024-01-08 DIAGNOSIS — N281 Cyst of kidney, acquired: Secondary | ICD-10-CM | POA: Diagnosis not present

## 2024-01-08 DIAGNOSIS — I2510 Atherosclerotic heart disease of native coronary artery without angina pectoris: Secondary | ICD-10-CM | POA: Insufficient documentation

## 2024-01-12 ENCOUNTER — Telehealth: Payer: Self-pay

## 2024-01-12 ENCOUNTER — Ambulatory Visit: Payer: Self-pay

## 2024-01-12 NOTE — Telephone Encounter (Signed)
 I left patient a detailed message with the following information  1.) Last OV on 12/13/23 indicates labs will be collected on 04/08/24 @ 8 am, no indication that labs are due prior to December appointment  2.) Glendia Ferrier, PA ordered CT and patient needs to call that office to obtain results  If patient calls back [id number] agent to share the above with patient

## 2024-01-12 NOTE — Telephone Encounter (Signed)
 Patient disconnected before warm transfer was complete.   Copied from CRM #[id number]. Topic: Clinical - Red Word Triage >> Jan 12, 2024  9:13 AM Miquel SAILOR wrote: Red Word that prompted transfer to Nurse Triage: Pain in LT forearm going to hand in wrist since 1 month but getting worst 1-2 days Piano statrted 2-3 month ago as well

## 2024-01-12 NOTE — Telephone Encounter (Signed)
 FYI Only or Action Required?: FYI only for provider: declined appointment now, requesting to discuss acute issue at upcoming appointment on 02/07/24.  Patient was last seen in primary care on 12/13/2023 by Charlanne Fredia CROME, MD.  Called Nurse Triage reporting Arm Pain.  Symptoms began 2 months ago.  Interventions attempted: Nothing.  Symptoms are: gradually worsening.  Triage Disposition: See PCP Within 2 Weeks  Patient/caregiver understands and will follow disposition?: No, refuses disposition                   Reason for Disposition  [1] MILD pain (e.g., does not interfere with normal activities) AND [2] present > 7 days  Answer Assessment - Initial Assessment Questions Patient declined appointment at this time. Patient stated she is already supposed to see her PCP on 02/07/24 and would rather wait until that appointment to discuss acute issue. This RN advised patient to monitor symptoms in the meantime and call back if anything worsens. Patient verbalized understanding.   1. ONSET: When did the pain start?     2 months, worsening 2. LOCATION: Where is the pain located?     Left arm from forearm to wrist 3. PAIN: How bad is the pain? (Scale 1-10; or mild, moderate, severe)     Sore to touch, describes pain as dull, rates pain a 3 4. WORK OR EXERCISE: Has there been any recent work or exercise that involved this part (i.e., hand or wrist) of the body?     States she started piano lessons 2 months ago 5. CAUSE: What do you think is causing the pain?     Unsure, states she had lymph nodes removed from this arm about a year ago 6. AGGRAVATING FACTORS: What makes the pain worse? (e.g., using computer)     Touching arm or turning palm upwards  7. OTHER SYMPTOMS: Do you have any other symptoms? (e.g., fever, neck pain, numbness or tingling, rash, swelling)     Denies swelling, denies redness, denies numbness, denies weakness 8. PREGNANCY: Is there any  chance you are pregnant? When was your last menstrual period?     N/A  Protocols used: Wrist Pain-A-AH

## 2024-01-12 NOTE — Telephone Encounter (Signed)
 Copied from CRM #[id number]. Topic: Clinical - Lab/Test Results >> Jan 12, 2024  9:12 AM Miquel SAILOR wrote: Reason for CRM: PT needs lab app before hr app on 12/03. Needs call abck for Lab scheudling 1 week before  CT CHEST WO CONTRAST (Accession [id number]) (Order [id number])-EU Needs  call back on results for 11/03.   [phone]

## 2024-01-15 ENCOUNTER — Ambulatory Visit: Payer: Self-pay | Admitting: Physician Assistant

## 2024-01-16 DIAGNOSIS — H26493 Other secondary cataract, bilateral: Secondary | ICD-10-CM | POA: Diagnosis not present

## 2024-01-16 DIAGNOSIS — H04123 Dry eye syndrome of bilateral lacrimal glands: Secondary | ICD-10-CM | POA: Diagnosis not present

## 2024-01-17 ENCOUNTER — Ambulatory Visit: Attending: Hematology and Oncology | Admitting: Rehabilitation

## 2024-01-17 ENCOUNTER — Telehealth: Payer: Self-pay

## 2024-01-17 DIAGNOSIS — Z483 Aftercare following surgery for neoplasm: Secondary | ICD-10-CM | POA: Insufficient documentation

## 2024-01-17 DIAGNOSIS — C50412 Malignant neoplasm of upper-outer quadrant of left female breast: Secondary | ICD-10-CM | POA: Insufficient documentation

## 2024-01-17 DIAGNOSIS — Z171 Estrogen receptor negative status [ER-]: Secondary | ICD-10-CM | POA: Insufficient documentation

## 2024-01-17 NOTE — Therapy (Signed)
 OUTPATIENT PHYSICAL THERAPY SOZO SCREENING NOTE   Patient Name: Jessica [name] MRN: [id number] DOB:[birth year], 83 y.o., female Today's Date: 01/17/2024  PCP: Charlanne Fredia CROME, MD REFERRING PROVIDER: Odean Potts, MD   PT End of Session - 01/17/24 1145     Visit Number 11   Screen   Number of Visits 12    Date for Recertification  03/13/24    PT Start Time 1102    PT Stop Time 1135    PT Time Calculation (min) 33 min           Past Medical History:  Diagnosis Date   Anemia 12/2015   Bronchitis, chronic (HCC)    in her 74s   Cancer (HCC)    breast   Cataract    Chronic kidney disease    in 20s had nephritis   Closed fracture of right distal humerus    right   Coronary artery disease    Elevated Lp(a)    Hand, foot and mouth disease (HFMD)    Hearing loss 05/03/2016   High cholesterol    Hypothyroidism    Myocardial infarction (HCC)    Neuropathy    toes   Osteopenia    Pericarditis 01/1999   Pneumothorax 01/1999   Thyroid  disorder    Vitamin B 12 deficiency    Past Surgical History:  Procedure Laterality Date   birthmark      removed,left leg   BREAST BIOPSY Left 09/13/2022   US  LT BREAST BX W LOC DEV 1ST LESION IMG BX SPEC US  GUIDE 09/13/2022 [facility] MAMMOGRAPHY   CARDIAC CATHETERIZATION     CATARACT EXTRACTION  2017   CORONARY STENT INTERVENTION N/A 09/01/2020   Procedure: CORONARY STENT INTERVENTION;  Surgeon: Claudene Victory ORN, MD;  [facility];  Service: Cardiovascular;  Laterality: N/A;   LEFT HEART CATH AND CORONARY ANGIOGRAPHY N/A 09/01/2020   Procedure: LEFT HEART CATH AND CORONARY ANGIOGRAPHY;  Surgeon: Claudene Victory ORN, MD;  [facility];  Service: Cardiovascular;  Laterality: N/A;   ORIF HUMERUS FRACTURE Right 04/10/2019   Procedure: RIGHT OPEN REDUCTION INTERNAL FIXATION (ORIF) DISTAL HUMERUS FRACTURE WITH EXTENSION;  Surgeon: Cristy Bonner DASEN, MD;  Location: [facility];  Service: Orthopedics;   Laterality: Right;   PORTACATH PLACEMENT N/A 11/15/2022   Procedure: PORT PLACEMENT WITH ULTRASOUND GUIDANCE;  Surgeon: Vanderbilt Ned, MD;  [facility]: [facility];  Service: General;  Laterality: N/A;   SIMPLE MASTECTOMY WITH AXILLARY SENTINEL NODE BIOPSY Bilateral 10/18/2022   Procedure: BILATERAL SIMPLE MASTECTOMY;  Surgeon: Vanderbilt Ned, MD;  Location: [facility];  Service: General;  Laterality: Bilateral;  PEC BLOCK   TONSILLECTOMY AND ADENOIDECTOMY  1952?   UMBILICAL HERNIA REPAIR N/A 09/20/2023   Procedure: REPAIR, HERNIA, UMBILICAL, ADULT;  Surgeon: Lyndel Deward PARAS, MD;  Location: [facility];  Service: General;  Laterality: N/A;   Patient Active Problem List   Diagnosis Date Noted   Blurry vision, bilateral 08/30/2023   Paresthesia 08/30/2023   Malignant neoplasm of upper-outer quadrant of left breast in female, estrogen receptor negative (HCC) 09/21/2022   CAD (coronary artery disease) 04/08/2022   Swelling of left foot 04/07/2022   Left hand fracture, sequela 04/07/2022   Depression with anxiety 04/07/2022   NSTEMI (non-ST elevated myocardial infarction) (HCC) 09/01/2020   Closed fracture of right distal humerus 04/10/2019   Dyspnea 05/09/2018   Vitamin B 12 deficiency 05/09/2018   Cough 03/06/2018   Senile osteoporosis 11/20/2017   Iron  deficiency 04/26/2017   Lichen sclerosus 04/26/2017   External hemorrhoid 04/26/2017   Weakness of left leg 06/07/2016   Balance disorder 05/24/2016   Memory deficit 05/24/2016   Anemia 05/19/2016   Hearing loss 05/03/2016   Hypothyroidism 07/24/2013   Bradycardia on ECG 07/24/2013   Numbness 08/07/2012   Pure hypercholesterolemia 12/29/2006    REFERRING DIAG: left breast cancer at risk for lymphedema  THERAPY DIAG: Aftercare following surgery for neoplasm  Malignant neoplasm of upper-outer quadrant of left breast in female, estrogen receptor negative (HCC)  PERTINENT HISTORY: Patient was diagnosed on 09/13/2022 with left  grade 2 invasive lobular carcinoma breast cancer. She underwent a bilateral mastectomy and left sentinel node biopsy (7 negative nodes) on 10/18/2022. It is triple negative with a Ki67 of 5%. She has some cardiac history and had an elbow fracture in 2022 resulting in surgery where pins were placed.   PRECAUTIONS: left UE Lymphedema risk, None  SUBJECTIVE: Pt returns due to pain in her forearm and axilla that began ~2 months ago. The patient noticed some swelling in the UE/axilla yesterday and would like to get it checked out today. The patient reports that she recently began playing the piano and going to the gym with a personal trainer. The patient reports she is lifting 5-10 pounds during her personal training sessions.   PAIN:  Are you having pain? No  SOZO SCREENING: Patient was assessed today using the SOZO machine to determine the lymphedema index score. This was compared to her baseline score. It was determined that her score is elevated when compared to her baseline. The patient is educated to wear her compression sleeve daily, making sure to take it off when she goes to bed, and to decrease her weight to 2-3 pounds at the gym to not overload her lymphatic system. The patient is taught how to independently don and doff her compression sleeve and instructed to return in 30 days for another SOZO screen.    L-DEX FLOWSHEETS - 01/17/24 1100       L-DEX LYMPHEDEMA SCREENING   Measurement Type Unilateral    L-DEX MEASUREMENT EXTREMITY Upper Extremity    POSITION  Standing    DOMINANT SIDE Right    At Risk Side Left    BASELINE SCORE (UNILATERAL) -2.2    L-DEX SCORE (UNILATERAL) 8.2    VALUE CHANGE (UNILAT) 10.4          Randall Pack, SPT  01/17/2024, 11:48 AM  I agree with the following treatment note after reviewing documentation. This session was performed under the supervision of a licensed clinician.  Saddie Raw, PT 01/17/24, 11:48 AM

## 2024-01-17 NOTE — Telephone Encounter (Signed)
 Copied from CRM [id number]. Topic: Clinical - Lab/Test Results >> Jan 17, 2024  4:20 PM Fredrica W wrote: Reason for CRM: Glendia called from [facility] care. Also left message for Dr Charlanne in [place]. Would like provider to know about recent abnormal labs and that cardio is unable to assist with and see if someone can reach out to patient to follow up. Can contact him with any questions or concerns via phone or message in EPIC. Thank You

## 2024-01-18 ENCOUNTER — Encounter: Payer: Self-pay | Admitting: Nurse Practitioner

## 2024-01-19 ENCOUNTER — Encounter: Payer: Self-pay | Admitting: Internal Medicine

## 2024-01-19 ENCOUNTER — Ambulatory Visit: Admitting: Internal Medicine

## 2024-01-19 VITALS — BP 118/80 | HR 63 | Temp 97.4°F | Ht 63.0 in | Wt 157.0 lb

## 2024-01-19 DIAGNOSIS — N281 Cyst of kidney, acquired: Secondary | ICD-10-CM | POA: Diagnosis not present

## 2024-01-19 DIAGNOSIS — I2510 Atherosclerotic heart disease of native coronary artery without angina pectoris: Secondary | ICD-10-CM | POA: Diagnosis not present

## 2024-01-19 DIAGNOSIS — R9389 Abnormal findings on diagnostic imaging of other specified body structures: Secondary | ICD-10-CM

## 2024-01-19 DIAGNOSIS — E538 Deficiency of other specified B group vitamins: Secondary | ICD-10-CM | POA: Diagnosis not present

## 2024-01-19 DIAGNOSIS — I890 Lymphedema, not elsewhere classified: Secondary | ICD-10-CM

## 2024-01-19 NOTE — Assessment & Plan Note (Addendum)
 The abnormalities found on CT scan do not indicate active, progressive disease.  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  If you have significant lower respiratory tract symptoms such as significantly productive cough or shortness of breath; chest x-ray should be repeated.  Otherwise there is no need for surveillance.   Another incidental finding was a 6.3 cm cyst in the right kidney.  Your kidney function is normal for your age.  This would need urologic monitor should you have any intra-abdominal symptoms.

## 2024-01-19 NOTE — Assessment & Plan Note (Addendum)
 I will contact Dr. Charlanne to see if she wants to repeat the B12 level when you see her next month.  If the B12 does not normalize with the oral supplement; sublingual or intranasal B12 supplementation would be needed.  If either of those forms does not correct the deficiency; IM or subcu administration would be necessary.

## 2024-01-19 NOTE — Assessment & Plan Note (Addendum)
 Bosniak criteria for renal cyst reviewed; clinically this would appear to be Category 1.  I'll try to contact the Radiologist to see if there is really any indication for additional renal imaging.

## 2024-01-19 NOTE — Assessment & Plan Note (Signed)
 She denies active anginal equivalent.  Focus on secondary prevention discussed with her.

## 2024-01-19 NOTE — Progress Notes (Signed)
 PCP: Charlanne Fredia CROME, MD    Interim medical record and care since last clinic visit was updated with review of diagnostic studies and change in clinical status since last visit were documented.  HPI: The initial reason stated for this visit was to review 11/3 CT chest scan results, but Jessica [name] has several concerns.  Cardiology PA Glendia Ferrier had performed myocardial perfusion study on 10/10.  The study did not suggest unstable angina; but  an incidental finding was noted which was attributed to possible fluid accumulation or infection.  For this reason a follow-up CT of the chest was pursued.  The report as well as the images were reviewed with her.  Bilateral fat-containing hernias were present along with some associated atelectasis in the lower lobes.  In 2000 Jessica [name] had pericarditis and pneumothorax.  Additionally there is a 2 mm nodule in the lower lobe.  Also a  6.3 cm cyst is present in the right kidney.  Radiologic interpretation was most likely simple cyst but, it was only partially imaged.  Review of systems: Jessica [name] denies any active cardiopulmonary symptoms or shortness of breath or GU symptoms.  The CT did show coronary atherosclerotic changes.  Jessica [name] is concerned about a recent diagnosis of lymphedema of the left upper extremity in the context of lymph node resection at her breast cancer surgery.  PT has prescribed a sleeve to help control the lymphedema.  Jessica [name] had also been instructed to employ dumbbell weights up to 2 pounds.  Jessica [name] has noted that when Jessica [name] does those exercises and also when Jessica [name] is playing the piano , there is subtle swelling of the left hand. Although Jessica [name] is not anemic; Jessica [name] has been diagnosed with vitamin B12 deficiency on 10/6 and is on oral supplement.  Constitutional: No fever, significant weight change, fatigue  Eyes: No redness, discharge, pain, vision change ENT/mouth: No nasal congestion,  purulent discharge, earache, change in hearing, sore throat  Cardiovascular: No  chest pain, palpitations, paroxysmal nocturnal dyspnea, claudication  Respiratory: No cough, sputum production, hemoptysis, D @ rest or on exertion, significant snoring, apnea   Gastrointestinal: No heartburn, dysphagia, abdominal pain, nausea /vomiting, rectal bleeding, melena, change in bowels Genitourinary: No dysuria, hematuria, pyuria, incontinence, nocturia Musculoskeletal: No joint stiffness, joint swelling, weakness, pain Dermatologic: No rash, pruritus, change in appearance of skin Neurologic: No dizziness, headache, syncope, seizures, numbness, tingling Psychiatric: No significant anxiety, depression, insomnia, anorexia Endocrine: No change in hair/skin/nails, excessive thirst, excessive hunger, excessive urination  Hematologic/lymphatic: No significant bruising, lymphadenopathy, abnormal bleeding  Physical exam:  Pertinent or positive findings: Jessica [name] appears younger than her stated age.  Heart sounds are distant.  Pedal pulses are decreased.  Jessica [name] has no lymphadenopathy to palpation.  Jessica [name] is wearing the LUE sleeve.  Subtle swelling of the dorsum of the left hand is suggested.  General appearance: Adequately nourished; no acute distress, increased work of breathing is present.   Lymphatic: No lymphadenopathy about the head, neck, axilla. Eyes: No conjunctival inflammation or lid edema is present. There is no scleral icterus. Ears:  External ear exam shows no significant lesions or deformities.   Nose:  External nasal examination shows no deformity or inflammation. Nasal mucosa are pink and moist without lesions, exudates Oral exam:  Lips and gums are healthy appearing. There is no oropharyngeal erythema or exudate. Neck:  No thyromegaly, masses, tenderness noted.    Heart:  Normal rate and regular rhythm. S1 and S2 normal without gallop, murmur, click, rub .  Lungs:  Chest clear to auscultation without wheezes, rhonchi, rales, rubs. Abdomen: Bowel sounds are normal. Abdomen is soft and  nontender with no organomegaly, hernias, masses. GU: Deferred  Extremities:  No cyanosis, clubbing  Skin: Warm & dry w/o tenting. No significant lesions or rash.  See summary under each active problem in the Problem List with associated updated therapeutic plan:  Abnormal CT scan, chest The abnormalities found on CT scan do not indicate active, progressive disease.  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  If you have significant lower respiratory tract symptoms such as significantly productive cough or shortness of breath; chest x-ray should be repeated.  Otherwise there is no need for surveillance.   Another incidental finding was a 6.3 cm cyst in the right kidney.  Your kidney function is normal for your age.  This would need urologic monitor should you have any intra-abdominal symptoms.  Vitamin B 12 deficiency I will contact Dr. Charlanne to see if Jessica [name] wants to repeat the B12 level when you see her next month.  If the B12 does not normalize with the oral supplement; sublingual or intranasal B12 supplementation would be needed.  If either of those forms does not correct the deficiency; IM or subcu administration would be necessary.  Lymphedema PT has prescribed a LUE sleeve.  F/U in 1 month as scheduled. Avoid dependency of L arm.  Please notify PT of the increased swelling and discomfort with activity such as lifting the dumbbells, pulling on items, or playing the piano.  They will need to assess the risk/benefit ratio of such activities in the context of the lymphedema.  Renal cyst, acquired, right Bosniak criteria for renal cyst reviewed; clinically this would appear to be Category 1.  I'll try to contact the Radiologist to see if there is really any indication for additional renal imaging.

## 2024-01-19 NOTE — Assessment & Plan Note (Addendum)
 PT has prescribed a LUE sleeve.  F/U in 1 month as scheduled. Avoid dependency of L arm.  Please notify PT of the increased swelling and discomfort with activity such as lifting the dumbbells, pulling on items, or playing the piano.  They will need to assess the risk/benefit ratio of such activities in the context of the lymphedema.

## 2024-01-19 NOTE — Patient Instructions (Signed)
 Abnormal CT scan, chest The abnormalities found on CT scan do not indicate active, progressive disease.  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  If you have significant lower respiratory tract symptoms such as significantly productive cough or shortness of breath; chest x-ray should be repeated.  Otherwise there is no need for surveillance.   Another incidental finding was a 6.3 cm cyst in the right kidney.  Your kidney function is normal for your age.  This would need urologic monitor should you have any intra-abdominal symptoms.  Vitamin B 12 deficiency I will contact Dr. Charlanne to see if she wants to repeat the B12 level when you see her next month.  Lymphedema PT has prescribed a LUE sleeve.  F/U in 1 month as scheduled. Avoid dependency of L arm.  Please notify PT of the increased swelling and discomfort with activity such as lifting the dumbbells, pulling on items, or playing the piano.  They will need to assess the risk/benefit ratio of activities in the context of the lymphedema.

## 2024-01-22 ENCOUNTER — Ambulatory Visit: Admitting: Rehabilitation

## 2024-01-22 DIAGNOSIS — Z483 Aftercare following surgery for neoplasm: Secondary | ICD-10-CM

## 2024-01-22 DIAGNOSIS — C50412 Malignant neoplasm of upper-outer quadrant of left female breast: Secondary | ICD-10-CM

## 2024-01-22 NOTE — Therapy (Signed)
 OUTPATIENT PHYSICAL THERAPY SOZO SCREENING NOTE   Patient Name: Jessica [name] MRN: [id number] DOB:1940-10-31, 83 y.o., female Today's Date: 01/22/2024  PCP: Charlanne Fredia CROME, MD REFERRING PROVIDER: Odean Potts, MD   PT End of Session - 01/22/24 1214     Visit Number 11   screen only   PT Start Time 1150    PT Stop Time 1200    PT Time Calculation (min) 10 min    Activity Tolerance Patient tolerated treatment well    Behavior During Therapy [facility] for tasks assessed/performed           Past Medical History:  Diagnosis Date   Anemia 12/2015   Breast cancer (HCC)    Bronchitis, chronic (HCC)    in her 72s   Cancer (HCC)    breast   Cataract    Chronic kidney disease    in 20s had nephritis   Closed fracture of right distal humerus    right   Coronary artery disease    Elevated Lp(a)    Hand, foot and mouth disease (HFMD)    Hearing loss 05/03/2016   High cholesterol    Hypothyroidism    Myocardial infarction (HCC)    Neuropathy    toes   Osteopenia    Pericarditis 01/1999   Pneumothorax 01/1999   Thyroid  disorder    Vitamin B 12 deficiency    Past Surgical History:  Procedure Laterality Date   birthmark      removed,left leg   BREAST BIOPSY Left 09/13/2022   US  LT BREAST BX W LOC DEV 1ST LESION IMG BX SPEC US  GUIDE 09/13/2022 [facility]   CARDIAC CATHETERIZATION     CATARACT EXTRACTION  2017   CORONARY STENT INTERVENTION N/A 09/01/2020   Procedure: CORONARY STENT INTERVENTION;  Surgeon: Claudene Victory ORN, MD;  Location: [facility];  Service: Cardiovascular;  Laterality: N/A;   LEFT HEART CATH AND CORONARY ANGIOGRAPHY N/A 09/01/2020   Procedure: LEFT HEART CATH AND CORONARY ANGIOGRAPHY;  Surgeon: Claudene Victory ORN, MD;  Location: [facility];  Service: Cardiovascular;  Laterality: N/A;   ORIF HUMERUS FRACTURE Right 04/10/2019   Procedure: RIGHT OPEN REDUCTION INTERNAL FIXATION (ORIF) DISTAL HUMERUS FRACTURE WITH EXTENSION;  Surgeon: Cristy Bonner DASEN, MD;  Location: [facility];  Service: Orthopedics;  Laterality: Right;   PORTACATH PLACEMENT N/A 11/15/2022   Procedure: PORT PLACEMENT WITH ULTRASOUND GUIDANCE;  Surgeon: Vanderbilt Ned, MD;  Location: [facility] OR;  Service: General;  Laterality: N/A;   SIMPLE MASTECTOMY WITH AXILLARY SENTINEL NODE BIOPSY Bilateral 10/18/2022   Procedure: BILATERAL SIMPLE MASTECTOMY;  Surgeon: Vanderbilt Ned, MD;  Location: [facility];  Service: General;  Laterality: Bilateral;  PEC BLOCK   TONSILLECTOMY AND ADENOIDECTOMY  1952?   UMBILICAL HERNIA REPAIR N/A 09/20/2023   Procedure: REPAIR, HERNIA, UMBILICAL, ADULT;  Surgeon: Lyndel Deward PARAS, MD;  Location: [facility];  Service: General;  Laterality: N/A;   Patient Active Problem List   Diagnosis Date Noted   Abnormal CT scan, chest 01/19/2024   Lymphedema 01/19/2024   Renal cyst, acquired, right 01/19/2024   Blurry vision, bilateral 08/30/2023   Paresthesia 08/30/2023   Malignant neoplasm of upper-outer quadrant of left breast in female, estrogen receptor negative (HCC) 09/21/2022   CAD (coronary artery disease) 04/08/2022   Swelling of left foot 04/07/2022   Left hand fracture, sequela 04/07/2022   Depression with anxiety 04/07/2022   NSTEMI (non-ST elevated myocardial infarction) (HCC) 09/01/2020   Closed  fracture of right distal humerus 04/10/2019   Dyspnea 05/09/2018   Vitamin B 12 deficiency 05/09/2018   Cough 03/06/2018   Senile osteoporosis 11/20/2017   Iron deficiency 04/26/2017   Lichen sclerosus 04/26/2017   External hemorrhoid 04/26/2017   Weakness of left leg 06/07/2016   Balance disorder 05/24/2016   Memory deficit 05/24/2016   Anemia 05/19/2016   Hearing loss 05/03/2016   Hypothyroidism 07/24/2013   Bradycardia on ECG 07/24/2013   Numbness 08/07/2012   Pure hypercholesterolemia 12/29/2006    REFERRING DIAG: left breast cancer at risk for lymphedema  THERAPY DIAG: Aftercare following surgery for  neoplasm  Malignant neoplasm of upper-outer quadrant of left breast in female, estrogen receptor negative (HCC)  PERTINENT HISTORY: Patient was diagnosed on 09/13/2022 with 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 She underwent a bilateral mastectomy and left sentinel node biopsy (7 negative nodes) on 10/18/2022. It is triple negative with a Ki67 of 5%. She has some cardiac history and had an elbow fracture in 2022 resulting in surgery where pins were placed.   PRECAUTIONS: left UE Lymphedema risk, None  SUBJECTIVE: Pt feels like the hand is swelling with just the sleeve.   PAIN:  Are you having pain? No - but still having arm pain with piano.   SOZO SCREENING: Patient was assessed today using the SOZO machine to determine the lymphedema index score. This was compared to her baseline score. It was determined that her score is now back to WNL. The patient is educated to wear her compression sleeve daily as well as her gauntlet now.    L-DEX FLOWSHEETS - 01/22/24 1200       L-DEX LYMPHEDEMA SCREENING   Measurement Type Unilateral    L-DEX MEASUREMENT EXTREMITY Upper Extremity    POSITION  Standing    DOMINANT SIDE Right    At Risk Side Left    BASELINE SCORE (UNILATERAL) -2.2    L-DEX SCORE (UNILATERAL) 2.9    VALUE CHANGE (UNILAT) 5.1    Comment gave pt size 3 gauntlet           Saddie Raw, PT 01/22/24, 12:14 PM

## 2024-01-24 ENCOUNTER — Encounter: Payer: Self-pay | Admitting: *Deleted

## 2024-02-07 ENCOUNTER — Encounter: Payer: Self-pay | Admitting: Internal Medicine

## 2024-02-07 ENCOUNTER — Non-Acute Institutional Stay: Payer: Self-pay | Admitting: Internal Medicine

## 2024-02-07 VITALS — BP 122/74 | HR 58 | Temp 97.2°F | Ht 63.0 in | Wt 158.0 lb

## 2024-02-07 DIAGNOSIS — E039 Hypothyroidism, unspecified: Secondary | ICD-10-CM | POA: Diagnosis not present

## 2024-02-07 DIAGNOSIS — I2510 Atherosclerotic heart disease of native coronary artery without angina pectoris: Secondary | ICD-10-CM

## 2024-02-07 DIAGNOSIS — F5101 Primary insomnia: Secondary | ICD-10-CM | POA: Diagnosis not present

## 2024-02-07 DIAGNOSIS — E782 Mixed hyperlipidemia: Secondary | ICD-10-CM | POA: Diagnosis not present

## 2024-02-07 DIAGNOSIS — E538 Deficiency of other specified B group vitamins: Secondary | ICD-10-CM

## 2024-02-07 DIAGNOSIS — R7303 Prediabetes: Secondary | ICD-10-CM | POA: Diagnosis not present

## 2024-02-07 DIAGNOSIS — M810 Age-related osteoporosis without current pathological fracture: Secondary | ICD-10-CM

## 2024-02-07 NOTE — Patient Instructions (Addendum)
 Schedule Medicare Annual Wellness Visit.  Dr. Charlanne would like for you to schedule a 4 month follow up appointment here at [facility] Please have labs drawn 03/18/24 the lab is open 7:45am  - 8:15am.

## 2024-02-08 NOTE — Progress Notes (Signed)
 Location:  [facility] Curator of Service:   Clinic  Provider:   Code Status:  Goals of Care:     12/11/2023   10:06 AM  Advanced Directives  Does Patient Have a Medical Advance Directive? Yes  Type of Estate Agent of [place];Living will  Does patient want to make changes to medical advance directive? No - Patient declined  Copy of Healthcare Power of Attorney in Chart? Yes - validated most recent copy scanned in chart (See row information)     Chief Complaint  Patient presents with   Medical Management of Chronic Issues    Routine visit. Discuss a stroke last time she was here the conversation came up but was not discussed.     HPI: Patient is a 83 y.o. female seen today for medical management of chronic diseases.    Discussed the use of AI scribe software for clinical note transcription with the patient, who gave verbal consent to proceed.  History of Present Illness   Jessica [name] is an 83 year old female who presents for follow-up on   Lymphedema in Left Arm She is Bilateral Mastectomy Followed by Chemo and Radiation therapy Finished in 3/25 She has mild lymphedema that has improved. She uses compression garments and avoids blood pressure checks and blood draws in the affected arm. Insomnia She has chronic insomnia, waking at 2-3 AM and averaging 5-6 hours of sleep nightly. She takes trazodone  25 mg, usually half at bedtime and sometimes the other half if she wakes. This helps her sleep but at times causes morning drowsiness.  H/o CAD and NSTEMI 2022 She takes Plavix  and a statin for cholesterol. Low risk Stress test recently She attends exercise class twice weekly and walks when weather allows but feels more sluggish and less active recently.      Low B 12  On Supplement now  Neuropathy Has seen Dr Onita Gartner Worsened since Chemo  Past Medical History:  Diagnosis Date   Anemia 12/2015   Breast cancer (HCC)    Bronchitis,  chronic (HCC)    in her 89s   Cancer [facility])    breast   Cataract    Chronic kidney disease    in 20s had nephritis   Closed fracture of right distal humerus    right   Coronary artery disease    Elevated Lp(a)    Hand, foot and mouth disease (HFMD)    Hearing loss 05/03/2016   High cholesterol    Hypothyroidism 2008   Lymphedema of left arm    Myocardial infarction (HCC)    Neuropathy    toes   Osteopenia    Pericarditis 01/1999   Pneumothorax 01/1999   Thyroid  disorder    Vitamin B 12 deficiency     Past Surgical History:  Procedure Laterality Date   birthmark      removed,left leg   BREAST BIOPSY Left 09/13/2022   US  LT BREAST BX W LOC DEV 1ST LESION IMG BX SPEC US  GUIDE 09/13/2022 [facility] MAMMOGRAPHY   CARDIAC CATHETERIZATION     CATARACT EXTRACTION  2017   CORONARY STENT INTERVENTION N/A 09/01/2020   Procedure: CORONARY STENT INTERVENTION;  Surgeon: Claudene Victory ORN, MD;  [facility];  Service: Cardiovascular;  Laterality: N/A;   LEFT HEART CATH AND CORONARY ANGIOGRAPHY N/A 09/01/2020   Procedure: LEFT HEART CATH AND CORONARY ANGIOGRAPHY;  Surgeon: Claudene Victory ORN, MD;  [facility];  Service: Cardiovascular;  Laterality: N/A;   ORIF HUMERUS FRACTURE Right 04/10/2019   Procedure: RIGHT OPEN REDUCTION INTERNAL FIXATION (ORIF) DISTAL HUMERUS FRACTURE WITH EXTENSION;  Surgeon: Cristy Bonner DASEN, MD;  Location: [facility];  Service: Orthopedics;  Laterality: Right;   PORTACATH PLACEMENT N/A 11/15/2022   Procedure: PORT PLACEMENT WITH ULTRASOUND GUIDANCE;  Surgeon: Vanderbilt Ned, MD;  [facility];  Service: General;  Laterality: N/A;   SIMPLE MASTECTOMY WITH AXILLARY SENTINEL NODE BIOPSY Bilateral 10/18/2022   Procedure: BILATERAL SIMPLE MASTECTOMY;  Surgeon: Vanderbilt Ned, MD;  Location: [facility];  Service: General;  Laterality: Bilateral;  PEC BLOCK   TONSILLECTOMY AND ADENOIDECTOMY  1952?   UMBILICAL HERNIA  REPAIR N/A 09/20/2023   Procedure: REPAIR, HERNIA, UMBILICAL, ADULT;  Surgeon: Lyndel Deward PARAS, MD;  Location: [facility];  Service: General;  Laterality: N/A;    No Known Allergies  Outpatient Encounter Medications as of 02/07/2024  Medication Sig   acetaminophen  (TYLENOL ) 500 MG tablet Take 500-1,000 mg by mouth every 6 (six) hours as needed (pain.).   ascorbic acid  (VITAMIN C) 500 MG tablet Take 500 mg by mouth daily as needed (immune support (sore throat)).   Calcium  Carbonate-Vitamin D  (CALCIUM  600+D PO) Take 1 tablet by mouth in the morning.   carboxymethylcellulose (REFRESH PLUS) 0.5 % SOLN Place 1 drop into both eyes 3 (three) times daily as needed (dry/irritated eyes.).   cholecalciferol  (VITAMIN D3) 25 MCG (1000 UNIT) tablet Take 1,000 Units by mouth in the morning.   clobetasol ointment (TEMOVATE) 0.05 % Apply 1 Application topically as directed. Every 4 days   clopidogrel  (PLAVIX ) 75 MG tablet TAKE 1 TABLET BY MOUTH DAILY   cyanocobalamin  (VITAMIN B12) 1000 MCG tablet Take 1 tablet (1,000 mcg total) by mouth daily.   denosumab  (PROLIA ) 60 MG/ML SOSY injection Inject 60 mg into the skin every 6 (six) months. Administered at [facility] q86m   ferrous sulfate 325 (65 FE) MG tablet Take 325 mg by mouth every evening.   gabapentin  (NEURONTIN ) 100 MG capsule TAKE 1 CAPSULE BY MOUTH DAILY   levothyroxine  (SYNTHROID ) 112 MCG tablet TAKE 1 TABLET BY MOUTH DAILY   loratadine  (CLARITIN ) 10 MG tablet Take 10 mg by mouth as needed for allergies.   melatonin 5 MG TABS Take 5 mg by mouth at bedtime.   Multiple Vitamin (MULTIVITAMIN WITH MINERALS) TABS tablet Take 1 tablet by mouth every evening.   nitroGLYCERIN  (NITROSTAT ) 0.4 MG SL tablet DISSOLVE 1 TAB UNDER TONGUE FOR CHEST PAIN - IF PAIN REMAINS AFTER 5 MIN, CALL 911 AND REPEAT DOSE. MAX 3 TABS IN 15 MINUTES   rosuvastatin  (CRESTOR ) 20 MG tablet TAKE 1 TABLET BY MOUTH DAILY   Study - [name] - olpasiran (AMG 890) 142 mg/mL or  placebo SQ injection (PI-Hilty) Inject 142 mg into the skin once. For investigational use only - Inject 1 mL (1 prefilled syringe) subcutaneously into appropriate injection site per protocol. (Approved injection site(s): upper arm, upper thigh, abdomen). Please contact [facility] if any questions.   traZODone  (DESYREL ) 50 MG tablet Take 0.5 tablets (25 mg total) by mouth at bedtime as needed for sleep. Can take 25 -50 Mg t night as needed   Facility-Administered Encounter Medications as of 02/07/2024  Medication   denosumab  (PROLIA ) injection 60 mg    Review of Systems:  Review of Systems  Constitutional:  Negative for activity change and appetite change.  HENT: Negative.    Respiratory:  Negative for cough  and shortness of breath.   Cardiovascular:  Negative for leg swelling.  Gastrointestinal:  Negative for constipation.  Genitourinary: Negative.   Musculoskeletal:  Negative for arthralgias, gait problem and myalgias.  Skin: Negative.   Neurological:  Positive for weakness. Negative for dizziness.  Psychiatric/Behavioral:  Negative for confusion, dysphoric mood and sleep disturbance.     Health Maintenance  Topic Date Due   Medicare Annual Wellness (AWV)  07/11/2023   Colonoscopy  02/03/2026 (Originally 07/11/2020)   COVID-19 Vaccine (7 - Mixed Product risk 2025-26 season) 05/27/2024   DTaP/Tdap/Td (4 - Td or Tdap) 03/24/2032   Pneumococcal Vaccine: 50+ Years  Completed   Influenza Vaccine  Completed   Bone Density Scan  Completed   Zoster Vaccines- Shingrix  Completed   Meningococcal B Vaccine  Aged Out   Hepatitis B Vaccines 19-59 Average Risk  Discontinued    Physical Exam: Vitals:   02/07/24 1421  BP: 122/74  Pulse: (!) 58  Temp: (!) 97.2 F (36.2 C)  TempSrc: Temporal  SpO2: 96%  Weight: 158 lb (71.7 kg)  Height: 5' 3 (1.6 m)   Body mass index is 27.99 kg/m. Physical Exam Vitals reviewed.  Constitutional:      Appearance: Normal appearance.   HENT:     Head: Normocephalic.     Nose: Nose normal.     Mouth/Throat:     Mouth: Mucous membranes are moist.     Pharynx: Oropharynx is clear.  Eyes:     Pupils: Pupils are equal, round, and reactive to light.  Cardiovascular:     Rate and Rhythm: Normal rate and regular rhythm.     Pulses: Normal pulses.     Heart sounds: Normal heart sounds. No murmur heard. Pulmonary:     Effort: Pulmonary effort is normal.     Breath sounds: Normal breath sounds.  Abdominal:     General: Abdomen is flat. Bowel sounds are normal.     Palpations: Abdomen is soft.  Musculoskeletal:        General: No swelling.     Cervical back: Neck supple.  Skin:    General: Skin is warm.  Neurological:     General: No focal deficit present.     Mental Status: She is alert and oriented to person, place, and time.  Psychiatric:        Mood and Affect: Mood normal.        Thought Content: Thought content normal.     Labs reviewed: Basic Metabolic Panel: Recent Labs    03/15/23 1025 09/07/23 0807 11/24/23 1317  NA 139 139 140  K 4.3 4.1 4.3  CL 108 107 103  CO2 26 28 22   GLUCOSE 93 94 73  BUN 12 15 16   CREATININE 0.56 0.59* 0.57  CALCIUM  8.8* 8.7 9.2  TSH  --  1.39 1.400   Liver Function Tests: Recent Labs    02/15/23 1057 03/15/23 1025 09/07/23 0807 11/24/23 1317  AST 16 17 15 17   ALT 15 16 14 14   ALKPHOS 58 59  --  63  BILITOT 0.5 0.6 0.5 0.4  PROT 6.4* 6.3* 6.0* 6.0  ALBUMIN 4.1 4.0  --  4.3   No results for input(s): LIPASE, AMYLASE in the last 8760 hours. No results for input(s): AMMONIA in the last 8760 hours. CBC: Recent Labs    02/15/23 1057 03/15/23 1025 09/07/23 0807 11/24/23 1317  WBC 4.3 7.7 6.2 7.3  NEUTROABS 1.7 5.5 4,179  --   HGB 12.6  12.5 13.3 13.2  HCT 36.7 36.5 39.4 39.7  MCV 98.1 97.6 98.5 101*  PLT 242 213 194 227   Lipid Panel: Recent Labs    09/07/23 0807  CHOL 131  HDL 52  LDLCALC 61  TRIG 96  CHOLHDL 2.5   Lab Results   Component Value Date   HGBA1C 5.8 (H) 12/11/2023    Procedures since last visit: No results found.  Assessment/Plan 1. Coronary artery disease involving native coronary artery of native heart without angina pectoris (Primary) Recent Stress test negative On Plavix  and Statin  2. Vitamin B 12 deficiency On Supplements  3. Primary insomnia Trazodone  Can take extra half pill when she gets up in middle of night  Watch for Drowziness   4. Senile osteoporosis Prolia    5. Mixed hyperlipidemia Statin  6. 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

## 2024-02-21 ENCOUNTER — Ambulatory Visit: Payer: Self-pay | Attending: Hematology and Oncology | Admitting: Rehabilitation

## 2024-02-21 DIAGNOSIS — Z171 Estrogen receptor negative status [ER-]: Secondary | ICD-10-CM | POA: Insufficient documentation

## 2024-02-21 DIAGNOSIS — Z483 Aftercare following surgery for neoplasm: Secondary | ICD-10-CM | POA: Insufficient documentation

## 2024-02-21 DIAGNOSIS — C50412 Malignant neoplasm of upper-outer quadrant of left female breast: Secondary | ICD-10-CM | POA: Insufficient documentation

## 2024-02-21 NOTE — Therapy (Signed)
 OUTPATIENT PHYSICAL THERAPY SOZO SCREENING NOTE   Patient Name: Jessica [name] MRN: [id number] DOB:January 27, 1941, 83 y.o., female Today's Date: 02/21/2024  PCP: Charlanne Fredia CROME, MD REFERRING PROVIDER: Odean Potts, MD   PT End of Session - 02/21/24 1310     Visit Number 11   screen   PT Start Time 1254    PT Stop Time 1309    PT Time Calculation (min) 15 min    Activity Tolerance Patient tolerated treatment well    Behavior During Therapy [facility] for tasks assessed/performed           Past Medical History:  Diagnosis Date   Anemia 12/2015   Breast cancer (HCC)    Bronchitis, chronic (HCC)    in her 70s   Cancer (HCC)    breast   Cataract    Chronic kidney disease    in 20s had nephritis   Closed fracture of right distal humerus    right   Coronary artery disease    Elevated Lp(a)    Hand, foot and mouth disease (HFMD)    Hearing loss 05/03/2016   High cholesterol    Hypothyroidism 2008   Lymphedema of left arm    Myocardial infarction (HCC)    Neuropathy    toes   Osteopenia    Pericarditis 01/1999   Pneumothorax 01/1999   Thyroid  disorder    Vitamin B 12 deficiency    Past Surgical History:  Procedure Laterality Date   birthmark      removed,left leg   BREAST BIOPSY Left 09/13/2022   US  LT BREAST BX W LOC DEV 1ST LESION IMG BX SPEC US  GUIDE 09/13/2022 [facility] MAMMOGRAPHY   CARDIAC CATHETERIZATION     CATARACT EXTRACTION  2017   CORONARY STENT INTERVENTION N/A 09/01/2020   Procedure: CORONARY STENT INTERVENTION;  Surgeon: Claudene Victory ORN, MD;  [facility];  Service: Cardiovascular;  Laterality: N/A;   LEFT HEART CATH AND CORONARY ANGIOGRAPHY N/A 09/01/2020   Procedure: LEFT HEART CATH AND CORONARY ANGIOGRAPHY;  Surgeon: Claudene Victory ORN, MD;  [facility];  Service: Cardiovascular;  Laterality: N/A;   ORIF HUMERUS FRACTURE Right 04/10/2019   Procedure: RIGHT OPEN REDUCTION INTERNAL FIXATION (ORIF) DISTAL HUMERUS FRACTURE WITH  EXTENSION;  Surgeon: Cristy Bonner DASEN, MD;  Location: [facility];  Service: Orthopedics;  Laterality: Right;   PORTACATH PLACEMENT N/A 11/15/2022   Procedure: PORT PLACEMENT WITH ULTRASOUND GUIDANCE;  Surgeon: Vanderbilt Ned, MD;  [facility];  Service: General;  Laterality: N/A;   SIMPLE MASTECTOMY WITH AXILLARY SENTINEL NODE BIOPSY Bilateral 10/18/2022   Procedure: BILATERAL SIMPLE MASTECTOMY;  Surgeon: Vanderbilt Ned, MD;  Location: [facility];  Service: General;  Laterality: Bilateral;  PEC BLOCK   TONSILLECTOMY AND ADENOIDECTOMY  1952?   UMBILICAL HERNIA REPAIR N/A 09/20/2023   Procedure: REPAIR, HERNIA, UMBILICAL, ADULT;  Surgeon: Lyndel Deward PARAS, MD;  Location: [facility];  Service: General;  Laterality: N/A;   Patient Active Problem List   Diagnosis Date Noted   Abnormal CT scan, chest 01/19/2024   Lymphedema 01/19/2024   Renal cyst, acquired, right 01/19/2024   Blurry vision, bilateral 08/30/2023   Paresthesia 08/30/2023   Malignant neoplasm of upper-outer quadrant of left breast in female, estrogen receptor negative (HCC) 09/21/2022   CAD (coronary artery disease) 04/08/2022   Swelling of left foot 04/07/2022   Left hand fracture, sequela 04/07/2022   Depression with anxiety 04/07/2022   NSTEMI (non-ST elevated myocardial  infarction) (HCC) 09/01/2020   Closed fracture of right distal humerus 04/10/2019   Dyspnea 05/09/2018   Vitamin B 12 deficiency 05/09/2018   Cough 03/06/2018   Senile osteoporosis 11/20/2017   Iron deficiency 04/26/2017   Lichen sclerosus 04/26/2017   External hemorrhoid 04/26/2017   Weakness of left leg 06/07/2016   Balance disorder 05/24/2016   Memory deficit 05/24/2016   Anemia 05/19/2016   Hearing loss 05/03/2016   Hypothyroidism 07/24/2013   Bradycardia on ECG 07/24/2013   Numbness 08/07/2012   Pure hypercholesterolemia 12/29/2006    REFERRING DIAG: left breast cancer at risk for lymphedema  THERAPY DIAG:  Aftercare following surgery for neoplasm  Malignant neoplasm of upper-outer quadrant of left breast in female, estrogen receptor negative (HCC)  PERTINENT HISTORY: Patient was diagnosed on 09/13/2022 with 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 She underwent a bilateral mastectomy and left sentinel node biopsy (7 negative nodes) on 10/18/2022. It is triple negative with a Ki67 of 5%. She has some cardiac history and had an elbow fracture in 2022 resulting in surgery where pins were placed.   PRECAUTIONS: left UE Lymphedema risk, None  SUBJECTIVE: Pt feels like the hand is swelling with just the sleeve.   PAIN:  Are you having pain? No - but still having arm pain with piano.   SOZO SCREENING: Patient was assessed today using the SOZO machine to determine the lymphedema index score. This was compared to her baseline score. It was determined that her score is back in yellow  The patient is educated to wear her compression sleeve daily as well as her gauntlet now. Also discussed gym activities as pt is working with a psychologist, educational.  Will limit weights to under 10# and more around 5 or less with no more than 2 sets of 10.     L-DEX FLOWSHEETS - 02/21/24 1300       L-DEX LYMPHEDEMA SCREENING   Measurement Type Unilateral    L-DEX MEASUREMENT EXTREMITY Upper Extremity    POSITION  Standing    DOMINANT SIDE Right    At Risk Side Left    BASELINE SCORE (UNILATERAL) -2.2    L-DEX SCORE (UNILATERAL) 4.6    VALUE CHANGE (UNILAT) 6.8    Comment pt will continue wearing compression            Saddie Raw, PT 02/21/2024, 1:12 PM

## 2024-02-22 ENCOUNTER — Other Ambulatory Visit: Payer: Self-pay | Admitting: Internal Medicine

## 2024-02-22 ENCOUNTER — Other Ambulatory Visit: Payer: Self-pay | Admitting: Cardiovascular Disease

## 2024-02-26 ENCOUNTER — Ambulatory Visit

## 2024-03-11 ENCOUNTER — Encounter: Admitting: *Deleted

## 2024-03-11 VITALS — BP 149/79 | HR 54 | Temp 97.2°F | Resp 18 | Wt 158.0 lb

## 2024-03-11 DIAGNOSIS — Z006 Encounter for examination for normal comparison and control in clinical research program: Secondary | ICD-10-CM

## 2024-03-11 MED ORDER — STUDY - OCEAN(A) - OLPASIRAN (AMG 890) 142 MG/ML OR PLACEBO SQ INJECTION (PI-HILTY)
142.0000 mg | PREFILLED_SYRINGE | Freq: Once | SUBCUTANEOUS | Status: AC
  Administered 2024-03-11: 142 mg via SUBCUTANEOUS
  Filled 2024-03-11: qty 1

## 2024-03-11 NOTE — Research (Signed)
 Jessica [name]  Week 144  Vitals: [x]  Med reviews: [x]  Labs: (week 48): [x]  EKG: [x]  MD PE: [x]   TS  Experience any AE/SAE/Hospitalizations []  Yes [x]  No  If yes please explain:   Discussed with patient about the importance of not letting any one draw cholesterol or lipids. As to we are blinded to results. Reassured patient if we needed to be contacted the study team would reach out to our unblinded person.   Patient has experienced some weakness, but had stress test completed it was normal. CT scan showed [name] nodule in the right lung base, some diaphragmatic hernias and [name] right renal cyst primary care is aware to determine work up if needed. She is also going to see [name] trainer to help her with stretching and moving more.  ~MD to see and fasting labs next visit 156~  Non-Fatal Potential Endpoint Assessment Yes  No   Has the subject experienced/undergone any of the following since the last visit/contact?   []   [x]    Any Coronary Artery Revascularization/Cerebrovascular Revascularization/ Peripheral Artery Revascularization/Amputation Procedure   []   [x]    Myocardial Infarction []   [x]    Stroke   []   [x]    Provide the date for the non-fatal Potential Endpoints status as of today visit date    []   [x]     IP admin please see MAR (please add Box # to comment section on MAR) [x]   Current Medications[1]     [1]  Current Outpatient Medications:    acetaminophen  (TYLENOL ) 500 MG tablet, Take 500-1,000 mg by mouth every 6 (six) hours as needed (pain.)., Disp: , Rfl:    ascorbic acid  (VITAMIN C) 500 MG tablet, Take 500 mg by mouth daily as needed (immune support (sore throat))., Disp: , Rfl:    Calcium  Carbonate-Vitamin D  (CALCIUM  600+D PO), Take 1 tablet by mouth in the morning., Disp: , Rfl:    carboxymethylcellulose (REFRESH PLUS) 0.5 % SOLN, Place 1 drop into both eyes 3 (three) times daily as needed (dry/irritated eyes.)., Disp: , Rfl:    cholecalciferol  (VITAMIN D3) 25 MCG (1000 UNIT)  tablet, Take 1,000 Units by mouth in the morning., Disp: , Rfl:    clobetasol ointment (TEMOVATE) 0.05 %, Apply 1 Application topically as directed. Every 4 days, Disp: , Rfl:    clopidogrel  (PLAVIX ) 75 MG tablet, TAKE 1 TABLET BY MOUTH DAILY, Disp: 90 tablet, Rfl: 3   cyanocobalamin  (VITAMIN B12) 1000 MCG tablet, Take 1 tablet (1,000 mcg total) by mouth daily., Disp: , Rfl:    denosumab  (PROLIA ) 60 MG/ML SOSY injection, Inject 60 mg into the skin every 6 (six) months. Administered at [facility] q83m, Disp: , Rfl:    ferrous sulfate 325 (65 FE) MG tablet, Take 325 mg by mouth every evening., Disp: , Rfl:    gabapentin  (NEURONTIN ) 100 MG capsule, TAKE 1 CAPSULE BY MOUTH DAILY, Disp: 90 capsule, Rfl: 1   levothyroxine  (SYNTHROID ) 112 MCG tablet, TAKE 1 TABLET BY MOUTH DAILY, Disp: 90 tablet, Rfl: 3   loratadine  (CLARITIN ) 10 MG tablet, Take 10 mg by mouth as needed for allergies., Disp: , Rfl:    melatonin 5 MG TABS, Take 5 mg by mouth at bedtime., Disp: , Rfl:    Multiple Vitamin (MULTIVITAMIN WITH MINERALS) TABS tablet, Take 1 tablet by mouth every evening., Disp: , Rfl:    nitroGLYCERIN  (NITROSTAT ) 0.4 MG SL tablet, DISSOLVE 1 TAB UNDER TONGUE FOR CHEST PAIN - IF PAIN REMAINS AFTER 5 MIN, CALL 911 AND  REPEAT DOSE. MAX 3 TABS IN 15 MINUTES, Disp: 25 tablet, Rfl: 6   rosuvastatin  (CRESTOR ) 20 MG tablet, TAKE 1 TABLET BY MOUTH DAILY, Disp: 90 tablet, Rfl: 3   Study - Jessica([name]) - olpasiran (AMG 890) 142 mg/mL or placebo SQ injection (PI-Hilty), Inject 142 mg into the skin once. For investigational use only - Inject 1 mL (1 prefilled syringe) subcutaneously into appropriate injection site per protocol. (Approved injection site(s): upper arm, upper thigh, abdomen). Please contact [facility] if any questions., Disp: , Rfl:    traZODone  (DESYREL ) 50 MG tablet, Take 0.5 tablets (25 mg total) by mouth at bedtime as needed for sleep. Can take 25 -50 Mg t night as needed, Disp: 90 tablet,  Rfl: 3  Current Facility-Administered Medications:    denosumab  (PROLIA ) injection 60 mg, 60 mg, Subcutaneous, Q6 months, Charlanne, Anjali L, MD   Study - Jessica([name]) - olpasiran (AMG 890) 142 mg/mL or placebo SQ injection (PI-Hilty), 142 mg, Subcutaneous, Once,

## 2024-03-13 ENCOUNTER — Encounter: Payer: Self-pay | Admitting: Cardiovascular Disease

## 2024-03-15 ENCOUNTER — Encounter: Payer: Self-pay | Admitting: Rehabilitation

## 2024-03-15 NOTE — Research (Addendum)
 Are there any labs that are clinically significant?  Yes []  OR No[x]   Please FORWARD back to me with any changes or follow up!   Jessica KYM Maxcy, MD, [facility], FNLA, FACP  [place]  [facility]  Medical Director of the [facility]  [facility] Diplomate of the American Board of Clinical Lipidology Attending Cardiologist  Direct Dial: [phone]  Fax: [phone]  Website:  [email or web address]   ACCESSION NO. [id number]                                             Page 1 of 1                                                        INVESTIGATOR: ([id number])                          PROTOCOL   [id number]                     Jessica [name] M.D.                             INVESTIGATOR NO.: [id number]                     c/o [name]                              SUBJECT ID: [id number]                     [facility]                 SUBJECT INITIALS NOT COLLECTED:                     [address] [place] [address]                 VISIT: [id number]                     [place], [place]  [id number]                   SPONSOR REPORT TO:                 COLLECTION TIME:08:57 DATE:11-Mar-2024                     [name]                      DATE RECEIVED IN LABORATORY: 12-Mar-2024                     c/o Sponsor(or Addtl) [employer].Study DATE REPORTED BY LABORATORY: 12-Mar-2024                     [facility]                          SEX:  F  BIRTHDATE:  04-Sep-1940    AGE: 83y                     [address]                  SELMA NO.: [phone]                   [place], [place]  [phone]                                                                                        Ref. Ranges               Clinical    Comments                                                                          Significance                                                                            Yes*  No                    SM [id number]  ANTIBODY COLL D/T                      CDate PreD     11-Mar-2024                                               CTime PreD     08:57                                                   SM [id number] LPA COLLECTION D/T                      Coll Date      11-Mar-2024                                               Coll Time      08:57  FASTING GLUCOSE                      Glucose        100          70-100 mg/dL                               IS SUBJECT FASTING?                      Fasting?       Yes                    ANION GAP                      Anion Gap      17           7-18 mEq/L                     EGFR                      CKDEPI eGF     90           mL/min/1.19m2                                                            No Ref Rng                    HBA1C                      Hgb A1c        5.8          <6.5 %                     CHEMISTRY PANEL                      Total Bili     0.4          0.2-1.2 mg/dL                                D-BilGen2      0.14         0.00-0.36 mg/dL                              Ind Bili       0.3          0.0-1.2 mg/dL                                Alk Phos       56           35-104 U/L                                   ALT (SGPT)  22           4-43 U/L                                    AST (SGOT)     21           8-40 U/L                                    Urea Nitr      17           4-34 mg/dL                                   Creatinine     0.60         0.35-1.45 mg/dL                              Calcium         9.2          8.3-10.6 mg/dL                              Total Prot     6.1          6.0-8.0 g/dL                                 Alb BCG        4.3          3.0-4.6 g/dL                                 CK             37           26-192 U/L                                   Sodium         139          135-145 mEq/L                                Potassium      4.5          3.5-5.2  mEq/L                                Bicarb         23.9         19.3-29.3 mEq/L                              Chloride       103          94-112 mEq/L  ADJUSTED CALCIUM                       Adj Calc       9.2          8.3-10.6 mg/dL                      HEMATOLOGY&DIFFERENTIAL PANEL                      HGB            13.4         11.5-15.8 g/dL                              HCT            40           34-48 %                                      RBC            4.0          3.9-5.5 x106/uL                              MCH            33           26-34 pg                                    MCHC           34           31-38 g/dL                                   RDW            13.2         12.0-15.0 %                                 RBC Morph      No Review Required                                       MCV            98           80-100 fL                                    WBC            6.06         3.80-10.70 x103/uL                           Neutrophil     3.88         1.96-7.23 x103/uL  Lymphocyte     1.49         0.80-3.00 x103/uL                           Monocytes      0.43         0.12-0.92 x103/uL                           Eosinophil     0.18         0.00-0.57 x103/uL                           Basophils      0.08         0.00-0.20 x103/uL                           Neutrophil     64.0         40.5-75.0 %                                 Lymphocyte     24.7         15.4-48.5%                                   Monocytes      7.0          2.6-10.1 %                                   Eosinophil     3.0          0.0-6.8 %                                    Basophils      1.3          0.0-2.0 %                                    Platelets      240          130-394 x103/uL                            ANC                      ANC            3.88         1.96-7.23 x103/uL                   COAGULATION GROUP                      APTT            24.7         21.9-29.4 sec  PT             9.7          9.7-12.3 sec                                 INR            0.9          Patient not taking                                                       oral anticoagulant:                                                      0.8 - 1.2                                                                Patient taking                                                          oral anticoagulant:                                                      2.0 - 3.0                                  ALT & INR                      ALT & INR      To follow                                              AST & INR                      AST & INR      To follow                       ALT > 3 X ULN                      ALT>3XULN      Criteria not met  ALT > 5 X ULN                      ALT>5XULN      Criteria not met                                        ALT > 8 X ULN                      ALT>8XULN      Criteria not met                                        ALT & TBIL                      ALT & TBIL     Criteria not met                                        AST > 3 X ULN                      AST>3XULN      Criteria not met                                        AST > 5 X ULN                      AST>5XULN      Criteria not met                                        AST > 8 X ULN                      AST>8XULN      Criteria not met                                        AST & TBIL                      AST & TBIL     Criteria not met                     ALT & INR                      ALT & INR      Criteria not met                                        AST & INR                      AST & INR  Criteria not met                     DECREASE >/= EGFR 50%                      eGFR > 50%     Criteria not met

## 2024-03-19 LAB — CBC WITH DIFFERENTIAL/PLATELET
Absolute Lymphocytes: 1527 {cells}/uL (ref 850–3900)
Absolute Monocytes: 568 {cells}/uL (ref 200–950)
Basophils Absolute: 43 {cells}/uL (ref 0–200)
Basophils Relative: 0.6 %
Eosinophils Absolute: 227 {cells}/uL (ref 15–500)
Eosinophils Relative: 3.2 %
HCT: 39.2 % (ref 35.9–46.0)
Hemoglobin: 13 g/dL (ref 11.7–15.5)
MCH: 33.1 pg — ABNORMAL HIGH (ref 27.0–33.0)
MCHC: 33.2 g/dL (ref 31.6–35.4)
MCV: 99.7 fL (ref 81.4–101.7)
MPV: 10.5 fL (ref 7.5–12.5)
Monocytes Relative: 8 %
Neutro Abs: 4736 {cells}/uL (ref 1500–7800)
Neutrophils Relative %: 66.7 %
Platelets: 213 Thousand/uL (ref 140–400)
RBC: 3.93 Million/uL (ref 3.80–5.10)
RDW: 12.9 % (ref 11.0–15.0)
Total Lymphocyte: 21.5 %
WBC: 7.1 Thousand/uL (ref 3.8–10.8)

## 2024-03-19 LAB — COMPLETE METABOLIC PANEL WITHOUT GFR
AG Ratio: 2.1 (calc) (ref 1.0–2.5)
ALT: 13 U/L (ref 6–29)
AST: 13 U/L (ref 10–35)
Albumin: 4.1 g/dL (ref 3.6–5.1)
Alkaline phosphatase (APISO): 56 U/L (ref 37–153)
BUN/Creatinine Ratio: 33 (calc) — ABNORMAL HIGH (ref 6–22)
BUN: 15 mg/dL (ref 7–25)
CO2: 28 mmol/L (ref 20–32)
Calcium: 8.7 mg/dL (ref 8.6–10.4)
Chloride: 105 mmol/L (ref 98–110)
Creat: 0.46 mg/dL — ABNORMAL LOW (ref 0.60–0.95)
Globulin: 2 g/dL (ref 1.9–3.7)
Glucose, Bld: 101 mg/dL — ABNORMAL HIGH (ref 65–99)
Potassium: 3.9 mmol/L (ref 3.5–5.3)
Sodium: 139 mmol/L (ref 135–146)
Total Bilirubin: 0.5 mg/dL (ref 0.2–1.2)
Total Protein: 6.1 g/dL (ref 6.1–8.1)

## 2024-03-19 LAB — LIPID PANEL
Cholesterol: 140 mg/dL
HDL: 58 mg/dL
LDL Cholesterol (Calc): 61 mg/dL
Non-HDL Cholesterol (Calc): 82 mg/dL
Total CHOL/HDL Ratio: 2.4 (calc)
Triglycerides: 131 mg/dL

## 2024-03-19 LAB — HEMOGLOBIN A1C
Hgb A1c MFr Bld: 5.8 % — ABNORMAL HIGH
Mean Plasma Glucose: 120 mg/dL
eAG (mmol/L): 6.6 mmol/L

## 2024-03-19 LAB — VITAMIN B12: Vitamin B-12: 399 pg/mL (ref 200–1100)

## 2024-03-19 LAB — TSH: TSH: 5.9 m[IU]/L — ABNORMAL HIGH (ref 0.40–4.50)

## 2024-03-19 NOTE — Progress Notes (Signed)
 Jessica [name] is in for [facility] follow up.  She continues in the [name] trial.  She continues to follow up with Dr. Gudena for her breast CA diagnosis.  She was last seen in cards by Jon Hails in June. She had transient blurred vision, and eval by neuro.  Carotids were unrevealing (tortuous high velocity), CT cardiac pet normal with EF 83%.  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  Had Chest CT with findings as noted.        03/11/2024    8:36 AM  BP 149/79  BP Location Right Arm  Patient Position Sitting  Cuff Size Normal  Pulse Rate 54  Temp 97.2 F (36.2 C)  Weight 158 lb (71.7 kg)  Resp 18  SpO2 100 %   Alert, oriented, cognitive, and engaged No JVD No bruits Lungs clear Cor regular without significant murmur Abd  soft Ext  no edema     ECG  Sinus bradycardia with sinus arrhythmia.  Nonspecific T abnormality.  No significant change from tracing of 03/11/2024.   Patient remains in [place] a for known CAD and elevated Lpa.  Continue IP at present. Continue follow up in CAD clinic.    [name], MD, FACC

## 2024-03-21 ENCOUNTER — Ambulatory Visit: Payer: Self-pay | Admitting: Internal Medicine

## 2024-03-28 ENCOUNTER — Ambulatory Visit: Attending: Hematology and Oncology | Admitting: Rehabilitation

## 2024-03-28 DIAGNOSIS — C50412 Malignant neoplasm of upper-outer quadrant of left female breast: Secondary | ICD-10-CM | POA: Insufficient documentation

## 2024-03-28 DIAGNOSIS — Z171 Estrogen receptor negative status [ER-]: Secondary | ICD-10-CM | POA: Insufficient documentation

## 2024-03-28 DIAGNOSIS — Z483 Aftercare following surgery for neoplasm: Secondary | ICD-10-CM | POA: Insufficient documentation

## 2024-03-28 NOTE — Therapy (Signed)
 " OUTPATIENT PHYSICAL THERAPY SOZO SCREENING NOTE   Patient Name: Jessica [name] MRN: [id number] DOB:03-29-1940, 84 y.o., female Today's Date: 03/28/2024  PCP: Charlanne Fredia CROME, MD REFERRING PROVIDER: Odean Potts, MD   PT End of Session - 03/28/24 1014     Visit Number 11   screen   PT Start Time 1005    PT Stop Time 1015    PT Time Calculation (min) 10 min    Activity Tolerance Patient tolerated treatment well    Behavior During Therapy [facility] for tasks assessed/performed           Past Medical History:  Diagnosis Date   Anemia 12/2015   Breast cancer (HCC)    Bronchitis, chronic (HCC)    in her 38s   Cancer (HCC)    breast   Cataract    Chronic kidney disease    in 20s had nephritis   Closed fracture of right distal humerus    right   Coronary artery disease    Elevated Lp(a)    Hand, foot and mouth disease (HFMD)    Hearing loss 05/03/2016   High cholesterol    Hypothyroidism 2008   Lymphedema of left arm    Myocardial infarction (HCC)    Neuropathy    toes   Osteopenia    Pericarditis 01/1999   Pneumothorax 01/1999   Thyroid  disorder    Vitamin B 12 deficiency    Past Surgical History:  Procedure Laterality Date   birthmark      removed,left leg   BREAST BIOPSY Left 09/13/2022   US  LT BREAST BX W LOC DEV 1ST LESION IMG BX SPEC US  GUIDE 09/13/2022 [facility] MAMMOGRAPHY   CARDIAC CATHETERIZATION     CATARACT EXTRACTION  2017   CORONARY STENT INTERVENTION N/A 09/01/2020   Procedure: CORONARY STENT INTERVENTION;  Surgeon: Claudene Victory ORN, MD;  Location: [facility];  Service: Cardiovascular;  Laterality: N/A;   LEFT HEART CATH AND CORONARY ANGIOGRAPHY N/A 09/01/2020   Procedure: LEFT HEART CATH AND CORONARY ANGIOGRAPHY;  Surgeon: Claudene Victory ORN, MD;  Location: [facility];  Service: Cardiovascular;  Laterality: N/A;   ORIF HUMERUS FRACTURE Right 04/10/2019   Procedure: RIGHT OPEN REDUCTION INTERNAL FIXATION (ORIF) DISTAL HUMERUS FRACTURE WITH  EXTENSION;  Surgeon: Cristy Bonner DASEN, MD;  Location: [facility];  Service: Orthopedics;  Laterality: Right;   PORTACATH PLACEMENT N/A 11/15/2022   Procedure: PORT PLACEMENT WITH ULTRASOUND GUIDANCE;  Surgeon: Vanderbilt Ned, MD;  Location: [facility] OR;  Service: General;  Laterality: N/A;   SIMPLE MASTECTOMY WITH AXILLARY SENTINEL NODE BIOPSY Bilateral 10/18/2022   Procedure: BILATERAL SIMPLE MASTECTOMY;  Surgeon: Vanderbilt Ned, MD;  Location: [facility];  Service: General;  Laterality: Bilateral;  PEC BLOCK   TONSILLECTOMY AND ADENOIDECTOMY  1952?   UMBILICAL HERNIA REPAIR N/A 09/20/2023   Procedure: REPAIR, HERNIA, UMBILICAL, ADULT;  Surgeon: Lyndel Deward PARAS, MD;  Location: [facility];  Service: General;  Laterality: N/A;   Patient Active Problem List   Diagnosis Date Noted   Abnormal CT scan, chest 01/19/2024   Lymphedema 01/19/2024   Renal cyst, acquired, right 01/19/2024   Blurry vision, bilateral 08/30/2023   Paresthesia 08/30/2023   Malignant neoplasm of upper-outer quadrant of left breast in female, estrogen receptor negative (HCC) 09/21/2022   CAD (coronary artery disease) 04/08/2022   Swelling of left foot 04/07/2022   Left hand fracture, sequela 04/07/2022   Depression with anxiety 04/07/2022   NSTEMI (non-ST elevated  myocardial infarction) (HCC) 09/01/2020   Closed fracture of right distal humerus 04/10/2019   Dyspnea 05/09/2018   Vitamin B 12 deficiency 05/09/2018   Cough 03/06/2018   Senile osteoporosis 11/20/2017   Iron deficiency 04/26/2017   Lichen sclerosus 04/26/2017   External hemorrhoid 04/26/2017   Weakness of left leg 06/07/2016   Balance disorder 05/24/2016   Memory deficit 05/24/2016   Anemia 05/19/2016   Hearing loss 05/03/2016   Hypothyroidism 07/24/2013   Bradycardia on ECG 07/24/2013   Numbness 08/07/2012   Pure hypercholesterolemia 12/29/2006    REFERRING DIAG: left breast cancer at risk for lymphedema  THERAPY DIAG:  Aftercare following surgery for neoplasm  Malignant neoplasm of upper-outer quadrant of left breast in female, estrogen receptor negative (HCC)  PERTINENT HISTORY: Patient was diagnosed on 09/13/2022 with 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 She underwent a bilateral mastectomy and left sentinel node biopsy (7 negative nodes) on 10/18/2022. It is triple negative with a Ki67 of 5%. She has some cardiac history and had an elbow fracture in 2022 resulting in surgery where pins were placed.   PRECAUTIONS: left UE Lymphedema risk, None  SUBJECTIVE: Pt feels like the hand is swelling with just the sleeve.   PAIN:  Are you having pain? No - but still having arm pain with piano.   SOZO SCREENING: Patient was assessed today using the SOZO machine to determine the lymphedema index score. This was compared to her baseline score. It was determined that her score is back in yellow  The patient is educated to wear her compression sleeve daily as well as her gauntlet now. Also discussed gym activities as pt is working with a psychologist, educational.  Will stop weight lifting for now.   L-DEX FLOWSHEETS - 03/28/24 1000       L-DEX LYMPHEDEMA SCREENING   Measurement Type Unilateral    L-DEX MEASUREMENT EXTREMITY Upper Extremity    POSITION  Standing    DOMINANT SIDE Right    At Risk Side Left    BASELINE SCORE (UNILATERAL) -2.2    L-DEX SCORE (UNILATERAL) 22.7    VALUE CHANGE (UNILAT) 24.9    Comment will start PT            Saddie Raw, PT 03/28/24, 10:15 AM      "

## 2024-04-03 ENCOUNTER — Ambulatory Visit: Admitting: Rehabilitation

## 2024-04-09 ENCOUNTER — Ambulatory Visit: Admitting: Rehabilitation

## 2024-04-09 ENCOUNTER — Encounter: Payer: Self-pay | Admitting: Rehabilitation

## 2024-04-09 ENCOUNTER — Other Ambulatory Visit: Payer: Self-pay | Admitting: *Deleted

## 2024-04-09 ENCOUNTER — Other Ambulatory Visit: Payer: Self-pay

## 2024-04-09 DIAGNOSIS — I972 Postmastectomy lymphedema syndrome: Secondary | ICD-10-CM

## 2024-04-09 DIAGNOSIS — Z171 Estrogen receptor negative status [ER-]: Secondary | ICD-10-CM

## 2024-04-09 DIAGNOSIS — Z483 Aftercare following surgery for neoplasm: Secondary | ICD-10-CM

## 2024-04-09 LAB — CBC WITH DIFFERENTIAL/PLATELET
Absolute Lymphocytes: 1538 {cells}/uL (ref 850–3900)
Absolute Monocytes: 455 {cells}/uL (ref 200–950)
Basophils Absolute: 40 {cells}/uL (ref 0–200)
Basophils Relative: 0.6 %
Eosinophils Absolute: 224 {cells}/uL (ref 15–500)
Eosinophils Relative: 3.4 %
HCT: 38.6 % (ref 35.9–46.0)
Hemoglobin: 13 g/dL (ref 11.7–15.5)
MCH: 33.6 pg — ABNORMAL HIGH (ref 27.0–33.0)
MCHC: 33.7 g/dL (ref 31.6–35.4)
MCV: 99.7 fL (ref 81.4–101.7)
MPV: 10.7 fL (ref 7.5–12.5)
Monocytes Relative: 6.9 %
Neutro Abs: 4343 {cells}/uL (ref 1500–7800)
Neutrophils Relative %: 65.8 %
Platelets: 216 10*3/uL (ref 140–400)
RBC: 3.87 Million/uL (ref 3.80–5.10)
RDW: 12.7 % (ref 11.0–15.0)
Total Lymphocyte: 23.3 %
WBC: 6.6 10*3/uL (ref 3.8–10.8)

## 2024-04-09 LAB — TSH: TSH: 2.62 m[IU]/L (ref 0.40–4.50)

## 2024-04-09 LAB — LIPID PANEL
Cholesterol: 134 mg/dL
HDL: 52 mg/dL
LDL Cholesterol (Calc): 61 mg/dL
Non-HDL Cholesterol (Calc): 82 mg/dL
Total CHOL/HDL Ratio: 2.6 (calc)
Triglycerides: 124 mg/dL

## 2024-04-09 LAB — COMPLETE METABOLIC PANEL WITHOUT GFR
AG Ratio: 2.1 (calc) (ref 1.0–2.5)
ALT: 14 U/L (ref 6–29)
AST: 15 U/L (ref 10–35)
Albumin: 4 g/dL (ref 3.6–5.1)
Alkaline phosphatase (APISO): 49 U/L (ref 37–153)
BUN/Creatinine Ratio: 29 (calc) — ABNORMAL HIGH (ref 6–22)
BUN: 17 mg/dL (ref 7–25)
CO2: 27 mmol/L (ref 20–32)
Calcium: 8.6 mg/dL (ref 8.6–10.4)
Chloride: 105 mmol/L (ref 98–110)
Creat: 0.58 mg/dL — ABNORMAL LOW (ref 0.60–0.95)
Globulin: 1.9 g/dL (ref 1.9–3.7)
Glucose, Bld: 95 mg/dL (ref 65–99)
Potassium: 4.4 mmol/L (ref 3.5–5.3)
Sodium: 139 mmol/L (ref 135–146)
Total Bilirubin: 0.5 mg/dL (ref 0.2–1.2)
Total Protein: 5.9 g/dL — ABNORMAL LOW (ref 6.1–8.1)

## 2024-04-09 LAB — VITAMIN B12: Vitamin B-12: 350 pg/mL (ref 200–1100)

## 2024-04-09 NOTE — Progress Notes (Signed)
 Per MD request referral for lymphedema clinic placed.

## 2024-04-10 ENCOUNTER — Non-Acute Institutional Stay: Payer: Self-pay | Admitting: Internal Medicine

## 2024-04-10 ENCOUNTER — Encounter: Payer: Self-pay | Admitting: Internal Medicine

## 2024-04-10 VITALS — BP 128/78 | HR 63 | Temp 98.0°F | Resp 18 | Ht 63.0 in | Wt 156.0 lb

## 2024-04-10 DIAGNOSIS — E538 Deficiency of other specified B group vitamins: Secondary | ICD-10-CM | POA: Diagnosis not present

## 2024-04-10 DIAGNOSIS — M7071 Other bursitis of hip, right hip: Secondary | ICD-10-CM | POA: Diagnosis not present

## 2024-04-10 DIAGNOSIS — I2510 Atherosclerotic heart disease of native coronary artery without angina pectoris: Secondary | ICD-10-CM

## 2024-04-10 DIAGNOSIS — F5101 Primary insomnia: Secondary | ICD-10-CM

## 2024-04-10 MED ORDER — MELOXICAM 15 MG PO TABS: 15.0000 mg | ORAL_TABLET | Freq: Every day | ORAL | 0 refills | Status: AC

## 2024-04-10 MED ORDER — TRAZODONE HCL 50 MG PO TABS: 50.0000 mg | ORAL_TABLET | Freq: Every evening | ORAL | Status: AC | PRN

## 2024-04-10 NOTE — Progress Notes (Signed)
 "  Location:  [facility] Service:  Clinic (12)  Provider:   Code Status:  Goals of Care:     04/09/2024    2:01 PM  Advanced Directives  Does Patient Have a Medical Advance Directive? Yes  Type of Estate Agent of [place];Living will     Chief Complaint  Patient presents with   Medical Management of Chronic Issues    Four Months with labs    HPI: Patient is a 84 y.o. female seen today for medical management of chronic diseases.    L[place] [facility] [place]  Discussed the use of AI scribe software for clinical note transcription with the patient, who gave verbal consent to proceed.  History of Present Illness   Jessica [name] is an 84 year old female who presents with hip pain and instability. Right Hip Pain She has had significant right hip pain and weakness for 1-2 months, located in the right hip with occasional radiation to the groin. The pain is worst in the morning and improves with movement. It is relieved with Tylenol , which she takes 1-2 tablets daily as needed.  The hip pain and weakness cause instability, especially at night, and she now uses a cane and sometimes a walker. She denies recent falls or hip trauma. She works with a psychologist, educational who stretches her hip and notes mild pain with stretching. Left Arm Lymphedema s/p Mastectomy She has lymphedema of the arm that persists despite elevation and is in physical therapy. Therapy is wrapping it twice a week  Insomnia Her sleep is managed with trazodone  50 mg, gabapentin , and melatonin, and she is considering stopping melatonin.  Neuropathy Seen Dr Onita Worsened with Chemo She takes B12 supplements, which she feels help her neuropathy and balance, with improved B12 levels on recent labs. Neuropathy is stable with occasional brief stabs of pain. She denies recent falls.      H/o CAD and NSTEMI 2022 She takes Plavix  and a statin for cholesterol.  Low risk Stress test 12/2023   Past  Medical History:  Diagnosis Date   Anemia 12/2015   Breast cancer (HCC)    Bronchitis, chronic (HCC)    in her 32s   Cancer (HCC)    breast   Cataract    Chronic kidney disease    in 20s had nephritis   Closed fracture of right distal humerus    right   Coronary artery disease    Elevated Lp(a)    Hand, foot and mouth disease (HFMD)    Hearing loss 05/03/2016   High cholesterol    Hypothyroidism 2008   Lymphedema of left arm    Myocardial infarction (HCC)    Neuropathy    toes   Osteopenia    Pericarditis 01/1999   Pneumothorax 01/1999   Thyroid  disorder    Vitamin B 12 deficiency     Past Surgical History:  Procedure Laterality Date   birthmark      removed,left leg   BREAST BIOPSY Left 09/13/2022   US  LT BREAST BX W LOC DEV 1ST LESION IMG BX SPEC US  GUIDE 09/13/2022 [facility] MAMMOGRAPHY   CARDIAC CATHETERIZATION     CATARACT EXTRACTION  2017   CORONARY STENT INTERVENTION N/A 09/01/2020   Procedure: CORONARY STENT INTERVENTION;  Surgeon: Claudene Victory ORN, MD;  [facility]: [facility];  Service: Cardiovascular;  Laterality: N/A;   LEFT HEART CATH AND CORONARY ANGIOGRAPHY N/A 09/01/2020   Procedure: LEFT HEART  CATH AND CORONARY ANGIOGRAPHY;  Surgeon: Claudene Victory ORN, MD;  Location: [facility] INVASIVE CV LAB;  Service: Cardiovascular;  Laterality: N/A;   ORIF HUMERUS FRACTURE Right 04/10/2019   Procedure: RIGHT OPEN REDUCTION INTERNAL FIXATION (ORIF) DISTAL HUMERUS FRACTURE WITH EXTENSION;  Surgeon: Cristy Bonner DASEN, MD;  Location: [facility];  Service: Orthopedics;  Laterality: Right;   PORTACATH PLACEMENT N/A 11/15/2022   Procedure: PORT PLACEMENT WITH ULTRASOUND GUIDANCE;  Surgeon: Vanderbilt Ned, MD;  Location: [facility];  Service: General;  Laterality: N/A;   SIMPLE MASTECTOMY WITH AXILLARY SENTINEL NODE BIOPSY Bilateral 10/18/2022   Procedure: BILATERAL SIMPLE MASTECTOMY;  Surgeon: Vanderbilt Ned, MD;  Location: [facility];  Service: General;   Laterality: Bilateral;  PEC BLOCK   TONSILLECTOMY AND ADENOIDECTOMY  1952?   UMBILICAL HERNIA REPAIR N/A 09/20/2023   Procedure: REPAIR, HERNIA, UMBILICAL, ADULT;  Surgeon: Lyndel Deward PARAS, MD;  Location: [facility];  Service: General;  Laterality: N/A;    Allergies[1]  Outpatient Encounter Medications as of 04/10/2024  Medication Sig   acetaminophen  (TYLENOL ) 500 MG tablet Take 500-1,000 mg by mouth every 6 (six) hours as needed (pain.).   ascorbic acid  (VITAMIN C) 500 MG tablet Take 500 mg by mouth daily as needed (immune support (sore throat)).   Calcium  Carbonate-Vitamin D  (CALCIUM  600+D PO) Take 1 tablet by mouth in the morning.   carboxymethylcellulose (REFRESH PLUS) 0.5 % SOLN Place 1 drop into both eyes 3 (three) times daily as needed (dry/irritated eyes.).   cholecalciferol  (VITAMIN D3) 25 MCG (1000 UNIT) tablet Take 1,000 Units by mouth in the morning.   clobetasol ointment (TEMOVATE) 0.05 % Apply 1 Application topically as directed. Every 4 days   clopidogrel  (PLAVIX ) 75 MG tablet TAKE 1 TABLET BY MOUTH DAILY   cyanocobalamin  (VITAMIN B12) 1000 MCG tablet Take 1 tablet (1,000 mcg total) by mouth daily.   denosumab  (PROLIA ) 60 MG/ML SOSY injection Inject 60 mg into the skin every 6 (six) months. Administered at [facility] q10m   ferrous sulfate 325 (65 FE) MG tablet Take 325 mg by mouth every evening.   fluorometholone (FML) 0.1 % ophthalmic suspension Place 1 drop into the right eye 4 (four) times daily.   gabapentin  (NEURONTIN ) 100 MG capsule TAKE 1 CAPSULE BY MOUTH DAILY   levothyroxine  (SYNTHROID ) 112 MCG tablet TAKE 1 TABLET BY MOUTH DAILY   loratadine  (CLARITIN ) 10 MG tablet Take 10 mg by mouth as needed for allergies.   melatonin 5 MG TABS Take 5 mg by mouth at bedtime.   meloxicam  (MOBIC ) 15 MG tablet Take 1 tablet (15 mg total) by mouth daily.   Multiple Vitamin (MULTIVITAMIN WITH MINERALS) TABS tablet Take 1 tablet by mouth every evening.   nitroGLYCERIN   (NITROSTAT ) 0.4 MG SL tablet DISSOLVE 1 TAB UNDER TONGUE FOR CHEST PAIN - IF PAIN REMAINS AFTER 5 MIN, CALL 911 AND REPEAT DOSE. MAX 3 TABS IN 15 MINUTES   rosuvastatin  (CRESTOR ) 20 MG tablet TAKE 1 TABLET BY MOUTH DAILY   Study - [name] - olpasiran (AMG 890) 142 mg/mL or placebo SQ injection (PI-Hilty) Inject 142 mg into the skin once. For investigational use only - Inject 1 mL (1 prefilled syringe) subcutaneously into appropriate injection site per protocol. (Approved injection site(s): upper arm, upper thigh, abdomen). Please contact [facility] if any questions.   [DISCONTINUED] traZODone  (DESYREL ) 50 MG tablet Take 0.5 tablets (25 mg total) by mouth at bedtime as needed for sleep. Can take 25 -50 Mg t  night as needed   traZODone  (DESYREL ) 50 MG tablet Take 1 tablet (50 mg total) by mouth at bedtime as needed for sleep.   Facility-Administered Encounter Medications as of 04/10/2024  Medication   denosumab  (PROLIA ) injection 60 mg    Review of Systems:  Review of Systems  Constitutional:  Negative for activity change and appetite change.  HENT: Negative.    Respiratory:  Negative for cough and shortness of breath.   Cardiovascular:  Negative for leg swelling.  Gastrointestinal:  Negative for constipation.  Genitourinary: Negative.   Musculoskeletal:  Positive for gait problem. Negative for arthralgias and myalgias.  Skin: Negative.   Neurological:  Negative for dizziness and weakness.  Psychiatric/Behavioral:  Negative for confusion, dysphoric mood and sleep disturbance.     Health Maintenance  Topic Date Due   Medicare Annual Wellness (AWV)  07/11/2023   Colonoscopy  02/03/2026 (Originally 07/11/2020)   COVID-19 Vaccine (7 - Mixed Product risk 2025-26 season) 05/27/2024   DTaP/Tdap/Td (5 - Td or Tdap) 03/24/2032   Pneumococcal Vaccine: 50+ Years  Completed   Influenza Vaccine  Completed   Bone Density Scan  Completed   Zoster Vaccines- Shingrix  Completed    Meningococcal B Vaccine  Aged Out   Hepatitis B Vaccines 19-59 Average Risk  Discontinued    Physical Exam: Vitals:   04/10/24 0836  BP: 128/78  Pulse: 63  Resp: 18  Temp: 98 F (36.7 C)  SpO2: 93%  Weight: 156 lb (70.8 kg)  Height: 5' 3 (1.6 m)   Body mass index is 27.63 kg/m. Physical Exam Vitals reviewed.  Constitutional:      Appearance: Normal appearance.  HENT:     Head: Normocephalic.     Nose: Nose normal.     Mouth/Throat:     Mouth: Mucous membranes are moist.     Pharynx: Oropharynx is clear.  Eyes:     Pupils: Pupils are equal, round, and reactive to light.  Cardiovascular:     Rate and Rhythm: Normal rate and regular rhythm.     Pulses: Normal pulses.     Heart sounds: Normal heart sounds. No murmur heard. Pulmonary:     Effort: Pulmonary effort is normal.     Breath sounds: Normal breath sounds.  Abdominal:     General: Abdomen is flat. Bowel sounds are normal.     Palpations: Abdomen is soft.  Musculoskeletal:        General: No swelling.     Cervical back: Neck supple.     Comments: Left Arm in Wrapped in the Compression dressing  Right Hip  Slight pain with Abduction Also points pain more in the back and in the lateral side of hip No pain with straight leg raising  Skin:    General: Skin is warm.  Neurological:     General: No focal deficit present.     Mental Status: She is alert and oriented to person, place, and time.  Psychiatric:        Mood and Affect: Mood normal.        Thought Content: Thought content normal.     Labs reviewed: Basic Metabolic Panel: Recent Labs    11/24/23 1317 03/18/24 0815 04/08/24 0842  NA 140 139 139  K 4.3 3.9 4.4  CL 103 105 105  CO2 22 28 27   GLUCOSE 73 101* 95  BUN 16 15 17   CREATININE 0.57 0.46* 0.58*  CALCIUM  9.2 8.7 8.6  TSH 1.400 5.90* 2.62  Liver Function Tests: Recent Labs    11/24/23 1317 03/18/24 0815 04/08/24 0842  AST 17 13 15   ALT 14 13 14   ALKPHOS 63  --   --    BILITOT 0.4 0.5 0.5  PROT 6.0 6.1 5.9*  ALBUMIN 4.3  --   --    No results for input(s): LIPASE, AMYLASE in the last 8760 hours. No results for input(s): AMMONIA in the last 8760 hours. CBC: Recent Labs    09/07/23 0807 11/24/23 1317 03/18/24 0815 04/08/24 0842  WBC 6.2 7.3 7.1 6.6  NEUTROABS 4,179  --  4,736 4,343  HGB 13.3 13.2 13.0 13.0  HCT 39.4 39.7 39.2 38.6  MCV 98.5 101* 99.7 99.7  PLT 194 227 213 216   Lipid Panel: Recent Labs    09/07/23 0807 03/18/24 0815 04/08/24 0842  CHOL 131 140 134  HDL 52 58 52  LDLCALC 61 61 61  TRIG 96 131 124  CHOLHDL 2.5 2.4 2.6   Lab Results  Component Value Date   HGBA1C 5.8 (H) 03/18/2024    Procedures since last visit: No results found.  Assessment/Plan   Assessment and Plan    Right hip bursitis Chronic right hip pain with weakness and balance issues, likely due to bursitis. Pain improves with movement and is relieved by Tylenol . Differential includes arthritis, but bursitis is more likely given the history and symptoms. - Prescribed meloxicam  15 tablets, take one daily for 7 days with food. - Advised to continue Tylenol  as needed for pain. - Instructed trainer to avoid stretching the right hip. - If symptoms persist after 2-3 weeks, will consider referral to orthopedics for possible injection or x-ray.  Coronary artery disease No current chest pain. Advised to avoid NSAIDs long-term due to cardiac history, but a short course of meloxicam  is considered safe for 7 days. - Continue current cardiac management.  Lymphedema of left upper extremity Chronic lymphedema in the left upper extremity, likely secondary to previous Mastectomy Swelling persists despite elevation.  Peripheral neuropathy Symptoms are well-managed with gabapentin  and B12 supplementation. Occasional stabbing sensations noted, but overall stability is maintained. - Continue gabapentin  and B12 supplementation.  Vitamin B12 deficiency B12  levels have improved but remain slightly low at 350. Continued supplementation is recommended to maintain levels above 400. - Continue B12 supplementation.  Primary insomnia Insomnia is well-controlled with trazodone , gabapentin , and melatonin. Current regimen is effective. - Continue current sleep regimen with trazodone , gabapentin , and melatonin.      Senile osteoporosis Prolia  per [facility]  Labs/tests ordered:   Next appt:  05/16/2024        [1] No Known Allergies  "

## 2024-04-11 ENCOUNTER — Ambulatory Visit: Payer: Self-pay | Admitting: Rehabilitation

## 2024-04-11 ENCOUNTER — Encounter: Payer: Self-pay | Admitting: Rehabilitation

## 2024-04-11 ENCOUNTER — Encounter: Payer: Self-pay | Admitting: Internal Medicine

## 2024-04-11 DIAGNOSIS — C50412 Malignant neoplasm of upper-outer quadrant of left female breast: Secondary | ICD-10-CM

## 2024-04-11 DIAGNOSIS — I972 Postmastectomy lymphedema syndrome: Secondary | ICD-10-CM

## 2024-04-11 DIAGNOSIS — Z483 Aftercare following surgery for neoplasm: Secondary | ICD-10-CM

## 2024-04-11 NOTE — Therapy (Signed)
 " OUTPATIENT PHYSICAL THERAPY  UPPER EXTREMITY ONCOLOGY   Patient Name: Jessica [name] MRN: [id number] DOB:Jun 12, 1940, 84 y.o., female Today's Date: 04/11/2024  END OF SESSION:  PT End of Session - 04/11/24 1239     Visit Number 2    Number of Visits 13    Date for Recertification  05/21/24    PT Start Time 1100    PT Stop Time 1153    PT Time Calculation (min) 53 min    Activity Tolerance Patient tolerated treatment well    Behavior During Therapy [facility] for tasks assessed/performed           Past Medical History:  Diagnosis Date   Anemia 12/2015   Breast cancer (HCC)    Bronchitis, chronic (HCC)    in her 95s   Cancer (HCC)    breast   Cataract    Chronic kidney disease    in 20s had nephritis   Closed fracture of right distal humerus    right   Coronary artery disease    Elevated Lp(a)    Hand, foot and mouth disease (HFMD)    Hearing loss 05/03/2016   High cholesterol    Hypothyroidism 2008   Lymphedema of left arm    Myocardial infarction (HCC)    Neuropathy    toes   Osteopenia    Pericarditis 01/1999   Pneumothorax 01/1999   Thyroid  disorder    Vitamin B 12 deficiency    Past Surgical History:  Procedure Laterality Date   birthmark      removed,left leg   BREAST BIOPSY Left 09/13/2022   US  LT BREAST BX W LOC DEV 1ST LESION IMG BX SPEC US  GUIDE 09/13/2022 [facility] MAMMOGRAPHY   CARDIAC CATHETERIZATION     CATARACT EXTRACTION  2017   CORONARY STENT INTERVENTION N/A 09/01/2020   Procedure: CORONARY STENT INTERVENTION;  Surgeon: Claudene Victory ORN, MD;  Location: [facility]VAS[facility];  Service: Cardiovascular;  Laterality: N/A;   LEFT HEART CATH AND CORONARY ANGIOGRAPHY N/A 09/01/2020   Procedure: LEFT HEART CATH AND CORONARY ANGIOGRAPHY;  Surgeon: Claudene Victory ORN, MD;  Location: [facility]VAS[facility];  Service: Cardiovascular;  Laterality: N/A;   ORIF HUMERUS FRACTURE Right 04/10/2019   Procedure: RIGHT OPEN REDUCTION INTERNAL FIXATION (ORIF) DISTAL HUMERUS FRACTURE  WITH EXTENSION;  Surgeon: Cristy Bonner DASEN, MD;  Location: [facility];  Service: Orthopedics;  Laterality: Right;   PORTACATH PLACEMENT N/A 11/15/2022   Procedure: PORT PLACEMENT WITH ULTRASOUND GUIDANCE;  Surgeon: Vanderbilt Ned, MD;  Location: [facility];  Service: General;  Laterality: N/A;   SIMPLE MASTECTOMY WITH AXILLARY SENTINEL NODE BIOPSY Bilateral 10/18/2022   Procedure: BILATERAL SIMPLE MASTECTOMY;  Surgeon: Vanderbilt Ned, MD;  Location: [facility];  Service: General;  Laterality: Bilateral;  PEC BLOCK   TONSILLECTOMY AND ADENOIDECTOMY  1952?   UMBILICAL HERNIA REPAIR N/A 09/20/2023   Procedure: REPAIR, HERNIA, UMBILICAL, ADULT;  Surgeon: Lyndel Deward PARAS, MD;  Location: [facility];  Service: General;  Laterality: N/A;   Patient Active Problem List   Diagnosis Date Noted   Abnormal CT scan, chest 01/19/2024   Lymphedema 01/19/2024   Renal cyst, acquired, right 01/19/2024   Blurry vision, bilateral 08/30/2023   Paresthesia 08/30/2023   Malignant neoplasm of upper-outer quadrant of left breast in female, estrogen receptor negative (HCC) 09/21/2022   CAD (coronary artery disease) 04/08/2022   Swelling of left foot 04/07/2022   Left hand fracture, sequela 04/07/2022   Depression with anxiety  04/07/2022   NSTEMI (non-ST elevated myocardial infarction) (HCC) 09/01/2020   Closed fracture of right distal humerus 04/10/2019   Dyspnea 05/09/2018   Vitamin B 12 deficiency 05/09/2018   Cough 03/06/2018   Senile osteoporosis 11/20/2017   Iron deficiency 04/26/2017   Lichen sclerosus 04/26/2017   External hemorrhoid 04/26/2017   Weakness of left leg 06/07/2016   Balance disorder 05/24/2016   Memory deficit 05/24/2016   Anemia 05/19/2016   Hearing loss 05/03/2016   Hypothyroidism 07/24/2013   Bradycardia on ECG 07/24/2013   Numbness 08/07/2012   Pure hypercholesterolemia 12/29/2006    PCP: Fredia Bring, MD  REFERRING PROVIDER: Mackey Chad, MD  REFERRING  DIAG: Lymphedema   THERAPY DIAG:  Aftercare following surgery for neoplasm  Malignant neoplasm of upper-outer quadrant of left breast in female, estrogen receptor negative (HCC)  Postmastectomy lymphedema syndrome  ONSET DATE: 2024  Rationale for Evaluation and Treatment: Rehabilitation  SUBJECTIVE:                                                                                                                                                                                           SUBJECTIVE STATEMENT: I did okay with it.  I took it off at 7am.   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 She underwent a bilateral mastectomy and left sentinel node biopsy (7 negative nodes) on 10/18/2022. It is triple negative with a Ki67 of 5%. She has some cardiac history and had an elbow fracture in 2022 resulting in surgery where pins were placed.  Completed radiation and chemo.    PAIN:  Are you having pain? Yes Rt hip and groin due to OA 3/10 I took some Tylenol   PRECAUTIONS: None  RED FLAGS: None   WEIGHT BEARING RESTRICTIONS: No  FALLS:  Has patient fallen in last 6 months? No  LIVING ENVIRONMENT: Lives with: lives alone [facility]  Lives in: House/apartment Has following equipment at home: Single point cane and Environmental Consultant - 2 wheeled  OCCUPATION: retired  LEISURE: walking, trainer   HAND DOMINANCE: right   PRIOR LEVEL OF FUNCTION: Independent  PATIENT GOALS: learn what to do for the lymphedema    OBJECTIVE: Note: Objective measures were completed at Evaluation unless otherwise noted.  COGNITION: Overall cognitive status: Within functional limits for tasks assessed   PALPATION: +1 pitting to elbow especially along ulnar border   OBSERVATIONS / OTHER ASSESSMENTS: edema noted mainly in the hand and forearm, wearing gauntlet and sleeve circular knit   UPPER EXTREMITY AROM/PROM:    A/PROM RIGHT   eval  RIGHT 11/14/2022  Shoulder extension 63 60  Shoulder flexion 128 110  Shoulder abduction 137 136  Shoulder internal rotation 68 75  Shoulder external rotation 73 75                          (Blank rows = not tested)   A/PROM LEFT   eval LEFT 11/14/2022  Shoulder extension 53 49  Shoulder flexion 135 109  Shoulder abduction 146 114  Shoulder internal rotation 74 77  Shoulder external rotation 78 70                          (Blank rows = not tested)     UPPER EXTREMITY STRENGTH: WFL   LYMPHEDEMA ASSESSMENTS:    LANDMARK RIGHT   eval RIGHT 11/14/2022 04/09/24  15cm proximal to olecranon process   31.3  10 cm proximal to olecranon process  Not entered due pt was not originally having nodes removed 27.6 29.8  Olecranon process   24.3 27.4  15 cm proximal to ulnar styloid process   23.3  10 cm proximal to ulnar styloid process   20.1 20.2  Just proximal to ulnar styloid process   15.1 16  Across hand at thumb web space   16.7 17.9  At base of 2nd digit   6.1 6.4  (Blank rows = not tested)   LANDMARK LEFT   eval LEFT 11/14/2022 04/09/24 04/11/24  Axilla       15cm proximal to olecranon process   32.5   10 cm proximal to olecranon process   Not entered due pt was not originally having nodes removed 26.4 28   Olecranon process   23.4 26   15cm proximal to olecranon process   24.6   10 cm proximal to ulnar styloid process   19.5 20.8   Just proximal to ulnar styloid process   13.9 16.3   Across hand at thumb web space   16.4 19 18   At base of 2nd digit   6.1 7.3 7.1  (Blank rows = not tested)   L-DEX LYMPHEDEMA SCREENING: LDEX is now increased by 12.1 compared to 22.1 as of less than 2 weeks ago.  The only difference may be that she is not exercising with her trainer due to the snow.   LLIS:  5.88                                                                                                                           TREATMENT DATE:  04/11/24 Started MLD:  education on reasons and anatomy and physiology during treatment: In supine: Short neck, 5 diaphragmatic breaths, bil axillary nodes and establishment of interaxillary pathway, L inguinal nodes and establishment of axilloinguinal pathway, then L UE working proximal to distal, moving fluid from upper inner arm outwards, and doing both sides of forearm moving fluid towards pathways spending extra time in  any areas of fibrosis then retracing all steps   Applied bandage:  eucerin lotion, tg soft medium, fingers 1-5, artiflex from wrist to axilla, added 1/4 foam black to the back of the hand, 6cm hand, and 1 10cm arm bandage.  04/09/24 Eval for new swelling performed Edu on POC and bandaging as well as flat knit vs circular knit Measured pt for medi mondi sleeve and she fits well in a size 2 sleeve and glove so this will be ordered from verse and she will consider getting a custom for a different color at some point.   Applied bandage:  eucerin lotion, tg soft medium, fingers 1-5, artiflex from wrist to axilla, 6cm hand, and 1 10cm arm bandage to start with lower pressure and test tolerance. Edu on when to remove, showering, washing, and remedial exercises.      PATIENT EDUCATION:  Education details: per today's note Person educated: Patient Education method: Programmer, Multimedia, Demonstration, and Verbal cues Education comprehension: verbalized understanding, returned demonstration, and needs further education  HOME EXERCISE PROGRAM: Remedial exercise  ASSESSMENT:  CLINICAL IMPRESSION: Continued POC with initiation of MLD.  Continued education throughout on stages of lymphedema and reasons for each part of treatment.   OBJECTIVE IMPAIRMENTS: decreased knowledge of condition, decreased knowledge of use of DME, and increased edema.   ACTIVITY LIMITATIONS: carrying and lifting  PARTICIPATION LIMITATIONS: community activity  PERSONAL FACTORS: Age, Fitness, and 1-2 comorbidities: radiation, SLNB are  also affecting patient's functional outcome.   REHAB POTENTIAL: Excellent  CLINICAL DECISION MAKING: Stable/uncomplicated  EVALUATION COMPLEXITY: Low  GOALS: Goals reviewed with patient? Yes  SHORT TERM GOALS: Target date: 04/09/24  Pt will be introduced to CDT and when to remove the bandages Baseline: Goal status: MET  2.  Pt will be measured for an off the shelf flat knit sleeve and glove  Baseline:  Goal status: MET   LONG TERM GOALS: Target date: 05/21/24  Pt will transition to daytime flat knit garments after initial reduction Baseline:  Goal status: INITIAL  2.  Pt will be ind with self MLD and show a lymphedema compression pump Baseline:  Goal status: INITIAL  3.  Pt will be educated on risk reduction for people with lymphedema Baseline:  Goal status: INITIAL   PLAN:  PT FREQUENCY: 2x/week  PT DURATION: 6 weeks  PLANNED INTERVENTIONS: 97164- PT Re-evaluation, 97110-Therapeutic exercises, 97530- Therapeutic activity, 97112- Neuromuscular re-education, 97535- Self Care, [id number]- Manual therapy, 97760- Orthotic Initial, [phone]- Orthotic/Prosthetic subsequent, Patient/Family education, Balance training, Joint mobilization, Compression bandaging, Therapeutic exercises, Therapeutic activity, Neuromuscular re-education, and Gait training  PLAN FOR NEXT SESSION: how was bandage?, add another if needed, start MLD and start to educate on self MLD,  pt thinks she will not be likely to learn self bandaging.   Larue Saddie SAUNDERS, PT 04/11/2024, 12:40 PM  "

## 2024-04-16 ENCOUNTER — Ambulatory Visit: Admitting: Rehabilitation

## 2024-04-18 ENCOUNTER — Ambulatory Visit: Admitting: Rehabilitation

## 2024-04-23 ENCOUNTER — Ambulatory Visit: Admitting: Rehabilitation

## 2024-04-25 ENCOUNTER — Ambulatory Visit: Admitting: Rehabilitation

## 2024-04-30 ENCOUNTER — Ambulatory Visit: Admitting: Rehabilitation

## 2024-05-02 ENCOUNTER — Ambulatory Visit: Admitting: Rehabilitation

## 2024-05-16 ENCOUNTER — Ambulatory Visit: Payer: Self-pay

## 2024-05-31 ENCOUNTER — Encounter

## 2024-06-25 ENCOUNTER — Ambulatory Visit: Admitting: Cardiovascular Disease

## 2024-07-10 ENCOUNTER — Encounter: Admitting: Internal Medicine

## 2024-12-11 ENCOUNTER — Ambulatory Visit: Admitting: Hematology and Oncology
